# Patient Record
Sex: Female | Born: 1950 | Race: White | Hispanic: No | Marital: Married | Smoking: Never smoker
Health system: Southern US, Community
[De-identification: ages and names within clinical notes are randomized; demographics above are authoritative.]

## PROBLEM LIST (undated history)

## (undated) DIAGNOSIS — Z7952 Long term (current) use of systemic steroids: Secondary | ICD-10-CM

## (undated) DIAGNOSIS — M5136 Other intervertebral disc degeneration, lumbar region: Secondary | ICD-10-CM

## (undated) DIAGNOSIS — G629 Polyneuropathy, unspecified: Secondary | ICD-10-CM

## (undated) DIAGNOSIS — M51369 Other intervertebral disc degeneration, lumbar region without mention of lumbar back pain or lower extremity pain: Secondary | ICD-10-CM

## (undated) DIAGNOSIS — H9190 Unspecified hearing loss, unspecified ear: Secondary | ICD-10-CM

## (undated) DIAGNOSIS — T753XXA Motion sickness, initial encounter: Secondary | ICD-10-CM

## (undated) DIAGNOSIS — N390 Urinary tract infection, site not specified: Secondary | ICD-10-CM

## (undated) DIAGNOSIS — H8109 Meniere's disease, unspecified ear: Secondary | ICD-10-CM

## (undated) DIAGNOSIS — M1990 Unspecified osteoarthritis, unspecified site: Secondary | ICD-10-CM

## (undated) DIAGNOSIS — K654 Sclerosing mesenteritis: Secondary | ICD-10-CM

## (undated) DIAGNOSIS — M069 Rheumatoid arthritis, unspecified: Secondary | ICD-10-CM

## (undated) DIAGNOSIS — I10 Essential (primary) hypertension: Secondary | ICD-10-CM

## (undated) DIAGNOSIS — K5090 Crohn's disease, unspecified, without complications: Secondary | ICD-10-CM

## (undated) DIAGNOSIS — T884XXA Failed or difficult intubation, initial encounter: Secondary | ICD-10-CM

## (undated) DIAGNOSIS — D849 Immunodeficiency, unspecified: Secondary | ICD-10-CM

## (undated) DIAGNOSIS — E876 Hypokalemia: Secondary | ICD-10-CM

## (undated) DIAGNOSIS — M810 Age-related osteoporosis without current pathological fracture: Secondary | ICD-10-CM

## (undated) DIAGNOSIS — J189 Pneumonia, unspecified organism: Secondary | ICD-10-CM

## (undated) DIAGNOSIS — E119 Type 2 diabetes mellitus without complications: Secondary | ICD-10-CM

## (undated) DIAGNOSIS — N12 Tubulo-interstitial nephritis, not specified as acute or chronic: Secondary | ICD-10-CM

## (undated) DIAGNOSIS — I872 Venous insufficiency (chronic) (peripheral): Secondary | ICD-10-CM

## (undated) DIAGNOSIS — H905 Unspecified sensorineural hearing loss: Secondary | ICD-10-CM

## (undated) DIAGNOSIS — R519 Headache, unspecified: Secondary | ICD-10-CM

## (undated) DIAGNOSIS — F419 Anxiety disorder, unspecified: Secondary | ICD-10-CM

## (undated) DIAGNOSIS — I2510 Atherosclerotic heart disease of native coronary artery without angina pectoris: Secondary | ICD-10-CM

## (undated) DIAGNOSIS — G4733 Obstructive sleep apnea (adult) (pediatric): Secondary | ICD-10-CM

## (undated) DIAGNOSIS — D509 Iron deficiency anemia, unspecified: Secondary | ICD-10-CM

## (undated) DIAGNOSIS — F32A Depression, unspecified: Secondary | ICD-10-CM

## (undated) DIAGNOSIS — G5793 Unspecified mononeuropathy of bilateral lower limbs: Secondary | ICD-10-CM

## (undated) DIAGNOSIS — Z7960 Long term (current) use of unspecified immunomodulators and immunosuppressants: Secondary | ICD-10-CM

## (undated) DIAGNOSIS — M5030 Other cervical disc degeneration, unspecified cervical region: Secondary | ICD-10-CM

## (undated) DIAGNOSIS — R0609 Other forms of dyspnea: Secondary | ICD-10-CM

## (undated) DIAGNOSIS — G709 Myoneural disorder, unspecified: Secondary | ICD-10-CM

## (undated) DIAGNOSIS — Z79899 Other long term (current) drug therapy: Secondary | ICD-10-CM

## (undated) DIAGNOSIS — Z8659 Personal history of other mental and behavioral disorders: Secondary | ICD-10-CM

## (undated) DIAGNOSIS — K760 Fatty (change of) liver, not elsewhere classified: Secondary | ICD-10-CM

## (undated) DIAGNOSIS — E782 Mixed hyperlipidemia: Secondary | ICD-10-CM

## (undated) DIAGNOSIS — R0600 Dyspnea, unspecified: Secondary | ICD-10-CM

## (undated) DIAGNOSIS — Z87442 Personal history of urinary calculi: Secondary | ICD-10-CM

## (undated) DIAGNOSIS — R911 Solitary pulmonary nodule: Secondary | ICD-10-CM

## (undated) DIAGNOSIS — M359 Systemic involvement of connective tissue, unspecified: Secondary | ICD-10-CM

## (undated) DIAGNOSIS — D649 Anemia, unspecified: Secondary | ICD-10-CM

## (undated) DIAGNOSIS — R29898 Other symptoms and signs involving the musculoskeletal system: Secondary | ICD-10-CM

## (undated) DIAGNOSIS — S52539A Colles' fracture of unspecified radius, initial encounter for closed fracture: Secondary | ICD-10-CM

## (undated) DIAGNOSIS — R197 Diarrhea, unspecified: Secondary | ICD-10-CM

## (undated) DIAGNOSIS — M170 Bilateral primary osteoarthritis of knee: Secondary | ICD-10-CM

## (undated) DIAGNOSIS — G2581 Restless legs syndrome: Secondary | ICD-10-CM

## (undated) DIAGNOSIS — Z8619 Personal history of other infectious and parasitic diseases: Secondary | ICD-10-CM

## (undated) DIAGNOSIS — E0590 Thyrotoxicosis, unspecified without thyrotoxic crisis or storm: Secondary | ICD-10-CM

## (undated) DIAGNOSIS — H35341 Macular cyst, hole, or pseudohole, right eye: Secondary | ICD-10-CM

## (undated) DIAGNOSIS — I899 Noninfective disorder of lymphatic vessels and lymph nodes, unspecified: Secondary | ICD-10-CM

## (undated) DIAGNOSIS — R42 Dizziness and giddiness: Secondary | ICD-10-CM

## (undated) DIAGNOSIS — K219 Gastro-esophageal reflux disease without esophagitis: Secondary | ICD-10-CM

## (undated) DIAGNOSIS — E1140 Type 2 diabetes mellitus with diabetic neuropathy, unspecified: Secondary | ICD-10-CM

## (undated) DIAGNOSIS — Z972 Presence of dental prosthetic device (complete) (partial): Secondary | ICD-10-CM

## (undated) DIAGNOSIS — E538 Deficiency of other specified B group vitamins: Secondary | ICD-10-CM

## (undated) HISTORY — PX: KYPHOPLASTY: SHX5884

## (undated) HISTORY — PX: OOPHORECTOMY: SHX86

## (undated) HISTORY — DX: Anemia, unspecified: D64.9

## (undated) HISTORY — PX: ESOPHAGOGASTRODUODENOSCOPY: SHX1529

## (undated) HISTORY — PX: APPENDECTOMY: SHX54

## (undated) HISTORY — PX: CERVICAL SPINE SURGERY: SHX589

## (undated) HISTORY — PX: FRACTURE SURGERY: SHX138

## (undated) HISTORY — PX: WRIST FRACTURE SURGERY: SHX121

## (undated) HISTORY — PX: ABDOMINAL HYSTERECTOMY: SHX81

## (undated) HISTORY — PX: MEDIAL PARTIAL KNEE REPLACEMENT: SHX5965

## (undated) HISTORY — DX: Crohn's disease, unspecified, without complications: K50.90

## (undated) HISTORY — PX: OTHER SURGICAL HISTORY: SHX169

## (undated) HISTORY — PX: COLONOSCOPY: SHX174

## (undated) HISTORY — PX: TONSILLECTOMY: SUR1361

## (undated) HISTORY — PX: BACK SURGERY: SHX140

## (undated) HISTORY — PX: CHOLECYSTECTOMY: SHX55

## (undated) HISTORY — PX: UPPER GI ENDOSCOPY: SHX6162

---

## 1993-08-15 HISTORY — PX: TONSILLECTOMY: SUR1361

## 1997-08-15 HISTORY — PX: COLON SURGERY: SHX602

## 2000-11-30 ENCOUNTER — Ambulatory Visit (HOSPITAL_COMMUNITY): Admission: RE | Admit: 2000-11-30 | Discharge: 2000-11-30 | Payer: Self-pay | Admitting: Neurological Surgery

## 2000-11-30 ENCOUNTER — Encounter: Payer: Self-pay | Admitting: Neurological Surgery

## 2000-12-14 ENCOUNTER — Inpatient Hospital Stay (HOSPITAL_COMMUNITY): Admission: RE | Admit: 2000-12-14 | Discharge: 2000-12-15 | Payer: Self-pay | Admitting: Neurological Surgery

## 2000-12-14 ENCOUNTER — Encounter: Payer: Self-pay | Admitting: Neurological Surgery

## 2001-01-05 ENCOUNTER — Encounter: Admission: RE | Admit: 2001-01-05 | Discharge: 2001-01-05 | Payer: Self-pay | Admitting: Neurological Surgery

## 2001-01-05 ENCOUNTER — Encounter: Payer: Self-pay | Admitting: Neurological Surgery

## 2001-08-15 DIAGNOSIS — M5030 Other cervical disc degeneration, unspecified cervical region: Secondary | ICD-10-CM

## 2001-08-15 HISTORY — DX: Other cervical disc degeneration, unspecified cervical region: M50.30

## 2002-08-15 HISTORY — PX: DILATION AND CURETTAGE OF UTERUS: SHX78

## 2002-10-28 ENCOUNTER — Encounter: Admission: RE | Admit: 2002-10-28 | Discharge: 2002-10-28 | Payer: Self-pay | Admitting: Neurological Surgery

## 2002-10-28 ENCOUNTER — Encounter: Payer: Self-pay | Admitting: Neurological Surgery

## 2003-08-16 HISTORY — PX: ABDOMINAL HYSTERECTOMY: SHX81

## 2004-05-15 ENCOUNTER — Ambulatory Visit: Payer: Self-pay | Admitting: Internal Medicine

## 2004-06-15 ENCOUNTER — Ambulatory Visit: Payer: Self-pay | Admitting: Internal Medicine

## 2004-07-09 ENCOUNTER — Ambulatory Visit: Payer: Self-pay | Admitting: Rheumatology

## 2004-07-15 ENCOUNTER — Ambulatory Visit: Payer: Self-pay | Admitting: Internal Medicine

## 2004-08-15 ENCOUNTER — Ambulatory Visit: Payer: Self-pay | Admitting: Internal Medicine

## 2004-09-15 ENCOUNTER — Ambulatory Visit: Payer: Self-pay | Admitting: Internal Medicine

## 2004-10-13 ENCOUNTER — Ambulatory Visit: Payer: Self-pay | Admitting: Internal Medicine

## 2004-11-13 ENCOUNTER — Ambulatory Visit: Payer: Self-pay | Admitting: Internal Medicine

## 2004-12-13 ENCOUNTER — Ambulatory Visit: Payer: Self-pay | Admitting: Internal Medicine

## 2005-01-13 ENCOUNTER — Ambulatory Visit: Payer: Self-pay | Admitting: Internal Medicine

## 2005-02-12 ENCOUNTER — Ambulatory Visit: Payer: Self-pay | Admitting: Internal Medicine

## 2005-02-25 ENCOUNTER — Ambulatory Visit: Payer: Self-pay | Admitting: Otolaryngology

## 2005-03-15 ENCOUNTER — Ambulatory Visit: Payer: Self-pay | Admitting: Internal Medicine

## 2005-04-15 ENCOUNTER — Ambulatory Visit: Payer: Self-pay | Admitting: Internal Medicine

## 2005-05-15 ENCOUNTER — Ambulatory Visit: Payer: Self-pay | Admitting: Internal Medicine

## 2005-06-15 ENCOUNTER — Ambulatory Visit: Payer: Self-pay | Admitting: Internal Medicine

## 2005-07-11 ENCOUNTER — Ambulatory Visit: Payer: Self-pay

## 2005-07-15 ENCOUNTER — Ambulatory Visit: Payer: Self-pay | Admitting: Internal Medicine

## 2005-08-15 ENCOUNTER — Ambulatory Visit: Payer: Self-pay | Admitting: Internal Medicine

## 2005-09-15 ENCOUNTER — Ambulatory Visit: Payer: Self-pay | Admitting: Internal Medicine

## 2005-10-14 ENCOUNTER — Ambulatory Visit: Payer: Self-pay | Admitting: Unknown Physician Specialty

## 2005-10-15 ENCOUNTER — Ambulatory Visit: Payer: Self-pay | Admitting: Internal Medicine

## 2005-11-13 ENCOUNTER — Ambulatory Visit: Payer: Self-pay | Admitting: Internal Medicine

## 2005-12-13 ENCOUNTER — Ambulatory Visit: Payer: Self-pay | Admitting: Internal Medicine

## 2006-01-04 ENCOUNTER — Ambulatory Visit: Payer: Self-pay

## 2006-01-11 ENCOUNTER — Ambulatory Visit: Payer: Self-pay | Admitting: Internal Medicine

## 2006-01-12 ENCOUNTER — Ambulatory Visit: Payer: Self-pay | Admitting: Internal Medicine

## 2006-01-13 ENCOUNTER — Ambulatory Visit: Payer: Self-pay | Admitting: Internal Medicine

## 2006-02-12 ENCOUNTER — Ambulatory Visit: Payer: Self-pay | Admitting: Internal Medicine

## 2006-03-15 ENCOUNTER — Ambulatory Visit: Payer: Self-pay | Admitting: Internal Medicine

## 2006-04-15 ENCOUNTER — Ambulatory Visit: Payer: Self-pay | Admitting: Internal Medicine

## 2006-05-15 ENCOUNTER — Ambulatory Visit: Payer: Self-pay | Admitting: Internal Medicine

## 2006-06-15 ENCOUNTER — Ambulatory Visit: Payer: Self-pay | Admitting: Internal Medicine

## 2006-07-15 ENCOUNTER — Ambulatory Visit: Payer: Self-pay | Admitting: Internal Medicine

## 2006-07-18 ENCOUNTER — Ambulatory Visit: Payer: Self-pay | Admitting: Urology

## 2006-08-15 ENCOUNTER — Ambulatory Visit: Payer: Self-pay | Admitting: Internal Medicine

## 2006-08-15 HISTORY — PX: ELBOW FRACTURE SURGERY: SHX616

## 2006-09-20 ENCOUNTER — Ambulatory Visit: Payer: Self-pay | Admitting: Unknown Physician Specialty

## 2006-09-27 ENCOUNTER — Ambulatory Visit: Payer: Self-pay | Admitting: Unknown Physician Specialty

## 2006-10-11 ENCOUNTER — Ambulatory Visit: Payer: Self-pay | Admitting: Internal Medicine

## 2006-11-01 ENCOUNTER — Ambulatory Visit: Payer: Self-pay | Admitting: Unknown Physician Specialty

## 2006-11-07 ENCOUNTER — Ambulatory Visit: Payer: Self-pay | Admitting: Internal Medicine

## 2006-11-20 ENCOUNTER — Ambulatory Visit: Payer: Self-pay | Admitting: Unknown Physician Specialty

## 2006-11-28 ENCOUNTER — Ambulatory Visit: Payer: Self-pay | Admitting: Unknown Physician Specialty

## 2006-12-05 ENCOUNTER — Ambulatory Visit: Payer: Self-pay | Admitting: Internal Medicine

## 2006-12-14 ENCOUNTER — Ambulatory Visit: Payer: Self-pay | Admitting: Internal Medicine

## 2007-01-22 ENCOUNTER — Ambulatory Visit: Payer: Self-pay | Admitting: Urology

## 2007-01-30 ENCOUNTER — Ambulatory Visit: Payer: Self-pay | Admitting: Internal Medicine

## 2007-02-13 ENCOUNTER — Ambulatory Visit: Payer: Self-pay | Admitting: Internal Medicine

## 2007-03-16 ENCOUNTER — Ambulatory Visit: Payer: Self-pay | Admitting: Internal Medicine

## 2007-04-16 ENCOUNTER — Ambulatory Visit: Payer: Self-pay | Admitting: Internal Medicine

## 2007-05-16 ENCOUNTER — Ambulatory Visit: Payer: Self-pay | Admitting: Internal Medicine

## 2007-06-16 ENCOUNTER — Ambulatory Visit: Payer: Self-pay | Admitting: Internal Medicine

## 2007-07-16 ENCOUNTER — Ambulatory Visit: Payer: Self-pay | Admitting: Internal Medicine

## 2007-08-22 ENCOUNTER — Ambulatory Visit: Payer: Self-pay | Admitting: Urology

## 2007-08-29 ENCOUNTER — Ambulatory Visit: Payer: Self-pay | Admitting: Urology

## 2007-09-11 ENCOUNTER — Ambulatory Visit: Payer: Self-pay | Admitting: Internal Medicine

## 2007-09-25 ENCOUNTER — Inpatient Hospital Stay: Payer: Self-pay | Admitting: Unknown Physician Specialty

## 2007-10-10 ENCOUNTER — Ambulatory Visit: Payer: Self-pay | Admitting: Internal Medicine

## 2007-10-14 ENCOUNTER — Ambulatory Visit: Payer: Self-pay | Admitting: Internal Medicine

## 2007-11-28 ENCOUNTER — Ambulatory Visit: Payer: Self-pay | Admitting: Unknown Physician Specialty

## 2007-12-04 ENCOUNTER — Ambulatory Visit: Payer: Self-pay | Admitting: Internal Medicine

## 2007-12-14 ENCOUNTER — Ambulatory Visit: Payer: Self-pay | Admitting: Internal Medicine

## 2008-01-22 ENCOUNTER — Ambulatory Visit: Payer: Self-pay | Admitting: Urology

## 2008-02-05 ENCOUNTER — Ambulatory Visit: Payer: Self-pay | Admitting: Internal Medicine

## 2008-02-13 ENCOUNTER — Ambulatory Visit: Payer: Self-pay | Admitting: Internal Medicine

## 2008-03-04 ENCOUNTER — Ambulatory Visit: Payer: Self-pay | Admitting: Internal Medicine

## 2008-03-15 ENCOUNTER — Ambulatory Visit: Payer: Self-pay | Admitting: Internal Medicine

## 2008-04-15 ENCOUNTER — Ambulatory Visit: Payer: Self-pay | Admitting: Internal Medicine

## 2008-04-29 ENCOUNTER — Ambulatory Visit: Payer: Self-pay | Admitting: Internal Medicine

## 2008-05-14 ENCOUNTER — Ambulatory Visit: Payer: Self-pay | Admitting: Internal Medicine

## 2008-05-15 ENCOUNTER — Ambulatory Visit: Payer: Self-pay | Admitting: Internal Medicine

## 2008-06-15 ENCOUNTER — Ambulatory Visit: Payer: Self-pay | Admitting: Internal Medicine

## 2008-07-15 ENCOUNTER — Ambulatory Visit: Payer: Self-pay | Admitting: Internal Medicine

## 2008-08-15 ENCOUNTER — Ambulatory Visit: Payer: Self-pay | Admitting: Internal Medicine

## 2008-09-15 ENCOUNTER — Ambulatory Visit: Payer: Self-pay | Admitting: Internal Medicine

## 2008-10-13 ENCOUNTER — Ambulatory Visit: Payer: Self-pay | Admitting: Internal Medicine

## 2008-11-28 ENCOUNTER — Ambulatory Visit: Payer: Self-pay | Admitting: Unknown Physician Specialty

## 2008-12-09 ENCOUNTER — Ambulatory Visit: Payer: Self-pay | Admitting: Internal Medicine

## 2008-12-13 ENCOUNTER — Ambulatory Visit: Payer: Self-pay | Admitting: Internal Medicine

## 2009-01-02 ENCOUNTER — Ambulatory Visit: Payer: Self-pay | Admitting: Unknown Physician Specialty

## 2009-01-06 ENCOUNTER — Ambulatory Visit: Payer: Self-pay | Admitting: Internal Medicine

## 2009-01-12 ENCOUNTER — Ambulatory Visit: Payer: Self-pay | Admitting: Unknown Physician Specialty

## 2009-01-13 ENCOUNTER — Ambulatory Visit: Payer: Self-pay | Admitting: Internal Medicine

## 2009-01-22 ENCOUNTER — Ambulatory Visit: Payer: Self-pay | Admitting: Urology

## 2009-02-05 ENCOUNTER — Ambulatory Visit: Payer: Self-pay | Admitting: Internal Medicine

## 2009-02-10 ENCOUNTER — Ambulatory Visit: Payer: Self-pay | Admitting: Internal Medicine

## 2009-02-12 ENCOUNTER — Ambulatory Visit: Payer: Self-pay | Admitting: Internal Medicine

## 2009-03-15 ENCOUNTER — Ambulatory Visit: Payer: Self-pay | Admitting: Internal Medicine

## 2009-04-13 ENCOUNTER — Ambulatory Visit: Payer: Self-pay | Admitting: Internal Medicine

## 2009-05-06 ENCOUNTER — Ambulatory Visit: Payer: Self-pay | Admitting: Internal Medicine

## 2009-05-13 ENCOUNTER — Ambulatory Visit: Payer: Self-pay | Admitting: Internal Medicine

## 2009-06-03 ENCOUNTER — Ambulatory Visit: Payer: Self-pay | Admitting: Internal Medicine

## 2009-06-09 ENCOUNTER — Ambulatory Visit: Payer: Self-pay | Admitting: Internal Medicine

## 2009-06-15 ENCOUNTER — Ambulatory Visit: Payer: Self-pay | Admitting: Internal Medicine

## 2009-07-15 ENCOUNTER — Ambulatory Visit: Payer: Self-pay | Admitting: Internal Medicine

## 2009-08-15 ENCOUNTER — Ambulatory Visit: Payer: Self-pay | Admitting: Internal Medicine

## 2009-09-05 ENCOUNTER — Emergency Department: Payer: Self-pay | Admitting: Emergency Medicine

## 2009-09-10 ENCOUNTER — Ambulatory Visit: Payer: Self-pay | Admitting: Internal Medicine

## 2009-09-15 ENCOUNTER — Ambulatory Visit: Payer: Self-pay | Admitting: Internal Medicine

## 2009-10-13 ENCOUNTER — Ambulatory Visit: Payer: Self-pay | Admitting: Internal Medicine

## 2009-12-02 ENCOUNTER — Ambulatory Visit: Payer: Self-pay | Admitting: Unknown Physician Specialty

## 2009-12-08 ENCOUNTER — Ambulatory Visit: Payer: Self-pay | Admitting: Internal Medicine

## 2009-12-13 ENCOUNTER — Ambulatory Visit: Payer: Self-pay | Admitting: Internal Medicine

## 2010-01-02 ENCOUNTER — Encounter: Admission: RE | Admit: 2010-01-02 | Discharge: 2010-01-02 | Payer: Self-pay | Admitting: Neurological Surgery

## 2010-01-13 ENCOUNTER — Ambulatory Visit: Payer: Self-pay | Admitting: Internal Medicine

## 2010-01-26 ENCOUNTER — Ambulatory Visit: Payer: Self-pay | Admitting: Urology

## 2010-01-27 ENCOUNTER — Ambulatory Visit: Payer: Self-pay | Admitting: Pain Medicine

## 2010-02-04 ENCOUNTER — Ambulatory Visit: Payer: Self-pay | Admitting: Pain Medicine

## 2010-02-22 ENCOUNTER — Ambulatory Visit: Payer: Self-pay | Admitting: Pain Medicine

## 2010-11-05 ENCOUNTER — Ambulatory Visit: Payer: Self-pay | Admitting: Urology

## 2010-11-09 ENCOUNTER — Ambulatory Visit: Payer: Self-pay | Admitting: Urology

## 2010-12-06 ENCOUNTER — Ambulatory Visit: Payer: Self-pay | Admitting: Unknown Physician Specialty

## 2010-12-31 NOTE — Op Note (Signed)
[place]  Patient:    Nicole Bass, Nicole Bass                       MRN: [id number] Proc. Date: 12/14/00 Adm. Date:  [id number] Attending:  Clearnce Sorrel                           Operative Report  PREOPERATIVE DIAGNOSES:  C5-6 cervical spondylosis and herniated nucleus pulposus C6-7 with right cervical radiculopathy.  POSTOPERATIVE DIAGNOSES:  C5-6 cervical spondylosis and herniated nucleus pulposus C6-7 with right cervical radiculopathy.  PROCEDURE:  Anterior cervical diskectomy and arthrodesis with structural allografts Synthes fixation C5-6 and C6-7.  SURGEON:  Earleen Newport, M.D.  FIRST ASSISTANT:  Marchia Meiers. Vertell Limber, M.D..  ANESTHESIA:  General endotracheal.  INDICATIONS:  The patient is a 60 year old individual who has had significant neck, shoulder and arm pain on the left hand side.  She has been suffering with this for the past 5 to 6 months and she was advised regarding anterior diskectomy surgery and arthrodesis.  She is taken to the operating room.  DESCRIPTION OF PROCEDURE:  The patient was brought to the operating room, placed on the table in the supine position.  After the smooth induction of general endotracheal anesthesia, her neck was placed in five pounds of Holter traction and was prepped with duraprep and draped in a sterile fashion.  A transverse incision was made on the the left side of the neck at the lower skin fold.  Dissection was taken down through the platysma and the plane between the sternocleidomastoid and the strap muscles was dissected bluntly until the prevertebral space was reached.  The first identifiable disk space was noted to be that of C4-5 and dissection was carried inferiorly to expose C5-6 and C6-7.  A self-retaining Caspar retractor was placed in the wound.  Diskectomy at C5-6 was undertaken and near the disk and was markedly degenerated.  Small uncinate spurs were found, somewhat greater on the  right then on the left.  Subligamentous disk herniation was encountered in this region and this was resected.  The posterolongitudinal ligament was opened from side to side and lateral recesses were decompressed.  In the end a 7 mm round fibular graft was packed with some of the patients own bone and then placed into the interspace.  Attention was turned to C6-7. Here the disk space was noted to be much more sclerotic and the disk much more degenerated and flattened.  Lateral recesses were also quite overgrown and the uncinate processes were quite enlarged.  These were resected with an anspach drill and a 2.3 mm dissecting tool.  The lateral recesses, once decompressed, particularly on the right allowed good egress of the C7 nerve root on this side.  Then a 7 mm round fibular graft was packed with the patients own bone again. The bone was harvested from the uncinate spurs that were resected on either side and placed into the interspace.  Traction was removed and the neck was placed in slight flexion.  A 37 mm small statured plate was then affixed with 6 locking 4 x 14 mm screws. Hemostasis from the soft tissue was obtained. Radiographic confirmation of the placement of the hardware was obtained and the platysma was closed with 3-0 Vicryl in an interrupted fashion.  3-0 Vicryl was used subcuticularly.  Duraprep was used on the skin.  At the closure of the procedure  several small skin tags that the patient had in the region of the neck and wished to be removed were removed with a Metzenbaum scissors and these were similarly dressed. DD:  12/14/00 TD:  12/15/00 Job: [id number]

## 2011-02-24 ENCOUNTER — Ambulatory Visit: Payer: Self-pay | Admitting: Urology

## 2011-04-13 ENCOUNTER — Other Ambulatory Visit: Payer: Self-pay | Admitting: Unknown Physician Specialty

## 2011-08-09 ENCOUNTER — Emergency Department: Payer: Self-pay | Admitting: Emergency Medicine

## 2011-08-16 HISTORY — PX: TOE SURGERY: SHX1073

## 2011-08-24 LAB — CBC
HCT: 34 % — ABNORMAL LOW (ref 35.0–47.0)
HGB: 11.4 g/dL — ABNORMAL LOW (ref 12.0–16.0)
MCH: 30.2 pg (ref 26.0–34.0)
MCHC: 33.6 g/dL (ref 32.0–36.0)
MCV: 90 fL (ref 80–100)
Platelet: 264 10*3/uL (ref 150–440)
RBC: 3.77 10*6/uL — ABNORMAL LOW (ref 3.80–5.20)
RDW: 14.6 % — ABNORMAL HIGH (ref 11.5–14.5)
WBC: 9.1 10*3/uL (ref 3.6–11.0)

## 2011-08-24 LAB — COMPREHENSIVE METABOLIC PANEL
Albumin: 3.3 g/dL — ABNORMAL LOW (ref 3.4–5.0)
Alkaline Phosphatase: 83 U/L (ref 50–136)
Anion Gap: 12 (ref 7–16)
Bilirubin,Total: 0.5 mg/dL (ref 0.2–1.0)
Co2: 25 mmol/L (ref 21–32)
Creatinine: 0.86 mg/dL (ref 0.60–1.30)
EGFR (Non-African Amer.): >60
Glucose: 121 mg/dL — ABNORMAL HIGH (ref 65–99)
Osmolality: 279 (ref 275–301)
Potassium: 3.4 mmol/L — ABNORMAL LOW (ref 3.5–5.1)
SGPT (ALT): 18 U/L
Sodium: 140 mmol/L (ref 136–145)

## 2011-08-24 LAB — URINALYSIS, COMPLETE
Bacteria: NONE SEEN
Bilirubin,UR: NEGATIVE
Blood: NEGATIVE
Glucose,UR: NEGATIVE mg/dL (ref 0–75)
Ketone: NEGATIVE
Leukocyte Esterase: NEGATIVE
Specific Gravity: 1.018 (ref 1.003–1.030)
Squamous Epithelial: <1
WBC UR: 1 /HPF (ref 0–5)

## 2011-08-25 ENCOUNTER — Inpatient Hospital Stay: Payer: Self-pay | Admitting: Internal Medicine

## 2011-08-25 LAB — CLOSTRIDIUM DIFFICILE BY PCR

## 2011-08-25 LAB — SEDIMENTATION RATE: Erythrocyte Sed Rate: 64 mm/hr — ABNORMAL HIGH (ref 0–30)

## 2011-08-25 LAB — WBCS, STOOL

## 2011-08-26 LAB — COMPREHENSIVE METABOLIC PANEL
Albumin: 2.4 g/dL — ABNORMAL LOW (ref 3.4–5.0)
Alkaline Phosphatase: 58 U/L (ref 50–136)
BUN: 2 mg/dL — ABNORMAL LOW (ref 7–18)
Bilirubin,Total: 0.3 mg/dL (ref 0.2–1.0)
Creatinine: 0.76 mg/dL (ref 0.60–1.30)
EGFR (Non-African Amer.): >60
Glucose: 90 mg/dL (ref 65–99)
Osmolality: 286 (ref 275–301)
SGPT (ALT): 14 U/L
Sodium: 146 mmol/L — ABNORMAL HIGH (ref 136–145)
Total Protein: 5.4 g/dL — ABNORMAL LOW (ref 6.4–8.2)

## 2011-08-26 LAB — CBC WITH DIFFERENTIAL/PLATELET
Basophil #: 0 10*3/uL (ref 0.0–0.1)
Eosinophil %: 2.8 %
Lymphocyte #: 0.6 10*3/uL — ABNORMAL LOW (ref 1.0–3.6)
Lymphocyte %: 16.8 %
MCH: 30.3 pg (ref 26.0–34.0)
MCHC: 33.1 g/dL (ref 32.0–36.0)
MCV: 92 fL (ref 80–100)
Monocyte #: 0.4 10*3/uL (ref 0.0–0.7)
Neutrophil %: 68.6 %
Platelet: 158 10*3/uL (ref 150–440)
RBC: 2.81 10*6/uL — ABNORMAL LOW (ref 3.80–5.20)

## 2011-08-27 LAB — COMPREHENSIVE METABOLIC PANEL
Anion Gap: 11 (ref 7–16)
Bilirubin,Total: 0.3 mg/dL (ref 0.2–1.0)
Calcium, Total: 7.6 mg/dL — ABNORMAL LOW (ref 8.5–10.1)
Chloride: 110 mmol/L — ABNORMAL HIGH (ref 98–107)
Co2: 25 mmol/L (ref 21–32)
Creatinine: 0.7 mg/dL (ref 0.60–1.30)
EGFR (African American): >60
EGFR (Non-African Amer.): >60
SGOT(AST): 24 U/L (ref 15–37)
SGPT (ALT): 15 U/L
Sodium: 146 mmol/L — ABNORMAL HIGH (ref 136–145)

## 2011-08-27 LAB — CBC WITH DIFFERENTIAL/PLATELET
Eosinophil #: 0.1 10*3/uL (ref 0.0–0.7)
HCT: 26.8 % — ABNORMAL LOW (ref 35.0–47.0)
HGB: 8.9 g/dL — ABNORMAL LOW (ref 12.0–16.0)
Lymphocyte #: 1 10*3/uL (ref 1.0–3.6)
Lymphocyte %: 24.7 %
MCH: 30.3 pg (ref 26.0–34.0)
MCHC: 33.2 g/dL (ref 32.0–36.0)
Monocyte #: 0.5 10*3/uL (ref 0.0–0.7)
Monocyte %: 12.6 %
Neutrophil %: 59.5 %
Platelet: 157 10*3/uL (ref 150–440)
RDW: 14.2 % (ref 11.5–14.5)
WBC: 4 10*3/uL (ref 3.6–11.0)

## 2011-08-27 LAB — STOOL CULTURE

## 2011-08-29 LAB — CBC WITH DIFFERENTIAL/PLATELET
Basophil #: 0 10*3/uL (ref 0.0–0.1)
Eosinophil #: 0.2 10*3/uL (ref 0.0–0.7)
HGB: 9.2 g/dL — ABNORMAL LOW (ref 12.0–16.0)
MCH: 29.9 pg (ref 26.0–34.0)
MCHC: 32.8 g/dL (ref 32.0–36.0)
MCV: 91 fL (ref 80–100)
Monocyte #: 0.4 10*3/uL (ref 0.0–0.7)
Neutrophil %: 43.9 %
Platelet: 201 10*3/uL (ref 150–440)
RBC: 3.07 10*6/uL — ABNORMAL LOW (ref 3.80–5.20)
RDW: 13.6 % (ref 11.5–14.5)

## 2011-08-29 LAB — BASIC METABOLIC PANEL
Anion Gap: 12 (ref 7–16)
BUN: 1 mg/dL — ABNORMAL LOW (ref 7–18)
Calcium, Total: 8.2 mg/dL — ABNORMAL LOW (ref 8.5–10.1)
Co2: 26 mmol/L (ref 21–32)
EGFR (African American): >60
EGFR (Non-African Amer.): >60
Glucose: 91 mg/dL (ref 65–99)
Osmolality: 288 (ref 275–301)

## 2011-08-30 LAB — COMPREHENSIVE METABOLIC PANEL
Albumin: 2.9 g/dL — ABNORMAL LOW (ref 3.4–5.0)
Alkaline Phosphatase: 65 U/L (ref 50–136)
Anion Gap: 12 (ref 7–16)
Bilirubin,Total: 0.2 mg/dL (ref 0.2–1.0)
Calcium, Total: 8.5 mg/dL (ref 8.5–10.1)
Creatinine: 0.8 mg/dL (ref 0.60–1.30)
Glucose: 86 mg/dL (ref 65–99)
Osmolality: 288 (ref 275–301)
Potassium: 3.8 mmol/L (ref 3.5–5.1)
SGOT(AST): 25 U/L (ref 15–37)
Sodium: 147 mmol/L — ABNORMAL HIGH (ref 136–145)
Total Protein: 6.5 g/dL (ref 6.4–8.2)

## 2011-08-30 LAB — CBC WITH DIFFERENTIAL/PLATELET
Basophil %: 0.4 %
Eosinophil #: 0.2 10*3/uL (ref 0.0–0.7)
Eosinophil %: 5.4 %
HCT: 28.4 % — ABNORMAL LOW (ref 35.0–47.0)
HGB: 9.3 g/dL — ABNORMAL LOW (ref 12.0–16.0)
Lymphocyte %: 35.1 %
MCHC: 32.7 g/dL (ref 32.0–36.0)
Neutrophil #: 2 10*3/uL (ref 1.4–6.5)
Neutrophil %: 46.2 %
Platelet: 211 10*3/uL (ref 150–440)
RBC: 3.14 10*6/uL — ABNORMAL LOW (ref 3.80–5.20)

## 2011-09-07 ENCOUNTER — Other Ambulatory Visit: Payer: Self-pay | Admitting: Unknown Physician Specialty

## 2011-09-07 LAB — CLOSTRIDIUM DIFFICILE BY PCR

## 2011-10-04 ENCOUNTER — Emergency Department: Payer: Self-pay | Admitting: *Deleted

## 2012-02-08 ENCOUNTER — Ambulatory Visit: Payer: Self-pay | Admitting: Unknown Physician Specialty

## 2012-06-04 ENCOUNTER — Ambulatory Visit: Payer: Self-pay | Admitting: Internal Medicine

## 2012-09-26 LAB — CBC
HCT: 39.4 % (ref 35.0–47.0)
HGB: 13.1 g/dL (ref 12.0–16.0)
MCH: 30.7 pg (ref 26.0–34.0)
MCV: 93 fL (ref 80–100)
RBC: 4.26 10*6/uL (ref 3.80–5.20)
RDW: 15.5 % — ABNORMAL HIGH (ref 11.5–14.5)
WBC: 11.1 10*3/uL — ABNORMAL HIGH (ref 3.6–11.0)

## 2012-09-26 LAB — COMPREHENSIVE METABOLIC PANEL
Albumin: 3.9 g/dL (ref 3.4–5.0)
Alkaline Phosphatase: 109 U/L (ref 50–136)
Anion Gap: 8 (ref 7–16)
BUN: 14 mg/dL (ref 7–18)
Bilirubin,Total: 0.5 mg/dL (ref 0.2–1.0)
Co2: 25 mmol/L (ref 21–32)
Creatinine: 1.21 mg/dL (ref 0.60–1.30)
Glucose: 154 mg/dL — ABNORMAL HIGH (ref 65–99)
Osmolality: 283 (ref 275–301)
Sodium: 140 mmol/L (ref 136–145)

## 2012-09-26 LAB — LIPASE, BLOOD: Lipase: 149 U/L (ref 73–393)

## 2012-09-27 ENCOUNTER — Inpatient Hospital Stay: Payer: Self-pay

## 2012-09-27 LAB — URINALYSIS, COMPLETE
Bacteria: NONE SEEN
Bilirubin,UR: NEGATIVE
Blood: NEGATIVE
Nitrite: NEGATIVE
RBC,UR: <1 /HPF (ref 0–5)
Squamous Epithelial: <1
WBC UR: 1 /HPF (ref 0–5)

## 2012-09-28 LAB — CBC WITH DIFFERENTIAL/PLATELET
Eosinophil %: 0.4 %
HCT: 30.4 % — ABNORMAL LOW (ref 35.0–47.0)
HGB: 10 g/dL — ABNORMAL LOW (ref 12.0–16.0)
MCV: 93 fL (ref 80–100)
Monocyte %: 4.3 %
Neutrophil #: 7.4 10*3/uL — ABNORMAL HIGH (ref 1.4–6.5)
Neutrophil %: 88.7 %
RBC: 3.27 10*6/uL — ABNORMAL LOW (ref 3.80–5.20)
RDW: 15.5 % — ABNORMAL HIGH (ref 11.5–14.5)
WBC: 8.3 10*3/uL (ref 3.6–11.0)

## 2012-09-28 LAB — COMPREHENSIVE METABOLIC PANEL
Anion Gap: 7 (ref 7–16)
BUN: 9 mg/dL (ref 7–18)
Bilirubin,Total: 0.2 mg/dL (ref 0.2–1.0)
Calcium, Total: 8.8 mg/dL (ref 8.5–10.1)
Chloride: 112 mmol/L — ABNORMAL HIGH (ref 98–107)
Co2: 24 mmol/L (ref 21–32)
EGFR (Non-African Amer.): >60
Osmolality: 286 (ref 275–301)
Potassium: 4.1 mmol/L (ref 3.5–5.1)
SGOT(AST): 26 U/L (ref 15–37)
SGPT (ALT): 21 U/L (ref 12–78)
Total Protein: 6.4 g/dL (ref 6.4–8.2)

## 2012-09-29 LAB — CBC WITH DIFFERENTIAL/PLATELET
Basophil %: 0.1 %
Eosinophil #: 0 10*3/uL (ref 0.0–0.7)
Eosinophil %: 0 %
HGB: 9.2 g/dL — ABNORMAL LOW (ref 12.0–16.0)
Lymphocyte #: 0.5 10*3/uL — ABNORMAL LOW (ref 1.0–3.6)
Lymphocyte %: 6.9 %
MCH: 29.5 pg (ref 26.0–34.0)
Monocyte %: 3.4 %
Neutrophil #: 6.7 10*3/uL — ABNORMAL HIGH (ref 1.4–6.5)
RBC: 3.1 10*6/uL — ABNORMAL LOW (ref 3.80–5.20)
RDW: 15.4 % — ABNORMAL HIGH (ref 11.5–14.5)

## 2012-09-29 LAB — COMPREHENSIVE METABOLIC PANEL
Albumin: 2.9 g/dL — ABNORMAL LOW (ref 3.4–5.0)
Anion Gap: 8 (ref 7–16)
BUN: 9 mg/dL (ref 7–18)
Bilirubin,Total: 0.2 mg/dL (ref 0.2–1.0)
Chloride: 110 mmol/L — ABNORMAL HIGH (ref 98–107)
Creatinine: 0.72 mg/dL (ref 0.60–1.30)
EGFR (African American): >60
Osmolality: 286 (ref 275–301)
Potassium: 3.7 mmol/L (ref 3.5–5.1)
Total Protein: 6.2 g/dL — ABNORMAL LOW (ref 6.4–8.2)

## 2012-10-24 ENCOUNTER — Ambulatory Visit: Payer: Self-pay | Admitting: Internal Medicine

## 2012-11-06 ENCOUNTER — Ambulatory Visit: Payer: Self-pay

## 2012-11-26 ENCOUNTER — Ambulatory Visit: Payer: Self-pay | Admitting: Unknown Physician Specialty

## 2012-11-28 LAB — PATHOLOGY REPORT

## 2012-12-12 ENCOUNTER — Emergency Department: Payer: Self-pay | Admitting: Emergency Medicine

## 2012-12-25 ENCOUNTER — Ambulatory Visit: Payer: Self-pay | Admitting: Anesthesiology

## 2012-12-27 ENCOUNTER — Ambulatory Visit: Payer: Self-pay | Admitting: Podiatry

## 2012-12-27 HISTORY — PX: HALLUX VALGUS CORRECTION: SUR315

## 2013-06-20 ENCOUNTER — Ambulatory Visit: Payer: Self-pay | Admitting: Internal Medicine

## 2013-07-16 DIAGNOSIS — R109 Unspecified abdominal pain: Secondary | ICD-10-CM | POA: Insufficient documentation

## 2013-07-16 DIAGNOSIS — N209 Urinary calculus, unspecified: Secondary | ICD-10-CM | POA: Insufficient documentation

## 2013-08-15 HISTORY — PX: JOINT REPLACEMENT: SHX530

## 2013-10-19 ENCOUNTER — Emergency Department: Payer: Self-pay | Admitting: Emergency Medicine

## 2013-10-19 LAB — CBC WITH DIFFERENTIAL/PLATELET
BASOS PCT: 0.7 %
Basophil #: 0.1 10*3/uL (ref 0.0–0.1)
Eosinophil #: 0 10*3/uL (ref 0.0–0.7)
Eosinophil %: 0.1 %
HCT: 32.8 % — AB (ref 35.0–47.0)
HGB: 11 g/dL — ABNORMAL LOW (ref 12.0–16.0)
LYMPHS ABS: 1.1 10*3/uL (ref 1.0–3.6)
LYMPHS PCT: 6 %
MCH: 28.9 pg (ref 26.0–34.0)
MCHC: 33.4 g/dL (ref 32.0–36.0)
MCV: 87 fL (ref 80–100)
MONO ABS: 1.2 x10 3/mm — AB (ref 0.2–0.9)
Monocyte %: 6.5 %
NEUTROS ABS: 16.2 10*3/uL — AB (ref 1.4–6.5)
Neutrophil %: 86.7 %
Platelet: 260 10*3/uL (ref 150–440)
RBC: 3.79 10*6/uL — ABNORMAL LOW (ref 3.80–5.20)
RDW: 18.7 % — ABNORMAL HIGH (ref 11.5–14.5)
WBC: 18.7 10*3/uL — ABNORMAL HIGH (ref 3.6–11.0)

## 2013-10-19 LAB — COMPREHENSIVE METABOLIC PANEL
ALBUMIN: 3.2 g/dL — AB (ref 3.4–5.0)
Alkaline Phosphatase: 94 U/L
Anion Gap: 8 (ref 7–16)
BILIRUBIN TOTAL: 0.7 mg/dL (ref 0.2–1.0)
BUN: 9 mg/dL (ref 7–18)
CALCIUM: 9.2 mg/dL (ref 8.5–10.1)
Chloride: 95 mmol/L — ABNORMAL LOW (ref 98–107)
Co2: 32 mmol/L (ref 21–32)
Creatinine: 1.04 mg/dL (ref 0.60–1.30)
EGFR (African American): >60
GFR CALC NON AF AMER: 58 — AB
GLUCOSE: 126 mg/dL — AB (ref 65–99)
Osmolality: 270 (ref 275–301)
Potassium: 2.6 mmol/L — ABNORMAL LOW (ref 3.5–5.1)
SGOT(AST): 23 U/L (ref 15–37)
SGPT (ALT): 29 U/L (ref 12–78)
Sodium: 135 mmol/L — ABNORMAL LOW (ref 136–145)
Total Protein: 7.6 g/dL (ref 6.4–8.2)

## 2013-10-19 LAB — URINALYSIS, COMPLETE
BACTERIA: NONE SEEN
BILIRUBIN, UR: NEGATIVE
BLOOD: NEGATIVE
Glucose,UR: NEGATIVE mg/dL (ref 0–75)
Ketone: NEGATIVE
Nitrite: NEGATIVE
PH: 7 (ref 4.5–8.0)
Protein: NEGATIVE
SPECIFIC GRAVITY: 1.009 (ref 1.003–1.030)
Squamous Epithelial: 1

## 2013-10-19 LAB — LIPASE, BLOOD: LIPASE: 192 U/L (ref 73–393)

## 2013-10-19 LAB — MAGNESIUM: MAGNESIUM: 0.9 mg/dL — AB

## 2013-11-25 ENCOUNTER — Emergency Department: Payer: Self-pay | Admitting: Emergency Medicine

## 2013-11-25 LAB — CBC WITH DIFFERENTIAL/PLATELET
BASOS PCT: 0.8 %
Basophil #: 0.1 10*3/uL (ref 0.0–0.1)
EOS PCT: 1 %
Eosinophil #: 0.1 10*3/uL (ref 0.0–0.7)
HCT: 34.1 % — ABNORMAL LOW (ref 35.0–47.0)
HGB: 11.2 g/dL — ABNORMAL LOW (ref 12.0–16.0)
Lymphocyte #: 1 10*3/uL (ref 1.0–3.6)
Lymphocyte %: 13.1 %
MCH: 28.6 pg (ref 26.0–34.0)
MCHC: 32.9 g/dL (ref 32.0–36.0)
MCV: 87 fL (ref 80–100)
MONOS PCT: 14.7 %
Monocyte #: 1.1 x10 3/mm — ABNORMAL HIGH (ref 0.2–0.9)
NEUTROS ABS: 5.2 10*3/uL (ref 1.4–6.5)
Neutrophil %: 70.4 %
PLATELETS: 344 10*3/uL (ref 150–440)
RBC: 3.92 10*6/uL (ref 3.80–5.20)
RDW: 18.6 % — ABNORMAL HIGH (ref 11.5–14.5)
WBC: 7.4 10*3/uL (ref 3.6–11.0)

## 2013-11-25 LAB — COMPREHENSIVE METABOLIC PANEL
ALK PHOS: 86 U/L
AST: 31 U/L (ref 15–37)
Albumin: 3.4 g/dL (ref 3.4–5.0)
Anion Gap: 6 — ABNORMAL LOW (ref 7–16)
BILIRUBIN TOTAL: 0.6 mg/dL (ref 0.2–1.0)
BUN: 11 mg/dL (ref 7–18)
CHLORIDE: 100 mmol/L (ref 98–107)
CO2: 30 mmol/L (ref 21–32)
Calcium, Total: 9 mg/dL (ref 8.5–10.1)
Creatinine: 1.01 mg/dL (ref 0.60–1.30)
Glucose: 140 mg/dL — ABNORMAL HIGH (ref 65–99)
Osmolality: 274 (ref 275–301)
Potassium: 2.9 mmol/L — ABNORMAL LOW (ref 3.5–5.1)
SGPT (ALT): 26 U/L (ref 12–78)
Sodium: 136 mmol/L (ref 136–145)
Total Protein: 8 g/dL (ref 6.4–8.2)

## 2013-11-25 LAB — LIPASE, BLOOD: LIPASE: 153 U/L (ref 73–393)

## 2013-12-05 ENCOUNTER — Ambulatory Visit: Payer: Self-pay | Admitting: Otolaryngology

## 2014-03-10 ENCOUNTER — Encounter: Payer: Self-pay | Admitting: Neurology

## 2014-03-15 ENCOUNTER — Encounter: Payer: Self-pay | Admitting: Neurology

## 2014-03-17 DIAGNOSIS — M810 Age-related osteoporosis without current pathological fracture: Secondary | ICD-10-CM | POA: Insufficient documentation

## 2014-03-17 DIAGNOSIS — K5090 Crohn's disease, unspecified, without complications: Secondary | ICD-10-CM | POA: Insufficient documentation

## 2014-03-17 DIAGNOSIS — F329 Major depressive disorder, single episode, unspecified: Secondary | ICD-10-CM | POA: Insufficient documentation

## 2014-03-17 DIAGNOSIS — E538 Deficiency of other specified B group vitamins: Secondary | ICD-10-CM | POA: Insufficient documentation

## 2014-03-17 DIAGNOSIS — F418 Other specified anxiety disorders: Secondary | ICD-10-CM | POA: Insufficient documentation

## 2014-03-28 ENCOUNTER — Ambulatory Visit: Payer: Self-pay | Admitting: Unknown Physician Specialty

## 2014-04-24 DIAGNOSIS — M1712 Unilateral primary osteoarthritis, left knee: Secondary | ICD-10-CM | POA: Insufficient documentation

## 2014-06-03 ENCOUNTER — Ambulatory Visit: Payer: Self-pay | Admitting: Physician Assistant

## 2014-06-09 ENCOUNTER — Ambulatory Visit: Payer: Self-pay | Admitting: Orthopedic Surgery

## 2014-06-09 LAB — POTASSIUM: POTASSIUM: 2.8 mmol/L — AB (ref 3.5–5.1)

## 2014-06-10 ENCOUNTER — Ambulatory Visit: Payer: Self-pay | Admitting: Orthopedic Surgery

## 2014-07-29 ENCOUNTER — Ambulatory Visit: Payer: Self-pay | Admitting: Unknown Physician Specialty

## 2014-08-15 HISTORY — PX: WRIST FRACTURE SURGERY: SHX121

## 2014-09-11 DIAGNOSIS — R0781 Pleurodynia: Secondary | ICD-10-CM | POA: Insufficient documentation

## 2014-09-15 ENCOUNTER — Ambulatory Visit: Payer: Self-pay | Admitting: Unknown Physician Specialty

## 2014-09-25 DIAGNOSIS — S5290XD Unspecified fracture of unspecified forearm, subsequent encounter for closed fracture with routine healing: Secondary | ICD-10-CM | POA: Insufficient documentation

## 2014-11-19 ENCOUNTER — Other Ambulatory Visit
Admit: 2014-11-19 | Disposition: A | Discharge status: Home / Self Care | Payer: Self-pay | Attending: Unknown Physician Specialty | Admitting: Unknown Physician Specialty

## 2014-11-19 LAB — CLOSTRIDIUM DIFFICILE(ARMC)

## 2014-11-27 DIAGNOSIS — Z8781 Personal history of (healed) traumatic fracture: Secondary | ICD-10-CM | POA: Insufficient documentation

## 2014-11-27 DIAGNOSIS — Z9889 Other specified postprocedural states: Secondary | ICD-10-CM | POA: Insufficient documentation

## 2014-12-05 NOTE — Discharge Summary (Signed)
PATIENT NAME:  Nicole Bass, Nicole Bass MR#:  [id number] DATE OF BIRTH:  05-Sep-1950  DATE OF ADMISSION:  09/27/2012 DATE OF DISCHARGE:  09/29/2012  PRIMARY CARE PROVIDER:  Fulton Reek, M.D.   CONSULTANTS: 1.  Gaylyn Cheers, M.D., gastroenterology.  2.  Katheren Puller, M.D., surgery.   DISCHARGE DIAGNOSES: 1.  Viral gastroenteritis.  2.  Crohn's disease.  3.  Rheumatoid arthritis.  4.  Mild anemia.  DISCHARGE MEDICATIONS: 1.  Vitamin B12 once monthly IM.  2.  Carafate 1 gram t.i.d. as needed.  3.  Alprazolam 0.5 mg t.i.d. as needed.  4.  Requip 0.5 mg b.i.d.  5.  Folic acid 1 mg daily.  6.  Methotrexate 2.5 mg, take 8 tabs once weekly.  7.  Nexium 40 mg delayed release 1 capsule daily.  8.  Caltrate 600 mg 1 tab daily.  9.  Prednisone 10 mg daily.  10.  Wellbutrin XL 150 mg daily.  11.  Lexapro 20 mg once daily.  12.  Norco 325/5 mg t.i.d. p.r.n. pain.   HPI:  This is a 64 year old female with a history of rheumatoid arthritis, Crohn's disease,  osteoarthritis who presented with 3 days of abdominal pain with diarrhea and emesis. She denied hematemesis, melena or bright red blood per rectum. She was afebrile and had mild elevation of her white blood cell count on admission to 11,000. Liver function tests were normal. There was concern for abdominal pain due to a Crohn's flare, so she was admitted and placed on IV antibiotics and steroids.   HOSPITAL COURSE:   She underwent a CT scan of the abdomen and pelvis which was concerning for possible early small bowel obstruction due to history of Crohn's. Gastroenterology was consulted and Dr. Vira Agar evaluated the patient. He felt her clinical condition was most consistent with gastroenteritis. Dr. Jamal Collin, surgery, was also admitted and felt she had no acute surgical needs. She was initially n.p.o. and diet was advanced. She tolerated advancement in her diet, her outpatient medications, plus the antibiotics and steroids were continued. She  had improvement in her clinical course, although had persistent diarrhea. Her white blood cell count normalized on hospital day #2.  Her hematocrit was noted to decline some from 39.4% on admission to 28.5 on her second hospital day. Again, she had had no evidence of acute blood loss. And on review of outpatient labs, it was noted that in December her hematocrit was also mildly low at 32.5, so this was felt not to be an acute change.  Her discharge medications were the same as her admission medications. Dr. Vira Agar indicated plans to have her follow up with him for possible EGD and colonoscopy within 1 to 2 weeks after discharge.   DISCHARGE INSTRUCTIONS: 1.  Follow up with Dr. Vira Agar, gastroenterology, in 1 to 2 weeks.  2.  Follow up with Dr. Doy Hutching on October 15, 2012, at 2:45 p.m.     ____________________________ A. Lavone Orn, MD ams:ce D: 09/29/2012 11:14:49 ET T: 09/29/2012 17:26:53 ET JOB#: [id number]  cc: A. Lavone Orn, MD, <Dictator>  Dr. Fulton Reek Dr. Denita Lung Marcedes Tech MD ELECTRONICALLY SIGNED 10/07/2012 19:39

## 2014-12-05 NOTE — Consult Note (Signed)
PATIENT NAME:  Nicole Bass, Nicole Bass MR#:  [id number] DATE OF BIRTH:  25-Dec-1950  DATE OF CONSULTATION:  09/27/2012  REFERRING PHYSICIAN:   CONSULTING PHYSICIAN:  Manya Silvas, MD  HISTORY OF PRESENT ILLNESS: The patient is a 64 year old white female well known to me from previous medical problems. She was admitted because of nausea, vomiting and diarrhea. I was asked to see her in consultation. She said her problem started Monday night, today is Thursday, with some swelling and bloating in her abdomen followed by diarrhea and watery stools. She has had the watery stools continuously, maybe every 2 hours. Was seen in the ER yesterday and admitted last night; at 2:00 a.m. she was admitted.  She had vomiting that started yesterday, on Wednesday. Either in the ER or since admission, she has received Carafate, which generally constipates her, and she has not had a bowel movement since she was admitted.   REVIEW OF SYSTEMS: There is no rash, no acutely inflamed joints, no GI bleeding. Her last bowel movement was 2 times in the lobby bathroom, of the ER, last night. At the onset of this illness, she did have low grade fever, up to 100, chills and headache, which have all improved since admission. She also denies pharyngitis. No GU symptoms. No heart symptoms. No respiratory symptoms. She was around grandkids who had been sick the week before; this was on Sunday, and she ate fish at a Friday night party.   PAST MEDICAL HISTORY AND OTHER MEDICAL PROBLEMS: Include rheumatoid arthritis, Crohn's disease, previous osteoarthritis, nephrolithiasis, osteoporosis, migraine headaches, depression, previous history of Shigella dysentery and previous history of Campylobacter gastroenteritis.   PAST SURGICAL HISTORY: Cholecystectomy, appendectomy, hysterectomy, tonsillectomy, hemorrhoidectomy, C section x 2, tubal ligation, laminectomy of cervical spine, rotator cuff repair, partial small bowel resection secondary to  complications of Crohn's disease and left elbow surgery.   HABITS: Does not smoke, does not drink.   ALLERGIES: SULFA AND CEPHALOSPORINS.   FAMILY HISTORY: Mother had CHF and diabetes. Father with emphysema.  ADMISSION MEDICATIONS:  1. Requip 0.5 mg b.i.d. for restless leg syndrome p.r.n.  2. Carafate 1 gram 3 times a day.  3. Vitamin B12 once a month. 4. Plaquenil 200 mg a day. 5. Methotrexate 2.5 mg 8 tablets once a week, on Thursdays.  6. Nexium 40 mg a day.  7. Norco 5/325 mg every 4 hours as needed for pain. 8. Lexapro 10 mg a day. 9. Folic acid 1 mg a day.  10. Caltrate 600 mg once a day. 11. Alprazolam 0.5 mg t.i.d. p.r.n.   PHYSICAL EXAMINATION: GENERAL: White female who looks to be somewhat ill, but in no acute distress.  VITAL SIGNS: Temp 98.6, pulse 88, respirations 18, blood pressure 128/70 and pulse ox 92% in room air.  HEENT: Sclerae anicteric. Conjunctivae negative. Tongue negative. Fingers are pink. Head is atraumatic.  CHEST: Clear.  HEART: Shows 1/6 systolic murmur.  ABDOMEN: Bowel sounds present. There are multiple scars present from old surgeries. The abdomen is diffusely, mildly distended and somewhat tender all over. No palpable hepatosplenomegaly or masses.  EXTREMITIES: No edema.  SKIN: Warm and dry.  PSYCH: Mood and affect are appropriate and alert.  LABS:  Glucose 154, BUN 14, creatinine 1.21, sodium 140, potassium 3.4, chloride 107, CO2 25, calcium 9.7. Lipase 149. Total protein 8.5, albumin 3.9, total bili 0.5, alk phos 109, SGOT 34, SGPT 33. White count 11.1, hemoglobin 13.1, hematocrit 39.4, platelet count 332.  ASSESSMENT AND RECOMMENDATIONS: Although this sounds like  it started off as a viral syndrome with headaches, low-grade fever, diarrhea and then vomiting she may have picked this up from a grandchild. It is possible that she has progressed to a partial small bowel obstruction given her previous surgeries and her abdominal exam. Plan to get  three-way abdomen today and,    if symptoms persist, get a CAT scan in the morning and consult Dr. Jamal Collin, if there is no significant resolution of symptoms. I will follow along with you.  ____________________________ Manya Silvas, MD rte:sb D: 09/27/2012 14:03:49 ET T: 09/27/2012 14:37:32 ET JOB#: [id number]  cc: Manya Silvas, MD, <Dictator> Leonie Douglas. Doy Hutching, MD Mckinley Jewel, MD  Manya Silvas MD ELECTRONICALLY SIGNED 10/04/2012 7:58

## 2014-12-05 NOTE — Consult Note (Signed)
PATIENT NAME:  Nicole Bass, Nicole Bass MR#:  [id number] DATE OF BIRTH:  [birth year]  DATE OF CONSULTATION:  09/28/2012  REFERRING PHYSICIAN:  Gaylyn Cheers, MD CONSULTING PHYSICIAN:  S.G. Jamal Collin, MD  REASON FOR CONSULTATION: Abdominal pain, nausea, vomiting, diarrhea and history of Crohn's.   HISTORY OF PRESENT ILLNESS: This is a 64 year old female who is well known to me. She has history of Crohn's disease and had partial small bowel obstruction many years ago. For the most part, her Crohn's has been relatively under control. Currently she is on methotrexate and is also on prednisone because of significant arthritic associated problems. She was doing well up until 3 to 4 days ago when she had eaten a frozen meal of [place] and 2 hours later started feeling significant bloating and burping and then developed a lot of diarrhea. Over the next couple of days, her symptoms persisted and then she started having significant nausea and vomiting. She presented to the Emergency Room, on 09/26/2012, evening, and then subsequently was admitted. Since admission the patient has improved some. Her abdominal discomfort is much less, her nausea and vomiting has cleared, but she is still having some frequent bowel movements, but the stools are getting a little bit more formed. She has not had any fever or chills.   PAST MEDICAL HISTORY: In addition to her history of Crohn's and significant arthritis, the patient has had, as noted, partial small bowel obstruction, hysterectomy, appendectomy, cholecystectomy, hemorrhoidectomy, tonsillectomy, C-section, tubal ligation, cervical laminectomy and rotator cuff repair.  SOCIAL HISTORY: The patient is a nonsmoker and has no history of alcohol or drug abuse.   PHYSICAL EXAMINATION: GENERAL: The patient is alert and oriented and does not appear to be in any kind of distress whatsoever.  HEENT: She has a slight flushing of her face, which she says is new today, but does not have any  other symptoms. Her conjunctiva is pink. Tongue is moist, pink and clear.  NECK: Supple. No nodes or masses palpable.  ABDOMEN: Exam reveals soft abdomen. There is no evidence of abdominal wall hernias. There is some mild nonfocal tenderness. No tympany. No hernias identified.  EXTREMITIES: Free of any edema or cyanosis. The patient has good peripheral pulses.   LAB DATA: Shows white count of 11,000 on admission, now down to 8300. Initial hemoglobin was 13, which is down to 10 today. Chemistries for the most part are normal, at this time. Her liver functions are normal, except for an albumin which is down to 2.9.   RADIOLOGIC DATA: The patient had a CT scan done today. This was reviewed, as also her previous CT scan from June of 2013. There is no evidence of small bowel obstruction or any abnormality associated with the bowel. There is some minimal stranding noted, at the base of the mesentery, which has been present for quite a while now and remains unchanged. The nature of this is unclear. There are no other new findings noted on CT.   IMPRESSION AND RECOMMENDATIONS: Given the fact that the patient's symptoms started fairly suddenly, with no intermittent problems associated with her Crohn's, I would tend to think that this may not be related to her Crohn's. Since admission the patient seems to have made some progress and is actually doing better. There is no evidence of any surgical issues, in reference to her abdominal cavity, at this point. I feel it would be prudent to monitor the patient for an additional 24 to 48 hours and, if the diarrhea persists, evaluation  can be directed towards evaluation of stool for cultures and may even consider a repeat colonoscopy. In the absence of any significant surgical issues at this point, I will sign off and reconsult on a p.r.n. basis. Thank you for allowing me to evaluate and help in the care of this patient.  ____________________________ S.Robinette Haines,  MD sgs:sb D: 09/28/2012 12:27:20 ET T: 09/28/2012 12:44:33 ET JOB#: [id number]  cc: Synthia Innocent. Jamal Collin, MD, <Dictator> [facility] Robinette Haines MD ELECTRONICALLY SIGNED 10/01/2012 6:29

## 2014-12-05 NOTE — H&P (Signed)
PATIENT NAME:  Nicole Bass, Nicole Bass MR#:  [id number] DATE OF BIRTH:  [birth year]  DATE OF ADMISSION:  09/27/2012  PRIMARY CARE PHYSICIAN:  Dr. Fulton Reek.   REFERRING PHYSICIAN:  Dr. Graciella Freer.   CHIEF COMPLAINT:  Abdominal pain, nausea, vomiting and diarrhea.   HISTORY OF PRESENT ILLNESS:  The patient is a 64 year old pleasant Caucasian female with a history of Crohn's disease, rheumatoid arthritis and osteoarthritis.  She was in her usual state of health until about Monday, three days ago, when she ate dinner and soon after that she had bloating feeling and crampy central abdominal pain.  The pain described as 7 on a scale of 10 in terms of severity, however it is on and off pain.  Then the next day, that is on Tuesday, she developed watery diarrhea.  It was not until the last 24 hours when she started to have vomiting.  There was no report of any hematemesis.  No coffee-ground emesis, no melena or hematochezia.  She had low-grade fever at home, was 100, however she is afebrile here in the Emergency Department.  The patient tried to call her gastroenterologist Dr. Vira Agar, however Dr. Candace Cruise was covering for him and he responded to the call and advised her to go to the Emergency Department.   REVIEW OF SYSTEMS:  CONSTITUTIONAL:  The patient reports fever and chills and feeling hot and cold.  Mild fatigue.  EYES:  No blurring of vision.  No double vision.  EARS, NOSE, THROAT:  No hearing impairment.  No sore throat.  No dysphagia.  CARDIOVASCULAR:  No chest pain.  No shortness of breath.  No syncope. RESPIRATORY:  No cough.  No shortness of breath.  No chest pain.  GASTROINTESTINAL:  Reporting central abdominal pain, crampy in nature, associated with nausea, vomiting and diarrhea.  No hematochezia.  No melena.  GENITOURINARY:  No dysuria.  No frequency of urination.  MUSCULOSKELETAL:  No joint pain or swelling.  No muscular pain or swelling, other than her chronic arthritis in her small joints of  both hands.  INTEGUMENTARY:  No skin rash.  No ulcers.  NEUROLOGY:  No focal weakness.  No seizure activity.  No headache.  PSYCHIATRY:  No anxiety, but she has history of depression.  She may take Xanax also for occasional anxiety.  ENDOCRINE:  No polyuria or polydipsia.  No heat or cold intolerance.  HEMATOLOGY:  No easy bruisability.  No lymph node enlargement.   PAST MEDICAL HISTORY:  Crohn's disease, osteoarthritis, nephrolithiasis, osteoporosis, migraine headaches, depression.  Of note, there is a history of Shigella dysentery and also in January of 2013 she was admitted to this hospital with nausea, vomiting and diarrhea and abdominal pain and it was secondary to Campylobacter gastroenteritis.   PAST SURGICAL HISTORY:  Cholecystectomy, appendectomy, hysterectomy, tonsillectomy, hemorrhoidectomy, C-section x 2, tubal ligation, laminectomy of cervical spine, rotator cuff repair, partial small bowel resection secondary to complications of Crohn's disease and left elbow surgery.   SOCIAL HABITS:  Nonsmoker.  No history of alcohol or drug abuse.   SOCIAL HISTORY:  The patient is now widowed.  Her husband just died in 12-12-22 of last year after having lung cancer.  The patient lives on disability.   FAMILY HISTORY:  Her mother died from complications of congestive heart failure and diabetes mellitus.  Her father died in his early 87O from complications of emphysema and he was a smoker.  There is no family history of inflammatory bowel disease.   ADMISSION MEDICATIONS:  Requip 0.5 mg twice a day.  She takes that for restless leg syndrome as needed.  Carafate 1 gram 3 times a day.  Vitamin B12 injection once a month, Plaquenil 200 mg once a day, methotrexate 2.5 mg tablet, taking 8 tablets once a week on Thursday, Nexium 40 mg a day, Norco 5/325 q. 4 hours as needed for pain, Lexapro 10 mg a day, folic acid 1 mg a day, Caltrate 600 mg once a day, alprazolam or Xanax 0.5 mg 3 times a day as needed.    ALLERGIES:  SULFA AND CEPHALOSPORIN WAS REPORTED AS WELL.  ENBREL.  THE PATIENT STATES THAT SHE DOES NOT HAVE ANY ALLERGY TO PHENERGAN AND OXYCONTIN THAT WAS REPORTED IN THE MEDICAL RECORDS.   PHYSICAL EXAMINATION: VITAL SIGNS:  Blood pressure 135/68, respiratory rate 20, pulse 92, temperature 97.8, oxygen saturation 94%.  GENERAL APPEARANCE:  This is a middle-aged female lying in bed in no acute distress.  HEAD AND NECK:  No pallor.  No icterus.  No cyanosis.  EARS, NOSE, THROAT:  Ear examination revealed normal hearing, no discharge, no lesions.  Nasal mucosa was normal, no ulcers, no discharge.  Oropharyngeal area was normal.  No oral thrush, although it is noted that the tongue is coated with whitish material.  No exudates.  No ulcers.  EYES:  Examination revealed normal eyelids and conjunctivae.  Pupils about 6 to 7 mm, round, equal and reactive to light.  NECK:  Supple.  Trachea at midline.  No thyromegaly.  No cervical lymphadenopathies.  No masses.  HEART:  Normal S1, S2.  No S3, S4.  No murmur.  No gallop.  No carotid bruits.  RESPIRATORY:  Normal breathing pattern without use of accessory muscles.  No rales.  No wheezing.  ABDOMEN:  Is soft.  It is not distended, but she is tender to the palpation, but nonlocalized, could not palpate deeply for any hepatosplenomegalies or masses due to her abdominal tenderness.  Again, there is no rigidity.  No rebound.  SKIN:  No ulcers.  No subcutaneous nodules.  MUSCULOSKELETAL:  No joint swelling except for the small joints of her hands where there is also some deformity and swelling.  No clubbing.  NEUROLOGIC:  Cranial nerves II through XII are intact.  No focal motor deficit.  PSYCHIATRIC:  The patient is alert, oriented x 3.  Mood and affect were normal.   LABORATORY FINDINGS:  Serum glucose 154, BUN 14, creatinine 1.2, sodium 140, potassium 3.4.  Lipase 149, calcium 9.7.  Liver function tests and transaminases were normal.  CBC showed white  count of 11,000, hemoglobin 13, hematocrit 39, platelet count 332.   ASSESSMENT: 1.  Abdominal pain associated with nausea, vomiting and diarrhea, likely from gastroenteritis, however differential diagnosis will include flare-up of Crohn's disease but less likely.  2.  Peptic ulcer disease.  3.  Crohn's disease.  4.  Rheumatoid arthritis.  5.  Osteoarthritis.  6.  Nephrolithiasis.  7.  Osteoporosis.  8.  History of migraine headaches.  9.  Depression and anxiety.   PLAN:  We will admit the patient to the medical floor, start IV fluids, potassium replacement.  Zofran for the nausea and vomiting.  Stool for culture and Clostridium difficile.  Empiric IV antibiotic using ciprofloxacin along with Flagyl pending results of the stool culture.  I will refrain from using steroids unless the presentation is secondary to the Crohn's flare-up.  Gastroenterology consultation with Dr. Vira Agar who is familiar with her case.  Continue  the rest of her home medications.  Continue Nexium.   TIME SPENT IN EVALUATING THIS PATIENT:  Took more than 55 minutes including reviewing her medical records.     ____________________________ Clovis Pu. Lenore Manner, MD amd:ea D: 09/27/2012 04:02:49 ET T: 09/27/2012 04:49:08 ET JOB#: [id number]  cc: Clovis Pu. Lenore Manner, MD, <Dictator> Mike Craze Irven Coe MD ELECTRONICALLY SIGNED 09/27/2012 7:25

## 2014-12-05 NOTE — Consult Note (Signed)
Pt seen and I think initially had a viral like syndrome or possible food poisoning.  She has diffuse tenderness and mild distention, worrry about early partial small bowel obstruction.  Will get abd films today  and CT tomorrow morning and he will see her in morning.  Electronic Signatures: Manya Silvas (MD)  (Signed on 13-Feb-14 13:35)  Authored  Last Updated: 13-Feb-14 13:35 by Manya Silvas (MD)

## 2014-12-05 NOTE — Consult Note (Signed)
CC; gastroenteritis with abd pain.  She is feeling much better today, had loose stools in morning but not since then.  Abd less distended and less tender, bowel sounds present.  Stool cult neg and C. diff neg.  CT scan showed no obstruction.  WBC 8.3, hgb 10. VSS afeb. Will see in office next week and schedule colon and EGD.  Will advance diet to full liq and mashed potatoes.  Probably home in morning.  Electronic Signatures: Manya Silvas (MD)  (Signed on 14-Feb-14 16:39)  Authored  Last Updated: 14-Feb-14 16:39 by Manya Silvas (MD)

## 2014-12-06 NOTE — Op Note (Signed)
PATIENT NAME:  Nicole Bass, REGNER MR#:  [id number] DATE OF BIRTH:  [birth year]  DATE OF PROCEDURE:  06/10/2014  PREOPERATIVE DIAGNOSIS: T8 compression fracture.   POSTOPERATIVE DIAGNOSIS: T8 compression fracture.  PROCEDURE: T8 vertebral body biopsy and kyphoplasty.   ANESTHESIA: MAC.   SURGEON: Laurene Footman, M.D.   DESCRIPTION OF PROCEDURE: The patient was brought to the operating room and after adequate sedation was given the patient was placed prone. C-arm was brought in, and both AP and lateral projections with good visualization of the T8 vertebra. The patient identification and timeout procedures were completed. The skin was prepped with alcohol and 5 mL of 1% Xylocaine was infiltrated on either side for subsequent injection and incision. The back was then prepped and draped in the usual sterile fashion. Repeat timeout procedure completed. A spinal needle was used on the right side getting over the rib to the pedicle of T8 with injection of a combination of Sensorcaine and Xylocaine with epinephrine, a total of 20 mL. After this was allowed to set, a small stab incision was made and an express tray was used with the trocar being advanced into the vertebral body after checking on both AP and lateral projections to make sure the spinal canal was not violated. After the placement of the cannula, a biopsy was obtained. Drilling was carried out and then a balloon inflated to 2 mL. This was then removed and cement of the appropriate consistency was then infiltrated into the bone with good fill of the T8 vertebral body, approximately 3 mL being used to fill the vertebral body. At this point, permanent C-arm views were obtained after trocar removal. There was good fill in both AP and lateral projections without extravasation. Dermabond was used to close the incision followed by a Band-Aid. The patient was sent to the recovery room in stable condition.   ESTIMATED BLOOD LOSS: Minimal.   COMPLICATIONS:  None.   SPECIMEN: T8 vertebral body biopsy. Implant and bone cement.    CONDITION: To  recovery room, stable.   ____________________________ Laurene Footman, MD mjm:am D: 06/10/2014 18:20:12 ET T: 06/11/2014 04:35:46 ET JOB#: [id number]  cc: Laurene Footman, MD, <Dictator> Laurene Footman MD ELECTRONICALLY SIGNED 06/11/2014 8:28

## 2014-12-07 NOTE — Consult Note (Signed)
Brief Consult Note: Diagnosis: Presented to the ER with two day history of diarrhea, vomiting and abdominal pain in the setting of known history of Crohn's disease involving small bowel.  History of small bowel resection when diagnosed at the age of 21.  Possible differential diagnosis is flare of Crohn's disease as she has been off of Humira for the past year.  Unable to afford medication due to insurance coverage.  Differential is additionally viral gastroenteritis.  Positive ill contact exposure.  Negative for C. Diff.   Consult note dictated.   Recommend to proceed with surgery or procedure.   Recommend further assessment or treatment.   Orders entered.   Discussed with Attending MD.   Comments: Patient's presentation discussed with Dr. Verdie Shire.  Recommendations at this time is to monitor patient's response with IV hydration and correction of electrolyte imbalances.  No gross evidence of active Crohn's flare on CT scan.  DIfferential was inclusive of possible gastroeneritits.  Will proceed with TB screening as we do recommend patient to be continued on Humira if able to work out finances for patient or the consideration of Cimiza.  Will proceed with assistance help through our office.  Continued monitoring electrolytes.  Continue antiemetic therapy.  Pending stool study results.  Addendum:   Initially at the time of brief consult note stool studies were not available.  Noted positive comprehensive stool study.  Recommend continued antibiotic therapy of Ciprofloxacin and Flagyl.  Receiving both IV.  Will monitor her response to antibiotic therapy.  Electronic Signatures: Payton Emerald (NP)  (Signed 10-Jan-13 14:51)  Authored: Brief Consult Note   Last Updated: 10-Jan-13 14:51 by Payton Emerald (NP)

## 2014-12-07 NOTE — Consult Note (Signed)
PATIENT NAME:  Nicole Bass, LLAMAS MR#:  [id number] DATE OF BIRTH:  04-30-1951  DATE OF CONSULTATION:  08/25/2011  REFERRING PHYSICIAN:  Alounthith Phichith, MD  CONSULTING PHYSICIAN: Verdie Shire, MD/Dawn Ruthe Mannan, NP PRIMARY GASTROENTEROLOGIST: Gaylyn Cheers, MD   PRIMARY CARE PHYSICIAN: Fulton Reek, MD   RHEUMATOLOGIST: Cheral Almas., MD    REASON FOR CONSULTATION: Enteritis.    HISTORY OF PRESENT ILLNESS: Ms. Nicole Bass is a 64 year old Caucasian female with a significant past medical history of Crohn's disease involving small bowel, inflammatory arthritis, osteoarthritis, degenerative disk disease, nephrolithiasis, depression and chronic anemia. She was diagnosed with Crohn's disease at the age of 67 and is status post small bowel resection. Currently, Crohn's disease is being managed with methotrexate 2.5 mg, 8 tablets weekly. She was on Humira 40 mg/0.8 mL injection biweekly up until January of last year. Due to insurance cost and coverage, the patient has been able to afford her medication. Assistance is being looked into by our Office.   She had acute onset Tuesday morning of being very tired. She lay down, took a nap, awoke experiencing diarrhea and vomiting which continued all throughout the night into Wednesday. The patient started to become concerned that something was wrong, especially given her history of inflammatory bowel disease. She spoke to a GI physician who was on call who advised her to present to the Emergency Room. Upon presenting, laboratory studies revealed evidence of dehydration and mild hypokalemia. The patient states at home she was vomiting once an hour as well as experiencing one bowel movement an hour, though no nocturnal bowel movements, no evidence of melena or rectal bleeding. She says that she has not been feeling well intermittently since Thanksgiving of 2012. She was diagnosed with influenza, sick for 2 to 3 weeks, was placed on a Z-Pak. Following that, she  battled right frozen shoulder and received pain injections. Just last week, she presented to [facility] and was diagnosed with acute bronchitis and was placed on doxycycline 100 mg twice a day.   In association with the current GI symptoms, she has been experiencing epigastric pain with soreness bilaterally to lower quadrants of her abdomen. She did have a fever of 103, and upon presenting to the Emergency Room approximately 101. The last episode of vomiting was 11:00 last night. Last episode of diarrhea with 45 minutes ago. Consistency is watery. Normally her bowels move 4 to 5 times a day, mostly in the morning. Weight has been stable. The patient has experienced ill contact exposure. Her granddaughter did have a GI virus which has had been going around, and she was exposed to her 48 hours prior to onset of her current GI symptoms.   CT scan of the abdomen and pelvis was done through ER evaluation, which revealed that there was no evidence of bowel obstruction, a mild ileus or gastroenteritis-type gas pattern noted with fluid in small and large bowel loops, possible correlation with her Crohn's disease. There was increased density in the mesenteric fat inferior to the pancreatic head and proximal body which was not a new finding, likely reflective of chronic inflammation. No evidence of a pseudocyst or ductal dilatation. In the pancreas, there is fatty infiltration changes of the liver, and the gallbladder is surgically absent. White count was 9.1, hemoglobin was 11.4, hematocrit 34, lipase 92 with an albumin of 3.3.   PAST MEDICAL HISTORY:  1. Crohn's disease, history positive in the past with celiac disease, though followup revealed no evidence  of a disease process. 2. Inflammatory arthritis.  3. Osteoarthritis.  4. Degenerative disk disease.  5. Chronic anemia.  6. Nephrolithiasis. 7. Migraine headaches.  8. Osteoporosis.  9. Depression.  10. History of Shigella dysentery.     PAST SURGICAL HISTORY:  1. C-section x2.  2. Tubal ligation.  3. Tonsillectomy.  4. Appendectomy.  5. Cholecystectomy. 6. Laminectomy of cervical spine.  7. Hysterectomy with bilateral salpingo-oophorectomy.  8. Rotator cuff repair. 9. Partial small bowel resection related to Crohn's disease. 10. Hemorrhoidectomy.  11. Left elbow surgery.   12. Most recent colonoscopy 01/02/2009 was performed by Dr. Gaylyn Cheers. Evidence of internal hemorrhoids. Pathology report revealed no abnormality being noted.  13. Upper endoscopy was also done on this date, 01/03/2011, which revealed localized mild inflammation to the gastric body. Multiple small sessile polyps with no recent bleeding were found in the gastric body. Pathology report revealed mild chronic gastritis and fundic gland polyps.   ALLERGIES: Phenergan, cephalosporin, Merrem, OxyContin, questionable Enbrel, and sulfa.   MEDICATIONS:  1. Vitamin B12 injection monthly.  2. Desipramine 25 mg at bedtime. 3. Folic acid 1 mg daily.  4. Ibuprofen 600 mg t.i.d. as needed. 5. Lexapro 10 mg a day. 6. Methotrexate 2.5 mg, 8 tablets weekly on Friday.  7. Omeprazole 40 mg a day. 8. Vicodin 5/500 mg, 1 tablet b.i.d. as needed.  9. Alprazolam 0.5 mg as needed. 10. Carafate 1 gram as needed.  11. Tylenol Arthritis Extra Strength 650 mg, 2 tablets as directed as needed.  12. Black cohosh supplement. 13. Prednisone 5 mg as needed.  14. Hydroxychloroquine 200 mg b.i.d.     SOCIAL HISTORY: She resides in [place] with her husband. No tobacco. No alcohol use. Currently on Disability.   FAMILY HISTORY: Significant for diabetes, congestive heart failure. Grandfather history of colon cancer. Mother history of colonic polyps. No family history of IBD or celiac disease.   REVIEW OF SYSTEMS: CONSTITUTIONAL: Significant for fever, fatigue. No weight loss. EYES: No changes in vision. ENT: No epistaxis or discharge. No dysphagia. RESPIRATORY: Recently  diagnosed with acute bronchitis. No hemoptysis. Denies shortness of breath. CARDIOVASCULAR: No chest pain. No orthopnea, palpitations or syncope, though she did endorse presyncopal feeling. GI: See history of present illness. GU: No dysuria or hematuria. ENDOCRINE: No polyuria or polydipsia. HEMATOLOGICAL: Denies significant for easy bruising and bleeding. SKIN: No mouth ulcerations, lesions. MUSCULOSKELETAL: Significant for achy feeling all over. Known history of arthritis, followed by Dr. Cristi Loron. NEUROLOGIC: No history of CVA, transient ischemic attack, or seizure disorder. PSYCHIATRIC: Known history of depression, no anxiety.    PHYSICAL EXAMINATION:  VITAL SIGNS: Temperature 98.1, pulse is 87, respirations are 20, blood pressure is 102/66, and pulse oximetry is 96% on room air.   GENERAL: Well-developed, well nourished 64 year old Caucasian female who does appear mildly ill but resting comfortably in bed.   HEENT: Normocephalic, atraumatic. Pupils are equal and reactive to light. Conjunctivae are clear.   NECK: Supple. Trachea midline. No lymphadenopathy or thyromegaly.   PULMONARY: Symmetric rise and fall of chest. Clear to auscultation throughout.   CARDIOVASCULAR: Regular rate and rhythm, S1, S2. No murmurs. No gallops.   ABDOMEN: Nondistended. Bowel sounds in four quadrants. Mild-to-moderate discomfort throughout entire abdomen, more localized epigastric as well as bilateral lower quadrants. No bruits. No masses.   RECTAL: Deferred.   MUSCULOSKELETAL: Movement of all four extremities. No contractures. No clubbing.   EXTREMITIES: No edema, color slightly pale, warm, dry. No rashes. No lesions.   PSYCHIATRIC:  Flat affect. Depressive mood. Alert and oriented x4.   NEUROLOGICAL: No gross neurological deficits.   LABORATORY, DIAGNOSTIC AND RADIOLOGICAL DATA:  Chemistry panel: Glucose is 121, potassium was 3.4, otherwise within normal limits.  Hepatic panel: Albumin was low  at 3.3.  Hemoglobin RBC was 3.77, hemoglobin was 11.4 with hematocrit of 34.0. RDW is 14.6. WBC count, again, was 9.1.  Sedimentation rate is elevated at 64.  Stool comprehensive is positive for Campylobacter antigen being detected.  Stool for WBCs, no RBCs or WBCs.  C. difficile is negative.  Urinalysis is essentially unremarkable.  CT scan of the abdomen and pelvis with contrast: Findings as noted under history.   IMPRESSION: The patient presented to the ER with a two-day history of diarrhea, vomiting, abdominal pain, in the setting of known history of Crohn's disease involving small bowel, history of small bowel resection with diagnosis at the age of 89. Possible differential diagnosis includes a flare of Crohn's disease as she has been off of Humira for the past year, unable to afford medication due to insurance coverage. Differential would be viral gastroenteritis. Positive ill contact exposure. Negative for C. difficile.   PLAN: Recommendations are to monitor the patient's presentation at this time as she has improved somewhat with IV hydration, correction of electrolyte imbalance. No gross evidence of a flare of Crohn's disease noted on CT scan. We will proceed, though, with tuberculosis screening as we do recommend for her to get back on TNF therapy when possible. We will attempt to help facilitate this through our office. Continue monitoring of electrolytes. Continue antiemetic therapy and then pending stool study results.   These services provided by Payton Emerald, MS, APRN, [facility], FNP,  under collaborative agreement with Verdie Shire, MD.    Thank you for allowing [place] to participate in her care on 08/25/2011.   ADDENDUM: As dictated above, stool study results were not available initially at the time of brief consult note. Noted now is evidence of Campylobacter. Do recommend the continuation of antibiotic therapy as she is receiving ciprofloxacin IV as well as metronidazole IV. Again, we will  continue to monitor her response to antibiotic therapy.   ____________________________ Payton Emerald, NP dsh:cbb D: 08/25/2011 14:49:32 ET T: 08/25/2011 16:42:31 ET JOB#: [id number]  cc: Payton Emerald, NP, <Dictator> Payton Emerald MD ELECTRONICALLY SIGNED 08/26/2011 16:20

## 2014-12-07 NOTE — H&P (Signed)
PATIENT NAME:  Nicole Bass, Nicole Bass MR#:  [id number] DATE OF BIRTH:  [birth year]  DATE OF ADMISSION:  08/25/2011  REFERRING PHYSICIAN: Dr. Thomasene Lot.  PRIMARY CARE PHYSICIAN: Dr. Doy Hutching.  RHEUMATOLOGIST: Dr. Jefm Bryant. GASTROENTEROLOGIST: Dr. Vira Agar.   PRESENTING COMPLAINT: Abdominal pain, nausea, vomiting, diarrhea, fever, headache.   HISTORY OF PRESENT ILLNESS: Nicole Bass is a pleasant 64 year old woman with history of Crohn's disease, inflammatory arthritis, osteoarthritis, degenerative disk disease, nephrolithiasis, depression, chronic anemia who presents today with reports of developing nausea and vomiting with symptoms of bronchitis approximately two weeks ago, but her nausea and vomiting was off and on. For the past two days she developed persistent nausea and vomiting, poor p.o. intake and diarrhea as well as fever as high as 103 and headache. She reports that her grandchild had a 24 hour bug, but by the time she saw the child, symptoms had subsided. She has had weakness for the past two days with presyncope, but no syncope. No chest pain, shortness of breath or orthopnea. No palpitations.   PAST MEDICAL HISTORY:  1. Crohn's disease.  2. History of positive celiac disease panel.  3. Inflammatory arthritis.  4. Osteoarthritis/degenerative disk disease.  5. Chronic anemia.  6. Nephrolithiasis.  7. Migraines.  8. Osteoporosis.  9. Depression.  10. History of Shigella dysentery.   PAST SURGICAL HISTORY:  1. Cesarean section x2.  2. Tubal ligation.  3. Tonsillectomy.  4. Appendectomy.  5. Cholecystectomy.  6. Laminectomy of the cervical spine.  7. Hysterectomy.  8. Rotator cuff repair.  9. Partial small bowel resection secondary to Crohn's disease.  10. Hemorrhoidectomy.  11. Left elbow surgery.   ALLERGIES: Phenergan, cephalosporin, Merrem, OxyContin, Enbrel, sulfa.   MEDICATIONS:  1. Vitamin B12 IM monthly.  2. Desipramine 25 mg at bedtime.  3. Folic acid 1 mg daily.   4. Ibuprofen 600 mg t.i.d. as needed.  5. Lexapro 10 mg daily.  6. Methotrexate 2.5 mg 8 tablets weekly on Friday. 7. Omeprazole 40 mg daily.  8. Vicodin 5/500, one tablet b.i.d. as needed.  9. Alprazolam 0.5 mg as needed.  10. Carafate 1 gram as needed.  11. Tylenol Arthritis Extra Strength 650 mg 2 tablets as needed.  12. Vitamin Z20 folic acid supplement.  13. Black cohosh supplement.  14. Prednisone 5 mg as needed.  15. Hydroxychloroquine 200 mg b.i.d.   SOCIAL HISTORY: She lives in [place] with her husband. She denies any tobacco, alcohol or drug use. She is on disability.   FAMILY HISTORY: Diabetes and congestive heart failure.   REVIEW OF SYSTEMS: CONSTITUTIONAL: She endorses fevers, nausea and vomiting. EYES: No changes in vision. ENT: No epistaxis or discharge. No dysphagia. RESPIRATORY: Recently dealt with bronchitis. No hemoptysis. She denies any shortness of breath. CARDIOVASCULAR: No chest pain, orthopnea, palpitations, or syncope. She endorses presyncope. GASTROINTESTINAL: As per history of present illness. GU: No dysuria or hematuria. ENDOCRINE: No polyuria or polydipsia. HEMATOLOGIC: No easy bleeding. SKIN: No ulcers. MUSCULOSKELETAL: She just feels achy. NEUROLOGIC: No one-sided weakness or numbness. PSYCH: Denies any suicidal ideation.   PHYSICAL EXAMINATION:  VITAL SIGNS: Temperature 101.9, pulse 142 initially, current pulse low 100s, respiratory rate 22, blood pressure 120/70, sating at 96% on room air.   GENERAL: Lying in bed in no apparent distress.   HEENT: Normocephalic, atraumatic. Pupils equal, symmetric. She has slightly dry mucous membrane.   NECK: Soft and supple. No adenopathy or JVP.   HEART: Slightly tachy. No murmurs, rubs, or gallops.   LUNGS: Clear to auscultation bilaterally.  No use of accessory muscles or increased respiratory effort.   ABDOMEN: Soft. Positive bowel sounds with diffuse tenderness on palpation. No rebound or guarding.    EXTREMITIES: No edema. Dorsal pedis pulses intact.   MUSCULOSKELETAL: No joint effusion.   SKIN: No ulcers.   NEUROLOGIC: Symmetrical strength. No focal deficits.   PSYCH: She is alert and oriented. The patient is cooperative.   PERTINENT LABS AND STUDIES: Urinalysis with specific gravity of 1.018, pH 6, RBC one per high-power field, WBC one per high-power field, WBC 9.1, hemoglobin 11.4, hematocrit 34, platelets 264, MCV 90, glucose 121, BUN 9, creatinine 0.86, sodium 140, potassium 3.4, chloride 103, carbon dioxide 25, calcium 9.1. LFTs within normal limits. Albumin 3.3, lipase 92. CT of the abdomen and pelvis shows diffuse herpetic low attenuation consistent with fatty infiltration. Lung bases show mild posterior bibasilar dependent atelectasis. There is mesenteric edema, fat stranding and a few small mesenteric lymph nodes along the root of the mesentery. There is mild nonspecific ileus versus enterocolitis and nonspecific mesenteric edema versus mesenteritis. There is a small nonobstructing right renal calculus.   ASSESSMENT AND PLAN: Nicole Bass is a 64 year old woman with a history of Crohn's disease status post partial small bowel resection, inflammatory arthritis, osteoarthritis, degenerative disk disease, chronic anemia presenting with complaints of abdominal pain, nausea, vomiting, poor p.o. intake, diarrhea, headache and fever.  1. Possible enterocolitis plus/minus mesenteritis. Will send stool studies. Questionable Crohn's flare. Will send CRP and ESR. Start Cipro, Flagyl, IV fluids trial and clear diet. The patient has not taken much for the past two weeks. Continue to hold for now while she is symptomatic. Continue to control symptoms with IV Protonix. Obtain gastroenterology consultation.  2. Sinus tachycardia in the setting of infection. Follow on off-unit telemetry for 24 hours.  3. Hypokalemia. Send magnesium level, replace potassium as needed.  4. Chronic anemia, stable.   5. Prophylaxis with Protonix, Lovenox.   TIME SPENT: Approximately 45 minutes were spent on patient care.   ____________________________ Rita Ohara, MD ap:ap D: 08/25/2011 01:59:48 ET T: 08/25/2011 07:14:57 ET JOB#: [id number]  cc: Brien Few Treyvonne Tata, MD, <Dictator> Leonie Douglas. Doy Hutching, MD Rita Ohara MD ELECTRONICALLY SIGNED 09/03/2011 2:30

## 2014-12-07 NOTE — Discharge Summary (Signed)
PATIENT NAME:  Nicole Bass, Nicole Bass MR#:  [id number] DATE OF BIRTH:  1951/05/04  DATE OF ADMISSION:  08/25/2011 DATE OF DISCHARGE:  08/30/2011  REASON FOR ADMISSION: Abdominal pain with nausea, vomiting, and diarrhea.   HISTORY OF PRESENT ILLNESS: Please see the dictated history of present illness done by Dr. Inez Catalina on 08/25/2011.   PAST MEDICAL HISTORY:  1. Crohn's disease.  2. Inflammatory arthritis.  3. Osteoarthritis.  4. Degenerative disk disease.  5. Depression.  6. Anemia of chronic disease.  7. Nephrolithiasis.  8. Migraine headaches.  9. Osteoporosis.   MEDICATIONS ON ADMISSION: Please see admission note.   ALLERGIES: Phenergan, cephalosporins, meropenem, OxyContin, Enbrel, and sulfa.   SOCIAL HISTORY, FAMILY HISTORY AND REVIEW OF SYSTEMS: As per admission note.   PHYSICAL EXAM: GENERAL: The patient was in no acute distress. VITAL SIGNS: Vital signs remarkable for a temperature of 101.9 with an initial heart rate of 142 and a blood pressure of 120/70. HEENT: Exam was unremarkable except for dry mucous membranes. NECK: Supple without jugular venous distention. LUNGS: Clear. CARDIAC: Exam revealed rapid rate with a regular rhythm. Normal S1, S2. ABDOMEN: Soft with diffuse tenderness. No rebound or guarding. EXTREMITIES: Without edema. NEUROLOGIC: Exam was grossly nonfocal.   HOSPITAL COURSE: The patient was admitted with nausea, vomiting, diarrhea with a history of Crohn's disease. Stool cultures were sent. She was started on empiric IV Protonix with IV fluids as well as IV antibiotics. Stool cultures returned positive for Campylobacter. She was seen in consultation by GI who recommended conservative therapy. Her diet was slowly advanced. Her diarrhea improved. Her abdominal pain improved. She was switched to oral antibiotics and continued to do well. By 08/30/2011, the patient was stable and ready for discharge.   DISCHARGE DIAGNOSES:  1. Campylobacter gastroenteritis.   2. Crohn's disease.  3. Anemia of chronic disease.  4. Anxiety/depression.  5. Osteoporosis.   DISCHARGE MEDICATIONS:  1. Lexapro 10 mg p.o. daily.  2. Xanax 0.5 mg p.o. t.i.d. p.r.n. anxiety.  3. Carafate 1 gram p.o. t.i.d. 4. Desipramine 25 mg p.o. b.i.d.  5. Omeprazole 40 mg p.o. daily.  6. Norco 5/325, 1 to 2 p.o. every four hours p.r.n. pain.  7. Requip 0.5 mg p.o. b.i.d.  8. Tessalon 100 mg p.o. q.6h. p.r.n. cough.  9. Ambien 10 mg p.o. at bedtime p.r.n. sleep.  10. Methotrexate 20 mg p.o. q. week.  11. Zofran 4 mg p.o. every four hours p.r.n. nausea.  12. Levaquin 500 mg p.o. daily x1 week.   FOLLOW-UP PLANS AND APPOINTMENTS: The patient was discharged on a bland diet. She will follow up with Dr. Vira Agar within one week's time, sooner if needed.   ____________________________ Leonie Douglas Doy Hutching, MD jds:cms D: 09/03/2011 02:18:59 ET T: 09/03/2011 13:30:17 ET JOB#: [id number]  cc: Leonie Douglas. Doy Hutching, MD, <Dictator> Angelynn Lemus Lennice Sites MD ELECTRONICALLY SIGNED 09/04/2011 0:01

## 2014-12-07 NOTE — Consult Note (Signed)
Pt seen and examined. See Dawn Harrison's notes. Hx of Crohn's. No longer on Humira. Stool positive for campylobacter. Already on Abx. Should clear fairly quickly. Once infection cleared, patient should go back on anti-TNF therapy if possible. Will follow. Thanks.  Electronic Signatures: Verdie Shire (MD)  (Signed on 10-Jan-13 16:09)  Authored  Last Updated: 10-Jan-13 16:09 by Verdie Shire (MD)

## 2014-12-07 NOTE — Consult Note (Signed)
Chief Complaint:   Subjective/Chief Complaint Less BM's today. Still with abd cramping and nausea.   VITAL SIGNS/ANCILLARY NOTES: **Vital Signs.:   11-Jan-13 10:40   Vital Signs Type Q 4hr   Temperature Temperature (F) 98   Celsius 36.6   Temperature Source oral   Pulse Pulse 100   Pulse source per Dinamap   Respirations Respirations 20   Systolic BP Systolic BP 409   Diastolic BP (mmHg) Diastolic BP (mmHg) 71   Mean BP 85   BP Source Dinamap   Pulse Ox % Pulse Ox % 96   Pulse Ox Activity Level  At rest   Oxygen Delivery Room Air/ 21 %   Brief Assessment:   Cardiac Regular    Respiratory normal resp effort    Gastrointestinal diffusely tender   Radiology Results: CT:    10-Jan-13 00:00, CT Abdomen and Pelvis With Contrast   CT Abdomen and Pelvis With Contrast    REASON FOR EXAM:    (1) Abd pain, hx of Crohn's with abscess/complicated   surgical hx, eval for absce  COMMENTS:       PROCEDURE: CT  - CT ABDOMEN / PELVIS  W  - Aug 25 2011 12:00AM     RESULT: Axial CT scanning was performed through the abdomen andpelvis   with reconstructions at 3 mm intervals and slice thicknesses following   intravenous administration of 100 cc of Isovue-370. The patient also   received oral contrast material. Comparison is made to a study dated 09 November 2010. Review of multiplanar reconstructed images was performed   separately on the VIA monitor.    The liver exhibits decreased density diffusely consistent with fatty   infiltration. The gallbladder is surgically absent. The pancreas exhibits     no focal mass or ductal dilation or pseudocyst formation. The   peripancreatic fat is normal in appearance. However, inferior to the   pancreatic head and proximal body there is persistent increased density   within the mesenteric fat. This was demonstrated on the previous study   and may reflect low-grade chronic inflammation. The stomach is moderately   distended with contrast. Is  duodenum and jejunum and ileum exhibit no   evidence of obstruction. Contrast has reached the right colon. There are   surgical clips inthe right lower quadrant of the abdomen. The colon is   partially distended with contrast, fluid, and gas with no evidence of   obstruction. The rectum is moderately distended with fluid.    There is no free fluid in the abdomen or pelvis. The adrenal glands,   spleen, and kidneys exhibit no acute abnormality. There is a stable   appearing nonobstructing mid pole calyceal stone on the right which   measures approximately 4 mm in diameter. The urinary bladder is partially     distended and grosslynormal. The uterus is surgically absent. I see no   adnexal masses. The caliber of the abdominal aorta is normal. I see no   umbilical nor inguinal hernia.    The lung bases exhibit minimal compressive atelectasis posteriorly. The   lumbar vertebral bodies are preserved in height.    IMPRESSION:   1. There is no evidence of bowel obstruction. A mild ileus or   gastroenteritis type gas pattern is noted with fluid and small and large   bowel loops. This may be related to the known Crohn's disease.  2. There is increased density in the mesenteric fat inferior to the  pancreatic head and proximal body which is not a new finding and likely   reflects chronic inflammation. I do not see evidence of a pseudocyst or   ductal dilation of the pancreas.  3. There are fatty infiltrative changes of the liver. The gallbladder is   surgically absent.  4. There is a nonobstructing mid pole calyceal stone on the right.    A preliminary report was sent to the emergency department at the   conclusion of thestudy.          Verified By: DAVID A. Martinique, M.D., MD   Assessment/Plan:  Assessment/Plan:   Assessment Positive campylobacter.    Plan Continue Abx. Advance diet as tolerated. Make sure patient f/u with Dr. Vira Agar after discharge. Either I or Dr. Vira Agar will  check back on Monday if patient still here. Thanks   Electronic Signatures: Verdie Shire (MD)  (Signed 11-Jan-13 12:12)  Authored: Chief Complaint, VITAL SIGNS/ANCILLARY NOTES, Brief Assessment, Radiology Results, Assessment/Plan   Last Updated: 11-Jan-13 12:12 by Verdie Shire (MD)

## 2014-12-07 NOTE — Consult Note (Signed)
Chief Complaint:   Subjective/Chief Complaint Though the frequency of diarrhea has lessened, still with loose stools. Some abd pain after eating yest; none so far today. No nausea/vomiting. Tolerating solids. States gets MTX usually on Tues. Does not recall ever being on 5 ASA products. Still weak.   VITAL SIGNS/ANCILLARY NOTES: **Vital Signs.:   14-Jan-13 09:44   Vital Signs Type Q 4hr   Temperature Temperature (F) 98.2   Celsius 36.7   Temperature Source oral   Pulse Pulse 109   Pulse source per Dinamap   Respirations Respirations 18   Systolic BP Systolic BP 015   Diastolic BP (mmHg) Diastolic BP (mmHg) 68   Mean BP 85   BP Source Dinamap   Pulse Ox % Pulse Ox % 96   Pulse Ox Activity Level  At rest   Oxygen Delivery Room Air/ 21 %   Brief Assessment:   Cardiac Regular    Respiratory clear BS    Gastrointestinal mild lower abd tenderness   Routine Hem:  14-Jan-13 04:44    WBC (CBC) 3.5   RBC (CBC) 3.07   Hemoglobin (CBC) 9.2   Hematocrit (CBC) 28.0   Platelet Count (CBC) 201   MCV 91   MCH 29.9   MCHC 32.8   RDW 13.6  Routine Chem:  14-Jan-13 04:44    Glucose, Serum 91   BUN 1   Creatinine (comp) 0.66   Sodium, Serum 147   Potassium, Serum 3.5   Chloride, Serum 109   CO2, Serum 26   Calcium (Total), Serum 8.2   Osmolality (calc) 288   eGFR (African American) >60   eGFR (Non-African American) >60   Anion Gap 12  Routine Hem:  14-Jan-13 04:44    Neutrophil % 43.9   Lymphocyte % 37.3   Monocyte % 11.7   Eosinophil % 6.5   Basophil % 0.6   Neutrophil # 1.5   Lymphocyte # 1.3   Monocyte # 0.4   Eosinophil # 0.2   Basophil # 0.0   Assessment/Plan:  Assessment/Plan:   Assessment Campylobacter/bronchitis. On levaquin and azithromax. Hx of PUD in the past. Now on protonix bid.    Plan Would like to give MTX while she is here but major interaction with protonix. Will stop protonix and place patient on carafate. MTX for tomorrow. Consider 5 ASA  product. Will discuss with DR. Elliott.  I will be out tomorrow at [facility]. Dr. Vira Agar can see patient tomorrow.   Electronic Signatures: Verdie Shire (MD)  (Signed 14-Jan-13 13:13)  Authored: Chief Complaint, VITAL SIGNS/ANCILLARY NOTES, Brief Assessment, Lab Results, Assessment/Plan   Last Updated: 14-Jan-13 13:13 by Verdie Shire (MD)

## 2014-12-07 NOTE — Consult Note (Signed)
Pt with 5-6 bowel movements a day, still watery.  She is not ready for discharge due to possiblity of dehydration  from the diarrhea. I will see her from here, thanks to Dr. Candace Cruise for seeing her.  Electronic Signatures: Manya Silvas (MD)  (Signed on 14-Jan-13 20:03)  Authored  Last Updated: 14-Jan-13 20:03 by Manya Silvas (MD)

## 2014-12-08 LAB — SURGICAL PATHOLOGY

## 2015-01-15 DIAGNOSIS — R600 Localized edema: Secondary | ICD-10-CM | POA: Insufficient documentation

## 2015-01-15 DIAGNOSIS — R0602 Shortness of breath: Secondary | ICD-10-CM | POA: Insufficient documentation

## 2015-01-15 DIAGNOSIS — R0600 Dyspnea, unspecified: Secondary | ICD-10-CM | POA: Insufficient documentation

## 2015-02-03 ENCOUNTER — Ambulatory Visit: Payer: Medicare Other | Attending: Specialist

## 2015-02-03 DIAGNOSIS — G4733 Obstructive sleep apnea (adult) (pediatric): Secondary | ICD-10-CM | POA: Insufficient documentation

## 2015-02-03 DIAGNOSIS — G4761 Periodic limb movement disorder: Secondary | ICD-10-CM | POA: Diagnosis not present

## 2015-03-23 DIAGNOSIS — M545 Low back pain, unspecified: Secondary | ICD-10-CM | POA: Insufficient documentation

## 2015-04-08 ENCOUNTER — Other Ambulatory Visit: Payer: Self-pay | Admitting: Unknown Physician Specialty

## 2015-04-08 DIAGNOSIS — M545 Low back pain: Secondary | ICD-10-CM

## 2015-04-09 ENCOUNTER — Ambulatory Visit
Admission: RE | Admit: 2015-04-09 | Discharge: 2015-04-09 | Disposition: A | Discharge status: Home / Self Care | Payer: Medicare Other | Source: Ambulatory Visit | Attending: Unknown Physician Specialty | Admitting: Unknown Physician Specialty

## 2015-04-09 DIAGNOSIS — M545 Low back pain: Secondary | ICD-10-CM

## 2015-04-13 ENCOUNTER — Encounter
Admission: RE | Admit: 2015-04-13 | Discharge: 2015-04-13 | Disposition: A | Discharge status: Home / Self Care | Payer: Medicare Other | Source: Ambulatory Visit | Attending: Orthopedic Surgery | Admitting: Orthopedic Surgery

## 2015-04-13 DIAGNOSIS — Y9289 Other specified places as the place of occurrence of the external cause: Secondary | ICD-10-CM | POA: Diagnosis not present

## 2015-04-13 DIAGNOSIS — M4856XA Collapsed vertebra, not elsewhere classified, lumbar region, initial encounter for fracture: Secondary | ICD-10-CM | POA: Diagnosis not present

## 2015-04-13 DIAGNOSIS — Y999 Unspecified external cause status: Secondary | ICD-10-CM | POA: Diagnosis not present

## 2015-04-13 DIAGNOSIS — X58XXXA Exposure to other specified factors, initial encounter: Secondary | ICD-10-CM | POA: Diagnosis not present

## 2015-04-13 DIAGNOSIS — Z888 Allergy status to other drugs, medicaments and biological substances status: Secondary | ICD-10-CM | POA: Diagnosis not present

## 2015-04-13 DIAGNOSIS — Z881 Allergy status to other antibiotic agents status: Secondary | ICD-10-CM | POA: Diagnosis not present

## 2015-04-13 DIAGNOSIS — Z7982 Long term (current) use of aspirin: Secondary | ICD-10-CM | POA: Diagnosis not present

## 2015-04-13 DIAGNOSIS — Z882 Allergy status to sulfonamides status: Secondary | ICD-10-CM | POA: Diagnosis not present

## 2015-04-13 DIAGNOSIS — Y939 Activity, unspecified: Secondary | ICD-10-CM | POA: Diagnosis not present

## 2015-04-13 DIAGNOSIS — I10 Essential (primary) hypertension: Secondary | ICD-10-CM | POA: Diagnosis not present

## 2015-04-13 DIAGNOSIS — E119 Type 2 diabetes mellitus without complications: Secondary | ICD-10-CM | POA: Diagnosis not present

## 2015-04-13 HISTORY — DX: Unspecified osteoarthritis, unspecified site: M19.90

## 2015-04-13 HISTORY — DX: Other cervical disc degeneration, unspecified cervical region: M50.30

## 2015-04-13 LAB — CBC
HEMATOCRIT: 34.4 % — AB (ref 35.0–47.0)
HEMOGLOBIN: 10.8 g/dL — AB (ref 12.0–16.0)
MCH: 26.8 pg (ref 26.0–34.0)
MCHC: 31.5 g/dL — ABNORMAL LOW (ref 32.0–36.0)
MCV: 85 fL (ref 80.0–100.0)
PLATELETS: 312 10*3/uL (ref 150–440)
RBC: 4.05 MIL/uL (ref 3.80–5.20)
RDW: 17.4 % — ABNORMAL HIGH (ref 11.5–14.5)
WBC: 7.2 10*3/uL (ref 3.6–11.0)

## 2015-04-13 LAB — SURGICAL PCR SCREEN
MRSA, PCR: NEGATIVE
Staphylococcus aureus: POSITIVE — AB

## 2015-04-13 LAB — POTASSIUM: Potassium: 3.1 mmol/L — ABNORMAL LOW (ref 3.5–5.1)

## 2015-04-13 MED ORDER — VANCOMYCIN HCL 500 MG IV SOLR
500.0000 mg | Freq: Once | INTRAVENOUS | Status: DC
  Filled 2015-04-13: qty 500

## 2015-04-13 MED ORDER — VANCOMYCIN HCL 1000 MG IV SOLR
500.0000 mg | Freq: Once | INTRAVENOUS | Status: DC
  Filled 2015-04-13: qty 1000

## 2015-04-13 NOTE — Pre-Procedure Instructions (Signed)
Message left with [facility] at Dr. Rudene Christians office regarding her allergy to Cephalosporins and preop order for Ancef at 1402.

## 2015-04-13 NOTE — Patient Instructions (Signed)
  Your procedure is scheduled on: Tuesday April 14, 2015. Report to Same Day Surgery. To find out your arrival time please call [phone] between 1PM - 3PM  today.  Remember: Instructions that are not followed completely may result in serious medical risk, up to and including death, or upon the discretion of your surgeon and anesthesiologist your surgery may need to be rescheduled.    __x__ 1. Do not eat food or drink liquids after midnight. No gum chewing or hard candies.     ____ 2. No Alcohol for 24 hours before or after surgery.   ____ 3. Bring all medications with you on the day of surgery if instructed.    __x__ 4. Notify your doctor if there is any change in your medical condition     (cold, fever, infections).     Do not wear jewelry, make-up, hairpins, clips or nail polish.  Do not wear lotions, powders, or perfumes. You may wear deodorant.  Do not shave 48 hours prior to surgery. Men may shave face and neck.  Do not bring valuables to the hospital.    [facility] is not responsible for any belongings or valuables.               Contacts, dentures or bridgework may not be worn into surgery.  Leave your suitcase in the car. After surgery it may be brought to your room.  For patients admitted to the hospital, discharge time is determined by your treatment team.   Patients discharged the day of surgery will not be allowed to drive home.    Please read over the following fact sheets that you were given:   [facility] Preparing for Surgery  __x__ Take these medicines the morning of surgery with A SIP OF WATER:    1. Prednisone  2. Omeprazole  3. Diphenoxylatel  4.Oxycodone   ____ Fleet Enema (as directed)   _x_ Use CHG Soap as directed  ____ Use inhalers on the day of surgery  ____ Stop metformin 2 days prior to surgery    ____ Take 1/2 of usual insulin dose the night before surgery and none on the morning of surgery.   _x___ Stop Coumadin/Plavix/aspirin on  today  _x__ Stop Anti-inflammatories Motrin now.   ____ Stop supplements until after surgery.    ____ Bring C-Pap to the hospital.

## 2015-04-14 ENCOUNTER — Ambulatory Visit: Payer: Medicare Other | Admitting: Registered Nurse

## 2015-04-14 ENCOUNTER — Ambulatory Visit
Admission: RE | Admit: 2015-04-14 | Discharge: 2015-04-14 | Disposition: A | Discharge status: Home / Self Care | Payer: Medicare Other | Source: Ambulatory Visit | Attending: Orthopedic Surgery | Admitting: Orthopedic Surgery

## 2015-04-14 ENCOUNTER — Ambulatory Visit: Payer: Medicare Other

## 2015-04-14 ENCOUNTER — Encounter: Payer: Self-pay | Admitting: *Deleted

## 2015-04-14 ENCOUNTER — Encounter
Admission: RE | Disposition: A | Discharge status: Home / Self Care | Payer: Medicare Other | Source: Ambulatory Visit | Attending: Orthopedic Surgery

## 2015-04-14 DIAGNOSIS — M4856XA Collapsed vertebra, not elsewhere classified, lumbar region, initial encounter for fracture: Secondary | ICD-10-CM | POA: Insufficient documentation

## 2015-04-14 DIAGNOSIS — Z881 Allergy status to other antibiotic agents status: Secondary | ICD-10-CM | POA: Insufficient documentation

## 2015-04-14 DIAGNOSIS — Z882 Allergy status to sulfonamides status: Secondary | ICD-10-CM | POA: Insufficient documentation

## 2015-04-14 DIAGNOSIS — Z7982 Long term (current) use of aspirin: Secondary | ICD-10-CM | POA: Insufficient documentation

## 2015-04-14 DIAGNOSIS — E119 Type 2 diabetes mellitus without complications: Secondary | ICD-10-CM | POA: Insufficient documentation

## 2015-04-14 DIAGNOSIS — Z888 Allergy status to other drugs, medicaments and biological substances status: Secondary | ICD-10-CM | POA: Insufficient documentation

## 2015-04-14 DIAGNOSIS — I10 Essential (primary) hypertension: Secondary | ICD-10-CM | POA: Insufficient documentation

## 2015-04-14 DIAGNOSIS — Y9289 Other specified places as the place of occurrence of the external cause: Secondary | ICD-10-CM | POA: Insufficient documentation

## 2015-04-14 DIAGNOSIS — X58XXXA Exposure to other specified factors, initial encounter: Secondary | ICD-10-CM | POA: Insufficient documentation

## 2015-04-14 DIAGNOSIS — Z419 Encounter for procedure for purposes other than remedying health state, unspecified: Secondary | ICD-10-CM

## 2015-04-14 DIAGNOSIS — Y999 Unspecified external cause status: Secondary | ICD-10-CM | POA: Insufficient documentation

## 2015-04-14 DIAGNOSIS — Y939 Activity, unspecified: Secondary | ICD-10-CM | POA: Insufficient documentation

## 2015-04-14 HISTORY — PX: KYPHOPLASTY: SHX5884

## 2015-04-14 LAB — POTASSIUM: POTASSIUM: 4.1 mmol/L (ref 3.5–5.1)

## 2015-04-14 SURGERY — KYPHOPLASTY
Anesthesia: Choice | Site: Back | Wound class: Clean

## 2015-04-14 MED ORDER — HYDROMORPHONE HCL 1 MG/ML IJ SOLN
INTRAMUSCULAR | Status: AC
  Administered 2015-04-14: 0.5 mg via INTRAVENOUS
  Filled 2015-04-14: qty 1

## 2015-04-14 MED ORDER — FENTANYL CITRATE (PF) 100 MCG/2ML IJ SOLN
INTRAMUSCULAR | Status: AC
  Administered 2015-04-14: 25 ug via INTRAVENOUS
  Filled 2015-04-14: qty 2

## 2015-04-14 MED ORDER — LIDOCAINE HCL 1 % IJ SOLN
INTRAMUSCULAR | Status: DC | PRN
  Administered 2015-04-14: 30 mL

## 2015-04-14 MED ORDER — HYDROMORPHONE HCL 1 MG/ML IJ SOLN
0.2500 mg | INTRAMUSCULAR | Status: DC | PRN
  Administered 2015-04-14 (×4): 0.5 mg via INTRAVENOUS

## 2015-04-14 MED ORDER — HYDROCODONE-ACETAMINOPHEN 5-325 MG PO TABS: 1.0000 | ORAL_TABLET | Freq: Four times a day (QID) | ORAL | Status: DC | PRN

## 2015-04-14 MED ORDER — SODIUM CHLORIDE 0.9 % IV SOLN: INTRAVENOUS | Status: DC

## 2015-04-14 MED ORDER — METOCLOPRAMIDE HCL 10 MG PO TABS: 5.0000 mg | ORAL_TABLET | Freq: Three times a day (TID) | ORAL | Status: DC | PRN

## 2015-04-14 MED ORDER — ONDANSETRON HCL 4 MG/2ML IJ SOLN: 4.0000 mg | Freq: Four times a day (QID) | INTRAMUSCULAR | Status: DC | PRN

## 2015-04-14 MED ORDER — MIDAZOLAM HCL 2 MG/2ML IJ SOLN
INTRAMUSCULAR | Status: DC | PRN
  Administered 2015-04-14: 2 mg via INTRAVENOUS

## 2015-04-14 MED ORDER — HYDROCODONE-ACETAMINOPHEN 5-325 MG PO TABS: 1.0000 | ORAL_TABLET | ORAL | Status: DC | PRN

## 2015-04-14 MED ORDER — VANCOMYCIN HCL 500 MG IV SOLR
500.0000 mg | Freq: Once | INTRAVENOUS | Status: AC
  Administered 2015-04-14: 500 mg via INTRAVENOUS
  Filled 2015-04-14: qty 500

## 2015-04-14 MED ORDER — FENTANYL CITRATE (PF) 100 MCG/2ML IJ SOLN
25.0000 ug | INTRAMUSCULAR | Status: DC | PRN
  Administered 2015-04-14 (×4): 25 ug via INTRAVENOUS

## 2015-04-14 MED ORDER — LIDOCAINE HCL (PF) 1 % IJ SOLN
INTRAMUSCULAR | Status: AC
  Filled 2015-04-14: qty 60

## 2015-04-14 MED ORDER — IOHEXOL 180 MG/ML  SOLN
INTRAMUSCULAR | Status: AC
  Filled 2015-04-14: qty 40

## 2015-04-14 MED ORDER — IOHEXOL 180 MG/ML  SOLN
INTRAMUSCULAR | Status: DC | PRN
Start: 2015-04-14 — End: 2015-04-14
  Administered 2015-04-14: 4 mL

## 2015-04-14 MED ORDER — METOCLOPRAMIDE HCL 5 MG/ML IJ SOLN: 5.0000 mg | Freq: Three times a day (TID) | INTRAMUSCULAR | Status: DC | PRN

## 2015-04-14 MED ORDER — KETAMINE HCL 50 MG/ML IV SOSY
PREFILLED_SYRINGE | INTRAVENOUS | Status: DC | PRN
  Administered 2015-04-14 (×2): 25 mg via INTRAVENOUS

## 2015-04-14 MED ORDER — LIDOCAINE HCL (CARDIAC) 20 MG/ML IV SOLN
INTRAVENOUS | Status: DC | PRN
  Administered 2015-04-14: 80 mg via INTRAVENOUS

## 2015-04-14 MED ORDER — BUPIVACAINE-EPINEPHRINE (PF) 0.5% -1:200000 IJ SOLN
INTRAMUSCULAR | Status: DC | PRN
  Administered 2015-04-14: 20 mL via PERINEURAL

## 2015-04-14 MED ORDER — ONDANSETRON HCL 4 MG/2ML IJ SOLN: 4.0000 mg | Freq: Once | INTRAMUSCULAR | Status: DC | PRN

## 2015-04-14 MED ORDER — LACTATED RINGERS IV SOLN
INTRAVENOUS | Status: DC
  Administered 2015-04-14: 16:00:00 via INTRAVENOUS

## 2015-04-14 MED ORDER — ONDANSETRON HCL 4 MG PO TABS: 4.0000 mg | ORAL_TABLET | Freq: Four times a day (QID) | ORAL | Status: DC | PRN

## 2015-04-14 MED ORDER — BUPIVACAINE-EPINEPHRINE (PF) 0.5% -1:200000 IJ SOLN
INTRAMUSCULAR | Status: AC
  Filled 2015-04-14: qty 30

## 2015-04-14 MED ORDER — PROPOFOL INFUSION 10 MG/ML OPTIME
INTRAVENOUS | Status: DC | PRN
  Administered 2015-04-14: 50 ug/kg/min via INTRAVENOUS

## 2015-04-14 SURGICAL SUPPLY — 14 items
CEMENT BONE KYPHON CDS (Cement) ×3 IMPLANT
CEMENT KYPHON CX01A KIT/MIXER (Cement) ×3 IMPLANT
DEVICE BIOPSY BONE KYPHX (INSTRUMENTS) ×6 IMPLANT
DRAPE C-ARM XRAY 36X54 (DRAPES) ×3 IMPLANT
DURAPREP 26ML APPLICATOR (WOUND CARE) ×3 IMPLANT
GLOVE SURG ORTHO 9.0 STRL STRW (GLOVE) ×3 IMPLANT
GOWN SPECIALTY ULTRA XL (MISCELLANEOUS) ×3 IMPLANT
GOWN STRL REUS W/ TWL LRG LVL3 (GOWN DISPOSABLE) ×1 IMPLANT
GOWN STRL REUS W/TWL LRG LVL3 (GOWN DISPOSABLE) ×2
LIQUID BAND (GAUZE/BANDAGES/DRESSINGS) ×3 IMPLANT
PACK KYPHOPLASTY (MISCELLANEOUS) ×3 IMPLANT
STRAP SAFETY BODY (MISCELLANEOUS) ×3 IMPLANT
TRAY KYPHOPAK 15/3 EXPRESS 1ST (MISCELLANEOUS) ×3 IMPLANT
TRAY KYPHOPAK 20/3 EXPRESS 1ST (MISCELLANEOUS) ×3 IMPLANT

## 2015-04-14 NOTE — H&P (Signed)
Reviewed paper H+P, will be scanned into chart. No changes noted.  

## 2015-04-14 NOTE — Transfer of Care (Signed)
Immediate Anesthesia Transfer of Care Note  Patient: Nicole Bass  Procedure(s) Performed: Procedure(s): KYPHOPLASTY (N/A)  Patient Location: PACU  Anesthesia Type:MAC  Level of Consciousness: awake, alert , oriented and patient cooperative  Airway & Oxygen Therapy: Patient Spontanous Breathing and Patient connected to nasal cannula oxygen  Post-op Assessment: Report given to RN  Post vital signs: Reviewed and stable  Last Vitals:  Filed Vitals:   04/14/15 1807  BP: 151/91  Pulse: 106  Temp: 36.2 C  Resp: 13    Complications: No apparent anesthesia complications

## 2015-04-14 NOTE — Op Note (Signed)
04/14/2015  18:07  PATIENT:  Nicole Bass  64 y.o. female  PRE-OPERATIVE DIAGNOSIS:  L1 COMPRESSION fracture  POST-OPERATIVE DIAGNOSIS:  L1 COMPRESSION fracture   PROCEDURE: Procedure(s): Kyphoplasty L1 with biopsy (N/A)  SURGEON: Laurene Footman, MD  ASSISTANTS: None  ANESTHESIA: MAC  EBL: Total I/O In: 500 [I.V.:500]  BLOOD ADMINISTERED:none  DRAINS: none   LOCAL MEDICATIONS USED: MARCAINE and XYLOCAINE   SPECIMEN: Source of Specimen: L1 vertebral body  DISPOSITION OF SPECIMEN: PATHOLOGY  COUNTS: YES  TOURNIQUET: * No tourniquets in log *  IMPLANTS: Bone cement  DICTATION: .Dragon Dictation patient was brought to the operating room and after adequate sedation was given, the patient was placed prone. AP and lateral imaging of the affected vertebral body level was obtained. Timeout procedure was completed along patient identification 10 cc of Xylocaine 1% plain was infiltrated subcutaneously on the right and left sides for a L1 kyphoplasty. The back was then prepped and draped in sterile fashion and repeat timeout procedure carried out. Local asthenic was infiltrated down the pedicle through a spinal needle on both sides. Small incision was made first on the right than the left trochars advanced into the vertebral body care being taken to stay lateral to the pedicle and out of the neural foramen and out of the spinal canal with frequent fluoroscopic imaging. Biopsy was obtained drilling carried out and balloon was inflated at each side approximately 4 cc inflation. After this had been done the cement was mixed and was the appropriate consistency approximately 6 cc of bone cement was infiltrated with good and good interdigitation and fill of the vertebral body. The trochars removed and permanent C-arm views obtained. The wounds were closed with Dermabond followed by Band-Aids and patient center comes stable condition  PLAN OF CARE: Continues  inpatient  PATIENT DISPOSITION: PACU - hemodynamically stable.

## 2015-04-14 NOTE — Anesthesia Preprocedure Evaluation (Signed)
Anesthesia Evaluation  Patient identified by MRN, date of birth, ID band Patient awake    Reviewed: Allergy & Precautions, H&P , NPO status , Patient's Chart, lab work & pertinent test results, reviewed documented beta blocker date and time   Airway Mallampati: II  TM Distance: >3 FB Neck ROM: full    Dental no notable dental hx. (+) Teeth Intact   Pulmonary neg pulmonary ROS,  breath sounds clear to auscultation  Pulmonary exam normal       Cardiovascular Exercise Tolerance: Good hypertension, negative cardio ROS  Rhythm:regular Rate:Normal     Neuro/Psych negative neurological ROS  negative psych ROS   GI/Hepatic negative GI ROS, Neg liver ROS,   Endo/Other  negative endocrine ROSdiabetes  Renal/GU      Musculoskeletal   Abdominal   Peds  Hematology negative hematology ROS (+)   Anesthesia Other Findings   Reproductive/Obstetrics negative OB ROS                             Anesthesia Physical Anesthesia Plan  ASA: II and emergent  Anesthesia Plan: MAC   Post-op Pain Management:    Induction:   Airway Management Planned:   Additional Equipment:   Intra-op Plan:   Post-operative Plan:   Informed Consent: I have reviewed the patients History and Physical, chart, labs and discussed the procedure including the risks, benefits and alternatives for the proposed anesthesia with the patient or authorized representative who has indicated his/her understanding and acceptance.     Plan Discussed with: CRNA  Anesthesia Plan Comments:         Anesthesia Quick Evaluation

## 2015-04-14 NOTE — Discharge Instructions (Signed)

## 2015-04-15 ENCOUNTER — Encounter: Payer: Self-pay | Admitting: Orthopedic Surgery

## 2015-04-15 MED FILL — Ketamine HCl Inj 50 MG/ML: INTRAMUSCULAR | Qty: 0.5 | Status: AC

## 2015-04-17 LAB — SURGICAL PATHOLOGY

## 2015-04-23 NOTE — Anesthesia Postprocedure Evaluation (Signed)
  Anesthesia Post-op Note  Patient: Nicole Bass  Procedure(s) Performed: Procedure(s): KYPHOPLASTY (N/A)  Anesthesia type:No value filed.  Patient location: PACU  Post pain: Pain level controlled  Post assessment: Post-op Vital signs reviewed, Patient's Cardiovascular Status Stable, Respiratory Function Stable, Patent Airway and No signs of Nausea or vomiting  Post vital signs: Reviewed and stable  Last Vitals:  Filed Vitals:   04/14/15 1907  BP: 139/72  Pulse: 87  Temp:   Resp: 16    Level of consciousness: awake, alert  and patient cooperative  Complications: No apparent anesthesia complications

## 2015-05-28 ENCOUNTER — Other Ambulatory Visit: Payer: Self-pay | Admitting: Orthopedic Surgery

## 2015-05-28 DIAGNOSIS — Z9889 Other specified postprocedural states: Secondary | ICD-10-CM

## 2015-05-28 DIAGNOSIS — M545 Low back pain, unspecified: Secondary | ICD-10-CM

## 2015-06-04 ENCOUNTER — Ambulatory Visit: Payer: Medicare Other

## 2015-06-04 ENCOUNTER — Ambulatory Visit: Payer: Medicare Other | Admitting: Anesthesiology

## 2015-06-04 ENCOUNTER — Ambulatory Visit
Admission: RE | Admit: 2015-06-04 | Discharge: 2015-06-04 | Disposition: A | Discharge status: Home / Self Care | Payer: Medicare Other | Source: Ambulatory Visit | Attending: Orthopedic Surgery | Admitting: Orthopedic Surgery

## 2015-06-04 ENCOUNTER — Encounter
Admission: RE | Disposition: A | Discharge status: Home / Self Care | Payer: Self-pay | Source: Ambulatory Visit | Attending: Orthopedic Surgery

## 2015-06-04 ENCOUNTER — Encounter: Payer: Self-pay | Admitting: *Deleted

## 2015-06-04 DIAGNOSIS — Z885 Allergy status to narcotic agent status: Secondary | ICD-10-CM | POA: Diagnosis not present

## 2015-06-04 DIAGNOSIS — Z888 Allergy status to other drugs, medicaments and biological substances status: Secondary | ICD-10-CM | POA: Diagnosis not present

## 2015-06-04 DIAGNOSIS — K5090 Crohn's disease, unspecified, without complications: Secondary | ICD-10-CM | POA: Diagnosis not present

## 2015-06-04 DIAGNOSIS — K219 Gastro-esophageal reflux disease without esophagitis: Secondary | ICD-10-CM | POA: Insufficient documentation

## 2015-06-04 DIAGNOSIS — Z881 Allergy status to other antibiotic agents status: Secondary | ICD-10-CM | POA: Insufficient documentation

## 2015-06-04 DIAGNOSIS — Z882 Allergy status to sulfonamides status: Secondary | ICD-10-CM | POA: Diagnosis not present

## 2015-06-04 DIAGNOSIS — Z419 Encounter for procedure for purposes other than remedying health state, unspecified: Secondary | ICD-10-CM

## 2015-06-04 DIAGNOSIS — M5030 Other cervical disc degeneration, unspecified cervical region: Secondary | ICD-10-CM | POA: Diagnosis not present

## 2015-06-04 DIAGNOSIS — M4856XA Collapsed vertebra, not elsewhere classified, lumbar region, initial encounter for fracture: Secondary | ICD-10-CM | POA: Diagnosis not present

## 2015-06-04 DIAGNOSIS — M069 Rheumatoid arthritis, unspecified: Secondary | ICD-10-CM | POA: Diagnosis not present

## 2015-06-04 HISTORY — PX: KYPHOPLASTY: SHX5884

## 2015-06-04 SURGERY — KYPHOPLASTY
Anesthesia: General | Site: Back | Wound class: Clean

## 2015-06-04 MED ORDER — PROMETHAZINE HCL 25 MG/ML IJ SOLN
6.2500 mg | INTRAMUSCULAR | Status: DC | PRN
  Administered 2015-06-04: 12.5 mg via INTRAVENOUS

## 2015-06-04 MED ORDER — LIDOCAINE HCL (PF) 1 % IJ SOLN
INTRAMUSCULAR | Status: DC
Start: 2015-06-04 — End: 2015-06-04
  Filled 2015-06-04: qty 2

## 2015-06-04 MED ORDER — LACTATED RINGERS IV SOLN
INTRAVENOUS | Status: DC
  Administered 2015-06-04: 09:00:00 via INTRAVENOUS

## 2015-06-04 MED ORDER — LORAZEPAM 2 MG/ML IJ SOLN
INTRAMUSCULAR | Status: AC
  Filled 2015-06-04: qty 1

## 2015-06-04 MED ORDER — SODIUM CHLORIDE 0.9 % IJ SOLN
INTRAMUSCULAR | Status: DC
Start: 2015-06-04 — End: 2015-06-04
  Filled 2015-06-04: qty 10

## 2015-06-04 MED ORDER — CLINDAMYCIN PHOSPHATE 900 MG/50ML IV SOLN
INTRAVENOUS | Status: AC
  Administered 2015-06-04: 900 mg via INTRAVENOUS
  Filled 2015-06-04: qty 50

## 2015-06-04 MED ORDER — FENTANYL CITRATE (PF) 100 MCG/2ML IJ SOLN
INTRAMUSCULAR | Status: DC | PRN
  Administered 2015-06-04 (×2): 50 ug via INTRAVENOUS

## 2015-06-04 MED ORDER — BUPIVACAINE-EPINEPHRINE (PF) 0.5% -1:200000 IJ SOLN
INTRAMUSCULAR | Status: DC | PRN
  Administered 2015-06-04: 20 mL via PERINEURAL

## 2015-06-04 MED ORDER — MIDAZOLAM HCL 2 MG/2ML IJ SOLN
2.0000 mg | Freq: Once | INTRAMUSCULAR | Status: AC
  Administered 2015-06-04: 2 mg via NASAL

## 2015-06-04 MED ORDER — SODIUM CHLORIDE 0.9 % IV SOLN: INTRAVENOUS | Status: DC

## 2015-06-04 MED ORDER — METOCLOPRAMIDE HCL 5 MG/ML IJ SOLN: 5.0000 mg | Freq: Three times a day (TID) | INTRAMUSCULAR | Status: DC | PRN

## 2015-06-04 MED ORDER — IOHEXOL 180 MG/ML  SOLN
INTRAMUSCULAR | Status: AC
  Filled 2015-06-04: qty 40

## 2015-06-04 MED ORDER — ONDANSETRON HCL 4 MG PO TABS: 4.0000 mg | ORAL_TABLET | Freq: Four times a day (QID) | ORAL | Status: DC | PRN

## 2015-06-04 MED ORDER — IOHEXOL 180 MG/ML  SOLN
INTRAMUSCULAR | Status: DC | PRN
  Administered 2015-06-04: 20 mL via INTRAVENOUS

## 2015-06-04 MED ORDER — LIDOCAINE HCL (PF) 1 % IJ SOLN
INTRAMUSCULAR | Status: AC
Start: 2015-06-04 — End: 2015-06-04
  Filled 2015-06-04: qty 60

## 2015-06-04 MED ORDER — LIDOCAINE HCL 1 % IJ SOLN
INTRAMUSCULAR | Status: DC | PRN
  Administered 2015-06-04: 20 mL

## 2015-06-04 MED ORDER — METOCLOPRAMIDE HCL 10 MG PO TABS: 5.0000 mg | ORAL_TABLET | Freq: Three times a day (TID) | ORAL | Status: DC | PRN

## 2015-06-04 MED ORDER — ONDANSETRON HCL 4 MG/2ML IJ SOLN: 4.0000 mg | Freq: Four times a day (QID) | INTRAMUSCULAR | Status: DC | PRN

## 2015-06-04 MED ORDER — ONDANSETRON 4 MG PO TBDP
ORAL_TABLET | ORAL | Status: AC
  Filled 2015-06-04: qty 1

## 2015-06-04 MED ORDER — PROPOFOL 500 MG/50ML IV EMUL
INTRAVENOUS | Status: DC | PRN
  Administered 2015-06-04: 100 ug/kg/min via INTRAVENOUS

## 2015-06-04 MED ORDER — LORAZEPAM 2 MG/ML IJ SOLN
0.5000 mg | Freq: Once | INTRAMUSCULAR | Status: AC
  Administered 2015-06-04: 0.5 mg via INTRAVENOUS

## 2015-06-04 MED ORDER — HYDROCODONE-ACETAMINOPHEN 5-325 MG PO TABS: 1.0000 | ORAL_TABLET | Freq: Four times a day (QID) | ORAL | Status: DC | PRN

## 2015-06-04 MED ORDER — ONDANSETRON 4 MG PO TBDP
4.0000 mg | ORAL_TABLET | Freq: Once | ORAL | Status: AC
  Administered 2015-06-04: 4 mg via ORAL

## 2015-06-04 MED ORDER — SODIUM CHLORIDE 0.9 % IJ SOLN
INTRAMUSCULAR | Status: AC
  Filled 2015-06-04: qty 3

## 2015-06-04 MED ORDER — LIDOCAINE HCL (PF) 1 % IJ SOLN
INTRAMUSCULAR | Status: AC
  Filled 2015-06-04: qty 2

## 2015-06-04 MED ORDER — HYDROCODONE-ACETAMINOPHEN 5-325 MG PO TABS
1.0000 | ORAL_TABLET | ORAL | Status: DC | PRN
  Administered 2015-06-04: 2 via ORAL

## 2015-06-04 MED ORDER — FENTANYL CITRATE (PF) 100 MCG/2ML IJ SOLN
INTRAMUSCULAR | Status: AC
  Administered 2015-06-04: 25 ug via INTRAVENOUS
  Filled 2015-06-04: qty 2

## 2015-06-04 MED ORDER — PROMETHAZINE HCL 25 MG/ML IJ SOLN
INTRAMUSCULAR | Status: AC
  Filled 2015-06-04: qty 1

## 2015-06-04 MED ORDER — FENTANYL CITRATE (PF) 100 MCG/2ML IJ SOLN
25.0000 ug | INTRAMUSCULAR | Status: DC | PRN
  Administered 2015-06-04 (×2): 25 ug via INTRAVENOUS
  Administered 2015-06-04: 50 ug via INTRAVENOUS

## 2015-06-04 MED ORDER — BUPIVACAINE-EPINEPHRINE (PF) 0.5% -1:200000 IJ SOLN
INTRAMUSCULAR | Status: AC
  Filled 2015-06-04: qty 30

## 2015-06-04 MED ORDER — MIDAZOLAM HCL 2 MG/2ML IJ SOLN
INTRAMUSCULAR | Status: AC
  Administered 2015-06-04: 2 mg via NASAL
  Filled 2015-06-04: qty 2

## 2015-06-04 MED ORDER — HYDROCODONE-ACETAMINOPHEN 5-325 MG PO TABS
ORAL_TABLET | ORAL | Status: AC
  Filled 2015-06-04: qty 2

## 2015-06-04 SURGICAL SUPPLY — 13 items
CEMENT KYPHON CX01A KIT/MIXER (Cement) ×3 IMPLANT
DEVICE BIOPSY BONE KYPHX (INSTRUMENTS) ×3 IMPLANT
DRAPE C-ARM XRAY 36X54 (DRAPES) ×3 IMPLANT
DURAPREP 26ML APPLICATOR (WOUND CARE) ×6 IMPLANT
GLOVE SURG ORTHO 9.0 STRL STRW (GLOVE) ×9 IMPLANT
GOWN SPECIALTY ULTRA XL (MISCELLANEOUS) ×3 IMPLANT
GOWN STRL REUS W/ TWL LRG LVL3 (GOWN DISPOSABLE) ×1 IMPLANT
GOWN STRL REUS W/TWL LRG LVL3 (GOWN DISPOSABLE) ×2
LIQUID BAND (GAUZE/BANDAGES/DRESSINGS) ×3 IMPLANT
PACK KYPHOPLASTY (MISCELLANEOUS) ×3 IMPLANT
STRAP SAFETY BODY (MISCELLANEOUS) IMPLANT
TRAY KYPHOPAK 15/3 EXPRESS 1ST (MISCELLANEOUS) IMPLANT
TRAY KYPHOPAK 20/3 EXPRESS 1ST (MISCELLANEOUS) ×3 IMPLANT

## 2015-06-04 NOTE — Discharge Instructions (Addendum)
Remove Band-Aid's on Saturday and okay to shower at that time. Minimize activities today normal activities as tolerated tomorrowAMBULATORY SURGERY  DISCHARGE INSTRUCTIONS   1) The drugs that you were given will stay in your system until tomorrow so for the next 24 hours you should not:  A) Drive an automobile B) Make any legal decisions C) Drink any alcoholic beverage   2) You may resume regular meals tomorrow.  Today it is better to start with liquids and gradually work up to solid foods.  You may eat anything you prefer, but it is better to start with liquids, then soup and crackers, and gradually work up to solid foods.   3) Please notify your doctor immediately if you have any unusual bleeding, trouble breathing, redness and pain at the surgery site, drainage, fever, or pain not relieved by medication.    4) Additional Instructions:        Please contact your physician with any problems or Same Day Surgery at [phone], Monday through Friday 6 am to 4 pm, or [place] at [facility] number at [phone].

## 2015-06-04 NOTE — Transfer of Care (Signed)
Immediate Anesthesia Transfer of Care Note  Patient: Nicole Bass  Procedure(s) Performed: Procedure(s): KYPHOPLASTY L-5 (N/A)  Patient Location: PACU  Anesthesia Type:General  Level of Consciousness: awake, alert  and oriented  Airway & Oxygen Therapy: Patient Spontanous Breathing  Post-op Assessment: Report given to RN and Post -op Vital signs reviewed and stable  Post vital signs: Reviewed and stable  Last Vitals:  Filed Vitals:   06/04/15 0752  BP: 146/83  Pulse: 86  Temp: 36.7 C  Resp: 16    Complications: No apparent anesthesia complications

## 2015-06-04 NOTE — Progress Notes (Signed)
Pt. Very difficult iv stick, several attempts unsuccessful, pt. Screaming and crying.  Unable to obtain iv Access.  Patient comforted and consoled .

## 2015-06-04 NOTE — Op Note (Signed)
06/04/2015  10:05 AM  PATIENT:  Nicole Bass  64 y.o. female  PRE-OPERATIVE DIAGNOSIS:  L5 COMPRESSION FRACTURE  POST-OPERATIVE DIAGNOSIS:  L5 COMPRESSION FRACTURE  PROCEDURE:  Procedure(s): KYPHOPLASTY L-5 (N/A)  SURGEON: Laurene Footman, MD  ASSISTANTS: None  ANESTHESIA:   local and MAC  EBL:  Total I/O In: 50 [I.V.:50] Out: -   BLOOD ADMINISTERED:none  DRAINS: none   LOCAL MEDICATIONS USED:  MARCAINE    and LIDOCAINE   SPECIMEN:  Source of Specimen:  L5 vertebral body  DISPOSITION OF SPECIMEN:  PATHOLOGY  COUNTS:  YES  TOURNIQUET:  * No tourniquets in log *  IMPLANTS: Bone cement  DICTATION: .Dragon Dictation patient was brought to the operating room and after adequate sedation was given the patient was placed prone. C-arm was brought in and good visualization of L5 in both AP and lateral projections was obtained. Appropriate patient education timeout procedures were completed, local anesthetic was then infiltrated on the right and left sides after prepping the skin with alcohol. The back was then prepped and draped in sterile fashion and repeat timeout procedure. Carried out. Spinal needle was then placed on the each side getting down to the pedicle and giving local anesthetic with a 50-50 mix 1% Xylocaine have percent Sensorcaine with epinephrine total of 20 cc of each side. Small incision was made and a trocar advanced into the vertebral body on the left side first and specimen obtained care was taken on advancing the trocar that it stayed lateral to the medial wall the pedicle until the vertebral body was entered drilling was carried out and a balloon inserted and inflated this is only on the left side and across midline so second incision was made on the right side trocar day. Past in identical fashion but no biopsy obtained on the right side drilling was carried out and balloon inflated on the right as well cement was mixed and when it was appropriate consistency  the vertebral body was filled with approximately 8 cc of bone cement getting very good fill without any extravasation after the cement was set trochars removed and permanent C-arm views obtained Dermabond were used to close the skin the band Band-Aids and over the incision the patient center comes stable condition EBL minimal competitions none specimen L5 vertebral body biopsy  PLAN OF CARE: Discharge to home after PACU  PATIENT DISPOSITION:  PACU - hemodynamically stable.

## 2015-06-04 NOTE — H&P (Signed)
Reviewed paper H+P, will be scanned into chart. No changes noted. MRI obtained at outside institution shows new L5 compression fracture with extensive edema. That is the level being treated today.

## 2015-06-04 NOTE — Anesthesia Postprocedure Evaluation (Signed)
  Anesthesia Post-op Note  Patient: Nicole Bass  Procedure(s) Performed: Procedure(s): KYPHOPLASTY L-5 (N/A)  Anesthesia type:General  Patient location: PACU  Post pain: Pain level controlled  Post assessment: Post-op Vital signs reviewed, Patient's Cardiovascular Status Stable, Respiratory Function Stable, Patent Airway and No signs of Nausea or vomiting  Post vital signs: Reviewed and stable  Last Vitals:  Filed Vitals:   06/04/15 1159  BP: 128/68  Pulse: 102  Temp:   Resp: 16    Level of consciousness: awake, alert  and patient cooperative  Complications: No apparent anesthesia complications

## 2015-06-04 NOTE — Anesthesia Preprocedure Evaluation (Signed)
Anesthesia Evaluation  Patient identified by MRN, date of birth, ID band Patient awake    Reviewed: Allergy & Precautions, H&P , NPO status , Patient's Chart, lab work & pertinent test results, reviewed documented beta blocker date and time   History of Anesthesia Complications Negative for: history of anesthetic complications  Airway Mallampati: III  TM Distance: >3 FB Neck ROM: full    Dental no notable dental hx. (+) Edentulous Upper, Implants   Pulmonary neg pulmonary ROS,    Pulmonary exam normal breath sounds clear to auscultation       Cardiovascular Exercise Tolerance: Good negative cardio ROS Normal cardiovascular exam Rhythm:regular Rate:Normal     Neuro/Psych negative neurological ROS  negative psych ROS   GI/Hepatic Neg liver ROS, GERD  Medicated,Crohn's disease   Endo/Other  negative endocrine ROS  Renal/GU negative Renal ROS  negative genitourinary   Musculoskeletal   Abdominal   Peds  Hematology negative hematology ROS (+)   Anesthesia Other Findings Past Medical History:   Rheumatoid Arthritis                                                    Degenerative disc disease, cervical                          Reproductive/Obstetrics negative OB ROS                             Anesthesia Physical Anesthesia Plan  ASA: III  Anesthesia Plan: General   Post-op Pain Management:    Induction:   Airway Management Planned:   Additional Equipment:   Intra-op Plan:   Post-operative Plan:   Informed Consent: I have reviewed the patients History and Physical, chart, labs and discussed the procedure including the risks, benefits and alternatives for the proposed anesthesia with the patient or authorized representative who has indicated his/her understanding and acceptance.   Dental Advisory Given  Plan Discussed with: Anesthesiologist, CRNA and Surgeon  Anesthesia Plan  Comments:         Anesthesia Quick Evaluation

## 2015-06-04 NOTE — OR Nursing (Signed)
Arrived to Pacu, crying, anxious, 10/10 pain.  Very anxious preop also.  Fentanyl for pain 14mg, no relief, Dr KRosey Bath Notified, ativan order received, and given.

## 2015-06-05 LAB — SURGICAL PATHOLOGY

## 2015-06-08 ENCOUNTER — Ambulatory Visit: Payer: Medicare Other | Admitting: Physical Therapy

## 2015-06-10 ENCOUNTER — Encounter: Payer: Medicare Other | Admitting: Physical Therapy

## 2015-06-15 ENCOUNTER — Ambulatory Visit: Payer: Medicare Other | Admitting: Physical Therapy

## 2015-06-16 ENCOUNTER — Ambulatory Visit
Admission: RE | Admit: 2015-06-16 | Discharge: 2015-06-16 | Disposition: A | Discharge status: Home / Self Care | Payer: Medicare Other | Source: Ambulatory Visit | Attending: Orthopedic Surgery | Admitting: Orthopedic Surgery

## 2015-06-16 DIAGNOSIS — M4856XA Collapsed vertebra, not elsewhere classified, lumbar region, initial encounter for fracture: Secondary | ICD-10-CM | POA: Diagnosis not present

## 2015-06-16 DIAGNOSIS — M47896 Other spondylosis, lumbar region: Secondary | ICD-10-CM | POA: Insufficient documentation

## 2015-06-16 DIAGNOSIS — M545 Low back pain, unspecified: Secondary | ICD-10-CM

## 2015-06-16 DIAGNOSIS — Z9889 Other specified postprocedural states: Secondary | ICD-10-CM

## 2015-06-17 ENCOUNTER — Ambulatory Visit: Payer: Medicare Other | Admitting: Physical Therapy

## 2015-07-01 ENCOUNTER — Encounter: Payer: Self-pay | Admitting: Physical Therapy

## 2015-07-01 ENCOUNTER — Ambulatory Visit: Payer: Medicare Other | Attending: Otolaryngology | Admitting: Physical Therapy

## 2015-07-01 DIAGNOSIS — R42 Dizziness and giddiness: Secondary | ICD-10-CM | POA: Diagnosis present

## 2015-07-01 DIAGNOSIS — R29898 Other symptoms and signs involving the musculoskeletal system: Secondary | ICD-10-CM | POA: Insufficient documentation

## 2015-07-01 DIAGNOSIS — R269 Unspecified abnormalities of gait and mobility: Secondary | ICD-10-CM | POA: Diagnosis present

## 2015-07-01 DIAGNOSIS — R296 Repeated falls: Secondary | ICD-10-CM | POA: Diagnosis not present

## 2015-07-02 NOTE — Therapy (Signed)
[place] MAIN [facility] SERVICES [address] [place], [place], [address] Phone: [phone]   Fax:  [phone]  Physical Therapy Evaluation  Patient Details  Name: Nicole Bass MRN: [id number] Date of Birth: Nov 07, 1950 Referring Provider: Carloyn Manner  Encounter Date: 07/01/2015      PT End of Session - 07/02/15 0816    Visit Number 1   Number of Visits 9   Date for PT Re-Evaluation 07/29/15   Authorization Type gcode 1   Authorization Time Period 10   PT Start Time 1110   PT Stop Time 1210   PT Time Calculation (min) 60 min   Equipment Utilized During Treatment Gait belt   Activity Tolerance Patient tolerated treatment well   Behavior During Therapy [facility] for tasks assessed/performed      Past Medical History  Diagnosis Date  . Arthritis   . Degenerative disc disease, cervical   . Crohn's disease ([place])   . Anemia     Past Surgical History  Procedure Laterality Date  . Back surgery    . Kyphoplasty    . Wrist fracture surgery Bilateral   . Abdominal hysterectomy    . Cesarean section  [place]  . Tonsillectomy    . Appendectomy    . Cholecystectomy    . Dilation and curettage of uterus  2004  . Elbow fracture surgery  2008  . Toe surgery  2013  . Joint replacement  2015    partial knee replacement  . Kyphoplasty N/A 04/14/2015    Procedure: KYPHOPLASTY;  Surgeon: Hessie Knows, MD;  Location: [facility] ORS;  Service: Orthopedics;  Laterality: N/A;  . Kyphoplasty N/A 06/04/2015    Procedure: KYPHOPLASTY L-5;  Surgeon: Hessie Knows, MD;  Location: [facility] ORS;  Service: Orthopedics;  Laterality: N/A;    There were no vitals filed for this visit.  Visit Diagnosis:  Repeated falls - Plan: PT plan of care cert/re-cert  Leg weakness, bilateral - Plan: PT plan of care cert/re-cert  Dizziness and giddiness - Plan: PT plan of care cert/re-cert  Abnormality of gait - Plan: PT plan of care cert/re-cert      Subjective  Assessment - 07/01/15 1118    Subjective Patient reports to PT due to mild dizziness and falls for reduced balance. Patient reports that she has had multiple surgeries within the past year secondary to multiple falls; reconstruction of L1 and L5 vertebra. She also report that she had a partial knee replacement in December 2015 . She feels like she started to be off balance following knee surgery. Within the past year she reports that she feels like her balance has progressively gotten worse, causing an increased fear of falling. Patient reports that she had injections into the back for pain reduction with very strong improvements. At worst her back pain was 10/10 prior to lumbar injection. With sitting there is no pain.    Pertinent History 3 falls within the past year resulting in 2 back surgeries and bilateral wrist surgeries for fx. History of BPPV     Limitations Standing;Walking;House hold activities;Lifting   How long can you stand comfortably? less than 5 minutes    How long can you walk comfortably? 15- 20 minutes    Patient Stated Goals Improve balance, decreae back pain,    Currently in Pain? No/denies            [facility] PT Assessment - 07/02/15 0001    Assessment   Medical Diagnosis dizziness  and falls    Referring Provider Creighton Vaught   Onset Date/Surgical Date 05/10/14   Hand Dominance Right   Next MD Visit none scheduled   Prior Therapy PT for knee replacement in 2015    Precautions   Precautions Fall   Balance Screen   Has the patient fallen in the past 6 months Yes   How many times? 2   Has the patient had a decrease in activity level because of a fear of falling?  Yes   Is the patient reluctant to leave their home because of a fear of falling?  No   Home Social worker Private residence   Living Arrangements Alone   Available Help at Discharge Family;Friend(s)   Type of [place] to enter;Elevator   Entrance  Stairs-Number of Steps 2   Home Layout One level   [place] - 2 wheels;Cane - single point   Additional Comments does not use cane or walker    Prior Function   Level of Independence Independent   Vocation Retired   Public affairs consultant booking, ride in Therapist, occupational   Overall Cognitive Status Within Functional Limits for tasks assessed   Observation/Other Assessments   Activities of Balance Confidence Scale (ABC Scale)  40% (<50% indicates low level of physical functioning)    Sensation   Light Touch Appears Intact   Coordination   Gross Motor Movements are Fluid and Coordinated Yes   Fine Motor Movements are Fluid and Coordinated Yes   Posture/Postural Control   Posture Comments Patient sits and stands with mild increase in forward head and increase thoracic kyphosis   AROM   Overall AROM Comments All UE and LE AROM WNL   Strength   Overall Strength Comments All LE motions 5/5 excepts R hip flexion  4+/ 5, L hip flexion 4/5 and L hip abduction 4+/5    Palpation   Palpation comment mild ternderness to the lumbar spine and L sided sacrum    Special Tests    Special Tests Lumbar   Lumbar Tests Straight Leg Raise;FABER test   FABER test   findings Negative   Side LEft   Straight Leg Raise   Findings Positive   Side  Left   Comment Increased pain in the L hip that radiated down posterior L LE.    Ambulation/Gait   Gait Comments Patient ambulates with decreased step length, and decreased step  height. minimal trunk and pelvic rotation. Patient reports increased L sided Low back pain and that radiates into the L hip.    Standardized Balance Assessment   Five times sit to stand comments  14 seconds ( >36 years old, <15 seconds indicates decreased fall risk)    10 Meter Walk .73ms ( <1.0 m/s indicates increased fall risk )    Berg Balance Test   Sit to Stand Able to stand without using hands and stabilize independently   Standing Unsupported Able to stand safely 2 minutes    Sitting with Back Unsupported but Feet Supported on Floor or Stool Able to sit safely and securely 2 minutes   Stand to Sit Sits safely with minimal use of hands   Transfers Able to transfer safely, minor use of hands   Standing Unsupported with Eyes Closed Able to stand 10 seconds with supervision   Standing Ubsupported with Feet Together Able to place feet together independently and stand 1 minute safely   From  Standing, Reach Forward with Outstretched Arm Can reach confidently >25 cm (10")   From Standing Position, Pick up Object from Floor Able to pick up shoe safely and easily   From Standing Position, Turn to Look Behind Over each Shoulder Looks behind from both sides and weight shifts well   Turn 360 Degrees Able to turn 360 degrees safely in 4 seconds or less   Standing Unsupported, Alternately Place Feet on Step/Stool Able to stand independently and safely and complete 8 steps in 20 seconds   Standing Unsupported, One Foot in Front Able to place foot tandem independently and hold 30 seconds   Standing on One Leg Able to lift leg independently and hold 5-10 seconds   Total Score 54   Berg comment: >46 indicates decreased fall risk    Dynamic Gait Index   Level Surface Normal   Change in Gait Speed Normal   Gait with Horizontal Head Turns Mild Impairment   Gait with Vertical Head Turns Mild Impairment   Gait and Pivot Turn Mild Impairment   Step Over Obstacle Mild Impairment   Step Around Obstacles Normal   Steps Mild Impairment   Total Score 19   DGI comment: >19 indicates decreased fall risk   High Level Balance   High Level Balance Comments Patient demonstrated increased difficulty standing on compliant surface (airex pad) needing occasional UE support to maintain balance. Patient also reports increased dizziness/unsteadiness standing and sitting with head rotations and head nods.       Treatment:   PT performed Halpike-Dix to test for posterior canal BPPV: negative  bilaterally  Seated VOR training with head rotations 5x 30 seconds. PT provided moderate verbal instruction for proper positioning and proper speed of movement. Patient responded well to instruction and reports increases dizziness after 5 seconds with each repetition.                      PT Education - 07/02/15 0815    Education provided Yes   Education Details Plan of care. HEP - see patient instructions    Person(s) Educated Patient   Methods Explanation;Demonstration;Tactile cues;Verbal cues   Comprehension Verbalized understanding;Returned demonstration;Verbal cues required;Tactile cues required             PT Long Term Goals - 07/02/15 0825    PT LONG TERM GOAL #1   Title Patient will be independent with HEP to improve balance, and increase strength to reduce fall risk by 07/29/15.   Time 4   Period Weeks   Status New   PT LONG TERM GOAL #2   Title Patient will improve ABC score to > 67% to indicate improved function and decreased fall risk by 07/29/15   Time 4   Period Weeks   Status New   PT LONG TERM GOAL #3   Title Patient will improve LE strength to 5/5 to allow increased ease of stair negotiation to improve access to the community by 07/29/15   Time 4   Period Weeks   Status New   PT LONG TERM GOAL #4   Title Patient will report no dizziness with head turns for 20 seconds to improve safety with driving by 09/60/45   Time 4   Period Weeks   Status New               Plan - 07/02/15 0816    Clinical Impression Statement Patient reports to PT for dizziness and falls. She also reports that she  has back pain that radiates down the L LE secondary to lumbar fractures and reconstructions. Patient demonstrates mildly reduced gait mechanic and speed on the 10 m walk test indicating mild decreased balance. Balance was also found to be reduced with decreased confidence on the ABC and decreased score on the DGI. Patient was also noted to have  increased dizziness with head turns and head nods in sitting and standing as well as increased unsteadiness standing on airex pad. LE strength was also found to be mildly impaired L LE>R LE. PT assesed posterior canal BPPV with Halpike-dix; negative bilaterally. Berg balance scale and 5x sit to stand were found indicate no increase in fall risk. Due to impairments found at PT evaluation, this patient would benefit from skilled PT to improve balance, imrpove gait, and increase strength to decrease fall risk within the community.    Pt will benefit from skilled therapeutic intervention in order to improve on the following deficits Abnormal gait;Decreased activity tolerance;Decreased balance;Decreased mobility;Decreased safety awareness;Decreased strength;Difficulty walking;Pain;Improper body mechanics;Postural dysfunction   Rehab Potential Good   Clinical Impairments Affecting Rehab Potential Positive: high PLOF. Negative: multiple falls, co-morbidities.    PT Frequency 2x / week   PT Duration 4 weeks   PT Treatment/Interventions ADLs/Self Care Home Management;Cryotherapy;Electrical Stimulation;Moist Heat;Gait training;Stair training;Functional mobility training;Therapeutic activities;Therapeutic exercise;Balance training;Neuromuscular re-education;Patient/family education;Manual techniques;Energy conservation;Vestibular   PT Next Visit Plan 6 minute walk test. VOR training.    PT Home Exercise Plan see patient instructions.    Consulted and Agree with Plan of Care Patient          G-Codes - 07-10-15 8182    Functional Assessment Tool Used Merrilee Jansky balance, 10 meter walk, DGI   Functional Limitation Mobility: Walking and moving around   Mobility: Walking and Moving Around Current Status [phone]) At least 20 percent but less than 40 percent impaired, limited or restricted   Mobility: Walking and Moving Around Goal Status [phone]) At least 1 percent but less than 20 percent impaired, limited or restricted        Problem List There are no active problems to display for this patient.  Barrie Folk SPT 07-10-2015   9:21 AM  This entire session was performed under direct supervision and direction of a licensed therapist/therapist assistant . I have personally read, edited and approve of the note as written.  Hopkins,Margaret PT, DPT Jul 10, 2015, 9:21 AM  [place] MAIN [facility] SERVICES [address] [place], [place], [address] Phone: [phone]   Fax:  [phone]  Name: Nicole Bass MRN: [id number] Date of Birth: April 18, 1951

## 2015-07-02 NOTE — Patient Instructions (Signed)
VOR training: hold card at arms length, turn head side to side while maintaining gaze on card for 20 seconds. Repeat 6 times.

## 2015-07-06 ENCOUNTER — Ambulatory Visit: Payer: Medicare Other | Admitting: Physical Therapy

## 2015-07-06 ENCOUNTER — Encounter: Payer: Self-pay | Admitting: Physical Therapy

## 2015-07-06 DIAGNOSIS — R269 Unspecified abnormalities of gait and mobility: Secondary | ICD-10-CM

## 2015-07-06 DIAGNOSIS — R296 Repeated falls: Secondary | ICD-10-CM | POA: Diagnosis not present

## 2015-07-06 DIAGNOSIS — R42 Dizziness and giddiness: Secondary | ICD-10-CM

## 2015-07-06 DIAGNOSIS — R29898 Other symptoms and signs involving the musculoskeletal system: Secondary | ICD-10-CM

## 2015-07-06 NOTE — Patient Instructions (Addendum)
  PT advanced HEP with VOR exercise instruction adding exercise where head stays still and arm moves. Provided written handout with pictures.

## 2015-07-06 NOTE — Therapy (Signed)
[place] MAIN [facility] SERVICES [address] [place], [place], [address] Phone: [phone]   Fax:  [phone]  Physical Therapy Treatment  Patient Details  Name: Nicole Bass MRN: [id number] Date of Birth: 29-Apr-1951 Referring Provider: Carloyn Manner  Encounter Date: 07/06/2015      PT End of Session - 07/06/15 1438    Visit Number 2   Number of Visits 9   Date for PT Re-Evaluation 07/29/15   Authorization Type gcode 2   Authorization Time Period 10   PT Start Time 1430   PT Stop Time 1515   PT Time Calculation (min) 45 min   Equipment Utilized During Treatment Gait belt   Activity Tolerance Patient limited by pain   Behavior During Therapy [facility] for tasks assessed/performed      Past Medical History  Diagnosis Date  . Arthritis   . Degenerative disc disease, cervical   . Crohn's disease ([place])   . Anemia     Past Surgical History  Procedure Laterality Date  . Back surgery    . Kyphoplasty    . Wrist fracture surgery Bilateral   . Abdominal hysterectomy    . Cesarean section  [place]  . Tonsillectomy    . Appendectomy    . Cholecystectomy    . Dilation and curettage of uterus  2004  . Elbow fracture surgery  2008  . Toe surgery  2013  . Joint replacement  2015    partial knee replacement  . Kyphoplasty N/A 04/14/2015    Procedure: KYPHOPLASTY;  Surgeon: Hessie Knows, MD;  Location: [facility] ORS;  Service: Orthopedics;  Laterality: N/A;  . Kyphoplasty N/A 06/04/2015    Procedure: KYPHOPLASTY L-5;  Surgeon: Hessie Knows, MD;  Location: [facility] ORS;  Service: Orthopedics;  Laterality: N/A;    There were no vitals filed for this visit.  Visit Diagnosis:  Repeated falls  Leg weakness, bilateral  Dizziness and giddiness  Abnormality of gait      Subjective Assessment - 07/06/15 1437    Subjective Patient reports increased back soreness today. She reports that her back hurts a little bit every day; She reports  compliance with VOR exercises and reports that they have been going well. She did reports an epiaode of dizziness while doing the exercises but not every time. She denies any new falls.    Pertinent History 3 falls within the past year resulting in 2 back surgeries and bilateral wrist surgeries for fx. History of BPPV     Limitations Standing;Walking;House hold activities;Lifting   How long can you stand comfortably? less than 5 minutes    How long can you walk comfortably? 15- 20 minutes    Patient Stated Goals Improve balance, decreae back pain,    Currently in Pain? Yes   Pain Score 5    Pain Location Back   Pain Orientation Posterior   Pain Descriptors / Indicators Aching   Pain Type Chronic pain   Pain Radiating Towards tailbone   Pain Onset More than a month ago   Pain Frequency Intermittent   Pain Relieving Factors rest/heat      Warm up on Nustep level 2 BUE/BLE x5 min concurrent with moist heat to low back x5 min (unbilled); Instructed patient in VOR exercise including up/down, side/side x2 min each with each VOR (head moves against stable object, object moves against stable head);  Standing on airex x2: Head turns side/side x10 each with cues for gaze stabilization; Heel/toe  raises x20 with 2-0 rail assist;  Tandem stance on upside down 1/2 bolster 10 sec hold with 1-0 rail assist x3 each foot in front; Forward stand on upside down 1/2 bolster with BUE arms overhead with cane lift x10 (min A) with min Vcs for gaze stabilization as cane moves for increased challenge; Forward stand on upside down 1/2 bolster with BUE ball pass, left/right x5 each direction;  Rockerboard, forward/backward, side/side x2 min each with 2-1 rail assist;  Re-educated patient in HEP with VOR exercise. Provided written handout with instruction for better compliance.                             PT Education - 07/06/15 1438    Education provided Yes   Education Details  exercise, balance   Person(s) Educated Patient   Methods Explanation;Verbal cues   Comprehension Verbalized understanding;Verbal cues required             PT Long Term Goals - 07/02/15 0825    PT LONG TERM GOAL #1   Title Patient will be independent with HEP to improve balance, and increase strength to reduce fall risk by 07/29/15.   Time 4   Period Weeks   Status New   PT LONG TERM GOAL #2   Title Patient will improve ABC score to > 67% to indicate improved function and decreased fall risk by 07/29/15   Time 4   Period Weeks   Status New   PT LONG TERM GOAL #3   Title Patient will improve LE strength to 5/5 to allow increased ease of stair negotiation to improve access to the community by 07/29/15   Time 4   Period Weeks   Status New   PT LONG TERM GOAL #4   Title Patient will report no dizziness with head turns for 20 seconds to improve safety with driving by 02/77/41   Time 4   Period Weeks   Status New               Plan - 07/06/15 1713    Clinical Impression Statement Patient instructed in balance exercise. She had increased difficulty with VOR horizontal exercise, especially when her head was still and her arm moved. Instructed patient in advanced exercise on uneven surfaces. Patient required CGA-min A for balance control. She denies any increase in dizziness but did exhibit increase instability with advanced exercise. Patient would benefit from additional skilled PT intervention to improve balance/gait safety and reduce fall risk.    Pt will benefit from skilled therapeutic intervention in order to improve on the following deficits Abnormal gait;Decreased activity tolerance;Decreased balance;Decreased mobility;Decreased safety awareness;Decreased strength;Difficulty walking;Pain;Improper body mechanics;Postural dysfunction   Rehab Potential Good   Clinical Impairments Affecting Rehab Potential Positive: high PLOF. Negative: multiple falls, co-morbidities.    PT  Frequency 2x / week   PT Duration 4 weeks   PT Treatment/Interventions ADLs/Self Care Home Management;Cryotherapy;Electrical Stimulation;Moist Heat;Gait training;Stair training;Functional mobility training;Therapeutic activities;Therapeutic exercise;Balance training;Neuromuscular re-education;Patient/family education;Manual techniques;Energy conservation;Vestibular   PT Next Visit Plan 6 minute walk test. VOR training.    PT Home Exercise Plan see patient instructions.    Consulted and Agree with Plan of Care Patient        Problem List There are no active problems to display for this patient.   Hopkins,Edwar Coe PT, DPT 07/07/2015, 8:39 AM  [place] MAIN [facility] SERVICES [address] [place], [place], [address] Phone:  [phone]   Fax:  [phone]  Name: Nicole Bass MRN: [id number] Date of Birth: 04/26/1951

## 2015-07-08 ENCOUNTER — Ambulatory Visit: Payer: Medicare Other | Admitting: Physical Therapy

## 2015-07-08 ENCOUNTER — Encounter: Payer: Self-pay | Admitting: Physical Therapy

## 2015-07-08 DIAGNOSIS — R296 Repeated falls: Secondary | ICD-10-CM

## 2015-07-08 DIAGNOSIS — R29898 Other symptoms and signs involving the musculoskeletal system: Secondary | ICD-10-CM

## 2015-07-08 DIAGNOSIS — R42 Dizziness and giddiness: Secondary | ICD-10-CM

## 2015-07-08 DIAGNOSIS — R269 Unspecified abnormalities of gait and mobility: Secondary | ICD-10-CM

## 2015-07-08 NOTE — Therapy (Signed)
[place] MAIN [facility] SERVICES [address] [place], [place], [address] Phone: [phone]   Fax:  [phone]  Physical Therapy Treatment  Patient Details  Name: Nicole Bass MRN: [id number] Date of Birth: 1951/07/17 Referring Provider: Carloyn Manner  Encounter Date: 07/08/2015      PT End of Session - 07/08/15 1534    Visit Number 3   Number of Visits 9   Date for PT Re-Evaluation 07/29/15   Authorization Type gcode 3   Authorization Time Period 10   PT Start Time 1430   PT Stop Time 1515   PT Time Calculation (min) 45 min   Equipment Utilized During Treatment Gait belt   Activity Tolerance Patient tolerated treatment well   Behavior During Therapy [facility] for tasks assessed/performed      Past Medical History  Diagnosis Date  . Arthritis   . Degenerative disc disease, cervical   . Crohn's disease ([place])   . Anemia     Past Surgical History  Procedure Laterality Date  . Back surgery    . Kyphoplasty    . Wrist fracture surgery Bilateral   . Abdominal hysterectomy    . Cesarean section  [place]  . Tonsillectomy    . Appendectomy    . Cholecystectomy    . Dilation and curettage of uterus  2004  . Elbow fracture surgery  2008  . Toe surgery  2013  . Joint replacement  2015    partial knee replacement  . Kyphoplasty N/A 04/14/2015    Procedure: KYPHOPLASTY;  Surgeon: Hessie Knows, MD;  Location: [facility] ORS;  Service: Orthopedics;  Laterality: N/A;  . Kyphoplasty N/A 06/04/2015    Procedure: KYPHOPLASTY L-5;  Surgeon: Hessie Knows, MD;  Location: [facility] ORS;  Service: Orthopedics;  Laterality: N/A;    There were no vitals filed for this visit.  Visit Diagnosis:  Repeated falls  Leg weakness, bilateral  Dizziness and giddiness  Abnormality of gait      Subjective Assessment - 07/08/15 1439    Subjective Patient reports taking a  pain pill today as her back was more sore from standing and cooking. She reports  sleeping well last night which was good. Patient reports having increased numbness/tingling in BUE which is intermittent and just started a week ago. PT recommended patient call MD and follow up regarding new symptoms.    Pertinent History 3 falls within the past year resulting in 2 back surgeries and bilateral wrist surgeries for fx. History of BPPV     Limitations Standing;Walking;House hold activities;Lifting   How long can you stand comfortably? less than 5 minutes    How long can you walk comfortably? 15- 20 minutes    Patient Stated Goals Improve balance, decreae back pain,    Currently in Pain? Yes   Pain Score 2    Pain Location Back   Pain Orientation Posterior   Pain Descriptors / Indicators Aching   Pain Type Chronic pain   Pain Radiating Towards tailbone   Pain Onset More than a month ago          TREATMENT: Warm up on Nustep level 2 BUE/BLE x5 min (Unbilled);  Tandem stance on airex balance beam without rail assist 10 sec hold x10 each foot in front; Side stepping on airex balance beam without rail assist x3 laps each direction; Standing with feet apart on airex balance beam, BUE ball bounce and catch x15;  Forward lunge on BOSU x10 each  LE with 1 rail assist;  Standing on BOSU: Heel/toe raises x15 with 2 rail assist; Standing feet together, 2-0 HHA, 30 sec hold, min A x2 reps;  Standing on firm surface: eyes open/closed 10 sec hold x3 each with feet close together;  Ladder drills: Forward reciprocal x4 laps; Out-out-in-in agility drill x2 lap with cues to increase speed for better challenge; Forward step with opposite LE march SLS hold 3 sec hold x2 laps each;  Patient reports increased numbness in RUE after exercise. PT assessed cervical spine (negative spurlings), patient also denies any increase in numbness with tinels sign. PT recommended patient follow up with MD regarding hand numbness. She has multiple co-morbidities which could be contributing to hand  numbness including past cervical surgery, arm surgery for fractures,etc;                         PT Education - 07/08/15 1534    Education provided Yes   Education Details balance exercise,    Person(s) Educated Patient   Methods Explanation;Verbal cues   Comprehension Verbalized understanding;Verbal cues required;Returned demonstration             PT Long Term Goals - 07/02/15 0825    PT LONG TERM GOAL #1   Title Patient will be independent with HEP to improve balance, and increase strength to reduce fall risk by 07/29/15.   Time 4   Period Weeks   Status New   PT LONG TERM GOAL #2   Title Patient will improve ABC score to > 67% to indicate improved function and decreased fall risk by 07/29/15   Time 4   Period Weeks   Status New   PT LONG TERM GOAL #3   Title Patient will improve LE strength to 5/5 to allow increased ease of stair negotiation to improve access to the community by 07/29/15   Time 4   Period Weeks   Status New   PT LONG TERM GOAL #4   Title Patient will report no dizziness with head turns for 20 seconds to improve safety with driving by 54/00/86   Time 4   Period Weeks   Status New               Plan - 07/08/15 1534    Clinical Impression Statement Instructed patient in advanced balance exercise utilizing uneven surfaces for increased challenge. Patient reports increased dizziness with head turns while on trampoline due to unstable surface and visual movement. Patient required min Vcs and CGA for balance control with advanced exercise. She would benefit from additional skilled PT intervention to improve balance/gait safety and reduce dizziness.   Pt will benefit from skilled therapeutic intervention in order to improve on the following deficits Abnormal gait;Decreased activity tolerance;Decreased balance;Decreased mobility;Decreased safety awareness;Decreased strength;Difficulty walking;Pain;Improper body mechanics;Postural  dysfunction   Rehab Potential Good   Clinical Impairments Affecting Rehab Potential Positive: high PLOF. Negative: multiple falls, co-morbidities.    PT Frequency 2x / week   PT Duration 4 weeks   PT Treatment/Interventions ADLs/Self Care Home Management;Cryotherapy;Electrical Stimulation;Moist Heat;Gait training;Stair training;Functional mobility training;Therapeutic activities;Therapeutic exercise;Balance training;Neuromuscular re-education;Patient/family education;Manual techniques;Energy conservation;Vestibular   PT Next Visit Plan 6 min walk test!!!  balance exercise   PT Home Exercise Plan continue as previously given   Consulted and Agree with Plan of Care Patient        Problem List There are no active problems to display for this patient.   Hopkins,Margaret PT, DPT 07/08/2015,  3:36 PM  [place] MAIN [facility] SERVICES [address] [place], [place], [address] Phone: [phone]   Fax:  [phone]  Name: Nicole Bass MRN: [id number] Date of Birth: 01/23/1951

## 2015-07-13 ENCOUNTER — Encounter: Payer: Self-pay | Admitting: Physical Therapy

## 2015-07-13 ENCOUNTER — Ambulatory Visit: Payer: Medicare Other | Admitting: Physical Therapy

## 2015-07-13 DIAGNOSIS — R296 Repeated falls: Secondary | ICD-10-CM

## 2015-07-13 DIAGNOSIS — R42 Dizziness and giddiness: Secondary | ICD-10-CM

## 2015-07-13 DIAGNOSIS — R269 Unspecified abnormalities of gait and mobility: Secondary | ICD-10-CM

## 2015-07-13 DIAGNOSIS — R29898 Other symptoms and signs involving the musculoskeletal system: Secondary | ICD-10-CM

## 2015-07-13 NOTE — Therapy (Signed)
[place] MAIN [facility] SERVICES [address] [place], [place], [address] Phone: [phone]   Fax:  [phone]  Physical Therapy Treatment  Patient Details  Name: Nicole Bass MRN: [id number] Date of Birth: 1951-04-09 Referring Provider: Carloyn Manner  Encounter Date: 07/13/2015      PT End of Session - 07/13/15 1450    Visit Number 4   Number of Visits 9   Date for PT Re-Evaluation 07/29/15   Authorization Type gcode 4   Authorization Time Period 10   PT Start Time 4128   PT Stop Time 1515   PT Time Calculation (min) 30 min   Equipment Utilized During Treatment Gait belt   Activity Tolerance Patient tolerated treatment well;No increased pain   Behavior During Therapy [facility] for tasks assessed/performed      Past Medical History  Diagnosis Date  . Arthritis   . Degenerative disc disease, cervical   . Crohn's disease ([place])   . Anemia     Past Surgical History  Procedure Laterality Date  . Back surgery    . Kyphoplasty    . Wrist fracture surgery Bilateral   . Abdominal hysterectomy    . Cesarean section  [place]  . Tonsillectomy    . Appendectomy    . Cholecystectomy    . Dilation and curettage of uterus  2004  . Elbow fracture surgery  2008  . Toe surgery  2013  . Joint replacement  2015    partial knee replacement  . Kyphoplasty N/A 04/14/2015    Procedure: KYPHOPLASTY;  Surgeon: Hessie Knows, MD;  Location: [facility] ORS;  Service: Orthopedics;  Laterality: N/A;  . Kyphoplasty N/A 06/04/2015    Procedure: KYPHOPLASTY L-5;  Surgeon: Hessie Knows, MD;  Location: [facility] ORS;  Service: Orthopedics;  Laterality: N/A;    There were no vitals filed for this visit.  Visit Diagnosis:  Repeated falls  Leg weakness, bilateral  Dizziness and giddiness  Abnormality of gait      Subjective Assessment - 07/13/15 1448    Subjective Patient reports having more soreness in left hip today. "I think that I am going to go see  the orthopedic doctor. My hip is just not getting any better." Patient reports that she has felt more off balanced this weekend.    Pertinent History 3 falls within the past year resulting in 2 back surgeries and bilateral wrist surgeries for fx. History of BPPV     Limitations Standing;Walking;House hold activities;Lifting   How long can you stand comfortably? less than 5 minutes    How long can you walk comfortably? 15- 20 minutes    Patient Stated Goals Improve balance, decreae back pain,    Currently in Pain? Yes   Pain Score 4    Pain Location Hip   Pain Orientation Left;Posterior   Pain Descriptors / Indicators Aching   Pain Type Chronic pain   Pain Onset More than a month ago   Pain Frequency Intermittent            [facility] PT Assessment - 07/13/15 0001    6 Minute Walk- Baseline   BP (mmHg) 126/62 mmHg   HR (bpm) 95   02 Sat (%RA) 97 %   6 Minute walk- Post Test   BP (mmHg) 130/67 mmHg   HR (bpm) 104   02 Sat (%RA) 99 %   Modified Borg Scale for Dyspnea 4- somewhat severe   Perceived Rate of Exertion (Borg) 15-  Hard   6 minute walk test results    Aerobic Endurance Distance Walked 985   Endurance additional comments without AD, had to rest at 4 min and 45 sec, due to fatigue and shortness of breath; <1000 feet is limited community ambulator       TREATMENT: Warm up on Nustep level 2 BUE/BLE x4 min (unbilled)  Instructed patient in 6 min walk test, please see attached outcome measure;  PT instructed patient in static and dynamic balance exercise: Modified tandem stance on dyna disc, weight shift side/side x10, head turns up/down, side/side x5 each without rail assist x1 set with each foot in front;  Standing on airex, step up to BOSU with 1 rail assist x5 each LE leading;  Patient required min VCs for balance stability, including to increase trunk control for less loss of balance with smaller base of support                        PT Education  - 07/13/15 1449    Education provided Yes   Education Details balance exercise, posture   Person(s) Educated Patient   Methods Explanation;Verbal cues   Comprehension Verbalized understanding;Verbal cues required;Returned demonstration             PT Long Term Goals - 07/02/15 0825    PT LONG TERM GOAL #1   Title Patient will be independent with HEP to improve balance, and increase strength to reduce fall risk by 07/29/15.   Time 4   Period Weeks   Status New   PT LONG TERM GOAL #2   Title Patient will improve ABC score to > 67% to indicate improved function and decreased fall risk by 07/29/15   Time 4   Period Weeks   Status New   PT LONG TERM GOAL #3   Title Patient will improve LE strength to 5/5 to allow increased ease of stair negotiation to improve access to the community by 07/29/15   Time 4   Period Weeks   Status New   PT LONG TERM GOAL #4   Title Patient will report no dizziness with head turns for 20 seconds to improve safety with driving by 77/41/42   Time 4   Period Weeks   Status New               Plan - 07/13/15 1450    Clinical Impression Statement Patient was late to treatment session; Instructed patient in 6 min walk test. She tested as a limited  community ambulator and had to stop after 4 min and 45 sec due to increased fatigue. Patient was instructed in balance exercise. She required min A for balance control on uneven surface and mod Vcs for better weight shift for improved stance control and balance. Patient would benefit from additional skilled PT intervention to improve balance/gait safety and reduce fall risk.    Pt will benefit from skilled therapeutic intervention in order to improve on the following deficits Abnormal gait;Decreased activity tolerance;Decreased balance;Decreased mobility;Decreased safety awareness;Decreased strength;Difficulty walking;Pain;Improper body mechanics;Postural dysfunction   Rehab Potential Good   Clinical  Impairments Affecting Rehab Potential Positive: high PLOF. Negative: multiple falls, co-morbidities.    PT Frequency 2x / week   PT Duration 4 weeks   PT Treatment/Interventions ADLs/Self Care Home Management;Cryotherapy;Electrical Stimulation;Moist Heat;Gait training;Stair training;Functional mobility training;Therapeutic activities;Therapeutic exercise;Balance training;Neuromuscular re-education;Patient/family education;Manual techniques;Energy conservation;Vestibular   PT Next Visit Plan 6 min walk test!!!  balance exercise   PT  Home Exercise Plan continue as previously given   Consulted and Agree with Plan of Care Patient        Problem List There are no active problems to display for this patient.   Hopkins,Margaret PT, DPT 07/13/2015, 4:21 PM  [place] MAIN [facility] SERVICES [address] [place], [place], [address] Phone: [phone]   Fax:  [phone]  Name: AMBROSIA WISNEWSKI MRN: [id number] Date of Birth: [birth year]

## 2015-07-15 ENCOUNTER — Ambulatory Visit: Payer: Medicare Other | Admitting: Physical Therapy

## 2015-07-20 ENCOUNTER — Ambulatory Visit: Payer: Medicare Other | Admitting: Physical Therapy

## 2015-07-20 ENCOUNTER — Encounter: Payer: Self-pay | Admitting: Physical Therapy

## 2015-07-20 DIAGNOSIS — R29898 Other symptoms and signs involving the musculoskeletal system: Secondary | ICD-10-CM

## 2015-07-20 DIAGNOSIS — R269 Unspecified abnormalities of gait and mobility: Secondary | ICD-10-CM

## 2015-07-20 DIAGNOSIS — R42 Dizziness and giddiness: Secondary | ICD-10-CM

## 2015-07-20 DIAGNOSIS — R296 Repeated falls: Secondary | ICD-10-CM

## 2015-07-20 NOTE — Therapy (Signed)
[place] MAIN [facility] SERVICES [address] [place], [place], [address] Phone: [phone]   Fax:  [phone]  July 20, 2015   @[email or web address] @  Physical Therapy Discharge Summary  Patient: Nicole Bass  MRN: [id number]  Date of Birth: [birth year]   Diagnosis:  Repeated falls  Leg weakness, bilateral  Dizziness and giddiness  Abnormality of gait Referring Provider: Carloyn Manner  The above patient had been seen in Physical Therapy 4 times of 9 treatments scheduled with 0 no shows and 3 cancellations.  The treatment consisted of balance exercise, VOR/vestibular exercise Patient called and cancelled her remaining appointments on 07/20/15 as her MD recommended that she follow up with Neurologist.   Discharge Findings: unable to obtain objective measures as patient cancelled last appointment      PT Long Term Goals - 07/02/15 0825    PT LONG TERM GOAL #1   Title Patient will be independent with HEP to improve balance, and increase strength to reduce fall risk by 07/29/15.   Time 4   Period Weeks   Status New   PT LONG TERM GOAL #2   Title Patient will improve ABC score to > 67% to indicate improved function and decreased fall risk by 07/29/15   Time 4   Period Weeks   Status New   PT LONG TERM GOAL #3   Title Patient will improve LE strength to 5/5 to allow increased ease of stair negotiation to improve access to the community by 07/29/15   Time 4   Period Weeks   Status New   PT LONG TERM GOAL #4   Title Patient will report no dizziness with head turns for 20 seconds to improve safety with driving by 28/76/81   Time 4   Period Weeks   Status New        Sincerely,   Shanel Prazak, PT DPT   CC @[email or web address] @  [place], [place], [address] Phone: [phone]   Fax:  [phone]  Patient: Nicole Bass  MRN: [id number]   Date of Birth: September 05, 1950

## 2015-07-22 ENCOUNTER — Ambulatory Visit: Payer: Medicare Other | Admitting: Physical Therapy

## 2015-07-27 ENCOUNTER — Ambulatory Visit: Payer: Medicare Other | Admitting: Physical Therapy

## 2015-07-29 ENCOUNTER — Ambulatory Visit: Payer: Medicare Other | Admitting: Physical Therapy

## 2015-08-04 ENCOUNTER — Emergency Department
Admission: EM | Admit: 2015-08-04 | Discharge: 2015-08-04 | Disposition: A | Discharge status: Home / Self Care | Payer: Medicare Other | Attending: Emergency Medicine | Admitting: Emergency Medicine

## 2015-08-04 ENCOUNTER — Encounter: Payer: Self-pay | Admitting: Emergency Medicine

## 2015-08-04 DIAGNOSIS — R55 Syncope and collapse: Secondary | ICD-10-CM

## 2015-08-04 DIAGNOSIS — Z79899 Other long term (current) drug therapy: Secondary | ICD-10-CM | POA: Insufficient documentation

## 2015-08-04 DIAGNOSIS — Z7952 Long term (current) use of systemic steroids: Secondary | ICD-10-CM | POA: Insufficient documentation

## 2015-08-04 DIAGNOSIS — I959 Hypotension, unspecified: Secondary | ICD-10-CM | POA: Diagnosis present

## 2015-08-04 DIAGNOSIS — Z7982 Long term (current) use of aspirin: Secondary | ICD-10-CM | POA: Diagnosis not present

## 2015-08-04 LAB — CBC
HEMATOCRIT: 37.2 % (ref 35.0–47.0)
Hemoglobin: 12.1 g/dL (ref 12.0–16.0)
MCH: 28.6 pg (ref 26.0–34.0)
MCHC: 32.4 g/dL (ref 32.0–36.0)
MCV: 88.4 fL (ref 80.0–100.0)
PLATELETS: 290 10*3/uL (ref 150–440)
RBC: 4.21 MIL/uL (ref 3.80–5.20)
RDW: 17.2 % — AB (ref 11.5–14.5)
WBC: 7.4 10*3/uL (ref 3.6–11.0)

## 2015-08-04 LAB — BASIC METABOLIC PANEL
Anion gap: 9 (ref 5–15)
BUN: 17 mg/dL (ref 6–20)
CO2: 26 mmol/L (ref 22–32)
Calcium: 9.1 mg/dL (ref 8.9–10.3)
Chloride: 104 mmol/L (ref 101–111)
Creatinine, Ser: 0.93 mg/dL (ref 0.44–1.00)
Glucose, Bld: 132 mg/dL — ABNORMAL HIGH (ref 65–99)
POTASSIUM: 3.5 mmol/L (ref 3.5–5.1)
SODIUM: 139 mmol/L (ref 135–145)

## 2015-08-04 LAB — TROPONIN I: Troponin I: <0.03 ng/mL (ref <0.031)

## 2015-08-04 MED ORDER — SODIUM CHLORIDE 0.9 % IV SOLN
Freq: Once | INTRAVENOUS | Status: AC
  Administered 2015-08-04: 17:00:00 via INTRAVENOUS

## 2015-08-04 NOTE — Discharge Instructions (Signed)
Please seek medical attention for any high fevers, chest pain, shortness of breath, change in behavior, persistent vomiting, bloody stool or any other new or concerning symptoms.  Syncope, commonly known as fainting, is a temporary loss of consciousness. It occurs when the blood flow to the brain is reduced. Vasovagal syncope (also called neurocardiogenic syncope) is a fainting spell in which the blood flow to the brain is reduced because of a sudden drop in heart rate and blood pressure. Vasovagal syncope occurs when the brain and the cardiovascular system (blood vessels) do not adequately communicate and respond to each other. This is the most common cause of fainting. It often occurs in response to fear or some other type of emotional or physical stress. The body has a reaction in which the heart starts beating too slowly or the blood vessels expand, reducing blood pressure. This type of fainting spell is generally considered harmless. However, injuries can occur if a person takes a sudden fall during a fainting spell.  CAUSES  Vasovagal syncope occurs when a person's blood pressure and heart rate decrease suddenly, usually in response to a trigger. Many things and situations can trigger an episode. Some of these include:   Pain.   Fear.   The sight of blood or medical procedures, such as blood being drawn from a vein.   Common activities, such as coughing, swallowing, stretching, or going to the bathroom.   Emotional stress.   Prolonged standing, especially in a warm environment.   Lack of sleep or rest.   Prolonged lack of food.   Prolonged lack of fluids.   Recent illness.  The use of certain drugs that affect blood pressure, such as cocaine, alcohol, marijuana, inhalants, and opiates.  SYMPTOMS  Before the fainting episode, you may:   Feel dizzy or light headed.   Become pale.  Sense that you are going to faint.   Feel like the room is spinning.   Have tunnel  vision, only seeing directly in front of you.   Feel sick to your stomach (nauseous).   See spots or slowly lose vision.   Hear ringing in your ears.   Have a headache.   Feel warm and sweaty.   Feel a sensation of pins and needles. During the fainting spell, you will generally be unconscious for no longer than a couple minutes before waking up and returning to normal. If you get up too quickly before your body can recover, you may faint again. Some twitching or jerky movements may occur during the fainting spell.  DIAGNOSIS  Your health care provider will ask about your symptoms, take a medical history, and perform a physical exam. Various tests may be done to rule out other causes of fainting. These may include blood tests and tests to check the heart, such as electrocardiography, echocardiography, and possibly an electrophysiology study. When other causes have been ruled out, a test may be done to check the body's response to changes in position (tilt table test). TREATMENT  Most cases of vasovagal syncope do not require treatment. Your health care provider may recommend ways to avoid fainting triggers and may provide home strategies for preventing fainting. If you must be exposed to a possible trigger, you can drink additional fluids to help reduce your chances of having an episode of vasovagal syncope. If you have warning signs of an oncoming episode, you can respond by positioning yourself favorably (lying down). If your fainting spells continue, you may be given medicines to prevent  fainting. Some medicines may help make you more resistant to repeated episodes of vasovagal syncope. Special exercises or compression stockings may be recommended. In rare cases, the surgical placement of a pacemaker is considered. HOME CARE INSTRUCTIONS   Learn to identify the warning signs of vasovagal syncope.   Sit or lie down at the first warning sign of a fainting spell. If sitting, put your  head down between your legs. If you lie down, swing your legs up in the air to increase blood flow to the brain.   Avoid hot tubs and saunas.  Avoid prolonged standing.  Drink enough fluids to keep your urine clear or pale yellow. Avoid caffeine.  Increase salt in your diet as directed by your health care provider.   If you have to stand for a long time, perform movements such as:   Crossing your legs.   Flexing and stretching your leg muscles.   Squatting.   Moving your legs.   Bending over.   Only take over-the-counter or prescription medicines as directed by your health care provider. Do not suddenly stop any medicines without asking your health care provider first. [place] IF:   Your fainting spells continue or happen more frequently in spite of treatment.   You lose consciousness for more than a couple minutes.  You have fainting spells during or after exercising or after being startled.   You have new symptoms that occur with the fainting spells, such as:   Shortness of breath.  Chest pain.   Irregular heartbeat.   You have episodes of twitching or jerky movements that last longer than a few seconds.  You have episodes of twitching or jerky movements without obvious fainting. SEEK IMMEDIATE MEDICAL CARE IF:   You have injuries or bleeding after a fainting spell.   You have episodes of twitching or jerky movements that last longer than 5 minutes.   You have more than one spell of twitching or jerky movements before returning to consciousness after fainting.   This information is not intended to replace advice given to you by your health care provider. Make sure you discuss any questions you have with your health care provider.   Document Released: 07/18/2012 Document Revised: 12/16/2014 Document Reviewed: 07/18/2012 Elsevier Interactive Patient Education [employer].

## 2015-08-04 NOTE — ED Notes (Signed)
Pt to ed from [facility].  Pt states she went there to have splinter removed from finger.  Pt reports during the procedure she began to feel faint and weak.  Pt reports they checked her blood pressure and found it to be low.  Pt alert and oriented at this time but reports she continues to feel faint.

## 2015-08-04 NOTE — ED Provider Notes (Signed)
[facility] Emergency Department Provider Note   ____________________________________________  Time seen: 1630  I have reviewed the triage vital signs and the nursing notes.   HISTORY  Chief Complaint Hypotension   History limited by: Not Limited   HPI Nicole Bass is a 64 y.o. female who presents to the emergency department today from [facility] clinic because of concerns for hypotension and likely vasovagal episode. Patient states she was sent to the acute care clinic today by Dr. Vira Agar because of a splinter that she got a couple of days ago in her finger. She states that during the procedure to remove the splint as she felt like she was in a lot of pain. Just after the procedure she began to feel lightheaded. The checked her blood pressure and found it to be quite low. They sent her here for further evaluation of the low blood pressure. By the time my examination the patient states she is feeling better. She denies any chest pain or shortness of breath during episode of low blood pressure. She denies any palpitations or skipped beats.  Past Medical History  Diagnosis Date  . Arthritis   . Degenerative disc disease, cervical   . Crohn's disease ([place])   . Anemia     There are no active problems to display for this patient.   Past Surgical History  Procedure Laterality Date  . Back surgery    . Kyphoplasty    . Wrist fracture surgery Bilateral   . Abdominal hysterectomy    . Cesarean section  [place]  . Tonsillectomy    . Appendectomy    . Cholecystectomy    . Dilation and curettage of uterus  2004  . Elbow fracture surgery  2008  . Toe surgery  2013  . Joint replacement  2015    partial knee replacement  . Kyphoplasty N/A 04/14/2015    Procedure: KYPHOPLASTY;  Surgeon: Hessie Knows, MD;  Location: [facility] ORS;  Service: Orthopedics;  Laterality: N/A;  . Kyphoplasty N/A 06/04/2015    Procedure: KYPHOPLASTY L-5;  Surgeon: Hessie Knows, MD;   Location: [facility] ORS;  Service: Orthopedics;  Laterality: N/A;    Current Outpatient Rx  Name  Route  Sig  Dispense  Refill  . Adalimumab (HUMIRA PEN-CROHNS STARTER) 40 MG/0.8ML PNKT   Subcutaneous   Inject 20 mg into the skin. Twice a month         . aspirin EC 81 MG tablet   Oral   Take 81 mg by mouth daily.         Marland Kitchen azelastine (ASTELIN) 0.1 % nasal spray   Each Nare   Place into both nostrils 2 (two) times daily. Use in each nostril as directed         . Biotin 1000 MCG tablet   Oral   Take 1,000 mcg by mouth daily.         . clonazePAM (KLONOPIN) 1 MG tablet   Oral   Take 1 mg by mouth at bedtime. As needed         . Cyanocobalamin (VITAMIN B-12 IJ)   Injection   Inject 1 Dose as directed every 30 (thirty) days.         . diphenoxylate-atropine (LOMOTIL) 2.5-0.025 MG per tablet   Oral   Take 1 tablet by mouth daily.         . folic acid (FOLVITE) 1 MG tablet   Oral   Take 1 mg by mouth  daily.         . furosemide (LASIX) 40 MG tablet   Oral   Take 20 mg by mouth daily.         Marland Kitchen HYDROcodone-acetaminophen (NORCO) 5-325 MG per tablet   Oral   Take 1 tablet by mouth every 6 (six) hours as needed for moderate pain.   30 tablet   0   . HYDROcodone-acetaminophen (NORCO) 5-325 MG tablet   Oral   Take 1 tablet by mouth every 6 (six) hours as needed for moderate pain. Patient not taking: Reported on 07/01/2015   30 tablet   0   . magnesium oxide (MAG-OX) 400 MG tablet   Oral   Take 400 mg by mouth 2 (two) times daily.         . methotrexate (50 MG/ML) 1 G injection   Subcutaneous   Inject 8 mg into the skin once a week. Thursday         . Multiple Vitamins-Minerals (MULTIVITAMIN WITH MINERALS) tablet   Oral   Take 1 tablet by mouth daily.         Marland Kitchen omeprazole (PRILOSEC) 40 MG capsule   Oral   Take 40 mg by mouth daily.         Marland Kitchen oxycodone (OXY-IR) 5 MG capsule   Oral   Take 5 mg by mouth every 4 (four) hours as needed.          . potassium chloride SA (K-DUR,KLOR-CON) 20 MEQ tablet   Oral   Take 20 mEq by mouth 2 (two) times daily.         . predniSONE (DELTASONE) 10 MG tablet   Oral   Take 10 mg by mouth daily with breakfast.         . rOPINIRole (REQUIP) 0.5 MG tablet   Oral   Take 0.5 mg by mouth at bedtime. As needed.           Allergies Primidone; Sulfa antibiotics; Cefazolin; Cephalosporins; Enbrel; Humira; Levaquin; Lorabid; Meropenem; and Ultram  History reviewed. No pertinent family history.  Social History Social History  Substance Use Topics  . Smoking status: Never Smoker   . Smokeless tobacco: None  . Alcohol Use: No    Review of Systems  Constitutional: Negative for fever. Cardiovascular: Negative for chest pain. Respiratory: Negative for shortness of breath. Gastrointestinal: Negative for abdominal pain, vomiting and diarrhea. Neurological: Negative for headaches, focal weakness or numbness.  10-point ROS otherwise negative.  ____________________________________________   PHYSICAL EXAM:  VITAL SIGNS: ED Triage Vitals  Enc Vitals Group     BP 08/04/15 1608 74/45 mmHg     Pulse Rate 08/04/15 1608 75     Resp 08/04/15 1608 20     Temp 08/04/15 1608 97.5 F (36.4 C)     Temp Source 08/04/15 1608 Oral     SpO2 08/04/15 1608 95 %     Weight 08/04/15 1608 185 lb (83.915 kg)     Height 08/04/15 1608 5' 3"  (1.6 m)     Head Cir --      Peak Flow --      Pain Score 08/04/15 1609 0   Constitutional: Alert and oriented. Well appearing and in no distress. Eyes: Conjunctivae are normal. PERRL. Normal extraocular movements. ENT   Head: Normocephalic and atraumatic.   Nose: No congestion/rhinnorhea.   Mouth/Throat: Mucous membranes are moist.   Neck: No stridor. Hematological/Lymphatic/Immunilogical: No cervical lymphadenopathy. Cardiovascular: Normal rate, regular rhythm.  No murmurs,  rubs, or gallops. Respiratory: Normal respiratory effort without  tachypnea nor retractions. Breath sounds are clear and equal bilaterally. No wheezes/rales/rhonchi. Gastrointestinal: Soft and nontender. No distention. There is no CVA tenderness. Genitourinary: Deferred Musculoskeletal: Normal range of motion in all extremities. No joint effusions.  No lower extremity tenderness nor edema. Neurologic:  Normal speech and language. No gross focal neurologic deficits are appreciated.  Skin:  Skin is warm, dry and intact. No rash noted. Psychiatric: Mood and affect are normal. Speech and behavior are normal. Patient exhibits appropriate insight and judgment.  ____________________________________________    LABS (pertinent positives/negatives)  Labs Reviewed  BASIC METABOLIC PANEL - Abnormal; Notable for the following:    Glucose, Bld 132 (*)    All other components within normal limits  CBC - Abnormal; Notable for the following:    RDW 17.2 (*)    All other components within normal limits  TROPONIN I     ____________________________________________   EKG  I, Nance Pear, attending physician, personally viewed and interpreted this EKG  EKG Time: 1634 Rate: 91 Rhythm: normal sinus rhythm Axis: normal Intervals: qtc 464 QRS: narrow ST changes: no st elevation Impression: normal ekg ____________________________________________    RADIOLOGY  None   ____________________________________________   PROCEDURES  Procedure(s) performed: None  Critical Care performed: No  ____________________________________________   INITIAL IMPRESSION / ASSESSMENT AND PLAN / ED COURSE  Pertinent labs & imaging results that were available during my care of the patient were reviewed by me and considered in my medical decision making (see chart for details).  Patient presented to the emergency department today from clinic clinic after an episode of hypotension. The patient likely had a vasovagal episode. She felt much better upon my initial exam  and continued to feel well throughout her stay here in the emergency department. Blood work without any concerning findings. Did discuss return precautions.  ____________________________________________   FINAL CLINICAL IMPRESSION(S) / ED DIAGNOSES  Final diagnoses:  Vasovagal episode     Nance Pear, MD 08/04/15 1740

## 2015-09-04 ENCOUNTER — Emergency Department: Payer: Medicare Other

## 2015-09-04 ENCOUNTER — Inpatient Hospital Stay
Admission: EM | Admit: 2015-09-04 | Discharge: 2015-09-07 | DRG: 387 | Disposition: A | Discharge status: Home / Self Care | Payer: Medicare Other | Attending: Internal Medicine | Admitting: Internal Medicine

## 2015-09-04 ENCOUNTER — Encounter: Payer: Self-pay | Admitting: Emergency Medicine

## 2015-09-04 DIAGNOSIS — Z888 Allergy status to other drugs, medicaments and biological substances status: Secondary | ICD-10-CM | POA: Diagnosis not present

## 2015-09-04 DIAGNOSIS — Z90710 Acquired absence of both cervix and uterus: Secondary | ICD-10-CM

## 2015-09-04 DIAGNOSIS — L270 Generalized skin eruption due to drugs and medicaments taken internally: Secondary | ICD-10-CM | POA: Diagnosis present

## 2015-09-04 DIAGNOSIS — E860 Dehydration: Secondary | ICD-10-CM | POA: Diagnosis present

## 2015-09-04 DIAGNOSIS — Z7982 Long term (current) use of aspirin: Secondary | ICD-10-CM

## 2015-09-04 DIAGNOSIS — Z882 Allergy status to sulfonamides status: Secondary | ICD-10-CM

## 2015-09-04 DIAGNOSIS — D638 Anemia in other chronic diseases classified elsewhere: Secondary | ICD-10-CM | POA: Diagnosis present

## 2015-09-04 DIAGNOSIS — Z96659 Presence of unspecified artificial knee joint: Secondary | ICD-10-CM | POA: Diagnosis present

## 2015-09-04 DIAGNOSIS — M5136 Other intervertebral disc degeneration, lumbar region: Secondary | ICD-10-CM | POA: Diagnosis present

## 2015-09-04 DIAGNOSIS — Z79899 Other long term (current) drug therapy: Secondary | ICD-10-CM

## 2015-09-04 DIAGNOSIS — Z9889 Other specified postprocedural states: Secondary | ICD-10-CM | POA: Diagnosis not present

## 2015-09-04 DIAGNOSIS — M5030 Other cervical disc degeneration, unspecified cervical region: Secondary | ICD-10-CM | POA: Diagnosis present

## 2015-09-04 DIAGNOSIS — M1990 Unspecified osteoarthritis, unspecified site: Secondary | ICD-10-CM | POA: Diagnosis present

## 2015-09-04 DIAGNOSIS — E876 Hypokalemia: Secondary | ICD-10-CM | POA: Diagnosis present

## 2015-09-04 DIAGNOSIS — D72819 Decreased white blood cell count, unspecified: Secondary | ICD-10-CM | POA: Diagnosis present

## 2015-09-04 DIAGNOSIS — Z9049 Acquired absence of other specified parts of digestive tract: Secondary | ICD-10-CM | POA: Diagnosis not present

## 2015-09-04 DIAGNOSIS — T380X5A Adverse effect of glucocorticoids and synthetic analogues, initial encounter: Secondary | ICD-10-CM | POA: Diagnosis present

## 2015-09-04 DIAGNOSIS — K5010 Crohn's disease of large intestine without complications: Secondary | ICD-10-CM | POA: Diagnosis present

## 2015-09-04 HISTORY — DX: Other cervical disc degeneration, unspecified cervical region: M50.30

## 2015-09-04 HISTORY — DX: Other intervertebral disc degeneration, lumbar region without mention of lumbar back pain or lower extremity pain: M51.369

## 2015-09-04 HISTORY — DX: Other intervertebral disc degeneration, lumbar region: M51.36

## 2015-09-04 HISTORY — DX: Systemic involvement of connective tissue, unspecified: M35.9

## 2015-09-04 LAB — URINALYSIS COMPLETE WITH MICROSCOPIC (ARMC ONLY)
BACTERIA UA: NONE SEEN
Bilirubin Urine: NEGATIVE
Glucose, UA: NEGATIVE mg/dL
Hgb urine dipstick: NEGATIVE
Ketones, ur: NEGATIVE mg/dL
Leukocytes, UA: NEGATIVE
Nitrite: NEGATIVE
PH: 6 (ref 5.0–8.0)
PROTEIN: 100 mg/dL — AB
RBC / HPF: NONE SEEN RBC/hpf (ref 0–5)
SQUAMOUS EPITHELIAL / LPF: NONE SEEN
Specific Gravity, Urine: 1.027 (ref 1.005–1.030)

## 2015-09-04 LAB — CBC
HEMATOCRIT: 38.9 % (ref 35.0–47.0)
HEMOGLOBIN: 12.9 g/dL (ref 12.0–16.0)
MCH: 29.2 pg (ref 26.0–34.0)
MCHC: 33.1 g/dL (ref 32.0–36.0)
MCV: 88.2 fL (ref 80.0–100.0)
Platelets: 241 10*3/uL (ref 150–440)
RBC: 4.41 MIL/uL (ref 3.80–5.20)
RDW: 15.5 % — ABNORMAL HIGH (ref 11.5–14.5)
WBC: 4.7 10*3/uL (ref 3.6–11.0)

## 2015-09-04 LAB — COMPREHENSIVE METABOLIC PANEL
ALBUMIN: 4 g/dL (ref 3.5–5.0)
ALK PHOS: 81 U/L (ref 38–126)
ALT: 52 U/L (ref 14–54)
ANION GAP: 12 (ref 5–15)
AST: 86 U/L — AB (ref 15–41)
BUN: 11 mg/dL (ref 6–20)
CALCIUM: 8.9 mg/dL (ref 8.9–10.3)
CO2: 25 mmol/L (ref 22–32)
Chloride: 103 mmol/L (ref 101–111)
Creatinine, Ser: 0.95 mg/dL (ref 0.44–1.00)
GFR calc Af Amer: >60 mL/min (ref >60)
GFR calc non Af Amer: >60 mL/min (ref >60)
GLUCOSE: 128 mg/dL — AB (ref 65–99)
POTASSIUM: 3.1 mmol/L — AB (ref 3.5–5.1)
SODIUM: 140 mmol/L (ref 135–145)
Total Bilirubin: 0.6 mg/dL (ref 0.3–1.2)
Total Protein: 7.7 g/dL (ref 6.5–8.1)

## 2015-09-04 LAB — LIPASE, BLOOD: Lipase: 46 U/L (ref 11–51)

## 2015-09-04 LAB — TROPONIN I

## 2015-09-04 LAB — SEDIMENTATION RATE: SED RATE: 61 mm/h — AB (ref 0–30)

## 2015-09-04 MED ORDER — ONDANSETRON HCL 4 MG/2ML IJ SOLN
4.0000 mg | Freq: Once | INTRAMUSCULAR | Status: AC
  Administered 2015-09-04: 4 mg via INTRAVENOUS
  Filled 2015-09-04: qty 2

## 2015-09-04 MED ORDER — METHYLPREDNISOLONE SODIUM SUCC 125 MG IJ SOLR
125.0000 mg | Freq: Once | INTRAMUSCULAR | Status: AC
  Administered 2015-09-04: 125 mg via INTRAVENOUS
  Filled 2015-09-04: qty 2

## 2015-09-04 MED ORDER — POTASSIUM CHLORIDE 10 MEQ/100ML IV SOLN
10.0000 meq | Freq: Once | INTRAVENOUS | Status: AC
  Administered 2015-09-04: 10 meq via INTRAVENOUS
  Filled 2015-09-04: qty 100

## 2015-09-04 MED ORDER — SUCRALFATE 1 G PO TABS
1.0000 g | ORAL_TABLET | Freq: Three times a day (TID) | ORAL | Status: DC | PRN
Start: 2015-09-04 — End: 2015-09-07
  Administered 2015-09-05 – 2015-09-06 (×2): 1 g via ORAL
  Filled 2015-09-04 (×2): qty 1

## 2015-09-04 MED ORDER — SODIUM CHLORIDE 0.9 % IV BOLUS (SEPSIS)
1000.0000 mL | Freq: Once | INTRAVENOUS | Status: AC
  Administered 2015-09-04: 1000 mL via INTRAVENOUS

## 2015-09-04 MED ORDER — POTASSIUM CHLORIDE 10 MEQ/100ML IV SOLN
10.0000 meq | INTRAVENOUS | Status: AC
  Administered 2015-09-04: 10 meq via INTRAVENOUS
  Filled 2015-09-04 (×2): qty 100

## 2015-09-04 MED ORDER — MORPHINE SULFATE (PF) 4 MG/ML IV SOLN
4.0000 mg | INTRAVENOUS | Status: AC
  Administered 2015-09-04: 4 mg via INTRAVENOUS
  Filled 2015-09-04: qty 1

## 2015-09-04 MED ORDER — MORPHINE SULFATE (PF) 2 MG/ML IV SOLN
2.0000 mg | INTRAVENOUS | Status: DC | PRN
  Administered 2015-09-05 (×2): 2 mg via INTRAVENOUS
  Filled 2015-09-04 (×2): qty 1

## 2015-09-04 MED ORDER — PANTOPRAZOLE SODIUM 40 MG PO TBEC: 40.0000 mg | DELAYED_RELEASE_TABLET | Freq: Every day | ORAL | Status: DC

## 2015-09-04 MED ORDER — ASPIRIN EC 81 MG PO TBEC
81.0000 mg | DELAYED_RELEASE_TABLET | Freq: Every day | ORAL | Status: DC
  Administered 2015-09-05 – 2015-09-07 (×3): 81 mg via ORAL
  Filled 2015-09-04 (×3): qty 1

## 2015-09-04 MED ORDER — METHYLPREDNISOLONE SODIUM SUCC 125 MG IJ SOLR: 60.0000 mg | INTRAMUSCULAR | Status: DC

## 2015-09-04 MED ORDER — POTASSIUM CHLORIDE IN NACL 20-0.9 MEQ/L-% IV SOLN
INTRAVENOUS | Status: AC
  Administered 2015-09-04 – 2015-09-05 (×3): via INTRAVENOUS
  Filled 2015-09-04 (×3): qty 1000

## 2015-09-04 MED ORDER — POTASSIUM CHLORIDE CRYS ER 20 MEQ PO TBCR
20.0000 meq | EXTENDED_RELEASE_TABLET | Freq: Every day | ORAL | Status: DC
  Administered 2015-09-05 – 2015-09-07 (×3): 20 meq via ORAL
  Filled 2015-09-04 (×3): qty 1

## 2015-09-04 MED ORDER — IOHEXOL 300 MG/ML  SOLN
100.0000 mL | Freq: Once | INTRAMUSCULAR | Status: AC | PRN
  Administered 2015-09-04: 100 mL via INTRAVENOUS

## 2015-09-04 MED ORDER — ACETAMINOPHEN 325 MG PO TABS: 650.0000 mg | ORAL_TABLET | Freq: Four times a day (QID) | ORAL | Status: DC | PRN

## 2015-09-04 MED ORDER — DIPHENOXYLATE-ATROPINE 2.5-0.025 MG PO TABS
1.0000 | ORAL_TABLET | Freq: Every day | ORAL | Status: DC
  Administered 2015-09-05 – 2015-09-07 (×3): 1 via ORAL
  Filled 2015-09-04 (×3): qty 1

## 2015-09-04 MED ORDER — FLUTICASONE PROPIONATE 50 MCG/ACT NA SUSP
1.0000 | Freq: Every day | NASAL | Status: DC
  Administered 2015-09-06 – 2015-09-07 (×2): 1 via NASAL
  Filled 2015-09-04: qty 16

## 2015-09-04 MED ORDER — ONDANSETRON HCL 4 MG PO TABS
4.0000 mg | ORAL_TABLET | Freq: Four times a day (QID) | ORAL | Status: DC | PRN
Start: 2015-09-04 — End: 2015-09-07

## 2015-09-04 MED ORDER — MAGNESIUM OXIDE 400 (241.3 MG) MG PO TABS
400.0000 mg | ORAL_TABLET | Freq: Every day | ORAL | Status: DC
  Administered 2015-09-05 – 2015-09-07 (×3): 400 mg via ORAL
  Filled 2015-09-04 (×3): qty 1

## 2015-09-04 MED ORDER — IOHEXOL 240 MG/ML SOLN
25.0000 mL | INTRAMUSCULAR | Status: DC
  Administered 2015-09-04: 25 mL via ORAL

## 2015-09-04 MED ORDER — ROPINIROLE HCL 0.5 MG PO TABS
0.5000 mg | ORAL_TABLET | Freq: Every evening | ORAL | Status: DC | PRN
  Administered 2015-09-06: 0.5 mg via ORAL
  Filled 2015-09-04 (×2): qty 1

## 2015-09-04 MED ORDER — LORATADINE 10 MG PO TABS
10.0000 mg | ORAL_TABLET | Freq: Every day | ORAL | Status: DC
  Administered 2015-09-05 – 2015-09-07 (×3): 10 mg via ORAL
  Filled 2015-09-04 (×3): qty 1

## 2015-09-04 MED ORDER — OXYCODONE HCL 5 MG PO TABS
5.0000 mg | ORAL_TABLET | ORAL | Status: DC | PRN
  Administered 2015-09-05 – 2015-09-07 (×6): 5 mg via ORAL
  Filled 2015-09-04 (×6): qty 1

## 2015-09-04 MED ORDER — ACETAMINOPHEN 650 MG RE SUPP: 650.0000 mg | Freq: Four times a day (QID) | RECTAL | Status: DC | PRN

## 2015-09-04 MED ORDER — METHYLPREDNISOLONE SODIUM SUCC 125 MG IJ SOLR
60.0000 mg | INTRAMUSCULAR | Status: DC
  Administered 2015-09-05: 60 mg via INTRAVENOUS
  Filled 2015-09-04: qty 2

## 2015-09-04 MED ORDER — MORPHINE SULFATE (PF) 4 MG/ML IV SOLN
4.0000 mg | Freq: Once | INTRAVENOUS | Status: AC
  Administered 2015-09-04: 4 mg via INTRAVENOUS
  Filled 2015-09-04: qty 1

## 2015-09-04 MED ORDER — MAGNESIUM SULFATE 2 GM/50ML IV SOLN
2.0000 g | Freq: Once | INTRAVENOUS | Status: AC
  Administered 2015-09-04: 2 g via INTRAVENOUS
  Filled 2015-09-04: qty 50

## 2015-09-04 MED ORDER — ONDANSETRON HCL 4 MG/2ML IJ SOLN: 4.0000 mg | Freq: Four times a day (QID) | INTRAMUSCULAR | Status: DC | PRN

## 2015-09-04 MED ORDER — ENOXAPARIN SODIUM 40 MG/0.4ML ~~LOC~~ SOLN
40.0000 mg | SUBCUTANEOUS | Status: DC
Start: 2015-09-04 — End: 2015-09-07
  Administered 2015-09-04 – 2015-09-06 (×3): 40 mg via SUBCUTANEOUS
  Filled 2015-09-04 (×4): qty 0.4

## 2015-09-04 MED ORDER — ADULT MULTIVITAMIN W/MINERALS CH
1.0000 | ORAL_TABLET | Freq: Every day | ORAL | Status: DC
  Administered 2015-09-05 – 2015-09-06 (×2): 1 via ORAL
  Filled 2015-09-04 (×2): qty 1

## 2015-09-04 MED ORDER — FOLIC ACID 1 MG PO TABS
1.0000 mg | ORAL_TABLET | Freq: Every day | ORAL | Status: DC
  Administered 2015-09-05 – 2015-09-07 (×3): 1 mg via ORAL
  Filled 2015-09-04 (×3): qty 1

## 2015-09-04 MED ORDER — CLONAZEPAM 1 MG PO TABS: 1.0000 mg | ORAL_TABLET | Freq: Every evening | ORAL | Status: DC | PRN

## 2015-09-04 MED ORDER — ALBUTEROL SULFATE (2.5 MG/3ML) 0.083% IN NEBU: 2.5000 mg | INHALATION_SOLUTION | RESPIRATORY_TRACT | Status: DC | PRN

## 2015-09-04 NOTE — ED Notes (Signed)
Second RN attempted x 2 to re establish IV access. Unsuccessful.

## 2015-09-04 NOTE — ED Notes (Signed)
Pt given ice chips at this time, tolerating well. No acute distress noted.

## 2015-09-04 NOTE — H&P (Signed)
[place] [facility] [place] NAME: Nicole Bass    MR#:  [id number]  DATE OF BIRTH:  Sep 29, 1950  DATE OF ADMISSION:  09/04/2015  PRIMARY CARE PHYSICIAN: [name], MD   REQUESTING/REFERRING PHYSICIAN: Dr. Mariea Clonts  CHIEF COMPLAINT:   Chief Complaint  Patient presents with  . Abdominal Pain    HISTORY OF PRESENT ILLNESS:  Nicole Bass  is a 65 y.o. female with a known history of Crohns here with diffuse abd pain and diarrhea since Sunday. No improvement. No blood. CT showed nothing acute. Afebrile. Discussed with Dr. Candace Cruise and advised admission for IV steroids. No recent antibiotic use. Patient is on methotrexate weekly. She did not tolerate Humira injections. Sees Dr. Vira Agar at [facility]. Has had complications of prednisone for her arthritis which was stopped. Weakness, falls, fractures.  PAST MEDICAL HISTORY:   Past Medical History  Diagnosis Date  . Arthritis   . Degenerative disc disease, cervical   . Crohn's disease ([place])   . Anemia   . DDD (degenerative disc disease), cervical   . DDD (degenerative disc disease), lumbar   . Collagen vascular disease ([place])     PAST SURGICAL HISTORY:   Past Surgical History  Procedure Laterality Date  . Back surgery    . Kyphoplasty    . Wrist fracture surgery Bilateral   . Abdominal hysterectomy    . Cesarean section  [place]  . Tonsillectomy    . Appendectomy    . Cholecystectomy    . Dilation and curettage of uterus  2004  . Elbow fracture surgery  2008  . Toe surgery  2013  . Joint replacement  2015    partial knee replacement  . Kyphoplasty N/A 04/14/2015    Procedure: KYPHOPLASTY;  Surgeon: Hessie Knows, MD;  Location: [facility] ORS;  Service: Orthopedics;  Laterality: N/A;  . Kyphoplasty N/A 06/04/2015    Procedure: KYPHOPLASTY L-5;  Surgeon: Hessie Knows, MD;  Location: [facility] ORS;  Service: Orthopedics;  Laterality: N/A;  . Medial partial knee replacement      SOCIAL  HISTORY:   Social History  Substance Use Topics  . Smoking status: Never Smoker   . Smokeless tobacco: Not on file  . Alcohol Use: No    FAMILY HISTORY:  History reviewed. No pertinent family history.  No crohns disease in family  DRUG ALLERGIES:   Allergies  Allergen Reactions  . Sulfa Antibiotics Other (See Comments)    Per pts MD she is unable to take this medication because it is contraindicated with her other medications.    . Cefazolin Itching and Rash  . Cephalosporins Itching and Rash  . Enbrel [Etanercept] Itching and Rash  . Humira [Adalimumab] Itching and Rash  . Levaquin [Levofloxacin In D5w] Itching and Rash  . Lorabid [Loracarbef] Itching and Rash  . Meropenem Itching and Rash  . Norco [Hydrocodone-Acetaminophen] Itching and Rash  . Primidone Itching and Rash  . Ultram [Tramadol Hcl] Itching and Rash    REVIEW OF SYSTEMS:   Review of Systems  Constitutional: Positive for malaise/fatigue. Negative for fever and chills.  HENT: Negative for sore throat.   Eyes: Negative for blurred vision, double vision and pain.  Respiratory: Negative for cough, hemoptysis, shortness of breath and wheezing.   Cardiovascular: Negative for chest pain, palpitations, orthopnea and leg swelling.  Gastrointestinal: Positive for abdominal pain and diarrhea. Negative for heartburn, nausea, vomiting and constipation.  Genitourinary: Negative for dysuria and hematuria.  Musculoskeletal: Negative for back pain and joint pain.  Skin: Negative for rash.  Neurological: Positive for weakness. Negative for sensory change, speech change, focal weakness and headaches.  Endo/Heme/Allergies: Does not bruise/bleed easily.  Psychiatric/Behavioral: Negative for depression and suicidal ideas. The patient is not nervous/anxious.     MEDICATIONS AT HOME:   Prior to Admission medications   Medication Sig Start Date End Date Taking? Authorizing Provider  aspirin EC 81 MG tablet Take 81 mg by  mouth daily.   Yes Historical Provider, MD  Biotin 1000 MCG tablet Take 1,000 mcg by mouth daily.   Yes Historical Provider, MD  cetirizine (ZYRTEC) 10 MG tablet Take 10 mg by mouth daily.   Yes Historical Provider, MD  Cholecalciferol (VITAMIN D3) 400 units CAPS Take 400 Units by mouth daily.   Yes Historical Provider, MD  clonazePAM (KLONOPIN) 1 MG tablet Take 1 mg by mouth at bedtime as needed (for sleep).    Yes Historical Provider, MD  cyanocobalamin (,VITAMIN B-12,) 1000 MCG/ML injection Inject 1,000 mcg into the muscle every 30 (thirty) days.   Yes Historical Provider, MD  Cyanocobalamin (VITAMIN B-12) 6000 MCG SUBL Place 6,000 mcg under the tongue daily.   Yes Historical Provider, MD  diphenoxylate-atropine (LOMOTIL) 2.5-0.025 MG per tablet Take 1 tablet by mouth daily.    Yes Historical Provider, MD  fluticasone (FLONASE) 50 MCG/ACT nasal spray Place 1 spray into both nostrils daily.   Yes Historical Provider, MD  folic acid (FOLVITE) 1 MG tablet Take 1 mg by mouth daily.   Yes Historical Provider, MD  furosemide (LASIX) 20 MG tablet Take 20 mg by mouth daily.   Yes Historical Provider, MD  magnesium oxide (MAG-OX) 400 MG tablet Take 400 mg by mouth daily.    Yes Historical Provider, MD  methotrexate 50 MG/2ML injection Inject 20 mg into the vein once a week. Pt uses on Friday.   Yes Historical Provider, MD  Multiple Vitamins-Minerals (MULTIVITAMIN WITH MINERALS) tablet Take 1 tablet by mouth daily.   Yes Historical Provider, MD  omeprazole (PRILOSEC) 40 MG capsule Take 40 mg by mouth daily.   Yes Historical Provider, MD  potassium chloride SA (K-DUR,KLOR-CON) 20 MEQ tablet Take 20 mEq by mouth daily.    Yes Historical Provider, MD  rOPINIRole (REQUIP) 0.5 MG tablet Take 0.5 mg by mouth at bedtime as needed (for restless legs).    Yes Historical Provider, MD  sucralfate (CARAFATE) 1 g tablet Take 1 g by mouth 3 (three) times daily with meals as needed (for stomach cramping).   Yes  Historical Provider, MD      VITAL SIGNS:  Blood pressure 114/64, pulse 89, temperature 98.7 F (37.1 C), temperature source Oral, resp. rate 17, height 5' 3"  (1.6 m), weight 85.73 kg (189 lb), SpO2 93 %.  PHYSICAL EXAMINATION:  Physical Exam  GENERAL:  65 y.o.-year-old patient lying in the bed with no acute distress.  EYES: Pupils equal, round, reactive to light and accommodation. No scleral icterus. Extraocular muscles intact.  HEENT: Head atraumatic, normocephalic. Oropharynx and nasopharynx clear. No oropharyngeal erythema, dry oral mucosa  NECK:  Supple, no jugular venous distention. No thyroid enlargement, no tenderness.  LUNGS: Normal breath sounds bilaterally, no wheezing, rales, rhonchi. No use of accessory muscles of respiration.  CARDIOVASCULAR: S1, S2 normal. No murmurs, rubs, or gallops.  ABDOMEN: Soft, diffuse abdominal tendernessded. Bowel sounds present. No organomegaly or mass.  EXTREMITIES: No pedal edema, cyanosis, or clubbing. + 2 pedal & radial pulses b/l.  NEUROLOGIC: Cranial nerves II through XII are intact. No focal Motor or sensory deficits appreciated b/l PSYCHIATRIC: The patient is alert and oriented x 3. Good affect.  SKIN: No obvious rash, lesion, or ulcer.   LABORATORY PANEL:   CBC  Recent Labs Lab 09/04/15 1149  WBC 4.7  HGB 12.9  HCT 38.9  PLT 241   ------------------------------------------------------------------------------------------------------------------  Chemistries   Recent Labs Lab 09/04/15 1149  NA 140  K 3.1*  CL 103  CO2 25  GLUCOSE 128*  BUN 11  CREATININE 0.95  CALCIUM 8.9  AST 86*  ALT 52  ALKPHOS 81  BILITOT 0.6   ------------------------------------------------------------------------------------------------------------------  Cardiac Enzymes  Recent Labs Lab 09/04/15 1149  TROPONINI <0.03    ------------------------------------------------------------------------------------------------------------------  RADIOLOGY:  Ct Abdomen Pelvis W Contrast  09/04/2015  CLINICAL DATA:  Nausea and vomiting and generalized pain for 1 week, history of Crohn's disease EXAM: CT ABDOMEN AND PELVIS WITH CONTRAST TECHNIQUE: Multidetector CT imaging of the abdomen and pelvis was performed using the standard protocol following bolus administration of intravenous contrast. CONTRAST:  198m OMNIPAQUE IOHEXOL 300 MG/ML  SOLN COMPARISON:  10/19/2013 FINDINGS: Lung bases are free of acute infiltrate or sizable effusion. Mild scarring is noted. The liver is diffusely fatty infiltrated without focal mass. The gallbladder has been surgically removed. The spleen, adrenal glands and pancreas are all normal in their CT appearance. The kidneys are well visualized bilaterally and demonstrate some nonobstructing renal stones on the right. The largest of these measures approximately 4 mm in greatest dimension. The ureters are within normal limits. No ureteral calculi are seen. The bladder is partially distended. No pelvic mass lesion is noted. There is persistent stranding in the central mesentery similar to that seen on the prior exam. No significant lymphadenopathy is noted. No bony abnormality is seen. Changes of prior multilevel vertebral augmentation are noted. The appendix has been surgically removed. IMPRESSION: Stable right renal calculus. Stable mesenteric stranding centrally when compared with the prior exam. Electronically Signed   By: MInez CatalinaM.D.   On: 09/04/2015 18:34     IMPRESSION AND PLAN:   * Crohns disease flare With abdominal pain and severe diarrhea causing dehydration. No blood. We'll start IV Solu-Medrol. Consult GI. CT scan doesn't show any significant abnormalities. Check C Diff  * Dehydration with hypokalemia we'll start IV fluids with potassium. Also replace potassium.   * DVT prophylaxis  with Lovenox     All the records are reviewed and case discussed with ED provider. Management plans discussed with the patient, family and they are in agreement.  CODE STATUS: FULL  TOTAL TIME TAKING CARE OF THIS PATIENT: 40 minutes.    SHillary BowR M.D on 09/04/2015 at 8:00 PM  Between 7am to 6pm - Pager - [phone]  After 6pm go to [email or web address] - password [email or web address]  Office  [phone] CC: Primary care physician; SIdelle Crouch MD     Note: This dictation was prepared with Dragon dictation along with smaller phrase technology. Any transcriptional errors that result from this process are unintentional.

## 2015-09-04 NOTE — ED Notes (Signed)
Third RN attempting US guided IV access at this time.

## 2015-09-04 NOTE — ED Notes (Signed)
C/o upper and generalized abdominal pain.  Has been feeling weak and having diarrhea.  Reports it does feel like a chrons flare.  Nausea but no vomiting.

## 2015-09-04 NOTE — ED Provider Notes (Signed)
[facility] Emergency Department Provider Note  ____________________________________________  Time seen: Approximately 5:06 PM  I have reviewed the triage vital signs and the nursing notes.   HISTORY  Chief Complaint Abdominal Pain    HPI Nicole Bass is a 65 y.o. female history of Crohn's disease, status post cholecystectomy and appendectomy.  Patient presents for evaluation of 3 days of crampy abdominal pain associated loose stools. She reports this feels similar to previous Crohn's flares and she is been unable to keep food down well except for a very small sandwich last night.  Denies fevers or chills, does state up about 3-4 loose watery stools daily. Denies chest pain or trouble breathing. Reports same symptoms previously with Crohn's flares. She did recently stop methotrexate for the last 2 weeks is a recent sinus infection. She has not tolerated Humira in the past.   Past Medical History  Diagnosis Date  . Arthritis   . Degenerative disc disease, cervical   . Crohn's disease ([place])   . Anemia   . DDD (degenerative disc disease), cervical   . DDD (degenerative disc disease), lumbar     There are no active problems to display for this patient.   Past Surgical History  Procedure Laterality Date  . Back surgery    . Kyphoplasty    . Wrist fracture surgery Bilateral   . Abdominal hysterectomy    . Cesarean section  [place]  . Tonsillectomy    . Appendectomy    . Cholecystectomy    . Dilation and curettage of uterus  2004  . Elbow fracture surgery  2008  . Toe surgery  2013  . Joint replacement  2015    partial knee replacement  . Kyphoplasty N/A 04/14/2015    Procedure: KYPHOPLASTY;  Surgeon: Hessie Knows, MD;  Location: [facility] ORS;  Service: Orthopedics;  Laterality: N/A;  . Kyphoplasty N/A 06/04/2015    Procedure: KYPHOPLASTY L-5;  Surgeon: Hessie Knows, MD;  Location: [facility] ORS;  Service: Orthopedics;  Laterality: N/A;  . Medial  partial knee replacement      Current Outpatient Rx  Name  Route  Sig  Dispense  Refill  . aspirin EC 81 MG tablet   Oral   Take 81 mg by mouth daily.         . Biotin 1000 MCG tablet   Oral   Take 1,000 mcg by mouth daily.         . cetirizine (ZYRTEC) 10 MG tablet   Oral   Take 10 mg by mouth daily.         . Cholecalciferol (VITAMIN D3) 400 units CAPS   Oral   Take 400 Units by mouth daily.         . clonazePAM (KLONOPIN) 1 MG tablet   Oral   Take 1 mg by mouth at bedtime as needed (for sleep).          . cyanocobalamin (,VITAMIN B-12,) 1000 MCG/ML injection   Intramuscular   Inject 1,000 mcg into the muscle every 30 (thirty) days.         . Cyanocobalamin (VITAMIN B-12) 6000 MCG SUBL   Sublingual   Place 6,000 mcg under the tongue daily.         . diphenoxylate-atropine (LOMOTIL) 2.5-0.025 MG per tablet   Oral   Take 1 tablet by mouth daily.          . fluticasone (FLONASE) 50 MCG/ACT nasal spray   Each Nare  Place 1 spray into both nostrils daily.         . folic acid (FOLVITE) 1 MG tablet   Oral   Take 1 mg by mouth daily.         . furosemide (LASIX) 20 MG tablet   Oral   Take 20 mg by mouth daily.         . magnesium oxide (MAG-OX) 400 MG tablet   Oral   Take 400 mg by mouth daily.          . methotrexate 50 MG/2ML injection   Intravenous   Inject 20 mg into the vein once a week. Pt uses on Friday.         . Multiple Vitamins-Minerals (MULTIVITAMIN WITH MINERALS) tablet   Oral   Take 1 tablet by mouth daily.         Marland Kitchen omeprazole (PRILOSEC) 40 MG capsule   Oral   Take 40 mg by mouth daily.         . potassium chloride SA (K-DUR,KLOR-CON) 20 MEQ tablet   Oral   Take 20 mEq by mouth daily.          Marland Kitchen rOPINIRole (REQUIP) 0.5 MG tablet   Oral   Take 0.5 mg by mouth at bedtime as needed (for restless legs).          . sucralfate (CARAFATE) 1 g tablet   Oral   Take 1 g by mouth 3 (three) times daily with  meals as needed (for stomach cramping).           Allergies Sulfa antibiotics; Cefazolin; Cephalosporins; Enbrel; Humira; Levaquin; Lorabid; Meropenem; Norco; Primidone; and Ultram  History reviewed. No pertinent family history.  Social History Social History  Substance Use Topics  . Smoking status: Never Smoker   . Smokeless tobacco: None  . Alcohol Use: No    Review of Systems Constitutional: No fever/chills Eyes: No visual changes. ENT: No sore throat. Cardiovascular: Denies chest pain. Respiratory: Denies shortness of breath. Gastrointestinal: No abdominal pain.  No nausea, no vomiting.  No diarrhea.  No constipation. Genitourinary: Negative for dysuria. Musculoskeletal: Negative for back pain. Skin: Negative for rash. Neurological: Negative for headaches, focal weakness or numbness. Patient does report feeling generally weak over the last few days.  10-point ROS otherwise negative.  ____________________________________________   PHYSICAL EXAM:  VITAL SIGNS: ED Triage Vitals  Enc Vitals Group     BP 09/04/15 1146 102/75 mmHg     Pulse Rate 09/04/15 1146 107     Resp 09/04/15 1146 18     Temp 09/04/15 1146 98.7 F (37.1 C)     Temp Source 09/04/15 1146 Oral     SpO2 09/04/15 1146 95 %     Weight 09/04/15 1146 189 lb (85.73 kg)     Height 09/04/15 1146 5' 3"  (1.6 m)     Head Cir --      Peak Flow --      Pain Score 09/04/15 1146 4     Pain Loc --      Pain Edu? --      Excl. in [place]? --    Constitutional: Alert and oriented. Well appearing and in no acute distress. Eyes: Conjunctivae are normal. PERRL. EOMI. Head: Atraumatic. Nose: No congestion/rhinnorhea. Mouth/Throat: Mucous membranes are moist.  Oropharynx non-erythematous. Neck: No stridor.   Cardiovascular: Normal rate, regular rhythm. Grossly normal heart sounds.  Good peripheral circulation. Respiratory: Normal respiratory effort.  No retractions. Lungs CTAB.  Gastrointestinal: Soft but moderate  tenderness throughout without clear focality. No rebound or guarding to suggest acute peritonitis.. No distention. No abdominal bruits. No CVA tenderness. Musculoskeletal: No lower extremity tenderness nor edema.  No joint effusions. Neurologic:  Normal speech and language. No gross focal neurologic deficits are appreciated. Generalized weakness in all extremities. No focal abnormality.  Skin:  Skin is warm, dry and intact. No rash noted. Psychiatric: Mood and affect are normal. Speech and behavior are normal.  ____________________________________________   LABS (all labs ordered are listed, but only abnormal results are displayed)  Labs Reviewed  COMPREHENSIVE METABOLIC PANEL - Abnormal; Notable for the following:    Potassium 3.1 (*)    Glucose, Bld 128 (*)    AST 86 (*)    All other components within normal limits  CBC - Abnormal; Notable for the following:    RDW 15.5 (*)    All other components within normal limits  URINALYSIS COMPLETEWITH MICROSCOPIC ([facility] ONLY) - Abnormal; Notable for the following:    Color, Urine YELLOW (*)    APPearance TURBID (*)    Protein, ur 100 (*)    All other components within normal limits  SEDIMENTATION RATE - Abnormal; Notable for the following:    Sed Rate 61 (*)    All other components within normal limits  LIPASE, BLOOD  TROPONIN I  C-REACTIVE PROTEIN   ____________________________________________  EKG  Reviewed and interpreted me at noon Sinus tachycardia, ventricular rate 110 QRS 85 QTc 490 Probable right ventricular hypertrophy, minimal T-wave abnormality noted in V2 and V3 without clear ischemic abnormality. No ST elevation ____________________________________________  RADIOLOGY  CT abdomen and pelvis pending and assigned to Dr. Reita Cliche at the time signout ____________________________________________   PROCEDURES  Procedure(s) performed: None  Critical Care performed:  No  ____________________________________________   INITIAL IMPRESSION / ASSESSMENT AND PLAN / ED COURSE  Pertinent labs & imaging results that were available during my care of the patient were reviewed by me and considered in my medical decision making (see chart for details).  Patient presents with crampy lower abdominal pain loose stools and diarrhea with a history of Crohn's. I will suspect Crohn's flare but given the patient's severity of pain symptoms in the abdominal exam we'll obtain CT imaging to evaluate for fistulization, abscess, diverticulitis or other alternative etiology to explain her diarrhea and abdominal pain.  The patient's potassium to be 3.1, the setting of multiple loose stools this likely due to GI losses. We'll obtain IV potassium, magnesium, and anticipated admission to hospital for pain control and treatment ongoing probable Crohn's flare however CT abdomen pending at the time of sign of Dr. Reita Cliche. ____________________________________________   FINAL CLINICAL IMPRESSION(S) / ED DIAGNOSES  Final diagnoses:  None      Delman Kitten, MD 09/04/15 1710

## 2015-09-04 NOTE — ED Notes (Signed)
Pt finished drinking contrast, CT made aware, will come and pick pt up momentarily.

## 2015-09-04 NOTE — ED Provider Notes (Signed)
[facility]  I accepted care from Dr. Jacqualine Code ____________________________________________    LABS (pertinent positives/negatives)  Significant for potassium 3.1. And sedimentation rate 61   ____________________________________________    RADIOLOGY All xrays were viewed by me. Imaging interpreted by radiologist.  CT abdomen and pelvis with contrast:  INDINGS: Lung bases are free of acute infiltrate or sizable effusion. Mild scarring is noted.  The liver is diffusely fatty infiltrated without focal mass. The gallbladder has been surgically removed. The spleen, adrenal glands and pancreas are all normal in their CT appearance.  The kidneys are well visualized bilaterally and demonstrate some nonobstructing renal stones on the right. The largest of these measures approximately 4 mm in greatest dimension. The ureters are within normal limits. No ureteral calculi are seen. The bladder is partially distended. No pelvic mass lesion is noted.  There is persistent stranding in the central mesentery similar to that seen on the prior exam. No significant lymphadenopathy is noted. No bony abnormality is seen. Changes of prior multilevel vertebral augmentation are noted. The appendix has been surgically removed.  IMPRESSION: Stable right renal calculus.  Stable mesenteric stranding centrally when compared with the prior exam.  ____________________________________________   PROCEDURES  Procedure(s) performed: None  Critical Care performed: None  ____________________________________________   INITIAL IMPRESSION / ASSESSMENT AND PLAN / ED COURSE   Pertinent labs & imaging results that were available during my care of the patient were reviewed by me and considered in my medical decision making (see chart for details).  I accepted care at shift change, pending CT scan. Patient remains symptomatic with persistent pain, patient was given a second dose of  pain medication. I discussed the case with the on-call GI, Dr. Candace Cruise, recommends IV steroids over the weekend, liquid diet for 24 hours and admission to the hospitalist.  CONSULTATIONS: Dr. Candace Cruise, Gastroenterology.  Hospitalist for admission    Patient / Family / Caregiver informed of clinical course, medical decision-making process, and agree with plan.   ____________________________________________   FINAL CLINICAL IMPRESSION(S) / ED DIAGNOSES  Final diagnoses:  Crohn's colitis, without complications ([place])        Lisa Roca, MD 09/04/15 1900

## 2015-09-04 NOTE — ED Notes (Signed)
Pharmacy called regarding potassium will send to ED.

## 2015-09-04 NOTE — ED Notes (Addendum)
Pt states IV is "hurting way too much I cant take it, take it out now"  IV intact, no infiltration noted, fluids running well.  Will re attempt second IV. Fluids will be continued once IV restarted. IV pulled at this time per pt request. Will restart med once another IV is established.

## 2015-09-05 LAB — BASIC METABOLIC PANEL
ANION GAP: 7 (ref 5–15)
BUN: 9 mg/dL (ref 6–20)
CALCIUM: 7.4 mg/dL — AB (ref 8.9–10.3)
CHLORIDE: 109 mmol/L (ref 101–111)
CO2: 22 mmol/L (ref 22–32)
Creatinine, Ser: 0.76 mg/dL (ref 0.44–1.00)
GFR calc non Af Amer: >60 mL/min (ref >60)
GLUCOSE: 156 mg/dL — AB (ref 65–99)
POTASSIUM: 3.5 mmol/L (ref 3.5–5.1)
Sodium: 138 mmol/L (ref 135–145)

## 2015-09-05 LAB — CBC
HEMATOCRIT: 31.8 % — AB (ref 35.0–47.0)
HEMOGLOBIN: 10.6 g/dL — AB (ref 12.0–16.0)
MCH: 29.4 pg (ref 26.0–34.0)
MCHC: 33.4 g/dL (ref 32.0–36.0)
MCV: 88.1 fL (ref 80.0–100.0)
Platelets: 180 10*3/uL (ref 150–440)
RBC: 3.61 MIL/uL — AB (ref 3.80–5.20)
RDW: 15.2 % — ABNORMAL HIGH (ref 11.5–14.5)
WBC: 1.6 10*3/uL — ABNORMAL LOW (ref 3.6–11.0)

## 2015-09-05 LAB — C DIFFICILE QUICK SCREEN W PCR REFLEX
C DIFFICILE (CDIFF) INTERP: NEGATIVE
C DIFFICILE (CDIFF) TOXIN: NEGATIVE
C Diff antigen: NEGATIVE

## 2015-09-05 LAB — C-REACTIVE PROTEIN

## 2015-09-05 MED ORDER — PANTOPRAZOLE SODIUM 40 MG PO TBEC
40.0000 mg | DELAYED_RELEASE_TABLET | Freq: Every day | ORAL | Status: DC
  Administered 2015-09-05 – 2015-09-07 (×3): 40 mg via ORAL
  Filled 2015-09-05 (×4): qty 1

## 2015-09-05 MED ORDER — DIPHENHYDRAMINE HCL 25 MG PO CAPS
25.0000 mg | ORAL_CAPSULE | Freq: Three times a day (TID) | ORAL | Status: DC | PRN
  Administered 2015-09-05: 25 mg via ORAL
  Filled 2015-09-05: qty 1

## 2015-09-05 NOTE — Progress Notes (Signed)
[place] [facility] [place] NAME: Nicole Bass    MR#:  [id number]  DATE OF BIRTH:  Jun 23, 1951  SUBJECTIVE:   Patient is continuing to have diarrhea. No blood in her stools. She is hungry and would like to try a soft diet.  REVIEW OF SYSTEMS:    Review of Systems  Constitutional: Negative for fever, chills and malaise/fatigue.  HENT: Negative for sore throat.   Eyes: Negative for blurred vision.  Respiratory: Negative for cough, hemoptysis, shortness of breath and wheezing.   Cardiovascular: Negative for chest pain, palpitations and leg swelling.  Gastrointestinal: Positive for abdominal pain and diarrhea. Negative for nausea, vomiting and blood in stool.  Genitourinary: Negative for dysuria.  Musculoskeletal: Negative for back pain.  Neurological: Negative for dizziness, tremors and headaches.  Endo/Heme/Allergies: Does not bruise/bleed easily.    Tolerating Diet: Yes      DRUG ALLERGIES:   Allergies  Allergen Reactions  . Sulfa Antibiotics Other (See Comments)    Per pts MD she is unable to take this medication because it is contraindicated with her other medications.    . Cefazolin Itching and Rash  . Cephalosporins Itching and Rash  . Enbrel [Etanercept] Itching and Rash  . Humira [Adalimumab] Itching and Rash  . Levaquin [Levofloxacin In D5w] Itching and Rash  . Lorabid [Loracarbef] Itching and Rash  . Meropenem Itching and Rash  . Norco [Hydrocodone-Acetaminophen] Itching and Rash  . Primidone Itching and Rash  . Ultram [Tramadol Hcl] Itching and Rash    VITALS:  Blood pressure 114/63, pulse 84, temperature 98.2 F (36.8 C), temperature source Oral, resp. rate 20, height 5' 3"  (1.6 m), weight 84.823 kg (187 lb), SpO2 94 %.  PHYSICAL EXAMINATION:   Physical Exam  Constitutional: She is oriented to person, place, and time and well-developed, well-nourished, and in no distress. No distress.  HENT:  Head:  Normocephalic.  Eyes: No scleral icterus.  Neck: Normal range of motion. Neck supple. No JVD present. No tracheal deviation present.  Cardiovascular: Normal rate, regular rhythm and normal heart sounds.  Exam reveals no gallop and no friction rub.   No murmur heard. Pulmonary/Chest: Effort normal and breath sounds normal. No respiratory distress. She has no wheezes. She has no rales. She exhibits no tenderness.  Abdominal: Soft. Bowel sounds are normal. She exhibits no distension and no mass. There is no tenderness. There is no rebound and no guarding.  Musculoskeletal: Normal range of motion. She exhibits no edema.  Neurological: She is alert and oriented to person, place, and time.  Skin: Skin is warm. No rash noted. No erythema.  Psychiatric: Affect and judgment normal.      LABORATORY PANEL:   CBC  Recent Labs Lab 09/05/15 0604  WBC 1.6*  HGB 10.6*  HCT 31.8*  PLT 180   ------------------------------------------------------------------------------------------------------------------  Chemistries   Recent Labs Lab 09/04/15 1149 09/05/15 0604  NA 140 138  K 3.1* 3.5  CL 103 109  CO2 25 22  GLUCOSE 128* 156*  BUN 11 9  CREATININE 0.95 0.76  CALCIUM 8.9 7.4*  AST 86*  --   ALT 52  --   ALKPHOS 81  --   BILITOT 0.6  --    ------------------------------------------------------------------------------------------------------------------  Cardiac Enzymes  Recent Labs Lab 09/04/15 1149  TROPONINI <0.03   ------------------------------------------------------------------------------------------------------------------  RADIOLOGY:  Ct Abdomen Pelvis W Contrast  09/04/2015  CLINICAL DATA:  Nausea and vomiting and generalized pain for 1 week, history  of Crohn's disease EXAM: CT ABDOMEN AND PELVIS WITH CONTRAST TECHNIQUE: Multidetector CT imaging of the abdomen and pelvis was performed using the standard protocol following bolus administration of intravenous  contrast. CONTRAST:  172m OMNIPAQUE IOHEXOL 300 MG/ML  SOLN COMPARISON:  10/19/2013 FINDINGS: Lung bases are free of acute infiltrate or sizable effusion. Mild scarring is noted. The liver is diffusely fatty infiltrated without focal mass. The gallbladder has been surgically removed. The spleen, adrenal glands and pancreas are all normal in their CT appearance. The kidneys are well visualized bilaterally and demonstrate some nonobstructing renal stones on the right. The largest of these measures approximately 4 mm in greatest dimension. The ureters are within normal limits. No ureteral calculi are seen. The bladder is partially distended. No pelvic mass lesion is noted. There is persistent stranding in the central mesentery similar to that seen on the prior exam. No significant lymphadenopathy is noted. No bony abnormality is seen. Changes of prior multilevel vertebral augmentation are noted. The appendix has been surgically removed. IMPRESSION: Stable right renal calculus. Stable mesenteric stranding centrally when compared with the prior exam. Electronically Signed   By: MInez CatalinaM.D.   On: 09/04/2015 18:34     ASSESSMENT AND PLAN:   65year old female with a history of Crohn's disease who presents with Crohn's flare.  1. Crohn's flare with diarrhea: Continue IV steroids. Patient has been ruled out for C. difficile. Continue management as per GI. Change to a liquid diet to soft diet.  2. Hypokalemia: Resolved.  3. Leukopenia: Likely steroid induced. Continue to monitor white blood cell count.  4. Anemia of chronic disease: Continue monitor hemoglobin. No signs of acute blood loss.    Management plans discussed with the patient and she is in agreement.  CODE STATUS: Full   TOTAL TIME TAKING CARE OF THIS PATIENT: 30 minutes.     POSSIBLE D/C 2-3 days, DEPENDING ON CLINICAL CONDITION.   Areonna Bran M.D on 09/05/2015 at 10:14 AM  Between 7am to 6pm - Pager - [phone] After 6pm  go to [email or web address] - password [email or web address]  Office  [phone] CC: Primary care physician; SIdelle Crouch MD  Note: This dictation was prepared with Dragon dictation along with smaller phrase technology. Any transcriptional errors that result from this process are unintentional.

## 2015-09-05 NOTE — Consult Note (Signed)
GI Inpatient Consult Note  Reason for Consult: Crohn's exacerbation.   Attending Requesting Consult:  History of Present Illness: Nicole Bass is a 65 y.o. female who was diagnosed with Crohn's disease 15 years at the time of exploratory laparotomy.  Has been on MTX weekly injection x 4-5 years. Also has RA. Tried Enbrel and Humira both causing allergic reactions. Also has been on tapering prednisone with baseline dose at 10 mg per day. Unfortunately, due to multiple fractures, prednisone stopped completely in early Dec. Had sinusitis 2 weeks ago. Placed on Z pak. Then, few days ago, started to have significant diarrhea with abdominal cramping and urgency. BM's q 2 hrs. No rectal bleeding. C.diff neg. Started on IV prednisone last night. ESR/CRP elevated. MTX on hold due to recent sinus infection. Last dose about 2 weeks ago.  Past Medical History:  Past Medical History  Diagnosis Date  . Arthritis   . Degenerative disc disease, cervical   . Crohn's disease ([place])   . Anemia   . DDD (degenerative disc disease), cervical   . DDD (degenerative disc disease), lumbar   . Collagen vascular disease ([place])     Problem List: Patient Active Problem List   Diagnosis Date Noted  . Crohn's colitis ([place]) 09/04/2015    Past Surgical History: Past Surgical History  Procedure Laterality Date  . Back surgery    . Kyphoplasty    . Wrist fracture surgery Bilateral   . Abdominal hysterectomy    . Cesarean section  [place]  . Tonsillectomy    . Appendectomy    . Cholecystectomy    . Dilation and curettage of uterus  2004  . Elbow fracture surgery  2008  . Toe surgery  2013  . Joint replacement  2015    partial knee replacement  . Kyphoplasty N/A 04/14/2015    Procedure: KYPHOPLASTY;  Surgeon: Hessie Knows, MD;  Location: [facility] ORS;  Service: Orthopedics;  Laterality: N/A;  . Kyphoplasty N/A 06/04/2015    Procedure: KYPHOPLASTY L-5;  Surgeon: Hessie Knows, MD;  Location: [facility] ORS;   Service: Orthopedics;  Laterality: N/A;  . Medial partial knee replacement      Allergies: Allergies  Allergen Reactions  . Sulfa Antibiotics Other (See Comments)    Per pts MD she is unable to take this medication because it is contraindicated with her other medications.    . Cefazolin Itching and Rash  . Cephalosporins Itching and Rash  . Enbrel [Etanercept] Itching and Rash  . Humira [Adalimumab] Itching and Rash  . Levaquin [Levofloxacin In D5w] Itching and Rash  . Lorabid [Loracarbef] Itching and Rash  . Meropenem Itching and Rash  . Norco [Hydrocodone-Acetaminophen] Itching and Rash  . Primidone Itching and Rash  . Ultram [Tramadol Hcl] Itching and Rash    Home Medications: Prescriptions prior to admission  Medication Sig Dispense Refill Last Dose  . aspirin EC 81 MG tablet Take 81 mg by mouth daily.   09/03/2015 at 1100  . Biotin 1000 MCG tablet Take 1,000 mcg by mouth daily.   09/03/2015 at Unknown time  . cetirizine (ZYRTEC) 10 MG tablet Take 10 mg by mouth daily.   09/03/2015 at Unknown time  . Cholecalciferol (VITAMIN D3) 400 units CAPS Take 400 Units by mouth daily.   09/03/2015 at Unknown time  . clonazePAM (KLONOPIN) 1 MG tablet Take 1 mg by mouth at bedtime as needed (for sleep).    Past Week at Unknown time  . cyanocobalamin (,VITAMIN B-12,)  1000 MCG/ML injection Inject 1,000 mcg into the muscle every 30 (thirty) days.   07/24/2015 at unknown   . Cyanocobalamin (VITAMIN B-12) 6000 MCG SUBL Place 6,000 mcg under the tongue daily.   09/03/2015 at Unknown time  . diphenoxylate-atropine (LOMOTIL) 2.5-0.025 MG per tablet Take 1 tablet by mouth daily.    09/03/2015 at Unknown time  . fluticasone (FLONASE) 50 MCG/ACT nasal spray Place 1 spray into both nostrils daily.   09/03/2015 at Unknown time  . folic acid (FOLVITE) 1 MG tablet Take 1 mg by mouth daily.   09/03/2015 at Unknown time  . furosemide (LASIX) 20 MG tablet Take 20 mg by mouth daily.   09/03/2015 at Unknown time  .  magnesium oxide (MAG-OX) 400 MG tablet Take 400 mg by mouth daily.    09/03/2015 at Unknown time  . methotrexate 50 MG/2ML injection Inject 20 mg into the vein once a week. Pt uses on Friday.   08/28/2015 at unknown  . Multiple Vitamins-Minerals (MULTIVITAMIN WITH MINERALS) tablet Take 1 tablet by mouth daily.   09/03/2015 at Unknown time  . omeprazole (PRILOSEC) 40 MG capsule Take 40 mg by mouth daily.   09/03/2015 at Unknown time  . potassium chloride SA (K-DUR,KLOR-CON) 20 MEQ tablet Take 20 mEq by mouth daily.    09/03/2015 at Unknown time  . rOPINIRole (REQUIP) 0.5 MG tablet Take 0.5 mg by mouth at bedtime as needed (for restless legs).    Past Week at Unknown time  . sucralfate (CARAFATE) 1 g tablet Take 1 g by mouth 3 (three) times daily with meals as needed (for stomach cramping).   Past Month at Unknown time   Home medication reconciliation was completed with the patient.   Scheduled Inpatient Medications:   . aspirin EC  81 mg Oral Daily  . diphenoxylate-atropine  1 tablet Oral Daily  . enoxaparin (LOVENOX) injection  40 mg Subcutaneous Q24H  . fluticasone  1 spray Each Nare Daily  . folic acid  1 mg Oral Daily  . loratadine  10 mg Oral Daily  . magnesium oxide  400 mg Oral Daily  . methylPREDNISolone (SOLU-MEDROL) injection  60 mg Intravenous Q24H  . multivitamin with minerals  1 tablet Oral Q supper  . pantoprazole  40 mg Oral QAC breakfast  . potassium chloride SA  20 mEq Oral Daily    Continuous Inpatient Infusions:   . 0.9 % NaCl with KCl 20 mEq / L 100 mL/hr at 09/04/15 2309    PRN Inpatient Medications:  acetaminophen **OR** acetaminophen, albuterol, clonazePAM, morphine injection, ondansetron **OR** ondansetron (ZOFRAN) IV, oxyCODONE, rOPINIRole, sucralfate  Family History: family history is not on file.  The patient's family history is negative for inflammatory bowel disorders, GI malignancy, or solid organ transplantation.  Social History:   reports that she has  never smoked. She does not have any smokeless tobacco history on file. She reports that she does not drink alcohol or use illicit drugs. The patient denies ETOH, tobacco, or drug use.   Review of Systems: Constitutional: Weight is stable.  Eyes: No changes in vision. ENT: No oral lesions, sore throat.  GI: see HPI.  Heme/Lymph: No easy bruising.  CV: No chest pain.  GU: No hematuria.  Integumentary: No rashes.  Neuro: No headaches.  Psych: No depression/anxiety.  Endocrine: No heat/cold intolerance.  Allergic/Immunologic: No urticaria.  Resp: No cough, SOB.  Musculoskeletal: No joint swelling.    Physical Examination: BP 114/63 mmHg  Pulse 84  Temp(Src) 98.2  F (36.8 C) (Oral)  Resp 20  Ht 5' 3"  (1.6 m)  Wt 84.823 kg (187 lb)  BMI 33.13 kg/m2  SpO2 94% Gen: NAD, alert and oriented x 4 HEENT: PEERLA, EOMI, Neck: supple, no JVD or thyromegaly Chest: CTA bilaterally, no wheezes, crackles, or other adventitious sounds CV: RRR, no m/g/c/r Abd: soft, mild diffuse tenderness, ND, +BS in all four quadrants; no HSM, guarding, ridigity, or rebound tenderness Ext: no edema, well perfused with 2+ pulses, Skin: no rash or lesions noted Lymph: no LAD  Data: Lab Results  Component Value Date   WBC 1.6* 09/05/2015   HGB 10.6* 09/05/2015   HCT 31.8* 09/05/2015   MCV 88.1 09/05/2015   PLT 180 09/05/2015    Recent Labs Lab 09/04/15 1149 09/05/15 0604  HGB 12.9 10.6*   Lab Results  Component Value Date   NA 138 09/05/2015   K 3.5 09/05/2015   CL 109 09/05/2015   CO2 22 09/05/2015   BUN 9 09/05/2015   CREATININE 0.76 09/05/2015   Lab Results  Component Value Date   ALT 52 09/04/2015   AST 86* 09/04/2015   ALKPHOS 81 09/04/2015   BILITOT 0.6 09/04/2015   No results for input(s): APTT, INR, PTT in the last 168 hours. Assessment/Plan: Ms. Nicole Bass is a 65 y.o. female with Crohn's flare, exacerbated by recent Abx use.  Recommendations: IV steroids for next few days.  Would try to wean off prednisone quickly if possible due to multiple fx hx. Continue to moniter WBC. Check differential. Hold MTX for now. Will follow. Dr. Vira Agar to assume GI care on Monday.  Thank you for the consult. Please call with questions or concerns.  [place] Dodgen, Lupita Dawn, MD

## 2015-09-06 LAB — CBC
HEMATOCRIT: 32.3 % — AB (ref 35.0–47.0)
HEMOGLOBIN: 10.4 g/dL — AB (ref 12.0–16.0)
MCH: 28.6 pg (ref 26.0–34.0)
MCHC: 32.2 g/dL (ref 32.0–36.0)
MCV: 88.7 fL (ref 80.0–100.0)
Platelets: 163 10*3/uL (ref 150–440)
RBC: 3.64 MIL/uL — ABNORMAL LOW (ref 3.80–5.20)
RDW: 15.3 % — ABNORMAL HIGH (ref 11.5–14.5)
WBC: 2.9 10*3/uL — AB (ref 3.6–11.0)

## 2015-09-06 MED ORDER — BIOTIN 1000 MCG PO TABS: 1000.0000 ug | ORAL_TABLET | Freq: Every day | ORAL | Status: DC

## 2015-09-06 MED ORDER — VITAMIN B-12 1000 MCG PO TABS
6000.0000 ug | ORAL_TABLET | Freq: Every day | ORAL | Status: DC
  Administered 2015-09-06 – 2015-09-07 (×2): 6000 ug via ORAL
  Filled 2015-09-06 (×2): qty 6

## 2015-09-06 MED ORDER — MORPHINE SULFATE (PF) 2 MG/ML IV SOLN
2.0000 mg | Freq: Once | INTRAVENOUS | Status: AC
  Administered 2015-09-06: 2 mg via INTRAMUSCULAR

## 2015-09-06 MED ORDER — MORPHINE SULFATE (PF) 2 MG/ML IV SOLN
2.0000 mg | Freq: Once | INTRAVENOUS | Status: DC
  Filled 2015-09-06: qty 1

## 2015-09-06 MED ORDER — METHYLPREDNISOLONE SODIUM SUCC 40 MG IJ SOLR
40.0000 mg | INTRAMUSCULAR | Status: DC
  Administered 2015-09-06: 40 mg via INTRAVENOUS

## 2015-09-06 MED ORDER — CHOLECALCIFEROL 10 MCG (400 UNIT) PO TABS
400.0000 [IU] | ORAL_TABLET | Freq: Every day | ORAL | Status: DC
  Administered 2015-09-06 – 2015-09-07 (×2): 400 [IU] via ORAL
  Filled 2015-09-06 (×2): qty 1

## 2015-09-06 MED ORDER — METHYLPREDNISOLONE SODIUM SUCC 40 MG IJ SOLR
INTRAMUSCULAR | Status: AC
  Administered 2015-09-06: 40 mg via INTRAVENOUS
  Filled 2015-09-06: qty 1

## 2015-09-06 MED ORDER — DIBUCAINE 1 % RE OINT
TOPICAL_OINTMENT | RECTAL | Status: DC | PRN
  Filled 2015-09-06: qty 28

## 2015-09-06 NOTE — Progress Notes (Signed)
[facility] at [place] NAME: Nicole Bass    MR#:  [id number]  DATE OF BIRTH:  1951-01-16  SUBJECTIVE:   The amount of diarrhea has decreased. She has had no bowel movement this morning. She denies abdominal pain.Marland Kitchen  REVIEW OF SYSTEMS:    Review of Systems  Constitutional: Negative for fever, chills and malaise/fatigue.  HENT: Negative for sore throat.   Eyes: Negative for blurred vision.  Respiratory: Negative for cough, hemoptysis, shortness of breath and wheezing.   Cardiovascular: Negative for chest pain, palpitations and leg swelling.  Gastrointestinal: Positive for diarrhea (Improved). Negative for nausea, vomiting, abdominal pain and blood in stool.  Genitourinary: Negative for dysuria.  Musculoskeletal: Negative for back pain.  Skin: Positive for rash (on face and neck).  Neurological: Negative for dizziness, tremors and headaches.  Endo/Heme/Allergies: Does not bruise/bleed easily.    Tolerating Diet: Yes      DRUG ALLERGIES:   Allergies  Allergen Reactions  . Sulfa Antibiotics Other (See Comments)    Per pts MD she is unable to take this medication because it is contraindicated with her other medications.    . Cefazolin Itching and Rash  . Cephalosporins Itching and Rash  . Enbrel [Etanercept] Itching and Rash  . Humira [Adalimumab] Itching and Rash  . Levaquin [Levofloxacin In D5w] Itching and Rash  . Lorabid [Loracarbef] Itching and Rash  . Meropenem Itching and Rash  . Norco [Hydrocodone-Acetaminophen] Itching and Rash  . Primidone Itching and Rash  . Ultram [Tramadol Hcl] Itching and Rash    VITALS:  Blood pressure 133/69, pulse 73, temperature 97.5 F (36.4 C), temperature source Oral, resp. rate 18, height 5' 3"  (1.6 m), weight 86.41 kg (190 lb 8 oz), SpO2 96 %.  PHYSICAL EXAMINATION:   Physical Exam  Constitutional: She is oriented to person, place, and time and well-developed, well-nourished,  and in no distress. No distress.  HENT:  Head: Normocephalic.  Eyes: No scleral icterus.  Neck: Normal range of motion. Neck supple. No JVD present. No tracheal deviation present.  Cardiovascular: Normal rate, regular rhythm and normal heart sounds.  Exam reveals no gallop and no friction rub.   No murmur heard. Pulmonary/Chest: Effort normal and breath sounds normal. No respiratory distress. She has no wheezes. She has no rales. She exhibits no tenderness.  Abdominal: Soft. Bowel sounds are normal. She exhibits no distension and no mass. There is no tenderness. There is no rebound and no guarding.  Musculoskeletal: Normal range of motion. She exhibits no edema.  Neurological: She is alert and oriented to person, place, and time.  Skin: Skin is warm. Rash noted. No erythema. Pallor: face and neck.  Psychiatric: Affect and judgment normal.      LABORATORY PANEL:   CBC  Recent Labs Lab 09/06/15 0505  WBC 2.9*  HGB 10.4*  HCT 32.3*  PLT 163   ------------------------------------------------------------------------------------------------------------------  Chemistries   Recent Labs Lab 09/04/15 1149 09/05/15 0604  NA 140 138  K 3.1* 3.5  CL 103 109  CO2 25 22  GLUCOSE 128* 156*  BUN 11 9  CREATININE 0.95 0.76  CALCIUM 8.9 7.4*  AST 86*  --   ALT 52  --   ALKPHOS 81  --   BILITOT 0.6  --    ------------------------------------------------------------------------------------------------------------------  Cardiac Enzymes  Recent Labs Lab 09/04/15 1149  TROPONINI <0.03   ------------------------------------------------------------------------------------------------------------------  RADIOLOGY:  Ct Abdomen Pelvis W Contrast  09/04/2015  CLINICAL DATA:  Nausea and vomiting and generalized pain for 1 week, history of Crohn's disease EXAM: CT ABDOMEN AND PELVIS WITH CONTRAST TECHNIQUE: Multidetector CT imaging of the abdomen and pelvis was performed using the  standard protocol following bolus administration of intravenous contrast. CONTRAST:  166m OMNIPAQUE IOHEXOL 300 MG/ML  SOLN COMPARISON:  10/19/2013 FINDINGS: Lung bases are free of acute infiltrate or sizable effusion. Mild scarring is noted. The liver is diffusely fatty infiltrated without focal mass. The gallbladder has been surgically removed. The spleen, adrenal glands and pancreas are all normal in their CT appearance. The kidneys are well visualized bilaterally and demonstrate some nonobstructing renal stones on the right. The largest of these measures approximately 4 mm in greatest dimension. The ureters are within normal limits. No ureteral calculi are seen. The bladder is partially distended. No pelvic mass lesion is noted. There is persistent stranding in the central mesentery similar to that seen on the prior exam. No significant lymphadenopathy is noted. No bony abnormality is seen. Changes of prior multilevel vertebral augmentation are noted. The appendix has been surgically removed. IMPRESSION: Stable right renal calculus. Stable mesenteric stranding centrally when compared with the prior exam. Electronically Signed   By: MInez CatalinaM.D.   On: 09/04/2015 18:34     ASSESSMENT AND PLAN:   65year old female with a history of Crohn's disease who presents with Crohn's flare.  1. Crohn's flare with diarrhea: Diarrhea is improving and therefore I will decrease IV steroids. She can be transitioned to oral tomorrow with a very quick taper. .  2. Hypokalemia: Resolved.  3. Leukopenia: Likely steroid induced. Continue to monitor white blood cell count.  4. Anemia of chronic disease: Continue monitor hemoglobin. No signs of acute blood loss.  5. Rash: This could be from morphine. Stop morphine and reevaluate in a.m.  Management plans discussed with the patient and she is in agreement.  CODE STATUS: Full   TOTAL TIME TAKING CARE OF THIS PATIENT: 28 minutes.     POSSIBLE D/C 2-3 days,  DEPENDING ON CLINICAL CONDITION.   Avalina Benko M.D on 09/06/2015 at 9:49 AM  Between 7am to 6pm - Pager - [phone] After 6pm go to [email or web address] - password [email or web address]  Office  [phone] CC: Primary care physician; SIdelle Crouch MD  Note: This dictation was prepared with Dragon dictation along with smaller phrase technology. Any transcriptional errors that result from this process are unintentional.

## 2015-09-06 NOTE — Progress Notes (Addendum)
Notified MD pt pain control. pain upper abd 8/10 after eating lasagna for dinner. Orders taken. Notified MD pt SL leaking and pt refused restart due to being difficult stick, orders taken

## 2015-09-06 NOTE — Progress Notes (Signed)
PHARMACIST - PHYSICIAN ORDER COMMUNICATION  CONCERNING: P&T Medication Policy on Herbal Medications  DESCRIPTION:  This patient's order for:  Biotin  has been noted.  This product(s) is classified as an "herbal" or natural product. Due to a lack of definitive safety studies or FDA approval, nonstandard manufacturing practices, plus the potential risk of unknown drug-drug interactions while on inpatient medications, the Pharmacy and Therapeutics Committee does not permit the use of "herbal" or natural products of this type within [facility].   ACTION TAKEN: The pharmacy department is unable to verify this order at this time Please reevaluate patient's clinical condition at discharge and address if the herbal or natural product(s) should be resumed at that time.

## 2015-09-06 NOTE — Consult Note (Signed)
  GI Inpatient Follow-up Note  Patient Identification: Nicole Bass is a 65 y.o. female  Subjective: Stool starting to firm. Less abdominal pain. However, starting to cough. C/O sinus drainage.  Scheduled Inpatient Medications:  . aspirin EC  81 mg Oral Daily  . cholecalciferol  400 Units Oral Daily  . diphenoxylate-atropine  1 tablet Oral Daily  . enoxaparin (LOVENOX) injection  40 mg Subcutaneous Q24H  . fluticasone  1 spray Each Nare Daily  . folic acid  1 mg Oral Daily  . loratadine  10 mg Oral Daily  . magnesium oxide  400 mg Oral Daily  . methylPREDNISolone (SOLU-MEDROL) injection  60 mg Intravenous Q24H  . multivitamin with minerals  1 tablet Oral Q supper  . pantoprazole  40 mg Oral QAC breakfast  . potassium chloride SA  20 mEq Oral Daily  . vitamin B-12  6,000 mcg Oral Daily    Continuous Inpatient Infusions:     PRN Inpatient Medications:  acetaminophen **OR** acetaminophen, albuterol, clonazePAM, diphenhydrAMINE, morphine injection, ondansetron **OR** ondansetron (ZOFRAN) IV, oxyCODONE, rOPINIRole, sucralfate  Review of Systems: Constitutional: Weight is stable.  Eyes: No changes in vision. ENT: No oral lesions, sore throat.  GI: see HPI.  Heme/Lymph: No easy bruising.  CV: No chest pain.  GU: No hematuria.  Integumentary: No rashes.  Neuro: No headaches.  Psych: No depression/anxiety.  Endocrine: No heat/cold intolerance.  Allergic/Immunologic: No urticaria.  Resp: No cough, SOB.  Musculoskeletal: No joint swelling.    Physical Examination: BP 133/69 mmHg  Pulse 73  Temp(Src) 97.5 F (36.4 C) (Oral)  Resp 18  Ht 5' 3"  (1.6 m)  Wt 86.41 kg (190 lb 8 oz)  BMI 33.75 kg/m2  SpO2 96% Gen: NAD, alert and oriented x 4 HEENT: PEERLA, EOMI, Neck: supple, no JVD or thyromegaly Chest: CTA bilaterally, no wheezes, crackles, or other adventitious sounds CV: RRR, no m/g/c/r Abd: soft, NT, ND, +BS in all four quadrants; no HSM, guarding, ridigity, or  rebound tenderness Ext: no edema, well perfused with 2+ pulses, Skin: no rash or lesions noted Lymph: no LAD  Data: Lab Results  Component Value Date   WBC 2.9* 09/06/2015   HGB 10.4* 09/06/2015   HCT 32.3* 09/06/2015   MCV 88.7 09/06/2015   PLT 163 09/06/2015    Recent Labs Lab 09/04/15 1149 09/05/15 0604 09/06/15 0505  HGB 12.9 10.6* 10.4*   Lab Results  Component Value Date   NA 138 09/05/2015   K 3.5 09/05/2015   CL 109 09/05/2015   CO2 22 09/05/2015   BUN 9 09/05/2015   CREATININE 0.76 09/05/2015   Lab Results  Component Value Date   ALT 52 09/04/2015   AST 86* 09/04/2015   ALKPHOS 81 09/04/2015   BILITOT 0.6 09/04/2015   No results for input(s): APTT, INR, PTT in the last 168 hours. Assessment/Plan: Ms. Nicole Bass is a 65 y.o. female with Crohn's exacerbation. Getting better on IV steroids. WBC coming up.  Recommendations: Make sure pt is not developing respiratory infection. IV steroids for another day or so. Dr. Vira Agar to see pt tomorrow. Need to taper off steroids quickly due to her prior problems. thanks Please call with questions or concerns.  Antwyne Pingree, Lupita Dawn, MD

## 2015-09-06 NOTE — Progress Notes (Signed)
Paged Dr Vianne Bulls, and notified of pt request for anal cream for redness and pain; pt states she uses Nupercainal at home, and requested that be ordered here. Dr acknowledged, ordered Nupercainal cream PRN

## 2015-09-07 LAB — CBC
HCT: 33.2 % — ABNORMAL LOW (ref 35.0–47.0)
Hemoglobin: 11 g/dL — ABNORMAL LOW (ref 12.0–16.0)
MCH: 29.1 pg (ref 26.0–34.0)
MCHC: 33.2 g/dL (ref 32.0–36.0)
MCV: 87.9 fL (ref 80.0–100.0)
PLATELETS: 188 10*3/uL (ref 150–440)
RBC: 3.78 MIL/uL — AB (ref 3.80–5.20)
RDW: 15.1 % — AB (ref 11.5–14.5)
WBC: 4.1 10*3/uL (ref 3.6–11.0)

## 2015-09-07 MED ORDER — PREDNISONE 10 MG PO TABS
10.0000 mg | ORAL_TABLET | Freq: Every day | ORAL | Status: DC
Start: 2015-09-07 — End: 2016-04-04

## 2015-09-07 NOTE — Discharge Summary (Signed)
Nicole Bass at [place] NAME: Nicole Bass    MR#:  [id number]  DATE OF BIRTH:  1951-05-01  DATE OF ADMISSION:  09/04/2015 ADMITTING PHYSICIAN: Hillary Bow, MD  DATE OF DISCHARGE:09/07/2015  PRIMARY CARE PHYSICIAN: SPARKS,JEFFREY D, MD    ADMISSION DIAGNOSIS:  Crohn's colitis, without complications ([place]) [V77.93]  DISCHARGE DIAGNOSIS:  Active Problems:   Crohn's colitis ([place])   SECONDARY DIAGNOSIS:   Past Medical History  Diagnosis Date  . Arthritis   . Degenerative disc disease, cervical   . Crohn's disease ([place])   . Anemia   . DDD (degenerative disc disease), cervical   . DDD (degenerative disc disease), lumbar   . Collagen vascular disease [facility])     HOSPITAL COURSE:   65 year old female with a history of Crohn's disease who presents with Crohn's flare.  1. Crohn's flare with diarrhea: Diarrhea has improved remarkably. She was placed on IV steroids and now this is been changed oral steroids. She will be discharged with a one-week taper of steroids. She'll follow-up with Dr. Vira Agar within 1 week.   2. Hypokalemia: Resolved.  3. Leukopenia: Likely steroid induced. White blood cell count is within normal limits at discharge. 4. Anemia of chronic disease: . No signs of acute blood loss.  5. Rash: Patient had a facial rash which is either due to high-dose IV steroids or morphine. Her rash has improved with the discontinuation of IV steroids. She says that she had 1 dose of morphine and she did not have the rash, so this is likely from the IV steroids.   DISCHARGE CONDITIONS AND DIET:   She is stable for discharge on a regular diet  CONSULTS OBTAINED:  Treatment Team:  Hulen Luster, MD  DRUG ALLERGIES:   Allergies  Allergen Reactions  . Sulfa Antibiotics Other (See Comments)    Per pts MD she is unable to take this medication because it is contraindicated with her other medications.    . Cefazolin Itching and  Rash  . Cephalosporins Itching and Rash  . Enbrel [Etanercept] Itching and Rash  . Humira [Adalimumab] Itching and Rash  . Levaquin [Levofloxacin In D5w] Itching and Rash  . Lorabid [Loracarbef] Itching and Rash  . Meropenem Itching and Rash  . Norco [Hydrocodone-Acetaminophen] Itching and Rash  . Primidone Itching and Rash  . Ultram [Tramadol Hcl] Itching and Rash    DISCHARGE MEDICATIONS:   Current Discharge Medication List    START taking these medications   Details  predniSONE (DELTASONE) 10 MG tablet Take 1 tablet (10 mg total) by mouth daily with breakfast. Start at 60 mg daily taper by 10 mg daily at 10 mg daily for 1 day then STOP Qty: 20 tablet, Refills: 0      CONTINUE these medications which have NOT CHANGED   Details  aspirin EC 81 MG tablet Take 81 mg by mouth daily.    Biotin 1000 MCG tablet Take 1,000 mcg by mouth daily.    cetirizine (ZYRTEC) 10 MG tablet Take 10 mg by mouth daily.    Cholecalciferol (VITAMIN D3) 400 units CAPS Take 400 Units by mouth daily.    clonazePAM (KLONOPIN) 1 MG tablet Take 1 mg by mouth at bedtime as needed (for sleep).     cyanocobalamin (,VITAMIN B-12,) 1000 MCG/ML injection Inject 1,000 mcg into the muscle every 30 (thirty) days.    Cyanocobalamin (VITAMIN B-12) 6000 MCG SUBL Place 6,000 mcg under the tongue daily.  diphenoxylate-atropine (LOMOTIL) 2.5-0.025 MG per tablet Take 1 tablet by mouth daily.     fluticasone (FLONASE) 50 MCG/ACT nasal spray Place 1 spray into both nostrils daily.    folic acid (FOLVITE) 1 MG tablet Take 1 mg by mouth daily.    furosemide (LASIX) 20 MG tablet Take 20 mg by mouth daily.    magnesium oxide (MAG-OX) 400 MG tablet Take 400 mg by mouth daily.     methotrexate 50 MG/2ML injection Inject 20 mg into the vein once a week. Pt uses on Friday.    Multiple Vitamins-Minerals (MULTIVITAMIN WITH MINERALS) tablet Take 1 tablet by mouth daily.    omeprazole (PRILOSEC) 40 MG capsule Take 40 mg  by mouth daily.    potassium chloride SA (K-DUR,KLOR-CON) 20 MEQ tablet Take 20 mEq by mouth daily.     rOPINIRole (REQUIP) 0.5 MG tablet Take 0.5 mg by mouth at bedtime as needed (for restless legs).     sucralfate (CARAFATE) 1 g tablet Take 1 g by mouth 3 (three) times daily with meals as needed (for stomach cramping).              Today   CHIEF COMPLAINT:  Patient is doing well this morning. Patient reports no abdominal pain. She has no diarrhea.   VITAL SIGNS:  Blood pressure 127/73, pulse 70, temperature 97.9 F (36.6 C), temperature source Oral, resp. rate 20, height 5' 3"  (1.6 m), weight 87.272 kg (192 lb 6.4 oz), SpO2 97 %.   REVIEW OF SYSTEMS:  Review of Systems  Constitutional: Negative for fever, chills and malaise/fatigue.  HENT: Negative for sore throat.   Eyes: Negative for blurred vision.  Respiratory: Negative for cough, hemoptysis, shortness of breath and wheezing.   Cardiovascular: Negative for chest pain, palpitations and leg swelling.  Gastrointestinal: Negative for nausea, vomiting, abdominal pain, diarrhea and blood in stool.  Genitourinary: Negative for dysuria.  Musculoskeletal: Negative for back pain.  Neurological: Negative for dizziness, tremors and headaches.  Endo/Heme/Allergies: Does not bruise/bleed easily.     PHYSICAL EXAMINATION:  GENERAL:  65 y.o.-year-old patient lying in the bed with no acute distress.  NECK:  Supple, no jugular venous distention. No thyroid enlargement, no tenderness.  LUNGS: Normal breath sounds bilaterally, no wheezing, rales,rhonchi  No use of accessory muscles of respiration.  CARDIOVASCULAR: S1, S2 normal. No murmurs, rubs, or gallops.  ABDOMEN: Soft, non-tender, non-distended. Bowel sounds present. No organomegaly or mass.  EXTREMITIES: No pedal edema, cyanosis, or clubbing.  PSYCHIATRIC: The patient is alert and oriented x 3.  SKIN: No obvious rash, lesion, or ulcer.   DATA REVIEW:   CBC  Recent  Labs Lab 09/07/15 0601  WBC 4.1  HGB 11.0*  HCT 33.2*  PLT 188    Chemistries   Recent Labs Lab 09/04/15 1149 09/05/15 0604  NA 140 138  K 3.1* 3.5  CL 103 109  CO2 25 22  GLUCOSE 128* 156*  BUN 11 9  CREATININE 0.95 0.76  CALCIUM 8.9 7.4*  AST 86*  --   ALT 52  --   ALKPHOS 81  --   BILITOT 0.6  --     Cardiac Enzymes  Recent Labs Lab 09/04/15 1149  TROPONINI <0.03    Microbiology Results  @MICRORSLT48 @  RADIOLOGY:  No results found.    Management plans discussed with the patient and she is in agreement. Stable for discharge home  Patient should follow up with Dr Vira Agar in one week  CODE STATUS:  Code Status Orders        Start     Ordered   09/04/15 1956  Full code   Continuous     09/04/15 1956    Code Status History    Date Active Date Inactive Code Status Order ID Comments User Context   06/04/2015 11:06 AM 06/04/2015  3:08 PM Full Code [id number]  Hessie Knows, MD Inpatient   04/14/2015  6:40 PM 04/14/2015 10:45 PM Full Code [id number]  Hessie Knows, MD Inpatient      TOTAL TIME TAKING CARE OF THIS PATIENT: 35 minutes.    Note: This dictation was prepared with Dragon dictation along with smaller phrase technology. Any transcriptional errors that result from this process are unintentional.  Nicole Bass M.D on 09/07/2015 at 10:36 AM  Between 7am to 6pm - Pager - [phone] After 6pm go to [email or web address] - password [email or web address] [facility]  Office  [phone]  CC: Primary care physician; [name], MD

## 2015-09-07 NOTE — Discharge Instructions (Signed)
Crohn Disease Crohn disease is a long-lasting (chronic) disease that affects your gastrointestinal (GI) tract. It often causes irritation and swelling (inflammation) in your small intestine and the beginning of your large intestine. However, it can affect any part of your GI tract. Crohn disease is part of a group of illnesses that are known as inflammatory bowel disease (IBD). Crohn disease may start slowly and get worse over time. Symptoms may come and go. They may also disappear for months or even years at a time (remission). CAUSES The exact cause of Crohn disease is not known. It may be a response that causes your body's defense system (immune system) to mistakenly attack healthy cells and tissues (autoimmune response). Your genes and your environment may also play a role. RISK FACTORS You may be at greater risk for Crohn disease if you:  Have other family members with Crohn disease or another IBD.  Use any tobacco products, including cigarettes, chewing tobacco, or electronic cigarettes.  Are in your 39s.  Have [place] European ancestry. SIGNS AND SYMPTOMS The main signs and symptoms of Crohn disease involve your GI tract. These include:  Diarrhea.  Rectal bleeding.  An urgent need to move your bowels.  The feeling that you are not finished having a bowel movement.  Abdominal pain or cramping.  Constipation. General signs and symptoms of Crohn disease may also include:  Unexplained weight loss.  Fatigue.  Fever.  Nausea.  Loss of appetite.  Joint pain  Changes in vision.  Red bumps on your skin. DIAGNOSIS Your health care provider may suspect Crohn disease based on your symptoms and your medical history. Your health care provider will do a physical exam. You may need to see a health care provider who specializes in diseases of the digestive tract (gastroenterologist). You may also have tests to help your health care providers make a diagnosis. These may  include:  Blood tests.  Stool sample tests.  Imaging tests, such as X-rays and CT scans.  Tests to examine the inside of your intestines using a long, flexible tube that has a light and a camera on the end (endoscopy or colonoscopy).  A procedure to take tissue samples from inside your bowel (biopsy) to be examined under a microscope. TREATMENT  There is no cure for Crohn disease. Treatment will focus on managing your symptoms. Crohn disease affects each person differently. Your treatment may include:  Resting your bowels. Drinking only clear liquids or getting nutrition through an IV for a period of time gives your bowels a chance to heal because they are not passing stools.  Medicines. These may be used alone or in combination (combination therapy). These may include antibiotic medicines. You may be given medicines that help to:  Reduce inflammation.  Control your immune system activity.  Fight infections.  Relieve cramps and prevent diarrhea.  Control your pain.  Surgery. You may need surgery if:  Medicines and other treatments are no longer working.  You develop complications from severe Crohn disease.  A section of your intestine becomes so damaged that it needs to be removed. HOME CARE INSTRUCTIONS  Take medicines only as directed by your health care provider.  If you were prescribed an antibiotic medicine, finish it all even if you start to feel better.  Keep all follow-up visits as directed by your health care provider. This is important.  Talk with your health care provider about changing your diet. This may help your symptoms. Your health care provide may recommend changes, such  as:  Drinking more fluids.  Avoiding milk and other foods that contain lactose.  Eating a low-fat diet.  Avoiding high-fiber foods, such as popcorn and nuts.  Avoiding carbonated beverages, such as soda.  Eating smaller meals more often rather than eating large  meals.  Keeping a food diary to identify foods that make your symptoms better or worse.  Do not use any tobacco products, including cigarettes, chewing tobacco, or electronic cigarettes. If you need help quitting, ask your health care provider.  Limit alcohol intake to no more than 1 drink per day for nonpregnant women and 2 drinks per day for men. One drink equals 12 ounces of beer, 5 ounces of wine, or 1 ounces of hard liquor.  Exercise daily or as directed by your health care provider. SEEK MEDICAL CARE IF:  You have diarrhea, abdominal cramps, and other gastrointestinal problems that are present almost all of the time.  Your symptoms do not improve with treatment.  You continue to lose weight.  You develop a rash or sores on your skin.  You develop eye problems.  You have a fever.   Your symptoms get worse.  You develop new symptoms. SEEK IMMEDIATE MEDICAL CARE IF:  You have bloody diarrhea.  You develop severe abdominal pain.  You cannot pass stools.   This information is not intended to replace advice given to you by your health care provider. Make sure you discuss any questions you have with your health care provider.   Document Released: 05/11/2005 Document Revised: 08/22/2014 Document Reviewed: 03/19/2014 Elsevier Interactive Patient Education [employer].

## 2015-09-07 NOTE — Care Management Important Message (Signed)
Important Message  Patient Details  Name: Nicole Bass MRN: [id number] Date of Birth: [birth year]   Medicare Important Message Given:  Yes    Juliann Pulse A Toshi Ishii 09/07/2015, 10:34 AM

## 2015-09-17 DIAGNOSIS — S52501D Unspecified fracture of the lower end of right radius, subsequent encounter for closed fracture with routine healing: Secondary | ICD-10-CM | POA: Insufficient documentation

## 2015-09-18 ENCOUNTER — Other Ambulatory Visit: Payer: Self-pay | Admitting: Neurology

## 2015-09-18 DIAGNOSIS — R42 Dizziness and giddiness: Secondary | ICD-10-CM

## 2015-10-06 ENCOUNTER — Ambulatory Visit: Payer: Medicare Other

## 2015-10-08 ENCOUNTER — Ambulatory Visit
Admission: RE | Admit: 2015-10-08 | Discharge: 2015-10-08 | Disposition: A | Discharge status: Home / Self Care | Payer: Medicare Other | Source: Ambulatory Visit | Attending: Neurology | Admitting: Neurology

## 2015-10-08 DIAGNOSIS — R42 Dizziness and giddiness: Secondary | ICD-10-CM | POA: Insufficient documentation

## 2015-10-08 DIAGNOSIS — I6782 Cerebral ischemia: Secondary | ICD-10-CM | POA: Insufficient documentation

## 2015-10-08 MED ORDER — GADOBENATE DIMEGLUMINE 529 MG/ML IV SOLN
20.0000 mL | Freq: Once | INTRAVENOUS | Status: AC | PRN
  Administered 2015-10-08: 18 mL via INTRAVENOUS

## 2015-10-27 DIAGNOSIS — E1140 Type 2 diabetes mellitus with diabetic neuropathy, unspecified: Secondary | ICD-10-CM | POA: Insufficient documentation

## 2015-12-09 ENCOUNTER — Other Ambulatory Visit: Payer: Self-pay | Admitting: Orthopedic Surgery

## 2015-12-09 DIAGNOSIS — M461 Sacroiliitis, not elsewhere classified: Secondary | ICD-10-CM

## 2015-12-09 DIAGNOSIS — M545 Low back pain, unspecified: Secondary | ICD-10-CM

## 2015-12-17 ENCOUNTER — Encounter
Admission: RE | Admit: 2015-12-17 | Discharge: 2015-12-17 | Disposition: A | Discharge status: Home / Self Care | Payer: Medicare Other | Source: Ambulatory Visit | Attending: Orthopedic Surgery | Admitting: Orthopedic Surgery

## 2015-12-17 DIAGNOSIS — M545 Low back pain, unspecified: Secondary | ICD-10-CM

## 2015-12-17 DIAGNOSIS — M461 Sacroiliitis, not elsewhere classified: Secondary | ICD-10-CM | POA: Insufficient documentation

## 2015-12-17 MED ORDER — TECHNETIUM TC 99M MEDRONATE IV KIT
25.0000 | PACK | Freq: Once | INTRAVENOUS | Status: AC | PRN
  Administered 2015-12-17: 22.09 via INTRAVENOUS

## 2016-01-27 DIAGNOSIS — M25511 Pain in right shoulder: Secondary | ICD-10-CM | POA: Insufficient documentation

## 2016-02-05 ENCOUNTER — Ambulatory Visit
Admission: RE | Admit: 2016-02-05 | Payer: Medicare Other | Source: Ambulatory Visit | Admitting: Unknown Physician Specialty

## 2016-02-05 ENCOUNTER — Encounter: Admission: RE | Payer: Self-pay | Source: Ambulatory Visit

## 2016-02-05 SURGERY — COLONOSCOPY WITH PROPOFOL
Anesthesia: General

## 2016-02-09 ENCOUNTER — Other Ambulatory Visit: Payer: Self-pay | Admitting: Obstetrics and Gynecology

## 2016-02-09 DIAGNOSIS — Z1231 Encounter for screening mammogram for malignant neoplasm of breast: Secondary | ICD-10-CM

## 2016-02-19 ENCOUNTER — Other Ambulatory Visit: Payer: Self-pay | Admitting: Orthopedic Surgery

## 2016-02-19 DIAGNOSIS — M25511 Pain in right shoulder: Secondary | ICD-10-CM

## 2016-02-24 ENCOUNTER — Other Ambulatory Visit: Payer: Self-pay | Admitting: Obstetrics and Gynecology

## 2016-02-24 ENCOUNTER — Ambulatory Visit
Admission: RE | Admit: 2016-02-24 | Discharge: 2016-02-24 | Disposition: A | Discharge status: Home / Self Care | Payer: Medicare Other | Source: Ambulatory Visit | Attending: Obstetrics and Gynecology | Admitting: Obstetrics and Gynecology

## 2016-02-24 DIAGNOSIS — Z1231 Encounter for screening mammogram for malignant neoplasm of breast: Secondary | ICD-10-CM

## 2016-03-02 ENCOUNTER — Ambulatory Visit
Admission: RE | Admit: 2016-03-02 | Discharge: 2016-03-02 | Disposition: A | Discharge status: Home / Self Care | Payer: Medicare Other | Source: Ambulatory Visit | Attending: Orthopedic Surgery | Admitting: Orthopedic Surgery

## 2016-03-02 DIAGNOSIS — M75111 Incomplete rotator cuff tear or rupture of right shoulder, not specified as traumatic: Secondary | ICD-10-CM | POA: Diagnosis not present

## 2016-03-02 DIAGNOSIS — M19011 Primary osteoarthritis, right shoulder: Secondary | ICD-10-CM | POA: Diagnosis not present

## 2016-03-02 DIAGNOSIS — M7551 Bursitis of right shoulder: Secondary | ICD-10-CM | POA: Diagnosis not present

## 2016-03-02 DIAGNOSIS — M25511 Pain in right shoulder: Secondary | ICD-10-CM

## 2016-03-08 DIAGNOSIS — M75121 Complete rotator cuff tear or rupture of right shoulder, not specified as traumatic: Secondary | ICD-10-CM | POA: Insufficient documentation

## 2016-04-04 ENCOUNTER — Encounter
Admission: RE | Admit: 2016-04-04 | Discharge: 2016-04-04 | Disposition: A | Discharge status: Home / Self Care | Payer: Medicare Other | Source: Ambulatory Visit | Attending: Unknown Physician Specialty | Admitting: Unknown Physician Specialty

## 2016-04-04 DIAGNOSIS — Z01812 Encounter for preprocedural laboratory examination: Secondary | ICD-10-CM | POA: Insufficient documentation

## 2016-04-04 LAB — DIFFERENTIAL
BASOS PCT: 1 %
Basophils Absolute: 0.1 10*3/uL (ref 0–0.1)
EOS ABS: 0.2 10*3/uL (ref 0–0.7)
EOS PCT: 2 %
Lymphocytes Relative: 19 %
Lymphs Abs: 1.6 10*3/uL (ref 1.0–3.6)
MONO ABS: 1 10*3/uL — AB (ref 0.2–0.9)
MONOS PCT: 12 %
NEUTROS ABS: 5.8 10*3/uL (ref 1.4–6.5)
Neutrophils Relative %: 66 %

## 2016-04-04 LAB — CBC
HCT: 35.6 % (ref 35.0–47.0)
Hemoglobin: 12.4 g/dL (ref 12.0–16.0)
MCH: 32.6 pg (ref 26.0–34.0)
MCHC: 34.8 g/dL (ref 32.0–36.0)
MCV: 93.6 fL (ref 80.0–100.0)
PLATELETS: 235 10*3/uL (ref 150–440)
RBC: 3.8 MIL/uL (ref 3.80–5.20)
RDW: 14.7 % — AB (ref 11.5–14.5)
WBC: 8.7 10*3/uL (ref 3.6–11.0)

## 2016-04-04 NOTE — Patient Instructions (Addendum)
  Your procedure is scheduled on:Wednesday August 30th, 2017. Report to Same Day Surgery. To find out your arrival time please call [phone] between 1PM - 3PM on Tuesday April 12, 2016.  Remember: Instructions that are not followed completely may result in serious medical risk, up to and including death, or upon the discretion of your surgeon and anesthesiologist your surgery may need to be rescheduled.    _x___ 1. Do not eat food or drink liquids after midnight. No gum chewing or hard candies.     ____ 2. No Alcohol for 24 hours before or after surgery.   ____ 3. Bring all medications with you on the day of surgery if instructed.    __x__ 4. Notify your doctor if there is any change in your medical condition     (cold, fever, infections).     Do not wear jewelry, make-up, hairpins, clips or nail polish.  Do not wear lotions, powders, or perfumes. You may wear deodorant.  Do not shave 48 hours prior to surgery. Men may shave face and neck.  Do not bring valuables to the hospital.    [facility] is not responsible for any belongings or valuables.               Contacts, dentures or bridgework may not be worn into surgery.  Leave your suitcase in the car. After surgery it may be brought to your room.  For patients admitted to the hospital, discharge time is determined by your treatment team.   Patients discharged the day of surgery will not be allowed to drive home.    Please read over the following fact sheets that you were given:   [facility] Preparing for Surgery  _x___ Take these medicines the morning of surgery with A SIP OF WATER:    1. omeprazole (PRILOSEC), take an extra dose at bedtime the night prior to surgery   ____ Fleet Enema (as directed)   __x__ Use CHG Soap as directed on instruction sheet  ____ Use inhalers on the day of surgery and bring to hospital day of surgery  ____ Stop metformin 2 days prior to surgery    ____ Take 1/2 of usual insulin dose the  night before surgery and none on the morning of surgery.   __x__ Dr. Scharlene Gloss office will call to instruct patient regarding stopping aspirin.   __x  Stop Anti-inflammatories such as Advil, Aleve, Ibuprofen, Motrin, Naproxen,  Naprosyn, Goodies powders or aspirin products. OK to take Tylenol or NUCYNTA  _x___ Stop supplements:Biotin,Calcium-Vitamin D-Vitamin K (VIACTIV PO), Cyanocobalamin (VITAMIN J-24), folic acid (FOLVITE)  until after surgery.    ____ Bring C-Pap to the hospital.

## 2016-04-04 NOTE — Pre-Procedure Instructions (Deleted)
Pt is allergic to multiple antibiotics including cefazolin/ cephalosporins, unable to enter Ancef 1 gram IVPB pre-op order.  Message left on Cindy's voicemail regarding above and how to proceed as instructed by Dr. Little Ishikawa.

## 2016-04-04 NOTE — Pre-Procedure Instructions (Signed)
EKG from 09/04/2015 compared to 06/09/2014 no change per S. Filed's Therapist, sports.  OK to proceed.

## 2016-04-04 NOTE — Pre-Procedure Instructions (Signed)
Dr. Doy Hutching clearance for surgery enclosed on chart.

## 2016-04-04 NOTE — Pre-Procedure Instructions (Signed)
Pt contemplated sucide after husband's death, was treated and is no longer depressed.  Has recently remarried and is very happy.

## 2016-04-07 DIAGNOSIS — Z7982 Long term (current) use of aspirin: Secondary | ICD-10-CM | POA: Diagnosis not present

## 2016-04-07 DIAGNOSIS — Z79899 Other long term (current) drug therapy: Secondary | ICD-10-CM | POA: Diagnosis not present

## 2016-04-07 DIAGNOSIS — M25511 Pain in right shoulder: Secondary | ICD-10-CM | POA: Insufficient documentation

## 2016-04-07 NOTE — ED Triage Notes (Signed)
Patient ambulatory to triage with steady gait, without difficulty or distress noted; pt reports sched for right shoulder repair but having persistent uncontrolled pain to site; took Tylenol 1045pm, nucynta at 1pm without relief

## 2016-04-08 ENCOUNTER — Emergency Department
Admission: EM | Admit: 2016-04-08 | Discharge: 2016-04-08 | Disposition: A | Discharge status: Home / Self Care | Payer: Medicare Other | Attending: Emergency Medicine | Admitting: Emergency Medicine

## 2016-04-08 DIAGNOSIS — M25511 Pain in right shoulder: Secondary | ICD-10-CM

## 2016-04-08 MED ORDER — HYDROMORPHONE HCL 1 MG/ML IJ SOLN
2.0000 mg | Freq: Once | INTRAMUSCULAR | Status: AC
  Administered 2016-04-08: 2 mg via INTRAMUSCULAR
  Filled 2016-04-08: qty 2

## 2016-04-08 MED ORDER — DIAZEPAM 2 MG PO TABS
2.0000 mg | ORAL_TABLET | Freq: Once | ORAL | Status: AC
  Administered 2016-04-08: 2 mg via ORAL
  Filled 2016-04-08: qty 1

## 2016-04-08 MED ORDER — HYDROMORPHONE HCL 2 MG PO TABS: 2.0000 mg | ORAL_TABLET | Freq: Four times a day (QID) | ORAL | 0 refills | Status: DC | PRN

## 2016-04-08 MED ORDER — ONDANSETRON 4 MG PO TBDP
4.0000 mg | ORAL_TABLET | Freq: Once | ORAL | Status: AC
  Administered 2016-04-08: 4 mg via ORAL
  Filled 2016-04-08: qty 1

## 2016-04-08 NOTE — Discharge Instructions (Signed)
You may take Dilaudid as needed for breakthrough pain (#15). Return to the ER for worsening symptoms, persistent vomiting, difficulty breathing or other concerns.

## 2016-04-08 NOTE — ED Provider Notes (Signed)
[facility], [place] Emergency Department Provider Note   ____________________________________________   First MD Initiated Contact with Patient 04/08/16 [phone]     (approximate)  I have reviewed the triage vital signs and the nursing notes.   HISTORY  Chief Complaint Shoulder Pain    HPI Nicole Bass is a 65 y.o. female who presents to the ED from home with a chief complaint of right shoulder pain.Patient is scheduled for right rotator cuff repair next week but reports having persistent and uncontrolled pain all day. She took 2 doses of Nucynta as well as Tylenol without any relief of her symptoms. Denies recent injury or trauma. States her shoulder felt like it was "freezing" today. Patient is right-hand dominant. Denies recent fever, chills, chest pain, shortness of breath, abdominal pain, nausea, vomiting, diarrhea. Nothing makes her pain better. Movement makes her pain worse.   Past Medical History:  Diagnosis Date  . Anemia   . Arthritis   . Collagen vascular disease ([place])   . Crohn's disease ([place])   . DDD (degenerative disc disease), cervical   . DDD (degenerative disc disease), cervical   . DDD (degenerative disc disease), cervical 2003  . DDD (degenerative disc disease), lumbar   . Degenerative disc disease, cervical     Patient Active Problem List   Diagnosis Date Noted  . Crohn's colitis ([place]) 09/04/2015    Past Surgical History:  Procedure Laterality Date  . ABDOMINAL HYSTERECTOMY    . APPENDECTOMY    . BACK SURGERY     x 3  . [place]  . CHOLECYSTECTOMY    . COLON SURGERY  1999   removed 18 inches of colon  . DILATION AND CURETTAGE OF UTERUS  2004  . ELBOW FRACTURE SURGERY  2008  . JOINT REPLACEMENT Right 2015   partial knee replacement  . KYPHOPLASTY    . KYPHOPLASTY N/A 04/14/2015   Procedure: KYPHOPLASTY;  Surgeon: Hessie Knows, MD;  Location: [facility] ORS;  Service: Orthopedics;  Laterality: N/A;  .  KYPHOPLASTY N/A 06/04/2015   Procedure: KYPHOPLASTY L-5;  Surgeon: Hessie Knows, MD;  Location: [facility] ORS;  Service: Orthopedics;  Laterality: N/A;  . MEDIAL PARTIAL KNEE REPLACEMENT    . OOPHORECTOMY Bilateral   . TOE SURGERY  2013  . TONSILLECTOMY    . WRIST FRACTURE SURGERY Bilateral     Prior to Admission medications   Medication Sig Start Date End Date Taking? Authorizing Provider  aspirin EC 81 MG tablet Take 81 mg by mouth daily.    Historical Provider, MD  Biotin 1000 MCG tablet Take 1,000 mcg by mouth daily.    Historical Provider, MD  Calcium-Vitamin D-Vitamin K (VIACTIV PO) Take 2 Doses by mouth every evening.    Historical Provider, MD  cetirizine (ZYRTEC) 10 MG tablet Take 10 mg by mouth daily.    Historical Provider, MD  clonazePAM (KLONOPIN) 1 MG tablet Take 1 mg by mouth at bedtime as needed (for sleep).     Historical Provider, MD  cyanocobalamin (,VITAMIN B-12,) 1000 MCG/ML injection Inject 1,000 mcg into the muscle every 30 (thirty) days.    Historical Provider, MD  Cyanocobalamin (VITAMIN B-12) 6000 MCG SUBL Place 6,000 mcg under the tongue daily.    Historical Provider, MD  diphenoxylate-atropine (LOMOTIL) 2.5-0.025 MG per tablet Take 2 tablets by mouth every morning.     Historical Provider, MD  folic acid (FOLVITE) 1 MG tablet Take 1 mg by mouth daily.  Historical Provider, MD  furosemide (LASIX) 20 MG tablet Take 20 mg by mouth every morning.     Historical Provider, MD  magnesium oxide (MAG-OX) 400 MG tablet Take 400 mg by mouth daily.     Historical Provider, MD  METHOTREXATE SODIUM IJ Inject 8 mg as directed once a week. Subcutaneous on Sundays    Historical Provider, MD  Multiple Vitamins-Minerals (MULTIVITAMIN WITH MINERALS) tablet Take 1 tablet by mouth daily.    Historical Provider, MD  omeprazole (PRILOSEC) 40 MG capsule Take 40 mg by mouth every morning.     Historical Provider, MD  potassium chloride SA (K-DUR,KLOR-CON) 20 MEQ tablet Take 20 mEq by mouth  daily.     Historical Provider, MD  Probiotic Product (PROBIOTIC PO) Take 2 tablets by mouth every morning.    Historical Provider, MD  rOPINIRole (REQUIP) 0.5 MG tablet Take 0.5 mg by mouth at bedtime as needed (for restless legs).     Historical Provider, MD  sucralfate (CARAFATE) 1 g tablet Take 1 g by mouth 3 (three) times daily with meals as needed (for stomach cramping).    Historical Provider, MD  tapentadol (NUCYNTA) 50 MG tablet Take 50 mg by mouth every 6 (six) hours as needed.    Historical Provider, MD    Allergies Sulfa antibiotics; Cefazolin; Cephalosporins; Enbrel [etanercept]; Humira [adalimumab]; Levaquin [levofloxacin in d5w]; Lorabid [loracarbef]; Meropenem; Norco [hydrocodone-acetaminophen]; Primidone; and Ultram [tramadol hcl]  Family History  Problem Relation Age of Onset  . Breast cancer Neg Hx     Social History Social History  Substance Use Topics  . Smoking status: Never Smoker  . Smokeless tobacco: Never Used  . Alcohol use No    Review of Systems  Constitutional: No fever/chills. Eyes: No visual changes. ENT: No sore throat. Cardiovascular: Denies chest pain. Respiratory: Denies shortness of breath. Gastrointestinal: No abdominal pain.  No nausea, no vomiting.  No diarrhea.  No constipation. Genitourinary: Negative for dysuria. Musculoskeletal: Positive for right shoulder pain. Negative for back pain. Skin: Negative for rash. Neurological: Negative for headaches, focal weakness or numbness.  10-point ROS otherwise negative.  ____________________________________________   PHYSICAL EXAM:  VITAL SIGNS: ED Triage Vitals  Enc Vitals Group     BP 04/07/16 2354 (!) 146/80     Pulse Rate 04/07/16 2354 97     Resp 04/07/16 2354 (!) 24     Temp 04/07/16 2354 98 F (36.7 C)     Temp Source 04/07/16 2354 Oral     SpO2 04/07/16 2354 98 %     Weight 04/07/16 2348 177 lb (80.3 kg)     Height 04/07/16 2348 5' 3"  (1.6 m)     Head Circumference --       Peak Flow --      Pain Score 04/07/16 2349 10     Pain Loc --      Pain Edu? --      Excl. in [place]? --     Constitutional: Alert and oriented. Well appearing and in moderate acute distress. Tearful. Eyes: Conjunctivae are normal. PERRL. EOMI. Head: Atraumatic. Nose: No congestion/rhinnorhea. Mouth/Throat: Mucous membranes are moist.  Oropharynx non-erythematous. Neck: No stridor.   Cardiovascular: Normal rate, regular rhythm. Grossly normal heart sounds.  Good peripheral circulation. Respiratory: Normal respiratory effort.  No retractions. Lungs CTAB. Gastrointestinal: Soft and nontender. No distention. No abdominal bruits. No CVA tenderness. Musculoskeletal: Right shoulder tender to palpation and with limited range of motion secondary to pain. 2+ radial pulse. Equal grip  strength. Neurologic:  Normal speech and language. No gross focal neurologic deficits are appreciated. No gait instability. Skin:  Skin is warm, dry and intact. No rash noted. Psychiatric: Mood and affect are normal. Speech and behavior are normal.  ____________________________________________   LABS (all labs ordered are listed, but only abnormal results are displayed)  Labs Reviewed - No data to display ____________________________________________  EKG  None ____________________________________________  RADIOLOGY  None ____________________________________________   PROCEDURES  Procedure(s) performed: None  Procedures  Critical Care performed: No  ____________________________________________   INITIAL IMPRESSION / ASSESSMENT AND PLAN / ED COURSE  Pertinent labs & imaging results that were available during my care of the patient were reviewed by me and considered in my medical decision making (see chart for details).  65 year old female who presents with worsening right shoulder pain. She is scheduled for right rotator cuff repair next week. Took 2 doses of Nucynta yesterday afternoon without  relief of symptoms. She is tearful and in severe pain. Declines IV secondary to being a "hard stick". Will administer IM Dilaudid; oral Valium for spasms and reassess.  Clinical Course  Comment By Time  Patient is not tearful. Although she states her shoulder still hurts, it is now 5/10. Will place patient in sling, limited prescription for Dilaudid for breakthrough pain and patient to call her orthopedics office in the morning. Strict return precautions given. Patient's spouse verbalized understanding and agree with plan of care. Paulette Blanch, MD 08/25 0413     ____________________________________________   FINAL CLINICAL IMPRESSION(S) / ED DIAGNOSES  Final diagnoses:  Shoulder pain, right      NEW MEDICATIONS STARTED DURING THIS VISIT:  New Prescriptions   No medications on file     Note:  This document was prepared using Dragon voice recognition software and may include unintentional dictation errors.    Paulette Blanch, MD 04/08/16 [phone]

## 2016-04-08 NOTE — ED Notes (Signed)
Discharge instructions reviewed with patient. Patient verbalized understanding. Patient ambulated to lobby without difficulty.   

## 2016-04-13 ENCOUNTER — Ambulatory Visit: Payer: Medicare Other | Admitting: Anesthesiology

## 2016-04-13 ENCOUNTER — Encounter
Admission: RE | Disposition: A | Discharge status: Home / Self Care | Payer: Self-pay | Source: Ambulatory Visit | Attending: Unknown Physician Specialty

## 2016-04-13 ENCOUNTER — Encounter: Payer: Self-pay | Admitting: *Deleted

## 2016-04-13 ENCOUNTER — Ambulatory Visit
Admission: RE | Admit: 2016-04-13 | Discharge: 2016-04-13 | Disposition: A | Discharge status: Home / Self Care | Payer: Medicare Other | Source: Ambulatory Visit | Attending: Unknown Physician Specialty | Admitting: Unknown Physician Specialty

## 2016-04-13 DIAGNOSIS — Z820 Family history of epilepsy and other diseases of the nervous system: Secondary | ICD-10-CM | POA: Diagnosis not present

## 2016-04-13 DIAGNOSIS — Z809 Family history of malignant neoplasm, unspecified: Secondary | ICD-10-CM | POA: Diagnosis not present

## 2016-04-13 DIAGNOSIS — G4730 Sleep apnea, unspecified: Secondary | ICD-10-CM | POA: Insufficient documentation

## 2016-04-13 DIAGNOSIS — K5090 Crohn's disease, unspecified, without complications: Secondary | ICD-10-CM | POA: Insufficient documentation

## 2016-04-13 DIAGNOSIS — Z9049 Acquired absence of other specified parts of digestive tract: Secondary | ICD-10-CM | POA: Insufficient documentation

## 2016-04-13 DIAGNOSIS — Z800 Family history of malignant neoplasm of digestive organs: Secondary | ICD-10-CM | POA: Insufficient documentation

## 2016-04-13 DIAGNOSIS — Z825 Family history of asthma and other chronic lower respiratory diseases: Secondary | ICD-10-CM | POA: Diagnosis not present

## 2016-04-13 DIAGNOSIS — Z87442 Personal history of urinary calculi: Secondary | ICD-10-CM | POA: Diagnosis not present

## 2016-04-13 DIAGNOSIS — G43909 Migraine, unspecified, not intractable, without status migrainosus: Secondary | ICD-10-CM | POA: Insufficient documentation

## 2016-04-13 DIAGNOSIS — Z9660 Presence of unspecified orthopedic joint implant: Secondary | ICD-10-CM | POA: Diagnosis not present

## 2016-04-13 DIAGNOSIS — Z888 Allergy status to other drugs, medicaments and biological substances status: Secondary | ICD-10-CM | POA: Insufficient documentation

## 2016-04-13 DIAGNOSIS — M7521 Bicipital tendinitis, right shoulder: Secondary | ICD-10-CM | POA: Diagnosis not present

## 2016-04-13 DIAGNOSIS — Z7982 Long term (current) use of aspirin: Secondary | ICD-10-CM | POA: Insufficient documentation

## 2016-04-13 DIAGNOSIS — M75121 Complete rotator cuff tear or rupture of right shoulder, not specified as traumatic: Secondary | ICD-10-CM | POA: Insufficient documentation

## 2016-04-13 DIAGNOSIS — Z79899 Other long term (current) drug therapy: Secondary | ICD-10-CM | POA: Insufficient documentation

## 2016-04-13 DIAGNOSIS — Z833 Family history of diabetes mellitus: Secondary | ICD-10-CM | POA: Insufficient documentation

## 2016-04-13 DIAGNOSIS — Z90710 Acquired absence of both cervix and uterus: Secondary | ICD-10-CM | POA: Diagnosis not present

## 2016-04-13 DIAGNOSIS — D649 Anemia, unspecified: Secondary | ICD-10-CM | POA: Insufficient documentation

## 2016-04-13 DIAGNOSIS — K219 Gastro-esophageal reflux disease without esophagitis: Secondary | ICD-10-CM | POA: Diagnosis not present

## 2016-04-13 DIAGNOSIS — M7541 Impingement syndrome of right shoulder: Secondary | ICD-10-CM | POA: Insufficient documentation

## 2016-04-13 DIAGNOSIS — Z885 Allergy status to narcotic agent status: Secondary | ICD-10-CM | POA: Insufficient documentation

## 2016-04-13 DIAGNOSIS — Z881 Allergy status to other antibiotic agents status: Secondary | ICD-10-CM | POA: Insufficient documentation

## 2016-04-13 DIAGNOSIS — M810 Age-related osteoporosis without current pathological fracture: Secondary | ICD-10-CM | POA: Insufficient documentation

## 2016-04-13 DIAGNOSIS — Z8249 Family history of ischemic heart disease and other diseases of the circulatory system: Secondary | ICD-10-CM | POA: Insufficient documentation

## 2016-04-13 DIAGNOSIS — Z840 Family history of diseases of the skin and subcutaneous tissue: Secondary | ICD-10-CM | POA: Insufficient documentation

## 2016-04-13 DIAGNOSIS — Z882 Allergy status to sulfonamides status: Secondary | ICD-10-CM | POA: Insufficient documentation

## 2016-04-13 DIAGNOSIS — M069 Rheumatoid arthritis, unspecified: Secondary | ICD-10-CM | POA: Insufficient documentation

## 2016-04-13 DIAGNOSIS — R251 Tremor, unspecified: Secondary | ICD-10-CM | POA: Insufficient documentation

## 2016-04-13 DIAGNOSIS — Z832 Family history of diseases of the blood and blood-forming organs and certain disorders involving the immune mechanism: Secondary | ICD-10-CM | POA: Diagnosis not present

## 2016-04-13 HISTORY — DX: Failed or difficult intubation, initial encounter: T88.4XXA

## 2016-04-13 HISTORY — PX: SHOULDER ARTHROSCOPY WITH ROTATOR CUFF REPAIR AND SUBACROMIAL DECOMPRESSION: SHX5686

## 2016-04-13 SURGERY — SHOULDER ARTHROSCOPY WITH ROTATOR CUFF REPAIR AND SUBACROMIAL DECOMPRESSION
Anesthesia: General | Laterality: Right

## 2016-04-13 MED ORDER — FENTANYL CITRATE (PF) 100 MCG/2ML IJ SOLN
INTRAMUSCULAR | Status: AC
  Filled 2016-04-13: qty 2

## 2016-04-13 MED ORDER — PROPOFOL 10 MG/ML IV BOLUS
INTRAVENOUS | Status: DC | PRN
  Administered 2016-04-13: 200 mg via INTRAVENOUS

## 2016-04-13 MED ORDER — LIDOCAINE HCL (PF) 4 % IJ SOLN
INTRAMUSCULAR | Status: DC | PRN
  Administered 2016-04-13: 4 mL via RESPIRATORY_TRACT

## 2016-04-13 MED ORDER — EPINEPHRINE HCL 1 MG/ML IJ SOLN
INTRAMUSCULAR | Status: DC | PRN
  Administered 2016-04-13: 4 mL

## 2016-04-13 MED ORDER — MIDAZOLAM HCL 2 MG/2ML IJ SOLN
INTRAMUSCULAR | Status: DC | PRN
  Administered 2016-04-13: 2 mg via INTRAVENOUS

## 2016-04-13 MED ORDER — FENTANYL CITRATE (PF) 100 MCG/2ML IJ SOLN
INTRAMUSCULAR | Status: AC
  Administered 2016-04-13: 100 ug via INTRAVENOUS
  Filled 2016-04-13: qty 2

## 2016-04-13 MED ORDER — LACTATED RINGERS IR SOLN
Status: DC | PRN
  Administered 2016-04-13: 4

## 2016-04-13 MED ORDER — CLINDAMYCIN PHOSPHATE 600 MG/50ML IV SOLN
INTRAVENOUS | Status: DC | PRN
  Administered 2016-04-13: 600 mg via INTRAVENOUS

## 2016-04-13 MED ORDER — NEOSTIGMINE METHYLSULFATE 10 MG/10ML IV SOLN
INTRAVENOUS | Status: DC | PRN
  Administered 2016-04-13: 3 mg via INTRAVENOUS

## 2016-04-13 MED ORDER — ACETAMINOPHEN 10 MG/ML IV SOLN
INTRAVENOUS | Status: DC | PRN
  Administered 2016-04-13: 1000 mg via INTRAVENOUS

## 2016-04-13 MED ORDER — EPINEPHRINE HCL 1 MG/ML IJ SOLN
INTRAMUSCULAR | Status: AC
  Filled 2016-04-13: qty 1

## 2016-04-13 MED ORDER — HYDROMORPHONE HCL 1 MG/ML IJ SOLN: 0.2500 mg | INTRAMUSCULAR | Status: DC | PRN

## 2016-04-13 MED ORDER — FENTANYL CITRATE (PF) 100 MCG/2ML IJ SOLN: 25.0000 ug | INTRAMUSCULAR | Status: DC | PRN

## 2016-04-13 MED ORDER — MIDAZOLAM HCL 5 MG/5ML IJ SOLN
1.0000 mg | Freq: Once | INTRAMUSCULAR | Status: AC
  Administered 2016-04-13: 1 mg via INTRAVENOUS
  Filled 2016-04-13: qty 1

## 2016-04-13 MED ORDER — CLINDAMYCIN PHOSPHATE 600 MG/50ML IV SOLN
INTRAVENOUS | Status: AC
  Filled 2016-04-13: qty 50

## 2016-04-13 MED ORDER — DEXMEDETOMIDINE HCL IN NACL 200 MCG/50ML IV SOLN
INTRAVENOUS | Status: DC | PRN
  Administered 2016-04-13 (×2): 12 ug via INTRAVENOUS

## 2016-04-13 MED ORDER — DILAUDID 2 MG PO TABS: 2.0000 mg | ORAL_TABLET | Freq: Four times a day (QID) | ORAL | 0 refills | Status: DC | PRN

## 2016-04-13 MED ORDER — ACETAMINOPHEN 10 MG/ML IV SOLN
INTRAVENOUS | Status: AC
  Filled 2016-04-13: qty 100

## 2016-04-13 MED ORDER — PHENYLEPHRINE HCL 10 MG/ML IJ SOLN
INTRAMUSCULAR | Status: DC | PRN
  Administered 2016-04-13: 50 ug via INTRAVENOUS
  Administered 2016-04-13 (×2): 100 ug via INTRAVENOUS
  Administered 2016-04-13: 400 ug via INTRAVENOUS

## 2016-04-13 MED ORDER — LACTATED RINGERS IV SOLN
INTRAVENOUS | Status: DC
  Administered 2016-04-13 (×2): via INTRAVENOUS

## 2016-04-13 MED ORDER — LIDOCAINE HCL (PF) 1 % IJ SOLN
INTRAMUSCULAR | Status: DC | PRN
  Administered 2016-04-13: 1 mL via INTRADERMAL

## 2016-04-13 MED ORDER — MIDAZOLAM HCL 5 MG/5ML IJ SOLN
INTRAMUSCULAR | Status: AC
  Administered 2016-04-13: 1 mg via INTRAVENOUS
  Filled 2016-04-13: qty 5

## 2016-04-13 MED ORDER — ONDANSETRON HCL 4 MG/2ML IJ SOLN
INTRAMUSCULAR | Status: DC | PRN
  Administered 2016-04-13: 4 mg via INTRAVENOUS

## 2016-04-13 MED ORDER — LIDOCAINE HCL (CARDIAC) 20 MG/ML IV SOLN
INTRAVENOUS | Status: DC | PRN
  Administered 2016-04-13: 60 mg via INTRAVENOUS
  Administered 2016-04-13: 100 mg via INTRAVENOUS

## 2016-04-13 MED ORDER — GLYCOPYRROLATE 0.2 MG/ML IJ SOLN
INTRAMUSCULAR | Status: DC | PRN
  Administered 2016-04-13: 0.4 mg via INTRAVENOUS
  Administered 2016-04-13: 0.2 mg via INTRAVENOUS

## 2016-04-13 MED ORDER — FENTANYL CITRATE (PF) 100 MCG/2ML IJ SOLN
INTRAMUSCULAR | Status: DC | PRN
  Administered 2016-04-13: 250 ug via INTRAVENOUS

## 2016-04-13 MED ORDER — DEXAMETHASONE SODIUM PHOSPHATE 4 MG/ML IJ SOLN
INTRAMUSCULAR | Status: DC | PRN
  Administered 2016-04-13: 5 mg via INTRAVENOUS

## 2016-04-13 MED ORDER — ROCURONIUM BROMIDE 100 MG/10ML IV SOLN
INTRAVENOUS | Status: DC | PRN
  Administered 2016-04-13: 50 mg via INTRAVENOUS

## 2016-04-13 MED ORDER — FENTANYL CITRATE (PF) 100 MCG/2ML IJ SOLN
100.0000 ug | Freq: Once | INTRAMUSCULAR | Status: AC
  Administered 2016-04-13: 100 ug via INTRAVENOUS

## 2016-04-13 MED ORDER — ROPIVACAINE HCL 5 MG/ML IJ SOLN
INTRAMUSCULAR | Status: AC
  Filled 2016-04-13: qty 40

## 2016-04-13 MED ORDER — LIDOCAINE HCL (PF) 1 % IJ SOLN
INTRAMUSCULAR | Status: AC
  Filled 2016-04-13: qty 5

## 2016-04-13 MED ORDER — PHENYLEPHRINE HCL 10 MG/ML IJ SOLN
INTRAVENOUS | Status: DC | PRN
  Administered 2016-04-13: 20 ug/min via INTRAVENOUS

## 2016-04-13 MED ORDER — ROPIVACAINE HCL 5 MG/ML IJ SOLN
INTRAMUSCULAR | Status: DC | PRN
  Administered 2016-04-13 (×3): 10 mL via PERINEURAL

## 2016-04-13 SURGICAL SUPPLY — 70 items
ADAPTER IRRIG TUBE 2 SPIKE SOL (ADAPTER) ×3 IMPLANT
ANCHOR ALL-SUT Q-FIX 2.8 (Anchor) ×6 IMPLANT
ARTHROWAND PARAGON T2 (SURGICAL WAND)
BLADE ABRADER 4.5 (BLADE) ×3 IMPLANT
BLADE SHAVER 4.5X7 STR FR (MISCELLANEOUS) ×3 IMPLANT
BLADE SURG 15 STRL LF DISP TIS (BLADE) IMPLANT
BLADE SURG 15 STRL SS (BLADE)
BUR ABRADER 5.5 BLK (MISCELLANEOUS) ×1 IMPLANT
BUR BR 5.5 WIDE MOUTH (BURR) ×3 IMPLANT
BURR ABRADER 5.5 BLK (MISCELLANEOUS) ×3
CANNULA 8.5X75 THRED (CANNULA) IMPLANT
CANNULA SHAVER 8MMX76MM (CANNULA) IMPLANT
CAP LOCK ULTRA CANNULA (MISCELLANEOUS) IMPLANT
CHLORAPREP W/TINT 26ML (MISCELLANEOUS) ×6 IMPLANT
DRAPE IMP U-DRAPE 54X76 (DRAPES) ×3 IMPLANT
DRAPE STERI 35X30 U-POUCH (DRAPES) ×3 IMPLANT
ELECT REM PT RETURN 9FT ADLT (ELECTROSURGICAL) ×3
ELECTRODE REM PT RTRN 9FT ADLT (ELECTROSURGICAL) ×1 IMPLANT
GAUZE SPONGE 4X4 12PLY STRL (GAUZE/BANDAGES/DRESSINGS) ×3 IMPLANT
GLOVE BIO SURGEON STRL SZ7.5 (GLOVE) ×3 IMPLANT
GLOVE BIO SURGEON STRL SZ8 (GLOVE) ×3 IMPLANT
GLOVE INDICATOR 8.0 STRL GRN (GLOVE) ×3 IMPLANT
GOWN STRL REUS W/ TWL LRG LVL3 (GOWN DISPOSABLE) ×1 IMPLANT
GOWN STRL REUS W/TWL LRG LVL3 (GOWN DISPOSABLE) ×2
GOWN STRL REUS W/TWL LRG LVL4 (GOWN DISPOSABLE) ×3 IMPLANT
IV LACTATED RINGER IRRG 3000ML (IV SOLUTION) ×2
IV LR IRRIG 3000ML ARTHROMATIC (IV SOLUTION) ×1 IMPLANT
KIT SHOULDER TRACTION (DRAPES) ×3 IMPLANT
KIT SUTURE 2.8 Q-FIX DISP (MISCELLANEOUS) ×3 IMPLANT
MANIFOLD 4PT FOR NEPTUNE1 (MISCELLANEOUS) ×3 IMPLANT
NEEDLE 18GX1X1/2 (RX/OR ONLY) (NEEDLE) IMPLANT
NEEDLE MAYO CATGUT SZ 1.5 (NEEDLE)
NEEDLE MAYO CATGUT SZ 2 (NEEDLE) IMPLANT
NEEDLE SPNL 18GX3.5 QUINCKE PK (NEEDLE) IMPLANT
PACK ARTHROSCOPY SHOULDER (MISCELLANEOUS) ×3 IMPLANT
PASSER SUT CAPTURE FIRST (SUTURE) ×3 IMPLANT
SET TUBE SUCT SHAVER OUTFL 24K (TUBING) ×3 IMPLANT
SOL PREP PVP 2OZ (MISCELLANEOUS) ×3
SOLUTION PREP PVP 2OZ (MISCELLANEOUS) ×1 IMPLANT
STAPLER SKIN PROX 35W (STAPLE) IMPLANT
SUT ETHIBOND NAB CT1 #1 30IN (SUTURE) IMPLANT
SUT ETHILON 3-0 FS-10 30 BLK (SUTURE)
SUT PDS AB 1 CT1 27 (SUTURE) IMPLANT
SUT PROLENE 2 0 CT2 30 (SUTURE) IMPLANT
SUT SMART STITCH CARTRIDGE (SUTURE) IMPLANT
SUT TICRON 2-0 30IN 311381 (SUTURE) IMPLANT
SUT VIC AB 0 CT1 36 (SUTURE) IMPLANT
SUT VIC AB 0 CT2 27 (SUTURE) ×3 IMPLANT
SUT VIC AB 2-0 CT1 27 (SUTURE)
SUT VIC AB 2-0 CT1 TAPERPNT 27 (SUTURE) IMPLANT
SUT VIC AB 2-0 CT2 27 (SUTURE) IMPLANT
SUT VIC AB 2-0 SH 27 (SUTURE)
SUT VIC AB 2-0 SH 27XBRD (SUTURE) IMPLANT
SUT VIC AB 3-0 SH 27 (SUTURE)
SUT VIC AB 3-0 SH 27X BRD (SUTURE) IMPLANT
SUTURE EHLN 3-0 FS-10 30 BLK (SUTURE) IMPLANT
SUTURE MAGNUM WIRE 2X48 BLK (SUTURE) ×6 IMPLANT
SYRINGE 10CC LL (SYRINGE) IMPLANT
TAPE MICROFOAM 4IN (TAPE) ×3 IMPLANT
TUBING ARTHRO INFLOW-ONLY STRL (TUBING) ×3 IMPLANT
WAND 30 DEG SABER W/CORD (SURGICAL WAND) IMPLANT
WAND ARTHRO PARAGON T2 (SURGICAL WAND) IMPLANT
WAND COVAC 50 IFS (MISCELLANEOUS) IMPLANT
WAND COVATOR 20 (MISCELLANEOUS) IMPLANT
WAND HAND CNTRL MULTIVAC 50 (MISCELLANEOUS) IMPLANT
WAND HAND CNTRL MULTIVAC 90 (MISCELLANEOUS) ×3 IMPLANT
WAND TENDON TOPAZ 0 ANGL (MISCELLANEOUS) IMPLANT
WAND TOPAZ EPF  WAS Q (MISCELLANEOUS)
WAND TOPAZ EPF WAS Q (MISCELLANEOUS) IMPLANT
WIRE MAGNUM (SUTURE) ×3 IMPLANT

## 2016-04-13 NOTE — Op Note (Signed)
04/13/2016  1:44 PM  Patient:   Nicole Bass  Pre-Op Diagnosis:    COMPLETE TEAR OF RIGHT ROTATOR CUFF plus long head of the biceps tendinitis and subacromial impingement  Postoperative diagnosis: Same   Procedure: Arthroscopic subacromial decompression plus arthroscopic release of the long head of the biceps tendon followed by mini incision rotator cuff repair on the right  Anesthesia: General endotracheal with interscalene block placed preoperatively by the anesthesiologist.  Findings: As above.   Complications: None  Estimated blood loss: negligible  Tourniquet time: None  Drains: None   Brief clinical note:  The patient's symptoms have progressed despite medications, activity modification, etc. The patient's history and examination are consistent with rotator cuff tear right shoulder. These findings were confirmed by MRI scan. The patient presents at this time for definitive management of these shoulder symptoms.  Procedure: The patient had an interscalene block on the right shoulder in the preop area. The patient was then brought into the operating room and placed in the supine position. The patient then underwent  endotracheal intubation and general anesthesia before being repositioned in the lateral decubitus position. The right shoulder was prepped and draped in usual fashion for shoulder procedure. The shoulder was supported with the Acufex shoulder suspension device.  10 pounds of traction was utilized. Preoperative antibiotics were administered. A timeout was performed . A posterior portal was created and the glenohumeral joint thoroughly inspected revealing a frayed biceps tendon along with some grade 3 humeral head chondral changes. An anterior portal was created. An ArthroCare wand was inserted and used to obtain hemostasis as well as to perform a limited synovectomy.The biceps tendon was evaluated and then released from its labral attachment using an  ArthroCare wand.  The scope was repositioned through the posterior portal into the subacromial space. A separate lateral portal was created using an outside-in technique. The rotator cuff was inspected revealing a moderate sized tear. An ArthroCare 90 wand followed by a 4.0 full-radius resector was introduced and used to perform a subtotal bursectomy. The ArthroCare wand was then inserted and used to remove the periosteal tissue off the undersurface of the anterior third of the acromion as well as to recess the coracoacromial ligament from its attachment along the anterior and lateral margins of the acromion.   With the scope in the lateral portal a 5.93m acromionizing burr was introduced through the posterior portal and used to perform the decompression by removing the undersurface of the anterior third of the acromion. I also used a 5.5 mm round burr to lightly decorticate the greater tuberosity in the intended area of the rotator rotator cuff reattachment. I also used a small punch to make vascular vent holes in the greater tuberosity. The instruments were then removed from the subacromial space.  An approximately 4 cm incision was made over the midlateral aspect of the shoulder.   This incision was carried down through the subcutaneous tissues onto the deltoid. The deltoid was divided in line with its fibers to provide access into the subacromial space. The rotator cuff tear was readily identified. The margins were debrided.   The tear was repaired using horizontal mattress sutures secured to two Q- Fix anchors. The horizontal mattress suture tails were then crisscrossed over to 2 laterally placed 5.5 speed screws. This gave me a 2 row repair of the torn rotator cuff. The repaired cuff seemed to be  quite stable. I did place a simple #2 nonabsorbable suture into a small residual posterior extension of  the tear.   The wound was copiously irrigated with sterile saline solution before the deltoid was  repaired to bone with #1 ethibond suture.  Deltoid interval was closed with 0 vicryl. The subcutaneous tissues were closed  using 2-0 Vicryl interrupted sutures and the skin incision was closed using staples. The portal sites also were closed using 3-O nylon sutures. A sterile bulky dressing was applied to the shoulder. The right upper extremity was placed into a shoulder immobilizer. The patient was then awakened, extubated, and returned to the recovery room in satisfactory condition after tolerating the procedure well.  Blood loss was negligible.

## 2016-04-13 NOTE — Transfer of Care (Signed)
Immediate Anesthesia Transfer of Care Note  Patient: Nicole Bass  Procedure(s) Performed: Procedure(s): SHOULDER ARTHROSCOPY WITH ROTATOR CUFF REPAIR AND SUBACROMIAL DECOMPRESSION, release of long head biceps tendon (Right)  Patient Location: PACU  Anesthesia Type:General  Level of Consciousness: sedated  Airway & Oxygen Therapy: Patient Spontanous Breathing and Patient connected to nasal cannula oxygen  Post-op Assessment: Report given to RN and Post -op Vital signs reviewed and stable  Post vital signs: Reviewed and stable  Last Vitals:  Vitals:   04/13/16 0835 04/13/16 1305  BP: 136/81 103/62  Pulse: 96 89  Resp: 18 14  Temp: 36.3 C (!) 36.1 C    Last Pain:  Vitals:   04/13/16 0835  TempSrc: Tympanic         Complications: No apparent anesthesia complications

## 2016-04-13 NOTE — Anesthesia Procedure Notes (Signed)
Anesthesia Regional Block:  Interscalene brachial plexus block  Pre-Anesthetic Checklist: ,, timeout performed, Correct Patient, Correct Site, Correct Laterality, Correct Procedure, Correct Position, site marked, Risks and benefits discussed,  Surgical consent,  Pre-op evaluation,  At surgeon's request and post-op pain management  Laterality: Upper and Right  Prep: chloraprep       Needles:  Injection technique: Single-shot  Needle Type: Stimiplex     Needle Length: 5cm 5 cm Needle Gauge: 22 and 22 G    Additional Needles:  Procedures: ultrasound guided (picture in chart) Interscalene brachial plexus block Narrative:  Start time: 04/13/2016 10:00 AM End time: 04/13/2016 10:03 AM Injection made incrementally with aspirations every 5 mL.  Performed by: Personally  Anesthesiologist: Katy Fitch K  Additional Notes: Patient endorses baseline weakness and numbness in operative shoulder and arm  Functioning IV was confirmed and monitors were applied.  A 13m 22ga Stimuplex needle was used. Sterile prep,hand hygiene and sterile gloves were used.  Negative aspiration and negative test dose prior to incremental administration of local anesthetic. The patient tolerated the procedure well with no immediate complications.

## 2016-04-13 NOTE — H&P (Signed)
  H and P reviewed. No changes. Uploaded at later date.

## 2016-04-13 NOTE — Anesthesia Procedure Notes (Signed)
Procedure Name: Intubation Date/Time: 04/13/2016 10:28 AM Performed by: Rosaria Ferries, Quentyn Kolbeck Pre-anesthesia Checklist: Patient identified, Emergency Drugs available, Suction available and Patient being monitored Patient Re-evaluated:Patient Re-evaluated prior to inductionOxygen Delivery Method: Circle system utilized Preoxygenation: Pre-oxygenation with 100% oxygen Intubation Type: IV induction Laryngoscope Size: Mac and 3 Grade View: Grade III Tube size: 7.0 mm Number of attempts: 1 Placement Confirmation: ETT inserted through vocal cords under direct vision,  positive ETCO2 and breath sounds checked- equal and bilateral Secured at: 21 cm Tube secured with: Tape Dental Injury: Teeth and Oropharynx as per pre-operative assessment

## 2016-04-13 NOTE — Discharge Instructions (Signed)
Use shoulder immobilizer at all times  Keep dressing dry  Leave dressing in place until first postoperative visit  Return to the clinic about 1 week post surgery  Take 81 mg aspirin or Bufferin tablet twice a day for 2 weeks post surgery  Can sleep with multiple pillows behind the back or in a recliner  Use TENS unit if prescribed  Take pain medication prior to going to sleep the evening of your surgery  Ice pack prn  Activity as tolerated

## 2016-04-13 NOTE — OR Nursing (Signed)
To PACU via stretcher for block - report given to [facility]

## 2016-04-13 NOTE — Anesthesia Preprocedure Evaluation (Signed)
Anesthesia Evaluation  Patient identified by MRN, date of birth, ID band Patient awake    Reviewed: Allergy & Precautions, H&P , NPO status , Patient's Chart, lab work & pertinent test results  History of Anesthesia Complications Negative for: history of anesthetic complications  Airway Mallampati: III  TM Distance: <3 FB Neck ROM: limited    Dental no notable dental hx. (+) Poor Dentition, Chipped, Upper Dentures   Pulmonary neg pulmonary ROS, neg shortness of breath,    Pulmonary exam normal breath sounds clear to auscultation       Cardiovascular Exercise Tolerance: Good (-) angina(-) Past MI and (-) DOE negative cardio ROS Normal cardiovascular exam Rhythm:regular Rate:Normal     Neuro/Psych negative neurological ROS  negative psych ROS   GI/Hepatic negative GI ROS, Neg liver ROS,   Endo/Other  negative endocrine ROS  Renal/GU      Musculoskeletal  (+) Arthritis ,   Abdominal   Peds  Hematology negative hematology ROS (+)   Anesthesia Other Findings Patient endorses baseline weakness and numbness in operative shoulder and arm.  Past Medical History: No date: Anemia No date: Arthritis No date: Collagen vascular disease ([facility]) No date: Crohn's disease ([facility]) No date: DDD (degenerative disc disease), cervical No date: DDD (degenerative disc disease), cervical 2003: DDD (degenerative disc disease), cervical No date: DDD (degenerative disc disease), lumbar No date: Degenerative disc disease, cervical  Past Surgical History: No date: ABDOMINAL HYSTERECTOMY No date: APPENDECTOMY No date: BACK SURGERY     Comment: x 3 1976 & 1989: CESAREAN SECTION No date: CHOLECYSTECTOMY 1999: COLON SURGERY     Comment: removed 18 inches of colon 2004: [place] 2008: ELBOW FRACTURE SURGERY 2015: JOINT REPLACEMENT Right     Comment: partial knee replacement No date: KYPHOPLASTY 04/14/2015:  KYPHOPLASTY N/A     Comment: Procedure: KYPHOPLASTY;  Surgeon: Hessie Knows, MD;  Location: [facility] ORS;  Service:               Orthopedics;  Laterality: N/A; 06/04/2015: KYPHOPLASTY N/A     Comment: Procedure: KYPHOPLASTY L-5;  Surgeon: Hessie Knows, MD;  Location: [facility] ORS;  Service:               Orthopedics;  Laterality: N/A; No date: MEDIAL PARTIAL KNEE REPLACEMENT No date: OOPHORECTOMY Bilateral 2013: TOE SURGERY No date: TONSILLECTOMY No date: WRIST FRACTURE SURGERY Bilateral  BMI    Body Mass Index:  31.35 kg/m      Reproductive/Obstetrics negative OB ROS                             Anesthesia Physical Anesthesia Plan  ASA: III  Anesthesia Plan: General ETT   Post-op Pain Management:  Regional for Post-op pain   Induction:   Airway Management Planned:   Additional Equipment:   Intra-op Plan:   Post-operative Plan:   Informed Consent: I have reviewed the patients History and Physical, chart, labs and discussed the procedure including the risks, benefits and alternatives for the proposed anesthesia with the patient or authorized representative who has indicated his/her understanding and acceptance.     Plan Discussed with: Anesthesiologist, CRNA and Surgeon  Anesthesia Plan Comments:         Anesthesia Quick Evaluation

## 2016-04-14 ENCOUNTER — Encounter: Payer: Self-pay | Admitting: Unknown Physician Specialty

## 2016-04-15 NOTE — Anesthesia Postprocedure Evaluation (Signed)
Anesthesia Post Note  Patient: Nicole Bass  Procedure(s) Performed: Procedure(s) (LRB): SHOULDER ARTHROSCOPY WITH ROTATOR CUFF REPAIR AND SUBACROMIAL DECOMPRESSION, release of long head biceps tendon (Right)  Patient location during evaluation: PACU Anesthesia Type: General Level of consciousness: awake and alert Pain management: pain level controlled Vital Signs Assessment: post-procedure vital signs reviewed and stable Respiratory status: spontaneous breathing, nonlabored ventilation, respiratory function stable and patient connected to nasal cannula oxygen Cardiovascular status: blood pressure returned to baseline and stable Postop Assessment: no signs of nausea or vomiting Anesthetic complications: no    Last Vitals:  Vitals:   04/13/16 1420 04/13/16 1435  BP: 113/68 (!) 112/51  Pulse: 89 77  Resp: 16 16  Temp: 36.3 C 36.5 C    Last Pain:  Vitals:   04/14/16 0859  TempSrc:   PainSc: 7                  Precious Haws [place]

## 2016-05-17 ENCOUNTER — Ambulatory Visit
Admission: EM | Admit: 2016-05-17 | Discharge: 2016-05-17 | Disposition: A | Discharge status: Home / Self Care | Payer: Medicare Other | Attending: Family Medicine | Admitting: Family Medicine

## 2016-05-17 ENCOUNTER — Encounter: Payer: Self-pay | Admitting: *Deleted

## 2016-05-17 DIAGNOSIS — R110 Nausea: Secondary | ICD-10-CM | POA: Diagnosis not present

## 2016-05-17 DIAGNOSIS — T50905A Adverse effect of unspecified drugs, medicaments and biological substances, initial encounter: Secondary | ICD-10-CM

## 2016-05-17 DIAGNOSIS — T887XXA Unspecified adverse effect of drug or medicament, initial encounter: Secondary | ICD-10-CM

## 2016-05-17 LAB — CBC WITH DIFFERENTIAL/PLATELET
Basophils Absolute: 0.1 10*3/uL (ref 0–0.1)
Basophils Relative: 1 %
Eosinophils Absolute: 0.2 10*3/uL (ref 0–0.7)
Eosinophils Relative: 1 %
HCT: 31.7 % — ABNORMAL LOW (ref 35.0–47.0)
Hemoglobin: 10.6 g/dL — ABNORMAL LOW (ref 12.0–16.0)
Lymphocytes Relative: 14 %
Lymphs Abs: 1.7 10*3/uL (ref 1.0–3.6)
MCH: 31.2 pg (ref 26.0–34.0)
MCHC: 33.5 g/dL (ref 32.0–36.0)
MCV: 93 fL (ref 80.0–100.0)
Monocytes Absolute: 1 10*3/uL — ABNORMAL HIGH (ref 0.2–0.9)
Monocytes Relative: 8 %
Neutro Abs: 9 10*3/uL — ABNORMAL HIGH (ref 1.4–6.5)
Neutrophils Relative %: 76 %
Platelets: 309 10*3/uL (ref 150–440)
RBC: 3.41 MIL/uL — ABNORMAL LOW (ref 3.80–5.20)
RDW: 13 % (ref 11.5–14.5)
WBC: 11.9 10*3/uL — ABNORMAL HIGH (ref 3.6–11.0)

## 2016-05-17 LAB — COMPREHENSIVE METABOLIC PANEL
ALT: 14 U/L (ref 14–54)
AST: 28 U/L (ref 15–41)
Albumin: 3.3 g/dL — ABNORMAL LOW (ref 3.5–5.0)
Alkaline Phosphatase: 94 U/L (ref 38–126)
Anion gap: 10 (ref 5–15)
BUN: 9 mg/dL (ref 6–20)
CO2: 22 mmol/L (ref 22–32)
Calcium: 8.8 mg/dL — ABNORMAL LOW (ref 8.9–10.3)
Chloride: 106 mmol/L (ref 101–111)
Creatinine, Ser: 0.68 mg/dL (ref 0.44–1.00)
GFR calc Af Amer: >60 mL/min (ref >60)
GFR calc non Af Amer: >60 mL/min (ref >60)
Glucose, Bld: 123 mg/dL — ABNORMAL HIGH (ref 65–99)
Potassium: 3.5 mmol/L (ref 3.5–5.1)
Sodium: 138 mmol/L (ref 135–145)
Total Bilirubin: 0.4 mg/dL (ref 0.3–1.2)
Total Protein: 7 g/dL (ref 6.5–8.1)

## 2016-05-17 LAB — URINALYSIS COMPLETE WITH MICROSCOPIC (ARMC ONLY)
Bacteria, UA: NONE SEEN
Bilirubin Urine: NEGATIVE
Glucose, UA: NEGATIVE mg/dL
Hgb urine dipstick: NEGATIVE
Ketones, ur: NEGATIVE mg/dL
Leukocytes, UA: NEGATIVE
Nitrite: NEGATIVE
Protein, ur: NEGATIVE mg/dL
Specific Gravity, Urine: 1.015 (ref 1.005–1.030)
pH: 6.5 (ref 5.0–8.0)

## 2016-05-17 LAB — RAPID INFLUENZA A&B ANTIGENS
Influenza A (ARMC): NEGATIVE
Influenza B (ARMC): NEGATIVE

## 2016-05-17 LAB — LIPASE, BLOOD: Lipase: 56 U/L — ABNORMAL HIGH (ref 11–51)

## 2016-05-17 LAB — RAPID STREP SCREEN (MED CTR MEBANE ONLY): Streptococcus, Group A Screen (Direct): NEGATIVE

## 2016-05-17 MED ORDER — ONDANSETRON 8 MG PO TBDP: 8.0000 mg | ORAL_TABLET | Freq: Two times a day (BID) | ORAL | 0 refills | Status: DC

## 2016-05-17 MED ORDER — ONDANSETRON 8 MG PO TBDP
8.0000 mg | ORAL_TABLET | Freq: Once | ORAL | Status: AC
  Administered 2016-05-17: 8 mg via ORAL

## 2016-05-17 MED ORDER — KETOROLAC TROMETHAMINE 60 MG/2ML IM SOLN
30.0000 mg | Freq: Once | INTRAMUSCULAR | Status: AC
  Administered 2016-05-17: 30 mg via INTRAMUSCULAR

## 2016-05-17 NOTE — ED Provider Notes (Signed)
CSN: [id number]     Arrival date & time 05/17/16  1713 History   First MD Initiated Contact with Patient 05/17/16 1754     Chief Complaint  Patient presents with  . Chills  . Dizziness  . Nausea   (Consider location/radiation/quality/duration/timing/severity/associated sxs/prior Treatment) HPI  65 year old female who presents with chills dizziness and fatigue started 2-3 weeks ago. She was seen at the [facility] walk-in clinic on 06/05/2016 diagnosed with dehydration  a taken off her oxycodone she was using for shoulder pain after having undergone shoulder surgery. He was placed on tramadol that time and she stated that she had 4 days of feeling much better only to return for the last 7 days. Tried to get into the clinic again today but was unable to secure an appointment came here. Concerned about having the flu though she's been afebrile has been having some chills. She has not had any cough. She has been complaining of abdominal pain has a history of Crohn's disease. She states that she took some Carafate has eased her belly pain. She had a normal bowel movement this morning. She vomited once in our office after a swab was used to obtain a throat culture. She had been nauseated prior to that.       Past Medical History:  Diagnosis Date  . Anemia   . Arthritis   . Collagen vascular disease ([place])   . Crohn's disease ([place])   . DDD (degenerative disc disease), cervical   . DDD (degenerative disc disease), cervical   . DDD (degenerative disc disease), cervical 2003  . DDD (degenerative disc disease), lumbar   . Degenerative disc disease, cervical   . Difficult intubation    Past Surgical History:  Procedure Laterality Date  . ABDOMINAL HYSTERECTOMY    . APPENDECTOMY    . BACK SURGERY     x 3  . [place]  . CHOLECYSTECTOMY    . COLON SURGERY  1999   removed 18 inches of colon  . DILATION AND CURETTAGE OF UTERUS  2004  . ELBOW FRACTURE SURGERY  2008  . JOINT  REPLACEMENT Right 2015   partial knee replacement  . KYPHOPLASTY    . KYPHOPLASTY N/A 04/14/2015   Procedure: KYPHOPLASTY;  Surgeon: Hessie Knows, MD;  Location: [facility] ORS;  Service: Orthopedics;  Laterality: N/A;  . KYPHOPLASTY N/A 06/04/2015   Procedure: KYPHOPLASTY L-5;  Surgeon: Hessie Knows, MD;  Location: [facility] ORS;  Service: Orthopedics;  Laterality: N/A;  . MEDIAL PARTIAL KNEE REPLACEMENT    . OOPHORECTOMY Bilateral   . SHOULDER ARTHROSCOPY WITH ROTATOR CUFF REPAIR AND SUBACROMIAL DECOMPRESSION Right 04/13/2016   Procedure: SHOULDER ARTHROSCOPY WITH ROTATOR CUFF REPAIR AND SUBACROMIAL DECOMPRESSION, release of long head biceps tendon;  Surgeon: Leanor Kail, MD;  Location: [facility] ORS;  Service: Orthopedics;  Laterality: Right;  . TOE SURGERY  2013  . TONSILLECTOMY    . WRIST FRACTURE SURGERY Bilateral    Family History  Problem Relation Age of Onset  . Diabetes Mother   . Emphysema Mother   . Emphysema Father   . Breast cancer Neg Hx    Social History  Substance Use Topics  . Smoking status: Never Smoker  . Smokeless tobacco: Never Used  . Alcohol use No   OB History    No data available     Review of Systems  Constitutional: Positive for activity change, appetite change, chills and fatigue. Negative for diaphoresis and fever.  Respiratory: Negative for  cough.   Gastrointestinal: Positive for abdominal distention, abdominal pain, nausea and vomiting. Negative for anal bleeding, blood in stool, constipation and diarrhea.  All other systems reviewed and are negative.   Allergies  Sulfa antibiotics; Cefazolin; Cephalosporins; Enbrel [etanercept]; Humira [adalimumab]; Levaquin [levofloxacin in d5w]; Lorabid [loracarbef]; Meropenem; Norco [hydrocodone-acetaminophen]; and Primidone  Home Medications   Prior to Admission medications   Medication Sig Start Date End Date Taking? Authorizing Provider  aspirin EC 81 MG tablet Take 81 mg by mouth daily.   Yes Historical  Provider, MD  Biotin 1000 MCG tablet Take 1,000 mcg by mouth daily.   Yes Historical Provider, MD  Calcium-Vitamin D-Vitamin K (VIACTIV PO) Take 2 Doses by mouth every evening.   Yes Historical Provider, MD  cetirizine (ZYRTEC) 10 MG tablet Take 10 mg by mouth daily.   Yes Historical Provider, MD  clonazePAM (KLONOPIN) 1 MG tablet Take 1 mg by mouth at bedtime as needed (for sleep).    Yes Historical Provider, MD  cyanocobalamin (,VITAMIN B-12,) 1000 MCG/ML injection Inject 1,000 mcg into the muscle every 30 (thirty) days.   Yes Historical Provider, MD  Cyanocobalamin (VITAMIN B-12) 6000 MCG SUBL Place 6,000 mcg under the tongue daily.   Yes Historical Provider, MD  diphenoxylate-atropine (LOMOTIL) 2.5-0.025 MG per tablet Take 2 tablets by mouth every morning.    Yes Historical Provider, MD  folic acid (FOLVITE) 1 MG tablet Take 1 mg by mouth daily.   Yes Historical Provider, MD  furosemide (LASIX) 20 MG tablet Take 20 mg by mouth every morning.    Yes Historical Provider, MD  magnesium oxide (MAG-OX) 400 MG tablet Take 400 mg by mouth daily.    Yes Historical Provider, MD  METHOTREXATE SODIUM IJ Inject 8 mg as directed once a week. Subcutaneous on Sundays   Yes Historical Provider, MD  Multiple Vitamins-Minerals (MULTIVITAMIN WITH MINERALS) tablet Take 1 tablet by mouth daily.   Yes Historical Provider, MD  omeprazole (PRILOSEC) 40 MG capsule Take 40 mg by mouth every morning.    Yes Historical Provider, MD  potassium chloride SA (K-DUR,KLOR-CON) 20 MEQ tablet Take 20 mEq by mouth daily.    Yes Historical Provider, MD  Probiotic Product (PROBIOTIC PO) Take 2 tablets by mouth every morning.   Yes Historical Provider, MD  rOPINIRole (REQUIP) 0.5 MG tablet Take 0.5 mg by mouth at bedtime as needed (for restless legs).    Yes Historical Provider, MD  sucralfate (CARAFATE) 1 g tablet Take 1 g by mouth 3 (three) times daily with meals as needed (for stomach cramping).   Yes Historical Provider, MD   tapentadol (NUCYNTA) 50 MG tablet Take 50 mg by mouth every 6 (six) hours as needed.   Yes Historical Provider, MD  ondansetron (ZOFRAN ODT) 8 MG disintegrating tablet Take 1 tablet (8 mg total) by mouth 2 (two) times daily. 05/17/16   Lorin Picket, PA-C   Meds Ordered and Administered this Visit   Medications  ondansetron (ZOFRAN-ODT) disintegrating tablet 8 mg (8 mg Oral Given 05/17/16 1742)  ketorolac (TORADOL) injection 30 mg (30 mg Intramuscular Given 05/17/16 1932)    BP 127/69 (BP Location: Left Arm)   Pulse 97   Temp 98.3 F (36.8 C) (Oral)   Resp 20   Ht 5' 3"  (1.6 m)   Wt 180 lb (81.6 kg)   SpO2 98%   BMI 31.89 kg/m  Orthostatic VS for the past 24 hrs:  BP- Lying Pulse- Lying BP- Sitting Pulse- Sitting BP- Standing  at 0 minutes Pulse- Standing at 0 minutes  05/17/16 1800 113/61 96 121/57 99 112/65 100    Physical Exam  Constitutional: She is oriented to person, place, and time. She appears well-developed and well-nourished. No distress.  HENT:  Head: Normocephalic and atraumatic.  Right Ear: External ear normal.  Left Ear: External ear normal.  Nose: Nose normal.  Mouth/Throat: Oropharynx is clear and moist. No oropharyngeal exudate.  Vision is bilateral dullness behind her eardrums  Eyes: EOM are normal. Pupils are equal, round, and reactive to light. Right eye exhibits no discharge. Left eye exhibits no discharge.  Neck: Normal range of motion. Neck supple.  Pulmonary/Chest: Effort normal and breath sounds normal. No respiratory distress. She has no wheezes. She has no rales.  Abdominal: Soft. Bowel sounds are normal. She exhibits distension. She exhibits no mass. There is tenderness. There is no rebound and no guarding.  EXAMINATION of the abdomen shows mild distention. She has diffuse tenderness of the right quadrant and up into the right epigastric area. She is no rebound and no guarding. She has some mild tenderness periumbilical.  Musculoskeletal: Normal  range of motion.  Lymphadenopathy:    She has no cervical adenopathy.  Neurological: She is alert and oriented to person, place, and time.  Skin: Skin is warm and dry. She is not diaphoretic.  Psychiatric: She has a normal mood and affect. Her behavior is normal. Judgment and thought content normal.  Nursing note and vitals reviewed.   Urgent Care Course   Clinical Course    Procedures (including critical care time)  Labs Review Labs Reviewed  CBC WITH DIFFERENTIAL/PLATELET - Abnormal; Notable for the following:       Result Value   WBC 11.9 (*)    RBC 3.41 (*)    Hemoglobin 10.6 (*)    HCT 31.7 (*)    Neutro Abs 9.0 (*)    Monocytes Absolute 1.0 (*)    All other components within normal limits  LIPASE, BLOOD - Abnormal; Notable for the following:    Lipase 56 (*)    All other components within normal limits  COMPREHENSIVE METABOLIC PANEL - Abnormal; Notable for the following:    Glucose, Bld 123 (*)    Calcium 8.8 (*)    Albumin 3.3 (*)    All other components within normal limits  URINALYSIS COMPLETEWITH MICROSCOPIC ([facility] ONLY) - Abnormal; Notable for the following:    Squamous Epithelial / LPF 0-5 (*)    All other components within normal limits  RAPID STREP SCREEN (NOT AT [facility])  RAPID INFLUENZA A&B ANTIGENS ([facility] ONLY)  CULTURE, GROUP A STREP [facility])    Imaging Review No results found.   Visual Acuity Review  Right Eye Distance:   Left Eye Distance:   Bilateral Distance:    Right Eye Near:   Left Eye Near:    Bilateral Near:     Medications  ondansetron (ZOFRAN-ODT) disintegrating tablet 8 mg (8 mg Oral Given 05/17/16 1742)  ketorolac (TORADOL) injection 30 mg (30 mg Intramuscular Given 05/17/16 1932)      MDM   1. Nausea   2. Medication reaction, initial encounter    Discharge Medication List as of 05/17/2016  7:38 PM    START taking these medications   Details  ondansetron (ZOFRAN ODT) 8 MG disintegrating tablet Take 1 tablet (8 mg total) by  mouth 2 (two) times daily., Starting Tue 05/17/2016, Normal      Plan: 1. Test/x-ray results and diagnosis reviewed  with patient 2. rx as per orders; risks, benefits, potential side effects reviewed with patient 3. Recommend supportive treatment with Good hydration. Told her that she should come off of all of her narcotic medications until she can contact her orthopedic surgeon for further instructions.I accessed the [place] substance abuse website and she had some 150 pills of narcotics from hydromorphone through oxycodone and most recently with tramadol. She states that she has been using less of the tramadol but continues to use it. Some of her symptoms may be coming from an adverse reaction to the tramadol that she may also have some  withdrawal from the narcotics. In any event she needs to contact her physicians to let them know of her visit here and her symptoms. She'll need closer follow-up with the narcotic medications. She is continuing to complain of shoulder pain and was given 30 mg of Toradol injection here. Continue with her Carafate using it twice a day.    Lorin Picket, PA-C 05/17/16 1947

## 2016-05-17 NOTE — ED Triage Notes (Signed)
C/o chills, dizziness, fatigue, onset 2-3 weeks ago. States just feel bad, concerned about having the flu. S/P recent right shoulder surgery.

## 2016-05-20 LAB — CULTURE, GROUP A STREP (THRC)

## 2016-05-21 ENCOUNTER — Telehealth: Payer: Self-pay | Admitting: Emergency Medicine

## 2016-05-21 NOTE — Telephone Encounter (Signed)
Patient notified that her throat culture was Negative.

## 2016-08-15 ENCOUNTER — Ambulatory Visit
Admission: EM | Admit: 2016-08-15 | Discharge: 2016-08-15 | Disposition: A | Discharge status: Home / Self Care | Payer: Medicare Other | Attending: Emergency Medicine | Admitting: Emergency Medicine

## 2016-08-15 DIAGNOSIS — J111 Influenza due to unidentified influenza virus with other respiratory manifestations: Secondary | ICD-10-CM | POA: Diagnosis not present

## 2016-08-15 LAB — RAPID INFLUENZA A&B ANTIGENS: Influenza B (ARMC): NEGATIVE

## 2016-08-15 LAB — RAPID INFLUENZA A&B ANTIGENS (ARMC ONLY): INFLUENZA A (ARMC): NEGATIVE

## 2016-08-15 LAB — GLUCOSE, CAPILLARY: Glucose-Capillary: 120 mg/dL — ABNORMAL HIGH (ref 65–99)

## 2016-08-15 MED ORDER — OSELTAMIVIR PHOSPHATE 75 MG PO CAPS: 75.0000 mg | ORAL_CAPSULE | Freq: Two times a day (BID) | ORAL | 0 refills | Status: AC

## 2016-08-15 MED ORDER — ONDANSETRON 8 MG PO TBDP
8.0000 mg | ORAL_TABLET | Freq: Once | ORAL | Status: AC
  Administered 2016-08-15: 8 mg via ORAL

## 2016-08-15 MED ORDER — ONDANSETRON 8 MG PO TBDP: 8.0000 mg | ORAL_TABLET | Freq: Three times a day (TID) | ORAL | 0 refills | Status: AC | PRN

## 2016-08-15 NOTE — ED Provider Notes (Signed)
CSN: [id number]     Arrival date & time 08/15/16  0941 History   First MD Initiated Contact with Patient 08/15/16 1010     Chief Complaint  Patient presents with  . Emesis   (Consider location/radiation/quality/duration/timing/severity/associated sxs/prior Treatment) Patient is a 66 year old female, with history of osteoarthritis, chrohn's and anemia, started getting sick last night with a headache of 4 out of 10. This morning, she woke up feeling nauseous and had 4 episodes of emesis this morning. While waiting in the lobby to be seen, patient felt dizzy and had to lay down. Emesis consist mainly mucus, denies vomiting as projectile. Denies hematemesis. Patient does endorses some abdominal discomfort and nausea. Patient also had a few diarrhea today. She also reports body ache and some nasal congestion. She is afebrile at home; no fever today as well.   Patient denies shortness of breath or chest pain. Patient has a history of Crohn, disease and states that it does not feel like her typical chrohn's flare up. She also endorses body aches and some nasal congestion.   Husband is also sick with URI symptoms, headache and body aches; husband have been sick for 10 days. Husband also checked in today to be evaluated.        Past Medical History:  Diagnosis Date  . Anemia   . Arthritis   . Collagen vascular disease ([place])   . Crohn's disease ([place])   . DDD (degenerative disc disease), cervical   . DDD (degenerative disc disease), cervical   . DDD (degenerative disc disease), cervical 2003  . DDD (degenerative disc disease), lumbar   . Degenerative disc disease, cervical   . Difficult intubation    Past Surgical History:  Procedure Laterality Date  . ABDOMINAL HYSTERECTOMY    . APPENDECTOMY    . BACK SURGERY     x 3  . [place]  . CHOLECYSTECTOMY    . COLON SURGERY  1999   removed 18 inches of colon  . DILATION AND CURETTAGE OF UTERUS  2004  . ELBOW FRACTURE  SURGERY  2008  . JOINT REPLACEMENT Right 2015   partial knee replacement  . KYPHOPLASTY    . KYPHOPLASTY N/A 04/14/2015   Procedure: KYPHOPLASTY;  Surgeon: Hessie Knows, MD;  Location: [facility] ORS;  Service: Orthopedics;  Laterality: N/A;  . KYPHOPLASTY N/A 06/04/2015   Procedure: KYPHOPLASTY L-5;  Surgeon: Hessie Knows, MD;  Location: [facility] ORS;  Service: Orthopedics;  Laterality: N/A;  . MEDIAL PARTIAL KNEE REPLACEMENT    . OOPHORECTOMY Bilateral   . SHOULDER ARTHROSCOPY WITH ROTATOR CUFF REPAIR AND SUBACROMIAL DECOMPRESSION Right 04/13/2016   Procedure: SHOULDER ARTHROSCOPY WITH ROTATOR CUFF REPAIR AND SUBACROMIAL DECOMPRESSION, release of long head biceps tendon;  Surgeon: Leanor Kail, MD;  Location: [facility] ORS;  Service: Orthopedics;  Laterality: Right;  . TOE SURGERY  2013  . TONSILLECTOMY    . WRIST FRACTURE SURGERY Bilateral    Family History  Problem Relation Age of Onset  . Diabetes Mother   . Emphysema Mother   . Emphysema Father   . Breast cancer Neg Hx    Social History  Substance Use Topics  . Smoking status: Never Smoker  . Smokeless tobacco: Never Used  . Alcohol use No   OB History    No data available     Review of Systems  Constitutional: Positive for fatigue. Negative for chills and fever.  HENT: Positive for congestion and rhinorrhea. Negative for sinus pain, sinus pressure,  sneezing and sore throat.   Respiratory: Negative for cough, shortness of breath and wheezing.   Cardiovascular: Negative for chest pain and palpitations.  Gastrointestinal: Positive for abdominal pain, nausea and vomiting. Negative for constipation and diarrhea.  Musculoskeletal: Positive for myalgias.  Skin: Negative for rash.  Neurological: Positive for dizziness and headaches.    Allergies  Sulfa antibiotics; Cefazolin; Cephalosporins; Enbrel [etanercept]; Humira [adalimumab]; Levaquin [levofloxacin in d5w]; Lorabid [loracarbef]; Meropenem; Norco [hydrocodone-acetaminophen]; and  Primidone  Home Medications   Prior to Admission medications   Medication Sig Start Date End Date Taking? Authorizing Provider  aspirin EC 81 MG tablet Take 81 mg by mouth daily.   Yes Historical Provider, MD  Calcium-Vitamin D-Vitamin K (VIACTIV PO) Take 2 Doses by mouth every evening.   Yes Historical Provider, MD  cetirizine (ZYRTEC) 10 MG tablet Take 10 mg by mouth daily.   Yes Historical Provider, MD  clonazePAM (KLONOPIN) 1 MG tablet Take 1 mg by mouth at bedtime as needed (for sleep).    Yes Historical Provider, MD  cyanocobalamin (,VITAMIN B-12,) 1000 MCG/ML injection Inject 1,000 mcg into the muscle every 30 (thirty) days.   Yes Historical Provider, MD  Cyanocobalamin (VITAMIN B-12) 6000 MCG SUBL Place 6,000 mcg under the tongue daily.   Yes Historical Provider, MD  folic acid (FOLVITE) 1 MG tablet Take 1 mg by mouth daily.   Yes Historical Provider, MD  furosemide (LASIX) 20 MG tablet Take 20 mg by mouth every morning.    Yes Historical Provider, MD  magnesium oxide (MAG-OX) 400 MG tablet Take 400 mg by mouth daily.    Yes Historical Provider, MD  METHOTREXATE SODIUM IJ Inject 8 mg as directed once a week. Subcutaneous on Sundays   Yes Historical Provider, MD  Multiple Vitamins-Minerals (MULTIVITAMIN WITH MINERALS) tablet Take 1 tablet by mouth daily.   Yes Historical Provider, MD  omeprazole (PRILOSEC) 40 MG capsule Take 40 mg by mouth every morning.    Yes Historical Provider, MD  potassium chloride SA (K-DUR,KLOR-CON) 20 MEQ tablet Take 20 mEq by mouth daily.    Yes Historical Provider, MD  Probiotic Product (PROBIOTIC PO) Take 2 tablets by mouth every morning.   Yes Historical Provider, MD  rOPINIRole (REQUIP) 0.5 MG tablet Take 0.5 mg by mouth at bedtime as needed (for restless legs).    Yes Historical Provider, MD  sucralfate (CARAFATE) 1 g tablet Take 1 g by mouth 3 (three) times daily with meals as needed (for stomach cramping).   Yes Historical Provider, MD  Biotin 1000 MCG  tablet Take 1,000 mcg by mouth daily.    Historical Provider, MD  diphenoxylate-atropine (LOMOTIL) 2.5-0.025 MG per tablet Take 2 tablets by mouth every morning.     Historical Provider, MD  ondansetron (ZOFRAN ODT) 8 MG disintegrating tablet Take 1 tablet (8 mg total) by mouth every 8 (eight) hours as needed for nausea or vomiting. 08/15/16 08/20/16  Barry Dienes, NP  oseltamivir (TAMIFLU) 75 MG capsule Take 1 capsule (75 mg total) by mouth every 12 (twelve) hours. 08/15/16 08/20/16  Barry Dienes, NP  tapentadol (NUCYNTA) 50 MG tablet Take 50 mg by mouth every 6 (six) hours as needed.    Historical Provider, MD   Meds Ordered and Administered this Visit   Medications  ondansetron (ZOFRAN-ODT) disintegrating tablet 8 mg (8 mg Oral Given 08/15/16 1029)    BP 110/65 (BP Location: Left Arm)   Pulse 85   Temp 97.5 F (36.4 C) (Oral)   Resp 19  SpO2 96%  No data found.   Physical Exam  Constitutional: She is oriented to person, place, and time. No distress.  Laying in bed; able to sit up  HENT:  Head: Normocephalic and atraumatic.  Right Ear: External ear normal.  Left Ear: External ear normal.  Nose: Nose normal.  Mouth/Throat: Oropharynx is clear and moist. No oropharyngeal exudate.  TM pearly gray bilaterally with no erythema.  Eyes: Conjunctivae and EOM are normal. Pupils are equal, round, and reactive to light. Right eye exhibits no discharge. Left eye exhibits no discharge.  Neck: Normal range of motion. Neck supple.  Cardiovascular: Normal rate, regular rhythm and normal heart sounds.   No murmur heard. Pulmonary/Chest: Effort normal and breath sounds normal. No respiratory distress. She has no wheezes.  Abdominal: Soft. Bowel sounds are normal. She exhibits no distension.  Patient has generalized abdominal tenderness on palpation.  Lymphadenopathy:    She has no cervical adenopathy.  Neurological: She is alert and oriented to person, place, and time.  Skin: She is not diaphoretic.  No  diaphoresis noted. Warm and dry.    Urgent Care Course   Clinical Course     Procedures (including critical care time)  Labs Review Labs Reviewed  GLUCOSE, CAPILLARY - Abnormal; Notable for the following:       Result Value   Glucose-Capillary 120 (*)    All other components within normal limits  RAPID INFLUENZA A&B ANTIGENS ([facility] ONLY)  CBG MONITORING, ED    Imaging Review No results found.  11:07: Went in to reassess the patient, patient is feeling much better after the Zofran 8 mg. She states that her headache has also improved. She is resting in bed right now. Husband is being seem currently and influenza is pending for the husband.   11:16: Husband came back Positive for Influenza A   MDM   1. Influenza    Patient is feeling much better after Zofran. Husband came back positive for influenza A. Patient reports that her abdominal pain and diarrhea doesn't feel like her typical Crohn flareup. She most likely also has influenza, despite negative influenza. Given that she started getting sick last night, we'll treat with Tamiflu. Zofran also given to help with nausea. Informed to rest and a lot and alot hydration. Discharge instruction given and all questions were answered.   Barry Dienes, NP 08/15/16 1123

## 2016-08-15 NOTE — ED Triage Notes (Signed)
Patient complains of emesis, diarrhea, sweaty and clamy. Patient states that symptom started yesterday. Patient states that she feels like she is going to pass out.

## 2016-08-29 DIAGNOSIS — K654 Sclerosing mesenteritis: Secondary | ICD-10-CM | POA: Insufficient documentation

## 2016-08-29 DIAGNOSIS — N12 Tubulo-interstitial nephritis, not specified as acute or chronic: Secondary | ICD-10-CM | POA: Insufficient documentation

## 2016-08-29 DIAGNOSIS — M069 Rheumatoid arthritis, unspecified: Secondary | ICD-10-CM | POA: Insufficient documentation

## 2016-09-06 DIAGNOSIS — Z87448 Personal history of other diseases of urinary system: Secondary | ICD-10-CM | POA: Insufficient documentation

## 2016-09-06 DIAGNOSIS — E876 Hypokalemia: Secondary | ICD-10-CM | POA: Insufficient documentation

## 2016-09-06 DIAGNOSIS — R197 Diarrhea, unspecified: Secondary | ICD-10-CM | POA: Insufficient documentation

## 2016-09-15 ENCOUNTER — Emergency Department: Payer: Medicare Other

## 2016-09-15 ENCOUNTER — Emergency Department
Admission: EM | Admit: 2016-09-15 | Discharge: 2016-09-15 | Disposition: A | Discharge status: Home / Self Care | Payer: Medicare Other | Attending: Emergency Medicine | Admitting: Emergency Medicine

## 2016-09-15 ENCOUNTER — Encounter: Payer: Self-pay | Admitting: Emergency Medicine

## 2016-09-15 DIAGNOSIS — N200 Calculus of kidney: Secondary | ICD-10-CM | POA: Insufficient documentation

## 2016-09-15 DIAGNOSIS — R112 Nausea with vomiting, unspecified: Secondary | ICD-10-CM | POA: Diagnosis present

## 2016-09-15 DIAGNOSIS — R197 Diarrhea, unspecified: Secondary | ICD-10-CM | POA: Insufficient documentation

## 2016-09-15 DIAGNOSIS — M069 Rheumatoid arthritis, unspecified: Secondary | ICD-10-CM | POA: Insufficient documentation

## 2016-09-15 DIAGNOSIS — N3000 Acute cystitis without hematuria: Secondary | ICD-10-CM | POA: Diagnosis not present

## 2016-09-15 LAB — URINALYSIS, COMPLETE (UACMP) WITH MICROSCOPIC
BACTERIA UA: NONE SEEN
Bilirubin Urine: NEGATIVE
GLUCOSE, UA: NEGATIVE mg/dL
HGB URINE DIPSTICK: NEGATIVE
Ketones, ur: 5 mg/dL — AB
NITRITE: NEGATIVE
PROTEIN: 30 mg/dL — AB
Specific Gravity, Urine: 1.017 (ref 1.005–1.030)
pH: 6 (ref 5.0–8.0)

## 2016-09-15 LAB — COMPREHENSIVE METABOLIC PANEL
ALT: 15 U/L (ref 14–54)
ANION GAP: 10 (ref 5–15)
AST: 38 U/L (ref 15–41)
Albumin: 3.7 g/dL (ref 3.5–5.0)
Alkaline Phosphatase: 77 U/L (ref 38–126)
BILIRUBIN TOTAL: 1 mg/dL (ref 0.3–1.2)
BUN: 7 mg/dL (ref 6–20)
CO2: 23 mmol/L (ref 22–32)
Calcium: 8.9 mg/dL (ref 8.9–10.3)
Chloride: 105 mmol/L (ref 101–111)
Creatinine, Ser: 0.8 mg/dL (ref 0.44–1.00)
Glucose, Bld: 157 mg/dL — ABNORMAL HIGH (ref 65–99)
POTASSIUM: 4.2 mmol/L (ref 3.5–5.1)
Sodium: 138 mmol/L (ref 135–145)
TOTAL PROTEIN: 7.6 g/dL (ref 6.5–8.1)

## 2016-09-15 LAB — CBC
HEMATOCRIT: 31.1 % — AB (ref 35.0–47.0)
Hemoglobin: 10.9 g/dL — ABNORMAL LOW (ref 12.0–16.0)
MCH: 31.7 pg (ref 26.0–34.0)
MCHC: 35 g/dL (ref 32.0–36.0)
MCV: 90.6 fL (ref 80.0–100.0)
Platelets: 405 10*3/uL (ref 150–440)
RBC: 3.43 MIL/uL — ABNORMAL LOW (ref 3.80–5.20)
RDW: 14.5 % (ref 11.5–14.5)
WBC: 16.3 10*3/uL — AB (ref 3.6–11.0)

## 2016-09-15 LAB — LIPASE, BLOOD: Lipase: 33 U/L (ref 11–51)

## 2016-09-15 MED ORDER — DEXTROSE 5 % IV SOLN: 1.0000 g | Freq: Once | INTRAVENOUS | Status: DC

## 2016-09-15 MED ORDER — SODIUM CHLORIDE 0.9 % IV BOLUS (SEPSIS)
1000.0000 mL | Freq: Once | INTRAVENOUS | Status: AC
Start: 2016-09-15 — End: 2016-09-15
  Administered 2016-09-15: 1000 mL via INTRAVENOUS

## 2016-09-15 MED ORDER — LORAZEPAM 2 MG/ML IJ SOLN
0.5000 mg | Freq: Once | INTRAMUSCULAR | Status: AC
  Administered 2016-09-15: 0.5 mg via INTRAVENOUS
  Filled 2016-09-15: qty 1

## 2016-09-15 MED ORDER — LEVOFLOXACIN 750 MG PO TABS: 750.0000 mg | ORAL_TABLET | Freq: Every day | ORAL | 0 refills | Status: DC

## 2016-09-15 MED ORDER — SODIUM CHLORIDE 0.9 % IV SOLN
Freq: Once | INTRAVENOUS | Status: AC
  Administered 2016-09-15: 11:00:00 via INTRAVENOUS

## 2016-09-15 MED ORDER — ONDANSETRON HCL 4 MG/2ML IJ SOLN
4.0000 mg | Freq: Once | INTRAMUSCULAR | Status: AC | PRN
  Administered 2016-09-15: 4 mg via INTRAVENOUS
  Filled 2016-09-15: qty 2

## 2016-09-15 MED ORDER — MORPHINE SULFATE (PF) 4 MG/ML IV SOLN
4.0000 mg | Freq: Once | INTRAVENOUS | Status: AC
  Administered 2016-09-15: 4 mg via INTRAVENOUS

## 2016-09-15 MED ORDER — DEXAMETHASONE SODIUM PHOSPHATE 10 MG/ML IJ SOLN
10.0000 mg | Freq: Once | INTRAMUSCULAR | Status: AC
  Administered 2016-09-15: 10 mg via INTRAVENOUS
  Filled 2016-09-15: qty 1

## 2016-09-15 MED ORDER — HYDROMORPHONE HCL 1 MG/ML IJ SOLN
1.0000 mg | Freq: Once | INTRAMUSCULAR | Status: AC
Start: 2016-09-15 — End: 2016-09-15
  Administered 2016-09-15: 1 mg via INTRAVENOUS
  Filled 2016-09-15: qty 1

## 2016-09-15 MED ORDER — OXYCODONE-ACETAMINOPHEN 7.5-325 MG PO TABS: 1.0000 | ORAL_TABLET | ORAL | 0 refills | Status: DC | PRN

## 2016-09-15 MED ORDER — CEFTRIAXONE SODIUM-DEXTROSE 1-3.74 GM-% IV SOLR
1.0000 g | Freq: Once | INTRAVENOUS | Status: AC
  Administered 2016-09-15: 1 g via INTRAVENOUS
  Filled 2016-09-15: qty 50

## 2016-09-15 MED ORDER — MORPHINE SULFATE (PF) 2 MG/ML IV SOLN
INTRAVENOUS | Status: AC
  Filled 2016-09-15: qty 2

## 2016-09-15 MED ORDER — PREDNISONE 10 MG (21) PO TBPK: ORAL_TABLET | ORAL | 0 refills | Status: DC

## 2016-09-15 NOTE — ED Provider Notes (Signed)
[facility] Emergency Department Provider Note        Time seen: ----------------------------------------- 10:23 AM on 09/15/2016 -----------------------------------------    I have reviewed the triage vital signs and the nursing notes.   HISTORY  Chief Complaint Emesis and Shoulder Pain    HPI Nicole Bass is a 66 y.o. female who presents to ER for shoulder and arm pain since chest today. Patient states all night long she's had nausea and vomiting. She was seen by Dr. Doy Hutching who sent her to the ER for fluids and pain management. She's recently been discharged from hospital for UTI and kidney infection.Her main complaint is severe pain in the left arm and hand. Nothing makes it better, movement makes it worse.   Past Medical History:  Diagnosis Date  . Anemia   . Arthritis   . Collagen vascular disease ([place])   . Crohn's disease ([place])   . DDD (degenerative disc disease), cervical   . DDD (degenerative disc disease), cervical   . DDD (degenerative disc disease), cervical 2003  . DDD (degenerative disc disease), lumbar   . Degenerative disc disease, cervical   . Difficult intubation     Patient Active Problem List   Diagnosis Date Noted  . Crohn's colitis ([place]) 09/04/2015    Past Surgical History:  Procedure Laterality Date  . ABDOMINAL HYSTERECTOMY    . APPENDECTOMY    . BACK SURGERY     x 3  . [place]  . CHOLECYSTECTOMY    . COLON SURGERY  1999   removed 18 inches of colon  . DILATION AND CURETTAGE OF UTERUS  2004  . ELBOW FRACTURE SURGERY  2008  . JOINT REPLACEMENT Right 2015   partial knee replacement  . KYPHOPLASTY    . KYPHOPLASTY N/A 04/14/2015   Procedure: KYPHOPLASTY;  Surgeon: Hessie Knows, MD;  Location: [facility] ORS;  Service: Orthopedics;  Laterality: N/A;  . KYPHOPLASTY N/A 06/04/2015   Procedure: KYPHOPLASTY L-5;  Surgeon: Hessie Knows, MD;  Location: [facility] ORS;  Service: Orthopedics;  Laterality:  N/A;  . MEDIAL PARTIAL KNEE REPLACEMENT    . OOPHORECTOMY Bilateral   . SHOULDER ARTHROSCOPY WITH ROTATOR CUFF REPAIR AND SUBACROMIAL DECOMPRESSION Right 04/13/2016   Procedure: SHOULDER ARTHROSCOPY WITH ROTATOR CUFF REPAIR AND SUBACROMIAL DECOMPRESSION, release of long head biceps tendon;  Surgeon: Leanor Kail, MD;  Location: [facility] ORS;  Service: Orthopedics;  Laterality: Right;  . TOE SURGERY  2013  . TONSILLECTOMY    . WRIST FRACTURE SURGERY Bilateral     Allergies Sulfa antibiotics; Cefazolin; Cephalosporins; Enbrel [etanercept]; Humira [adalimumab]; Levaquin [levofloxacin in d5w]; Lorabid [loracarbef]; Meropenem; Norco [hydrocodone-acetaminophen]; and Primidone  Social History Social History  Substance Use Topics  . Smoking status: Never Smoker  . Smokeless tobacco: Never Used  . Alcohol use No    Review of Systems Constitutional: Negative for fever. Cardiovascular: Negative for chest pain. Respiratory: Negative for shortness of breath. Gastrointestinal: Negative for abdominal pain, Positive for vomiting and diarrhea Genitourinary: Negative for dysuria. Musculoskeletal: Positive for left arm pain diffusely Skin: Negative for rash. Neurological: Negative for headaches, focal weakness or numbness.  10-point ROS otherwise negative.  ____________________________________________   PHYSICAL EXAM:  VITAL SIGNS: ED Triage Vitals  Enc Vitals Group     BP 09/15/16 0908 124/72     Pulse Rate 09/15/16 0908 97     Resp 09/15/16 0908 20     Temp 09/15/16 0908 99.1 F (37.3 C)     Temp  Source 09/15/16 0908 Oral     SpO2 09/15/16 0908 97 %     Weight 09/15/16 0909 151 lb (68.5 kg)     Height 09/15/16 0909 5' 1"  (1.549 m)     Head Circumference --      [place] --      Pain Score 09/15/16 0919 10     Pain Loc --      Pain Edu? --      Excl. in [place]? --    Constitutional: Alert and oriented. Mild to moderate distress Eyes: Conjunctivae are normal. PERRL. Normal  extraocular movements. ENT   Head: Normocephalic and atraumatic.   Nose: No congestion/rhinnorhea.   Mouth/Throat: Mucous membranes are moist.   Neck: No stridor. Cardiovascular: Normal rate, regular rhythm. No murmurs, rubs, or gallops. Respiratory: Normal respiratory effort without tachypnea nor retractions. Breath sounds are clear and equal bilaterally. No wheezes/rales/rhonchi. Gastrointestinal: Soft and nontender. Normal bowel sounds Musculoskeletal: Severe pain with range of motion or manipulation of the left upper extremity Neurologic:  Normal speech and language. No gross focal neurologic deficits are appreciated.  Skin:  Skin is warm, dry and intact. Scattered erythematous skin lesions Psychiatric: Depressed mood and affect ____________________________________________  EKG: Interpreted by me. Sinus rhythm rate 85 bpm, normal PR interval, normal QRS, normal QT, normal axis.  ____________________________________________  ED COURSE:  Pertinent labs & imaging results that were available during my care of the patient were reviewed by me and considered in my medical decision making (see chart for details). Patient presents to ER for severe arm pain that she states is from a root arthritis flare. We will assess with labs, give IV fluids and, pain medicine and Decadron.   Procedures ____________________________________________   LABS (pertinent positives/negatives)  Labs Reviewed  COMPREHENSIVE METABOLIC PANEL - Abnormal; Notable for the following:       Result Value   Glucose, Bld 157 (*)    All other components within normal limits  CBC - Abnormal; Notable for the following:    WBC 16.3 (*)    RBC 3.43 (*)    Hemoglobin 10.9 (*)    HCT 31.1 (*)    All other components within normal limits  URINALYSIS, COMPLETE (UACMP) WITH MICROSCOPIC - Abnormal; Notable for the following:    Color, Urine YELLOW (*)    APPearance HAZY (*)    Ketones, ur 5 (*)    Protein, ur  30 (*)    Leukocytes, UA LARGE (*)    Squamous Epithelial / LPF 0-5 (*)    All other components within normal limits  URINE CULTURE  LIPASE, BLOOD    RADIOLOGY Images were viewed by me  Renal ultrasound IMPRESSION: 1. No obstructive uropathy. 2. Nonobstructing 4 mm right renal calculus. ____________________________________________  FINAL ASSESSMENT AND PLAN  Pain, UTI, vomiting  Plan: Patient with labs and imaging as dictated above. Patient presented essentially with intractable pain from rheumatoid arthritis. She is also identified as having yet another UTI. We have sent a urine culture, she's received IV antibiotics. I did offer admission into the hospital but she would prefer to go home at this time. She'll be placed on a steroid taper, pain medication and antibiotic coverage for UTI. She is stable for outpatient follow-up.   Earleen Newport, MD   Note: This note was generated in part or whole with voice recognition software. Voice recognition is usually quite accurate but there are transcription errors that can and very often do occur. I apologize for  any typographical errors that were not detected and corrected.     Earleen Newport, MD 09/15/16 [phone]

## 2016-09-15 NOTE — ED Triage Notes (Signed)
C/O shoulder and arm pain since yesterday.  Additionally, all night long c/o vomiting and nausea.  Seen by Dr. Doy Hutching who sent patient to ED for IVF and pain management. Recently discharged from hospital due to UTI and bacterial kidney infection.

## 2016-09-15 NOTE — ED Notes (Signed)
Patient transported to Ultrasound 

## 2016-09-15 NOTE — ED Notes (Signed)
Patient anxious.  Emotional support given with no relief.   Patient c/o pain. Morphine ordered and administered per MAR.  Continue to monitor.

## 2016-09-29 DIAGNOSIS — M7542 Impingement syndrome of left shoulder: Secondary | ICD-10-CM | POA: Insufficient documentation

## 2016-10-31 ENCOUNTER — Encounter: Payer: Self-pay | Admitting: Occupational Therapy

## 2016-10-31 ENCOUNTER — Ambulatory Visit: Payer: Medicare Other | Attending: Rheumatology | Admitting: Occupational Therapy

## 2016-10-31 DIAGNOSIS — M25642 Stiffness of left hand, not elsewhere classified: Secondary | ICD-10-CM

## 2016-10-31 DIAGNOSIS — M6281 Muscle weakness (generalized): Secondary | ICD-10-CM | POA: Insufficient documentation

## 2016-10-31 NOTE — Patient Instructions (Addendum)
Wear oval 8 splint on L 2nd PIP  during crafts  Joint protection principles reviewed - avoid lat grip  Use more 3 point if needed  Avoid sustained , tight grip  And use larger joints  Tendon glides - place and hold as needed Opposition  RD of digits  Heat and massage to A1pulley at thumb  And ice massage  Can use bandaid to block IP from triggering during act

## 2016-10-31 NOTE — Therapy (Signed)
[place] [facility] [address] [place], [address] Phone: [phone]   Fax:  [phone]  Occupational Therapy Evaluation  Patient Details  Name: Nicole Bass MRN: [id number] Date of Birth: 1951-01-16 Referring Provider: Jefm Bryant  Encounter Date: 10/31/2016      OT End of Session - 10/31/16 1601    Visit Number 1   Number of Visits 8   Date for OT Re-Evaluation 11/28/16   OT Start Time 1005   OT Stop Time 1114   OT Time Calculation (min) 69 min   Activity Tolerance Patient tolerated treatment well   Behavior During Therapy [facility] for tasks assessed/performed      Past Medical History:  Diagnosis Date  . Anemia   . Arthritis   . Collagen vascular disease ([place])   . Crohn's disease ([place])   . DDD (degenerative disc disease), cervical   . DDD (degenerative disc disease), cervical   . DDD (degenerative disc disease), cervical 2003  . DDD (degenerative disc disease), lumbar   . Degenerative disc disease, cervical   . Difficult intubation     Past Surgical History:  Procedure Laterality Date  . ABDOMINAL HYSTERECTOMY    . APPENDECTOMY    . BACK SURGERY     x 3  . [place]  . CHOLECYSTECTOMY    . COLON SURGERY  1999   removed 18 inches of colon  . DILATION AND CURETTAGE OF UTERUS  2004  . ELBOW FRACTURE SURGERY  2008  . JOINT REPLACEMENT Right 2015   partial knee replacement  . KYPHOPLASTY    . KYPHOPLASTY N/A 04/14/2015   Procedure: KYPHOPLASTY;  Surgeon: Hessie Knows, MD;  Location: [facility] ORS;  Service: Orthopedics;  Laterality: N/A;  . KYPHOPLASTY N/A 06/04/2015   Procedure: KYPHOPLASTY L-5;  Surgeon: Hessie Knows, MD;  Location: [facility] ORS;  Service: Orthopedics;  Laterality: N/A;  . MEDIAL PARTIAL KNEE REPLACEMENT    . OOPHORECTOMY Bilateral   . SHOULDER ARTHROSCOPY WITH ROTATOR CUFF REPAIR AND SUBACROMIAL DECOMPRESSION Right 04/13/2016   Procedure: SHOULDER ARTHROSCOPY WITH  ROTATOR CUFF REPAIR AND SUBACROMIAL DECOMPRESSION, release of long head biceps tendon;  Surgeon: Leanor Kail, MD;  Location: [facility] ORS;  Service: Orthopedics;  Laterality: Right;  . TOE SURGERY  2013  . TONSILLECTOMY    . WRIST FRACTURE SURGERY Bilateral     There were no vitals filed for this visit.      Subjective Assessment - 10/31/16 1010    Subjective  Had arthritis flare up in Febr- and hand was in fist - went to ER - and since then index finger giving me trouble - index finger going sideways cannot pinch - they diagnosis me with thyroid issues   Patient Stated Goals Want my index finger better and my thumb not to triggering - want to open jars better , pick up small things , squeeze toothpast and grip curling iron    Currently in Pain? No/denies           [facility] OT Assessment - 10/31/16 0001      Assessment   Diagnosis L hand pain    Referring Provider Jefm Bryant   Onset Date 09/15/16     Balance Screen   Has the patient fallen in the past 6 months Yes   How many times? 1   Has the patient had a decrease in activity level because of a fear of falling?  Yes  more because of  flareup - and doing pool to get endurance   Is the patient reluctant to leave their home because of a fear of falling?  No     Home  Environment   Lives With Spouse     Prior Function   Leisure R hand dominant - doing pool, cooking , photograpy , scrapbook , tie blankets for new borns, cards, read , tablet      Strength   Right Hand Grip (lbs) 39   Right Hand Lateral Pinch 9 lbs   Right Hand 3 Point Pinch 6 lbs   Left Hand Grip (lbs) 35   Left Hand Lateral Pinch 6 lbs   Left Hand 3 Point Pinch --  unable      Right Hand AROM   R Index  MCP 0-90 75 Degrees   R Index PIP 0-100 100 Degrees   R Long  MCP 0-90 90 Degrees   R Long PIP 0-100 100 Degrees   R Ring  MCP 0-90 90 Degrees   R Ring PIP 0-100 100 Degrees   R Little  MCP 0-90 90 Degrees   R Little PIP 0-100 90 Degrees     Left Hand  AROM   L Index  MCP 0-90 90 Degrees   L Index PIP 0-100 75 Degrees  deviating ulnarly - cannot pinch   L Index DIP 0-70 30 Degrees   L Little PIP 0-100 --  -60     Pt was fitted with oval 8 splint - adjustments had to be made because of middle phalanges smaller than proximal - she is nr 9  Also did small piece of felt use on ulnar side of distal part to decrease further deviation    Wear oval 8 splint on L 2nd PIP  during crafts  Joint protection principles reviewed - avoid lat grip  Use more 3 point if needed  Avoid sustained , tight grip  And use larger joints  Tendon glides - place and hold as needed Opposition  RD of digits  Heat and massage to A1pulley at thumb  And ice massage  Can use bandaid to block IP from triggering during act                     OT Education - 10/31/16 1743    Education provided Yes   Education Details Joint protection and AE education , HEP , oval 8 spling wearing   Person(s) Educated Patient   Methods Explanation;Demonstration;Tactile cues;Verbal cues;Handout   Comprehension Verbal cues required;Returned demonstration;Verbalized understanding          OT Short Term Goals - 10/31/16 1752      OT SHORT TERM GOAL #1   Title Pt to be ind in HEP to wear oval 8 splint, increase ROM , increase use of 3 point pinch in ADL's and decrease triggering    Baseline no knowledge or splint    Time 3   Period Weeks   Status New           OT Long Term Goals - 10/31/16 1753      OT LONG TERM GOAL #1   Title Pt to verbalize 3 joint protection and AE to decrease stress on joints in hands    Baseline very little knowledge   Time 4   Status New     OT LONG TERM GOAL #2   Title Pt show decrease triggering of L thumb with less than 3 episodes a week to  increase use of L hand    Baseline triggering thru out the day since Febr flareup    Time 4   Period Weeks   Status New               Plan - 10/31/16 1745    Clinical  Impression Statement Pt present with unable to use L 2nd digit in pinch grip - 2nd digit show ulnar deviation out of PIP with flexion , unable to do 3 point pinch - using it in ADL's - as well as triggering of L thumb - tender over A1pulley  - pt was fitted with Oval 8 splint on 2nd PIP and was modify to decrease UD of digit - pt was also educated about  joint protection principles - and avoiding lateral grip where she load middle phalanges - educated on HEP and adaptive equipment - pt  to do home program for week and will follow up - assess if need iontophoresis with dexamethazone  on L thumb A1pulley    Rehab Potential Fair   Clinical Impairments Affecting Rehab Potential chronic condition    OT Frequency 2x / week   OT Duration 4 weeks   OT Treatment/Interventions Self-care/ADL training;Splinting;Patient/family education;Therapeutic exercises;Manual Therapy;Iontophoresis   Plan assess if trigger finger better - and assess use of oval 8 splint and joint protection    OT Home Exercise Plan see pt instruction   Consulted and Agree with Plan of Care Patient      Patient will benefit from skilled therapeutic intervention in order to improve the following deficits and impairments:  Decreased coordination, Decreased range of motion, Impaired flexibility, Impaired UE functional use, Decreased knowledge of use of DME, Decreased strength  Visit Diagnosis: Stiffness of left hand, not elsewhere classified - Plan: Ot plan of care cert/re-cert  Muscle weakness (generalized) - Plan: Ot plan of care cert/re-cert      G-Codes - 10/25/79 1756    Functional Assessment Tool Used (Outpatient only) ROM , grip and prehension , clinical judgement , palpation    Functional Limitation Self care   Self Care Current Status (V8867) At least 20 percent but less than 40 percent impaired, limited or restricted   Self Care Goal Status (R3736) At least 1 percent but less than 20 percent impaired, limited or restricted       Problem List Patient Active Problem List   Diagnosis Date Noted  . Crohn's colitis ([place]) 09/04/2015    Rosalyn Gess OTR/L,CLT 10/31/2016, 6:02 PM  [place] [facility] [address] [place], [address] Phone: [phone]   Fax:  [phone]  Name: KYRAH SCHIRO MRN: [id number] Date of Birth: [birth year]

## 2016-11-03 ENCOUNTER — Other Ambulatory Visit: Payer: Self-pay | Admitting: Internal Medicine

## 2016-11-03 DIAGNOSIS — R251 Tremor, unspecified: Secondary | ICD-10-CM

## 2016-11-07 ENCOUNTER — Ambulatory Visit: Payer: Medicare Other | Admitting: Occupational Therapy

## 2016-11-07 DIAGNOSIS — M6281 Muscle weakness (generalized): Secondary | ICD-10-CM

## 2016-11-07 DIAGNOSIS — M25642 Stiffness of left hand, not elsewhere classified: Secondary | ICD-10-CM | POA: Diagnosis not present

## 2016-11-07 NOTE — Patient Instructions (Signed)
Assess use of buddy strap distal to PIP for 2nd and 3rd - to decrease keeping finger extended and using 3rd more  -  Use it with small objects, pick up, simulate opening small packages , Pt to wear 1-2 hrs at time - several times during day - when using dexterity And still wear if needed  oval 8 splint on L 2nd PIP  during crafts  Joint protection principles reviewed - again for using larger joint and avoid tight and sustained grip  - avoid lat grip  Use more 3 point if needed   Tendon glides - place and hold as needed Opposition  RD of digits Needed min A

## 2016-11-07 NOTE — Therapy (Signed)
[place] [facility] [address], [place], [address] Phone: [phone]   Fax:  [phone]  Occupational Therapy Treatment  Patient Details  Name: Nicole Bass MRN: [id number] Date of Birth: 11-03-1950 Referring Provider: Jefm Bryant  Encounter Date: 11/07/2016      OT End of Session - 11/07/16 1238    Visit Number 2   Number of Visits 8   Date for OT Re-Evaluation 11/28/16   OT Start Time 1046   OT Stop Time 1150   OT Time Calculation (min) 64 min   Activity Tolerance Patient tolerated treatment well   Behavior During Therapy [facility] for tasks assessed/performed      Past Medical History:  Diagnosis Date  . Anemia   . Arthritis   . Collagen vascular disease ([place])   . Crohn's disease ([place])   . DDD (degenerative disc disease), cervical   . DDD (degenerative disc disease), cervical   . DDD (degenerative disc disease), cervical 2003  . DDD (degenerative disc disease), lumbar   . Degenerative disc disease, cervical   . Difficult intubation     Past Surgical History:  Procedure Laterality Date  . ABDOMINAL HYSTERECTOMY    . APPENDECTOMY    . BACK SURGERY     x 3  . [place]  . CHOLECYSTECTOMY    . COLON SURGERY  1999   removed 18 inches of colon  . DILATION AND CURETTAGE OF UTERUS  2004  . ELBOW FRACTURE SURGERY  2008  . JOINT REPLACEMENT Right 2015   partial knee replacement  . KYPHOPLASTY    . KYPHOPLASTY N/A 04/14/2015   Procedure: KYPHOPLASTY;  Surgeon: Hessie Knows, MD;  Location: [facility] ORS;  Service: Orthopedics;  Laterality: N/A;  . KYPHOPLASTY N/A 06/04/2015   Procedure: KYPHOPLASTY L-5;  Surgeon: Hessie Knows, MD;  Location: [facility] ORS;  Service: Orthopedics;  Laterality: N/A;  . MEDIAL PARTIAL KNEE REPLACEMENT    . OOPHORECTOMY Bilateral   . SHOULDER ARTHROSCOPY WITH ROTATOR CUFF REPAIR AND SUBACROMIAL DECOMPRESSION Right 04/13/2016   Procedure: SHOULDER ARTHROSCOPY WITH  ROTATOR CUFF REPAIR AND SUBACROMIAL DECOMPRESSION, release of long head biceps tendon;  Surgeon: Leanor Kail, MD;  Location: [facility] ORS;  Service: Orthopedics;  Laterality: Right;  . TOE SURGERY  2013  . TONSILLECTOMY    . WRIST FRACTURE SURGERY Bilateral     There were no vitals filed for this visit.      Subjective Assessment - 11/07/16 1156    Subjective  My little oval 8 splint did not stay on - slid off - and I find that I am sticking up my index finger and not using it - no pain    Patient Stated Goals Want my index finger better and my thumb not to triggering - want to open jars better , pick up small things , squeeze toothpast and grip curling iron    Currently in Pain? No/denies      Discuss and problem solve with pt list of act she has hard time  Curling hair, turning key, open small packages, hold book ( stand), dropping objects Pick up small objects, buttons  SHowed pt AE for buttonhook, key adaptors , springloaded scissors, built up handles utencils , wand curling iron,   Assess use of buddy strap distal to PIP for 2nd and 3rd - to decrease keeping finger extended and using 3rd more  -  Use it with small objects, pick up, simulate opening  small packages , Pt to wear 1-2 hrs at time - several times during day - when using dexterity And still wear if needed  oval 8 splint on L 2nd PIP  during crafts  Joint protection principles reviewed - again for using larger joint and avoid tight and sustained grip  - avoid lat grip  Use more 3 point if needed   Tendon glides - place and hold as needed Opposition  RD of digits Needed min A   Iontophoresis with dexamethazone with small patch and current 1.2 over A1 pulley for thumb  30 min to decrease triggering of thumb    skin check done prior and end - 2 small blisters- but ed on what to expect prior to end  Reviewed with pt  silver ring splints web site and  also option  - lateral stabilization one - for full ROM person -   is available - web site info provided                           OT Education - 11/07/16 1238    Education provided Yes   Education Details Joint protection - AE and use of buddy strap and oval 8 splint    Person(s) Educated Patient   Methods Explanation;Demonstration;Tactile cues;Verbal cues;Handout   Comprehension Verbal cues required;Returned demonstration;Verbalized understanding          OT Short Term Goals - 10/31/16 1752      OT SHORT TERM GOAL #1   Title Pt to be ind in HEP to wear oval 8 splint, increase ROM , increase use of 3 point pinch in ADL's and decrease triggering    Baseline no knowledge or splint    Time 3   Period Weeks   Status New           OT Long Term Goals - 10/31/16 1753      OT LONG TERM GOAL #1   Title Pt to verbalize 3 joint protection and AE to decrease stress on joints in hands    Baseline very little knowledge   Time 4   Status New     OT LONG TERM GOAL #2   Title Pt show decrease triggering of L thumb with less than 3 episodes a week to increase use of L hand    Baseline triggering thru out the day since Febr flareup    Time 4   Period Weeks   Status New               Plan - 11/07/16 1239    Clinical Impression Statement Pt still show ulnar deviation of 2nd PIP - show decrease DIP/PIP flexion - no pain - pt report oval 8 splint sliding off and keeping 2nd extended - fitted with buddy strap to use and reviewed AE and joint  protection in dept to prevent if from getting worse - ionto order recieved from Dr Jefm Bryant - will do 4 sessions to assess if triggering decrease - pt follow up appt with Md middle April   Rehab Potential Fair   Clinical Impairments Affecting Rehab Potential chronic condition    OT Frequency 2x / week   OT Duration 4 weeks   OT Treatment/Interventions Self-care/ADL training;Splinting;Patient/family education;Therapeutic exercises;Manual Therapy;Iontophoresis   Plan assess oval 8 splint -  and progress with buddy strap - use of joint protection    OT Home Exercise Plan see pt instruction   Consulted and Agree with  Plan of Care Patient      Patient will benefit from skilled therapeutic intervention in order to improve the following deficits and impairments:  Decreased coordination, Decreased range of motion, Impaired flexibility, Impaired UE functional use, Decreased knowledge of use of DME, Decreased strength  Visit Diagnosis: Stiffness of left hand, not elsewhere classified  Muscle weakness (generalized)    Problem List Patient Active Problem List   Diagnosis Date Noted  . Crohn's colitis ([place]) 09/04/2015    Rosalyn Gess OTR/L,CLT  11/07/2016, 12:42 PM  [place] [facility] [address] [place], [address] Phone: [phone]   Fax:  [phone]  Name: Nicole Bass MRN: [id number] Date of Birth: [birth year]

## 2016-11-10 ENCOUNTER — Ambulatory Visit: Payer: Medicare Other | Admitting: Occupational Therapy

## 2016-11-10 DIAGNOSIS — M25642 Stiffness of left hand, not elsewhere classified: Secondary | ICD-10-CM | POA: Diagnosis not present

## 2016-11-10 DIAGNOSIS — M6281 Muscle weakness (generalized): Secondary | ICD-10-CM

## 2016-11-10 IMAGING — MR MR LUMBAR SPINE W/O CM
4 of 5 series · 28 of 48 positions shown · non-contrast
Comparison: CT Abdomen and Pelvis 10/19/2013. Lumbar MRI
10/24/2012.

CLINICAL DATA: 64-year-old female who fell 3 weeks ago. Subsequent
lumbar back pain radiating to the right hip. Initial encounter.

EXAM:
MRI LUMBAR SPINE WITHOUT CONTRAST
TECHNIQUE: Multiplanar, multisequence MR imaging of the lumbar spine was
performed. No intravenous contrast was administered.

[Series 2: T2 · sagittal · 4.0mm · 1.02mm/px · 6 of 17 slices shown (1 of 2)]
[im 1/17]
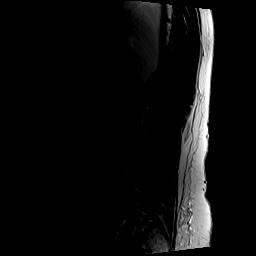
[im 4/17]
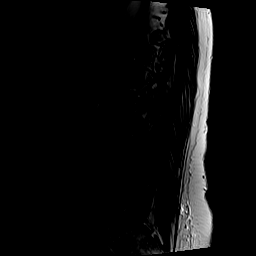
[im 7/17]
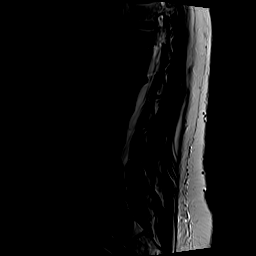
[im 10/17]
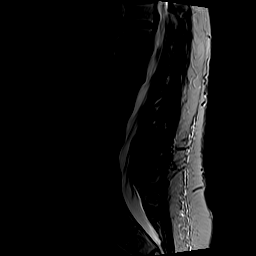
[im 13/17]
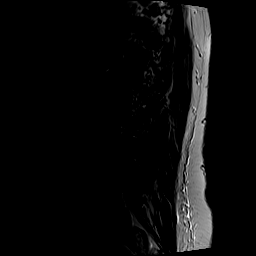
[im 17/17]
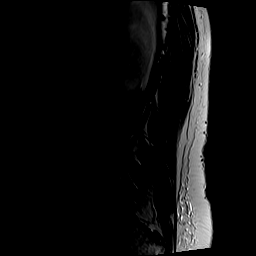

[Series 3: T1 · sagittal · 4.0mm · 0.51mm/px · 6 of 17 slices shown (1 of 2)]
[im 1/17]
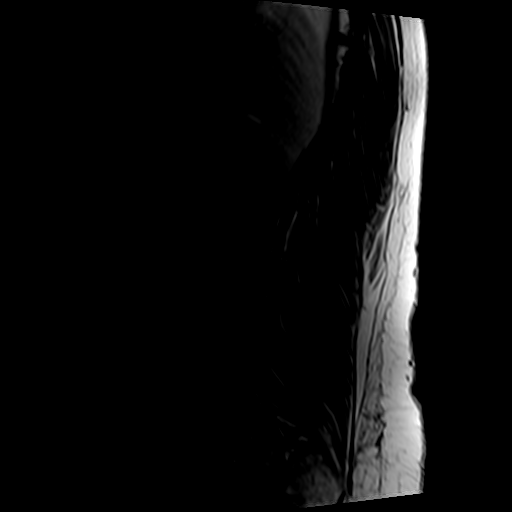
[im 4/17]
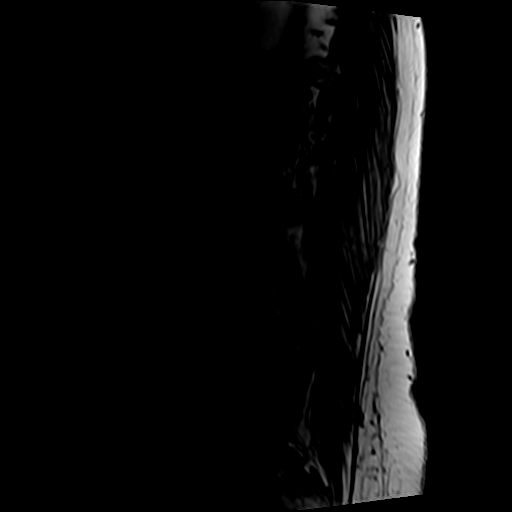
[im 7/17]
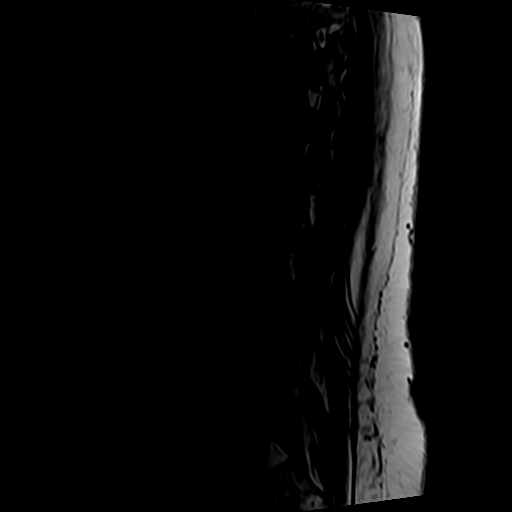
[im 10/17]
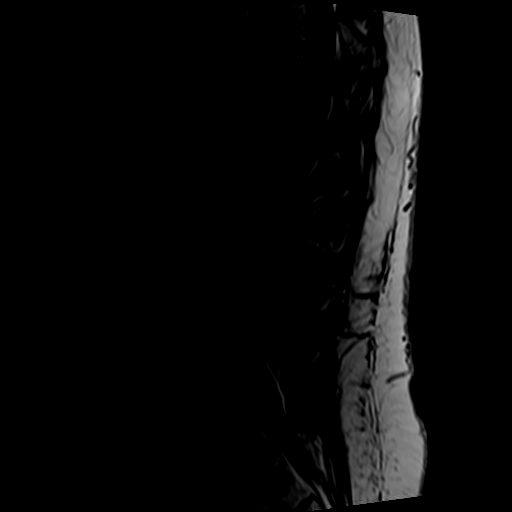
[im 13/17]
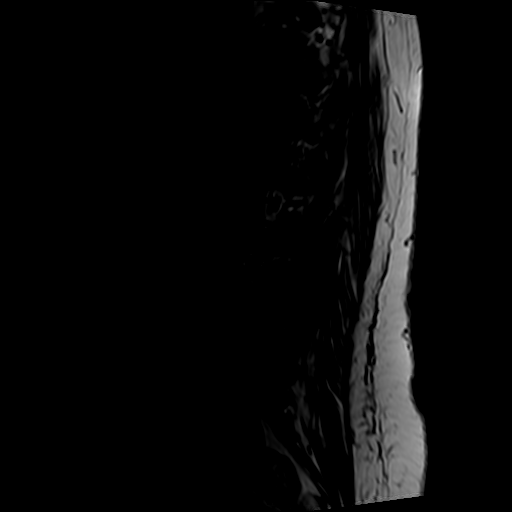
[im 17/17]
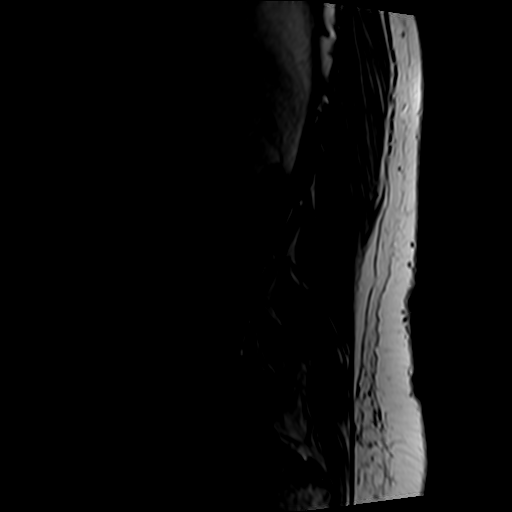

[Series 5: T2 · axial · 4.0mm · 0.78mm/px · z∈[-83,+159]mm · 9 of 40 slices shown (2 of 2)]
[im 1/40]
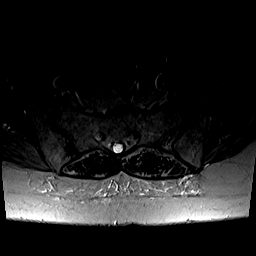
[im 6/40]
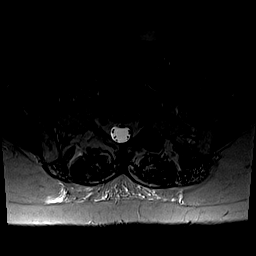
[im 12/40]
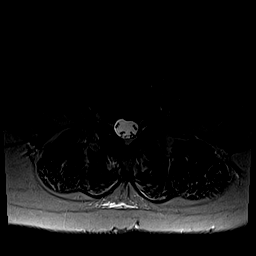
[im 17/40]
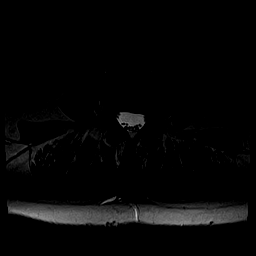
[im 20/40]
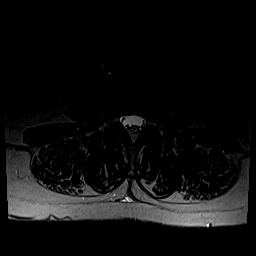
[im 23/40]
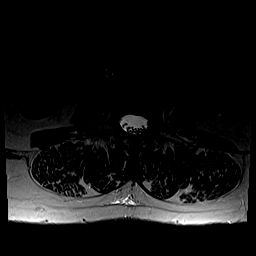
[im 28/40]
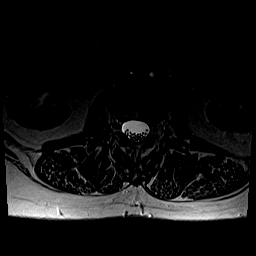
[im 34/40]
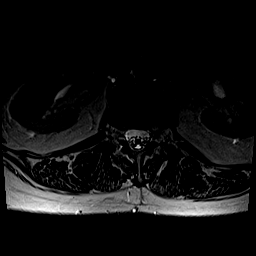
[im 40/40]
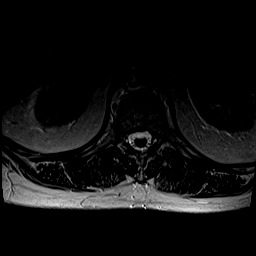

[Series 6: T1 · axial · 4.0mm · 0.39mm/px · z∈[-83,+129]mm · 7 of 40 slices shown (2 of 2)]
[im 1/40]
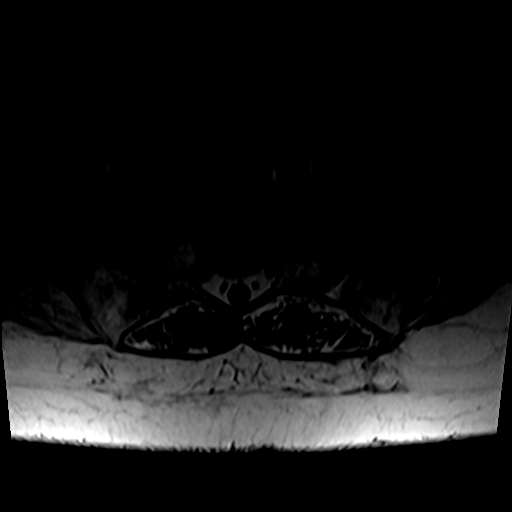
[im 6/40]
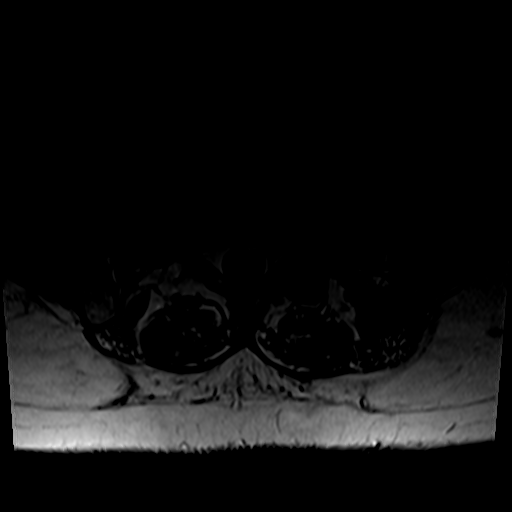
[im 12/40]
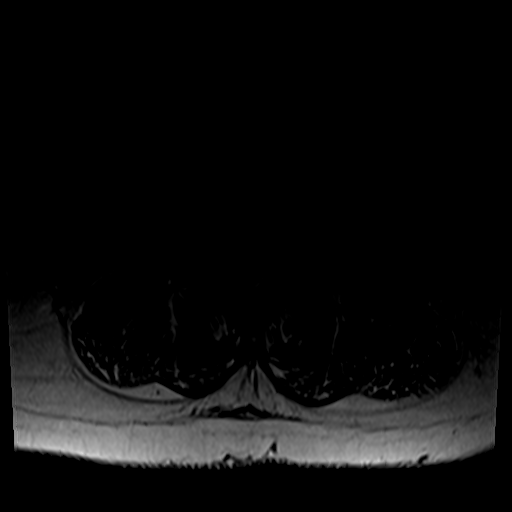
[im 17/40]
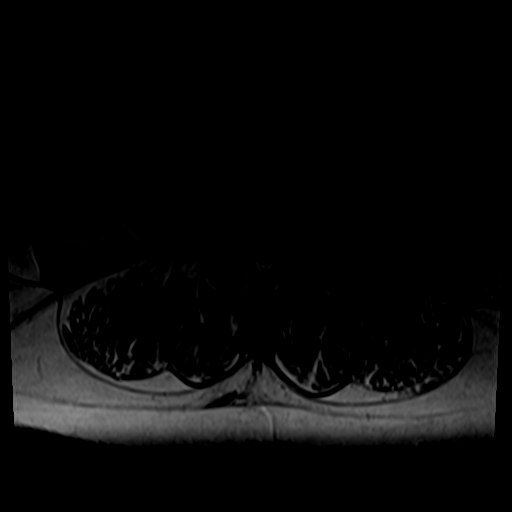
[im 20/40]
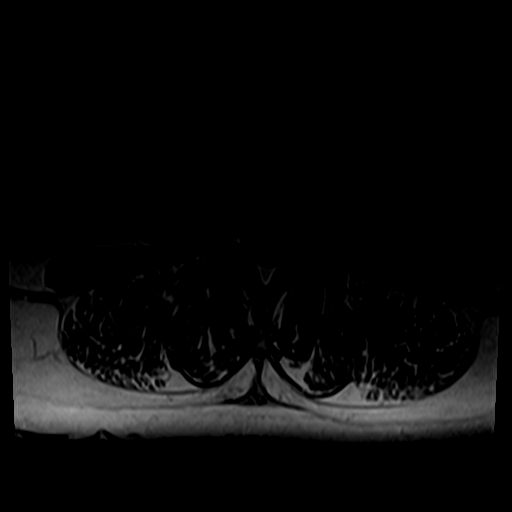
[im 23/40]
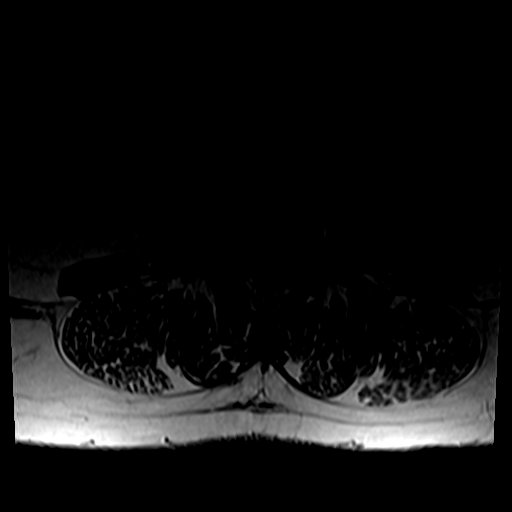
[im 34/40]
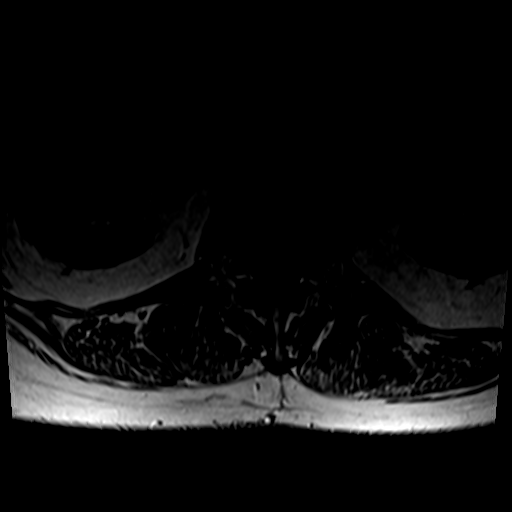

[28 of 48 positions shown; findings below may reference images not displayed]

FINDINGS: Normal lumbar segmentation demonstrated in 4687, which is the same
numbering system used in 9842.

Compression fracture of the L1 vertebral body, with deformity
primarily affecting the inferior endplate. Marrow edema throughout
the L1 body. Loss of height of 30-35%. Both pedicles appear to
remain intact. There is no posterior element marrow edema, however,
there is increased bilateral facet joint fluid at L1-L2. Mild
retropulsion of the posterior inferior endplate, but no significant
spinal stenosis. See also degenerative details below.

Trace anterolisthesis at L4-L5 has progressed since 9842. No other
acute osseous abnormality identified.

Visualized lower thoracic spinal cord is normal with conus medularis
at L1-L2.

Stable visualized abdominal viscera.

T11-T12: Chronic cephalad disc extrusion appears stable since 9842
(series 2, image 9). Narrowing of the ventral CSF space without
definite spinal stenosis.

T12-L1: Chronic disc bulge and small central disc protrusion are
stable to mildly increased. No stenosis.

L1-L2: Increased left eccentric disc osteophyte complex, in part
related to the L1 compression. Increased bilateral facet joint
fluid. No significant spinal stenosis. Borderline to mild left L1
foraminal stenosis is new.

L2-L3:  Stable mild disc bulge, no stenosis.

L3-L4: Stable mild disc bulge, primarily far laterally. Borderline
to mild left L3 foraminal stenosis is stable.

L4-L5: Increased anterolisthesis. Stable left eccentric disc bulge.
Moderate to severe facet and ligament flavum hypertrophy has
slightly increased. Still, no spinal or lateral recess stenosis.
Mild left L4 foraminal stenosis is mildly increased.

L5-S1: Stable circumferential disc bulge. Moderate facet hypertrophy
greater on the left also appears stable. No spinal or lateral recess
stenosis. Mild to moderate left L5 foraminal stenosis is stable.
IMPRESSION: 1. Acute to subacute L1 compression fracture with up to 35% loss of
height. Mild retropulsion of the posterior inferior endplate without
associated spinal stenosis. There is new mild left L1 foraminal
stenosis.
2. Mildly increased grade 1 spondylolisthesis at L4-L5 with disc and
posterior element degeneration. Increased mild left L4 foraminal
stenosis.
3. Other lumbar levels are stable since [DATE].

## 2016-11-10 NOTE — Patient Instructions (Addendum)
Cont with taking turns buddystrap or oval 8 to decrease deviation   bandaid on IP of thumb to decrease IP flexion -and ice at MP of thumb

## 2016-11-10 NOTE — Therapy (Signed)
[facility] [address] [place], [address] Phone: [phone]   Fax:  [phone]  Occupational Therapy Treatment  Patient Details  Name: ILIYANA CONVEY MRN: [id number] Date of Birth: Nov 06, 1950 Referring Provider: Jefm Bryant  Encounter Date: 11/10/2016      OT End of Session - 11/10/16 3734    Visit Number 3   Number of Visits 8   Date for OT Re-Evaluation 11/28/16   OT Start Time 1450   OT Stop Time 1553   OT Time Calculation (min) 63 min   Activity Tolerance Patient tolerated treatment well   Behavior During Therapy [facility] for tasks assessed/performed      Past Medical History:  Diagnosis Date  . Anemia   . Arthritis   . Collagen vascular disease ([place])   . Crohn's disease ([place])   . DDD (degenerative disc disease), cervical   . DDD (degenerative disc disease), cervical   . DDD (degenerative disc disease), cervical 2003  . DDD (degenerative disc disease), lumbar   . Degenerative disc disease, cervical   . Difficult intubation     Past Surgical History:  Procedure Laterality Date  . ABDOMINAL HYSTERECTOMY    . APPENDECTOMY    . BACK SURGERY     x 3  . [place]  . CHOLECYSTECTOMY    . COLON SURGERY  1999   removed 18 inches of colon  . DILATION AND CURETTAGE OF UTERUS  2004  . ELBOW FRACTURE SURGERY  2008  . JOINT REPLACEMENT Right 2015   partial knee replacement  . KYPHOPLASTY    . KYPHOPLASTY N/A 04/14/2015   Procedure: KYPHOPLASTY;  Surgeon: Hessie Knows, MD;  Location: [facility]S;  Service: Orthopedics;  Laterality: N/A;  . KYPHOPLASTY N/A 06/04/2015   Procedure: KYPHOPLASTY L-5;  Surgeon: Hessie Knows, MD;  Location: [facility];  Service: Orthopedics;  Laterality: N/A;  . MEDIAL PARTIAL KNEE REPLACEMENT    . OOPHORECTOMY Bilateral   . SHOULDER ARTHROSCOPY WITH ROTATOR CUFF REPAIR AND SUBACROMIAL DECOMPRESSION Right 04/13/2016   Procedure: SHOULDER ARTHROSCOPY WITH  ROTATOR CUFF REPAIR AND SUBACROMIAL DECOMPRESSION, release of long head biceps tendon;  Surgeon: Leanor Kail, MD;  Location: [facility];  Service: Orthopedics;  Laterality: Right;  . TOE SURGERY  2013  . TONSILLECTOMY    . WRIST FRACTURE SURGERY Bilateral     There were no vitals filed for this visit.      Subjective Assessment - 11/10/16 1533    Subjective  I brought my oval 8 splint for you to adjust -I did use the buddy strap but only hour at time - I'll try both - did check the silver splints but it was expensive    Patient Stated Goals Want my index finger better and my thumb not to triggering - want to open jars better , pick up small things , squeeze toothpast and grip curling iron    Currently in Pain? No/denies        Oval 8 was adjusted t-he distal part on  ulnar side was molded more snug -   Ionto done to A1pulley at thumb MP  -small patch , current .8 with increase time this date -  Had to adjust current 3 x because of pain -  tender with palpation and still triggering                         OT Education -  11/10/16 1540    Education provided Yes   Education Details USe of buddy strap or Oval 8 splint    Person(s) Educated Patient   Methods Explanation;Demonstration;Tactile cues;Verbal cues   Comprehension Verbal cues required;Returned demonstration;Verbalized understanding          OT Short Term Goals - 10/31/16 1752      OT SHORT TERM GOAL #1   Title Pt to be ind in HEP to wear oval 8 splint, increase ROM , increase use of 3 point pinch in ADL's and decrease triggering    Baseline no knowledge or splint    Time 3   Period Weeks   Status New           OT Long Term Goals - 10/31/16 1753      OT LONG TERM GOAL #1   Title Pt to verbalize 3 joint protection and AE to decrease stress on joints in hands    Baseline very little knowledge   Time 4   Status New     OT LONG TERM GOAL #2   Title Pt show decrease triggering of L thumb  with less than 3 episodes a week to increase use of L hand    Baseline triggering thru out the day since Febr flareup    Time 4   Period Weeks   Status New               Plan - 11/10/16 1542    Clinical Impression Statement Pt still triggering in thumb and tender at A1pulley of thumb R hand - 2nd ionto done this sessions - oval 8 adjusted but composite flexion decrease and DIP flexion    Rehab Potential Fair   Clinical Impairments Affecting Rehab Potential chronic condition    OT Duration 2 weeks   OT Treatment/Interventions Self-care/ADL training;Splinting;Patient/family education;Therapeutic exercises;Manual Therapy;Iontophoresis   Plan assess progress    OT Home Exercise Plan see pt instruction   Consulted and Agree with Plan of Care Patient      Patient will benefit from skilled therapeutic intervention in order to improve the following deficits and impairments:  Decreased coordination, Decreased range of motion, Impaired flexibility, Impaired UE functional use, Decreased knowledge of use of DME, Decreased strength  Visit Diagnosis: Stiffness of left hand, not elsewhere classified  Muscle weakness (generalized)    Problem List Patient Active Problem List   Diagnosis Date Noted  . Crohn's colitis ([place]) 09/04/2015    Clytie Shetley, Gwenette Greet 11/10/2016, 3:45 PM  [place] [facility] [address] [place], [address] Phone: [phone]   Fax:  [phone]  Name: SIBYL MIKULA MRN: [id number] Date of Birth: [birth year]

## 2016-11-11 ENCOUNTER — Ambulatory Visit: Payer: Medicare Other | Admitting: Occupational Therapy

## 2016-11-14 ENCOUNTER — Ambulatory Visit: Payer: Medicare Other | Attending: Rheumatology | Admitting: Occupational Therapy

## 2016-11-14 DIAGNOSIS — M25642 Stiffness of left hand, not elsewhere classified: Secondary | ICD-10-CM | POA: Diagnosis not present

## 2016-11-14 DIAGNOSIS — M6281 Muscle weakness (generalized): Secondary | ICD-10-CM | POA: Insufficient documentation

## 2016-11-14 NOTE — Therapy (Signed)
[place] [facility] [address] [place], [address] Phone: [phone]   Fax:  [phone]  Occupational Therapy Treatment  Patient Details  Name: Nicole Bass MRN: [id number] Date of Birth: Jul 30, 1951 Referring Provider: Jefm Bryant  Encounter Date: 11/14/2016      OT End of Session - 11/14/16 1151    Visit Number 4   Number of Visits 8   Date for OT Re-Evaluation 11/28/16   OT Start Time 1125   OT Stop Time 1230   OT Time Calculation (min) 65 min   Activity Tolerance Patient tolerated treatment well   Behavior During Therapy [facility] for tasks assessed/performed      Past Medical History:  Diagnosis Date  . Anemia   . Arthritis   . Collagen vascular disease ([place])   . Crohn's disease ([place])   . DDD (degenerative disc disease), cervical   . DDD (degenerative disc disease), cervical   . DDD (degenerative disc disease), cervical 2003  . DDD (degenerative disc disease), lumbar   . Degenerative disc disease, cervical   . Difficult intubation     Past Surgical History:  Procedure Laterality Date  . ABDOMINAL HYSTERECTOMY    . APPENDECTOMY    . BACK SURGERY     x 3  . [place]  . CHOLECYSTECTOMY    . COLON SURGERY  1999   removed 18 inches of colon  . DILATION AND CURETTAGE OF UTERUS  2004  . ELBOW FRACTURE SURGERY  2008  . JOINT REPLACEMENT Right 2015   partial knee replacement  . KYPHOPLASTY    . KYPHOPLASTY N/A 04/14/2015   Procedure: KYPHOPLASTY;  Surgeon: Hessie Knows, MD;  Location: [facility];  Service: Orthopedics;  Laterality: N/A;  . KYPHOPLASTY N/A 06/04/2015   Procedure: KYPHOPLASTY L-5;  Surgeon: Hessie Knows, MD;  Location: [facility];  Service: Orthopedics;  Laterality: N/A;  . MEDIAL PARTIAL KNEE REPLACEMENT    . OOPHORECTOMY Bilateral   . SHOULDER ARTHROSCOPY WITH ROTATOR CUFF REPAIR AND SUBACROMIAL DECOMPRESSION Right 04/13/2016   Procedure: SHOULDER ARTHROSCOPY WITH  ROTATOR CUFF REPAIR AND SUBACROMIAL DECOMPRESSION, release of long head biceps tendon;  Surgeon: Leanor Kail, MD;  Location: [facility];  Service: Orthopedics;  Laterality: Right;  . TOE SURGERY  2013  . TONSILLECTOMY    . WRIST FRACTURE SURGERY Bilateral     There were no vitals filed for this visit.      Subjective Assessment - 11/14/16 1126    Subjective  I am using my buddy strap and oval 8 - clicking  of my thumb is better- but my tremors is worse    Patient Stated Goals Want my index finger better and my thumb not to triggering - want to open jars better , pick up small things , squeeze toothpast and grip curling iron    Currently in Pain? No/denies            [facility] OT Assessment - 11/14/16 0001      Left Hand AROM   L Index  MCP 0-90 90 Degrees   L Index PIP 0-100 98 Degrees   L Index DIP 0-70 50 Degrees                  OT Treatments/Exercises (OP) - 11/14/16 0001      Iontophoresis   Type of Iontophoresis Dexamethasone   Location A1pulley at thumb MP    Dose 0.7 current and then increase  2 x - small patch    Time more than 46 min      Assess AROM of 2nd digit - showed improvement at PIP and DIP flexion  Reviewed AROM and place and hold for composite flexion of 2nd and intrinsic fist  See flowsheet  Palpation A1pulley still tender - but report decrease triggering the last day or 2 See ionto on flowsheet              OT Education - 11/14/16 1150    Education provided Yes   Education Details USe buddy strap and oval 8 splint - AROM place and hold - 2nd digit   Person(s) Educated Patient   Methods Demonstration;Tactile cues;Verbal cues;Explanation   Comprehension Verbalized understanding;Returned demonstration;Verbal cues required          OT Short Term Goals - 10/31/16 1752      OT SHORT TERM GOAL #1   Title Pt to be ind in HEP to wear oval 8 splint, increase ROM , increase use of 3 point pinch in ADL's and decrease triggering     Baseline no knowledge or splint    Time 3   Period Weeks   Status New           OT Long Term Goals - 10/31/16 1753      OT LONG TERM GOAL #1   Title Pt to verbalize 3 joint protection and AE to decrease stress on joints in hands    Baseline very little knowledge   Time 4   Status New     OT LONG TERM GOAL #2   Title Pt show decrease triggering of L thumb with less than 3 episodes a week to increase use of L hand    Baseline triggering thru out the day since Febr flareup    Time 4   Period Weeks   Status New               Plan - 11/14/16 1151    Clinical Impression Statement Pt report decrease triggering at L  thumb, increase AROM at R 2nd digit PIP and DIP - pt to cont with using oval 8 and buddy strap - cont ionto with dexamethazone    Rehab Potential Fair   Clinical Impairments Affecting Rehab Potential chronic condition    OT Frequency 2x / week   OT Duration 2 weeks   OT Treatment/Interventions Self-care/ADL training;Splinting;Patient/family education;Therapeutic exercises;Manual Therapy;Iontophoresis   Plan assess progress with triggeirng  and ROM at 2nd digit   OT Home Exercise Plan see pt instruction   Consulted and Agree with Plan of Care Patient      Patient will benefit from skilled therapeutic intervention in order to improve the following deficits and impairments:  Decreased coordination, Decreased range of motion, Impaired flexibility, Impaired UE functional use, Decreased knowledge of use of DME, Decreased strength  Visit Diagnosis: Stiffness of left hand, not elsewhere classified  Muscle weakness (generalized)    Problem List Patient Active Problem List   Diagnosis Date Noted  . Crohn's colitis ([place]) 09/04/2015    Rosalyn Gess OTR/L,CLT 11/14/2016, 3:27 PM  [place] [facility] [address] [place], [address] Phone: [phone]   Fax:  [phone]  Name: Nicole Bass MRN: [id number] Date of Birth: [birth year]

## 2016-11-14 NOTE — Patient Instructions (Addendum)
Same HEP using oval 8 and buddy strap  Place and hold composite flexion - intrinsic fist place and hold

## 2016-11-16 ENCOUNTER — Ambulatory Visit: Payer: Medicare Other | Admitting: Occupational Therapy

## 2016-11-16 DIAGNOSIS — M25642 Stiffness of left hand, not elsewhere classified: Secondary | ICD-10-CM | POA: Diagnosis not present

## 2016-11-16 DIAGNOSIS — M6281 Muscle weakness (generalized): Secondary | ICD-10-CM

## 2016-11-16 NOTE — Therapy (Signed)
[place] [facility] [address], [place], [address] Phone: [phone]   Fax:  [phone]  Occupational Therapy Treatment/discharge  Patient Details  Name: Nicole Bass MRN: [id number] Date of Birth: [birth year] Referring Provider: Jefm Bryant  Encounter Date: 11/16/2016      OT End of Session - 11/16/16 1413    Visit Number 5   Number of Visits 8   Date for OT Re-Evaluation 11/28/16   OT Start Time 1203   OT Stop Time 1258   OT Time Calculation (min) 55 min   Activity Tolerance Patient tolerated treatment well   Behavior During Therapy [facility] for tasks assessed/performed      Past Medical History:  Diagnosis Date  . Anemia   . Arthritis   . Collagen vascular disease ([place])   . Crohn's disease ([place])   . DDD (degenerative disc disease), cervical   . DDD (degenerative disc disease), cervical   . DDD (degenerative disc disease), cervical 2003  . DDD (degenerative disc disease), lumbar   . Degenerative disc disease, cervical   . Difficult intubation     Past Surgical History:  Procedure Laterality Date  . ABDOMINAL HYSTERECTOMY    . APPENDECTOMY    . BACK SURGERY     x 3  . [place]  . CHOLECYSTECTOMY    . COLON SURGERY  1999   removed 18 inches of colon  . DILATION AND CURETTAGE OF UTERUS  2004  . ELBOW FRACTURE SURGERY  2008  . JOINT REPLACEMENT Right 2015   partial knee replacement  . KYPHOPLASTY    . KYPHOPLASTY N/A 04/14/2015   Procedure: KYPHOPLASTY;  Surgeon: Hessie Knows, MD;  Location: [facility];  Service: Orthopedics;  Laterality: N/A;  . KYPHOPLASTY N/A 06/04/2015   Procedure: KYPHOPLASTY L-5;  Surgeon: Hessie Knows, MD;  Location: [facility];  Service: Orthopedics;  Laterality: N/A;  . MEDIAL PARTIAL KNEE REPLACEMENT    . OOPHORECTOMY Bilateral   . SHOULDER ARTHROSCOPY WITH ROTATOR CUFF REPAIR AND SUBACROMIAL DECOMPRESSION Right 04/13/2016   Procedure: SHOULDER  ARTHROSCOPY WITH ROTATOR CUFF REPAIR AND SUBACROMIAL DECOMPRESSION, release of long head biceps tendon;  Surgeon: Leanor Kail, MD;  Location: [facility];  Service: Orthopedics;  Laterality: Right;  . TOE SURGERY  2013  . TONSILLECTOMY    . WRIST FRACTURE SURGERY Bilateral     There were no vitals filed for this visit.      Subjective Assessment - 11/16/16 1219    Subjective  Triggering  is less - but I try to prevent it from triggering - I do wear still some the bandaid- do try and wear the oval 8 and buddy strap    Patient Stated Goals Want my index finger better and my thumb not to triggering - want to open jars better , pick up small things , squeeze toothpast and grip curling iron    Currently in Pain? No/denies             Assess fit of oval 8 - pt still deviating - but less Showed increase flexion at PIP and DIP compare to eval  Pt ed on modifications to increase ROM , use of 3 point grip and avoid lat pinch  Pt to cont with oval 8 and buddy strap as needed  Compensation and modifications to decrease lateral pinch on middle phalanges  Ice massage on A1pulley at thumb and decrease triggering repetitively until MD appt  OT Treatments/Exercises (OP) - 11/16/16 0001      Iontophoresis   Type of Iontophoresis Dexamethasone   Location A1pulley at thumb MP    Dose 0.7 current and then increase 2 x - small patch    Time more than 46 min                OT Education - 11/16/16 1222    Education provided Yes   Education Details to cont to do ice massage/ and prevent triggering - , oval 8/buddy strap -    Person(s) Educated Patient   Methods Explanation;Demonstration;Tactile cues;Verbal cues   Comprehension Verbalized understanding;Returned demonstration;Verbal cues required          OT Short Term Goals - 11/16/16 1416      OT SHORT TERM GOAL #1   Title Pt to be ind in HEP to wear oval 8 splint, increase ROM , increase use of 3 point pinch in  ADL's and decrease triggering    Status Achieved           OT Long Term Goals - 11/16/16 1416      OT LONG TERM GOAL #1   Title Pt to verbalize 3 joint protection and AE to decrease stress on joints in hands    Status Achieved     OT LONG TERM GOAL #2   Title Pt show decrease triggering of L thumb with less than 3 episodes a week to increase use of L hand    Baseline decrease but still daily   Status Not Met               Plan - 11/16/16 1414    Clinical Impression Statement Pt do report triggering at thumb decrease since ionto - but pt has copay of $50 - pt also showing cont deviation out of 2nd PIP - and also to lesser extend on the L - pt was fitted with oval 8 and buddy strap to use-  pt did show increase flexion at PIP and DIP - but still impaired - and ed on modifications of tasks- pt to see MD - will recomment maybe shot for trigger finger and ortho consult for 2nd digit    OT Treatment/Interventions Self-care/ADL training;Splinting;Patient/family education;Therapeutic exercises;Manual Therapy;Iontophoresis   Plan refer back to Dr Jefm Bryant and consult with hand surgeon    OT Home Exercise Plan see pt instruction   Consulted and Agree with Plan of Care Patient      Patient will benefit from skilled therapeutic intervention in order to improve the following deficits and impairments:     Visit Diagnosis: Stiffness of left hand, not elsewhere classified  Muscle weakness (generalized)    Problem List Patient Active Problem List   Diagnosis Date Noted  . Crohn's colitis ([place]) 09/04/2015    Rosalyn Gess OTR/L,CLT 11/16/2016, 2:17 PM  [place] [facility] [address] [place], [address] Phone: [phone]   Fax:  [phone]  Name: Nicole Bass MRN: [id number] Date of Birth: [birth year]

## 2016-11-16 NOTE — Patient Instructions (Signed)
Pt to cont with oval 8 and buddy strap as needed  Compensation and modifications to decrease lateral pinch on middle phalanges  Ice massage on A1pulley at thumb and decrease triggering repetitively until MD appt

## 2016-11-22 ENCOUNTER — Ambulatory Visit
Admission: RE | Admit: 2016-11-22 | Discharge: 2016-11-22 | Disposition: A | Discharge status: Home / Self Care | Payer: Medicare Other | Source: Ambulatory Visit | Attending: Internal Medicine | Admitting: Internal Medicine

## 2016-11-22 DIAGNOSIS — R2689 Other abnormalities of gait and mobility: Secondary | ICD-10-CM | POA: Insufficient documentation

## 2016-11-22 DIAGNOSIS — R251 Tremor, unspecified: Secondary | ICD-10-CM | POA: Diagnosis present

## 2016-11-22 DIAGNOSIS — R1310 Dysphagia, unspecified: Secondary | ICD-10-CM | POA: Diagnosis present

## 2016-11-22 DIAGNOSIS — H538 Other visual disturbances: Secondary | ICD-10-CM | POA: Insufficient documentation

## 2016-11-23 ENCOUNTER — Ambulatory Visit (INDEPENDENT_AMBULATORY_CARE_PROVIDER_SITE_OTHER): Payer: Medicare Other | Admitting: Podiatry

## 2016-11-23 ENCOUNTER — Ambulatory Visit (INDEPENDENT_AMBULATORY_CARE_PROVIDER_SITE_OTHER): Payer: Medicare Other

## 2016-11-23 VITALS — BP 114/88 | HR 128 | Resp 16

## 2016-11-23 DIAGNOSIS — M79671 Pain in right foot: Secondary | ICD-10-CM

## 2016-11-23 DIAGNOSIS — M792 Neuralgia and neuritis, unspecified: Secondary | ICD-10-CM

## 2016-11-23 NOTE — Progress Notes (Signed)
She presents today for a chief complaint of pain to the top of her foot. States that she was recommended to [place] from her rheumatologist. She states that her rheumatologist feels that this is more nerve pain in the knee joint pain. She does have a history of rheumatoid arthritis and is currently taking steroid oral anti-inflammatory as well as methotrexate. She denies any trauma to the right foot. She states the pain is most consistent as it radiates from the dorsal aspect of the foot up the leg almost causing a cramp in the calf. She provided gabapentin in the past which did not help at all.  Objective: Vital signs are stable alert and oriented 3. Patient is in no apparent distress. I have reviewed her past medical history medications out the surgeon's social history.  Pulses are strongly palpable. Neurologic sensorium is intact. She does have tenderness on palpation of the deep peroneal nerve as it courses across Lisfranc's joints and [place] into the first intermetatarsal space. Deep tendon reflexes are intact muscle strength is normal. Orthopedic evaluation demonstrates rectus foot type mild dorsal spurring at the first metatarsal medial cuneiform joint area and this is bilateral right appears to be worse than the left. Radiographs confirm mild dorsal spur as well as spurs to the calcaneus. No fractures no major arthritic changes. Cutaneous evaluation of stress of the well-hydrated cutis notable I no open lesions or wounds.  Assessment: History of rheumatoid arthritis. 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  Plan: Discussed etiology pathology conservative versus surgical therapy provided her with options of injection of corticosteroid which she declined. Also discussed appropriate shoe gear which she will seek. We also discussed the possible need for dehydrated alcohol Alleviate the symptoms upon next visit. She will notify us if needed.

## 2016-12-15 ENCOUNTER — Other Ambulatory Visit
Admission: RE | Admit: 2016-12-15 | Discharge: 2016-12-15 | Disposition: A | Discharge status: Home / Self Care | Payer: Medicare Other | Source: Ambulatory Visit | Attending: Unknown Physician Specialty | Admitting: Unknown Physician Specialty

## 2016-12-15 DIAGNOSIS — Z96651 Presence of right artificial knee joint: Secondary | ICD-10-CM | POA: Insufficient documentation

## 2016-12-15 LAB — SYNOVIAL CELL COUNT + DIFF, W/ CRYSTALS
CRYSTALS FLUID: NONE SEEN
EOSINOPHILS-SYNOVIAL: 0 %
Lymphocytes-Synovial Fld: 80 %
Monocyte-Macrophage-Synovial Fluid: 2 %
NEUTROPHIL, SYNOVIAL: 18 %
Other Cells-SYN: 0
WBC, SYNOVIAL: 8 /mm3 (ref 0–200)

## 2016-12-19 LAB — BODY FLUID CULTURE: Culture: NO GROWTH

## 2017-02-13 DIAGNOSIS — R251 Tremor, unspecified: Secondary | ICD-10-CM | POA: Insufficient documentation

## 2017-02-13 DIAGNOSIS — G629 Polyneuropathy, unspecified: Secondary | ICD-10-CM | POA: Insufficient documentation

## 2017-02-13 DIAGNOSIS — G2581 Restless legs syndrome: Secondary | ICD-10-CM | POA: Insufficient documentation

## 2017-02-21 ENCOUNTER — Ambulatory Visit
Admission: EM | Admit: 2017-02-21 | Discharge: 2017-02-21 | Disposition: A | Discharge status: Home / Self Care | Payer: Medicare Other | Attending: Family Medicine | Admitting: Family Medicine

## 2017-02-21 DIAGNOSIS — G609 Hereditary and idiopathic neuropathy, unspecified: Secondary | ICD-10-CM | POA: Diagnosis not present

## 2017-02-21 DIAGNOSIS — R197 Diarrhea, unspecified: Secondary | ICD-10-CM

## 2017-02-21 DIAGNOSIS — R251 Tremor, unspecified: Secondary | ICD-10-CM | POA: Diagnosis not present

## 2017-02-21 MED ORDER — DIAZEPAM 2 MG PO TABS: 2.0000 mg | ORAL_TABLET | Freq: Two times a day (BID) | ORAL | 0 refills | Status: DC

## 2017-02-21 NOTE — ED Triage Notes (Signed)
66 year old Caucasian woman is here today with her husband with complaints of chills, itch water eyes drainage and left leg shaking. She states the left leg has been shaking involuntarily for the last three weeks. She states the neurology office advised her to go to a walk in because there were not any openings. She states she contacted her PCP as well and was advise the same. She states she has increased on Gabapentin is not having any relief. Her spouse states the allergy symptoms started about three days ago.

## 2017-02-21 NOTE — ED Provider Notes (Signed)
CSN: [id number]     Arrival date & time 02/21/17  1800 History   First MD Initiated Contact with Patient 02/21/17 1901     Chief Complaint  Patient presents with  . Shaking  . Chills   (Consider location/radiation/quality/duration/timing/severity/associated sxs/prior Treatment) HPI  This a 66 year old female who is accompanied by her husband. Is a very complicated history with multiple comorbidities. The patient is complaining of diarrhea she had 6 movements today that is very watery with no blood or mucus. She has no associated nausea vomiting. Leg is been shaking but this is been present for 3 weeks. Was seen her orthopedic surgeon rheumatologist and most recently her neurologist. He started her on gabapentin which she was unable to tolerate higher doses and is taking 300 mg at nighttime presently. Also told her to increase her Requip over the last 3 weeks from an as necessary schedule to every night. She contacted her neurologist today and her PCP who both told her that they didn't have any openings and to go to walk-in clinic. States she's been drinking a great deal of Kool-Aid some water and milk. Although she does not have much of an appetite. Her husband states she is lying around in bed almost all day long and she gets up only to go to the kitchen on occasion into the bathroom. She has had no fever has been shaking which after talking to her appears to be from her pain that she is having mostly in her right leg from her distal thigh into her proximal leg including the knee.        Past Medical History:  Diagnosis Date  . Anemia   . Arthritis   . Collagen vascular disease ([place])   . Crohn's disease ([place])   . DDD (degenerative disc disease), cervical   . DDD (degenerative disc disease), cervical   . DDD (degenerative disc disease), cervical 2003  . DDD (degenerative disc disease), lumbar   . Degenerative disc disease, cervical   . Difficult intubation    Past Surgical History:   Procedure Laterality Date  . ABDOMINAL HYSTERECTOMY    . APPENDECTOMY    . BACK SURGERY     x 3  . [place]  . CHOLECYSTECTOMY    . COLON SURGERY  1999   removed 18 inches of colon  . DILATION AND CURETTAGE OF UTERUS  2004  . ELBOW FRACTURE SURGERY  2008  . JOINT REPLACEMENT Right 2015   partial knee replacement  . KYPHOPLASTY    . KYPHOPLASTY N/A 04/14/2015   Procedure: KYPHOPLASTY;  Surgeon: Hessie Knows, MD;  Location: [facility] ORS;  Service: Orthopedics;  Laterality: N/A;  . KYPHOPLASTY N/A 06/04/2015   Procedure: KYPHOPLASTY L-5;  Surgeon: Hessie Knows, MD;  Location: [facility] ORS;  Service: Orthopedics;  Laterality: N/A;  . MEDIAL PARTIAL KNEE REPLACEMENT    . OOPHORECTOMY Bilateral   . SHOULDER ARTHROSCOPY WITH ROTATOR CUFF REPAIR AND SUBACROMIAL DECOMPRESSION Right 04/13/2016   Procedure: SHOULDER ARTHROSCOPY WITH ROTATOR CUFF REPAIR AND SUBACROMIAL DECOMPRESSION, release of long head biceps tendon;  Surgeon: Leanor Kail, MD;  Location: [facility] ORS;  Service: Orthopedics;  Laterality: Right;  . TOE SURGERY  2013  . TONSILLECTOMY    . WRIST FRACTURE SURGERY Bilateral    Family History  Problem Relation Age of Onset  . Diabetes Mother   . Emphysema Mother   . Emphysema Father   . Breast cancer Neg Hx    Social History  Substance  Use Topics  . Smoking status: Never Smoker  . Smokeless tobacco: Never Used  . Alcohol use No   OB History    No data available     Review of Systems  Constitutional: Positive for activity change, appetite change and chills. Negative for fatigue and fever.  Gastrointestinal: Positive for diarrhea.  Musculoskeletal: Positive for myalgias.  Neurological: Positive for tremors.  All other systems reviewed and are negative.   Allergies  Sulfa antibiotics; Cefazolin; Cephalosporins; Enbrel [etanercept]; Humira [adalimumab]; Levaquin [levofloxacin in d5w]; Lorabid [loracarbef]; Meropenem; Norco [hydrocodone-acetaminophen]; and  Primidone  Home Medications   Prior to Admission medications   Medication Sig Start Date End Date Taking? Authorizing Provider  aspirin EC 81 MG tablet Take 81 mg by mouth daily.   Yes [provider]  Biotin 1000 MCG tablet Take 1,000 mcg by mouth daily.   Yes [provider]  Calcium-Vitamin D-Vitamin K (VIACTIV PO) Take 2 Doses by mouth every evening.   Yes [provider]  cetirizine (ZYRTEC) 10 MG tablet Take 10 mg by mouth daily.   Yes [provider]  clonazePAM (KLONOPIN) 1 MG tablet Take 1 mg by mouth at bedtime as needed (for sleep).    Yes [provider]  cyanocobalamin (,VITAMIN B-12,) 1000 MCG/ML injection Inject 1,000 mcg into the muscle every 30 (thirty) days.   Yes [provider]  Cyanocobalamin (VITAMIN B-12) 6000 MCG SUBL Place 6,000 mcg under the tongue daily.   Yes [provider]  folic acid (FOLVITE) 1 MG tablet Take 1 mg by mouth daily.   Yes [provider]  furosemide (LASIX) 20 MG tablet Take 20 mg by mouth every morning.    Yes [provider]  magnesium oxide (MAG-OX) 400 MG tablet Take 400 mg by mouth daily.    Yes [provider]  METHOTREXATE SODIUM IJ Inject 8 mg as directed once a week. Subcutaneous on Sundays   Yes [provider]  Multiple Vitamins-Minerals (MULTIVITAMIN WITH MINERALS) tablet Take 1 tablet by mouth daily.   Yes [provider]  omeprazole (PRILOSEC) 40 MG capsule Take 40 mg by mouth every morning.    Yes [provider]  potassium chloride SA (K-DUR,KLOR-CON) 20 MEQ tablet Take 20 mEq by mouth daily.    Yes [provider]  predniSONE (STERAPRED UNI-PAK 21 TAB) 10 MG (21) TBPK tablet Dispense steroid taper pack as directed 09/15/16  Yes Earleen Newport, MD  Probiotic Product (PROBIOTIC PO) Take 2 tablets by mouth every morning.   Yes [provider]  rOPINIRole (REQUIP) 0.5 MG tablet Take 0.5 mg by mouth  at bedtime as needed (for restless legs).    Yes [provider]  sucralfate (CARAFATE) 1 g tablet Take 1 g by mouth 3 (three) times daily with meals as needed (for stomach cramping).   Yes [provider]  tapentadol (NUCYNTA) 50 MG tablet Take 50 mg by mouth every 6 (six) hours as needed.   Yes [provider]  diazepam (VALIUM) 2 MG tablet Take 1 tablet (2 mg total) by mouth 2 (two) times daily. 02/21/17   Lorin Picket, PA-C  diphenoxylate-atropine (LOMOTIL) 2.5-0.025 MG per tablet Take 2 tablets by mouth every morning.     [provider]  oxyCODONE-acetaminophen (PERCOCET) 7.5-325 MG tablet Take 1 tablet by mouth every 4 (four) hours as needed for severe pain. 09/15/16 09/15/17  Earleen Newport, MD   Meds Ordered and Administered this Visit  Medications -  No data to display  BP (!) 108/47 (BP Location: Right Arm)   Pulse 83   Temp 98.4 F (36.9 C) (Oral)   Resp 18   Ht 5' 1"  (1.549 m)   Wt 173 lb (78.5 kg)   SpO2 96%   BMI 32.69 kg/m  No data found.   Physical Exam  Constitutional: She is oriented to person, place, and time. She appears well-developed and well-nourished. No distress.  HENT:  Head: Normocephalic.  Eyes: EOM are normal. Pupils are equal, round, and reactive to light. Right eye exhibits no discharge. Left eye exhibits no discharge.  Neck: Normal range of motion. Neck supple.  Abdominal: Soft. Bowel sounds are normal.  Musculoskeletal: She exhibits tenderness.  Neurological: She is alert and oriented to person, place, and time.  Skin: Skin is warm and dry. She is not diaphoretic.  Psychiatric: She has a normal mood and affect. Her behavior is normal. Judgment and thought content normal.  Nursing note and vitals reviewed.   Urgent Care Course     Procedures (including critical care time)  Labs Review Labs Reviewed - No data to display  Imaging Review No results found.   Visual Acuity Review  Right Eye  Distance:   Left Eye Distance:   Bilateral Distance:    Right Eye Near:   Left Eye Near:    Bilateral Near:         MDM   1. Diarrhea, unspecified type   2. Idiopathic peripheral neuropathy    Discharge Medication List as of 02/21/2017  7:37 PM    START taking these medications   Details  diazepam (VALIUM) 2 MG tablet Take 1 tablet (2 mg total) by mouth 2 (two) times daily., Starting Tue 02/21/2017, Print      Plan: 1. Test/x-ray results and diagnosis reviewed with patient 2. rx as per orders; risks, benefits, potential side effects reviewed with patient 3. Recommend supportive treatment with Discontinuing her Requip tonight as this may be the cause of her diarrhea. She is having spasm type pain in her leg and because this I will prescribe Valium for tonight. She will contact her neurologist tomorrow to discuss this further with him. Unfortunately  I don't much to offer her tonight other than encouragement and recommendations to increase her fluid intake and may consider taking Imodium to help stop the diarrhea. Does not appear to be of any infectious origin. She will contact her neurologist tomorrow for further instructions. If she worsens tonight I have advised her husband to take her immediately to the emergency department. 4. F/u prn if symptoms worsen or don't improve     Lorin Picket, PA-C 02/21/17 2105

## 2017-03-01 ENCOUNTER — Other Ambulatory Visit: Payer: Self-pay | Admitting: Internal Medicine

## 2017-03-01 DIAGNOSIS — M79605 Pain in left leg: Secondary | ICD-10-CM

## 2017-03-02 ENCOUNTER — Encounter: Payer: Self-pay | Admitting: Obstetrics and Gynecology

## 2017-03-02 ENCOUNTER — Other Ambulatory Visit: Payer: Self-pay | Admitting: Rheumatology

## 2017-03-02 DIAGNOSIS — M79605 Pain in left leg: Secondary | ICD-10-CM

## 2017-03-03 ENCOUNTER — Ambulatory Visit
Admission: RE | Admit: 2017-03-03 | Discharge: 2017-03-03 | Disposition: A | Discharge status: Home / Self Care | Payer: Medicare Other | Source: Ambulatory Visit | Attending: Internal Medicine | Admitting: Internal Medicine

## 2017-03-03 DIAGNOSIS — M0579 Rheumatoid arthritis with rheumatoid factor of multiple sites without organ or systems involvement: Secondary | ICD-10-CM | POA: Diagnosis not present

## 2017-03-03 DIAGNOSIS — M79605 Pain in left leg: Secondary | ICD-10-CM | POA: Insufficient documentation

## 2017-03-07 ENCOUNTER — Ambulatory Visit: Payer: Medicare Other | Attending: Rheumatology

## 2017-03-07 DIAGNOSIS — G8929 Other chronic pain: Secondary | ICD-10-CM | POA: Diagnosis present

## 2017-03-07 DIAGNOSIS — M25562 Pain in left knee: Secondary | ICD-10-CM | POA: Diagnosis not present

## 2017-03-07 DIAGNOSIS — M6281 Muscle weakness (generalized): Secondary | ICD-10-CM | POA: Diagnosis present

## 2017-03-07 DIAGNOSIS — R262 Difficulty in walking, not elsewhere classified: Secondary | ICD-10-CM | POA: Insufficient documentation

## 2017-03-07 DIAGNOSIS — M25561 Pain in right knee: Secondary | ICD-10-CM | POA: Insufficient documentation

## 2017-03-07 NOTE — Therapy (Signed)
[facility] [address] [place], [address] Phone: [phone]   Fax:  [phone]  Physical Therapy Evaluation  Patient Details  Name: Nicole Bass MRN: [id number] Date of Birth: Apr 18, 1951 Referring Provider: [name]. MD  Encounter Date: 03/07/2017      PT End of Session - 03/07/17 1448    Visit Number 1   Number of Visits 13   Date for PT Re-Evaluation 04/20/17   Authorization Type 1   Authorization Time Period of 10 g-code   PT Start Time 1448   PT Stop Time 1616   PT Time Calculation (min) 88 min   Activity Tolerance Patient limited by pain   Behavior During Therapy [facility] for tasks assessed/performed      Past Medical History:  Diagnosis Date  . Anemia   . Arthritis   . Collagen vascular disease ([place])   . Crohn's disease ([place])   . DDD (degenerative disc disease), cervical   . DDD (degenerative disc disease), cervical   . DDD (degenerative disc disease), cervical 2003  . DDD (degenerative disc disease), lumbar   . Degenerative disc disease, cervical   . Difficult intubation     Past Surgical History:  Procedure Laterality Date  . ABDOMINAL HYSTERECTOMY    . APPENDECTOMY    . BACK SURGERY     x 3  . [place]  . CHOLECYSTECTOMY    . COLON SURGERY  1999   removed 18 inches of colon  . DILATION AND CURETTAGE OF UTERUS  2004  . ELBOW FRACTURE SURGERY  2008  . JOINT REPLACEMENT Right 2015   partial knee replacement  . KYPHOPLASTY    . KYPHOPLASTY N/A 04/14/2015   Procedure: KYPHOPLASTY;  Surgeon: Hessie Knows, MD;  Location: [facility] ORS;  Service: Orthopedics;  Laterality: N/A;  . KYPHOPLASTY N/A 06/04/2015   Procedure: KYPHOPLASTY L-5;  Surgeon: Hessie Knows, MD;  Location: [facility] OR[facility];  Service: Orthopedics;  Laterality: N/A;  . MEDIAL PARTIAL KNEE REPLACEMENT    . OOPHORECTOMY Bilateral   . SHOULDER ARTHROSCOPY WITH ROTATOR CUFF REPAIR AND  SUBACROMIAL DECOMPRESSION Right 04/13/2016   Procedure: SHOULDER ARTHROSCOPY WITH ROTATOR CUFF REPAIR AND SUBACROMIAL DECOMPRESSION, release of long head biceps tendon;  Surgeon: Leanor Kail, MD;  Location: [facility] OR[facility];  Service: Orthopedics;  Laterality: Right;  . TOE SURGERY  2013  . TONSILLECTOMY    . WRIST FRACTURE SURGERY Bilateral     There were no vitals filed for this visit.       Subjective Assessment - 03/07/17 1454    Subjective L knee: 7/10 currently (pt sitting on a chair), 2/10 at best (sitting on her couch or sidelying on her bed with pillow between knees), 10/10 at worst (very early in the morning when pt first gets up). R knee 0/10 currently and at best, 8/10 at worst for the past month    Pertinent History L leg and chronic bilateral knee pain L > R. Feels pain at the L knee joint (anterior and posterior) as well as anterior L leg and thigh. Also feels pain in her R knee joint (also has a partial medial knee replacement).  Pt also states having RA. L knee has started bothering her since the first week of July 2018, gradual onset. Pt initally thought that it was an arthritis flare. Went to her arthritis doctor who sent her to an orthopedic who thinks that her knee is due  to arthritis. Has been in the ER and seen multiple doctors for he L knee.   Denies loss of bowel or bladder control or saddle anesthesia. Feels pain in both the front and back or her L thigh and leg.  Has to go down steps sideways.  Had an x-ray for her L knee at the end of June 2018 and her MD thinks her knee pain is from arthritis.  Getting an MRI for her back and her L knee tomorrow.   Pt also adds that her L knee buckles at times.    Patient Stated Goals Walk with no pain.   Currently in Pain? Yes   Pain Score 7    Pain Location Knee   Pain Orientation Left;Right   Pain Descriptors / Indicators Throbbing   Pain Type Chronic pain   Pain Onset More than a month ago   Pain Frequency Constant   Aggravating  Factors  standing up from a seated position, walking (about 2 minutes is her max, walking in general bothers her L knee), crossing her legs, squatting, transfers, and stair negotiation.   Some movement with her rw.    Pain Relieving Factors heat, S/L with pillow between knees; sitting on her recliner hurts but her pain level is lower than baseline after she does not prop her leg up afterwards.  Sitting on a couch, S/L with pillow between knees            [facility] PT Assessment - 03/07/17 1450      Assessment   Medical Diagnosis Chronic L and R knee pain. Rheumatoid arthritis involving multiple sites   Referring Provider [name]. MD   Onset Date/Surgical Date 02/12/17   Prior Therapy No known PT for current condition     Precautions   Precaution Comments hx of lumbar compression fracture     Balance Screen   Has the patient fallen in the past 6 months No   Has the patient had a decrease in activity level because of a fear of falling?  Yes   Is the patient reluctant to leave their home because of a fear of falling?  No     Home Environment   Additional Comments Pt lives in a 2 story home with her husband. 3 steps to enter, bilateral rail. 12 steps to get to second floor, bilateral rail. First floor set up.      Prior Function   Vocation Requirements PLOF: better able to ambulate, perform transfers, stair negotiation, standing tasks with less knee pain     Observation/Other Assessments   Lower Extremity Functional Scale  25/80     Posture/Postural Control   Posture Comments decreased bilateral knee extension, bilaterally protracted shoulders and neck     AROM   Overall AROM Comments seated knee AROM, back unsupported.      Right Knee Extension -18  posterior knee pain   Left Knee Extension -25  posterior knee pain   Lumbar Flexion WFL   Lumbar Extension limited   Lumbar - Right Side Bend WFL   Lumbar - Left Side [facility] with L lateral knee joint pain   Lumbar -  Right Rotation WFL   Lumbar - Left Rotation [facility]     Strength   Right Hip Flexion 4/5   Right Hip ABduction 4/5   Left Hip Flexion 4-/5   Left Hip ABduction 4-/5   Right Knee Flexion 4+/5   Right Knee Extension 5/5   Left Knee  Flexion 4/5   Left Knee Extension 4+/5  L posterior knee pain     Palpation   Palpation comment R lumbar paraspinal more palpable than L.      Ambulation/Gait   Gait Comments antalgic, decreased stance L LE, decreased bilateral knee extension during stance phase L > R, lateral lean during stance phase of gait L > R, decreased bilateral hip extension.             Objective measurements completed on examination: See above findings.     Objectives  Manual therapy  Gentle IR of L tibia grade 1 to 3 - -. Fine at first but pain with increased time.   Gentle ER of L tibia grade 1. Not comfortable   moist heat 10 min for pain. Symptoms eased a little bit.    Pt was recommended to use a rw due to hx of L knee buckling. Pt verbalized understanding  High Volt E-stim L knee at 75 V x 20 minutes for pain control   Channel 1: medial and lateral knee 75 V x 20 min  Channel 2: superior and inferior knee 95 V x 12 min   Decreased L knee pain after e-stim          PT Education - 03/07/17 1610    Education provided Yes   Education Details plan of care   Person(s) Educated Patient   Methods Explanation;Demonstration;Tactile cues;Verbal cues   Comprehension Returned demonstration;Verbalized understanding             PT Long Term Goals - 03/07/17 1910      PT LONG TERM GOAL #1   Title Patient will have a decrease in L knee pain to 6/10 or less at worst to promote ability to ambulate, perform transfers, functional tasks.    Baseline 10/10 L knee pain at worst (03/07/2017)   Time 6   Period Weeks   Status New   Target Date 04/20/17     PT LONG TERM GOAL #2   Title Patient will have a decrease in R knee pain to 4/10 or less at worst to  promote ability to ambulate, perform transfers, functional tasks.    Baseline 8/10 R knee pain at worst (03/07/2017)   Time 6   Period Weeks   Status New   Target Date 04/20/17     PT LONG TERM GOAL #3   Title Patient will improve her LEFS score by at least 9 points as a demonstration of improved function.    Baseline 25/80 (03/07/2017)   Time 6   Period Weeks   Status New   Target Date 04/20/17     PT LONG TERM GOAL #4   Title Patient will improve L knee extension AROM to -18 degrees or less to promote ability to ambulate and perform standing tasks with less pain.    Baseline -25 degrees seated L knee extension AROM (03/07/2017)   Time 6   Period Weeks   Status New   Target Date 04/20/17     PT LONG TERM GOAL #5   Title Patient will improve bilateral LE strength by at least 1/2 MMT grade to promote ability to perform functional tasks with less knee pain   Time 6   Period Weeks   Status New   Target Date 04/20/17     Additional Long Term Goals   Additional Long Term Goals Yes     PT LONG TERM GOAL #6   Title Patient will be  able to ambulate at least 200 ft without AD with overall decreased report of L knee pain   Baseline L knee pain after walking a few feet (03/07/2017)   Time 6   Period Weeks   Status New   Target Date 04/20/17                Plan - 03/07/17 1610    Clinical Impression Statement Patient is a 66 year old female who came to physical therapy secondary to bilateral knee pain L > R. She also presents with altered gait pattern and posture, limited bilateral knee extension L > R, bilateral LE weakness L > R, and difficulty performing functional tasks such as walking, performing transfers, and squatting. Patient will benefit from skilled physical therapy to address the aforementioned deficits.    History and Personal Factors relevant to plan of care: Hx of RA, chronicity of condition. worsening pain   Clinical Presentation Evolving   Clinical Presentation  due to: L knee pain slowly getting worse per pt. Weakness. Difficulty with transfers, walking, stair negotiation, perform functional tasks in standing.  Hx of RA   Clinical Decision Making Moderate   Rehab Potential Fair   Clinical Impairments Affecting Rehab Potential Chronicity of condition   PT Frequency 2x / week   PT Duration 6 weeks   PT Treatment/Interventions Aquatic Therapy;Ultrasound;Moist Heat;Iontophoresis 83m/ml Dexamethasone;Electrical Stimulation;Neuromuscular re-education;Therapeutic exercise;Therapeutic activities;Patient/family education;Manual techniques;Dry needling   PT Next Visit Plan gentle soft tissue work, strengthening, knee extension ROM   Consulted and Agree with Plan of Care Patient      Patient will benefit from skilled therapeutic intervention in order to improve the following deficits and impairments:  Pain, Postural dysfunction, Improper body mechanics, Decreased strength, Difficulty walking, Decreased range of motion  Visit Diagnosis: Chronic pain of left knee - Plan: PT plan of care cert/re-cert  Chronic pain of right knee - Plan: PT plan of care cert/re-cert  Difficulty in walking, not elsewhere classified - Plan: PT plan of care cert/re-cert  Muscle weakness (generalized) - Plan: PT plan of care cert/re-cert      G-Codes - [id number]/68/861921    Functional Assessment Tool Used (Outpatient Only) LEFS, clinical presentation, patient interview   Functional Limitation Mobility: Walking and moving around   Mobility: Walking and Moving Around Current Status ([phone] At least 60 percent but less than 80 percent impaired, limited or restricted   Mobility: Walking and Moving Around Goal Status [phone] At least 20 percent but less than 40 percent impaired, limited or restricted       Problem List Patient Active Problem List   Diagnosis Date Noted  . Crohn's colitis ([place] 09/04/2015   MJoneen BoersPT, DPT   03/07/2017, 7:43 PM  [facility] [address] [place] [address]Phone: [phone]  Fax:  [phone] Name: DNAOMEE NOWLANDMRN: [id number] of Birth: 218-Sep-1952

## 2017-03-08 ENCOUNTER — Ambulatory Visit
Admission: RE | Admit: 2017-03-08 | Discharge: 2017-03-08 | Disposition: A | Discharge status: Home / Self Care | Payer: Medicare Other | Source: Ambulatory Visit | Attending: Rheumatology | Admitting: Rheumatology

## 2017-03-08 DIAGNOSIS — M4856XD Collapsed vertebra, not elsewhere classified, lumbar region, subsequent encounter for fracture with routine healing: Secondary | ICD-10-CM | POA: Diagnosis not present

## 2017-03-08 DIAGNOSIS — M48061 Spinal stenosis, lumbar region without neurogenic claudication: Secondary | ICD-10-CM | POA: Diagnosis not present

## 2017-03-08 DIAGNOSIS — M0579 Rheumatoid arthritis with rheumatoid factor of multiple sites without organ or systems involvement: Secondary | ICD-10-CM | POA: Insufficient documentation

## 2017-03-08 DIAGNOSIS — M79605 Pain in left leg: Secondary | ICD-10-CM | POA: Insufficient documentation

## 2017-03-08 DIAGNOSIS — M5136 Other intervertebral disc degeneration, lumbar region: Secondary | ICD-10-CM | POA: Insufficient documentation

## 2017-03-14 ENCOUNTER — Ambulatory Visit: Payer: Medicare Other

## 2017-03-16 ENCOUNTER — Ambulatory Visit: Payer: Medicare Other | Attending: Rheumatology

## 2017-03-16 DIAGNOSIS — M25561 Pain in right knee: Secondary | ICD-10-CM | POA: Insufficient documentation

## 2017-03-16 DIAGNOSIS — G8929 Other chronic pain: Secondary | ICD-10-CM | POA: Insufficient documentation

## 2017-03-16 DIAGNOSIS — M6281 Muscle weakness (generalized): Secondary | ICD-10-CM | POA: Insufficient documentation

## 2017-03-16 DIAGNOSIS — R262 Difficulty in walking, not elsewhere classified: Secondary | ICD-10-CM | POA: Insufficient documentation

## 2017-03-16 DIAGNOSIS — M25562 Pain in left knee: Secondary | ICD-10-CM | POA: Insufficient documentation

## 2017-03-21 ENCOUNTER — Ambulatory Visit: Payer: Medicare Other

## 2017-03-21 ENCOUNTER — Telehealth: Payer: Self-pay

## 2017-03-21 NOTE — Telephone Encounter (Signed)
No show. Called and left a message pertaining to today's appointment and a reminder for her next follow up session. Return phone call requested. Phone number [phone] provided.

## 2017-03-23 ENCOUNTER — Ambulatory Visit: Payer: Medicare Other

## 2017-03-23 DIAGNOSIS — G8929 Other chronic pain: Secondary | ICD-10-CM

## 2017-03-23 DIAGNOSIS — R262 Difficulty in walking, not elsewhere classified: Secondary | ICD-10-CM

## 2017-03-23 DIAGNOSIS — M25561 Pain in right knee: Secondary | ICD-10-CM | POA: Diagnosis present

## 2017-03-23 DIAGNOSIS — M6281 Muscle weakness (generalized): Secondary | ICD-10-CM | POA: Diagnosis present

## 2017-03-23 DIAGNOSIS — M25562 Pain in left knee: Principal | ICD-10-CM

## 2017-03-23 NOTE — Therapy (Signed)
[facility] [address] [place], [address] Phone: [phone]   Fax:  [phone]  Physical Therapy Treatment  Patient Details  Name: Nicole Bass MRN: [id number] Date of Birth: 1951-08-02 Referring Provider: [name]. MD  Encounter Date: 03/23/2017      PT End of Session - 03/23/17 1427    Visit Number 2   Number of Visits 13   Date for PT Re-Evaluation 04/20/17   Authorization Type 2   Authorization Time Period of 10 g-code   PT Start Time 1428  pt arrived late   PT Stop Time 1518   PT Time Calculation (min) 50 min   Activity Tolerance Patient limited by pain   Behavior During Therapy [facility] for tasks assessed/performed      Past Medical History:  Diagnosis Date  . Anemia   . Arthritis   . Collagen vascular disease ([place])   . Crohn's disease ([place])   . DDD (degenerative disc disease), cervical   . DDD (degenerative disc disease), cervical   . DDD (degenerative disc disease), cervical 2003  . DDD (degenerative disc disease), lumbar   . Degenerative disc disease, cervical   . Difficult intubation     Past Surgical History:  Procedure Laterality Date  . ABDOMINAL HYSTERECTOMY    . APPENDECTOMY    . BACK SURGERY     x 3  . [place]  . CHOLECYSTECTOMY    . COLON SURGERY  1999   removed 18 inches of colon  . DILATION AND CURETTAGE OF UTERUS  2004  . ELBOW FRACTURE SURGERY  2008  . JOINT REPLACEMENT Right 2015   partial knee replacement  . KYPHOPLASTY    . KYPHOPLASTY N/A 04/14/2015   Procedure: KYPHOPLASTY;  Surgeon: Hessie Knows, MD;  Location: [facility] ORS;  Service: Orthopedics;  Laterality: N/A;  . KYPHOPLASTY N/A 06/04/2015   Procedure: KYPHOPLASTY L-5;  Surgeon: Hessie Knows, MD;  Location: [facility] OR[facility];  Service: Orthopedics;  Laterality: N/A;  . MEDIAL PARTIAL KNEE REPLACEMENT    . OOPHORECTOMY Bilateral   . SHOULDER ARTHROSCOPY WITH ROTATOR CUFF  REPAIR AND SUBACROMIAL DECOMPRESSION Right 04/13/2016   Procedure: SHOULDER ARTHROSCOPY WITH ROTATOR CUFF REPAIR AND SUBACROMIAL DECOMPRESSION, release of long head biceps tendon;  Surgeon: Leanor Kail, MD;  Location: [facility] ORS;  Service: Orthopedics;  Laterality: Right;  . TOE SURGERY  2013  . TONSILLECTOMY    . WRIST FRACTURE SURGERY Bilateral     There were no vitals filed for this visit.      Subjective Assessment - 03/23/17 1429    Subjective Was sick with sores in her mouth. All but one is gone. Had a small fever but its gone now. Not contagious. Has Crohn's and her doctor thinks that it could have been a reaction from her Crohn's. Had a nerve conduction test yesterday. Her MD thinks she has neuropathy and pinched nerves, even in her lower back.  L knee is hurting right now, 6/10 currently (joint and medial knee pain).    Pertinent History L leg and chronic bilateral knee pain L > R. Feels pain at the L knee joint (anterior and posterior) as well as anterior L leg and thigh. Also feels pain in her R knee joint (also has a partial medial knee replacement).  Pt also states having RA. L knee has started bothering her since the first week of July 2018, gradual onset. Pt initally thought that  it was an arthritis flare. Went to her arthritis doctor who sent her to an orthopedic who thinks that her knee is due to arthritis. Has been in the ER and seen multiple doctors for he L knee.   Denies loss of bowel or bladder control or saddle anesthesia. Feels pain in both the front and back or her L thigh and leg.  Has to go down steps sideways.  Had an x-ray for her L knee at the end of June 2018 and her MD thinks her knee pain is from arthritis.  Getting an MRI for her back and her L knee tomorrow.   Pt also adds that her L knee buckles at times.    Patient Stated Goals Walk with no pain.   Currently in Pain? Yes   Pain Score 6    Pain Onset More than a month ago                                  PT Education - 03/23/17 1451    Education provided Yes   Education Details ther-ex   [employer]) Educated Patient   Methods Explanation;Demonstration;Tactile cues;Verbal cues   Comprehension Returned demonstration;Verbalized understanding        Objectives  Manual therapy   STM L quadriceps in sitting  Decreased L knee pain to 4/10 in sitting  There-ex  Seated hip adduction ball squeeze with glute max squeeze 10x5 seconds  Then with gentle knee extension isometrics 5x5 seconds for 3 sets   Seated L knee flexion/extension AAROM with small 1 kg ball 10x 2 second holds for 2 sets   Sitting on dyna disc with upright posture 10 seconds x 5 to promote trunk muscle use.    Improved exercise technique, movement at target joints, use of target muscles after min to mod verbal, visual, tactile cues.     High Volt E-stim x 15 minutes   chanel 1: medial and lateral L knee 160 V for pain control    Decreased knee pain   Decreased L knee pain to 4/10 in sitting after manual therapy. Worked on gentle knee extension ROM and hip and gentle quadriceps strengthening to help with knee pain with walking. Pt tolerated session well without aggravation of symptoms.        PT Long Term Goals - 03/07/17 1910      PT LONG TERM GOAL #1   Title Patient will have a decrease in L knee pain to 6/10 or less at worst to promote ability to ambulate, perform transfers, functional tasks.    Baseline 10/10 L knee pain at worst (03/07/2017)   Time 6   Period Weeks   Status New   Target Date 04/20/17     PT LONG TERM GOAL #2   Title Patient will have a decrease in R knee pain to 4/10 or less at worst to promote ability to ambulate, perform transfers, functional tasks.    Baseline 8/10 R knee pain at worst (03/07/2017)   Time 6   Period Weeks   Status New   Target Date 04/20/17     PT LONG TERM GOAL #3   Title Patient will improve her LEFS score  by at least 9 points as a demonstration of improved function.    Baseline 25/80 (03/07/2017)   Time 6   Period Weeks   Status New   Target Date 04/20/17     PT LONG  TERM GOAL #4   Title Patient will improve L knee extension AROM to -18 degrees or less to promote ability to ambulate and perform standing tasks with less pain.    Baseline -25 degrees seated L knee extension AROM (03/07/2017)   Time 6   Period Weeks   Status New   Target Date 04/20/17     PT LONG TERM GOAL #5   Title Patient will improve bilateral LE strength by at least 1/2 MMT grade to promote ability to perform functional tasks with less knee pain   Time 6   Period Weeks   Status New   Target Date 04/20/17     Additional Long Term Goals   Additional Long Term Goals Yes     PT LONG TERM GOAL #6   Title Patient will be able to ambulate at least 200 ft without AD with overall decreased report of L knee pain   Baseline L knee pain after walking a few feet (03/07/2017)   Time 6   Period Weeks   Status New   Target Date 04/20/17               Plan - 03/23/17 1826    Clinical Impression Statement Decreased L knee pain to 4/10 in sitting after manual therapy. Worked on gentle knee extension ROM and hip and gentle quadriceps strengthening to help with knee pain with walking. Pt tolerated session well without aggravation of symptoms.    History and Personal Factors relevant to plan of care: Hx of RA, chronicity of condition. Difficulty wiht transfers, walking, stair negotiation, performing functional tasks in standing.    Clinical Presentation Stable   Clinical Presentation due to: Decreased resting knee pain after session   Clinical Decision Making Low   Rehab Potential Fair   Clinical Impairments Affecting Rehab Potential Chronicity of condition   PT Frequency 2x / week   PT Duration 6 weeks   PT Treatment/Interventions Aquatic Therapy;Ultrasound;Moist Heat;Iontophoresis 64m/ml Dexamethasone;Electrical  Stimulation;Neuromuscular re-education;Therapeutic exercise;Therapeutic activities;Patient/family education;Manual techniques;Dry needling   PT Next Visit Plan gentle soft tissue work, strengthening, knee extension ROM   Consulted and Agree with Plan of Care Patient      Patient will benefit from skilled therapeutic intervention in order to improve the following deficits and impairments:  Pain, Postural dysfunction, Improper body mechanics, Decreased strength, Difficulty walking, Decreased range of motion  Visit Diagnosis: Chronic pain of left knee  Chronic pain of right knee  Difficulty in walking, not elsewhere classified  Muscle weakness (generalized)     Problem List Patient Active Problem List   Diagnosis Date Noted  . Crohn's colitis ([place] 09/04/2015    MJoneen BoersPT, DPT   03/23/2017, 6:34 PM  [facility] [address] [place] [address]Phone: [phone]  Fax:  [phone] Name: Nicole POSNERMRN: [id number] of Birth: 226-May-1952

## 2017-03-27 ENCOUNTER — Other Ambulatory Visit: Payer: Self-pay | Admitting: Internal Medicine

## 2017-03-27 DIAGNOSIS — Z1231 Encounter for screening mammogram for malignant neoplasm of breast: Secondary | ICD-10-CM

## 2017-03-28 ENCOUNTER — Ambulatory Visit: Payer: Medicare Other

## 2017-03-28 DIAGNOSIS — R262 Difficulty in walking, not elsewhere classified: Secondary | ICD-10-CM

## 2017-03-28 DIAGNOSIS — G8929 Other chronic pain: Secondary | ICD-10-CM

## 2017-03-28 DIAGNOSIS — M25562 Pain in left knee: Principal | ICD-10-CM

## 2017-03-28 DIAGNOSIS — M6281 Muscle weakness (generalized): Secondary | ICD-10-CM

## 2017-03-28 NOTE — Therapy (Signed)
[facility] [address] [place], [address] Phone: [phone]   Fax:  [phone]  Physical Therapy Treatment  Patient Details  Name: Nicole Bass MRN: [id number] Date of Birth: May 07, 1951 Referring Provider: [name]. MD  Encounter Date: 03/28/2017      PT End of Session - 03/28/17 1637    Visit Number 3   Number of Visits 13   Date for PT Re-Evaluation 04/20/17   Authorization Type 3   Authorization Time Period of 10 g-code   PT Start Time 1637   PT Stop Time 1739   PT Time Calculation (min) 62 min   Activity Tolerance Patient limited by pain   Behavior During Therapy [facility] for tasks assessed/performed      Past Medical History:  Diagnosis Date  . Anemia   . Arthritis   . Collagen vascular disease ([place])   . Crohn's disease ([place])   . DDD (degenerative disc disease), cervical   . DDD (degenerative disc disease), cervical   . DDD (degenerative disc disease), cervical 2003  . DDD (degenerative disc disease), lumbar   . Degenerative disc disease, cervical   . Difficult intubation     Past Surgical History:  Procedure Laterality Date  . ABDOMINAL HYSTERECTOMY    . APPENDECTOMY    . BACK SURGERY     x 3  . [place]  . CHOLECYSTECTOMY    . COLON SURGERY  1999   removed 18 inches of colon  . DILATION AND CURETTAGE OF UTERUS  2004  . ELBOW FRACTURE SURGERY  2008  . JOINT REPLACEMENT Right 2015   partial knee replacement  . KYPHOPLASTY    . KYPHOPLASTY N/A 04/14/2015   Procedure: KYPHOPLASTY;  Surgeon: Hessie Knows, MD;  Location: [facility];  Service: Orthopedics;  Laterality: N/A;  . KYPHOPLASTY N/A 06/04/2015   Procedure: KYPHOPLASTY L-5;  Surgeon: Hessie Knows, MD;  Location: [facility];  Service: Orthopedics;  Laterality: N/A;  . MEDIAL PARTIAL KNEE REPLACEMENT    . OOPHORECTOMY Bilateral   . SHOULDER ARTHROSCOPY WITH ROTATOR CUFF REPAIR AND SUBACROMIAL  DECOMPRESSION Right 04/13/2016   Procedure: SHOULDER ARTHROSCOPY WITH ROTATOR CUFF REPAIR AND SUBACROMIAL DECOMPRESSION, release of long head biceps tendon;  Surgeon: Leanor Kail, MD;  Location: [facility];  Service: Orthopedics;  Laterality: Right;  . TOE SURGERY  2013  . TONSILLECTOMY    . WRIST FRACTURE SURGERY Bilateral     There were no vitals filed for this visit.      Subjective Assessment - 03/28/17 1638    Subjective L knee is hurting. Took a pain pill before coming to PT. Medial L knee pain. 5/10 currently.    Pertinent History L leg and chronic bilateral knee pain L > R. Feels pain at the L knee joint (anterior and posterior) as well as anterior L leg and thigh. Also feels pain in her R knee joint (also has a partial medial knee replacement).  Pt also states having RA. L knee has started bothering her since the first week of July 2018, gradual onset. Pt initally thought that it was an arthritis flare. Went to her arthritis doctor who sent her to an orthopedic who thinks that her knee is due to arthritis. Has been in the ER and seen multiple doctors for he L knee.   Denies loss of bowel or bladder control or saddle anesthesia. Feels pain in both the front and back  or her L thigh and leg.  Has to go down steps sideways.  Had an x-ray for her L knee at the end of June 2018 and her MD thinks her knee pain is from arthritis.  Getting an MRI for her back and her L knee tomorrow.   Pt also adds that her L knee buckles at times.    Patient Stated Goals Walk with no pain.   Currently in Pain? Yes   Pain Score 5    Pain Onset More than a month ago                                 PT Education - 03/28/17 1851    Education provided Yes   Education Details ther-ex, HEP   Person(s) Educated Patient   Methods Explanation;Demonstration;Tactile cues;Verbal cues   Comprehension Returned demonstration;Verbalized understanding        Objectives  Manual  therapy  STM L vastus lateralis in sitting Then to lateral hamstrings  Slight decreased L knee pain  There-ex  Gait throughout session to check effectiveness of treatment  Seated manually resisted clamshell (hips less than 90 degrees flexion ) 10x3 with 5 second holds   Seated hip adduction ball squeeze with glute max squeeze with gentle knee extension isometrics 10x5 seconds for 2 sets   Decreased L knee pain with gait  Sitting on dyna disc  Bilateral shoulder extension isometrics 10x2 with 5 second holds to promote trunk muscle activation  Seated L knee flexion/extension AAROM with small 1 kg ball 10x 2 second holds for 2 sets    Improved exercise technique, movement at target joints, use of target muscles after mod verbal, visual, tactile cues.   Reviewed portable TENS over the counter option with pt to help with pain control.     High Volt E-stim x 15 minutes              chanel 1: medial and lateral L knee 110 V for pain control                        Decreased L knee pain following STM to decrease vastus lateralis and lateral hamstring muscle tension, and exercise to activate hip and vastus medialis muscle. Ended with high volt e-stim for pain control at end of session.             PT Long Term Goals - 03/07/17 1910      PT LONG TERM GOAL #1   Title Patient will have a decrease in L knee pain to 6/10 or less at worst to promote ability to ambulate, perform transfers, functional tasks.    Baseline 10/10 L knee pain at worst (03/07/2017)   Time 6   Period Weeks   Status New   Target Date 04/20/17     PT LONG TERM GOAL #2   Title Patient will have a decrease in R knee pain to 4/10 or less at worst to promote ability to ambulate, perform transfers, functional tasks.    Baseline 8/10 R knee pain at worst (03/07/2017)   Time 6   Period Weeks   Status New   Target Date 04/20/17     PT LONG TERM GOAL #3   Title Patient will improve her LEFS score by at  least 9 points as a demonstration of improved function.    Baseline 25/80 (03/07/2017)   Time 6  Period Weeks   Status New   Target Date 04/20/17     PT LONG TERM GOAL #4   Title Patient will improve L knee extension AROM to -18 degrees or less to promote ability to ambulate and perform standing tasks with less pain.    Baseline -25 degrees seated L knee extension AROM (03/07/2017)   Time 6   Period Weeks   Status New   Target Date 04/20/17     PT LONG TERM GOAL #5   Title Patient will improve bilateral LE strength by at least 1/2 MMT grade to promote ability to perform functional tasks with less knee pain   Time 6   Period Weeks   Status New   Target Date 04/20/17     Additional Long Term Goals   Additional Long Term Goals Yes     PT LONG TERM GOAL #6   Title Patient will be able to ambulate at least 200 ft without AD with overall decreased report of L knee pain   Baseline L knee pain after walking a few feet (03/07/2017)   Time 6   Period Weeks   Status New   Target Date 04/20/17               Plan - 03/28/17 1852    Clinical Impression Statement Decreased L knee pain following STM to decrease vastus lateralis and lateral hamstring muscle tension, and exercise to activate hip and vastus medialis muscle. Ended with high volt e-stim for pain control at end of session.    History and Personal Factors relevant to plan of care: Hx of RA, chronicity of condition. Difficulty wiht transfers, walking, stair negotiation, performing functional tasks in standing.    Clinical Presentation Stable   Clinical Presentation due to: Decreased knee pain after session   Clinical Decision Making Low   Rehab Potential Fair   Clinical Impairments Affecting Rehab Potential Chronicity of condition   PT Frequency 2x / week   PT Duration 6 weeks   PT Treatment/Interventions Aquatic Therapy;Ultrasound;Moist Heat;Iontophoresis 76m/ml Dexamethasone;Electrical Stimulation;Neuromuscular  re-education;Therapeutic exercise;Therapeutic activities;Patient/family education;Manual techniques;Dry needling   PT Next Visit Plan gentle soft tissue work, strengthening, knee extension ROM   Consulted and Agree with Plan of Care Patient      Patient will benefit from skilled therapeutic intervention in order to improve the following deficits and impairments:  Pain, Postural dysfunction, Improper body mechanics, Decreased strength, Difficulty walking, Decreased range of motion  Visit Diagnosis: Chronic pain of left knee  Difficulty in walking, not elsewhere classified  Muscle weakness (generalized)     Problem List Patient Active Problem List   Diagnosis Date Noted  . Cro[place]'s colitis ([place] 09/04/2015    MJoneen BoersPT, DPT   03/28/2017, 7:03 PM  [facility] [address] [place] [address]Phone: [phone]  Fax:  [phone] Name: Nicole HANNAYMRN: [id number] of Birth: 2December 10, 1952

## 2017-03-28 NOTE — Patient Instructions (Signed)
Reviewed and given seated hip adductor ball squeeze with glute max squeeze and isometric knee extension 10x2 with 5 second holds daily as part of her HEP. Pt demonstrated and verbalized understanding.

## 2017-03-30 ENCOUNTER — Ambulatory Visit: Payer: Medicare Other

## 2017-03-30 DIAGNOSIS — G8929 Other chronic pain: Secondary | ICD-10-CM

## 2017-03-30 DIAGNOSIS — M25562 Pain in left knee: Secondary | ICD-10-CM | POA: Diagnosis not present

## 2017-03-30 DIAGNOSIS — R262 Difficulty in walking, not elsewhere classified: Secondary | ICD-10-CM

## 2017-03-30 DIAGNOSIS — M6281 Muscle weakness (generalized): Secondary | ICD-10-CM

## 2017-03-30 NOTE — Therapy (Signed)
[place] [facility] [address] [place], [address] Phone: [phone]   Fax:  [phone]  Physical Therapy Treatment  Patient Details  Name: Nicole Bass MRN: [id number] Date of Birth: [birth year] Referring Provider: [name]. MD  Encounter Date: 03/30/2017      PT End of Session - 03/30/17 1459    Visit Number 4   Number of Visits 13   Date for PT Re-Evaluation 04/20/17   Authorization Type 4   Authorization Time Period of 10 g-code   PT Start Time 1459   PT Stop Time 1557   PT Time Calculation (min) 58 min   Activity Tolerance Patient limited by pain   Behavior During Therapy [facility] for tasks assessed/performed      Past Medical History:  Diagnosis Date  . Anemia   . Arthritis   . Collagen vascular disease ([place])   . Crohn's disease ([place])   . DDD (degenerative disc disease), cervical   . DDD (degenerative disc disease), cervical   . DDD (degenerative disc disease), cervical 2003  . DDD (degenerative disc disease), lumbar   . Degenerative disc disease, cervical   . Difficult intubation     Past Surgical History:  Procedure Laterality Date  . ABDOMINAL HYSTERECTOMY    . APPENDECTOMY    . BACK SURGERY     x 3  . [place]  . CHOLECYSTECTOMY    . COLON SURGERY  1999   removed 18 inches of colon  . DILATION AND CURETTAGE OF UTERUS  2004  . ELBOW FRACTURE SURGERY  2008  . JOINT REPLACEMENT Right 2015   partial knee replacement  . KYPHOPLASTY    . KYPHOPLASTY N/A 04/14/2015   Procedure: KYPHOPLASTY;  Surgeon: Hessie Knows, MD;  Location: [facility]S;  Service: Orthopedics;  Laterality: N/A;  . KYPHOPLASTY N/A 06/04/2015   Procedure: KYPHOPLASTY L-5;  Surgeon: Hessie Knows, MD;  Location: [facility];  Service: Orthopedics;  Laterality: N/A;  . MEDIAL PARTIAL KNEE REPLACEMENT    . OOPHORECTOMY Bilateral   . SHOULDER ARTHROSCOPY WITH ROTATOR CUFF REPAIR AND SUBACROMIAL  DECOMPRESSION Right 04/13/2016   Procedure: SHOULDER ARTHROSCOPY WITH ROTATOR CUFF REPAIR AND SUBACROMIAL DECOMPRESSION, release of long head biceps tendon;  Surgeon: Leanor Kail, MD;  Location: [facility];  Service: Orthopedics;  Laterality: Right;  . TOE SURGERY  2013  . TONSILLECTOMY    . WRIST FRACTURE SURGERY Bilateral     There were no vitals filed for this visit.      Subjective Assessment - 03/30/17 1500    Subjective Pt states that her doctor said that she might have nerve damage and neuropathy in her L LE. Ordered the over the counter TENS machine. Was sore yesterday her rib cage and hips. Better today. Also got a ball for her hip adduction exercise.   No L knee pain currently but her L hip is bothering her.  5/10 L hip pain currently.  The e-stim helped her knee the other day.    Pertinent History L leg and chronic bilateral knee pain L > R. Feels pain at the L knee joint (anterior and posterior) as well as anterior L leg and thigh. Also feels pain in her R knee joint (also has a partial medial knee replacement).  Pt also states having RA. L knee has started bothering her since the first week of July 2018, gradual onset. Pt initally thought that it was an  arthritis flare. Went to her arthritis doctor who sent her to an orthopedic who thinks that her knee is due to arthritis. Has been in the ER and seen multiple doctors for he L knee.   Denies loss of bowel or bladder control or saddle anesthesia. Feels pain in both the front and back or her L thigh and leg.  Has to go down steps sideways.  Had an x-ray for her L knee at the end of June 2018 and her MD thinks her knee pain is from arthritis.  Getting an MRI for her back and her L knee tomorrow.   Pt also adds that her L knee buckles at times.    Patient Stated Goals Walk with no pain.   Currently in Pain? Yes   Pain Score 5   0/10 L knee, 5/10 L hip   Pain Onset More than a month ago            [facility] PT Assessment - 03/30/17 1857       Prior Function   Leisure scrapbooking                             PT Education - 03/30/17 1509    Education provided Yes   Education Details ther-ex   [employer]) Educated Patient   Methods Explanation;Demonstration;Tactile cues;Verbal cues   Comprehension Verbalized understanding;Returned demonstration        Objectives  TTP L lateral hip   There-ex  Seated clamshell isometrics at 40% effort 1 min x 5 with 1 min rest in between  No change in L hip pain  Gait throughout session to check effectiveness of treatment   Seated hip adduction ball squeeze with glute max squeeze 10x5 seconds for 2 sets  Seated L hip extension isometrics 10x2 with 5 second holds   Decreased L hip pain with gait   Improved exercise technique, movement at target joints, use of target muscles after min to mod verbal, visual, tactile cues.       Manual therapy  STM L vastus lateralis in sitting Then to lateral hamstrings             Slight decreased L knee pain      High Volt E-stimx 15 minutes  chanel 1: medial and lateral L knee 155 V for pain control     Good carry over of decreased L knee pain from previous session with pt stating not having L knee pain currently. Pt however states feeling L hip pain which decreased with seated L hip extension isometrics.         PT Long Term Goals - 03/07/17 1910      PT LONG TERM GOAL #1   Title Patient will have a decrease in L knee pain to 6/10 or less at worst to promote ability to ambulate, perform transfers, functional tasks.    Baseline 10/10 L knee pain at worst (03/07/2017)   Time 6   Period Weeks   Status New   Target Date 04/20/17     PT LONG TERM GOAL #2   Title Patient will have a decrease in R knee pain to 4/10 or less at worst to promote ability to ambulate, perform transfers, functional tasks.    Baseline 8/10 R knee pain at worst (03/07/2017)   Time 6   Period Weeks   Status New    Target Date 04/20/17     PT LONG TERM GOAL #  3   Title Patient will improve her LEFS score by at least 9 points as a demonstration of improved function.    Baseline 25/80 (03/07/2017)   Time 6   Period Weeks   Status New   Target Date 04/20/17     PT LONG TERM GOAL #4   Title Patient will improve L knee extension AROM to -18 degrees or less to promote ability to ambulate and perform standing tasks with less pain.    Baseline -25 degrees seated L knee extension AROM (03/07/2017)   Time 6   Period Weeks   Status New   Target Date 04/20/17     PT LONG TERM GOAL #5   Title Patient will improve bilateral LE strength by at least 1/2 MMT grade to promote ability to perform functional tasks with less knee pain   Time 6   Period Weeks   Status New   Target Date 04/20/17     Additional Long Term Goals   Additional Long Term Goals Yes     PT LONG TERM GOAL #6   Title Patient will be able to ambulate at least 200 ft without AD with overall decreased report of L knee pain   Baseline L knee pain after walking a few feet (03/07/2017)   Time 6   Period Weeks   Status New   Target Date 04/20/17               Plan - 03/30/17 1509    Clinical Impression Statement Good carry over of decreased L knee pain from previous session with pt stating not having L knee pain currently. Pt however states feeling L hip pain which decreased with seated L hip extension isometrics.    History and Personal Factors relevant to plan of care: Hx of RA, chronicity of condition. Difficulty with transfers, walking, stair negotiation, performing functional tasks in standing.    Clinical Presentation Stable   Clinical Presentation due to: Good carry over of decreased L knee pain   Clinical Decision Making Low   Rehab Potential Fair   Clinical Impairments Affecting Rehab Potential Chronicity of condition   PT Frequency 2x / week   PT Duration 6 weeks   PT Treatment/Interventions Aquatic  Therapy;Ultrasound;Moist Heat;Iontophoresis 78m/ml Dexamethasone;Electrical Stimulation;Neuromuscular re-education;Therapeutic exercise;Therapeutic activities;Patient/family education;Manual techniques;Dry needling   PT Next Visit Plan gentle soft tissue work, strengthening, knee extension ROM   Consulted and Agree with Plan of Care Patient      Patient will benefit from skilled therapeutic intervention in order to improve the following deficits and impairments:  Pain, Postural dysfunction, Improper body mechanics, Decreased strength, Difficulty walking, Decreased range of motion  Visit Diagnosis: Chronic pain of left knee  Difficulty in walking, not elsewhere classified  Muscle weakness (generalized)     Problem List Patient Active Problem List   Diagnosis Date Noted  . Crohn's colitis ([place] 09/04/2015    MJoneen BoersPT, DPT   03/30/2017, 7:08 PM  [facility] [address] [place] [address]Phone: [phone]  Fax:  [phone] Name: Nicole SCHRUPPMRN: [id number] of Birth: 212/06/1951

## 2017-03-30 NOTE — Patient Instructions (Signed)
Pt was recommended to perform 5 second glute max squeezes throughout the day. Pt verbalized understanding.

## 2017-04-03 ENCOUNTER — Ambulatory Visit: Payer: Medicare Other

## 2017-04-03 DIAGNOSIS — M25562 Pain in left knee: Principal | ICD-10-CM

## 2017-04-03 DIAGNOSIS — R262 Difficulty in walking, not elsewhere classified: Secondary | ICD-10-CM

## 2017-04-03 DIAGNOSIS — G8929 Other chronic pain: Secondary | ICD-10-CM

## 2017-04-03 NOTE — Therapy (Signed)
[place] [facility] [address], [place], [address] Phone: [phone]   Fax:  [phone]  Physical Therapy Treatment  Patient Details  Name: Nicole Bass MRN: [id number] Date of Birth: 1951-08-12 Referring Provider: [name]. MD  Encounter Date: 04/03/2017      PT End of Session - 04/03/17 1546    Visit Number 5   Number of Visits 13   Date for PT Re-Evaluation 04/20/17   Authorization Type 5   Authorization Time Period of 10 g-code   PT Start Time 1546   PT Stop Time 1638   PT Time Calculation (min) 52 min   Activity Tolerance Patient limited by pain   Behavior During Therapy [facility] for tasks assessed/performed      Past Medical History:  Diagnosis Date  . Anemia   . Arthritis   . Collagen vascular disease ([place])   . Crohn's disease ([place])   . DDD (degenerative disc disease), cervical   . DDD (degenerative disc disease), cervical   . DDD (degenerative disc disease), cervical 2003  . DDD (degenerative disc disease), lumbar   . Degenerative disc disease, cervical   . Difficult intubation     Past Surgical History:  Procedure Laterality Date  . ABDOMINAL HYSTERECTOMY    . APPENDECTOMY    . BACK SURGERY     x 3  . [place]  . CHOLECYSTECTOMY    . COLON SURGERY  1999   removed 18 inches of colon  . DILATION AND CURETTAGE OF UTERUS  2004  . ELBOW FRACTURE SURGERY  2008  . JOINT REPLACEMENT Right 2015   partial knee replacement  . KYPHOPLASTY    . KYPHOPLASTY N/A 04/14/2015   Procedure: KYPHOPLASTY;  Surgeon: Hessie Knows, MD;  Location: [facility];  Service: Orthopedics;  Laterality: N/A;  . KYPHOPLASTY N/A 06/04/2015   Procedure: KYPHOPLASTY L-5;  Surgeon: Hessie Knows, MD;  Location: [facility];  Service: Orthopedics;  Laterality: N/A;  . MEDIAL PARTIAL KNEE REPLACEMENT    . OOPHORECTOMY Bilateral   . SHOULDER ARTHROSCOPY WITH ROTATOR CUFF REPAIR AND SUBACROMIAL  DECOMPRESSION Right 04/13/2016   Procedure: SHOULDER ARTHROSCOPY WITH ROTATOR CUFF REPAIR AND SUBACROMIAL DECOMPRESSION, release of long head biceps tendon;  Surgeon: Leanor Kail, MD;  Location: [facility];  Service: Orthopedics;  Laterality: Right;  . TOE SURGERY  2013  . TONSILLECTOMY    . WRIST FRACTURE SURGERY Bilateral     There were no vitals filed for this visit.      Subjective Assessment - 04/03/17 1547    Subjective Pt states feeling pain in her L hip, Feels L hip pain the day after doing the hip adduction ball squeeze and the seated L knee flexion/extension AAROM using ball.  L hip feels like an arthritis flare which also goes to her L low back.   No L hip or back pain currently.  L knee is bothering her today.   4/10 L knee currently. Used essential oil (pepermint) which helped.    Pertinent History L leg and chronic bilateral knee pain L > R. Feels pain at the L knee joint (anterior and posterior) as well as anterior L leg and thigh. Also feels pain in her R knee joint (also has a partial medial knee replacement).  Pt also states having RA. L knee has started bothering her since the first week of July 2018, gradual onset. Pt initally thought that it was  an arthritis flare. Went to her arthritis doctor who sent her to an orthopedic who thinks that her knee is due to arthritis. Has been in the ER and seen multiple doctors for he L knee.   Denies loss of bowel or bladder control or saddle anesthesia. Feels pain in both the front and back or her L thigh and leg.  Has to go down steps sideways.  Had an x-ray for her L knee at the end of June 2018 and her MD thinks her knee pain is from arthritis.  Getting an MRI for her back and her L knee tomorrow.   Pt also adds that her L knee buckles at times.    Patient Stated Goals Walk with no pain.   Currently in Pain? Yes   Pain Score 4    Pain Onset More than a month ago                                 PT Education -  04/03/17 1556    Education provided Yes   Education Details ther-ex   [employer]) Educated Patient   Methods Explanation;Demonstration;Tactile cues;Verbal cues   Comprehension Returned demonstration;Verbalized understanding         Objectives  L medial knee pain which travels anterior to L leg and foot (possible L4 dermatome related)   There-ex  Pt was recommended to hold off on her seated hip adducton ball squeeze and seated L knee flexion/extension AAROM using ball HEP for now. Pt verbalized understanding.    Nustep seat 6, arms 6 for 5 minutes (no charge)  Standing bilateral shoulder extension resisting yellow band 10x3 with scapular retraction   Sitting on dyna disc  pallof press straight resisting double yellow band 10x5 seconds   Then 10x   No L knee pain but R knee discomfort afterwards     No R knee pain when in standing position   No bilateral knee pain with gait afterwards   Low rows resisting yellow band 10x, then 10x5 seconds   Then 10x10 seconds to promote trunk muscle use  SLS with opposite tip toe assist with bilateral UE assist 5x2 for 5 seconds to promote glute med strengthening  Side stepping 5 ft to the L and 5 ft to the R with bilateral UE assist   Sitting on dyna disc, hips, less than 90 degrees flexion  Clamshell isometrics with PT manual resistance 10x5 seconds for 2 sets  Slight low back discomfort which eases with rest   Transversus abdominis contraction 10x5 seconds   Then with pelvic floor activation 10x5 seconds  Improved exercise technique, movement at target joints, use of target muscles after min to mod verbal, visual, tactile cues.   R knee pain decreased with upright back posture, L knee pain decreased with core muscle use. No bilateral knee pain with gait after session. Possible back involvement with bilateral knee pain today. Continue working on gentle trunk and pelvic muscle activation to promote ability to perform standing tasks  with less knee pain.          PT Long Term Goals - 03/07/17 1910      PT LONG TERM GOAL #1   Title Patient will have a decrease in L knee pain to 6/10 or less at worst to promote ability to ambulate, perform transfers, functional tasks.    Baseline 10/10 L knee pain at worst (03/07/2017)   Time 6  Period Weeks   Status New   Target Date 04/20/17     PT LONG TERM GOAL #2   Title Patient will have a decrease in R knee pain to 4/10 or less at worst to promote ability to ambulate, perform transfers, functional tasks.    Baseline 8/10 R knee pain at worst (03/07/2017)   Time 6   Period Weeks   Status New   Target Date 04/20/17     PT LONG TERM GOAL #3   Title Patient will improve her LEFS score by at least 9 points as a demonstration of improved function.    Baseline 25/80 (03/07/2017)   Time 6   Period Weeks   Status New   Target Date 04/20/17     PT LONG TERM GOAL #4   Title Patient will improve L knee extension AROM to -18 degrees or less to promote ability to ambulate and perform standing tasks with less pain.    Baseline -25 degrees seated L knee extension AROM (03/07/2017)   Time 6   Period Weeks   Status New   Target Date 04/20/17     PT LONG TERM GOAL #5   Title Patient will improve bilateral LE strength by at least 1/2 MMT grade to promote ability to perform functional tasks with less knee pain   Time 6   Period Weeks   Status New   Target Date 04/20/17     Additional Long Term Goals   Additional Long Term Goals Yes     PT LONG TERM GOAL #6   Title Patient will be able to ambulate at least 200 ft without AD with overall decreased report of L knee pain   Baseline L knee pain after walking a few feet (03/07/2017)   Time 6   Period Weeks   Status New   Target Date 04/20/17               Plan - 04/03/17 1556    Clinical Impression Statement R knee pain decreased with upright back posture, L knee pain decreased with core muscle use. No bilateral knee  pain with gait after session. Possible back involvement with bilateral knee pain today. Continue working on gentle trunk and pelvic muscle activation to promote ability to perform standing tasks with less knee pain.    History and Personal Factors relevant to plan of care: Hx of RA, chronicity of condition. Difficulty with transfers, walking, stair negotiation, performing functional tasks in standing.    Clinical Presentation Stable   Clinical Presentation due to: decreased bilateral knee pain with core muscle activation   Clinical Decision Making Low   Rehab Potential Fair   Clinical Impairments Affecting Rehab Potential Chronicity of condition   PT Frequency 2x / week   PT Duration 6 weeks   PT Treatment/Interventions Aquatic Therapy;Ultrasound;Moist Heat;Iontophoresis 47m/ml Dexamethasone;Electrical Stimulation;Neuromuscular re-education;Therapeutic exercise;Therapeutic activities;Patient/family education;Manual techniques;Dry needling   PT Next Visit Plan gentle soft tissue work, strengthening, knee extension ROM   Consulted and Agree with Plan of Care Patient      Patient will benefit from skilled therapeutic intervention in order to improve the following deficits and impairments:  Pain, Postural dysfunction, Improper body mechanics, Decreased strength, Difficulty walking, Decreased range of motion  Visit Diagnosis: Chronic pain of left knee  Difficulty in walking, not elsewhere classified     Problem List Patient Active Problem List   Diagnosis Date Noted  . [place]'s [place] 09/04/2015    MJoneen BoersPT, DPT  04/03/2017, 4:46 PM  [place] [facility] [address] [place], [address] Phone: [phone]   Fax:  [phone]  Name: ARNESIA VINCELETTE MRN: [id number] Date of Birth: 01-25-1951

## 2017-04-04 ENCOUNTER — Ambulatory Visit
Admission: RE | Admit: 2017-04-04 | Discharge: 2017-04-04 | Disposition: A | Discharge status: Home / Self Care | Payer: Medicare Other | Source: Ambulatory Visit | Attending: Internal Medicine | Admitting: Internal Medicine

## 2017-04-04 DIAGNOSIS — Z1231 Encounter for screening mammogram for malignant neoplasm of breast: Secondary | ICD-10-CM | POA: Diagnosis present

## 2017-04-05 DIAGNOSIS — G629 Polyneuropathy, unspecified: Secondary | ICD-10-CM | POA: Insufficient documentation

## 2017-04-05 DIAGNOSIS — M79605 Pain in left leg: Secondary | ICD-10-CM | POA: Insufficient documentation

## 2017-04-05 DIAGNOSIS — M5416 Radiculopathy, lumbar region: Secondary | ICD-10-CM | POA: Insufficient documentation

## 2017-04-05 DIAGNOSIS — I83812 Varicose veins of left lower extremities with pain: Secondary | ICD-10-CM | POA: Insufficient documentation

## 2017-04-06 ENCOUNTER — Ambulatory Visit: Payer: Medicare Other

## 2017-04-11 ENCOUNTER — Ambulatory Visit: Payer: Medicare Other

## 2017-04-11 DIAGNOSIS — M6281 Muscle weakness (generalized): Secondary | ICD-10-CM

## 2017-04-11 DIAGNOSIS — M25562 Pain in left knee: Secondary | ICD-10-CM | POA: Diagnosis not present

## 2017-04-11 DIAGNOSIS — R262 Difficulty in walking, not elsewhere classified: Secondary | ICD-10-CM

## 2017-04-11 DIAGNOSIS — G8929 Other chronic pain: Secondary | ICD-10-CM

## 2017-04-11 NOTE — Therapy (Signed)
[place] [facility] [address], [place], [address] Phone: [phone]   Fax:  [phone]  Physical Therapy Treatment  Patient Details  Name: Nicole Bass MRN: [id number] Date of Birth: 1951-01-31 Referring Provider: [name]. MD  Encounter Date: 04/11/2017      PT End of Session - 04/11/17 1630    Visit Number 6   Number of Visits 13   Date for PT Re-Evaluation 04/20/17   Authorization Type 6   Authorization Time Period of 10 g-code   PT Start Time 1634   PT Stop Time 1729   PT Time Calculation (min) 55 min   Activity Tolerance Patient limited by pain   Behavior During Therapy [facility] for tasks assessed/performed      Past Medical History:  Diagnosis Date  . Anemia   . Arthritis   . Collagen vascular disease ([place])   . Crohn's disease ([place])   . DDD (degenerative disc disease), cervical   . DDD (degenerative disc disease), cervical   . DDD (degenerative disc disease), cervical 2003  . DDD (degenerative disc disease), lumbar   . Degenerative disc disease, cervical   . Difficult intubation     Past Surgical History:  Procedure Laterality Date  . ABDOMINAL HYSTERECTOMY    . APPENDECTOMY    . BACK SURGERY     x 3  . [place]  . CHOLECYSTECTOMY    . COLON SURGERY  1999   removed 18 inches of colon  . DILATION AND CURETTAGE OF UTERUS  2004  . ELBOW FRACTURE SURGERY  2008  . JOINT REPLACEMENT Right 2015   partial knee replacement  . KYPHOPLASTY    . KYPHOPLASTY N/A 04/14/2015   Procedure: KYPHOPLASTY;  Surgeon: Hessie Knows, MD;  Location: [facility] ORS;  Service: Orthopedics;  Laterality: N/A;  . KYPHOPLASTY N/A 06/04/2015   Procedure: KYPHOPLASTY L-5;  Surgeon: Hessie Knows, MD;  Location: [facility] ORS;  Service: Orthopedics;  Laterality: N/A;  . MEDIAL PARTIAL KNEE REPLACEMENT    . OOPHORECTOMY Bilateral   . SHOULDER ARTHROSCOPY WITH ROTATOR CUFF REPAIR AND SUBACROMIAL  DECOMPRESSION Right 04/13/2016   Procedure: SHOULDER ARTHROSCOPY WITH ROTATOR CUFF REPAIR AND SUBACROMIAL DECOMPRESSION, release of long head biceps tendon;  Surgeon: Leanor Kail, MD;  Location: [facility] ORS;  Service: Orthopedics;  Laterality: Right;  . TOE SURGERY  2013  . TONSILLECTOMY    . WRIST FRACTURE SURGERY Bilateral     There were no vitals filed for this visit.      Subjective Assessment - 04/11/17 1634    Subjective Pt states that her diagnosis from her MD has changed. Pt going have partial L knee replacement (medial compartment) due to bone on bone.  Was told by MD (Dr. Jefm Bryant) to finish up PT before her surgery.  7/10 L knee pain currently.  Pt was also told that she might have a Baker's cyst L knee.    Pertinent History L leg and chronic bilateral knee pain L > R. Feels pain at the L knee joint (anterior and posterior) as well as anterior L leg and thigh. Also feels pain in her R knee joint (also has a partial medial knee replacement).  Pt also states having RA. L knee has started bothering her since the first week of July 2018, gradual onset. Pt initally thought that it was an arthritis flare. Went to her arthritis doctor who sent her to an orthopedic who thinks  that her knee is due to arthritis. Has been in the ER and seen multiple doctors for he L knee.   Denies loss of bowel or bladder control or saddle anesthesia. Feels pain in both the front and back or her L thigh and leg.  Has to go down steps sideways.  Had an x-ray for her L knee at the end of June 2018 and her MD thinks her knee pain is from arthritis.  Getting an MRI for her back and her L knee tomorrow.   Pt also adds that her L knee buckles at times.    Patient Stated Goals Walk with no pain.   Currently in Pain? Yes   Pain Score 7    Pain Onset More than a month ago                                 PT Education - 04/11/17 1738    Education provided Yes   Education Details proper use of  TENS unit, contraindication, ther-ex, HEP   Person(s) Educated Patient   Methods Explanation;Demonstration;Tactile cues;Verbal cues   Comprehension Returned demonstration;Verbalized understanding          Objectives - 23 degrees seated L knee extension AROM   E-stim 25 minutes Instructed patient on how to use pt portable TENS unit as well as contraindications. Pt demonstrated and verbalized understanding.     There-ex   Sitting on dyna disc   Low rows resisting yellow band 10x5 seconds                         Then 5x10 seconds to promote trunk muscle use   Seated bilateral shoulder extension isometrics, hands against thighs 10x5 seconds to promote trunk muscle use.,             SLS on L LE with opposite tip toe assist with bilateral UE assist 5x for 5 seconds to promote glute med strengthening  Side stepping 5 ft to the L and 5 ft to the R with bilateral UE assist 7x  Sitting on dyna disc on chair  Seated manually resisted clam shell isometrics 10x2 with 5 second holds     Improved exercise technique, movement at target joints, use of target muscles after min to mod verbal, visual, tactile cues.    Manual therapy    Seated STM to L medial and lateral hamstrings to promote knee extension ROM    -21 degrees seated L knee extension AROM afterwards   Able to ambulate with less L knee pain afterwards    Continued working on trunk and hip muscle strengthening to help promote trunk and femoral control with standing activities. Decreased L knee pain with gait with slight improvement in knee extension seated AROM after performing STM to L hamstrings. Reviewed proper use of pt personal TENS unit for her knee as well as contraindications. Pt demonstrated and verbalized understanding. Pt to continue with PT until the end of plan of care to continue with improving strength, function, decreasing pain followed by her partial knee replacement surgery.          PT Long  Term Goals - 03/07/17 1910      PT LONG TERM GOAL #1   Title Patient will have a decrease in L knee pain to 6/10 or less at worst to promote ability to ambulate, perform transfers, functional tasks.    Baseline  10/10 L knee pain at worst (03/07/2017)   Time 6   Period Weeks   Status New   Target Date 04/20/17     PT LONG TERM GOAL #2   Title Patient will have a decrease in R knee pain to 4/10 or less at worst to promote ability to ambulate, perform transfers, functional tasks.    Baseline 8/10 R knee pain at worst (03/07/2017)   Time 6   Period Weeks   Status New   Target Date 04/20/17     PT LONG TERM GOAL #3   Title Patient will improve her LEFS score by at least 9 points as a demonstration of improved function.    Baseline 25/80 (03/07/2017)   Time 6   Period Weeks   Status New   Target Date 04/20/17     PT LONG TERM GOAL #4   Title Patient will improve L knee extension AROM to -18 degrees or less to promote ability to ambulate and perform standing tasks with less pain.    Baseline -25 degrees seated L knee extension AROM (03/07/2017)   Time 6   Period Weeks   Status New   Target Date 04/20/17     PT LONG TERM GOAL #5   Title Patient will improve bilateral LE strength by at least 1/2 MMT grade to promote ability to perform functional tasks with less knee pain   Time 6   Period Weeks   Status New   Target Date 04/20/17     Additional Long Term Goals   Additional Long Term Goals Yes     PT LONG TERM GOAL #6   Title Patient will be able to ambulate at least 200 ft without AD with overall decreased report of L knee pain   Baseline L knee pain after walking a few feet (03/07/2017)   Time 6   Period Weeks   Status New   Target Date 04/20/17               Plan - 04/11/17 1629    Clinical Impression Statement Continued working on trunk and hip muscle strengthening to help promote trunk and femoral control with standing activities. Decreased L knee pain with gait  with slight improvement in knee extension seated AROM after performing STM to L hamstrings. Reviewed proper use of pt personal TENS unit for her knee as well as contraindications. Pt demonstrated and verbalized understanding. Pt to continue with PT until the end of plan of care to continue with improving strength, function, decreasing pain followed by her partial knee replacement surgery.    History and Personal Factors relevant to plan of care: Hx of RA, chronicity of condition. Difficulty with transfers, walking, stair negotiation, performing functional tasks in standing.    Clinical Presentation Stable   Clinical Presentation due to: Decreased L knee pain after session   Clinical Decision Making Low   Rehab Potential Fair   Clinical Impairments Affecting Rehab Potential Chronicity of condition   PT Frequency 2x / week   PT Duration 6 weeks   PT Treatment/Interventions Aquatic Therapy;Ultrasound;Moist Heat;Iontophoresis 57m/ml Dexamethasone;Electrical Stimulation;Neuromuscular re-education;Therapeutic exercise;Therapeutic activities;Patient/family education;Manual techniques;Dry needling   PT Next Visit Plan gentle soft tissue work, strengthening, knee extension ROM   Consulted and Agree with Plan of Care Patient      Patient will benefit from skilled therapeutic intervention in order to improve the following deficits and impairments:  Pain, Postural dysfunction, Improper body mechanics, Decreased strength, Difficulty walking, Decreased range of motion  Visit Diagnosis: Chronic pain of left knee  Difficulty in walking, not elsewhere classified  Muscle weakness (generalized)     Problem List Patient Active Problem List   Diagnosis Date Noted  . Crohn's colitis ([place]) 09/04/2015     Joneen Boers PT, DPT  04/11/2017, 5:59 PM  [place] [facility] [address] [place], [address] Phone: [phone]   Fax:   [phone]  Name: Nicole Bass MRN: [id number] Date of Birth: [birth year]

## 2017-04-11 NOTE — Patient Instructions (Signed)
  Pt was recommended to perform seated L hamstring stretch at home (L leg comfortably propped onto stool) with moist heat at her hamstrings 15 minutes at a time to promote knee extension ROM and hopefully decrease L knee pain. Pt demonstrated and verbalized understanding.

## 2017-04-13 ENCOUNTER — Ambulatory Visit: Payer: Medicare Other

## 2017-04-13 DIAGNOSIS — R262 Difficulty in walking, not elsewhere classified: Secondary | ICD-10-CM

## 2017-04-13 DIAGNOSIS — M25562 Pain in left knee: Secondary | ICD-10-CM | POA: Diagnosis not present

## 2017-04-13 DIAGNOSIS — G8929 Other chronic pain: Secondary | ICD-10-CM

## 2017-04-13 NOTE — Therapy (Signed)
[place] [facility] [address], [place], [address] Phone: [phone]   Fax:  [phone]  Physical Therapy Treatment  Patient Details  Name: Nicole Bass MRN: [id number] Date of Birth: [birth year] Referring Provider: [name]. MD  Encounter Date: 04/13/2017      PT End of Session - 04/13/17 1621    Visit Number 6   Number of Visits 13   Date for PT Re-Evaluation 04/20/17   Authorization Type 7   Authorization Time Period of 10 g-code   PT Start Time 1621   PT Stop Time 1708   PT Time Calculation (min) 47 min   Activity Tolerance Patient limited by pain   Behavior During Therapy [facility] for tasks assessed/performed      Past Medical History:  Diagnosis Date  . Anemia   . Arthritis   . Collagen vascular disease ([place])   . Crohn's disease ([place])   . DDD (degenerative disc disease), cervical   . DDD (degenerative disc disease), cervical   . DDD (degenerative disc disease), cervical 2003  . DDD (degenerative disc disease), lumbar   . Degenerative disc disease, cervical   . Difficult intubation     Past Surgical History:  Procedure Laterality Date  . ABDOMINAL HYSTERECTOMY    . APPENDECTOMY    . BACK SURGERY     x 3  . [place]  . CHOLECYSTECTOMY    . COLON SURGERY  1999   removed 18 inches of colon  . DILATION AND CURETTAGE OF UTERUS  2004  . ELBOW FRACTURE SURGERY  2008  . JOINT REPLACEMENT Right 2015   partial knee replacement  . KYPHOPLASTY    . KYPHOPLASTY N/A 04/14/2015   Procedure: KYPHOPLASTY;  Surgeon: Hessie Knows, MD;  Location: [facility] OR[facility];  Service: Orthopedics;  Laterality: N/A;  . KYPHOPLASTY N/A 06/04/2015   Procedure: KYPHOPLASTY L-5;  Surgeon: Hessie Knows, MD;  Location: [facility] OR[facility];  Service: Orthopedics;  Laterality: N/A;  . MEDIAL PARTIAL KNEE REPLACEMENT    . OOPHORECTOMY Bilateral   . SHOULDER ARTHROSCOPY WITH ROTATOR CUFF REPAIR AND SUBACROMIAL  DECOMPRESSION Right 04/13/2016   Procedure: SHOULDER ARTHROSCOPY WITH ROTATOR CUFF REPAIR AND SUBACROMIAL DECOMPRESSION, release of long head biceps tendon;  Surgeon: Leanor Kail, MD;  Location: [facility] OR[facility];  Service: Orthopedics;  Laterality: Right;  . TOE SURGERY  2013  . TONSILLECTOMY    . WRIST FRACTURE SURGERY Bilateral     There were no vitals filed for this visit.      Subjective Assessment - 04/13/17 1622    Subjective Pt states that the TENS unit at home helps. Has been walking on her L knee, has been busy with errands. Took Tylenol since 12 pm. Has not taken gabapentin.  7/10 L knee currently at worst.     Pertinent History L leg and chronic bilateral knee pain L > R. Feels pain at the L knee joint (anterior and posterior) as well as anterior L leg and thigh. Also feels pain in her R knee joint (also has a partial medial knee replacement).  Pt also states having RA. L knee has started bothering her since the first week of July 2018, gradual onset. Pt initally thought that it was an arthritis flare. Went to her arthritis doctor who sent her to an orthopedic who thinks that her knee is due to arthritis. Has been in the ER and seen multiple doctors for he L knee.  Denies loss of bowel or bladder control or saddle anesthesia. Feels pain in both the front and back or her L thigh and leg.  Has to go down steps sideways.  Had an x-ray for her L knee at the end of June 2018 and her MD thinks her knee pain is from arthritis.  Getting an MRI for her back and her L knee tomorrow.   Pt also adds that her L knee buckles at times.    Patient Stated Goals Walk with no pain.   Currently in Pain? Yes   Pain Score 7    Pain Onset More than a month ago            [facility] PT Assessment - 04/13/17 1725      Observation/Other Assessments   Lower Extremity Functional Scale  26/80                             PT Education - 04/13/17 1654    Education provided Yes   Education  Details ther-ex   [employer]) Educated Patient   Methods Explanation;Demonstration;Tactile cues;Verbal cues   Comprehension Returned demonstration;Verbalized understanding      Objectives  seated L knee extension AROM at start of session -21 degrees   Manual therapy       Seated STM to L medial and lateral hamstrings to promote knee extension ROM   Seated L knee extension AROM -19 degrees afterwards     There-ex  Standing glute max squeeze with bilateral UE assist 5x5 seconds for 2 sets  Standing gentle knee flexion/extension 5x2  Sitting on dyna disc  Low rows resisting yellow band 5x10 seconds for 2 sets to promote trunk muscle use  Bilateral ankle DF/PF to promote soft tissue mobility 10x2 each way  Seated manually resisted L knee extension in a pain free range 10x. Discomfort which eases with rest  Seated L hip extension isometrics, manual resistance to L posterior thigh to avoid ground reaction force to L knee joint 2x5 with 5 second holds.    Improved exercise technique, movement at target joints, use of target muscles after min to mod verbal, visual, tactile cues.    Pt tolerated session well without aggravation of symptoms. Improved L knee extension AROM with decreased pain with walking following manual therapy to decrease L hamstring muscle tension. Continued working on gentle LE strengthening as tolerated to help prepare for her knee surgery. Pain in her L knee seems to increase with knee extension related movements. Pain eases with rest. No change with LEFS score since initial evaluation. Pain level at worst however seems to have decreased to 7/10 for the past 7 days.          PT Long Term Goals - 03/07/17 1910      PT LONG TERM GOAL #1   Title Patient will have a decrease in L knee pain to 6/10 or less at worst to promote ability to ambulate, perform transfers, functional tasks.    Baseline 10/10 L knee pain at worst (03/07/2017)   Time 6   Period Weeks    Status New   Target Date 04/20/17     PT LONG TERM GOAL #2   Title Patient will have a decrease in R knee pain to 4/10 or less at worst to promote ability to ambulate, perform transfers, functional tasks.    Baseline 8/10 R knee pain at worst (03/07/2017)   Time 6  Period Weeks   Status New   Target Date 04/20/17     PT LONG TERM GOAL #3   Title Patient will improve her LEFS score by at least 9 points as a demonstration of improved function.    Baseline 25/80 (03/07/2017)   Time 6   Period Weeks   Status New   Target Date 04/20/17     PT LONG TERM GOAL #4   Title Patient will improve L knee extension AROM to -18 degrees or less to promote ability to ambulate and perform standing tasks with less pain.    Baseline -25 degrees seated L knee extension AROM (03/07/2017)   Time 6   Period Weeks   Status New   Target Date 04/20/17     PT LONG TERM GOAL #5   Title Patient will improve bilateral LE strength by at least 1/2 MMT grade to promote ability to perform functional tasks with less knee pain   Time 6   Period Weeks   Status New   Target Date 04/20/17     Additional Long Term Goals   Additional Long Term Goals Yes     PT LONG TERM GOAL #6   Title Patient will be able to ambulate at least 200 ft without AD with overall decreased report of L knee pain   Baseline L knee pain after walking a few feet (03/07/2017)   Time 6   Period Weeks   Status New   Target Date 04/20/17               Plan - 04/13/17 1619    Clinical Impression Statement Pt tolerated session well without aggravation of symptoms. Improved L knee extension AROM with decreased pain with walking following manual therapy to decrease L hamstring muscle tension. Continued working on gentle LE strengthening as tolerated to help prepare for her knee surgery. Pain in her L knee seems to increase with knee extension related movements. Pain eases with rest. No change with LEFS score since initial evaluation. Pain  level at worst however seems to have decreased to 7/10 for the past 7 days.    History and Personal Factors relevant to plan of care: Hx of RA, chronicity of condition. Difficulty with transfers, walking, stair negotiation, performing functional tasks in standing.    Clinical Presentation Stable   Clinical Presentation due to: Pt tolerated session well without aggravation of symptoms. 7/10 L knee pain at worst for the past 7 days.   Clinical Decision Making Low   Rehab Potential Fair   Clinical Impairments Affecting Rehab Potential Chronicity of condition   PT Frequency 2x / week   PT Duration 6 weeks   PT Treatment/Interventions Aquatic Therapy;Ultrasound;Moist Heat;Iontophoresis 62m/ml Dexamethasone;Electrical Stimulation;Neuromuscular re-education;Therapeutic exercise;Therapeutic activities;Patient/family education;Manual techniques;Dry needling   PT Next Visit Plan gentle soft tissue work, strengthening, knee extension ROM   Consulted and Agree with Plan of Care Patient      Patient will benefit from skilled therapeutic intervention in order to improve the following deficits and impairments:  Pain, Postural dysfunction, Improper body mechanics, Decreased strength, Difficulty walking, Decreased range of motion  Visit Diagnosis: Chronic pain of left knee  Difficulty in walking, not elsewhere classified     Problem List Patient Active Problem List   Diagnosis Date Noted  . Crohn's colitis ([place] 09/04/2015    MJoneen BoersPT, DPT   04/13/2017, 5:34 PM  [facility] [address] [place] [id number]  Phone: [phone]   Fax:  [phone]  Name: Nicole Bass MRN: [id number] Date of Birth: 05/17/1951

## 2017-04-18 ENCOUNTER — Other Ambulatory Visit: Payer: Self-pay | Admitting: Unknown Physician Specialty

## 2017-04-18 DIAGNOSIS — K50819 Crohn's disease of both small and large intestine with unspecified complications: Secondary | ICD-10-CM

## 2017-04-20 ENCOUNTER — Ambulatory Visit: Payer: Medicare Other | Attending: Rheumatology

## 2017-04-20 DIAGNOSIS — G8929 Other chronic pain: Secondary | ICD-10-CM | POA: Insufficient documentation

## 2017-04-20 DIAGNOSIS — M25561 Pain in right knee: Secondary | ICD-10-CM | POA: Diagnosis present

## 2017-04-20 DIAGNOSIS — M25562 Pain in left knee: Secondary | ICD-10-CM | POA: Diagnosis present

## 2017-04-20 DIAGNOSIS — M6281 Muscle weakness (generalized): Secondary | ICD-10-CM | POA: Diagnosis present

## 2017-04-20 DIAGNOSIS — R262 Difficulty in walking, not elsewhere classified: Secondary | ICD-10-CM

## 2017-04-20 NOTE — Therapy (Signed)
[facility] [address] [place], [address] Phone: [phone]   Fax:  [phone]  Physical Therapy Treatment And Progress Report (G-Code)    Patient Details  Name: Nicole Bass MRN: [id number] Date of Birth: Jan 21, 1951 Referring Provider: [name]. MD  Encounter Date: 04/20/2017      PT End of Session - 04/20/17 1604    Visit Number 8   Number of Visits 13   Date for PT Re-Evaluation 04/20/17   Authorization Type 1   Authorization Time Period of 10 g-code   PT Start Time 1604   PT Stop Time 0100   PT Time Calculation (min) 49 min   Activity Tolerance Patient limited by pain   Behavior During Therapy [facility] for tasks assessed/performed      Past Medical History:  Diagnosis Date  . Anemia   . Arthritis   . Collagen vascular disease ([place])   . Crohn's disease ([place])   . DDD (degenerative disc disease), cervical   . DDD (degenerative disc disease), cervical   . DDD (degenerative disc disease), cervical 2003  . DDD (degenerative disc disease), lumbar   . Degenerative disc disease, cervical   . Difficult intubation     Past Surgical History:  Procedure Laterality Date  . ABDOMINAL HYSTERECTOMY    . APPENDECTOMY    . BACK SURGERY     x 3  . [place]  . CHOLECYSTECTOMY    . COLON SURGERY  1999   removed 18 inches of colon  . DILATION AND CURETTAGE OF UTERUS  2004  . ELBOW FRACTURE SURGERY  2008  . JOINT REPLACEMENT Right 2015   partial knee replacement  . KYPHOPLASTY    . KYPHOPLASTY N/A 04/14/2015   Procedure: KYPHOPLASTY;  Surgeon: Hessie Knows, MD;  Location: [facility] ORS;  Service: Orthopedics;  Laterality: N/A;  . KYPHOPLASTY N/A 06/04/2015   Procedure: KYPHOPLASTY L-5;  Surgeon: Hessie Knows, MD;  Location: [facility] ORS;  Service: Orthopedics;  Laterality: N/A;  . MEDIAL PARTIAL KNEE REPLACEMENT    . OOPHORECTOMY Bilateral   . SHOULDER ARTHROSCOPY WITH  ROTATOR CUFF REPAIR AND SUBACROMIAL DECOMPRESSION Right 04/13/2016   Procedure: SHOULDER ARTHROSCOPY WITH ROTATOR CUFF REPAIR AND SUBACROMIAL DECOMPRESSION, release of long head biceps tendon;  Surgeon: Leanor Kail, MD;  Location: [facility] ORS;  Service: Orthopedics;  Laterality: Right;  . TOE SURGERY  2013  . TONSILLECTOMY    . WRIST FRACTURE SURGERY Bilateral     There were no vitals filed for this visit.      Subjective Assessment - 04/20/17 1605    Subjective L knee is actually better than the R knee. Used the TENS machine for R knee for 15 min which helped ease off the pain.  Also took Tylenol.  0/10 L knee currently (6/10 L knee at worst for the past 7 days), Getting L partial knee replacement in October 2018.  0/10 R knee currently.  Feels like PT helped.  Used to take 6 gabapentin per day and now down to 2 a day. Still taking tylenol. Pt states seeing her MD on Monday 04/24/2017 and wants to see if she still needs PT.  Pt also taking 10 mg of prednisone since starting PT about 6 weeks ago.    Pertinent History L leg and chronic bilateral knee pain L > R. Feels pain at the L knee joint (anterior and posterior) as well as anterior L leg  and thigh. Also feels pain in her R knee joint (also has a partial medial knee replacement).  Pt also states having RA. L knee has started bothering her since the first week of July 2018, gradual onset. Pt initally thought that it was an arthritis flare. Went to her arthritis doctor who sent her to an orthopedic who thinks that her knee is due to arthritis. Has been in the ER and seen multiple doctors for he L knee.   Denies loss of bowel or bladder control or saddle anesthesia. Feels pain in both the front and back or her L thigh and leg.  Has to go down steps sideways.  Had an x-ray for her L knee at the end of June 2018 and her MD thinks her knee pain is from arthritis.  Getting an MRI for her back and her L knee tomorrow.   Pt also adds that her L knee buckles at  times.    Patient Stated Goals Walk with no pain.   Currently in Pain? No/denies   Pain Score 0-No pain   Pain Onset More than a month ago            [facility] PT Assessment - 04/20/17 1626      Observation/Other Assessments   Lower Extremity Functional Scale  33/80     AROM   Right Knee Extension -15   Left Knee Extension -19     Strength   Right Hip Flexion 4+/5   Right Hip ABduction 5/5  seated clamshell isometrics, hips less than 90 degree flex   Left Hip Flexion 5/5   Left Hip ABduction 5/5  seated clamshell isometrics, hips less than 90 degree flex   Right Knee Flexion 4+/5   Right Knee Extension 5/5   Left Knee Flexion 4+/5  no pain   Left Knee Extension 5/5                             PT Education - 04/20/17 1614    Education provided Yes   Education Details ther-ex, HEP   Person(s) Educated Patient   Methods Explanation;Demonstration;Tactile cues;Verbal cues   Comprehension Returned demonstration;Verbalized understanding        Objectives     Manual therapy   Seated STM to L medial and lateral hamstrings to promote knee extension ROM              Seated L knee extension AROM -19 degrees afterwards     There-ex  Reviewed ankle pumps to perform after L partial knee replacement surgery as well as importance of walking and moving her leg. Pt verbalized understanding.   Standing glute max squeeze with bilateral UE assist 10x5 seconds then 5x5 seconds   Sitting on chair  Bilateral shoulder extension isometrics, hands on thighs 10x5 second holds  Bilateral ankle DF/PF to promote soft tissue mobility 10x2 each way  Gait around gym 200 ft no AD, no pain in both knees  Reviewed progress/current status with PT towards goals.   Seated manually resisted hip flexion, clamshell isometrics (hips less than 90 degrees flexion), seated knee flexion, seated knee extension 1-2x each way for each LE.   Reviewed progress/current  status with LE strength with pt.    Improved exercise technique, movement at target joints, use of target muscles after min to mod verbal, visual, tactile cues.    Pt demonstrates overall improved L knee pain, improved L knee extension AROM, LE  strength, function, and ability to ambulate (at least 200 ft today) with decreased L knee pain since initial evaluation. Patient has made progress with PT towards goals. Pt able to walk 200 ft today without bilateral knee pain and no LOB. Pt still demonstrates bilateral LE weakness, knee pain, decreased knee extension ROM, and difficulty performing functional tasks such as walking and would benefit from continued skilled physical therapy services to address the aforementioned deficits.          PT Long Term Goals - 04/20/17 1633      PT LONG TERM GOAL #1   Title Patient will have a decrease in L knee pain to 6/10 or less at worst to promote ability to ambulate, perform transfers, functional tasks.    Baseline 10/10 L knee pain at worst (03/07/2017); 6/10 L knee pain at worst for the past 7 days (04/20/2017)   Time 6   Period Weeks   Status Achieved   Target Date 04/20/17     PT LONG TERM GOAL #2   Title Patient will have a decrease in R knee pain to 4/10 or less at worst to promote ability to ambulate, perform transfers, functional tasks.    Baseline 8/10 R knee pain at worst (03/07/2017); 8/10 R knee pain earlier today but 0/10 currently, Other than earlier today, R knee pain was 0/10 at worst for the past 7 days (04/20/2017)    Time 6   Period Weeks   Status Partially Met   Target Date 04/20/17     PT LONG TERM GOAL #3   Title Patient will improve her LEFS score by at least 9 points as a demonstration of improved function.    Baseline 25/80 (03/07/2017); 33/80 (04/20/2017)   Time 6   Period Weeks   Status Partially Met   Target Date 04/20/17     PT LONG TERM GOAL #4   Title Patient will improve L knee extension AROM to -18 degrees or less to  promote ability to ambulate and perform standing tasks with less pain.    Baseline -25 degrees seated L knee extension AROM (03/07/2017); -19 degrees seated L knee extension AROM (04/20/2017)    Time 6   Period Weeks   Status Partially Met     PT LONG TERM GOAL #5   Title Patient will improve bilateral LE strength by at least 1/2 MMT grade to promote ability to perform functional tasks with less knee pain   Time 6   Period Weeks   Status Partially Met   Target Date 04/20/17     PT LONG TERM GOAL #6   Title Patient will be able to ambulate at least 200 ft without AD with overall decreased report of L knee pain   Baseline L knee pain after walking a few feet (03/07/2017); able to ambulate 200 ft today without AD and no bilateral knee pain. (04/20/2017)   Time 6   Period Weeks   Status Achieved   Target Date 04/20/17               Plan - 04/20/17 1629    Clinical Impression Statement Pt demonstrates overall improved L knee pain, improved L knee extension AROM, LE strength, function, and ability to ambulate (at least 200 ft today) with decreased L knee pain since initial evaluation. Patient has made progress with PT towards goals. Pt able to walk 200 ft today without bilateral knee pain and no LOB. Pt still demonstrates bilateral LE weakness, knee pain,  decreased knee extension ROM, and difficulty performing functional tasks such as walking and would benefit from continued skilled physical therapy services to address the aforementioned deficits.     History and Personal Factors relevant to plan of care: Hx of RA, chronicity of condition. Difficulty with transfers, walking, stair negotiation, performing functional tasks in standing.    Clinical Presentation Stable   Clinical Presentation due to: Improved L knee pain since start of PT   Clinical Decision Making Low   Rehab Potential Fair   Clinical Impairments Affecting Rehab Potential Chronicity of condition   PT Frequency 2x / week   PT  Duration 6 weeks   PT Treatment/Interventions Aquatic Therapy;Ultrasound;Moist Heat;Iontophoresis 33m/ml Dexamethasone;Electrical Stimulation;Neuromuscular re-education;Therapeutic exercise;Therapeutic activities;Patient/family education;Manual techniques;Dry needling   PT Next Visit Plan gentle soft tissue work, strengthening, knee extension ROM   Consulted and Agree with Plan of Care Patient      Patient will benefit from skilled therapeutic intervention in order to improve the following deficits and impairments:  Pain, Postural dysfunction, Improper body mechanics, Decreased strength, Difficulty walking, Decreased range of motion  Visit Diagnosis: Chronic pain of left knee  Difficulty in walking, not elsewhere classified  Muscle weakness (generalized)  Chronic pain of right knee       G-Codes - [id number]   Functional Assessment Tool Used (Outpatient Only) LEFS, clinical presentation, patient interview   Functional Limitation Mobility: Walking and moving around   Mobility: Walking and Moving Around Current Status [phone] At least 60 percent but less than 80 percent impaired, limited or restricted   Mobility: Walking and Moving Around Goal Status [phone] At least 20 percent but less than 40 percent impaired, limited or restricted      Problem List Patient Active Problem List   Diagnosis Date Noted  . Crohn's colitis ([place] 09/04/2015     Thank you for your referral.  MJoneen BoersPT, DPT   910/03/18 8:48 PM  [facility] [address] [place] [address]Phone: [phone]  Fax:  [phone] Name: DCHENA CHOHANMRN: [id number] of Birth: [birth year]

## 2017-04-20 NOTE — Patient Instructions (Signed)
  Sitting on a chair,    Press your hands on your thighs to feel your abdominal muscles work.    Hold for 5 seconds.    Repeat 10 times   Perform 3 sets daily.

## 2017-05-03 ENCOUNTER — Encounter: Payer: Self-pay | Admitting: Emergency Medicine

## 2017-05-03 ENCOUNTER — Ambulatory Visit
Admission: EM | Admit: 2017-05-03 | Discharge: 2017-05-03 | Disposition: A | Discharge status: Home / Self Care | Payer: Medicare Other | Attending: Family Medicine | Admitting: Family Medicine

## 2017-05-03 DIAGNOSIS — J029 Acute pharyngitis, unspecified: Secondary | ICD-10-CM | POA: Diagnosis not present

## 2017-05-03 DIAGNOSIS — H9202 Otalgia, left ear: Secondary | ICD-10-CM | POA: Diagnosis not present

## 2017-05-03 DIAGNOSIS — J069 Acute upper respiratory infection, unspecified: Secondary | ICD-10-CM

## 2017-05-03 DIAGNOSIS — R05 Cough: Secondary | ICD-10-CM | POA: Diagnosis not present

## 2017-05-03 LAB — RAPID STREP SCREEN (MED CTR MEBANE ONLY): Streptococcus, Group A Screen (Direct): NEGATIVE

## 2017-05-03 MED ORDER — LIDOCAINE VISCOUS 2 % MT SOLN: 15.0000 mL | Freq: Three times a day (TID) | OROMUCOSAL | 0 refills | Status: DC | PRN

## 2017-05-03 NOTE — Discharge Instructions (Signed)
Take medication as prescribed. Rest. Drink plenty of fluids. Continue home medications.  Follow up with your primary care physician this week as needed. Return to Urgent care for new or worsening concerns.

## 2017-05-03 NOTE — ED Provider Notes (Signed)
[facility] ____________________________________________  Time seen: Approximately 7:29 PM  I have reviewed the triage vital signs and the nursing notes.   HISTORY  Chief Complaint Cough and Sore Throat   HPI Nicole Bass is a 66 y.o. female presenting with husband at bedside for evaluation of 4-5 days of runny nose, nasal congestion and cough, but reports in the last 2 days she has had sore throat and left ear discomfort. States her primary doctor called in doxycycline and Tessalon Perles for her which has somewhat helped. Denies fevers. States has been coughing up some whitish to grayish mucus. Reports not eating and drinking as much as normal due to sore throat. Reports grandson recently sick with some congestion, denies other known sick contacts. Has not been taking any other medications for the same complaints. Denies aggravating or alleviating factors. Denies chest pain, shortness of breath, abdominal pain, extremity pain, extremity swelling or rash. Denies recent sickness. Denies recent antibiotic use.   Nicole Crouch, MD: PCP   Past Medical History:  Diagnosis Date  . Anemia   . Arthritis   . Collagen vascular disease ([place])   . Crohn's disease ([place])   . DDD (degenerative disc disease), cervical   . DDD (degenerative disc disease), cervical   . DDD (degenerative disc disease), cervical 2003  . DDD (degenerative disc disease), lumbar   . Degenerative disc disease, cervical   . Difficult intubation     Patient Active Problem List   Diagnosis Date Noted  . Crohn's colitis ([place]) 09/04/2015    Past Surgical History:  Procedure Laterality Date  . ABDOMINAL HYSTERECTOMY    . APPENDECTOMY    . BACK SURGERY     x 3  . [place]  . CHOLECYSTECTOMY    . COLON SURGERY  1999   removed 18 inches of colon  . DILATION AND CURETTAGE OF UTERUS  2004  . ELBOW FRACTURE SURGERY  2008  . JOINT REPLACEMENT Right 2015   partial knee  replacement  . KYPHOPLASTY    . KYPHOPLASTY N/A 04/14/2015   Procedure: KYPHOPLASTY;  Surgeon: Hessie Knows, MD;  Location: [facility] ORS;  Service: Orthopedics;  Laterality: N/A;  . KYPHOPLASTY N/A 06/04/2015   Procedure: KYPHOPLASTY L-5;  Surgeon: Hessie Knows, MD;  Location: [facility] ORS;  Service: Orthopedics;  Laterality: N/A;  . MEDIAL PARTIAL KNEE REPLACEMENT    . OOPHORECTOMY Bilateral   . SHOULDER ARTHROSCOPY WITH ROTATOR CUFF REPAIR AND SUBACROMIAL DECOMPRESSION Right 04/13/2016   Procedure: SHOULDER ARTHROSCOPY WITH ROTATOR CUFF REPAIR AND SUBACROMIAL DECOMPRESSION, release of long head biceps tendon;  Surgeon: Leanor Kail, MD;  Location: [facility] ORS;  Service: Orthopedics;  Laterality: Right;  . TOE SURGERY  2013  . TONSILLECTOMY    . WRIST FRACTURE SURGERY Bilateral      No current facility-administered medications for this encounter.   Current Outpatient Prescriptions:  .  aspirin EC 81 MG tablet, Take 81 mg by mouth daily., Disp: , Rfl:  .  Biotin 1000 MCG tablet, Take 1,000 mcg by mouth daily., Disp: , Rfl:  .  Calcium-Vitamin D-Vitamin K (VIACTIV PO), Take 2 Doses by mouth every evening., Disp: , Rfl:  .  cetirizine (ZYRTEC) 10 MG tablet, Take 10 mg by mouth daily., Disp: , Rfl:  .  clonazePAM (KLONOPIN) 1 MG tablet, Take 1 mg by mouth at bedtime as needed (for sleep). , Disp: , Rfl:  .  cyanocobalamin (,VITAMIN B-12,) 1000 MCG/ML injection, Inject 1,000 mcg into  the muscle every 30 (thirty) days., Disp: , Rfl:  .  Cyanocobalamin (VITAMIN B-12) 6000 MCG SUBL, Place 6,000 mcg under the tongue daily., Disp: , Rfl:  .  diazepam (VALIUM) 2 MG tablet, Take 1 tablet (2 mg total) by mouth 2 (two) times daily., Disp: 3 tablet, Rfl: 0 .  diphenoxylate-atropine (LOMOTIL) 2.5-0.025 MG per tablet, Take 2 tablets by mouth every morning. , Disp: , Rfl:  .  folic acid (FOLVITE) 1 MG tablet, Take 1 mg by mouth daily., Disp: , Rfl:  .  furosemide (LASIX) 20 MG tablet, Take 20 mg by mouth every  morning. , Disp: , Rfl:  .  lidocaine (XYLOCAINE) 2 % solution, Use as directed 15 mLs in the mouth or throat every 8 (eight) hours as needed (sore throat. gargle and spit as needed for sore throat.)., Disp: 120 mL, Rfl: 0 .  magnesium oxide (MAG-OX) 400 MG tablet, Take 400 mg by mouth daily. , Disp: , Rfl:  .  METHOTREXATE SODIUM IJ, Inject 8 mg as directed once a week. Subcutaneous on Sundays, Disp: , Rfl:  .  Multiple Vitamins-Minerals (MULTIVITAMIN WITH MINERALS) tablet, Take 1 tablet by mouth daily., Disp: , Rfl:  .  omeprazole (PRILOSEC) 40 MG capsule, Take 40 mg by mouth every morning. , Disp: , Rfl:  .  oxyCODONE-acetaminophen (PERCOCET) 7.5-325 MG tablet, Take 1 tablet by mouth every 4 (four) hours as needed for severe pain., Disp: 20 tablet, Rfl: 0 .  potassium chloride SA (K-DUR,KLOR-CON) 20 MEQ tablet, Take 20 mEq by mouth daily. , Disp: , Rfl:  .  predniSONE (STERAPRED UNI-PAK 21 TAB) 10 MG (21) TBPK tablet, Dispense steroid taper pack as directed, Disp: 21 tablet, Rfl: 0 .  Probiotic Product (PROBIOTIC PO), Take 2 tablets by mouth every morning., Disp: , Rfl:  .  rOPINIRole (REQUIP) 0.5 MG tablet, Take 0.5 mg by mouth at bedtime as needed (for restless legs). , Disp: , Rfl:  .  sucralfate (CARAFATE) 1 g tablet, Take 1 g by mouth 3 (three) times daily with meals as needed (for stomach cramping)., Disp: , Rfl:  .  tapentadol (NUCYNTA) 50 MG tablet, Take 50 mg by mouth every 6 (six) hours as needed., Disp: , Rfl:   Allergies Sulfa antibiotics; Cefazolin; Cephalosporins; Enbrel [etanercept]; Humira [adalimumab]; Levaquin [levofloxacin in d5w]; Lorabid [loracarbef]; Meropenem; Norco [hydrocodone-acetaminophen]; and Primidone  Family History  Problem Relation Age of Onset  . Diabetes Mother   . Emphysema Mother   . Emphysema Father   . Breast cancer Neg Hx     Social History Social History  Substance Use Topics  . Smoking status: Never Smoker  . Smokeless tobacco: Never Used  .  Alcohol use No    Review of Systems Constitutional: No fever/chills ENT: AS above.  Cardiovascular: Denies chest pain. Respiratory: Denies shortness of breath. Gastrointestinal: No abdominal pain.  No nausea, no vomiting.   Musculoskeletal: Negative for back pain. Skin: Negative for rash.  ____________________________________________   PHYSICAL EXAM:  VITAL SIGNS: ED Triage Vitals  Enc Vitals Group     BP 05/03/17 1851 131/72     Pulse Rate 05/03/17 1851 90     Resp 05/03/17 1851 14     Temp 05/03/17 1851 99 F (37.2 C)     Temp Source 05/03/17 1851 Oral     SpO2 05/03/17 1851 99 %     Weight 05/03/17 1849 170 lb (77.1 kg)     Height 05/03/17 1849 5' 1"  (1.549 m)  Head Circumference --      Peak Flow --      Pain Score 05/03/17 1849 7     Pain Loc --      Pain Edu? --      Excl. in [place]? --    Constitutional: Alert and oriented. Well appearing and in no acute distress. Eyes: Conjunctivae are normal.  Head: Atraumatic. Mild sinus tenderness to palpation bilateral frontal and maxillary sinuses. No swelling. No erythema.  Ears: no erythema, normal TMs bilaterally.   Nose:Nasal congestion with clear rhinorrhea  Mouth/Throat: Mucous membranes are moist. Mild pharyngeal erythema. No tonsillar swelling or exudate.  Neck: No stridor.  No cervical spine tenderness to palpation. Hematological/Lymphatic/Immunilogical: No cervical lymphadenopathy. Cardiovascular: Normal rate, regular rhythm. Grossly normal heart sounds.  Good peripheral circulation. Respiratory: Normal respiratory effort.  No retractions. No wheezes, rales or rhonchi. Good air movement.  Musculoskeletal: Ambulatory with steady gait. No cervical, thoracic or lumbar tenderness to palpation. Neurologic:  Normal speech and language. No gait instability. Skin:  Skin appears warm, dry and intact. No rash noted. Psychiatric: Mood and affect are normal. Speech and behavior are  normal.  ___________________________________________   LABS (all labs ordered are listed, but only abnormal results are displayed)  Labs Reviewed  RAPID STREP SCREEN (NOT AT [facility])  CULTURE, GROUP A STREP [facility])    PROCEDURES Procedures    INITIAL IMPRESSION / ASSESSMENT AND PLAN / ED COURSE  Pertinent labs & imaging results that were available during my care of the patient were reviewed by me and considered in my medical decision making (see chart for details).  Well-appearing patient. No acute distress. Suspect viral upper respiratory infection, viral pharyngitis and otalgia. Quick strep negative, will culture. When necessary viscous lidocaine gargle. Continue home medications.Discussed indication, risks and benefits of medications with patient.  Discussed follow up with Primary care physician this week as needed. Discussed follow up and return parameters including no resolution or any worsening concerns. Patient verbalized understanding and agreed to plan.   ____________________________________________   FINAL CLINICAL IMPRESSION(S) / ED DIAGNOSES  Final diagnoses:  Upper respiratory tract infection, unspecified type  Acute otalgia, left  Acute pharyngitis, unspecified etiology     New Prescriptions   LIDOCAINE (XYLOCAINE) 2 % SOLUTION    Use as directed 15 mLs in the mouth or throat every 8 (eight) hours as needed (sore throat. gargle and spit as needed for sore throat.).    Note: This dictation was prepared with Dragon dictation along with smaller phrase technology. Any transcriptional errors that result from this process are unintentional.         Marylene Land, NP 05/03/17 1936

## 2017-05-03 NOTE — ED Triage Notes (Signed)
Patient c/o left ear pain, cough, congestion and sore throat that started last Friday.  Patient is currently on Doxycycline.

## 2017-05-04 ENCOUNTER — Ambulatory Visit: Payer: Medicare Other

## 2017-05-06 LAB — CULTURE, GROUP A STREP (THRC)

## 2017-05-31 ENCOUNTER — Ambulatory Visit (INDEPENDENT_AMBULATORY_CARE_PROVIDER_SITE_OTHER): Payer: Medicare Other | Admitting: Podiatry

## 2017-05-31 ENCOUNTER — Encounter: Payer: Self-pay | Admitting: Podiatry

## 2017-05-31 DIAGNOSIS — L600 Ingrowing nail: Secondary | ICD-10-CM | POA: Diagnosis not present

## 2017-05-31 DIAGNOSIS — L03031 Cellulitis of right toe: Secondary | ICD-10-CM

## 2017-05-31 DIAGNOSIS — M778 Other enthesopathies, not elsewhere classified: Secondary | ICD-10-CM

## 2017-05-31 DIAGNOSIS — M792 Neuralgia and neuritis, unspecified: Secondary | ICD-10-CM

## 2017-05-31 DIAGNOSIS — M779 Enthesopathy, unspecified: Secondary | ICD-10-CM

## 2017-05-31 DIAGNOSIS — M7751 Other enthesopathy of right foot: Secondary | ICD-10-CM | POA: Diagnosis not present

## 2017-05-31 MED ORDER — CLINDAMYCIN HCL 150 MG PO CAPS: 150.0000 mg | ORAL_CAPSULE | Freq: Three times a day (TID) | ORAL | 0 refills | Status: DC

## 2017-05-31 NOTE — Addendum Note (Signed)
Addended by: Clovis Riley E on: 05/31/2017 02:09 PM   Modules accepted: Orders

## 2017-05-31 NOTE — Progress Notes (Signed)
She presents today for follow-up of pain to the center aspect of the forefoot right. She also has pain to the hallux right sharply incurvated nail margins.  Objective: Vital signs are stable she is alert and oriented 3. Pulses are palpable area she has mild hammertoe deformity second digit right foot and pain on end range of motion of the second metatarsophalangeal joint. The hallux right does do straight sharply incurvated nails with erythema and edema and abscesses to the distalmost aspect of the note were.  Radiographs demonstrate basically normal osseous architecture  Assessment capsulitis second metatarsophalangeal joint right. Ingrown toenail hallux right. Paronychia abscess hallux right  Plan: Discussed etiology pathology conservative versus surgical therapies. Injected around the second metatarsophalangeal joint. She states that that was so painful she does not want having nail removed. A this point I will allow her to continue for 5 mg of prednisone daily.. I also provided her antibiotic. I will follow up with her a month or so to make sure she is doing well. We discussed proper shoe gear stretching exercises ice therapy.

## 2017-06-28 ENCOUNTER — Ambulatory Visit: Payer: Medicare Other | Admitting: Podiatry

## 2017-08-10 ENCOUNTER — Other Ambulatory Visit: Payer: Self-pay | Admitting: Family Medicine

## 2017-08-10 ENCOUNTER — Ambulatory Visit
Admission: RE | Admit: 2017-08-10 | Discharge: 2017-08-10 | Disposition: A | Discharge status: Home / Self Care | Payer: Medicare Other | Source: Ambulatory Visit | Attending: Family Medicine | Admitting: Family Medicine

## 2017-08-10 DIAGNOSIS — R51 Headache: Principal | ICD-10-CM

## 2017-08-10 DIAGNOSIS — R519 Headache, unspecified: Secondary | ICD-10-CM

## 2017-08-22 ENCOUNTER — Ambulatory Visit (INDEPENDENT_AMBULATORY_CARE_PROVIDER_SITE_OTHER): Payer: Medicare Other | Admitting: Obstetrics and Gynecology

## 2017-08-22 ENCOUNTER — Encounter: Payer: Self-pay | Admitting: Obstetrics and Gynecology

## 2017-08-22 VITALS — BP 124/80 | HR 101 | Ht 61.0 in | Wt 167.6 lb

## 2017-08-22 DIAGNOSIS — N941 Unspecified dyspareunia: Secondary | ICD-10-CM

## 2017-08-22 DIAGNOSIS — Z90722 Acquired absence of ovaries, bilateral: Secondary | ICD-10-CM | POA: Diagnosis not present

## 2017-08-22 DIAGNOSIS — Z01419 Encounter for gynecological examination (general) (routine) without abnormal findings: Secondary | ICD-10-CM

## 2017-08-22 DIAGNOSIS — Z9079 Acquired absence of other genital organ(s): Secondary | ICD-10-CM | POA: Diagnosis not present

## 2017-08-22 DIAGNOSIS — E669 Obesity, unspecified: Secondary | ICD-10-CM

## 2017-08-22 DIAGNOSIS — N952 Postmenopausal atrophic vaginitis: Secondary | ICD-10-CM | POA: Diagnosis not present

## 2017-08-22 DIAGNOSIS — Z90710 Acquired absence of both cervix and uterus: Secondary | ICD-10-CM

## 2017-08-22 MED ORDER — ESTROGENS, CONJUGATED 0.625 MG/GM VA CREA: 0.2500 | TOPICAL_CREAM | VAGINAL | 6 refills | Status: DC

## 2017-08-22 NOTE — Progress Notes (Signed)
ANNUAL PREVENTATIVE CARE GYN  ENCOUNTER NOTE  Subjective:       Nicole Bass is a 67 y.o. G45P2002 female here for a routine annual gynecologic exam.  Current complaints: 1.  Sex painful- premarin used only during IC  Prior patient of Dr. Bunnie Pion of [facility]. Status post cesarean section x2 Status post TAH/BSO for menorrhagia to anemia in 2005. No history of abnormal Pap smears. Recent marriage; no Pap smear since marriage; significant dyspareunia is noted with penetrance. No history of systemic ERT. History of intermittent vaginal estrogen therapy (Premarin cream) when sexually active only. Patient is taking calcium with vitamin D supplementation and is exercising routinely-water aerobics  Other significant comorbidities include: Rheumatoid arthritis Crohn's disease Chronic anemia Degenerative disc disease, cervical  Gynecologic History No LMP recorded. Patient has had a hysterectomy. Contraception: status post hysterectomy Last Pap: 2015 wnl Results were: normal Last mammogram: 03/2017  birad 1 Results were: normal  Obstetric History OB History  Gravida Para Term Preterm AB Living  2 2 2     2   SAB TAB Ectopic Multiple Live Births          2    # Outcome Date GA Lbr Len/2nd Weight Sex Delivery Anes PTL Lv  2 Term 1980   6 lb 1.6 oz (2.767 kg) M CS-LTranv   LIV  1 Term 1976   8 lb 9.6 oz (3.901 kg) F CS-LTranv   LIV      Past Medical History:  Diagnosis Date  . Anemia   . Arthritis   . Collagen vascular disease ([place])   . Crohn's disease ([place])   . DDD (degenerative disc disease), cervical   . DDD (degenerative disc disease), cervical   . DDD (degenerative disc disease), cervical 2003  . DDD (degenerative disc disease), lumbar   . Degenerative disc disease, cervical   . Difficult intubation     Past Surgical History:  Procedure Laterality Date  . ABDOMINAL HYSTERECTOMY    . APPENDECTOMY    . BACK SURGERY     x 3  . [place]  . CHOLECYSTECTOMY    . COLON SURGERY  1999   removed 18 inches of colon  . DILATION AND CURETTAGE OF UTERUS  2004  . ELBOW FRACTURE SURGERY  2008  . JOINT REPLACEMENT Right 2015   partial knee replacement  . KYPHOPLASTY    . KYPHOPLASTY N/A 04/14/2015   Procedure: KYPHOPLASTY;  Surgeon: Hessie Knows, MD;  Location: [facility] ORS;  Service: Orthopedics;  Laterality: N/A;  . KYPHOPLASTY N/A 06/04/2015   Procedure: KYPHOPLASTY L-5;  Surgeon: Hessie Knows, MD;  Location: [facility] ORS;  Service: Orthopedics;  Laterality: N/A;  . MEDIAL PARTIAL KNEE REPLACEMENT    . OOPHORECTOMY Bilateral   . SHOULDER ARTHROSCOPY WITH ROTATOR CUFF REPAIR AND SUBACROMIAL DECOMPRESSION Right 04/13/2016   Procedure: SHOULDER ARTHROSCOPY WITH ROTATOR CUFF REPAIR AND SUBACROMIAL DECOMPRESSION, release of long head biceps tendon;  Surgeon: Leanor Kail, MD;  Location: [facility] ORS;  Service: Orthopedics;  Laterality: Right;  . TOE SURGERY  2013  . TONSILLECTOMY    . WRIST FRACTURE SURGERY Bilateral     Current Outpatient Medications on File Prior to Visit  Medication Sig Dispense Refill  . aspirin EC 81 MG tablet Take 81 mg by mouth daily.    Marland Kitchen buPROPion (WELLBUTRIN SR) 150 MG 12 hr tablet Take 150 mg by mouth 2 (two) times daily.    . Calcium-Vitamin D-Vitamin K (VIACTIV  PO) Take 2 Doses by mouth every evening.    . cetirizine (ZYRTEC) 10 MG tablet Take 10 mg by mouth daily.    . cyanocobalamin (,VITAMIN B-12,) 1000 MCG/ML injection Inject 1,000 mcg into the muscle every 30 (thirty) days.    . Cyanocobalamin (VITAMIN B-12) 6000 MCG SUBL Place 6,000 mcg under the tongue daily.    . diphenoxylate-atropine (LOMOTIL) 2.5-0.025 MG per tablet Take 2 tablets by mouth every morning.     . folic acid (FOLVITE) 1 MG tablet Take 1 mg by mouth daily.    . furosemide (LASIX) 20 MG tablet Take 20 mg by mouth every morning.     . lidocaine (XYLOCAINE) 2 % solution Use as directed 15 mLs in the mouth or throat every 8 (eight)  hours as needed (sore throat. gargle and spit as needed for sore throat.). 120 mL 0  . magnesium oxide (MAG-OX) 400 MG tablet Take 400 mg by mouth daily.     Marland Kitchen METHOTREXATE SODIUM IJ Inject 8 mg as directed once a week. Subcutaneous on Sundays    . Multiple Vitamins-Minerals (MULTIVITAMIN WITH MINERALS) tablet Take 1 tablet by mouth daily.    Marland Kitchen omeprazole (PRILOSEC) 40 MG capsule Take 40 mg by mouth every morning.     . potassium chloride SA (K-DUR,KLOR-CON) 20 MEQ tablet Take 20 mEq by mouth daily.     . predniSONE (STERAPRED UNI-PAK 21 TAB) 10 MG (21) TBPK tablet Dispense steroid taper pack as directed 21 tablet 0  . Probiotic Product (PROBIOTIC PO) Take 2 tablets by mouth every morning.    Marland Kitchen rOPINIRole (REQUIP) 0.5 MG tablet Take 0.5 mg by mouth at bedtime as needed (for restless legs).     . sucralfate (CARAFATE) 1 g tablet Take 1 g by mouth 3 (three) times daily with meals as needed (for stomach cramping).    . traMADol (ULTRAM) 50 MG tablet Take by mouth every 6 (six) hours as needed.    . valACYclovir (VALTREX) 500 MG tablet Take 500 mg by mouth 2 (two) times daily.     No current facility-administered medications on file prior to visit.     Allergies  Allergen Reactions  . Ciprofloxacin Itching and Rash  . Levofloxacin Hives  . Gabapentin     Other reaction(s): Other (See Comments) Dry mouth  . Sulfa Antibiotics Other (See Comments)    Other reaction(s): Other (See Comments), Unknown Other reaction(s): Other (See Comments), Unknown Per pts MD she is unable to take this medication because it is contraindicated with her other medications. Per pts MD she is unable to take this medication because it is contraindicated with her other medications. Other reaction(s): Other (See Comments), Unknown Other reaction(s): Other (See Comments), Unknown Per pts MD she is unable to take this medication because it is contraindicated with her other medications. Per pts MD she is unable to  take this medication because it is contraindicated with her other medications. Other reaction(s): Other (See Comments), Unknown Per pts MD she is unable to take this medication because it is contraindicated with her other medications. Per pts MD she is unable to take this medication because it is contraindicated with her other medications.    . Cefazolin Itching and Rash  . Cefprozil Rash  . Cephalosporins Itching and Rash  . Enbrel [Etanercept] Itching and Rash  . Humira [Adalimumab] Itching and Rash  . Hydrocodone-Acetaminophen Itching and Rash  . Levaquin [Levofloxacin In D5w] Itching and Rash  . Lorabid [Loracarbef] Itching and  Rash  . Meropenem Itching and Rash  . Norco [Hydrocodone-Acetaminophen] Itching and Rash  . Primidone Itching and Rash    Social History   Socioeconomic History  . Marital status: Married    Spouse name: Not on file  . Number of children: Not on file  . Years of education: Not on file  . Highest education level: Not on file  Social Needs  . Financial resource strain: Not on file  . Food insecurity - worry: Not on file  . Food insecurity - inability: Not on file  . Transportation needs - medical: Not on file  . Transportation needs - non-medical: Not on file  Occupational History  . Not on file  Tobacco Use  . Smoking status: Never Smoker  . Smokeless tobacco: Never Used  Substance and Sexual Activity  . Alcohol use: No  . Drug use: No  . Sexual activity: Not on file  Other Topics Concern  . Not on file  Social History Narrative  . Not on file    Family History  Problem Relation Age of Onset  . Diabetes Mother   . Emphysema Mother   . Emphysema Father   . Breast cancer Neg Hx     The following portions of the patient's history were reviewed and updated as appropriate: allergies, current medications, past family history, past medical history, past social history, past surgical history and problem list.  Review of Systems Review of  Systems  Constitutional:       Mild hot flashes are present No night sweats  HENT: Negative.   Eyes: Negative.   Respiratory: Negative.   Cardiovascular: Negative.   Gastrointestinal: Negative.   Genitourinary: Negative.        Penetrance dyspareunia   Skin:       No vulvar itching  Neurological: Negative.   Endo/Heme/Allergies: Negative.   Psychiatric/Behavioral: Negative.     Objective:   BP 124/80   Pulse (!) 101   Ht 5' 1"  (1.549 m)   Wt 167 lb 9.6 oz (76 kg)   BMI 31.67 kg/m  CONSTITUTIONAL: Well-developed, well-nourished female in no acute distress.  PSYCHIATRIC: Normal mood and affect. Normal behavior. Normal judgment and thought content. [place]: Alert and oriented to person, place, and time. Normal muscle tone coordination. No cranial nerve deficit noted. HENT:  Normocephalic, atraumatic, External right and left ear normal. Oropharynx is clear and moist EYES: Conjunctivae and EOM are normal. No scleral icterus.  NECK: Normal range of motion, supple, no masses.  Normal thyroid.  SKIN: Skin is warm and dry. No rash noted. Not diaphoretic. No erythema. No pallor. CARDIOVASCULAR: Normal heart rate noted, regular rhythm, no murmur. RESPIRATORY: Clear to auscultation bilaterally. Effort and breath sounds normal, no problems with respiration noted. BREASTS: Symmetric in size. No masses, skin changes, nipple drainage, or lymphadenopathy. ABDOMEN: Soft, normal bowel sounds, no distention noted.  No tenderness, rebound or guarding.  BLADDER: Normal PELVIC:  External Genitalia: Atrophic changes; agglutination of the labia minora to labia majora  BUS: Normal  Vagina: Severe atrophy; introitus admits only single digit comfortably; depth of vagina is foreshortened-4 cm in length  Cervix: Surgically absent  Uterus: Surgically absent  Adnexa: Surgically absent  RV: External Exam NormaI, No Rectal Masses and Normal Sphincter tone  MUSCULOSKELETAL: Normal range of motion. No  tenderness.  No cyanosis, clubbing, or edema.  2+ distal pulses. LYMPHATIC: No Axillary, Supraclavicular, or Inguinal Adenopathy.    Assessment:   Annual gynecologic examination 67 y.o. Contraception: status  post hysterectomy status post TAH/BSO bmi- 31 Vaginal atrophy, severe Dyspareunia, penetrance No history of abnormal Pap smear; new partner  Plan:  Pap: pap/hpv Mammogram: thru pcp Stool Guaiac Testing:  colonosocpy 2018 - wnl Labs: thru pcp Routine preventative health maintenance measures emphasized: Exercise/Diet/Weight control, Tobacco Warnings and Alcohol/Substance use risks Premarin cream intravaginal 1/2 g nightly times 30 days, then twice weekly Return in 3 months for follow-up Possible candidate for vaginal dilator therapy Discussed referral to sexual therapist Return to [place], CMA  Brayton Mars, MD  Note: This dictation was prepared with Dragon dictation along with smaller phrase technology. Any transcriptional errors that result from this process are unintentional.

## 2017-08-22 NOTE — Patient Instructions (Addendum)
1.  Pap smear is done 2.  Mammogram already obtained this year 3.  Stool guaiac cards are not given for colon cancer screening because of colonoscopy in the past year 4.  Screening labs will be obtained through primary care 5.  Continue with healthy eating and exercise 6.  Recommend Premarin cream 1/2 g intravaginal nightly for 30 days, then twice weekly 7.  Return in 3 months for follow-up 8.  Return in 1 year for annual exam 9.  Lubricants to consider:  Virgin olive oil  Coconut oil  Astroglide  JO H2O lubricant   Health Maintenance for Postmenopausal Women Menopause is a normal process in which your reproductive ability comes to an end. This process happens gradually over a span of months to years, usually between the ages of 6 and 77. Menopause is complete when you have missed 12 consecutive menstrual periods. It is important to talk with your health care provider about some of the most common conditions that affect postmenopausal women, such as heart disease, cancer, and bone loss (osteoporosis). Adopting a healthy lifestyle and getting preventive care can help to promote your health and wellness. Those actions can also lower your chances of developing some of these common conditions. What should I know about menopause? During menopause, you may experience a number of symptoms, such as:  Moderate-to-severe hot flashes.  Night sweats.  Decrease in sex drive.  Mood swings.  Headaches.  Tiredness.  Irritability.  Memory problems.  Insomnia.  Choosing to treat or not to treat menopausal changes is an individual decision that you make with your health care provider. What should I know about hormone replacement therapy and supplements? Hormone therapy products are effective for treating symptoms that are associated with menopause, such as hot flashes and night sweats. Hormone replacement carries certain risks, especially as you become older. If you are thinking about using  estrogen or estrogen with progestin treatments, discuss the benefits and risks with your health care provider. What should I know about heart disease and stroke? Heart disease, heart attack, and stroke become more likely as you age. This may be due, in part, to the hormonal changes that your body experiences during menopause. These can affect how your body processes dietary fats, triglycerides, and cholesterol. Heart attack and stroke are both medical emergencies. There are many things that you can do to help prevent heart disease and stroke:  Have your blood pressure checked at least every 1-2 years. High blood pressure causes heart disease and increases the risk of stroke.  If you are 6-66 years old, ask your health care provider if you should take aspirin to prevent a heart attack or a stroke.  Do not use any tobacco products, including cigarettes, chewing tobacco, or electronic cigarettes. If you need help quitting, ask your health care provider.  It is important to eat a healthy diet and maintain a healthy weight. ? Be sure to include plenty of vegetables, fruits, low-fat dairy products, and lean protein. ? Avoid eating foods that are high in solid fats, added sugars, or salt (sodium).  Get regular exercise. This is one of the most important things that you can do for your health. ? Try to exercise for at least 150 minutes each week. The type of exercise that you do should increase your heart rate and make you sweat. This is known as moderate-intensity exercise. ? Try to do strengthening exercises at least twice each week. Do these in addition to the moderate-intensity exercise.  Know  your numbers.Ask your health care provider to check your cholesterol and your blood glucose. Continue to have your blood tested as directed by your health care provider.  What should I know about cancer screening? There are several types of cancer. Take the following steps to reduce your risk and to catch  any cancer development as early as possible. Breast Cancer  Practice breast self-awareness. ? This means understanding how your breasts normally appear and feel. ? It also means doing regular breast self-exams. Let your health care provider know about any changes, no matter how small.  If you are 32 or older, have a clinician do a breast exam (clinical breast exam or CBE) every year. Depending on your age, family history, and medical history, it may be recommended that you also have a yearly breast X-ray (mammogram).  If you have a family history of breast cancer, talk with your health care provider about genetic screening.  If you are at high risk for breast cancer, talk with your health care provider about having an MRI and a mammogram every year.  Breast cancer (BRCA) gene test is recommended for women who have family members with BRCA-related cancers. Results of the assessment will determine the need for genetic counseling and BRCA1 and for BRCA2 testing. BRCA-related cancers include these types: ? Breast. This occurs in males or females. ? Ovarian. ? Tubal. This may also be called fallopian tube cancer. ? Cancer of the abdominal or pelvic lining (peritoneal cancer). ? Prostate. ? Pancreatic.  Cervical, Uterine, and Ovarian Cancer Your health care provider may recommend that you be screened regularly for cancer of the pelvic organs. These include your ovaries, uterus, and vagina. This screening involves a pelvic exam, which includes checking for microscopic changes to the surface of your cervix (Pap test).  For women ages 21-65, health care providers may recommend a pelvic exam and a Pap test every three years. For women ages 17-65, they may recommend the Pap test and pelvic exam, combined with testing for human papilloma virus (HPV), every five years. Some types of HPV increase your risk of cervical cancer. Testing for HPV may also be done on women of any age who have unclear Pap test  results.  Other health care providers may not recommend any screening for nonpregnant women who are considered low risk for pelvic cancer and have no symptoms. Ask your health care provider if a screening pelvic exam is right for you.  If you have had past treatment for cervical cancer or a condition that could lead to cancer, you need Pap tests and screening for cancer for at least 20 years after your treatment. If Pap tests have been discontinued for you, your risk factors (such as having a new sexual partner) need to be reassessed to determine if you should start having screenings again. Some women have medical problems that increase the chance of getting cervical cancer. In these cases, your health care provider may recommend that you have screening and Pap tests more often.  If you have a family history of uterine cancer or ovarian cancer, talk with your health care provider about genetic screening.  If you have vaginal bleeding after reaching menopause, tell your health care provider.  There are currently no reliable tests available to screen for ovarian cancer.  Lung Cancer Lung cancer screening is recommended for adults 52-71 years old who are at high risk for lung cancer because of a history of smoking. A yearly low-dose CT scan of the lungs  is recommended if you:  Currently smoke.  Have a history of at least 30 pack-years of smoking and you currently smoke or have quit within the past 15 years. A pack-year is smoking an average of one pack of cigarettes per day for one year.  Yearly screening should:  Continue until it has been 15 years since you quit.  Stop if you develop a health problem that would prevent you from having lung cancer treatment.  Colorectal Cancer  This type of cancer can be detected and can often be prevented.  Routine colorectal cancer screening usually begins at age 58 and continues through age 43.  If you have risk factors for colon cancer, your health  care provider may recommend that you be screened at an earlier age.  If you have a family history of colorectal cancer, talk with your health care provider about genetic screening.  Your health care provider may also recommend using home test kits to check for hidden blood in your stool.  A small camera at the end of a tube can be used to examine your colon directly (sigmoidoscopy or colonoscopy). This is done to check for the earliest forms of colorectal cancer.  Direct examination of the colon should be repeated every 5-10 years until age 31. However, if early forms of precancerous polyps or small growths are found or if you have a family history or genetic risk for colorectal cancer, you may need to be screened more often.  Skin Cancer  Check your skin from head to toe regularly.  Monitor any moles. Be sure to tell your health care provider: ? About any new moles or changes in moles, especially if there is a change in a mole's shape or color. ? If you have a mole that is larger than the size of a pencil eraser.  If any of your family members has a history of skin cancer, especially at a young age, talk with your health care provider about genetic screening.  Always use sunscreen. Apply sunscreen liberally and repeatedly throughout the day.  Whenever you are outside, protect yourself by wearing long sleeves, pants, a wide-brimmed hat, and sunglasses.  What should I know about osteoporosis? Osteoporosis is a condition in which bone destruction happens more quickly than new bone creation. After menopause, you may be at an increased risk for osteoporosis. To help prevent osteoporosis or the bone fractures that can happen because of osteoporosis, the following is recommended:  If you are 97-48 years old, get at least 1,000 mg of calcium and at least 600 mg of vitamin D per day.  If you are older than age 76 but younger than age 72, get at least 1,200 mg of calcium and at least 600 mg of  vitamin D per day.  If you are older than age 70, get at least 1,200 mg of calcium and at least 800 mg of vitamin D per day.  Smoking and excessive alcohol intake increase the risk of osteoporosis. Eat foods that are rich in calcium and vitamin D, and do weight-bearing exercises several times each week as directed by your health care provider. What should I know about how menopause affects my mental health? Depression may occur at any age, but it is more common as you become older. Common symptoms of depression include:  Low or sad mood.  Changes in sleep patterns.  Changes in appetite or eating patterns.  Feeling an overall lack of motivation or enjoyment of activities that you previously enjoyed.  Frequent crying spells.  Talk with your health care provider if you think that you are experiencing depression. What should I know about immunizations? It is important that you get and maintain your immunizations. These include:  Tetanus, diphtheria, and pertussis (Tdap) booster vaccine.  Influenza every year before the flu season begins.  Pneumonia vaccine.  Shingles vaccine.  Your health care provider may also recommend other immunizations. This information is not intended to replace advice given to you by your health care provider. Make sure you discuss any questions you have with your health care provider. Document Released: 09/23/2005 Document Revised: 02/19/2016 Document Reviewed: 05/05/2015 Elsevier Interactive Patient Education  2018 [employer].

## 2017-08-24 LAB — IGP, COBASHPV16/18
HPV 16: NEGATIVE
HPV 18: NEGATIVE
HPV other hr types: NEGATIVE
PAP SMEAR COMMENT: 0

## 2017-09-04 ENCOUNTER — Other Ambulatory Visit: Payer: Self-pay

## 2017-09-04 ENCOUNTER — Encounter: Payer: Self-pay | Admitting: Emergency Medicine

## 2017-09-04 ENCOUNTER — Emergency Department
Admission: EM | Admit: 2017-09-04 | Discharge: 2017-09-04 | Disposition: A | Discharge status: Home / Self Care | Payer: Medicare Other | Source: Home / Self Care | Attending: Emergency Medicine | Admitting: Emergency Medicine

## 2017-09-04 DIAGNOSIS — K117 Disturbances of salivary secretion: Secondary | ICD-10-CM | POA: Diagnosis not present

## 2017-09-04 DIAGNOSIS — K50918 Crohn's disease, unspecified, with other complication: Secondary | ICD-10-CM | POA: Diagnosis present

## 2017-09-04 DIAGNOSIS — J309 Allergic rhinitis, unspecified: Secondary | ICD-10-CM | POA: Diagnosis not present

## 2017-09-04 DIAGNOSIS — Z881 Allergy status to other antibiotic agents status: Secondary | ICD-10-CM | POA: Diagnosis not present

## 2017-09-04 DIAGNOSIS — M069 Rheumatoid arthritis, unspecified: Secondary | ICD-10-CM | POA: Diagnosis present

## 2017-09-04 DIAGNOSIS — Z79890 Hormone replacement therapy (postmenopausal): Secondary | ICD-10-CM

## 2017-09-04 DIAGNOSIS — N390 Urinary tract infection, site not specified: Secondary | ICD-10-CM | POA: Diagnosis not present

## 2017-09-04 DIAGNOSIS — M810 Age-related osteoporosis without current pathological fracture: Secondary | ICD-10-CM | POA: Diagnosis not present

## 2017-09-04 DIAGNOSIS — T451X5A Adverse effect of antineoplastic and immunosuppressive drugs, initial encounter: Secondary | ICD-10-CM | POA: Diagnosis not present

## 2017-09-04 DIAGNOSIS — K120 Recurrent oral aphthae: Principal | ICD-10-CM | POA: Diagnosis present

## 2017-09-04 DIAGNOSIS — Z7982 Long term (current) use of aspirin: Secondary | ICD-10-CM

## 2017-09-04 DIAGNOSIS — Y92009 Unspecified place in unspecified non-institutional (private) residence as the place of occurrence of the external cause: Secondary | ICD-10-CM | POA: Diagnosis not present

## 2017-09-04 DIAGNOSIS — Z825 Family history of asthma and other chronic lower respiratory diseases: Secondary | ICD-10-CM

## 2017-09-04 DIAGNOSIS — K219 Gastro-esophageal reflux disease without esophagitis: Secondary | ICD-10-CM | POA: Diagnosis present

## 2017-09-04 DIAGNOSIS — Z96651 Presence of right artificial knee joint: Secondary | ICD-10-CM | POA: Insufficient documentation

## 2017-09-04 DIAGNOSIS — L270 Generalized skin eruption due to drugs and medicaments taken internally: Secondary | ICD-10-CM | POA: Diagnosis present

## 2017-09-04 DIAGNOSIS — K121 Other forms of stomatitis: Secondary | ICD-10-CM | POA: Diagnosis present

## 2017-09-04 DIAGNOSIS — Z800 Family history of malignant neoplasm of digestive organs: Secondary | ICD-10-CM | POA: Diagnosis not present

## 2017-09-04 DIAGNOSIS — E860 Dehydration: Secondary | ICD-10-CM | POA: Diagnosis not present

## 2017-09-04 DIAGNOSIS — T402X5A Adverse effect of other opioids, initial encounter: Secondary | ICD-10-CM | POA: Diagnosis not present

## 2017-09-04 DIAGNOSIS — Z833 Family history of diabetes mellitus: Secondary | ICD-10-CM

## 2017-09-04 DIAGNOSIS — R070 Pain in throat: Secondary | ICD-10-CM

## 2017-09-04 DIAGNOSIS — Z79899 Other long term (current) drug therapy: Secondary | ICD-10-CM | POA: Insufficient documentation

## 2017-09-04 DIAGNOSIS — R1310 Dysphagia, unspecified: Secondary | ICD-10-CM | POA: Diagnosis not present

## 2017-09-04 DIAGNOSIS — E039 Hypothyroidism, unspecified: Secondary | ICD-10-CM | POA: Diagnosis present

## 2017-09-04 DIAGNOSIS — Z888 Allergy status to other drugs, medicaments and biological substances status: Secondary | ICD-10-CM | POA: Diagnosis not present

## 2017-09-04 DIAGNOSIS — G8929 Other chronic pain: Secondary | ICD-10-CM

## 2017-09-04 LAB — URINALYSIS, COMPLETE (UACMP) WITH MICROSCOPIC
BACTERIA UA: NONE SEEN
BILIRUBIN URINE: NEGATIVE
GLUCOSE, UA: NEGATIVE mg/dL
HGB URINE DIPSTICK: NEGATIVE
Ketones, ur: 20 mg/dL — AB
Leukocytes, UA: NEGATIVE
Nitrite: NEGATIVE
PH: 5 (ref 5.0–8.0)
Protein, ur: 100 mg/dL — AB
SPECIFIC GRAVITY, URINE: 1.02 (ref 1.005–1.030)

## 2017-09-04 MED ORDER — NITROFURANTOIN MONOHYD MACRO 100 MG PO CAPS: 100.0000 mg | ORAL_CAPSULE | Freq: Two times a day (BID) | ORAL | 0 refills | Status: DC

## 2017-09-04 MED ORDER — HYDROCODONE-ACETAMINOPHEN 5-325 MG PO TABS
1.0000 | ORAL_TABLET | Freq: Once | ORAL | Status: AC
  Administered 2017-09-04: 1 via ORAL
  Filled 2017-09-04: qty 1

## 2017-09-04 MED ORDER — MAGIC MOUTHWASH
5.0000 mL | Freq: Once | ORAL | Status: AC
  Administered 2017-09-04: 5 mL via ORAL
  Filled 2017-09-04: qty 10

## 2017-09-04 MED ORDER — LIDOCAINE VISCOUS 2 % MT SOLN
15.0000 mL | Freq: Once | OROMUCOSAL | Status: DC
  Filled 2017-09-04: qty 15

## 2017-09-04 MED ORDER — HYDROCODONE-ACETAMINOPHEN 5-325 MG PO TABS: 1.0000 | ORAL_TABLET | ORAL | 0 refills | Status: DC | PRN

## 2017-09-04 NOTE — ED Notes (Signed)
See triage note  States she went to PCP and had flu test and labs drawn  Then was sent here   States she does not have cough  Afebrile on arrival  Just c/o's sore throat

## 2017-09-04 NOTE — ED Notes (Signed)
Went in to review discharge instructions  Requested to see provider again  States she has headache and she does not want to go home until she gets something for headache

## 2017-09-04 NOTE — ED Provider Notes (Signed)
[facility] Emergency Department Provider Note  ___________________________________________   First MD Initiated Contact with Patient 09/04/17 1107     (approximate)  I have reviewed the triage vital signs and the nursing notes.   HISTORY  Chief Complaint Generalized Body Aches and Chills   HPI Nicole Bass is a 67 y.o. female is  complaint of body aches and chills for the last 4 days.  Patient states that she was at [facility] having lab work done including a flu test when she felt bad and was told to come to the emergency department from there.  Patient is unaware of any fever.  She denies any nausea, vomiting, or diarrhea.  She states that there is been no respiratory symptoms and she has not been exposed to anyone with flu.  She states that each time she feels like this is because of the ulcers in her throat.  She is currently being seen by [facility] ENT for these ulcers and uses Magic mouthwash at home.  She states that the only thing that was not done at [facility] was a urinalysis.  She rates her pain as 7 out of 10.   Past Medical History:  Diagnosis Date  . Anemia   . Arthritis   . Collagen vascular disease ([place])   . Crohn's disease ([place])   . DDD (degenerative disc disease), cervical   . DDD (degenerative disc disease), cervical   . DDD (degenerative disc disease), cervical 2003  . DDD (degenerative disc disease), lumbar   . Degenerative disc disease, cervical   . Difficult intubation     Patient Active Problem List   Diagnosis Date Noted  . Dyspareunia in female 08/22/2017  . Vaginal atrophy 08/22/2017  . Obesity (BMI 30.0-34.9) 08/22/2017  . Crohn's colitis ([place]) 09/04/2015    Past Surgical History:  Procedure Laterality Date  . ABDOMINAL HYSTERECTOMY    . APPENDECTOMY    . BACK SURGERY     x 3  . [place]  . CHOLECYSTECTOMY    . COLON SURGERY  1999   removed 18 inches of colon  . DILATION AND  CURETTAGE OF UTERUS  2004  . ELBOW FRACTURE SURGERY  2008  . JOINT REPLACEMENT Right 2015   partial knee replacement  . KYPHOPLASTY    . KYPHOPLASTY N/A 04/14/2015   Procedure: KYPHOPLASTY;  Surgeon: Hessie Knows, MD;  Location: [facility] ORS;  Service: Orthopedics;  Laterality: N/A;  . KYPHOPLASTY N/A 06/04/2015   Procedure: KYPHOPLASTY L-5;  Surgeon: Hessie Knows, MD;  Location: [facility] ORS;  Service: Orthopedics;  Laterality: N/A;  . MEDIAL PARTIAL KNEE REPLACEMENT    . OOPHORECTOMY Bilateral   . SHOULDER ARTHROSCOPY WITH ROTATOR CUFF REPAIR AND SUBACROMIAL DECOMPRESSION Right 04/13/2016   Procedure: SHOULDER ARTHROSCOPY WITH ROTATOR CUFF REPAIR AND SUBACROMIAL DECOMPRESSION, release of long head biceps tendon;  Surgeon: Leanor Kail, MD;  Location: [facility] ORS;  Service: Orthopedics;  Laterality: Right;  . TOE SURGERY  2013  . TONSILLECTOMY    . WRIST FRACTURE SURGERY Bilateral     Prior to Admission medications   Medication Sig Start Date End Date Taking? Authorizing Provider  aspirin EC 81 MG tablet Take 81 mg by mouth daily.    [provider]  buPROPion (WELLBUTRIN SR) 150 MG 12 hr tablet Take 150 mg by mouth 2 (two) times daily.    [provider]  Calcium-Vitamin D-Vitamin K (VIACTIV PO) Take 2 Doses by mouth every evening.  [provider]  cetirizine (ZYRTEC) 10 MG tablet Take 10 mg by mouth daily.    [provider]  conjugated estrogens (PREMARIN) vaginal cream Place 3.26 Applicatorfuls vaginally 2 (two) times a week. 08/24/17   Defrancesco, Alanda Slim, MD  cyanocobalamin (,VITAMIN B-12,) 1000 MCG/ML injection Inject 1,000 mcg into the muscle every 30 (thirty) days.    [provider]  Cyanocobalamin (VITAMIN B-12) 6000 MCG SUBL Place 6,000 mcg under the tongue daily.    [provider]  diphenoxylate-atropine (LOMOTIL) 2.5-0.025 MG per tablet Take 2 tablets by mouth every morning.     [provider]  folic acid (FOLVITE)  1 MG tablet Take 1 mg by mouth daily.    [provider]  furosemide (LASIX) 20 MG tablet Take 20 mg by mouth every morning.     [provider]  magnesium oxide (MAG-OX) 400 MG tablet Take 400 mg by mouth daily.     [provider]  METHOTREXATE SODIUM IJ Inject 8 mg as directed once a week. Subcutaneous on Sundays    [provider]  Multiple Vitamins-Minerals (MULTIVITAMIN WITH MINERALS) tablet Take 1 tablet by mouth daily.    [provider]  nitrofurantoin, macrocrystal-monohydrate, (MACROBID) 100 MG capsule Take 1 capsule (100 mg total) by mouth 2 (two) times daily for 7 days. 09/04/17 09/11/17  Johnn Hai, PA-C  omeprazole (PRILOSEC) 40 MG capsule Take 40 mg by mouth every morning.     [provider]  potassium chloride SA (K-DUR,KLOR-CON) 20 MEQ tablet Take 20 mEq by mouth daily.     [provider]  Probiotic Product (PROBIOTIC PO) Take 2 tablets by mouth every morning.    [provider]  rOPINIRole (REQUIP) 0.5 MG tablet Take 0.5 mg by mouth at bedtime as needed (for restless legs).     [provider]  sucralfate (CARAFATE) 1 g tablet Take 1 g by mouth 3 (three) times daily with meals as needed (for stomach cramping).    [provider]  traMADol (ULTRAM) 50 MG tablet Take by mouth every 6 (six) hours as needed.    [provider]  valACYclovir (VALTREX) 500 MG tablet Take 500 mg by mouth 2 (two) times daily.    [provider]    Allergies Ciprofloxacin; Levofloxacin; Gabapentin; Sulfa antibiotics; Cefazolin; Cefprozil; Cephalosporins; Enbrel [etanercept]; Humira [adalimumab]; Hydrocodone-acetaminophen; Levaquin [levofloxacin in d5w]; Lorabid [loracarbef]; Meropenem; and Primidone  Family History  Problem Relation Age of Onset  . Diabetes Mother   . Emphysema Mother   . Emphysema Father   . Colon cancer Maternal Grandfather   . Breast cancer Neg Hx   . Ovarian cancer  Neg Hx     Social History Social History   Tobacco Use  . Smoking status: Never Smoker  . Smokeless tobacco: Never Used  Substance Use Topics  . Alcohol use: No  . Drug use: No    Review of Systems Constitutional: No fever/positive chills Eyes: No visual changes. ENT: Positive for throat pain.  Positive for chronic throat pain. Cardiovascular: Denies chest pain. Respiratory: Denies shortness of breath. Gastrointestinal: No abdominal pain.  No nausea, no vomiting.  Genitourinary: Negative for dysuria. Musculoskeletal: Positive for muscle aches. Skin: Negative for rash. Neurological: Negative for headaches, focal weakness or numbness. ___________________________________________   PHYSICAL EXAM:  VITAL SIGNS: ED Triage Vitals  Enc Vitals Group     BP --      Pulse --      Resp --  Temp 09/04/17 1051 97.8 F (36.6 C)     Temp Source 09/04/17 1051 Oral     SpO2 --      Weight 09/04/17 1050 150 lb (68 kg)     Height 09/04/17 1050 5' 1"  (1.549 m)     Head Circumference --      Peak Flow --      Pain Score 09/04/17 1049 7     Pain Loc --      Pain Edu? --      Excl. in [place]? --    Constitutional: Alert and oriented. Well appearing and in no acute distress. Eyes: Conjunctivae are normal.  Head: Atraumatic. Nose: No congestion/rhinnorhea. Mouth/Throat: Mucous membranes are moist.  Oropharynx non-erythematous.  There are white ulcerative lesions noted on the soft palate without edema.  No erythema noted.  Lesions appear to be shallow. Neck: No stridor.   Hematological/Lymphatic/Immunilogical: No cervical lymphadenopathy. Cardiovascular: Normal rate, regular rhythm. Grossly normal heart sounds.  Good peripheral circulation. Respiratory: Normal respiratory effort.  No retractions. Lungs CTAB. Gastrointestinal: Soft and nontender. No distention. Musculoskeletal: Moves upper and lower extremities without any difficulty.  Patient was able to walk to the restroom without  assistance. Neurologic:  Normal speech and language. No gross focal neurologic deficits are appreciated. No gait instability. Skin:  Skin is warm, dry and intact. No rash noted. Psychiatric: Mood and affect are normal. Speech and behavior are normal.  ____________________________________________   LABS (all labs ordered are listed, but only abnormal results are displayed)  Labs Reviewed  URINALYSIS, COMPLETE (UACMP) WITH MICROSCOPIC - Abnormal; Notable for the following components:      Result Value   Color, Urine AMBER (*)    APPearance HAZY (*)    Ketones, ur 20 (*)    Protein, ur 100 (*)    Squamous Epithelial / LPF 0-5 (*)    All other components within normal limits     PROCEDURES  Procedure(s) performed: None  Procedures  Critical Care performed: No  ____________________________________________   INITIAL IMPRESSION / ASSESSMENT AND PLAN / ED COURSE Patient was made aware that her influenza test at [facility] was negative.  It appears that blood work was sent out.  Patient does not want repeat lab work done in the ED but agreed to a urinalysis.  She states that there are no respiratory symptoms and did not want chest x-ray.  Patient was told that there is evidence of infection and that we would treat for a UTI.  Patient was given Norco in the department for body aches and headache.  Patient has been eating popsicles and ice at home.  We discussed also including Ensure or protein shakes.  She is instructed to call Dr. Pryor Ochoa  for continued follow-up of her throat problems since he is already seen her for this.  Patient states that she has Magic mouthwash at home and does not need a prescription for this.  Patient was placed on Macrobid twice daily for 7 days for her urinary tract infection.  Patient will also follow-up with her PCP if any continued problems.  Patient was ambulatory at the time of discharge without any  difficulties.  ____________________________________________   FINAL CLINICAL IMPRESSION(S) / ED DIAGNOSES  Final diagnoses:  Acute urinary tract infection  Chronic throat pain     ED Discharge Orders        Ordered    nitrofurantoin, macrocrystal-monohydrate, (MACROBID) 100 MG capsule  2 times daily     09/04/17 1245  HYDROcodone-acetaminophen (NORCO/VICODIN) 5-325 MG tablet  Every 4 hours PRN,   Status:  Discontinued     09/04/17 1307       Note:  This document was prepared using Dragon voice recognition software and may include unintentional dictation errors.    Johnn Hai, PA-C 09/04/17 1450    Nena Polio, MD 09/04/17 1610

## 2017-09-04 NOTE — ED Triage Notes (Signed)
States body aches and chills x 4 days.

## 2017-09-04 NOTE — ED Triage Notes (Signed)
FIRST NURSE NOTE-general malaise and tremors since Thursday. NAD. Alert at check in.

## 2017-09-04 NOTE — Discharge Instructions (Addendum)
Follow-up with your primary care doctor if any continued problems.  Begin taking Macrobid twice daily for 7 days.  Drink lots of liquids including ice chips and popsicles.  Also drink Ensure or protein shakes as we discussed.  Also follow-up with Dr. Pryor Ochoa about your throat.  Call and make an appointment.

## 2017-09-06 ENCOUNTER — Other Ambulatory Visit: Payer: Self-pay

## 2017-09-06 ENCOUNTER — Inpatient Hospital Stay
Admission: AD | Admit: 2017-09-06 | Discharge: 2017-09-08 | DRG: 158 | Disposition: A | Discharge status: Home / Self Care | Payer: Medicare Other | Source: Ambulatory Visit | Attending: Family Medicine | Admitting: Family Medicine

## 2017-09-06 DIAGNOSIS — K219 Gastro-esophageal reflux disease without esophagitis: Secondary | ICD-10-CM | POA: Diagnosis not present

## 2017-09-06 DIAGNOSIS — M810 Age-related osteoporosis without current pathological fracture: Secondary | ICD-10-CM | POA: Diagnosis not present

## 2017-09-06 DIAGNOSIS — K50918 Crohn's disease, unspecified, with other complication: Secondary | ICD-10-CM | POA: Diagnosis not present

## 2017-09-06 DIAGNOSIS — Z833 Family history of diabetes mellitus: Secondary | ICD-10-CM | POA: Diagnosis not present

## 2017-09-06 DIAGNOSIS — K121 Other forms of stomatitis: Secondary | ICD-10-CM | POA: Diagnosis not present

## 2017-09-06 DIAGNOSIS — N390 Urinary tract infection, site not specified: Secondary | ICD-10-CM | POA: Diagnosis not present

## 2017-09-06 DIAGNOSIS — E860 Dehydration: Secondary | ICD-10-CM | POA: Diagnosis not present

## 2017-09-06 DIAGNOSIS — Y92009 Unspecified place in unspecified non-institutional (private) residence as the place of occurrence of the external cause: Secondary | ICD-10-CM | POA: Diagnosis not present

## 2017-09-06 DIAGNOSIS — Z96651 Presence of right artificial knee joint: Secondary | ICD-10-CM | POA: Diagnosis not present

## 2017-09-06 DIAGNOSIS — M069 Rheumatoid arthritis, unspecified: Secondary | ICD-10-CM | POA: Diagnosis not present

## 2017-09-06 DIAGNOSIS — L270 Generalized skin eruption due to drugs and medicaments taken internally: Secondary | ICD-10-CM | POA: Diagnosis not present

## 2017-09-06 DIAGNOSIS — Z800 Family history of malignant neoplasm of digestive organs: Secondary | ICD-10-CM | POA: Diagnosis not present

## 2017-09-06 DIAGNOSIS — E039 Hypothyroidism, unspecified: Secondary | ICD-10-CM | POA: Diagnosis not present

## 2017-09-06 DIAGNOSIS — R1310 Dysphagia, unspecified: Secondary | ICD-10-CM | POA: Diagnosis not present

## 2017-09-06 DIAGNOSIS — T451X5A Adverse effect of antineoplastic and immunosuppressive drugs, initial encounter: Secondary | ICD-10-CM | POA: Diagnosis not present

## 2017-09-06 DIAGNOSIS — T402X5A Adverse effect of other opioids, initial encounter: Secondary | ICD-10-CM | POA: Diagnosis not present

## 2017-09-06 DIAGNOSIS — K120 Recurrent oral aphthae: Secondary | ICD-10-CM | POA: Diagnosis present

## 2017-09-06 DIAGNOSIS — J309 Allergic rhinitis, unspecified: Secondary | ICD-10-CM | POA: Diagnosis not present

## 2017-09-06 DIAGNOSIS — Z825 Family history of asthma and other chronic lower respiratory diseases: Secondary | ICD-10-CM | POA: Diagnosis not present

## 2017-09-06 LAB — CBC WITH DIFFERENTIAL/PLATELET
Basophils Absolute: 0 10*3/uL (ref 0–0.1)
Basophils Relative: 0 %
EOS ABS: 0 10*3/uL (ref 0–0.7)
Eosinophils Relative: 0 %
HCT: 31.1 % — ABNORMAL LOW (ref 35.0–47.0)
HEMOGLOBIN: 10.3 g/dL — AB (ref 12.0–16.0)
LYMPHS ABS: 0.4 10*3/uL — AB (ref 1.0–3.6)
Lymphocytes Relative: 2 %
MCH: 30.7 pg (ref 26.0–34.0)
MCHC: 33.2 g/dL (ref 32.0–36.0)
MCV: 92.4 fL (ref 80.0–100.0)
Monocytes Absolute: 0.4 10*3/uL (ref 0.2–0.9)
Monocytes Relative: 3 %
NEUTROS ABS: 15.6 10*3/uL — AB (ref 1.4–6.5)
NEUTROS PCT: 95 %
Platelets: 298 10*3/uL (ref 150–440)
RBC: 3.36 MIL/uL — AB (ref 3.80–5.20)
RDW: 16.5 % — ABNORMAL HIGH (ref 11.5–14.5)
WBC: 16.4 10*3/uL — AB (ref 3.6–11.0)

## 2017-09-06 LAB — COMPREHENSIVE METABOLIC PANEL
ALT: 43 U/L (ref 14–54)
ANION GAP: 17 — AB (ref 5–15)
AST: 64 U/L — ABNORMAL HIGH (ref 15–41)
Albumin: 3.5 g/dL (ref 3.5–5.0)
Alkaline Phosphatase: 124 U/L (ref 38–126)
BUN: 21 mg/dL — ABNORMAL HIGH (ref 6–20)
CHLORIDE: 101 mmol/L (ref 101–111)
CO2: 22 mmol/L (ref 22–32)
CREATININE: 1.17 mg/dL — AB (ref 0.44–1.00)
Calcium: 9.4 mg/dL (ref 8.9–10.3)
GFR, EST AFRICAN AMERICAN: 56 mL/min — AB (ref >60)
GFR, EST NON AFRICAN AMERICAN: 48 mL/min — AB (ref >60)
Glucose, Bld: 123 mg/dL — ABNORMAL HIGH (ref 65–99)
Potassium: 3.1 mmol/L — ABNORMAL LOW (ref 3.5–5.1)
Sodium: 140 mmol/L (ref 135–145)
Total Bilirubin: 1.4 mg/dL — ABNORMAL HIGH (ref 0.3–1.2)
Total Protein: 7.5 g/dL (ref 6.5–8.1)

## 2017-09-06 MED ORDER — CHLORHEXIDINE GLUCONATE 0.12 % MT SOLN
15.0000 mL | Freq: Four times a day (QID) | OROMUCOSAL | Status: DC
  Administered 2017-09-06 – 2017-09-08 (×7): 15 mL via OROMUCOSAL
  Filled 2017-09-06 (×7): qty 15

## 2017-09-06 MED ORDER — VITAMIN B-12 1000 MCG PO TABS
5000.0000 ug | ORAL_TABLET | Freq: Every day | ORAL | Status: DC
  Administered 2017-09-06: 5000 ug via ORAL
  Filled 2017-09-06 (×3): qty 5

## 2017-09-06 MED ORDER — ONDANSETRON HCL 4 MG PO TABS: 4.0000 mg | ORAL_TABLET | Freq: Four times a day (QID) | ORAL | Status: DC | PRN

## 2017-09-06 MED ORDER — POTASSIUM CHLORIDE CRYS ER 20 MEQ PO TBCR
20.0000 meq | EXTENDED_RELEASE_TABLET | Freq: Every day | ORAL | Status: DC
  Administered 2017-09-06 – 2017-09-08 (×3): 20 meq via ORAL
  Filled 2017-09-06 (×3): qty 1

## 2017-09-06 MED ORDER — SUCRALFATE 1 G PO TABS: 1.0000 g | ORAL_TABLET | Freq: Three times a day (TID) | ORAL | Status: DC | PRN

## 2017-09-06 MED ORDER — DOCUSATE SODIUM 100 MG PO CAPS
100.0000 mg | ORAL_CAPSULE | Freq: Two times a day (BID) | ORAL | Status: DC
  Administered 2017-09-07: 100 mg via ORAL
  Filled 2017-09-06 (×3): qty 1

## 2017-09-06 MED ORDER — DIPHENOXYLATE-ATROPINE 2.5-0.025 MG PO TABS
2.0000 | ORAL_TABLET | ORAL | Status: DC
  Administered 2017-09-07 – 2017-09-08 (×2): 2 via ORAL
  Filled 2017-09-06 (×2): qty 2

## 2017-09-06 MED ORDER — ONDANSETRON HCL 4 MG/2ML IJ SOLN
4.0000 mg | Freq: Four times a day (QID) | INTRAMUSCULAR | Status: DC | PRN
  Administered 2017-09-06: 4 mg via INTRAVENOUS
  Filled 2017-09-06: qty 2

## 2017-09-06 MED ORDER — ROPINIROLE HCL 1 MG PO TABS: 0.5000 mg | ORAL_TABLET | Freq: Every evening | ORAL | Status: DC | PRN

## 2017-09-06 MED ORDER — FOLIC ACID 1 MG PO TABS
1.0000 mg | ORAL_TABLET | Freq: Every day | ORAL | Status: DC
  Administered 2017-09-06 – 2017-09-08 (×3): 1 mg via ORAL
  Filled 2017-09-06 (×3): qty 1

## 2017-09-06 MED ORDER — VALACYCLOVIR HCL 500 MG PO TABS
500.0000 mg | ORAL_TABLET | Freq: Two times a day (BID) | ORAL | Status: DC
  Administered 2017-09-06 – 2017-09-08 (×4): 500 mg via ORAL
  Filled 2017-09-06 (×4): qty 1

## 2017-09-06 MED ORDER — ACETAMINOPHEN 325 MG PO TABS
650.0000 mg | ORAL_TABLET | Freq: Four times a day (QID) | ORAL | Status: DC | PRN
  Administered 2017-09-07: 650 mg via ORAL
  Filled 2017-09-06: qty 2

## 2017-09-06 MED ORDER — TRAMADOL HCL 50 MG PO TABS
50.0000 mg | ORAL_TABLET | Freq: Four times a day (QID) | ORAL | Status: DC | PRN
  Administered 2017-09-06: 50 mg via ORAL
  Filled 2017-09-06: qty 1

## 2017-09-06 MED ORDER — CALCIUM CARBONATE-VITAMIN D 500-200 MG-UNIT PO TABS
1.0000 | ORAL_TABLET | Freq: Every evening | ORAL | Status: DC
  Administered 2017-09-06 – 2017-09-07 (×2): 1 via ORAL
  Filled 2017-09-06 (×2): qty 1

## 2017-09-06 MED ORDER — ACETAMINOPHEN 650 MG RE SUPP: 650.0000 mg | Freq: Four times a day (QID) | RECTAL | Status: DC | PRN

## 2017-09-06 MED ORDER — BUPROPION HCL ER (SR) 150 MG PO TB12
150.0000 mg | ORAL_TABLET | Freq: Two times a day (BID) | ORAL | Status: DC
  Administered 2017-09-07 – 2017-09-08 (×3): 150 mg via ORAL
  Filled 2017-09-06 (×4): qty 1

## 2017-09-06 MED ORDER — ENOXAPARIN SODIUM 40 MG/0.4ML ~~LOC~~ SOLN
40.0000 mg | SUBCUTANEOUS | Status: DC
  Administered 2017-09-07: 40 mg via SUBCUTANEOUS
  Filled 2017-09-06: qty 0.4

## 2017-09-06 MED ORDER — FUROSEMIDE 20 MG PO TABS
20.0000 mg | ORAL_TABLET | ORAL | Status: DC
  Administered 2017-09-07 – 2017-09-08 (×2): 20 mg via ORAL
  Filled 2017-09-06 (×2): qty 1

## 2017-09-06 MED ORDER — OXYCODONE HCL 5 MG PO TABS: 5.0000 mg | ORAL_TABLET | ORAL | Status: DC | PRN

## 2017-09-06 MED ORDER — PANTOPRAZOLE SODIUM 40 MG PO TBEC
40.0000 mg | DELAYED_RELEASE_TABLET | Freq: Every day | ORAL | Status: DC
  Administered 2017-09-06 – 2017-09-08 (×3): 40 mg via ORAL
  Filled 2017-09-06 (×3): qty 1

## 2017-09-06 MED ORDER — ADULT MULTIVITAMIN W/MINERALS CH
1.0000 | ORAL_TABLET | Freq: Every day | ORAL | Status: DC
  Administered 2017-09-06 – 2017-09-08 (×3): 1 via ORAL
  Filled 2017-09-06 (×3): qty 1

## 2017-09-06 MED ORDER — ASPIRIN EC 81 MG PO TBEC
81.0000 mg | DELAYED_RELEASE_TABLET | Freq: Every day | ORAL | Status: DC
  Administered 2017-09-06 – 2017-09-08 (×3): 81 mg via ORAL
  Filled 2017-09-06 (×3): qty 1

## 2017-09-06 MED ORDER — LACTATED RINGERS IV SOLN
INTRAVENOUS | Status: DC
  Administered 2017-09-06: 19:00:00 via INTRAVENOUS

## 2017-09-06 MED ORDER — MAGNESIUM OXIDE 400 (241.3 MG) MG PO TABS
400.0000 mg | ORAL_TABLET | Freq: Every day | ORAL | Status: DC
  Administered 2017-09-06 – 2017-09-08 (×3): 400 mg via ORAL
  Filled 2017-09-06 (×3): qty 1

## 2017-09-06 MED ORDER — LORATADINE 10 MG PO TABS
10.0000 mg | ORAL_TABLET | Freq: Every day | ORAL | Status: DC
  Administered 2017-09-06 – 2017-09-07 (×2): 10 mg via ORAL
  Filled 2017-09-06 (×2): qty 1

## 2017-09-06 MED ORDER — LIDOCAINE VISCOUS 2 % MT SOLN
15.0000 mL | OROMUCOSAL | Status: DC | PRN
  Administered 2017-09-06 (×2): 15 mL via OROMUCOSAL
  Filled 2017-09-06 (×3): qty 15

## 2017-09-06 MED ORDER — MORPHINE SULFATE (PF) 2 MG/ML IV SOLN
2.0000 mg | INTRAVENOUS | Status: DC | PRN
  Administered 2017-09-06 – 2017-09-07 (×2): 2 mg via INTRAVENOUS
  Filled 2017-09-06 (×2): qty 1

## 2017-09-06 MED ORDER — NITROFURANTOIN MONOHYD MACRO 100 MG PO CAPS
100.0000 mg | ORAL_CAPSULE | Freq: Two times a day (BID) | ORAL | Status: DC
  Administered 2017-09-06 – 2017-09-07 (×2): 100 mg via ORAL
  Filled 2017-09-06 (×3): qty 1

## 2017-09-06 NOTE — Progress Notes (Signed)
Nicole, Bass [id number] Oct 18, 1950 Bettey Costa, MD  Reason for Consult: mouth ulcerations  HPI: 67 y.o. Female presents with severe mouth pain and dehydration following worsening mouth ulcers.  Initially evaluated and found to have aphthous stomatitis and treated with doxycycline/steroids/magic mouth wash.  This improved but patient presented to my office yesterday with major aphthous stomatitis presumed secondary to methotrexate injections as previously seems to occur shortly afterwards.  Patient is on methotrexate/prednisone for RA/Chron's disease.  Saw PCP on 09/04/2017 and sent to ER due to severity of ulcerations.  Evaluated by ER and told to follow up with me.  I saw patient on 09/05/2017 and gave patient liquid hydrocodone/viscous lidocaine/oral steroids/doxycycline mouthwash.  Patient still is unable to tolerate PO and is in severe pain.  Was unable to tolerate hydrocodone due to rash/hives.  Currently on Macrodantin due to UTI.  Allergies:  Allergies  Allergen Reactions  . Ciprofloxacin Itching and Rash  . Levofloxacin Hives  . Gabapentin     Other reaction(s): Other (See Comments) Dry mouth  . Sulfa Antibiotics Other (See Comments)    Other reaction(s): Other (See Comments), Unknown Other reaction(s): Other (See Comments), Unknown Per pts MD she is unable to take this medication because it is contraindicated with her other medications. Per pts MD she is unable to take this medication because it is contraindicated with her other medications. Other reaction(s): Other (See Comments), Unknown Other reaction(s): Other (See Comments), Unknown Per pts MD she is unable to take this medication because it is contraindicated with her other medications. Per pts MD she is unable to take this medication because it is contraindicated with her other medications. Other reaction(s): Other (See Comments), Unknown Per pts MD she is unable to take this medication because it is  contraindicated with her other medications. Per pts MD she is unable to take this medication because it is contraindicated with her other medications.    . Cefazolin Itching and Rash  . Cefprozil Rash  . Cephalosporins Itching and Rash  . Enbrel [Etanercept] Itching and Rash  . Humira [Adalimumab] Itching and Rash  . Hydrocodone-Acetaminophen Itching and Rash  . Levaquin [Levofloxacin In D5w] Itching and Rash  . Lorabid [Loracarbef] Itching and Rash  . Meropenem Itching and Rash  . Primidone Itching and Rash    ROS: Review of systems normal other than 12 systems except per HPI.  PMH:  Past Medical History:  Diagnosis Date  . Anemia   . Arthritis   . Collagen vascular disease ([place])   . Crohn's disease ([place])   . DDD (degenerative disc disease), cervical   . DDD (degenerative disc disease), cervical   . DDD (degenerative disc disease), cervical 2003  . DDD (degenerative disc disease), lumbar   . Degenerative disc disease, cervical   . Difficult intubation     FH:  Family History  Problem Relation Age of Onset  . Diabetes Mother   . Emphysema Mother   . Emphysema Father   . Colon cancer Maternal Grandfather   . Breast cancer Neg Hx   . Ovarian cancer Neg Hx     SH:  Social History   Socioeconomic History  . Marital status: Married    Spouse name: Not on file  . Number of children: Not on file  . Years of education: Not on file  . Highest education level: Not on file  Social Needs  . Financial resource strain: Not on file  . Food insecurity - worry: Not on file  .  Food insecurity - inability: Not on file  . Transportation needs - medical: Not on file  . Transportation needs - non-medical: Not on file  Occupational History  . Not on file  Tobacco Use  . Smoking status: Never Smoker  . Smokeless tobacco: Never Used  Substance and Sexual Activity  . Alcohol use: No  . Drug use: No  . Sexual activity: Yes    Birth control/protection: Surgical  Other Topics  Concern  . Not on file  Social History Narrative  . Not on file    PSH:  Past Surgical History:  Procedure Laterality Date  . ABDOMINAL HYSTERECTOMY    . APPENDECTOMY    . BACK SURGERY     x 3  . [place]  . CHOLECYSTECTOMY    . COLON SURGERY  1999   removed 18 inches of colon  . DILATION AND CURETTAGE OF UTERUS  2004  . ELBOW FRACTURE SURGERY  2008  . JOINT REPLACEMENT Right 2015   partial knee replacement  . KYPHOPLASTY    . KYPHOPLASTY N/A 04/14/2015   Procedure: KYPHOPLASTY;  Surgeon: Hessie Knows, MD;  Location: [facility] ORS;  Service: Orthopedics;  Laterality: N/A;  . KYPHOPLASTY N/A 06/04/2015   Procedure: KYPHOPLASTY L-5;  Surgeon: Hessie Knows, MD;  Location: [facility] ORS;  Service: Orthopedics;  Laterality: N/A;  . MEDIAL PARTIAL KNEE REPLACEMENT    . OOPHORECTOMY Bilateral   . SHOULDER ARTHROSCOPY WITH ROTATOR CUFF REPAIR AND SUBACROMIAL DECOMPRESSION Right 04/13/2016   Procedure: SHOULDER ARTHROSCOPY WITH ROTATOR CUFF REPAIR AND SUBACROMIAL DECOMPRESSION, release of long head biceps tendon;  Surgeon: Leanor Kail, MD;  Location: [facility] ORS;  Service: Orthopedics;  Laterality: Right;  . TOE SURGERY  2013  . TONSILLECTOMY    . WRIST FRACTURE SURGERY Bilateral     Physical  Exam:  GEN-  CN 2-12 grossly intact and symmetric. Ears- EAC/TMs normal BL. OC/OP-  Multiple oral cavity ulcerations involving majority of soft palate and posterior oropharynx.  Airway widely patent. EXT- Skin warm and dry. Nasal cavity without polyps or purulence. External nose and ears without masses or lesions. EYES- EOMI, PERRLA.  NECK- Neck supple with no masses or lesions. No lymphadenopathy palpated. Thyroid normal with no masses.   A/P: Stomatitis presumed secondary from methotrexate with worsening pain/decreased PO  1)  Appreciate medicine asistance.  Slight improvement in exam from yesterday so hopefully will improve quickly with hydration/improved pain control.   2)   Will start viscous lidocaine and chlorhexidine mouthwash 3)  Will discuss stopping Methotrexate with Rheumatology 4)  Diagnosed with UTI from ER and will defer to [facility] regarding continuation of medication.   Nicole Bass 09/06/2017 4:15 PM

## 2017-09-06 NOTE — Progress Notes (Signed)
Patient refused 2200 Colace and BuPROPion dose.

## 2017-09-06 NOTE — H&P (Signed)
[facility] [place] NAME: Nicole Bass    MR#:  [id number]  DATE OF BIRTH:  1951-06-24  DATE OF ADMISSION:  09/06/2017  PRIMARY CARE PHYSICIAN: [name], MD   REQUESTING/REFERRING PHYSICIAN:   CHIEF COMPLAINT:  No chief complaint on file.   HISTORY OF PRESENT ILLNESS: Nicole Bass  is a 67 y.o. female with a known history per below, chronic aphthous ulcerations for decades, exacerbated by methotrexate use over the last 2-3 years, accepted as a direct admit by ENT given inability to tolerate p.o., patient resting comfortably in bed, family at the bedside, patient is now being admitted for acute on chronic aphthous ulcerations most likely due to Crohn's which is exacerbated by methotrexate use.  PAST MEDICAL HISTORY:   Past Medical History:  Diagnosis Date  . Anemia   . Arthritis   . Collagen vascular disease ([place])   . Crohn's disease ([place])   . DDD (degenerative disc disease), cervical   . DDD (degenerative disc disease), cervical   . DDD (degenerative disc disease), cervical 2003  . DDD (degenerative disc disease), lumbar   . Degenerative disc disease, cervical   . Difficult intubation     PAST SURGICAL HISTORY:  Past Surgical History:  Procedure Laterality Date  . ABDOMINAL HYSTERECTOMY    . APPENDECTOMY    . BACK SURGERY     x 3  . [place]  . CHOLECYSTECTOMY    . COLON SURGERY  1999   removed 18 inches of colon  . DILATION AND CURETTAGE OF UTERUS  2004  . ELBOW FRACTURE SURGERY  2008  . JOINT REPLACEMENT Right 2015   partial knee replacement  . KYPHOPLASTY    . KYPHOPLASTY N/A 04/14/2015   Procedure: KYPHOPLASTY;  Surgeon: Hessie Knows, MD;  Location: [facility] OR[facility];  Service: Orthopedics;  Laterality: N/A;  . KYPHOPLASTY N/A 06/04/2015   Procedure: KYPHOPLASTY L-5;  Surgeon: Hessie Knows, MD;  Location: [facility];  Service: Orthopedics;  Laterality: N/A;  . MEDIAL PARTIAL KNEE REPLACEMENT     . OOPHORECTOMY Bilateral   . SHOULDER ARTHROSCOPY WITH ROTATOR CUFF REPAIR AND SUBACROMIAL DECOMPRESSION Right 04/13/2016   Procedure: SHOULDER ARTHROSCOPY WITH ROTATOR CUFF REPAIR AND SUBACROMIAL DECOMPRESSION, release of long head biceps tendon;  Surgeon: Leanor Kail, MD;  Location: [facility];  Service: Orthopedics;  Laterality: Right;  . TOE SURGERY  2013  . TONSILLECTOMY    . WRIST FRACTURE SURGERY Bilateral     SOCIAL HISTORY:  Social History   Tobacco Use  . Smoking status: Never Smoker  . Smokeless tobacco: Never Used  Substance Use Topics  . Alcohol use: No    FAMILY HISTORY:  Family History  Problem Relation Age of Onset  . Diabetes Mother   . Emphysema Mother   . Emphysema Father   . Colon cancer Maternal Grandfather   . Breast cancer Neg Hx   . Ovarian cancer Neg Hx     DRUG ALLERGIES:  Allergies  Allergen Reactions  . Ciprofloxacin Itching and Rash  . Levofloxacin Hives  . Gabapentin     Other reaction(s): Other (See Comments) Dry mouth  . Sulfa Antibiotics Other (See Comments)    Other reaction(s): Other (See Comments), Unknown Other reaction(s): Other (See Comments), Unknown Per pts MD she is unable to take this medication because it is contraindicated with her other medications. Per pts MD she is unable to take this medication because it is contraindicated with  her other medications. Other reaction(s): Other (See Comments), Unknown Other reaction(s): Other (See Comments), Unknown Per pts MD she is unable to take this medication because it is contraindicated with her other medications. Per pts MD she is unable to take this medication because it is contraindicated with her other medications. Other reaction(s): Other (See Comments), Unknown Per pts MD she is unable to take this medication because it is contraindicated with her other medications. Per pts MD she is unable to take this medication because it is contraindicated with her other  medications.    . Cefazolin Itching and Rash  . Cefprozil Rash  . Cephalosporins Itching and Rash  . Enbrel [Etanercept] Itching and Rash  . Humira [Adalimumab] Itching and Rash  . Hydrocodone-Acetaminophen Itching and Rash  . Levaquin [Levofloxacin In D5w] Itching and Rash  . Lorabid [Loracarbef] Itching and Rash  . Meropenem Itching and Rash  . Primidone Itching and Rash    REVIEW OF SYSTEMS:   CONSTITUTIONAL: No fever, fatigue or weakness.  EYES: No blurred or double vision.  EARS, NOSE, AND THROAT: No tinnitus or ear pain.  Mouth pain, mouth sores, pain with swallowing, inability to eat due to pain RESPIRATORY: No cough, shortness of breath, wheezing or hemoptysis.  CARDIOVASCULAR: No chest pain, orthopnea, edema.  GASTROINTESTINAL: No nausea, vomiting, diarrhea or abdominal pain.  GENITOURINARY: No dysuria, hematuria.  ENDOCRINE: No polyuria, nocturia,  HEMATOLOGY: No anemia, easy bruising or bleeding SKIN: No rash or lesion. MUSCULOSKELETAL: No joint pain or arthritis.   NEUROLOGIC: No tingling, numbness, weakness.  PSYCHIATRY: No anxiety or depression.   MEDICATIONS AT HOME:  Prior to Admission medications   Medication Sig Start Date End Date Taking? Authorizing Provider  aspirin EC 81 MG tablet Take 81 mg by mouth daily.    [provider]  buPROPion (WELLBUTRIN SR) 150 MG 12 hr tablet Take 150 mg by mouth 2 (two) times daily.    [provider]  Calcium-Vitamin D-Vitamin K (VIACTIV PO) Take 2 Doses by mouth every evening.    [provider]  cetirizine (ZYRTEC) 10 MG tablet Take 10 mg by mouth daily.    [provider]  conjugated estrogens (PREMARIN) vaginal cream Place 7.34 Applicatorfuls vaginally 2 (two) times a week. 08/24/17   Defrancesco, Alanda Slim, MD  cyanocobalamin (,VITAMIN B-12,) 1000 MCG/ML injection Inject 1,000 mcg into the muscle every 30 (thirty) days.    [provider]  Cyanocobalamin (VITAMIN B-12) 6000 MCG  SUBL Place 6,000 mcg under the tongue daily.    [provider]  diphenoxylate-atropine (LOMOTIL) 2.5-0.025 MG per tablet Take 2 tablets by mouth every morning.     [provider]  folic acid (FOLVITE) 1 MG tablet Take 1 mg by mouth daily.    [provider]  furosemide (LASIX) 20 MG tablet Take 20 mg by mouth every morning.     [provider]  magnesium oxide (MAG-OX) 400 MG tablet Take 400 mg by mouth daily.     [provider]  METHOTREXATE SODIUM IJ Inject 8 mg as directed once a week. Subcutaneous on Sundays    [provider]  Multiple Vitamins-Minerals (MULTIVITAMIN WITH MINERALS) tablet Take 1 tablet by mouth daily.    [provider]  nitrofurantoin, macrocrystal-monohydrate, (MACROBID) 100 MG capsule Take 1 capsule (100 mg total) by mouth 2 (two) times daily for 7 days. 09/04/17 09/11/17  Johnn Hai, PA-C  omeprazole (PRILOSEC) 40 MG capsule Take 40 mg by mouth every morning.  [provider]  potassium chloride SA (K-DUR,KLOR-CON) 20 MEQ tablet Take 20 mEq by mouth daily.     [provider]  Probiotic Product (PROBIOTIC PO) Take 2 tablets by mouth every morning.    [provider]  rOPINIRole (REQUIP) 0.5 MG tablet Take 0.5 mg by mouth at bedtime as needed (for restless legs).     [provider]  sucralfate (CARAFATE) 1 g tablet Take 1 g by mouth 3 (three) times daily with meals as needed (for stomach cramping).    [provider]  traMADol (ULTRAM) 50 MG tablet Take by mouth every 6 (six) hours as needed.    [provider]  valACYclovir (VALTREX) 500 MG tablet Take 500 mg by mouth 2 (two) times daily.    [provider]      PHYSICAL EXAMINATION:   VITAL SIGNS: Blood pressure 120/69, pulse (!) 109, temperature 98.3 F (36.8 C), temperature source Oral, resp. rate 16, SpO2 99 %.  GENERAL:  67 y.o.-year-old patient lying in the bed with no acute  distress.  EYES: Pupils equal, round, reactive to light and accommodation. No scleral icterus. Extraocular muscles intact.  HEENT: Head atraumatic, normocephalic. Oropharynx and nasopharynx clear.  Palatal ulcerations  NECK:  Supple, no jugular venous distention. No thyroid enlargement, no tenderness.  LUNGS: Normal breath sounds bilaterally, no wheezing, rales,rhonchi or crepitation. No use of accessory muscles of respiration.  CARDIOVASCULAR: S1, S2 normal. No murmurs, rubs, or gallops.  ABDOMEN: Soft, nontender, nondistended. Bowel sounds present. No organomegaly or mass.  EXTREMITIES: No pedal edema, cyanosis, or clubbing.  NEUROLOGIC: Cranial nerves II through XII are intact. Muscle strength 5/5 in all extremities. Sensation intact. Gait not checked.  PSYCHIATRIC: The patient is alert and oriented x 3.  SKIN: No obvious rash, lesion, or ulcer.   LABORATORY PANEL:   CBC No results for input(s): WBC, HGB, HCT, PLT, MCV, MCH, MCHC, RDW, LYMPHSABS, MONOABS, EOSABS, BASOSABS, BANDABS in the last 168 hours.  Invalid input(s): NEUTRABS, BANDSABD ------------------------------------------------------------------------------------------------------------------  Chemistries  No results for input(s): NA, K, CL, CO2, GLUCOSE, BUN, CREATININE, CALCIUM, MG, AST, ALT, ALKPHOS, BILITOT in the last 168 hours.  Invalid input(s): GFRCGP ------------------------------------------------------------------------------------------------------------------ CrCl cannot be calculated (Patient's most recent lab result is older than the maximum 21 days allowed.). ------------------------------------------------------------------------------------------------------------------ No results for input(s): TSH, T4TOTAL, T3FREE, THYROIDAB in the last 72 hours.  Invalid input(s): FREET3   Coagulation profile No results for input(s): INR, PROTIME in the last 168  hours. ------------------------------------------------------------------------------------------------------------------- No results for input(s): DDIMER in the last 72 hours. -------------------------------------------------------------------------------------------------------------------  Cardiac Enzymes No results for input(s): CKMB, TROPONINI, MYOGLOBIN in the last 168 hours.  Invalid input(s): CK ------------------------------------------------------------------------------------------------------------------ Invalid input(s): POCBNP  ---------------------------------------------------------------------------------------------------------------  Urinalysis    Component Value Date/Time   COLORURINE AMBER (A) 09/04/2017 1204   APPEARANCEUR HAZY (A) 09/04/2017 1204   APPEARANCEUR Clear 10/19/2013 1359   LABSPEC 1.020 09/04/2017 1204   LABSPEC 1.009 10/19/2013 1359   PHURINE 5.0 09/04/2017 1204   GLUCOSEU NEGATIVE 09/04/2017 1204   GLUCOSEU Negative 10/19/2013 1359   HGBUR NEGATIVE 09/04/2017 [place] 09/04/2017 1204   BILIRUBINUR Negative 10/19/2013 1359   KETONESUR 20 (A) 09/04/2017 1204   PROTEINUR 100 (A) 09/04/2017 1204   NITRITE NEGATIVE 09/04/2017 1204   LEUKOCYTESUR NEGATIVE 09/04/2017 1204   LEUKOCYTESUR Trace 10/19/2013 1359     RADIOLOGY: No results found.  EKG: Orders placed or performed during the hospital encounter of 09/15/16  . EKG 12-Lead  . EKG 12-Lead  .  EKG    IMPRESSION AND PLAN: 1 acute on chronic aphthous ulcerations Most likely secondary to Crohn's disease which are exacerbated by methotrexate Admit to regular nursing for bed, IV fluids for rehydration given inability to tolerate p.o., viscous lidocaine, chlorhexidine mouthwash, ENT consulted for expert opinion, and continue close medical monitoring  2 acute UTI Most recent urine culture was negative Continue home Macrobid for 7-day course  3 chronic GERD without  esophagitis PPI daily  4 chronic allergic rhinitis Stable Continue home regiment  5 chronic Crohn's disease Exacerbated given worsening aphthous ulcerations Plan of care as stated above Methotrexate discontinued  Code  All the records are reviewed and case discussed with ED provider. Management plans discussed with the patient, family and they are in agreement.  CODE STATUS: Code Status History    Date Active Date Inactive Code Status Order ID Comments User Context   09/04/2015 19:57 09/07/2015 18:58 Full Code [id number]  Hillary Bow, MD ED   06/04/2015 11:06 06/04/2015 15:08 Full Code [id number]  Hessie Knows, MD Inpatient   04/14/2015 18:40 04/14/2015 22:45 Full Code [id number]  Hessie Knows, MD Inpatient    Advance Directive Documentation     Most Recent Value  Type of Advance Directive  Healthcare Power of [place], Living will  Pre-existing out of facility DNR order (yellow form or pink MOST form)  No data  "MOST" Form in Place?  No data       TOTAL TIME TAKING CARE OF THIS PATIENT: 45 minutes.    Avel Peace Tracy Gerken M.D on 09/06/2017   Between 7am to 6pm - Pager - [phone]  After 6pm go to [email or web address] - password [email or web address] [facility]  Office  [phone]  CC: Primary care physician; [name], MD   Note: This dictation was prepared with Dragon dictation along with smaller phrase technology. Any transcriptional errors that result from this process are unintentional.

## 2017-09-07 MED ORDER — METHYLPREDNISOLONE SODIUM SUCC 40 MG IJ SOLR
40.0000 mg | Freq: Two times a day (BID) | INTRAMUSCULAR | Status: DC
  Administered 2017-09-07: 40 mg via INTRAVENOUS
  Filled 2017-09-07: qty 1

## 2017-09-07 MED ORDER — LACTATED RINGERS IV SOLN
INTRAVENOUS | Status: DC
  Administered 2017-09-07: 15:00:00 via INTRAVENOUS
  Filled 2017-09-07 (×3): qty 1000

## 2017-09-07 MED ORDER — POTASSIUM CHLORIDE 20 MEQ PO PACK
40.0000 meq | PACK | Freq: Once | ORAL | Status: DC
  Filled 2017-09-07: qty 2

## 2017-09-07 MED ORDER — GELCLAIR MT GEL
1.0000 | Freq: Three times a day (TID) | OROMUCOSAL | Status: DC
  Filled 2017-09-07: qty 1

## 2017-09-07 MED ORDER — PHENOL 1.4 % MT LIQD
1.0000 | OROMUCOSAL | Status: DC | PRN
  Administered 2017-09-07: 1 via OROMUCOSAL
  Filled 2017-09-07: qty 177

## 2017-09-07 MED ORDER — LEUCOVORIN CALCIUM 5 MG PO TABS
5.0000 mg | ORAL_TABLET | Freq: Every day | ORAL | Status: AC
  Administered 2017-09-07 – 2017-09-08 (×2): 5 mg via ORAL
  Filled 2017-09-07 (×2): qty 1

## 2017-09-07 MED ORDER — TEMAZEPAM 15 MG PO CAPS
15.0000 mg | ORAL_CAPSULE | Freq: Every day | ORAL | Status: DC
  Administered 2017-09-07: 15 mg via ORAL
  Filled 2017-09-07: qty 1

## 2017-09-07 MED ORDER — LORATADINE 10 MG PO TABS: 10.0000 mg | ORAL_TABLET | Freq: Every day | ORAL | Status: DC

## 2017-09-07 MED ORDER — SUCRALFATE 1 GM/10ML PO SUSP
2.0000 g | Freq: Three times a day (TID) | ORAL | Status: DC
  Filled 2017-09-07 (×3): qty 20

## 2017-09-07 MED ORDER — DIPHENHYDRAMINE HCL 50 MG/ML IJ SOLN
12.5000 mg | Freq: Four times a day (QID) | INTRAMUSCULAR | Status: DC | PRN
  Administered 2017-09-07: 12.5 mg via INTRAVENOUS
  Filled 2017-09-07: qty 1

## 2017-09-07 MED ORDER — POTASSIUM CHLORIDE CRYS ER 20 MEQ PO TBCR
40.0000 meq | EXTENDED_RELEASE_TABLET | Freq: Once | ORAL | Status: AC
  Administered 2017-09-07: 40 meq via ORAL
  Filled 2017-09-07: qty 2

## 2017-09-07 MED ORDER — LORATADINE 10 MG PO TABS
10.0000 mg | ORAL_TABLET | Freq: Two times a day (BID) | ORAL | Status: DC
  Administered 2017-09-07 – 2017-09-08 (×2): 10 mg via ORAL
  Filled 2017-09-07 (×2): qty 1

## 2017-09-07 MED ORDER — METHYLPREDNISOLONE SODIUM SUCC 125 MG IJ SOLR
60.0000 mg | Freq: Three times a day (TID) | INTRAMUSCULAR | Status: DC
  Administered 2017-09-07 (×2): 60 mg via INTRAVENOUS
  Filled 2017-09-07 (×3): qty 2

## 2017-09-07 MED ORDER — BOOST / RESOURCE BREEZE PO LIQD CUSTOM
1.0000 | Freq: Three times a day (TID) | ORAL | Status: DC
  Administered 2017-09-07: 1 via ORAL

## 2017-09-07 NOTE — Consult Note (Signed)
Reason for Consult: Stomatitis.  Rheumatoid arthritis.  Referring Physician: Hospitalist.  Nicole Bass   HPI: 67 year old white female.  Long history of rheumatoid arthritis and Crohn's disease.  Status post numerous remittive agents.  Some difficulty with biologic agents with prior rash and drug-induced lupus. Long-standing oral ulcers.  Thought mainly to be related to Crohn's.  She has been on injectable methotrexate was very good control of rheumatoid arthritis in the last 6 months.  Recent recurrent worsening ulcers.  She thought they were worse even after holding the methotrexate.  Does take folic acid as well as leucovorin the day of methotrexate.  Admitted with stomatitis, painful.  Difficulty swallowing.  Took some oxycodone and developed generalized rash.  Joints have not been flaring but she has been on IV steroids  PMH: Hypothyroid.  Long-standing steroids.  Crohn's disease.  Rheumatoid arthritis.  Osteoporosis  SURGICAL HISTORY: Bilateral knee fractures  Family History: Negative for rheumatic diseases  Social History: No cigarettes or alcohol  Allergies:  Allergies  Allergen Reactions  . Ciprofloxacin Itching and Rash  . Levofloxacin Hives  . Gabapentin     Other reaction(s): Other (See Comments) Dry mouth  . Sulfa Antibiotics Other (See Comments)    Other reaction(s): Other (See Comments), Unknown Other reaction(s): Other (See Comments), Unknown Per pts MD she is unable to take this medication because it is contraindicated with her other medications. Per pts MD she is unable to take this medication because it is contraindicated with her other medications. Other reaction(s): Other (See Comments), Unknown Other reaction(s): Other (See Comments), Unknown Per pts MD she is unable to take this medication because it is contraindicated with her other medications. Per pts MD she is unable to take this medication because it is contraindicated with her other  medications. Other reaction(s): Other (See Comments), Unknown Per pts MD she is unable to take this medication because it is contraindicated with her other medications. Per pts MD she is unable to take this medication because it is contraindicated with her other medications.    . Cefazolin Itching and Rash  . Cefprozil Rash  . Cephalosporins Itching and Rash  . Enbrel [Etanercept] Itching and Rash  . Humira [Adalimumab] Itching and Rash  . Hydrocodone-Acetaminophen Itching and Rash  . Levaquin [Levofloxacin In D5w] Itching and Rash  . Lorabid [Loracarbef] Itching and Rash  . Meropenem Itching and Rash  . Oxycodone Itching and Rash  . Oxycodone-Acetaminophen Itching and Rash  . Primidone Itching and Rash    Medications:  Scheduled: . aspirin EC  81 mg Oral Daily  . buPROPion  150 mg Oral BID  . calcium-vitamin D  1 tablet Oral QPM  . chlorhexidine  15 mL Mouth/Throat QID  . diphenoxylate-atropine  2 tablet Oral BH-q7a  . docusate sodium  100 mg Oral BID  . enoxaparin (LOVENOX) injection  40 mg Subcutaneous Q24H  . folic acid  1 mg Oral Daily  . furosemide  20 mg Oral BH-q7a  . leucovorin  5 mg Oral Daily  . loratadine  10 mg Oral BID  . magnesium oxide  400 mg Oral Daily  . methylPREDNISolone (SOLU-MEDROL) injection  60 mg Intravenous TID  . multivitamin with minerals  1 tablet Oral Daily  . pantoprazole  40 mg Oral Daily  . potassium chloride  40 mEq Oral Once  . potassium chloride SA  20 mEq Oral Daily  . sucralfate  2 g Oral TID  . temazepam  15 mg Oral QHS  .  valACYclovir  500 mg Oral BID  . vitamin B-12  5,000 mcg Oral Daily        ROS: No recent chest pain.  No recent diarrhea or abdominal pain.  No vaginal ulcers.   PHYSICAL EXAM: Blood pressure (!) 115/48, pulse 93, temperature 98.4 F (36.9 C), temperature source Oral, resp. rate 16, height 5' 1"  (1.549 m), weight 60.5 kg (133 lb 4.8 oz), SpO2 96 %. Pleasant female.  Generalized maculopapular rash  sclera clear.  No significant cervical adenopathy.  Oropharynx with large posterior palate ulcerations.  Clear chest.  Nontender abdomen.  Trace edema. Tenderness right third MCP and mildly across the DIPs.  No significant inflammatory synovitis hands knees or toes.  Shoulders move well.  Assessment: Significant oral ulcers.  Long-standing.  Thought to be chronic related to her Crohn's.  Perhaps worsening with recent methotrexate Rheumatoid arthritis under good control status post numerous agents Recent drug rash   Recommendations: We will add leucovorin 5 mg today and tomorrow. Hold methotrexate for now.  Happy to follow-up in the office to decide further rheumatic agents  Nicole Bass 09/07/2017, 2:36 PM

## 2017-09-07 NOTE — Progress Notes (Addendum)
Initial Nutrition Assessment  DOCUMENTATION CODES:   Not applicable  INTERVENTION:   Recommend monitor K, Mg, and P labs  Boost Breeze po TID, each supplement provides 250 kcal and 9 grams of protein  MVI daily  Folic acid 21m daily  NUTRITION DIAGNOSIS:   Inadequate oral intake related to acute illness as evidenced by other (comment)(pt on clear liquids).  GOAL:   Patient will meet greater than or equal to 90% of their needs  MONITOR:   PO intake, Supplement acceptance, Labs, Weight trends, I & O's, Diet advancement  REASON FOR ASSESSMENT:   Malnutrition Screening Tool    ASSESSMENT:   67y.o. female with a known history per below, chronic aphthous ulcerations for decades, exacerbated by methotrexate use over the last 2-3 years, accepted as a direct admit by ENT given inability to tolerate p.o., patient resting comfortably in bed, family at the bedside, patient is now being admitted for acute on chronic aphthous ulcerations most likely due to Crohn's which is exacerbated by methotrexate use.   Met with pt in room today. Pt reports good appetite pta but reports intermittent poor oral intake at times when her mouth is sore and ulcerated. Pt reports a 30lb weight loss since October. Per chart, pt appears to have lost 34lbs(20%) in one month; RD is unsure if admit weight is correct. RD will request new weight. Pt does not drink any supplements at home. Recommended supplements at times when her mouth is sore and she is unable to eat regularly. Pt reports that she does take a daily MVI. RD also discussed with pt the improtance of monitoring her folate levels as methotrexate depletes this. Pt would like to have Boost Breeze while on a clear liquid diet. Pt prefers Ensure once diet advanced. Pt ate some broth and jello today. Pt likely at refeeding risk; recommend monitor K, Mg, and P labs.   Medications reviewed and include: aspirin, oscal w/ D, colace, lomotil, lovenox, folic acid,  lasix, Mg oxide, MVI, protonix, KCl, carafate, B12, LRS w/ KCl @50ml /hr, morphine  Labs reviewed: K 3.1(L)- 1/23 Wbc- 16.4(H), Hgb 10.3(L), Hct 31.1(L)- 1/23  Nutrition-Focused physical exam completed. Findings are no fat depletion, no muscle depletion, and no edema.   Diet Order:  Diet clear liquid Room service appropriate? Yes; Fluid consistency: Thin  EDUCATION NEEDS:   Education needs have been addressed  Skin:  Reviewed RN Assessment  Last BM:  1/23  Height:   Ht Readings from Last 1 Encounters:  09/06/17 5' 1"  (1.549 m)    Weight:   Wt Readings from Last 1 Encounters:  09/06/17 133 lb 4.8 oz (60.5 kg)    Ideal Body Weight:  47.7 kg  BMI:  Body mass index is 25.19 kg/m.  Estimated Nutritional Needs:   Kcal:  1400-1600kcal/day   Protein:  67-78g/day   Fluid:  >1.5L/day   CKoleen DistanceMS, RD, LDN Pager #-[phone] Hours Pager: [phone]

## 2017-09-07 NOTE — Progress Notes (Signed)
[place] [facility] [place] NAME: Nicole Bass    MR#:  [id number]  DATE OF BIRTH:  [birth year]  SUBJECTIVE:  CHIEF COMPLAINT:  No chief complaint on file. Complains of insomnia, continued throat pain with pain with swallowing, ENT note reviewed  REVIEW OF SYSTEMS:  CONSTITUTIONAL: No fever, fatigue or weakness.  EYES: No blurred or double vision.  EARS, NOSE, AND THROAT: No tinnitus or ear pain.  RESPIRATORY: No cough, shortness of breath, wheezing or hemoptysis.  CARDIOVASCULAR: No chest pain, orthopnea, edema.  GASTROINTESTINAL: No nausea, vomiting, diarrhea or abdominal pain.  GENITOURINARY: No dysuria, hematuria.  ENDOCRINE: No polyuria, nocturia,  HEMATOLOGY: No anemia, easy bruising or bleeding SKIN: No rash or lesion. MUSCULOSKELETAL: No joint pain or arthritis.   NEUROLOGIC: No tingling, numbness, weakness.  PSYCHIATRY: No anxiety or depression.   ROS  DRUG ALLERGIES:   Allergies  Allergen Reactions  . Ciprofloxacin Itching and Rash  . Levofloxacin Hives  . Gabapentin     Other reaction(s): Other (See Comments) Dry mouth  . Sulfa Antibiotics Other (See Comments)    Other reaction(s): Other (See Comments), Unknown Other reaction(s): Other (See Comments), Unknown Per pts MD she is unable to take this medication because it is contraindicated with her other medications. Per pts MD she is unable to take this medication because it is contraindicated with her other medications. Other reaction(s): Other (See Comments), Unknown Other reaction(s): Other (See Comments), Unknown Per pts MD she is unable to take this medication because it is contraindicated with her other medications. Per pts MD she is unable to take this medication because it is contraindicated with her other medications. Other reaction(s): Other (See Comments), Unknown Per pts MD she is unable to take this medication because it is contraindicated with her other  medications. Per pts MD she is unable to take this medication because it is contraindicated with her other medications.    . Cefazolin Itching and Rash  . Cefprozil Rash  . Cephalosporins Itching and Rash  . Enbrel [Etanercept] Itching and Rash  . Humira [Adalimumab] Itching and Rash  . Hydrocodone-Acetaminophen Itching and Rash  . Levaquin [Levofloxacin In D5w] Itching and Rash  . Lorabid [Loracarbef] Itching and Rash  . Meropenem Itching and Rash  . Oxycodone Itching and Rash  . Oxycodone-Acetaminophen Itching and Rash  . Primidone Itching and Rash    VITALS:  Blood pressure (!) 115/48, pulse 93, temperature 98.4 F (36.9 C), temperature source Oral, resp. rate 16, height 5' 1"  (1.549 m), weight 60.5 kg (133 lb 4.8 oz), SpO2 96 %.  PHYSICAL EXAMINATION:  GENERAL:  67 y.o.-year-old patient lying in the bed with no acute distress.  EYES: Pupils equal, round, reactive to light and accommodation. No scleral icterus. Extraocular muscles intact.  HEENT: Head atraumatic, normocephalic.  Stable aphthous ulcerations   NECK:  Supple, no jugular venous distention. No thyroid enlargement, no tenderness.  LUNGS: Normal breath sounds bilaterally, no wheezing, rales,rhonchi or crepitation. No use of accessory muscles of respiration.  CARDIOVASCULAR: S1, S2 normal. No murmurs, rubs, or gallops.  ABDOMEN: Soft, nontender, nondistended. Bowel sounds present. No organomegaly or mass.  EXTREMITIES: No pedal edema, cyanosis, or clubbing.  NEUROLOGIC: Cranial nerves II through XII are intact. Muscle strength 5/5 in all extremities. Sensation intact. Gait not checked.  PSYCHIATRIC: The patient is alert and oriented x 3.  SKIN: No obvious rash, lesion, or ulcer.   Physical Exam LABORATORY PANEL:   CBC Recent Labs  Lab 09/06/17 1742  WBC 16.4*  HGB 10.3*  HCT 31.1*  PLT 298    ------------------------------------------------------------------------------------------------------------------  Chemistries  Recent Labs  Lab 09/06/17 1742  NA 140  K 3.1*  CL 101  CO2 22  GLUCOSE 123*  BUN 21*  CREATININE 1.17*  CALCIUM 9.4  AST 64*  ALT 43  ALKPHOS 124  BILITOT 1.4*   ------------------------------------------------------------------------------------------------------------------  Cardiac Enzymes No results for input(s): TROPONINI in the last 168 hours. ------------------------------------------------------------------------------------------------------------------  RADIOLOGY:  No results found.  ASSESSMENT AND PLAN:  1 acute on chronic aphthous ulcerations Most likely secondary to Crohn's disease, excerbated by methotrexate Continue RNF bed, IVFs for rehydration given inability to tolerate p.o., viscous lidocaine, chlorhexidine mouthwash, ENT input appreciated, rheumatology to see, IV Solu-Medrol with tapering as tolerated, and continue close medical monitoring   2 acute UTI Resolved  Discontinue Macrobid  3 chronic GERD without esophagitis PPI daily  4 chronic allergic rhinitis Stable on home regiment  5 chronic Crohn's disease Exacerbated given worsening aphthous ulcerations Plan of care as stated above Methotrexate discontinued  Full code Condition stable Prognosis good Disposition Home in 1-3 days barring any complications  All the records are reviewed and case discussed with Care Management/Social Workerr. Management plans discussed with the patient, family and they are in agreement.  TOTAL TIME TAKING CARE OF THIS PATIENT: 65 minutes.     POSSIBLE D/C IN 1-3 DAYS, DEPENDING ON CLINICAL CONDITION.   Nicole Bass M.D on 09/07/2017   Between 7am to 6pm - Pager - [phone]  After 6pm go to [email or web address] - password [email or web address] [facility]  Office  [phone]  CC: Primary care physician;  [name], MD  Note: This dictation was prepared with Dragon dictation along with smaller phrase technology. Any transcriptional errors that result from this process are unintentional.

## 2017-09-07 NOTE — Progress Notes (Addendum)
Per Dr. Clyda Hurdle place rhematology consult. Also per MD okay to change potassium from liquid to oral tablet at same dose.

## 2017-09-07 NOTE — Progress Notes (Signed)
Per Dr. Pryor Ochoa okay to order chloreseptic spray per pt request, no contraindications with sores.

## 2017-09-07 NOTE — Progress Notes (Signed)
.. 09/07/2017 8:24 AM  Nicole Bass [id number]  Hospital Day 2    Temp:  [98 F (36.7 C)-98.3 F (36.8 C)] 98.1 F (36.7 C) (01/24 0527) Pulse Rate:  [74-109] 74 (01/24 0527) Resp:  [16] 16 (01/24 0527) BP: (119-127)/(53-69) 119/53 (01/24 0527) SpO2:  [96 %-99 %] 96 % (01/24 0527) Weight:  [60.5 kg (133 lb 4.8 oz)] 60.5 kg (133 lb 4.8 oz) (01/23 1621),     Intake/Output Summary (Last 24 hours) at 09/07/2017 0824 Last data filed at 09/07/2017 6808 Gross per 24 hour  Intake 887 ml  Output 400 ml  Net 487 ml    Results for orders placed or performed during the hospital encounter of 09/06/17 (from the past 24 hour(s))  Comprehensive metabolic panel     Status: Abnormal   Collection Time: 09/06/17  5:42 PM  Result Value Ref Range   Sodium 140 135 - 145 mmol/L   Potassium 3.1 (L) 3.5 - 5.1 mmol/L   Chloride 101 101 - 111 mmol/L   CO2 22 22 - 32 mmol/L   Glucose, Bld 123 (H) 65 - 99 mg/dL   BUN 21 (H) 6 - 20 mg/dL   Creatinine, Ser 1.17 (H) 0.44 - 1.00 mg/dL   Calcium 9.4 8.9 - 10.3 mg/dL   Total Protein 7.5 6.5 - 8.1 g/dL   Albumin 3.5 3.5 - 5.0 g/dL   AST 64 (H) 15 - 41 U/L   ALT 43 14 - 54 U/L   Alkaline Phosphatase 124 38 - 126 U/L   Total Bilirubin 1.4 (H) 0.3 - 1.2 mg/dL   GFR calc non Af Amer 48 (L) >60 mL/min   GFR calc Af Amer 56 (L) >60 mL/min   Anion gap 17 (H) 5 - 15  CBC WITH DIFFERENTIAL     Status: Abnormal   Collection Time: 09/06/17  5:42 PM  Result Value Ref Range   WBC 16.4 (H) 3.6 - 11.0 K/uL   RBC 3.36 (L) 3.80 - 5.20 MIL/uL   Hemoglobin 10.3 (L) 12.0 - 16.0 g/dL   HCT 31.1 (L) 35.0 - 47.0 %   MCV 92.4 80.0 - 100.0 fL   MCH 30.7 26.0 - 34.0 pg   MCHC 33.2 32.0 - 36.0 g/dL   RDW 16.5 (H) 11.5 - 14.5 %   Platelets 298 150 - 440 K/uL   Neutrophils Relative % 95 %   Neutro Abs 15.6 (H) 1.4 - 6.5 K/uL   Lymphocytes Relative 2 %   Lymphs Abs 0.4 (L) 1.0 - 3.6 K/uL   Monocytes Relative 3 %   Monocytes Absolute 0.4 0.2 - 0.9 K/uL   Eosinophils Relative 0 %   Eosinophils Absolute 0.0 0 - 0.7 K/uL   Basophils Relative 0 %   Basophils Absolute 0.0 0 - 0.1 K/uL    SUBJECTIVE:  PAtient continues to report significant pain.  Tolerating some liquid.  Started on Tramadol.  Reports this helps some but not enough.  Continues to have rash from hydrocodone yesterday.  Reports did not sleep well due to pain.  OBJECTIVE:  GEN-  Sitting upright in chair OC/OP-  Continues to have xerostomia and ulcerations in posterior oropharynx and soft palate.  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  Will add Gel-clair to see if helps with chemotherapy induced stomatitis.  Recommend addition of  steroids as patient is on steroids for maintenance of RA/Chron's and was previously on taper prior to admission.  Defer hyponatremia treatment to medicine.  Kaylani Fromme 09/07/2017, 8:24 AM

## 2017-09-08 DIAGNOSIS — K120 Recurrent oral aphthae: Secondary | ICD-10-CM | POA: Diagnosis not present

## 2017-09-08 LAB — HIV ANTIBODY (ROUTINE TESTING W REFLEX): HIV Screen 4th Generation wRfx: NONREACTIVE

## 2017-09-08 LAB — MAGNESIUM: Magnesium: 1.8 mg/dL (ref 1.7–2.4)

## 2017-09-08 LAB — POTASSIUM: Potassium: 4.1 mmol/L (ref 3.5–5.1)

## 2017-09-08 MED ORDER — PHENOL 1.4 % MT LIQD: 1.0000 | OROMUCOSAL | 0 refills | Status: DC | PRN

## 2017-09-08 MED ORDER — ENSURE ENLIVE PO LIQD: 237.0000 mL | Freq: Two times a day (BID) | ORAL | Status: DC

## 2017-09-08 MED ORDER — CHLORHEXIDINE GLUCONATE 0.12 % MT SOLN: 15.0000 mL | Freq: Four times a day (QID) | OROMUCOSAL | 0 refills | Status: DC

## 2017-09-08 MED ORDER — PREDNISONE 50 MG PO TABS: ORAL_TABLET | ORAL | 0 refills | Status: DC

## 2017-09-08 MED ORDER — LIDOCAINE VISCOUS 2 % MT SOLN: 15.0000 mL | OROMUCOSAL | 0 refills | Status: DC | PRN

## 2017-09-08 MED ORDER — LEUCOVORIN CALCIUM 5 MG PO TABS: 5.0000 mg | ORAL_TABLET | Freq: Every day | ORAL | 0 refills | Status: DC

## 2017-09-08 NOTE — Discharge Planning (Signed)
[place] [facility] [place] NAME: Nicole Bass    MR#:  [id number]  DATE OF BIRTH:  11-Mar-1951  DATE OF ADMISSION:  09/06/2017 ADMITTING PHYSICIAN: Bettey Costa, MD  DATE OF DISCHARGE: No discharge date for patient encounter.  PRIMARY CARE PHYSICIAN: [name], MD    ADMISSION DIAGNOSIS:  stomatitis  DISCHARGE DIAGNOSIS:  Active Problems:   Stomatitis, ulcerative   SECONDARY DIAGNOSIS:   Past Medical History:  Diagnosis Date  . Anemia   . Arthritis   . Collagen vascular disease ([place])   . Crohn's disease ([place])   . DDD (degenerative disc disease), cervical   . DDD (degenerative disc disease), cervical   . DDD (degenerative disc disease), cervical 2003  . DDD (degenerative disc disease), lumbar   . Degenerative disc disease, cervical   . Difficult intubation     HOSPITAL COURSE:  1acute on chronic aphthous ulcerations Most likely secondary to Crohn's disease, excerbated by methotrexate Admitted to the regular nursing floor, provided IVFs for rehydration given inability to tolerate p.o., viscous lidocaine, chlorhexidine mouthwash, ENT did see patient while in house-no invasive treatment recommended/outpatient follow-up in 2-3 weeks, rheumatology/Dr. Jefm Bryant did see patient while in house-treated with leucovorin for 2-day course, received IV Solu-Medrol for 24-hour course, changed to prednisone on day of discharge   2acute UTI Resolved  Discontinued Macrobid  3chronic GERD without esophagitis PPI daily  4chronic allergic rhinitis Stable on home regiment  5chronic Crohn's disease Exacerbated given worsening aphthous ulcerations Plan of care as stated above Methotrexate discontinued Follow-up with rheumatology status post discharge for continued care/management    DISCHARGE CONDITIONS:  On day of discharge patient is afebrile, heme dynamically stable, tolerating diet, ready for discharge home with  appropriate follow-up with primary care provider ENTs to the  CONSULTS OBTAINED:  Treatment Team:  Salary, Avel Peace, MD [name]., MD  DRUG ALLERGIES:   Allergies  Allergen Reactions  . Ciprofloxacin Itching and Rash  . Levofloxacin Hives  . Gabapentin     Other reaction(s): Other (See Comments) Dry mouth  . Sulfa Antibiotics Other (See Comments)    Other reaction(s): Other (See Comments), Unknown Other reaction(s): Other (See Comments), Unknown Per pts MD she is unable to take this medication because it is contraindicated with her other medications. Per pts MD she is unable to take this medication because it is contraindicated with her other medications. Other reaction(s): Other (See Comments), Unknown Other reaction(s): Other (See Comments), Unknown Per pts MD she is unable to take this medication because it is contraindicated with her other medications. Per pts MD she is unable to take this medication because it is contraindicated with her other medications. Other reaction(s): Other (See Comments), Unknown Per pts MD she is unable to take this medication because it is contraindicated with her other medications. Per pts MD she is unable to take this medication because it is contraindicated with her other medications.    . Cefazolin Itching and Rash  . Cefprozil Rash  . Cephalosporins Itching and Rash  . Enbrel [Etanercept] Itching and Rash  . Humira [Adalimumab] Itching and Rash  . Hydrocodone-Acetaminophen Itching and Rash  . Levaquin [Levofloxacin In D5w] Itching and Rash  . Lorabid [Loracarbef] Itching and Rash  . Meropenem Itching and Rash  . Oxycodone Itching and Rash  . Oxycodone-Acetaminophen Itching and Rash  . Primidone Itching and Rash    DISCHARGE MEDICATIONS:   Allergies as of 09/08/2017  Reactions   Ciprofloxacin Itching, Rash   Levofloxacin Hives   Gabapentin    Other reaction(s): Other (See Comments) Dry mouth   Sulfa  Antibiotics Other (See Comments)   Other reaction(s): Other (See Comments), Unknown Other reaction(s): Other (See Comments), Unknown Per pts MD she is unable to take this medication because it is contraindicated with her other medications. Per pts MD she is unable to take this medication because it is contraindicated with her other medications. Other reaction(s): Other (See Comments), Unknown Other reaction(s): Other (See Comments), Unknown Per pts MD she is unable to take this medication because it is contraindicated with her other medications. Per pts MD she is unable to take this medication because it is contraindicated with her other medications. Other reaction(s): Other (See Comments), Unknown Per pts MD she is unable to take this medication because it is contraindicated with her other medications. Per pts MD she is unable to take this medication because it is contraindicated with her other medications.     Cefazolin Itching, Rash   Cefprozil Rash   Cephalosporins Itching, Rash   Enbrel [etanercept] Itching, Rash   Humira [adalimumab] Itching, Rash   Hydrocodone-acetaminophen Itching, Rash   Levaquin [levofloxacin In D5w] Itching, Rash   Lorabid [loracarbef] Itching, Rash   Meropenem Itching, Rash   Oxycodone Itching, Rash   Oxycodone-acetaminophen Itching, Rash   Primidone Itching, Rash      Medication List    STOP taking these medications   METHOTREXATE SODIUM IJ   nitrofurantoin (macrocrystal-monohydrate) 100 MG capsule Commonly known as:  MACROBID     TAKE these medications   aspirin EC 81 MG tablet Take 81 mg by mouth daily.   buPROPion 150 MG 12 hr tablet Commonly known as:  WELLBUTRIN SR Take 150 mg by mouth 2 (two) times daily.   cetirizine 10 MG tablet Commonly known as:  ZYRTEC Take 10 mg by mouth daily.   chlorhexidine 0.12 % solution Commonly known as:  PERIDEX Use as directed 15 mLs in the mouth or throat 4 (four) times daily.    conjugated estrogens vaginal cream Commonly known as:  PREMARIN Place 3.09 Applicatorfuls vaginally 2 (two) times a week.   cyanocobalamin 1000 MCG/ML injection Commonly known as:  (VITAMIN B-12) Inject 1,000 mcg into the muscle every 30 (thirty) days.   Vitamin B-12 6000 MCG Subl Place 6,000 mcg under the tongue daily.   diphenoxylate-atropine 2.5-0.025 MG tablet Commonly known as:  LOMOTIL Take 2 tablets by mouth every morning.   folic acid 1 MG tablet Commonly known as:  FOLVITE Take 1 mg by mouth daily.   furosemide 20 MG tablet Commonly known as:  LASIX Take 20 mg by mouth every morning.   leucovorin 5 MG tablet Commonly known as:  WELLCOVORIN Take 1 tablet (5 mg total) by mouth daily.   lidocaine 2 % solution Commonly known as:  XYLOCAINE Use as directed 15 mLs in the mouth or throat every 4 (four) hours as needed for mouth pain.   magnesium oxide 400 MG tablet Commonly known as:  MAG-OX Take 400 mg by mouth daily.   multivitamin with minerals tablet Take 1 tablet by mouth daily.   omeprazole 40 MG capsule Commonly known as:  PRILOSEC Take 40 mg by mouth every morning.   phenol 1.4 % Liqd Commonly known as:  CHLORASEPTIC Use as directed 1 spray in the mouth or throat as needed for throat irritation / pain.   potassium chloride SA 20 MEQ tablet Commonly  known as:  K-DUR,KLOR-CON Take 20 mEq by mouth daily.   predniSONE 50 MG tablet Commonly known as:  DELTASONE 1 daily   PROBIOTIC PO Take 2 tablets by mouth every morning.   rOPINIRole 0.5 MG tablet Commonly known as:  REQUIP Take 0.5 mg by mouth at bedtime as needed (for restless legs).   sucralfate 1 g tablet Commonly known as:  CARAFATE Take 1 g by mouth 3 (three) times daily with meals as needed (for stomach cramping).   traMADol 50 MG tablet Commonly known as:  ULTRAM Take by mouth every 6 (six) hours as needed.   valACYclovir 500 MG tablet Commonly known as:  VALTREX Take 500 mg by  mouth 2 (two) times daily.   VIACTIV PO Take 2 Doses by mouth every evening.        DISCHARGE INSTRUCTIONS:   If you experience worsening of your admission symptoms, develop shortness of breath, life threatening emergency, suicidal or homicidal thoughts you must seek medical attention immediately by calling 911 or calling your MD immediately  if symptoms less severe.  You Must read complete instructions/literature along with all the possible adverse reactions/side effects for all the Medicines you take and that have been prescribed to you. Take any new Medicines after you have completely understood and accept all the possible adverse reactions/side effects.   Please note  You were cared for by a hospitalist during your hospital stay. If you have any questions about your discharge medications or the care you received while you were in the hospital after you are discharged, you can call the unit and asked to speak with the hospitalist on call if the hospitalist that took care of you is not available. Once you are discharged, your primary care physician will handle any further medical issues. Please note that NO REFILLS for any discharge medications will be authorized once you are discharged, as it is imperative that you return to your primary care physician (or establish a relationship with a primary care physician if you do not have one) for your aftercare needs so that they can reassess your need for medications and monitor your lab values.   Today  Chief complaint-sore throat/mouth ulcers   HISTORY OF PRESENT ILLNESS:  67 y.o. female with a known history per below, chronic aphthous ulcerations for decades, exacerbated by methotrexate use over the last 2-3 years, accepted as a direct admit by ENT given inability to tolerate p.o., patient resting comfortably in bed, family at the bedside, patient is now being admitted for acute on chronic aphthous ulcerations most likely due to Crohn's which  is exacerbated by methotrexate use.  VITAL SIGNS:  Blood pressure 133/65, pulse 65, temperature 98.2 F (36.8 C), temperature source Oral, resp. rate 18, height 5' 1"  (1.549 m), weight 60.5 kg (133 lb 4.8 oz), SpO2 96 %.  I/O:    Intake/Output Summary (Last 24 hours) at 09/08/2017 0958 Last data filed at 09/08/2017 0725 Gross per 24 hour  Intake 2687 ml  Output 750 ml  Net 1937 ml    PHYSICAL EXAMINATION:  GENERAL:  67 y.o.-year-old patient lying in the bed with no acute distress.  EYES: Pupils equal, round, reactive to light and accommodation. No scleral icterus. Extraocular muscles intact.  HEENT: Head atraumatic, normocephalic. Oropharynx and nasopharynx clear.  NECK:  Supple, no jugular venous distention. No thyroid enlargement, no tenderness.  LUNGS: Normal breath sounds bilaterally, no wheezing, rales,rhonchi or crepitation. No use of accessory muscles of respiration.  CARDIOVASCULAR: S1, S2  normal. No murmurs, rubs, or gallops.  ABDOMEN: Soft, non-tender, non-distended. Bowel sounds present. No organomegaly or mass.  EXTREMITIES: No pedal edema, cyanosis, or clubbing.  NEUROLOGIC: Cranial nerves II through XII are intact. Muscle strength 5/5 in all extremities. Sensation intact. Gait not checked.  PSYCHIATRIC: The patient is alert and oriented x 3.  SKIN: No obvious rash, lesion, or ulcer.   DATA REVIEW:   CBC Recent Labs  Lab 09/06/17 1742  WBC 16.4*  HGB 10.3*  HCT 31.1*  PLT 298    Chemistries  Recent Labs  Lab 09/06/17 1742 09/08/17 0339  NA 140  --   K 3.1* 4.1  CL 101  --   CO2 22  --   GLUCOSE 123*  --   BUN 21*  --   CREATININE 1.17*  --   CALCIUM 9.4  --   MG  --  1.8  AST 64*  --   ALT 43  --   ALKPHOS 124  --   BILITOT 1.4*  --     Cardiac Enzymes No results for input(s): TROPONINI in the last 168 hours.  Microbiology Results  Results for orders placed or performed during the hospital encounter of 05/03/17  Rapid strep screen      Status: None   Collection Time: 05/03/17  7:12 PM  Result Value Ref Range Status   Streptococcus, Group A Screen (Direct) NEGATIVE NEGATIVE Final    Comment: (NOTE) A Rapid Antigen test may result negative if the antigen level in the sample is below the detection level of this test. The FDA has not cleared this test as a stand-alone test therefore the rapid antigen negative result has reflexed to a Group A Strep culture.   Culture, group A strep     Status: None   Collection Time: 05/03/17  7:12 PM  Result Value Ref Range Status   Specimen Description THROAT  Final   Special Requests NONE Reflexed from [id number]  Final   Culture   Final    NO GROUP A STREP (S.PYOGENES) ISOLATED Performed at [facility], [address] [place], [place] [address]    Report Status 05/06/2017 FINAL  Final    RADIOLOGY:  No results found.  EKG:   Orders placed or performed during the hospital encounter of 09/15/16  . EKG 12-Lead  . EKG 12-Lead  . EKG      Management plans discussed with the patient, family and they are in agreement.  CODE STATUS:     Code Status Orders  (From admission, onward)        Start     Ordered   09/06/17 1720  Full code  Continuous     09/06/17 1719    Code Status History    Date Active Date Inactive Code Status Order ID Comments User Context   09/04/2015 19:57 09/07/2015 18:58 Full Code [id number]  Hillary Bow, MD ED   06/04/2015 11:06 06/04/2015 15:08 Full Code [id number]  Hessie Knows, MD Inpatient   04/14/2015 18:40 04/14/2015 22:45 Full Code [id number]  Hessie Knows, MD Inpatient    Advance Directive Documentation     Most Recent Value  Type of Advance Directive  Healthcare Power of Attorney, Living will  Pre-existing out of facility DNR order (yellow form or pink MOST form)  No data  "MOST" Form in Place?  No data      TOTAL TIME TAKING CARE OF THIS PATIENT: 45 minutes.    Montell D Salary M.D  on 09/08/2017 at 9:58 AM  Between 7am to 6pm  - Pager - [phone]  After 6pm go to [email or web address] - password [email or web address] [facility]  Office  [phone]  CC: Primary care physician; [name], MD   Note: This dictation was prepared with Dragon dictation along with smaller phrase technology. Any transcriptional errors that result from this process are unintentional.

## 2017-09-08 NOTE — Care Management Important Message (Signed)
Important Message  Patient Details  Name: Nicole Bass MRN: [id number] Date of Birth: 23-Apr-1951   Medicare Important Message Given:  N/A - LOS <3 / Initial given by admissions    Beverly Sessions, RN 09/08/2017, 10:38 AM

## 2017-09-08 NOTE — Progress Notes (Signed)
Nicole Bass  A and O x 4. VSS. Pt tolerating diet well. No complaints of pain or nausea. IV removed intact, prescriptions given. Pt voiced understanding of discharge instructions with no further questions. Pt discharged via wheelchair with axillary.  Lynann Bologna MSN, RN-BC  Allergies as of 09/08/2017      Reactions   Ciprofloxacin Itching, Rash   Levofloxacin Hives   Gabapentin    Other reaction(s): Other (See Comments) Dry mouth   Sulfa Antibiotics Other (See Comments)   Other reaction(s): Other (See Comments), Unknown Other reaction(s): Other (See Comments), Unknown Per pts MD she is unable to take this medication because it is contraindicated with her other medications. Per pts MD she is unable to take this medication because it is contraindicated with her other medications. Other reaction(s): Other (See Comments), Unknown Other reaction(s): Other (See Comments), Unknown Per pts MD she is unable to take this medication because it is contraindicated with her other medications. Per pts MD she is unable to take this medication because it is contraindicated with her other medications. Other reaction(s): Other (See Comments), Unknown Per pts MD she is unable to take this medication because it is contraindicated with her other medications. Per pts MD she is unable to take this medication because it is contraindicated with her other medications.     Cefazolin Itching, Rash   Cefprozil Rash   Cephalosporins Itching, Rash   Enbrel [etanercept] Itching, Rash   Humira [adalimumab] Itching, Rash   Hydrocodone-acetaminophen Itching, Rash   Levaquin [levofloxacin In D5w] Itching, Rash   Lorabid [loracarbef] Itching, Rash   Meropenem Itching, Rash   Oxycodone Itching, Rash   Oxycodone-acetaminophen Itching, Rash   Primidone Itching, Rash      Medication List    STOP taking these medications   METHOTREXATE SODIUM IJ   nitrofurantoin (macrocrystal-monohydrate) 100 MG  capsule Commonly known as:  MACROBID     TAKE these medications   aspirin EC 81 MG tablet Take 81 mg by mouth daily.   buPROPion 150 MG 12 hr tablet Commonly known as:  WELLBUTRIN SR Take 150 mg by mouth 2 (two) times daily.   cetirizine 10 MG tablet Commonly known as:  ZYRTEC Take 10 mg by mouth daily.   chlorhexidine 0.12 % solution Commonly known as:  PERIDEX Use as directed 15 mLs in the mouth or throat 4 (four) times daily.   conjugated estrogens vaginal cream Commonly known as:  PREMARIN Place 2.35 Applicatorfuls vaginally 2 (two) times a week.   cyanocobalamin 1000 MCG/ML injection Commonly known as:  (VITAMIN B-12) Inject 1,000 mcg into the muscle every 30 (thirty) days.   Vitamin B-12 6000 MCG Subl Place 6,000 mcg under the tongue daily.   diphenoxylate-atropine 2.5-0.025 MG tablet Commonly known as:  LOMOTIL Take 2 tablets by mouth every morning.   folic acid 1 MG tablet Commonly known as:  FOLVITE Take 1 mg by mouth daily.   furosemide 20 MG tablet Commonly known as:  LASIX Take 20 mg by mouth every morning.   leucovorin 5 MG tablet Commonly known as:  WELLCOVORIN Take 1 tablet (5 mg total) by mouth daily.   lidocaine 2 % solution Commonly known as:  XYLOCAINE Use as directed 15 mLs in the mouth or throat every 4 (four) hours as needed for mouth pain.   magnesium oxide 400 MG tablet Commonly known as:  MAG-OX Take 400 mg by mouth daily.   multivitamin with minerals tablet Take 1 tablet by mouth daily.  omeprazole 40 MG capsule Commonly known as:  PRILOSEC Take 40 mg by mouth every morning.   phenol 1.4 % Liqd Commonly known as:  CHLORASEPTIC Use as directed 1 spray in the mouth or throat as needed for throat irritation / pain.   potassium chloride SA 20 MEQ tablet Commonly known as:  K-DUR,KLOR-CON Take 20 mEq by mouth daily.   predniSONE 50 MG tablet Commonly known as:  DELTASONE 1 daily   PROBIOTIC PO Take 2 tablets by mouth  every morning.   rOPINIRole 0.5 MG tablet Commonly known as:  REQUIP Take 0.5 mg by mouth at bedtime as needed (for restless legs).   sucralfate 1 g tablet Commonly known as:  CARAFATE Take 1 g by mouth 3 (three) times daily with meals as needed (for stomach cramping).   traMADol 50 MG tablet Commonly known as:  ULTRAM Take by mouth every 6 (six) hours as needed.   valACYclovir 500 MG tablet Commonly known as:  VALTREX Take 500 mg by mouth 2 (two) times daily.   VIACTIV PO Take 2 Doses by mouth every evening.       Vitals:   09/08/17 0543 09/08/17 1004  BP: 133/65 (!) 116/46  Pulse: 65   Resp: 18   Temp: 98.2 F (36.8 C)   SpO2: 96%

## 2017-09-08 NOTE — Progress Notes (Signed)
Per Dr. Clyda Hurdle okay to place patient on soft diet

## 2017-09-08 NOTE — Progress Notes (Signed)
..   09/08/2017 8:12 AM  Nicole Bass [id number]  Hopsital Day 2    Temp:  [98.2 F (36.8 C)-99.1 F (37.3 C)] 98.2 F (36.8 C) (01/25 0543) Pulse Rate:  [65-96] 65 (01/25 0543) Resp:  [16-18] 18 (01/25 0543) BP: (113-133)/(48-65) 133/65 (01/25 0543) SpO2:  [95 %-96 %] 96 % (01/25 0543),     Intake/Output Summary (Last 24 hours) at 09/08/2017 9179 Last data filed at 09/08/2017 0725 Gross per 24 hour  Intake 2687 ml  Output 975 ml  Net 1712 ml    Results for orders placed or performed during the hospital encounter of 09/06/17 (from the past 24 hour(s))  Magnesium     Status: None   Collection Time: 09/08/17  3:39 AM  Result Value Ref Range   Magnesium 1.8 1.7 - 2.4 mg/dL  Potassium     Status: None   Collection Time: 09/08/17  3:39 AM  Result Value Ref Range   Potassium 4.1 3.5 - 5.1 mmol/L    SUBJECTIVE:  No acute events.  Pain improved.  K and Mag improved.  Tolerating PO.  OBJECTIVE:  GEN-  NAD, upright in bed OC/OP-  Continues to have ulcerations of posterior palate and buccal mucosa, improved with less erythema  IMPRESSION:  Ulcerative stomatitis  PLAN:  OK to discharge per ENT perspective given improved pain control.  Recommend continuation of sucralfate solution, vicks chloroseptic, and viscous lidocaine.  Patient tolerating tramadol as well.  Defer valcyclvir to medicine.  Follow up with me in 2 to 3 weeks.  Nicole Bass 09/08/2017, 8:12 AM

## 2017-09-09 NOTE — Discharge Summary (Signed)
[place] [facility] [place] NAME: Nicole Bass  MR#: [id number]  DATE OF BIRTH: 29-Oct-1950  DATE OF ADMISSION: 09/06/2017 ADMITTING PHYSICIAN: Bettey Costa, MD  DATE OF DISCHARGE: No discharge date for patient encounter.  PRIMARY CARE PHYSICIAN: [name], MD  ADMISSION DIAGNOSIS:  stomatitis  DISCHARGE DIAGNOSIS:  Active Problems:  Stomatitis, ulcerative   SECONDARY DIAGNOSIS:       Past Medical History:  Diagnosis Date  . Anemia   . Arthritis   . Collagen vascular disease ([place])   . Crohn's disease ([place])   . DDD (degenerative disc disease), cervical   . DDD (degenerative disc disease), cervical   . DDD (degenerative disc disease), cervical 2003  . DDD (degenerative disc disease), lumbar   . Degenerative disc disease, cervical   . Difficult intubation    HOSPITAL COURSE:  1 acute on chronic aphthous ulcerations  Most likely secondary to Crohn's disease, excerbated by methotrexate  Admitted to the regular nursing floor, provided IVFs for rehydration given inability to tolerate p.o., viscous lidocaine, chlorhexidine mouthwash, ENT did see patient while in house-no invasive treatment recommended/outpatient follow-up in 2-3 weeks, rheumatology/Dr. Jefm Bryant did see patient while in house-treated with leucovorin for 2-day course, received IV Solu-Medrol for 24-hour course, changed to prednisone on day of discharge  2 acute UTI  Resolved  Discontinued Macrobid  3 chronic GERD without esophagitis  PPI daily  4 chronic allergic rhinitis  Stable on home regiment  5 chronic Crohn's disease  Exacerbated given worsening aphthous ulcerations  Plan of care as stated above  Methotrexate discontinued  Follow-up with rheumatology status post discharge for continued care/management  DISCHARGE CONDITIONS:  On day of discharge patient is afebrile, heme dynamically stable, tolerating diet, ready for discharge home with appropriate follow-up with  primary care provider ENTs to the  CONSULTS OBTAINED:  Treatment Team:  SalaryAvel Peace, MD  [name]., MD  DRUG ALLERGIES:        Allergies  Allergen Reactions  . Ciprofloxacin Itching and Rash  . Levofloxacin Hives  . Gabapentin     Other reaction(s): Other (See Comments)  Dry mouth  . Sulfa Antibiotics Other (See Comments)    Other reaction(s): Other (See Comments), Unknown  Other reaction(s): Other (See Comments), Unknown  Per pts MD she is unable to take this medication because it is contraindicated with her other medications.  Per pts MD she is unable to take this medication because it is contraindicated with her other medications.  Other reaction(s): Other (See Comments), Unknown  Other reaction(s): Other (See Comments), Unknown  Per pts MD she is unable to take this medication because it is contraindicated with her other medications.  Per pts MD she is unable to take this medication because it is contraindicated with her other medications.  Other reaction(s): Other (See Comments), Unknown  Per pts MD she is unable to take this medication because it is contraindicated with her other medications.  Per pts MD she is unable to take this medication because it is contraindicated with her other medications.   . Cefazolin Itching and Rash  . Cefprozil Rash  . Cephalosporins Itching and Rash  . Enbrel [Etanercept] Itching and Rash  . Humira [Adalimumab] Itching and Rash  . Hydrocodone-Acetaminophen Itching and Rash  . Levaquin [Levofloxacin In D5w] Itching and Rash  . Lorabid [Loracarbef] Itching and Rash  . Meropenem Itching and Rash  . Oxycodone Itching and Rash  . Oxycodone-Acetaminophen Itching and  Rash  . Primidone Itching and Rash   DISCHARGE MEDICATIONS:       Allergies as of 09/08/2017      Reactions   Ciprofloxacin Itching, Rash   Levofloxacin Hives   Gabapentin    Other reaction(s): Other (See Comments)  Dry mouth   Sulfa Antibiotics Other  (See Comments)   Other reaction(s): Other (See Comments), Unknown  Other reaction(s): Other (See Comments), Unknown  Per pts MD she is unable to take this medication because it is contraindicated with her other medications.  Per pts MD she is unable to take this medication because it is contraindicated with her other medications.  Other reaction(s): Other (See Comments), Unknown  Other reaction(s): Other (See Comments), Unknown  Per pts MD she is unable to take this medication because it is contraindicated with her other medications.  Per pts MD she is unable to take this medication because it is contraindicated with her other medications.  Other reaction(s): Other (See Comments), Unknown  Per pts MD she is unable to take this medication because it is contraindicated with her other medications.  Per pts MD she is unable to take this medication because it is contraindicated with her other medications.    Cefazolin Itching, Rash   Cefprozil Rash   Cephalosporins Itching, Rash   Enbrel [etanercept] Itching, Rash   Humira [adalimumab] Itching, Rash   Hydrocodone-acetaminophen Itching, Rash   Levaquin [levofloxacin In D5w] Itching, Rash   Lorabid [loracarbef] Itching, Rash   Meropenem Itching, Rash   Oxycodone Itching, Rash   Oxycodone-acetaminophen Itching, Rash   Primidone Itching, Rash                Medication List      STOP taking these medications    METHOTREXATE SODIUM IJ   nitrofurantoin (macrocrystal-monohydrate) 100 MG capsule  Commonly known as: MACROBID       TAKE these medications    aspirin EC 81 MG tablet  Take 81 mg by mouth daily.   buPROPion 150 MG 12 hr tablet  Commonly known as: WELLBUTRIN SR  Take 150 mg by mouth 2 (two) times daily.   cetirizine 10 MG tablet  Commonly known as: ZYRTEC  Take 10 mg by mouth daily.   chlorhexidine 0.12 % solution  Commonly known as: PERIDEX  Use as directed 15 mLs in the mouth or throat 4 (four) times daily.    conjugated estrogens vaginal cream  Commonly known as: PREMARIN  Place 8.00 Applicatorfuls vaginally 2 (two) times a week.   cyanocobalamin 1000 MCG/ML injection  Commonly known as: (VITAMIN B-12)  Inject 1,000 mcg into the muscle every 30 (thirty) days.   Vitamin B-12 6000 MCG Subl  Place 6,000 mcg under the tongue daily.   diphenoxylate-atropine 2.5-0.025 MG tablet  Commonly known as: LOMOTIL  Take 2 tablets by mouth every morning.   folic acid 1 MG tablet  Commonly known as: FOLVITE  Take 1 mg by mouth daily.   furosemide 20 MG tablet  Commonly known as: LASIX  Take 20 mg by mouth every morning.   leucovorin 5 MG tablet  Commonly known as: WELLCOVORIN  Take 1 tablet (5 mg total) by mouth daily.   lidocaine 2 % solution  Commonly known as: XYLOCAINE  Use as directed 15 mLs in the mouth or throat every 4 (four) hours as needed for mouth pain.   magnesium oxide 400 MG tablet  Commonly known as: MAG-OX  Take 400 mg by mouth daily.  multivitamin with minerals tablet  Take 1 tablet by mouth daily.   omeprazole 40 MG capsule  Commonly known as: PRILOSEC  Take 40 mg by mouth every morning.   phenol 1.4 % Liqd  Commonly known as: CHLORASEPTIC  Use as directed 1 spray in the mouth or throat as needed for throat irritation / pain.   potassium chloride SA 20 MEQ tablet  Commonly known as: K-DUR,KLOR-CON  Take 20 mEq by mouth daily.   predniSONE 50 MG tablet  Commonly known as: DELTASONE  1 daily   PROBIOTIC PO  Take 2 tablets by mouth every morning.   rOPINIRole 0.5 MG tablet  Commonly known as: REQUIP  Take 0.5 mg by mouth at bedtime as needed (for restless legs).   sucralfate 1 g tablet  Commonly known as: CARAFATE  Take 1 g by mouth 3 (three) times daily with meals as needed (for stomach cramping).   traMADol 50 MG tablet  Commonly known as: ULTRAM  Take by mouth every 6 (six) hours as needed.   valACYclovir 500 MG tablet  Commonly known as: VALTREX  Take 500 mg by  mouth 2 (two) times daily.   VIACTIV PO  Take 2 Doses by mouth every evening.       DISCHARGE INSTRUCTIONS:  If you experience worsening of your admission symptoms, develop shortness of breath, life threatening emergency, suicidal or homicidal thoughts you must seek medical attention immediately by calling 911 or calling your MD immediately if symptoms less severe.  You Must read complete instructions/literature along with all the possible adverse reactions/side effects for all the Medicines you take and that have been prescribed to you. Take any new Medicines after you have completely understood and accept all the possible adverse reactions/side effects.  Please note  You were cared for by a hospitalist during your hospital stay. If you have any questions about your discharge medications or the care you received while you were in the hospital after you are discharged, you can call the unit and asked to speak with the hospitalist on call if the hospitalist that took care of you is not available. Once you are discharged, your primary care physician will handle any further medical issues. Please note that NO REFILLS for any discharge medications will be authorized once you are discharged, as it is imperative that you return to your primary care physician (or establish a relationship with a primary care physician if you do not have one) for your aftercare needs so that they can reassess your need for medications and monitor your lab values.  Today  Chief complaint-sore throat/mouth ulcers  HISTORY OF PRESENT ILLNESS:  67 y.o. female with a known history per below, chronic aphthous ulcerations for decades, exacerbated by methotrexate use over the last 2-3 years, accepted as a direct admit by ENT given inability to tolerate p.o., patient resting comfortably in bed, family at the bedside, patient is now being admitted for acute on chronic aphthous ulcerations most likely due to Crohn's which is exacerbated  by methotrexate use.  VITAL SIGNS:  Blood pressure 133/65, pulse 65, temperature 98.2 F (36.8 C), temperature source Oral, resp. rate 18, height 5' 1"  (1.549 m), weight 60.5 kg (133 lb 4.8 oz), SpO2 96 %.  I/O:   Intake/Output Summary (Last 24 hours) at 09/08/2017 0958  Last data filed at 09/08/2017 0725     Gross per 24 hour  Intake 2687 ml  Output 750 ml  Net 1937 ml   PHYSICAL EXAMINATION:  GENERAL: 67 y.o.-year-old patient lying in the bed with no acute distress.  EYES: Pupils equal, round, reactive to light and accommodation. No scleral icterus. Extraocular muscles intact.  HEENT: Head atraumatic, normocephalic. Oropharynx and nasopharynx clear.  NECK: Supple, no jugular venous distention. No thyroid enlargement, no tenderness.  LUNGS: Normal breath sounds bilaterally, no wheezing, rales,rhonchi or crepitation. No use of accessory muscles of respiration.  CARDIOVASCULAR: S1, S2 normal. No murmurs, rubs, or gallops.  ABDOMEN: Soft, non-tender, non-distended. Bowel sounds present. No organomegaly or mass.  EXTREMITIES: No pedal edema, cyanosis, or clubbing.  NEUROLOGIC: Cranial nerves II through XII are intact. Muscle strength 5/5 in all extremities. Sensation intact. Gait not checked.  PSYCHIATRIC: The patient is alert and oriented x 3.  SKIN: No obvious rash, lesion, or ulcer.  DATA REVIEW:  CBC  Last Labs                               Chemistries  Last Labs                                                                                  Cardiac Enzymes  Last Labs      Microbiology Results         Results for orders placed or performed during the hospital encounter of 05/03/17  Rapid strep screen Status: None   Collection Time: 05/03/17 7:12 PM  Result Value Ref Range Status   Streptococcus, Group A Screen (Direct) NEGATIVE NEGATIVE Final    Comment: (NOTE)  A Rapid Antigen test may result negative if the antigen level in the  sample is below  the detection level of this test. The FDA has not  cleared this test as a stand-alone test therefore the rapid antigen  negative result has reflexed to a Group A Strep culture.   Culture, group A strep Status: None   Collection Time: 05/03/17 7:12 PM  Result Value Ref Range Status   Specimen Description THROAT  Final   Special Requests NONE Reflexed from [id number]  Final   Culture   Final    NO GROUP A STREP (S.PYOGENES) ISOLATED  Performed at [facility], [address] [place], [place] [address]    Report Status 05/06/2017 FINAL  Final   RADIOLOGY:   Imaging Results (Last 48 hours)     EKG:      Orders placed or performed during the hospital encounter of 09/15/16  . EKG 12-Lead  . EKG 12-Lead  . EKG   Management plans discussed with the patient, family and they are in agreement.  CODE STATUS:            Code Status Orders   (From admission, onward)              Start   Ordered   09/06/17 1720  Full code Continuous  09/06/17 1719                        Code Status History     Date Active Date Inactive Code Status Order ID Comments User Context  09/04/2015 19:57 09/07/2015 18:58 Full Code [id number]  Hillary Bow, MD ED   06/04/2015 11:06 06/04/2015 15:08 Full Code [id number]  Hessie Knows, MD Inpatient   04/14/2015 18:40 04/14/2015 22:45 Full Code [id number]  Hessie Knows, MD Inpatient            Advance Directive Documentation     Most Recent Value  Type of Advance Directive Healthcare Power of Attorney, Living will  Pre-existing out of facility DNR order (yellow form or pink MOST form) No data  "MOST" Form in Place? No data     TOTAL TIME TAKING CARE OF THIS PATIENT: 45 minutes.  Avel Peace Salary M.D on 09/08/2017 at 9:58 AM  Between 7am to 6pm - Pager - [phone]  After 6pm go to [email or web address] - password [email or web address] [facility]  Office [phone]  CC:  Primary care physician; [name], MD  Note: This dictation  was prepared with Dragon dictation along with smaller phrase technology. Any transcriptional errors that result from this process are unintentional.

## 2017-11-16 ENCOUNTER — Other Ambulatory Visit: Payer: Self-pay | Admitting: Internal Medicine

## 2017-11-16 DIAGNOSIS — R270 Ataxia, unspecified: Secondary | ICD-10-CM

## 2017-11-21 ENCOUNTER — Ambulatory Visit: Payer: Medicare Other | Admitting: Obstetrics and Gynecology

## 2017-11-21 ENCOUNTER — Encounter: Payer: Medicare Other | Admitting: Obstetrics and Gynecology

## 2017-11-21 ENCOUNTER — Encounter: Payer: Self-pay | Admitting: Obstetrics and Gynecology

## 2017-11-21 VITALS — BP 126/79 | HR 90 | Ht 61.0 in | Wt 169.3 lb

## 2017-11-21 DIAGNOSIS — Z90722 Acquired absence of ovaries, bilateral: Secondary | ICD-10-CM

## 2017-11-21 DIAGNOSIS — N952 Postmenopausal atrophic vaginitis: Secondary | ICD-10-CM | POA: Diagnosis not present

## 2017-11-21 DIAGNOSIS — Z90710 Acquired absence of both cervix and uterus: Secondary | ICD-10-CM

## 2017-11-21 DIAGNOSIS — N941 Unspecified dyspareunia: Secondary | ICD-10-CM

## 2017-11-21 DIAGNOSIS — Z9079 Acquired absence of other genital organ(s): Secondary | ICD-10-CM

## 2017-11-21 NOTE — Progress Notes (Signed)
Chief complaint: 1.  Vaginal atrophy 2.  Dyspareunia  Nicole Bass presents today for 26-monthfollow-up.  She has severe vaginal atrophy and was instructed to use Remeron cream for treatment of this condition. The patient misunderstood instructions and was only using the Premarin cream with episodes of intercourse, not the typical frequency of the recommended therapeutic application.  She continues to have discomfort with intercourse. Currently taking amoxicillin for skin infection  Past medical history, past surgical history, problem list, medications, and allergies are reviewed  OBJECTIVE: BP 126/79   Pulse 90   Ht 5' 1"  (1.549 m)   Wt 169 lb 4.8 oz (76.8 kg)   BMI 31.99 kg/m  Pleasant female in no acute distress.  Alert and oriented. Breasts: Right breast with 7 mm area of hyperemia with central necrosis, possibly consistent with insect/spider bite PELVIC:             External Genitalia: Atrophic changes; agglutination of the labia minora to labia majora             BUS: Normal             Vagina: Severe atrophy; introitus admits only single digit comfortably; depth of vagina is foreshortened-4 cm in length; very pale in color with no rugae             Cervix: Surgically absent             Uterus: Surgically absent             Adnexa: Surgically absent  Rectovaginal-normal external exam  ASSESSMENT: 1.  Vaginal atrophy 2.  Dyspareunia 3.  Suboptimal use of previously prescribed estrogen cream  PLAN: 1.  Begin using Premarin cream 1/2 g intravaginal nightly times 30 days, then twice weekly application 2.  Return in 3 months for follow-up 3.  Continue using over-the-counter lubricants for intercourse including:   Coconut oil  Virgin olive oil  Astroglide  Jo H2O lubricant  A total of 15 minutes were spent face-to-face with the patient during this encounter and over half of that time dealt with counseling and coordination of care.  MBrayton Mars MD  Note: This  dictation was prepared with Dragon dictation along with smaller phrase technology. Any transcriptional errors that result from this process are unintentional.

## 2017-11-21 NOTE — Patient Instructions (Signed)
1.  Recommend Premarin cream 1/2 g intravaginal every day for 30 days, then twice weekly application 2.  Return in 3 months for follow-up 3.  Vaginal lubricants include the following:  Coconut oil  Virgin olive oil  Astroglide  Jo H2O lubricant

## 2017-11-29 ENCOUNTER — Ambulatory Visit
Admission: RE | Admit: 2017-11-29 | Discharge: 2017-11-29 | Disposition: A | Discharge status: Home / Self Care | Payer: Medicare Other | Source: Ambulatory Visit | Attending: Internal Medicine | Admitting: Internal Medicine

## 2017-11-29 DIAGNOSIS — R270 Ataxia, unspecified: Secondary | ICD-10-CM | POA: Diagnosis present

## 2017-12-18 DIAGNOSIS — R2689 Other abnormalities of gait and mobility: Secondary | ICD-10-CM | POA: Insufficient documentation

## 2018-02-08 DIAGNOSIS — E0590 Thyrotoxicosis, unspecified without thyrotoxic crisis or storm: Secondary | ICD-10-CM | POA: Insufficient documentation

## 2018-02-20 ENCOUNTER — Encounter: Payer: Medicare Other | Admitting: Obstetrics and Gynecology

## 2018-03-08 ENCOUNTER — Ambulatory Visit: Payer: Medicare Other | Admitting: Obstetrics and Gynecology

## 2018-03-08 VITALS — BP 125/73 | HR 80 | Wt 175.0 lb

## 2018-03-08 DIAGNOSIS — Z90722 Acquired absence of ovaries, bilateral: Secondary | ICD-10-CM

## 2018-03-08 DIAGNOSIS — N941 Unspecified dyspareunia: Secondary | ICD-10-CM | POA: Diagnosis not present

## 2018-03-08 DIAGNOSIS — Z90710 Acquired absence of both cervix and uterus: Secondary | ICD-10-CM | POA: Diagnosis not present

## 2018-03-08 DIAGNOSIS — Z9079 Acquired absence of other genital organ(s): Secondary | ICD-10-CM

## 2018-03-08 DIAGNOSIS — N952 Postmenopausal atrophic vaginitis: Secondary | ICD-10-CM | POA: Diagnosis not present

## 2018-03-08 NOTE — Patient Instructions (Signed)
1.  Continue using Premarin cream intravaginal every other night as directed 2.  Obtain vaginal dilator set from: www.vaginismus.com 3.  Do vaginal dilator exercises 3 times a week as tolerated 4.  Return in 6 weeks for follow-up

## 2018-03-08 NOTE — Progress Notes (Signed)
Pt states she is still having pain during intersourse.  Chief complaint: 1.  Vaginal atrophy 2.  Dyspareunia  Nicole Bass presents today for 33-monthfollow-up regarding painful intercourse.  She is status post hysterectomy.  She is status post surgical procedure of the vagina to help with postcoital bleeding years ago. Current major issue is vaginal atrophy and decreased vaginal depth along with introitus narrowing which has been contributing to painful sex. Patient has new husband who is significantly better endowed than prior husband. She continues to experience discomfort at the introitus with insertion as well as with deep penetration. She has been using Premarin cream intravaginal every other day for the past 3 months.  She has not noted any significant change in symptomatology. Patient states that she has a personal dildo at home but has not been able to use it because of its large size.  Past Medical History:  Diagnosis Date  . Anemia   . Arthritis   . Collagen vascular disease ([place]   . Crohn's disease ([place]   . DDD (degenerative disc disease), cervical   . DDD (degenerative disc disease), cervical   . DDD (degenerative disc disease), cervical 2003  . DDD (degenerative disc disease), lumbar   . Degenerative disc disease, cervical   . Difficult intubation    Past Surgical History:  Procedure Laterality Date  . ABDOMINAL HYSTERECTOMY    . APPENDECTOMY    . BACK SURGERY     x 3  . [place] . CHOLECYSTECTOMY    . COLON SURGERY  1999   removed 18 inches of colon  . DILATION AND CURETTAGE OF UTERUS  2004  . ELBOW FRACTURE SURGERY  2008  . JOINT REPLACEMENT Right 2015   partial knee replacement  . KYPHOPLASTY    . KYPHOPLASTY N/A 04/14/2015   Procedure: KYPHOPLASTY;  Surgeon: MHessie Knows MD;  Location: [facility] ORS;  Service: Orthopedics;  Laterality: N/A;  . KYPHOPLASTY N/A 06/04/2015   Procedure: KYPHOPLASTY L-5;  Surgeon: MHessie Knows MD;  Location:  [facility] ORS;  Service: Orthopedics;  Laterality: N/A;  . MEDIAL PARTIAL KNEE REPLACEMENT    . OOPHORECTOMY Bilateral   . SHOULDER ARTHROSCOPY WITH ROTATOR CUFF REPAIR AND SUBACROMIAL DECOMPRESSION Right 04/13/2016   Procedure: SHOULDER ARTHROSCOPY WITH ROTATOR CUFF REPAIR AND SUBACROMIAL DECOMPRESSION, release of long head biceps tendon;  Surgeon: HLeanor Kail MD;  Location: [facility] ORS;  Service: Orthopedics;  Laterality: Right;  . TOE SURGERY  2013  . TONSILLECTOMY    . WRIST FRACTURE SURGERY Bilateral    OBJECTIVE BP 125/73   Pulse 80   Wt 175 lb (79.4 kg)   BMI 33.07 kg/m  Pleasant well-appearing female in no acute distress.  Alert and oriented. Abdomen: Soft, nontender Bladder: Nontender Pelvic exam: External genitalia-atrophic changes present; some agglutination of the labia minora to majora is noted; no epithelial skin breakdown, no ulceration, no rash. BUS-normal Vagina-moderate to severe atrophy; the introitus is constricted and admits only a single digit comfortably; depth of vagina is approximately 4 cm (foreshortened); vaginal mucosa has decreased rugae and is pale in color. Cervix-surgically absent Uterus-surgically absent Adnexa-surgically absent Rectovaginal-normal external exam  ASSESSMENT: 1.  Vaginal atrophy, minimal improvement noted on exam 2.  Penetrance dyspareunia and deep thrusting dyspareunia related to enteritis constriction and vaginal depth along with the vaginal atrophy 3.  Status post TAH/BSO  PLAN: 1.  Continue using Premarin cream 1/2 g intravaginal every other night. 2.  Continue using lubricants as previously discussed 3.  Obtain vaginal dilator set from: www.vaginismus.com 4.  Perform vaginal dilator exercises 3 times a week as instructed 5.  Return in 6 weeks for follow-up  A total of 15 minutes were spent face-to-face with the patient during this encounter and over half of that time dealt with counseling and coordination of care.  Brayton Mars, MD  Note: This dictation was prepared with Dragon dictation along with smaller phrase technology. Any transcriptional errors that result from this process are unintentional.

## 2018-03-12 ENCOUNTER — Other Ambulatory Visit: Payer: Self-pay | Admitting: Internal Medicine

## 2018-03-12 DIAGNOSIS — Z1231 Encounter for screening mammogram for malignant neoplasm of breast: Secondary | ICD-10-CM

## 2018-04-05 ENCOUNTER — Ambulatory Visit
Admission: RE | Admit: 2018-04-05 | Discharge: 2018-04-05 | Disposition: A | Discharge status: Home / Self Care | Payer: Medicare Other | Source: Ambulatory Visit | Attending: Internal Medicine | Admitting: Internal Medicine

## 2018-04-05 DIAGNOSIS — Z1231 Encounter for screening mammogram for malignant neoplasm of breast: Secondary | ICD-10-CM | POA: Diagnosis not present

## 2018-05-08 ENCOUNTER — Encounter: Payer: Medicare Other | Admitting: Obstetrics and Gynecology

## 2018-05-29 ENCOUNTER — Encounter: Payer: Medicare Other | Admitting: Obstetrics and Gynecology

## 2018-06-04 DIAGNOSIS — I10 Essential (primary) hypertension: Secondary | ICD-10-CM | POA: Insufficient documentation

## 2018-06-07 ENCOUNTER — Encounter: Payer: Medicare Other | Admitting: Obstetrics and Gynecology

## 2018-08-23 ENCOUNTER — Encounter: Payer: Medicare Other | Admitting: Obstetrics and Gynecology

## 2018-08-23 ENCOUNTER — Emergency Department: Payer: Medicare Other

## 2018-08-23 ENCOUNTER — Inpatient Hospital Stay
Admission: EM | Admit: 2018-08-23 | Discharge: 2018-08-26 | DRG: 683 | Disposition: A | Discharge status: Home / Self Care | Payer: Medicare Other | Attending: Internal Medicine | Admitting: Internal Medicine

## 2018-08-23 ENCOUNTER — Other Ambulatory Visit: Payer: Self-pay

## 2018-08-23 DIAGNOSIS — Z882 Allergy status to sulfonamides status: Secondary | ICD-10-CM | POA: Diagnosis not present

## 2018-08-23 DIAGNOSIS — Z885 Allergy status to narcotic agent status: Secondary | ICD-10-CM

## 2018-08-23 DIAGNOSIS — K5010 Crohn's disease of large intestine without complications: Secondary | ICD-10-CM | POA: Diagnosis present

## 2018-08-23 DIAGNOSIS — R55 Syncope and collapse: Secondary | ICD-10-CM | POA: Diagnosis not present

## 2018-08-23 DIAGNOSIS — Z7982 Long term (current) use of aspirin: Secondary | ICD-10-CM

## 2018-08-23 DIAGNOSIS — Z881 Allergy status to other antibiotic agents status: Secondary | ICD-10-CM

## 2018-08-23 DIAGNOSIS — Z79891 Long term (current) use of opiate analgesic: Secondary | ICD-10-CM

## 2018-08-23 DIAGNOSIS — I10 Essential (primary) hypertension: Secondary | ICD-10-CM | POA: Diagnosis present

## 2018-08-23 DIAGNOSIS — E538 Deficiency of other specified B group vitamins: Secondary | ICD-10-CM | POA: Diagnosis present

## 2018-08-23 DIAGNOSIS — Z9049 Acquired absence of other specified parts of digestive tract: Secondary | ICD-10-CM | POA: Diagnosis not present

## 2018-08-23 DIAGNOSIS — M069 Rheumatoid arthritis, unspecified: Secondary | ICD-10-CM | POA: Diagnosis present

## 2018-08-23 DIAGNOSIS — M5136 Other intervertebral disc degeneration, lumbar region: Secondary | ICD-10-CM | POA: Diagnosis present

## 2018-08-23 DIAGNOSIS — M5030 Other cervical disc degeneration, unspecified cervical region: Secondary | ICD-10-CM | POA: Diagnosis present

## 2018-08-23 DIAGNOSIS — Z7952 Long term (current) use of systemic steroids: Secondary | ICD-10-CM | POA: Diagnosis not present

## 2018-08-23 DIAGNOSIS — T380X5A Adverse effect of glucocorticoids and synthetic analogues, initial encounter: Secondary | ICD-10-CM | POA: Diagnosis present

## 2018-08-23 DIAGNOSIS — E876 Hypokalemia: Secondary | ICD-10-CM | POA: Diagnosis present

## 2018-08-23 DIAGNOSIS — R197 Diarrhea, unspecified: Secondary | ICD-10-CM

## 2018-08-23 DIAGNOSIS — Z880 Allergy status to penicillin: Secondary | ICD-10-CM

## 2018-08-23 DIAGNOSIS — N179 Acute kidney failure, unspecified: Secondary | ICD-10-CM | POA: Diagnosis present

## 2018-08-23 DIAGNOSIS — Z825 Family history of asthma and other chronic lower respiratory diseases: Secondary | ICD-10-CM

## 2018-08-23 DIAGNOSIS — G2581 Restless legs syndrome: Secondary | ICD-10-CM | POA: Diagnosis present

## 2018-08-23 DIAGNOSIS — Z90710 Acquired absence of both cervix and uterus: Secondary | ICD-10-CM

## 2018-08-23 DIAGNOSIS — K5000 Crohn's disease of small intestine without complications: Secondary | ICD-10-CM | POA: Diagnosis not present

## 2018-08-23 DIAGNOSIS — D649 Anemia, unspecified: Secondary | ICD-10-CM | POA: Diagnosis present

## 2018-08-23 DIAGNOSIS — E860 Dehydration: Secondary | ICD-10-CM

## 2018-08-23 DIAGNOSIS — E039 Hypothyroidism, unspecified: Secondary | ICD-10-CM | POA: Diagnosis present

## 2018-08-23 DIAGNOSIS — Z888 Allergy status to other drugs, medicaments and biological substances status: Secondary | ICD-10-CM

## 2018-08-23 DIAGNOSIS — E1140 Type 2 diabetes mellitus with diabetic neuropathy, unspecified: Secondary | ICD-10-CM | POA: Diagnosis present

## 2018-08-23 DIAGNOSIS — Z9889 Other specified postprocedural states: Secondary | ICD-10-CM | POA: Diagnosis not present

## 2018-08-23 DIAGNOSIS — Z87440 Personal history of urinary (tract) infections: Secondary | ICD-10-CM

## 2018-08-23 DIAGNOSIS — R531 Weakness: Secondary | ICD-10-CM | POA: Diagnosis not present

## 2018-08-23 DIAGNOSIS — I959 Hypotension, unspecified: Secondary | ICD-10-CM | POA: Diagnosis present

## 2018-08-23 DIAGNOSIS — G8929 Other chronic pain: Secondary | ICD-10-CM | POA: Diagnosis present

## 2018-08-23 DIAGNOSIS — Z800 Family history of malignant neoplasm of digestive organs: Secondary | ICD-10-CM

## 2018-08-23 DIAGNOSIS — M818 Other osteoporosis without current pathological fracture: Secondary | ICD-10-CM | POA: Diagnosis present

## 2018-08-23 DIAGNOSIS — Z833 Family history of diabetes mellitus: Secondary | ICD-10-CM

## 2018-08-23 DIAGNOSIS — Z79899 Other long term (current) drug therapy: Secondary | ICD-10-CM

## 2018-08-23 DIAGNOSIS — Z96659 Presence of unspecified artificial knee joint: Secondary | ICD-10-CM | POA: Diagnosis present

## 2018-08-23 DIAGNOSIS — K909 Intestinal malabsorption, unspecified: Secondary | ICD-10-CM | POA: Diagnosis present

## 2018-08-23 LAB — C DIFFICILE QUICK SCREEN W PCR REFLEX
C DIFFICILE (CDIFF) INTERP: NOT DETECTED
C DIFFICILE (CDIFF) TOXIN: NEGATIVE
C DIFFICLE (CDIFF) ANTIGEN: NEGATIVE

## 2018-08-23 LAB — GASTROINTESTINAL PANEL BY PCR, STOOL (REPLACES STOOL CULTURE)

## 2018-08-23 LAB — CBC
HCT: 29.3 % — ABNORMAL LOW (ref 36.0–46.0)
HCT: 27.7 % — ABNORMAL LOW (ref 36.0–46.0)
Hemoglobin: 9.5 g/dL — ABNORMAL LOW (ref 12.0–15.0)
Hemoglobin: 8.8 g/dL — ABNORMAL LOW (ref 12.0–15.0)
MCH: 30.8 pg (ref 26.0–34.0)
MCH: 31 pg (ref 26.0–34.0)
MCHC: 32.4 g/dL (ref 30.0–36.0)
MCHC: 31.8 g/dL (ref 30.0–36.0)
MCV: 95.1 fL (ref 80.0–100.0)
MCV: 97.5 fL (ref 80.0–100.0)
NRBC: 0 % (ref 0.0–0.2)
PLATELETS: 254 10*3/uL (ref 150–400)
PLATELETS: 194 10*3/uL (ref 150–400)
RBC: 3.08 MIL/uL — ABNORMAL LOW (ref 3.87–5.11)
RBC: 2.84 MIL/uL — ABNORMAL LOW (ref 3.87–5.11)
RDW: 14.3 % (ref 11.5–15.5)
RDW: 14.3 % (ref 11.5–15.5)
WBC: 13.3 10*3/uL — ABNORMAL HIGH (ref 4.0–10.5)
WBC: 13 10*3/uL — ABNORMAL HIGH (ref 4.0–10.5)
nRBC: 0 % (ref 0.0–0.2)

## 2018-08-23 LAB — SAMPLE TO BLOOD BANK

## 2018-08-23 LAB — COMPREHENSIVE METABOLIC PANEL
ALT: 20 U/L (ref 0–44)
ANION GAP: 10 (ref 5–15)
AST: 27 U/L (ref 15–41)
Albumin: 3.4 g/dL — ABNORMAL LOW (ref 3.5–5.0)
Alkaline Phosphatase: 58 U/L (ref 38–126)
BILIRUBIN TOTAL: 0.6 mg/dL (ref 0.3–1.2)
BUN: 25 mg/dL — ABNORMAL HIGH (ref 8–23)
CHLORIDE: 103 mmol/L (ref 98–111)
CO2: 23 mmol/L (ref 22–32)
Calcium: 9 mg/dL (ref 8.9–10.3)
Creatinine, Ser: 1.58 mg/dL — ABNORMAL HIGH (ref 0.44–1.00)
GFR calc Af Amer: 39 mL/min — ABNORMAL LOW (ref >60)
GFR calc non Af Amer: 34 mL/min — ABNORMAL LOW (ref >60)
Glucose, Bld: 132 mg/dL — ABNORMAL HIGH (ref 70–99)
POTASSIUM: 3.4 mmol/L — AB (ref 3.5–5.1)
Sodium: 136 mmol/L (ref 135–145)
Total Protein: 6.6 g/dL (ref 6.5–8.1)

## 2018-08-23 LAB — GLUCOSE, CAPILLARY
GLUCOSE-CAPILLARY: 102 mg/dL — AB (ref 70–99)
Glucose-Capillary: 97 mg/dL (ref 70–99)

## 2018-08-23 LAB — TROPONIN I: Troponin I: <0.03 ng/mL (ref <0.03)

## 2018-08-23 LAB — CREATININE, SERUM
CREATININE: 1.21 mg/dL — AB (ref 0.44–1.00)
GFR calc Af Amer: 54 mL/min — ABNORMAL LOW (ref >60)
GFR calc non Af Amer: 47 mL/min — ABNORMAL LOW (ref >60)

## 2018-08-23 MED ORDER — ONDANSETRON HCL 4 MG PO TABS: 4.0000 mg | ORAL_TABLET | Freq: Four times a day (QID) | ORAL | Status: DC | PRN

## 2018-08-23 MED ORDER — PANTOPRAZOLE SODIUM 40 MG PO TBEC
40.0000 mg | DELAYED_RELEASE_TABLET | Freq: Every day | ORAL | Status: DC
  Administered 2018-08-24 – 2018-08-26 (×3): 40 mg via ORAL
  Filled 2018-08-23 (×3): qty 1

## 2018-08-23 MED ORDER — ADULT MULTIVITAMIN W/MINERALS CH
1.0000 | ORAL_TABLET | Freq: Every day | ORAL | Status: DC
  Administered 2018-08-24 – 2018-08-26 (×3): 1 via ORAL
  Filled 2018-08-23 (×3): qty 1

## 2018-08-23 MED ORDER — ESTROGENS, CONJUGATED 0.625 MG/GM VA CREA
0.2500 | TOPICAL_CREAM | VAGINAL | Status: DC
  Filled 2018-08-23: qty 30

## 2018-08-23 MED ORDER — SENNOSIDES-DOCUSATE SODIUM 8.6-50 MG PO TABS: 1.0000 | ORAL_TABLET | Freq: Every evening | ORAL | Status: DC | PRN

## 2018-08-23 MED ORDER — ACETAMINOPHEN 500 MG PO TABS
1000.0000 mg | ORAL_TABLET | Freq: Once | ORAL | Status: AC
  Administered 2018-08-23: 1000 mg via ORAL
  Filled 2018-08-23: qty 2

## 2018-08-23 MED ORDER — SODIUM CHLORIDE 0.9 % IV BOLUS
1000.0000 mL | Freq: Once | INTRAVENOUS | Status: AC
  Administered 2018-08-23: 1000 mL via INTRAVENOUS

## 2018-08-23 MED ORDER — LEUCOVORIN CALCIUM 5 MG PO TABS
5.0000 mg | ORAL_TABLET | Freq: Every day | ORAL | Status: DC
  Administered 2018-08-24 – 2018-08-26 (×3): 5 mg via ORAL
  Filled 2018-08-23 (×3): qty 1

## 2018-08-23 MED ORDER — SUCRALFATE 1 G PO TABS
1.0000 g | ORAL_TABLET | Freq: Three times a day (TID) | ORAL | Status: DC | PRN
  Filled 2018-08-23: qty 1

## 2018-08-23 MED ORDER — ACETAMINOPHEN 325 MG PO TABS
650.0000 mg | ORAL_TABLET | Freq: Four times a day (QID) | ORAL | Status: DC | PRN
  Administered 2018-08-23 – 2018-08-24 (×2): 650 mg via ORAL
  Filled 2018-08-23 (×2): qty 2

## 2018-08-23 MED ORDER — RENA-VITE PO TABS
1.0000 | ORAL_TABLET | Freq: Every day | ORAL | Status: DC
  Administered 2018-08-24 – 2018-08-26 (×3): 1 via ORAL
  Filled 2018-08-23 (×3): qty 1

## 2018-08-23 MED ORDER — VITAMIN D 25 MCG (1000 UNIT) PO TABS
1000.0000 [IU] | ORAL_TABLET | Freq: Every day | ORAL | Status: DC
  Administered 2018-08-24 – 2018-08-26 (×3): 1000 [IU] via ORAL
  Filled 2018-08-23 (×4): qty 1

## 2018-08-23 MED ORDER — HYDROCORTISONE NA SUCCINATE PF 100 MG IJ SOLR
100.0000 mg | Freq: Once | INTRAMUSCULAR | Status: AC
  Administered 2018-08-23: 100 mg via INTRAVENOUS
  Filled 2018-08-23: qty 2

## 2018-08-23 MED ORDER — SODIUM CHLORIDE 0.9 % IV SOLN
INTRAVENOUS | Status: DC
  Administered 2018-08-23 – 2018-08-24 (×3): via INTRAVENOUS

## 2018-08-23 MED ORDER — ROPINIROLE HCL 1 MG PO TABS: 1.0000 mg | ORAL_TABLET | Freq: Every evening | ORAL | Status: DC | PRN

## 2018-08-23 MED ORDER — KETOROLAC TROMETHAMINE 15 MG/ML IJ SOLN
15.0000 mg | Freq: Four times a day (QID) | INTRAMUSCULAR | Status: DC | PRN
  Administered 2018-08-23 – 2018-08-26 (×5): 15 mg via INTRAVENOUS
  Filled 2018-08-23 (×7): qty 1

## 2018-08-23 MED ORDER — POTASSIUM CHLORIDE CRYS ER 20 MEQ PO TBCR
20.0000 meq | EXTENDED_RELEASE_TABLET | Freq: Two times a day (BID) | ORAL | Status: DC
  Administered 2018-08-23 – 2018-08-24 (×2): 20 meq via ORAL
  Filled 2018-08-23 (×2): qty 1

## 2018-08-23 MED ORDER — LORATADINE 10 MG PO TABS
10.0000 mg | ORAL_TABLET | Freq: Every day | ORAL | Status: DC
  Administered 2018-08-24 – 2018-08-26 (×3): 10 mg via ORAL
  Filled 2018-08-23 (×4): qty 1

## 2018-08-23 MED ORDER — ACETAMINOPHEN 650 MG RE SUPP: 650.0000 mg | Freq: Four times a day (QID) | RECTAL | Status: DC | PRN

## 2018-08-23 MED ORDER — ASPIRIN EC 81 MG PO TBEC
81.0000 mg | DELAYED_RELEASE_TABLET | Freq: Every day | ORAL | Status: DC
  Administered 2018-08-24 – 2018-08-26 (×3): 81 mg via ORAL
  Filled 2018-08-23 (×3): qty 1

## 2018-08-23 MED ORDER — HYDROCORTISONE NA SUCCINATE PF 100 MG IJ SOLR
50.0000 mg | Freq: Two times a day (BID) | INTRAMUSCULAR | Status: DC
  Administered 2018-08-24 – 2018-08-25 (×3): 50 mg via INTRAVENOUS
  Filled 2018-08-23 (×4): qty 1

## 2018-08-23 MED ORDER — GABAPENTIN 400 MG PO CAPS
400.0000 mg | ORAL_CAPSULE | Freq: Every day | ORAL | Status: DC
  Administered 2018-08-23 – 2018-08-25 (×3): 400 mg via ORAL
  Filled 2018-08-23: qty 1
  Filled 2018-08-23 (×2): qty 4
  Filled 2018-08-23 (×3): qty 1

## 2018-08-23 MED ORDER — HEPARIN SODIUM (PORCINE) 5000 UNIT/ML IJ SOLN
5000.0000 [IU] | Freq: Three times a day (TID) | INTRAMUSCULAR | Status: DC
  Administered 2018-08-23 – 2018-08-26 (×7): 5000 [IU] via SUBCUTANEOUS
  Filled 2018-08-23 (×7): qty 1

## 2018-08-23 MED ORDER — TOFACITINIB CITRATE 5 MG PO TABS
5.0000 mg | ORAL_TABLET | Freq: Two times a day (BID) | ORAL | Status: DC
  Administered 2018-08-24 – 2018-08-25 (×2): 5 mg via ORAL

## 2018-08-23 MED ORDER — KETOROLAC TROMETHAMINE 30 MG/ML IJ SOLN
15.0000 mg | Freq: Once | INTRAMUSCULAR | Status: AC
  Administered 2018-08-23: 15 mg via INTRAVENOUS
  Filled 2018-08-23: qty 1

## 2018-08-23 MED ORDER — ONDANSETRON HCL 4 MG/2ML IJ SOLN: 4.0000 mg | Freq: Four times a day (QID) | INTRAMUSCULAR | Status: DC | PRN

## 2018-08-23 MED ORDER — METHIMAZOLE 5 MG PO TABS
2.5000 mg | ORAL_TABLET | Freq: Every day | ORAL | Status: DC
  Administered 2018-08-24 – 2018-08-26 (×3): 2.5 mg via ORAL
  Filled 2018-08-23 (×3): qty 1

## 2018-08-23 MED ORDER — POTASSIUM CHLORIDE CRYS ER 20 MEQ PO TBCR
40.0000 meq | EXTENDED_RELEASE_TABLET | Freq: Once | ORAL | Status: DC
  Filled 2018-08-23: qty 2

## 2018-08-23 MED ORDER — CYANOCOBALAMIN 1000 MCG/ML IJ SOLN: 1000.0000 ug | INTRAMUSCULAR | Status: DC

## 2018-08-23 NOTE — ED Provider Notes (Signed)
-----------------------------------------   10:37 AM on 08/23/2018 -----------------------------------------   Blood pressure (!) 97/50, pulse 99, temperature 98.7 F (37.1 C), temperature source Oral, resp. rate 17, height 5' 1"  (1.549 m), weight 81.6 kg, SpO2 99 %.  Assuming care from Dr. Owens Shark of Nicole Bass is a 68 y.o. female with a chief complaint of Weakness .    Please refer to H&P by previous MD for further details.  Patient remains tachycardic and hypotensive after 2 L of IVF. She is on chronic prednisone, will give a dose of hydrocortisone. Will start patient on 3rd bolus. Keeping food down. No vomiting, no abdominal pain or tenderness. Will discuss with Hospitalist for admission.        Alfred Levins, [place], MD 08/23/18 1038

## 2018-08-23 NOTE — ED Notes (Signed)
Patient in room. Patient states pain is not as bad and is a 5/10 in her right leg. Patient's color looks better.

## 2018-08-23 NOTE — ED Notes (Signed)
Resumed care from ally rn.  Pt alert.  Pt waiting on admission.  Pt up to br with assistance.  PT in with pt.

## 2018-08-23 NOTE — ED Notes (Signed)
Pt given meal tray.

## 2018-08-23 NOTE — ED Notes (Signed)
Husband to bedside

## 2018-08-23 NOTE — ED Provider Notes (Signed)
[facility] Emergency Department Provider Note   First MD Initiated Contact with Patient 08/23/18 0413     (approximate)  I have reviewed the triage vital signs and the nursing notes.   HISTORY  Chief Complaint Weakness    HPI Nicole Bass is a 68 y.o. female below list of chronic medical conditions including Crohn's disease and anemia presents to the emergency department with acute onset of diarrhea and generalized weakness which began tonight.  Patient states that she is currently being treated with antibiotic therapy for a sinus infection which she has been taking antibiotics since Saturday.  Patient states that she had 2 large bowel movements tonight and that "my legs just gave out".  Paramedics states that the patient had one episode of defecation on her self.  Patient denies any head injury or loss of consciousness on the fall.  Patient does admit to right hip/leg pain which she states is chronic but however worse since falling on that side".   Past Medical History:  Diagnosis Date  . Anemia   . Arthritis   . Collagen vascular disease ([place])   . Crohn's disease ([place])   . DDD (degenerative disc disease), cervical   . DDD (degenerative disc disease), cervical   . DDD (degenerative disc disease), cervical 2003  . DDD (degenerative disc disease), lumbar   . Degenerative disc disease, cervical   . Difficult intubation     Patient Active Problem List   Diagnosis Date Noted  . Stomatitis, ulcerative 09/06/2017  . Dyspareunia in female 08/22/2017  . Vaginal atrophy 08/22/2017  . Obesity (BMI 30.0-34.9) 08/22/2017  . Crohn's colitis ([place]) 09/04/2015    Past Surgical History:  Procedure Laterality Date  . ABDOMINAL HYSTERECTOMY    . APPENDECTOMY    . BACK SURGERY     x 3  . [place]  . CHOLECYSTECTOMY    . COLON SURGERY  1999   removed 18 inches of colon  . DILATION AND CURETTAGE OF UTERUS  2004  . ELBOW FRACTURE  SURGERY  2008  . JOINT REPLACEMENT Right 2015   partial knee replacement  . KYPHOPLASTY    . KYPHOPLASTY N/A 04/14/2015   Procedure: KYPHOPLASTY;  Surgeon: Hessie Knows, MD;  Location: [facility] ORS;  Service: Orthopedics;  Laterality: N/A;  . KYPHOPLASTY N/A 06/04/2015   Procedure: KYPHOPLASTY L-5;  Surgeon: Hessie Knows, MD;  Location: [facility] ORS;  Service: Orthopedics;  Laterality: N/A;  . MEDIAL PARTIAL KNEE REPLACEMENT    . OOPHORECTOMY Bilateral   . SHOULDER ARTHROSCOPY WITH ROTATOR CUFF REPAIR AND SUBACROMIAL DECOMPRESSION Right 04/13/2016   Procedure: SHOULDER ARTHROSCOPY WITH ROTATOR CUFF REPAIR AND SUBACROMIAL DECOMPRESSION, release of long head biceps tendon;  Surgeon: Leanor Kail, MD;  Location: [facility] ORS;  Service: Orthopedics;  Laterality: Right;  . TOE SURGERY  2013  . TONSILLECTOMY    . WRIST FRACTURE SURGERY Bilateral     Prior to Admission medications   Medication Sig Start Date End Date Taking? Authorizing Provider  aspirin EC 81 MG tablet Take 81 mg by mouth daily.    [provider]  buPROPion (WELLBUTRIN SR) 150 MG 12 hr tablet Take 150 mg by mouth 2 (two) times daily.    [provider]  Calcium-Vitamin D-Vitamin K (VIACTIV PO) Take 2 Doses by mouth every evening.    [provider]  cetirizine (ZYRTEC) 10 MG tablet Take 10 mg by mouth daily.    [provider]  chlorhexidine (  PERIDEX) 0.12 % solution Use as directed 15 mLs in the mouth or throat 4 (four) times daily. 09/08/17   Salary, Avel Peace, MD  conjugated estrogens (PREMARIN) vaginal cream Place 7.65 Applicatorfuls vaginally 2 (two) times a week. 08/24/17   Defrancesco, Alanda Slim, MD  cyanocobalamin (,VITAMIN B-12,) 1000 MCG/ML injection Inject 1,000 mcg into the muscle every 30 (thirty) days.    [provider]  Cyanocobalamin (VITAMIN B-12) 6000 MCG SUBL Place 6,000 mcg under the tongue daily.    [provider]  diphenoxylate-atropine (LOMOTIL) 2.5-0.025 MG per  tablet Take 2 tablets by mouth every morning.     [provider]  folic acid (FOLVITE) 1 MG tablet Take 1 mg by mouth daily.    [provider]  furosemide (LASIX) 20 MG tablet Take 20 mg by mouth every morning.     [provider]  leucovorin (WELLCOVORIN) 5 MG tablet Take 1 tablet (5 mg total) by mouth daily. 09/08/17   Salary, Avel Peace, MD  lidocaine (XYLOCAINE) 2 % solution Use as directed 15 mLs in the mouth or throat every 4 (four) hours as needed for mouth pain. 09/08/17   Salary, Holly Bodily D, MD  magnesium oxide (MAG-OX) 400 MG tablet Take 400 mg by mouth daily.     [provider]  Multiple Vitamins-Minerals (MULTIVITAMIN WITH MINERALS) tablet Take 1 tablet by mouth daily.    [provider]  omeprazole (PRILOSEC) 40 MG capsule Take 40 mg by mouth every morning.     [provider]  phenol (CHLORASEPTIC) 1.4 % LIQD Use as directed 1 spray in the mouth or throat as needed for throat irritation / pain. 09/08/17   Salary, Avel Peace, MD  potassium chloride SA (K-DUR,KLOR-CON) 20 MEQ tablet Take 20 mEq by mouth daily.     [provider]  predniSONE (DELTASONE) 50 MG tablet 1 daily 09/08/17   Salary, Avel Peace, MD  Probiotic Product (PROBIOTIC PO) Take 2 tablets by mouth every morning.    [provider]  rOPINIRole (REQUIP) 0.5 MG tablet Take 0.5 mg by mouth at bedtime as needed (for restless legs).     [provider]  sucralfate (CARAFATE) 1 g tablet Take 1 g by mouth 3 (three) times daily with meals as needed (for stomach cramping).    [provider]  Tofacitinib Citrate (XELJANZ) 5 MG TABS Take by mouth.    [provider]  traMADol (ULTRAM) 50 MG tablet Take by mouth every 6 (six) hours as needed.    [provider]  valACYclovir (VALTREX) 500 MG tablet Take 500 mg by mouth 2 (two) times daily.    [provider]    Allergies Ciprofloxacin; Levofloxacin; Gabapentin;  Methotrexate derivatives; Sulfa antibiotics; Cefazolin; Cefprozil; Cephalosporins; Enbrel [etanercept]; Humira [adalimumab]; Hydrocodone-acetaminophen; Levaquin [levofloxacin in d5w]; Lorabid [loracarbef]; Meropenem; Oxycodone; Oxycodone-acetaminophen; and Primidone  Family History  Problem Relation Age of Onset  . Diabetes Mother   . Emphysema Mother   . Emphysema Father   . Colon cancer Maternal Grandfather   . Breast cancer Neg Hx   . Ovarian cancer Neg Hx     Social History Social History   Tobacco Use  . Smoking status: Never Smoker  . Smokeless tobacco: Never Used  Substance Use Topics  . Alcohol use: No  . Drug use: No    Review of Systems Constitutional: No fever/chills Eyes: No visual changes. ENT: No sore throat. Cardiovascular: Denies chest pain. Respiratory: Denies shortness of breath. Gastrointestinal: No  abdominal pain.  Positive for diarrhea genitourinary: Negative for dysuria. Musculoskeletal: Negative for neck pain.  Negative for back pain.  Positive for right hip/leg pain Integumentary: Negative for rash. Neurological: Negative for headaches, focal weakness or numbness.  Positive for generalized weakness  ____________________________________________   PHYSICAL EXAM:  VITAL SIGNS: ED Triage Vitals  Enc Vitals Group     BP 08/23/18 0421 (!) 53/41     Pulse Rate 08/23/18 0424 90     Resp 08/23/18 0421 (!) 25     Temp 08/23/18 0424 (P) 98.3 F (36.8 C)     Temp Source 08/23/18 0424 (P) Oral     SpO2 08/23/18 0424 92 %     Weight 08/23/18 0416 81.6 kg (180 lb)     Height 08/23/18 0416 1.549 m (5' 1" )     Head Circumference --      Peak Flow --      Pain Score 08/23/18 0414 7     Pain Loc --      Pain Edu? --      Excl. in [place]? --     Constitutional: Alert and oriented.  Eyes: Conjunctivae are normal.  Mouth/Throat: Mucous membranes are dry.  Oropharynx non-erythematous. Neck: No stridor.   Cardiovascular: Normal rate, regular rhythm. Good  peripheral circulation. Grossly normal heart sounds. Respiratory: Normal respiratory effort.  No retractions. Lungs CTAB. Gastrointestinal: Soft and nontender. No distention.  Musculoskeletal: No lower extremity tenderness nor edema. No gross deformities of extremities. Neurologic:  Normal speech and language. No gross focal neurologic deficits are appreciated.  Skin:  Skin is warm, dry and intact. No rash noted. Psychiatric: Mood and affect are normal. Speech and behavior are normal.  ____________________________________________   LABS (all labs ordered are listed, but only abnormal results are displayed)  Labs Reviewed  CBC - Abnormal; Notable for the following components:      Result Value   WBC 13.3 (*)    RBC 3.08 (*)    Hemoglobin 9.5 (*)    HCT 29.3 (*)    All other components within normal limits  COMPREHENSIVE METABOLIC PANEL - Abnormal; Notable for the following components:   Potassium 3.4 (*)    Glucose, Bld 132 (*)    BUN 25 (*)    Creatinine, Ser 1.58 (*)    Albumin 3.4 (*)    GFR calc non Af Amer 34 (*)    GFR calc Af Amer 39 (*)    All other components within normal limits  GLUCOSE, CAPILLARY - Abnormal; Notable for the following components:   Glucose-Capillary 102 (*)    All other components within normal limits  GASTROINTESTINAL PANEL BY PCR, STOOL (REPLACES STOOL CULTURE)  C DIFFICILE QUICK SCREEN W PCR REFLEX  TROPONIN I  SAMPLE TO BLOOD BANK   ____________________________________________  EKG   ED ECG REPORT I, [place] N Jeral Zick, the attending physician, personally viewed and interpreted this ECG.   Date: 08/23/2018  EKG Time: 4:20 AM  Rate: 78  Rhythm: Normal sinus rhythm  Axis: Normal  Intervals: Normal  ST&T Change: None  ____________________________________________  RADIOLOGY I, [place] N Clorinda Wyble, personally viewed and evaluated these images (plain radiographs) as part of my medical decision making, as well as reviewing the written  report by the radiologist.  ED MD interpretation:   No acute findings on CT scan of the head per radiologist. X-ray of the right hip per radiologist.   Official radiology report(s): Ct Head Wo Contrast  Result Date: 08/23/2018 CLINICAL  DATA:  Altered level of consciousness EXAM: CT HEAD WITHOUT CONTRAST TECHNIQUE: Contiguous axial images were obtained from the base of the skull through the vertex without intravenous contrast. COMPARISON:  11/29/2017 brain MRI FINDINGS: Brain: No evidence of acute infarction, hemorrhage, hydrocephalus, extra-axial collection or mass lesion/mass effect. Vascular: No hyperdense vessel or unexpected calcification. Skull: Normal. Negative for fracture or focal lesion. Sinuses/Orbits: No acute finding. IMPRESSION: Negative head CT. Electronically Signed   By: Monte Fantasia M.D.   On: 08/23/2018 06:07   Dg Hip Unilat W Or Wo Pelvis 2-3 Views Right  Result Date: 08/23/2018 CLINICAL DATA:  Right hip pain after fall. EXAM: DG HIP (WITH OR WITHOUT PELVIS) 2-3V RIGHT COMPARISON:  None. FINDINGS: There is no evidence of hip fracture or dislocation. Prior L5 vertebroplasty. IMPRESSION: Negative. Electronically Signed   By: Monte Fantasia M.D.   On: 08/23/2018 06:04     Procedures   ____________________________________________   INITIAL IMPRESSION / ASSESSMENT AND PLAN / ED COURSE  As part of my medical decision making, I reviewed the following data within the electronic MEDICAL RECORD NUMBER  68 year old female presenting with above-stated history and physical exam secondary to diarrheal illness and generalized weakness.  Concern for possible C. difficile given recent and current antibiotic use and as such C. difficile ordered.  Patient given 2 L IV normal saline with improvement of blood pressure and clinical status.  Awaiting C. difficile and stool cultures at this time. ____________________________________________  FINAL CLINICAL IMPRESSION(S) / ED DIAGNOSES  Final  diagnoses:  Weakness  Dehydration  Diarrhea of presumed infectious origin     MEDICATIONS GIVEN DURING THIS VISIT:  Medications  ketorolac (TORADOL) 30 MG/ML injection 15 mg (15 mg Intravenous Given 08/23/18 0507)  sodium chloride 0.9 % bolus 1,000 mL (0 mLs Intravenous Stopped 08/23/18 0520)  sodium chloride 0.9 % bolus 1,000 mL (1,000 mLs Intravenous New Bag/Given 08/23/18 0615)     ED Discharge Orders    None       Note:  This document was prepared using Dragon voice recognition software and may include unintentional dictation errors.    Gregor Hams, MD 08/23/18 [phone]

## 2018-08-23 NOTE — ED Notes (Signed)
Pt took her own potassium, prilosec, baby ASA, vitamin D, B complex vitamin and her thyroid medication at bedside. Requested patient to take hospital provided medications but she wants to take her home medications.

## 2018-08-23 NOTE — Evaluation (Signed)
Physical Therapy Evaluation Patient Details Name: Nicole Bass MRN: [id number] DOB: [birth year] Today's Date: 08/23/2018   History of Present Illness  68 y.o. female with a known history of Chrohn's Disease on chronic steroids and anemai who presents to the ED with generalized weakness and diarrhea.  She had a syncopal episode, knees buckling, fall.  Clinical Impression  Pt showed good effort with PT exam and though she did have some dizziness in sitting and getting to standing symptoms were transient and she was able to continue working with PT with low BP (not critically low or overly symptomatic, was 110/55 in sitting post session).   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  Overall pt is weak; pain an issue in R LE but very weak in b/l UEs.       Follow Up Recommendations Home health PT    Equipment Recommendations  None recommended by PT    Recommendations for Other Services       Precautions / Restrictions Precautions Precautions: Fall Restrictions Weight Bearing Restrictions: No      Mobility  Bed Mobility Overal bed mobility: Needs Assistance Bed Mobility: Supine to Sit;Sit to Supine     Supine to sit: Mod assist Sit to supine: Min guard   General bed mobility comments: Pt able to minimally get LEs toward EOB, needed heavy assist to get torso upright.  Did much better getting back into bed with only CGA with LEs  Transfers Overall transfer level: Needs assistance Equipment used: Rolling walker (2 wheeled) Transfers: Sit to/from Stand Sit to Stand: Min assist         General transfer comment: Minimal assist to insure that she got to full standing and felt okay.  Needed a minute initially secondary to minor dizziness (BP as 90/63 initially in standing)  Ambulation/Gait Ambulation/Gait assistance: Min  guard Gait Distance (Feet): 60 Feet Assistive device: Rolling walker (2 wheeled)       General Gait Details: Pt was able to do multiple small loops in the room using walker.  She did not need direct assist t/o the effort but clearly was limping on R LE and reliant on the walker.    Stairs            Wheelchair Mobility    Modified Rankin (Stroke Patients Only)       Balance Overall balance assessment: Modified Independent                                           Pertinent Vitals/Pain Pain Assessment: 0-10 Pain Score: 4  Pain Location: R knee and generally in [place] expects to be discharged to:: Private residence Living Arrangements: Spouse/significant other Available Help at Discharge: Family;Available PRN/intermittently(husband at work t/o the day) Type of Home: House Home Access: Stairs to enter Entrance Stairs-Rails: Can reach both Entrance Stairs-Number of Steps: [place]: Environmental consultant - 2 wheels;Cane - single point      Prior Function Level of Independence: Independent         Comments: Pt only ever uses AD when she has rheumatoid flare up (R LE), otherwise out of the home daily and able to be very independent/active  Hand Dominance        Extremity/Trunk Assessment   Upper Extremity Assessment Upper Extremity Assessment: Generalized weakness(grossly 3+/5, feels subjectively very weak)    Lower Extremity Assessment Lower Extremity Assessment: Generalized weakness(R LE grossly 3/5 (+ pain), L LE grossly 4/5)       Communication   Communication: No difficulties  Cognition Arousal/Alertness: Awake/alert Behavior During Therapy: WFL for tasks assessed/performed;Anxious Overall Cognitive Status: Within Functional Limits for tasks assessed                                        General Comments      Exercises     Assessment/Plan    PT Assessment Patient needs  continued PT services  PT Problem List Decreased strength;Decreased range of motion;Decreased activity tolerance;Decreased balance;Decreased mobility;Decreased coordination;Decreased knowledge of use of DME;Decreased safety awareness;Decreased knowledge of precautions;Cardiopulmonary status limiting activity       PT Treatment Interventions Stair training;Functional mobility training;DME instruction;Therapeutic activities;Therapeutic exercise;Balance training;Neuromuscular re-education;Patient/family education;Gait training    PT Goals (Current goals can be found in the Care Plan section)  Acute Rehab PT Goals Patient Stated Goal: get strength back  PT Goal Formulation: With patient Time For Goal Achievement: 09/06/18 Potential to Achieve Goals: Good    Frequency Min 2X/week   Barriers to discharge        Co-evaluation               AM-PAC PT "6 Clicks" Mobility  Outcome Measure Help needed turning from your back to your side while in a flat bed without using bedrails?: A Little Help needed moving from lying on your back to sitting on the side of a flat bed without using bedrails?: A Lot Help needed moving to and from a bed to a chair (including a wheelchair)?: A Little Help needed standing up from a chair using your arms (e.g., wheelchair or bedside chair)?: A Little Help needed to walk in hospital room?: A Little Help needed climbing 3-5 steps with a railing? : A Little 6 Click Score: 17    End of Session Equipment Utilized During Treatment: Gait belt Activity Tolerance: Patient limited by fatigue;Patient tolerated treatment well Patient left: in bed;with call bell/phone within reach Nurse Communication: Mobility status PT Visit Diagnosis: Muscle weakness (generalized) (M62.81);Difficulty in walking, not elsewhere classified (R26.2);Dizziness and giddiness (R42)    Time: 1435-1500 PT Time Calculation (min) (ACUTE ONLY): 25 min   Charges:   PT Evaluation $PT Eval  Low Complexity: 1 Low          Kreg Shropshire, DPT 08/23/2018, 4:22 PM

## 2018-08-23 NOTE — ED Notes (Signed)
Left AC iv dc'ed.  Infiltrated.  Family with pt now.

## 2018-08-23 NOTE — ED Triage Notes (Signed)
Per ems, pt has had a sinus infection this week and she is taking antibiotics. Tonight she got up to go to the bathroom, legs got weak, fell, had diarrhea on herself. Pt reports  2 episodes of diarrhea,and pain in the right leg. Vitals per EMS: 106/54, 115 lying,  92/52, 124 sitting

## 2018-08-23 NOTE — ED Notes (Signed)
EDP aware of patient's recent hypotensive reading of BP.

## 2018-08-23 NOTE — Progress Notes (Signed)
Family Meeting Note  Advance Directive:no  Today a meeting took place with the Patient.  The following clinical team members were present during this meeting:MD  The following were discussed:Patient's diagnosis: Hypotension with acute kidney injury and diarrhea, Patient's progosis: > 12 months and Goals for treatment: Full Code  Additional follow-up to be provided: Chaplain consultation to create advanced directives  Time spent during discussion: 16 minutes  Nicole Peaster, MD

## 2018-08-23 NOTE — ED Notes (Signed)
Pt assisted up to bathroom.

## 2018-08-23 NOTE — ED Notes (Signed)
EDP in with patient.

## 2018-08-23 NOTE — Progress Notes (Signed)
Chaplain responded to an OR for an AD. Pt said they already had one. Pt talked about her illness and her concern about her husband   08/23/18 1100  Clinical Encounter Type  Visited With Patient and family together  Visit Type Initial  Referral From Physician  Spiritual Encounters  Spiritual Needs Brochure;Prayer   who has gone to work. A friend is by the bedside. Chaplain prayed for her restored health and her care.

## 2018-08-23 NOTE — ED Notes (Addendum)
Pt appears more appropriate now that fluids are infusing and husband is at the bedside. Pt states she did not take a gabapentin at 0300 but took a tramadol.

## 2018-08-23 NOTE — ED Notes (Signed)
Pt husband out to nurses station states that pt is shaking again as she did last night prior to coming to ER. "she is doing what she started all of this". This RN into patient room, pt alert and oriented X 4, pt with rigors, states that she is cold. EDP aware and into room to see patient at this time. CBG checked, WNL. VSS. Given warm blankets and turned up heat in room.

## 2018-08-23 NOTE — ED Notes (Signed)
Vitals validated are incorrect at this time.

## 2018-08-23 NOTE — ED Notes (Addendum)
C/o epigastric pain. EDP aware. Pt on cardiac monitor. VSS. Pt takes 59m Prilosec daily at home, did not take today. Hx of acid reflux per patient order. Will inform admitting MD.

## 2018-08-23 NOTE — ED Notes (Signed)
Report called to phyliss rn floor nurse

## 2018-08-23 NOTE — ED Notes (Signed)
Given breakfast. Rigors have stopped. Pt able to sit up in bed and eat breakfast.

## 2018-08-23 NOTE — H&P (Signed)
[facility] [facility] [place] NAME: Nicole Bass    MR#:  [id number]  DATE OF BIRTH:  1951-05-17  DATE OF ADMISSION:  08/23/2018  PRIMARY CARE PHYSICIAN: [name], MD   REQUESTING/REFERRING PHYSICIAN: dr Alfred Levins  CHIEF COMPLAINT:   Diarrhea weakness HISTORY OF PRESENT ILLNESS:  Nicole Bass  is a 68 y.o. female with a known history of Chrohn's Disease on chronic steroids and anemai who presents to the ED with generalized weakness and diarrhea. Patient on antibiotics for a sinus infection which has caused her to have diarrhea. She reports her legs gave out and defecated on herself. She was brought to the ER for further workup because she had presyncope. She did not hit her head or have LOC. She denies sick contacts, fever, abdominal pain, nausea/vomitting.  She is found to have persistent hypotension despite fluid rescucitation and has acute kidney injury.  PAST MEDICAL HISTORY:   Past Medical History:  Diagnosis Date  . Anemia   . Arthritis   . Collagen vascular disease ([place])   . Crohn's disease ([place])   . DDD (degenerative disc disease), cervical   . DDD (degenerative disc disease), cervical   . DDD (degenerative disc disease), cervical 2003  . DDD (degenerative disc disease), lumbar   . Degenerative disc disease, cervical   . Difficult intubation     PAST SURGICAL HISTORY:   Past Surgical History:  Procedure Laterality Date  . ABDOMINAL HYSTERECTOMY    . APPENDECTOMY    . BACK SURGERY     x 3  . [place]  . CHOLECYSTECTOMY    . COLON SURGERY  1999   removed 18 inches of colon  . DILATION AND CURETTAGE OF UTERUS  2004  . ELBOW FRACTURE SURGERY  2008  . JOINT REPLACEMENT Right 2015   partial knee replacement  . KYPHOPLASTY    . KYPHOPLASTY N/A 04/14/2015   Procedure: KYPHOPLASTY;  Surgeon: Hessie Knows, MD;  Location: [facility] ORS;  Service: Orthopedics;  Laterality: N/A;  . KYPHOPLASTY N/A  06/04/2015   Procedure: KYPHOPLASTY L-5;  Surgeon: Hessie Knows, MD;  Location: [facility] ORS;  Service: Orthopedics;  Laterality: N/A;  . MEDIAL PARTIAL KNEE REPLACEMENT    . OOPHORECTOMY Bilateral   . SHOULDER ARTHROSCOPY WITH ROTATOR CUFF REPAIR AND SUBACROMIAL DECOMPRESSION Right 04/13/2016   Procedure: SHOULDER ARTHROSCOPY WITH ROTATOR CUFF REPAIR AND SUBACROMIAL DECOMPRESSION, release of long head biceps tendon;  Surgeon: Leanor Kail, MD;  Location: [facility] ORS;  Service: Orthopedics;  Laterality: Right;  . TOE SURGERY  2013  . TONSILLECTOMY    . WRIST FRACTURE SURGERY Bilateral     SOCIAL HISTORY:   Social History   Tobacco Use  . Smoking status: Never Smoker  . Smokeless tobacco: Never Used  Substance Use Topics  . Alcohol use: No    FAMILY HISTORY:   Family History  Problem Relation Age of Onset  . Diabetes Mother   . Emphysema Mother   . Emphysema Father   . Colon cancer Maternal Grandfather   . Breast cancer Neg Hx   . Ovarian cancer Neg Hx     DRUG ALLERGIES:   Allergies  Allergen Reactions  . Ciprofloxacin Itching and Rash  . Levofloxacin Hives  . Gabapentin     Other reaction(s): Other (See Comments) Dry mouth  . Methotrexate Derivatives     Mouth and throat ulcers  . Sulfa Antibiotics Other (See Comments)    Other reaction(s):  Other (See Comments), Unknown Other reaction(s): Other (See Comments), Unknown Per pts MD she is unable to take this medication because it is contraindicated with her other medications. Per pts MD she is unable to take this medication because it is contraindicated with her other medications. Other reaction(s): Other (See Comments), Unknown Other reaction(s): Other (See Comments), Unknown Per pts MD she is unable to take this medication because it is contraindicated with her other medications. Per pts MD she is unable to take this medication because it is contraindicated with her other medications. Other reaction(s): Other  (See Comments), Unknown Per pts MD she is unable to take this medication because it is contraindicated with her other medications. Per pts MD she is unable to take this medication because it is contraindicated with her other medications.    . Cefazolin Itching and Rash  . Cefprozil Rash  . Cephalosporins Itching and Rash  . Enbrel [Etanercept] Itching and Rash  . Humira [Adalimumab] Itching and Rash  . Hydrocodone-Acetaminophen Itching and Rash  . Levaquin [Levofloxacin In D5w] Itching and Rash  . Lorabid [Loracarbef] Itching and Rash  . Meropenem Itching and Rash  . Oxycodone Itching and Rash  . Oxycodone-Acetaminophen Itching and Rash  . Primidone Itching and Rash    REVIEW OF SYSTEMS:   Review of Systems  Constitutional: Positive for malaise/fatigue. Negative for chills and fever.  HENT: Negative.  Negative for ear discharge, ear pain, hearing loss, nosebleeds and sore throat.   Eyes: Negative.  Negative for blurred vision and pain.  Respiratory: Negative.  Negative for cough, hemoptysis, shortness of breath and wheezing.   Cardiovascular: Negative.  Negative for chest pain, palpitations and leg swelling.  Gastrointestinal: Positive for diarrhea. Negative for abdominal pain, blood in stool, nausea and vomiting.  Genitourinary: Negative.  Negative for dysuria.  Musculoskeletal: Negative for back pain.       Right knee arthritis  Skin: Negative.   Neurological: Negative for dizziness, tremors, speech change, focal weakness, seizures and headaches.  Endo/Heme/Allergies: Negative.  Does not bruise/bleed easily.  Psychiatric/Behavioral: Negative.  Negative for depression, hallucinations and suicidal ideas.    MEDICATIONS AT HOME:   Prior to Admission medications   Medication Sig Start Date End Date Taking? Authorizing Provider  aspirin EC 81 MG tablet Take 81 mg by mouth daily.    [provider]  buPROPion (WELLBUTRIN SR) 150 MG 12 hr tablet Take 150 mg by mouth 2  (two) times daily.    [provider]  Calcium-Vitamin D-Vitamin K (VIACTIV PO) Take 2 Doses by mouth every evening.    [provider]  cetirizine (ZYRTEC) 10 MG tablet Take 10 mg by mouth daily.    [provider]  chlorhexidine (PERIDEX) 0.12 % solution Use as directed 15 mLs in the mouth or throat 4 (four) times daily. 09/08/17   Salary, Avel Peace, MD  conjugated estrogens (PREMARIN) vaginal cream Place 7.82 Applicatorfuls vaginally 2 (two) times a week. 08/24/17   Defrancesco, Alanda Slim, MD  cyanocobalamin (,VITAMIN B-12,) 1000 MCG/ML injection Inject 1,000 mcg into the muscle every 30 (thirty) days.    [provider]  Cyanocobalamin (VITAMIN B-12) 6000 MCG SUBL Place 6,000 mcg under the tongue daily.    [provider]  diphenoxylate-atropine (LOMOTIL) 2.5-0.025 MG per tablet Take 2 tablets by mouth every morning.     [provider]  folic acid (FOLVITE) 1 MG tablet Take 1 mg by mouth daily.    [provider]  furosemide (LASIX)  20 MG tablet Take 20 mg by mouth every morning.     [provider]  leucovorin (WELLCOVORIN) 5 MG tablet Take 1 tablet (5 mg total) by mouth daily. 09/08/17   Salary, Avel Peace, MD  lidocaine (XYLOCAINE) 2 % solution Use as directed 15 mLs in the mouth or throat every 4 (four) hours as needed for mouth pain. 09/08/17   Salary, Holly Bodily D, MD  magnesium oxide (MAG-OX) 400 MG tablet Take 400 mg by mouth daily.     [provider]  Multiple Vitamins-Minerals (MULTIVITAMIN WITH MINERALS) tablet Take 1 tablet by mouth daily.    [provider]  omeprazole (PRILOSEC) 40 MG capsule Take 40 mg by mouth every morning.     [provider]  phenol (CHLORASEPTIC) 1.4 % LIQD Use as directed 1 spray in the mouth or throat as needed for throat irritation / pain. 09/08/17   Salary, Avel Peace, MD  potassium chloride SA (K-DUR,KLOR-CON) 20 MEQ tablet Take 20 mEq by mouth daily.     [provider]  predniSONE (DELTASONE) 50 MG tablet 1 daily 09/08/17   Salary, Avel Peace, MD  Probiotic Product (PROBIOTIC PO) Take 2 tablets by mouth every morning.    [provider]  rOPINIRole (REQUIP) 0.5 MG tablet Take 0.5 mg by mouth at bedtime as needed (for restless legs).     [provider]  sucralfate (CARAFATE) 1 g tablet Take 1 g by mouth 3 (three) times daily with meals as needed (for stomach cramping).    [provider]  Tofacitinib Citrate (XELJANZ) 5 MG TABS Take by mouth.    [provider]  traMADol (ULTRAM) 50 MG tablet Take by mouth every 6 (six) hours as needed.    [provider]  valACYclovir (VALTREX) 500 MG tablet Take 500 mg by mouth 2 (two) times daily.    [provider]      VITAL SIGNS:  Blood pressure (!) 86/54, pulse (!) 102, temperature 98.7 F (37.1 C), temperature source Oral, resp. rate 17, height 5' 1"  (1.549 m), weight 81.6 kg, SpO2 98 %.  PHYSICAL EXAMINATION:   Physical Exam Constitutional:      General: She is not in acute distress. HENT:     Head: Normocephalic.  Eyes:     General: No scleral icterus. Neck:     Musculoskeletal: Normal range of motion and neck supple.     Vascular: No JVD.     Trachea: No tracheal deviation.  Cardiovascular:     Rate and Rhythm: Normal rate and regular rhythm.     Heart sounds: Normal heart sounds. No murmur. No friction rub. No gallop.   Pulmonary:     Effort: Pulmonary effort is normal. No respiratory distress.     Breath sounds: Normal breath sounds. No wheezing or rales.  Chest:     Chest wall: No tenderness.  Abdominal:     General: Bowel sounds are normal. There is no distension.     Palpations: Abdomen is soft. There is no mass.     Tenderness: There is no abdominal tenderness. There is no guarding or rebound.  Musculoskeletal: Normal range of motion.  Skin:    General: Skin is warm.     Findings: No erythema or rash.  Neurological:      Mental Status: She is alert and oriented to person, place, and time.  Psychiatric:        Judgment: Judgment normal.  LABORATORY PANEL:   CBC Recent Labs  Lab 08/23/18 0445  WBC 13.3*  HGB 9.5*  HCT 29.3*  PLT 254   ------------------------------------------------------------------------------------------------------------------  Chemistries  Recent Labs  Lab 08/23/18 0445  NA 136  K 3.4*  CL 103  CO2 23  GLUCOSE 132*  BUN 25*  CREATININE 1.58*  CALCIUM 9.0  AST 27  ALT 20  ALKPHOS 58  BILITOT 0.6   ------------------------------------------------------------------------------------------------------------------  Cardiac Enzymes Recent Labs  Lab 08/23/18 0818  TROPONINI <0.03   ------------------------------------------------------------------------------------------------------------------  RADIOLOGY:  Ct Head Wo Contrast  Result Date: 08/23/2018 CLINICAL DATA:  Altered level of consciousness EXAM: CT HEAD WITHOUT CONTRAST TECHNIQUE: Contiguous axial images were obtained from the base of the skull through the vertex without intravenous contrast. COMPARISON:  11/29/2017 brain MRI FINDINGS: Brain: No evidence of acute infarction, hemorrhage, hydrocephalus, extra-axial collection or mass lesion/mass effect. Vascular: No hyperdense vessel or unexpected calcification. Skull: Normal. Negative for fracture or focal lesion. Sinuses/Orbits: No acute finding. IMPRESSION: Negative head CT. Electronically Signed   By: Monte Fantasia M.D.   On: 08/23/2018 06:07   Dg Hip Unilat W Or Wo Pelvis 2-3 Views Right  Result Date: 08/23/2018 CLINICAL DATA:  Right hip pain after fall. EXAM: DG HIP (WITH OR WITHOUT PELVIS) 2-3V RIGHT COMPARISON:  None. FINDINGS: There is no evidence of hip fracture or dislocation. Prior L5 vertebroplasty. IMPRESSION: Negative. Electronically Signed   By: Monte Fantasia M.D.   On: 08/23/2018 06:04    EKG:  Normal sinus rhythm heart rate 78  without ST elevation or depression  IMPRESSION AND PLAN:   68 year old female with a history of Crohn's disease who presents to the emergency room due to generalized weakness and diarrhea.  1.  Persistent hypotension from diarrhea: Patient is on chronic steroids and therefore received stress dose steroid in the emergency room.  We will continue this however at a lower dose.  She may be changed to outpatient prednisone 5 mg tomorrow if blood pressure allows. Continue IV fluids  2.  Acute kidney injury in the setting of diarrhea from taking antibiotics: Continue IV fluids Hold nephrotoxic medications  3.  Diarrhea: This is presumed to be from antibiotics. Check C. difficile and gastroenteritis panel  4.  Crohn's disease: Patient will bring in her own outpatient medication.  We will do stress dose steroids and possibly change her back to her home dose tomorrow.  5.  Generalized weakness due to diarrhea, hypotension and acute injury: PT consultation requested  6.  Mild hypokalemia: Replete 7.  Recent sinus infection: Patient has completed therapy.   All the records are reviewed and case discussed with ED provider. Management plans discussed with the patient and she is in agreement  CODE STATUS: Full  TOTAL TIME TAKING CARE OF THIS PATIENT: 50 minutes.    Margy Sumler M.D on 08/23/2018 at 10:46 AM  Between 7am to 6pm - Pager - [phone]  After 6pm go to [email or web address] - password [email or web address] [facility]  Office  [phone]  CC: Primary care physician; [name], MD   '

## 2018-08-23 NOTE — ED Notes (Signed)
Pt is incoherent and keeps asking for pain medicine and moving her right knee and leg all over the bed. Pt is pale and her lips are white. Asked pt if she possibly took too much medicine tonight. Pt denies. Pt is hypotensive and O2 sats are 88%. Pt screams when IVs are attempted. Pt does not drink alcohol. Asked pt if she took anything other than her normal meds, pt states she took some nyquil. Husband to the bedside. Husband states she took a gabapentin at 0300 because her leg was hurting her. Pt has a hx of arthritis.

## 2018-08-24 ENCOUNTER — Inpatient Hospital Stay: Payer: Medicare Other

## 2018-08-24 DIAGNOSIS — Z9049 Acquired absence of other specified parts of digestive tract: Secondary | ICD-10-CM

## 2018-08-24 DIAGNOSIS — R55 Syncope and collapse: Secondary | ICD-10-CM

## 2018-08-24 DIAGNOSIS — E860 Dehydration: Secondary | ICD-10-CM

## 2018-08-24 DIAGNOSIS — Z9889 Other specified postprocedural states: Secondary | ICD-10-CM

## 2018-08-24 DIAGNOSIS — N179 Acute kidney failure, unspecified: Principal | ICD-10-CM

## 2018-08-24 DIAGNOSIS — K5000 Crohn's disease of small intestine without complications: Secondary | ICD-10-CM

## 2018-08-24 DIAGNOSIS — R531 Weakness: Secondary | ICD-10-CM

## 2018-08-24 LAB — BASIC METABOLIC PANEL
Anion gap: 6 (ref 5–15)
BUN: 18 mg/dL (ref 8–23)
CHLORIDE: 117 mmol/L — AB (ref 98–111)
CO2: 18 mmol/L — ABNORMAL LOW (ref 22–32)
Calcium: 7.6 mg/dL — ABNORMAL LOW (ref 8.9–10.3)
Creatinine, Ser: 1.02 mg/dL — ABNORMAL HIGH (ref 0.44–1.00)
GFR calc Af Amer: >60 mL/min (ref >60)
GFR calc non Af Amer: 58 mL/min — ABNORMAL LOW (ref >60)
Glucose, Bld: 128 mg/dL — ABNORMAL HIGH (ref 70–99)
Potassium: 4 mmol/L (ref 3.5–5.1)
Sodium: 141 mmol/L (ref 135–145)

## 2018-08-24 LAB — CBC
HCT: 25.9 % — ABNORMAL LOW (ref 36.0–46.0)
Hemoglobin: 8.2 g/dL — ABNORMAL LOW (ref 12.0–15.0)
MCH: 31.2 pg (ref 26.0–34.0)
MCHC: 31.7 g/dL (ref 30.0–36.0)
MCV: 98.5 fL (ref 80.0–100.0)
Platelets: 185 10*3/uL (ref 150–400)
RBC: 2.63 MIL/uL — ABNORMAL LOW (ref 3.87–5.11)
RDW: 14.6 % (ref 11.5–15.5)
WBC: 11.8 10*3/uL — ABNORMAL HIGH (ref 4.0–10.5)
nRBC: 0 % (ref 0.0–0.2)

## 2018-08-24 LAB — FERRITIN: Ferritin: 123 ng/mL (ref 11–307)

## 2018-08-24 LAB — IRON AND TIBC
Iron: 14 ug/dL — ABNORMAL LOW (ref 28–170)
SATURATION RATIOS: 5 % — AB (ref 10.4–31.8)
TIBC: 277 ug/dL (ref 250–450)
UIBC: 263 ug/dL

## 2018-08-24 LAB — FOLATE: Folate: 55.2 ng/mL (ref >5.9)

## 2018-08-24 MED ORDER — SIMETHICONE 80 MG PO CHEW
80.0000 mg | CHEWABLE_TABLET | Freq: Two times a day (BID) | ORAL | Status: DC | PRN
  Administered 2018-08-24 – 2018-08-25 (×2): 80 mg via ORAL
  Filled 2018-08-24 (×4): qty 1

## 2018-08-24 MED ORDER — PREDNISONE 10 MG PO TABS
5.0000 mg | ORAL_TABLET | Freq: Every day | ORAL | Status: DC
  Administered 2018-08-26: 5 mg via ORAL
  Filled 2018-08-24: qty 1

## 2018-08-24 MED ORDER — PREDNISOLONE 5 MG PO TABS: 5.0000 mg | ORAL_TABLET | Freq: Every day | ORAL | Status: DC

## 2018-08-24 NOTE — Progress Notes (Signed)
During shift assessment pt complained of weakness in arm. Pt assessed pupils showed to be equal round and reactivie to light. Strength in arms and legs demonstrated generalized weakness. Pt showed symmetry in strength  Bilaterally. NIH scaled of 0. Continuing to monitor.

## 2018-08-24 NOTE — Progress Notes (Signed)
Patient complaining of gas pains, bowel sounds active, patient reported she just had a formed BM, received verbal order from Dr. Estanislado Pandy for mylicon prn twice daily.

## 2018-08-24 NOTE — Consult Note (Signed)
Cephas Darby, MD [address]  [place]  [place], [place] [address]  Main: [phone]  Fax: [phone] Pager: [phone]   Consultation  Referring Provider:     No ref. provider found Primary Care Physician:  [name], MD Primary Gastroenterologist:  Dr. Vira Agar         Reason for Consultation:     ?  Exacerbation of Crohn's  Date of Admission:  08/23/2018 Date of Consultation:  08/24/2018         HPI:   Nicole Bass is a 68 y.o. Caucasian female with history of ileocolonic Crohn's disease at age 25 status post ileocecal resection and primary anastomosis, history of rheumatoid arthritis maintained on Xeljanz, previously on methotrexate, Enbrel with recurrent flareups, chronically on prednisone, currently on prolonged prednisone taper admitted yesterday with weakness in her legs, fall.  She was recently started on antibiotics for sinus infection.  On Wednesday night, while going to the bathroom, felt weak in her legs, called her husband who caught her, also had fecal incontinence.  She was hypotensive and tachycardic per EMS.  She also has AKI with worsening anemia.  Patient was admitted for further work-up of presyncope.  She denies abdominal pain, nausea, vomiting or worsening diarrhea.  Since admission, patient was tested negative for C. Difficile.  Neurology has been consulted due to weakness in her extremities, presyncope and currently undergoing work-up.  She started on hydrocortisone 50 mg twice daily  GI is consulted for possible exacerbation of Crohn's disease. With regards to Crohn's disease, patient denies abdominal pain, nausea or vomiting.  Generally she has 2 soft, nonbloody, brown bowel movements daily.  She experiences intermittent bloating after eating certain foods.  She denies any worsening of the symptoms within last 1 to 2 weeks.  She is followed by Dr. Vira Agar for Crohn's disease.  She is not on any medications for Crohn's disease although  she was told that Morrie Sheldon might be helping her Crohn's disease.  Morrie Sheldon is currently not approved for Crohn's disease.  We use it only in ulcerative colitis.  She is currently on prednisone 4 mg as outpatient, managed by Dr. Precious Reel, her rheumatologist.  She was previously on methotrexate, this was discontinued by her rheumatologist. She had a colonoscopy by Dr. Vira Agar in 2018, report not available.  Per patient, she was told that her colon looked normal.  NSAIDs: None  Antiplts/Anticoagulants/Anti thrombotics: None  GI Procedures: Colonoscopy in 2018, report not available  Past Medical History:  Diagnosis Date  . Anemia   . Arthritis   . Collagen vascular disease ([place])   . Crohn's disease ([place])   . DDD (degenerative disc disease), cervical   . DDD (degenerative disc disease), cervical   . DDD (degenerative disc disease), cervical 2003  . DDD (degenerative disc disease), lumbar   . Degenerative disc disease, cervical   . Difficult intubation     Past Surgical History:  Procedure Laterality Date  . ABDOMINAL HYSTERECTOMY    . APPENDECTOMY    . BACK SURGERY     x 3  . [place]  . CHOLECYSTECTOMY    . COLON SURGERY  1999   removed 18 inches of colon  . DILATION AND CURETTAGE OF UTERUS  2004  . ELBOW FRACTURE SURGERY  2008  . JOINT REPLACEMENT Right 2015   partial knee replacement  . KYPHOPLASTY    . KYPHOPLASTY N/A 04/14/2015   Procedure: KYPHOPLASTY;  Surgeon:  Hessie Knows, MD;  Location: [facility] ORS;  Service: Orthopedics;  Laterality: N/A;  . KYPHOPLASTY N/A 06/04/2015   Procedure: KYPHOPLASTY L-5;  Surgeon: Hessie Knows, MD;  Location: [facility] OR[facility];  Service: Orthopedics;  Laterality: N/A;  . MEDIAL PARTIAL KNEE REPLACEMENT    . OOPHORECTOMY Bilateral   . SHOULDER ARTHROSCOPY WITH ROTATOR CUFF REPAIR AND SUBACROMIAL DECOMPRESSION Right 04/13/2016   Procedure: SHOULDER ARTHROSCOPY WITH ROTATOR CUFF REPAIR AND SUBACROMIAL DECOMPRESSION, release of long  head biceps tendon;  Surgeon: Leanor Kail, MD;  Location: [facility] OR[facility];  Service: Orthopedics;  Laterality: Right;  . TOE SURGERY  2013  . TONSILLECTOMY    . WRIST FRACTURE SURGERY Bilateral     Prior to Admission medications   Medication Sig Start Date End Date Taking? Authorizing Provider  acetaminophen (TYLENOL) 500 MG tablet Take 500 mg by mouth every 6 (six) hours as needed for mild pain or moderate pain.   Yes [provider]  alendronate (FOSAMAX) 70 MG tablet Take 70 mg by mouth once a week. Take with a full glass of water on an empty stomach.   Yes [provider]  aspirin EC 81 MG tablet Take 81 mg by mouth daily.   Yes [provider]  b complex vitamins tablet Take 1 tablet by mouth daily.   Yes [provider]  Calcium-Vitamin D-Vitamin K (VIACTIV PO) Take 2 tablets by mouth every evening.    Yes [provider]  cetirizine (ZYRTEC) 10 MG tablet Take 10 mg by mouth daily.   Yes [provider]  cholecalciferol (VITAMIN D) 25 MCG (1000 UT) tablet Take 1,000 Units by mouth daily.   Yes [provider]  conjugated estrogens (PREMARIN) vaginal cream Place 8.00 Applicatorfuls vaginally 2 (two) times a week. 08/24/17  Yes Defrancesco, Alanda Slim, MD  cyanocobalamin (,VITAMIN B-12,) 1000 MCG/ML injection Inject 1,000 mcg into the muscle every 30 (thirty) days.   Yes [provider]  diphenoxylate-atropine (LOMOTIL) 2.5-0.025 MG per tablet Take 1 tablet by mouth 4 (four) times daily as needed for diarrhea or loose stools.    Yes [provider]  furosemide (LASIX) 20 MG tablet Take 20 mg by mouth every morning.    Yes [provider]  gabapentin (NEURONTIN) 400 MG capsule Take 400 mg by mouth at bedtime.   Yes [provider]  magnesium oxide (MAG-OX) 400 MG tablet Take 400 mg by mouth daily.    Yes [provider]  methimazole (TAPAZOLE) 5 MG tablet Take 2.5 mg by mouth daily.   Yes  [provider]  Multiple Vitamins-Minerals (MULTIVITAMIN WITH MINERALS) tablet Take 1 tablet by mouth daily.   Yes [provider]  naproxen (NAPROSYN) 500 MG tablet Take 500 mg by mouth 2 (two) times daily with a meal.   Yes [provider]  omeprazole (PRILOSEC) 40 MG capsule Take 40 mg by mouth every morning.    Yes [provider]  potassium chloride SA (K-DUR,KLOR-CON) 20 MEQ tablet Take 20 mEq by mouth 2 (two) times daily.    Yes [provider]  predniSONE (DELTASONE) 1 MG tablet Take 4 mg by mouth daily.   Yes [provider]  Probiotic Product (PROBIOTIC PO) Take 1 tablet by mouth 2 (two) times daily.    Yes [provider]  rOPINIRole (REQUIP) 1 MG tablet Take 1 mg by mouth at bedtime as needed (for restless legs).    Yes [provider]  sucralfate (CARAFATE) 1 g tablet Take 1  g by mouth 3 (three) times daily with meals as needed (for stomach cramping).   Yes [provider]  telmisartan (MICARDIS) 40 MG tablet Take 40 mg by mouth daily.   Yes [provider]  Tofacitinib Citrate (XELJANZ) 5 MG TABS Take 5 mg by mouth 2 (two) times daily.    Yes [provider]  traMADol (ULTRAM) 50 MG tablet Take 50 mg by mouth every 6 (six) hours as needed for moderate pain.    Yes [provider]  chlorhexidine (PERIDEX) 0.12 % solution Use as directed 15 mLs in the mouth or throat 4 (four) times daily. 09/08/17   Salary, Avel Peace, MD  leucovorin (WELLCOVORIN) 5 MG tablet Take 1 tablet (5 mg total) by mouth daily. 09/08/17   Salary, Avel Peace, MD  lidocaine (XYLOCAINE) 2 % solution Use as directed 15 mLs in the mouth or throat every 4 (four) hours as needed for mouth pain. 09/08/17   Salary, Holly Bodily D, MD  phenol (CHLORASEPTIC) 1.4 % LIQD Use as directed 1 spray in the mouth or throat as needed for throat irritation / pain. 09/08/17   Salary, Avel Peace, MD  predniSONE (DELTASONE) 50 MG tablet 1  daily 09/08/17   Salary, Avel Peace, MD    Current Facility-Administered Medications:  .  acetaminophen (TYLENOL) tablet 650 mg, 650 mg, Oral, Q6H PRN, 650 mg at 08/24/18 1536 **OR** acetaminophen (TYLENOL) suppository 650 mg, 650 mg, Rectal, Q6H PRN, Mody, Sital, MD .  aspirin EC tablet 81 mg, 81 mg, Oral, Daily, Mody, Sital, MD, 81 mg at 08/24/18 0832 .  cholecalciferol (VITAMIN D3) tablet 1,000 Units, 1,000 Units, Oral, Daily, Bettey Costa, MD, 1,000 Units at 08/24/18 0831 .  conjugated estrogens (PREMARIN) vaginal cream 6.30 Applicatorful, 1.60 Applicatorful, Vaginal, Once per day on Mon Thu, Mody, Sital, MD .  cyanocobalamin ((VITAMIN B-12)) injection 1,000 mcg, 1,000 mcg, Intramuscular, Q30 days, Mody, Sital, MD .  gabapentin (NEURONTIN) capsule 400 mg, 400 mg, Oral, QHS, Mody, Sital, MD, 400 mg at 08/23/18 2104 .  heparin injection 5,000 Units, 5,000 Units, Subcutaneous, Q8H, Mody, Sital, MD, 5,000 Units at 08/23/18 2105 .  hydrocortisone sodium succinate (SOLU-CORTEF) 100 MG injection 50 mg, 50 mg, Intravenous, Q12H, Mody, Sital, MD, 50 mg at 08/24/18 1808 .  ketorolac (TORADOL) 15 MG/ML injection 15 mg, 15 mg, Intravenous, Q6H PRN, Benjie Karvonen, Sital, MD, 15 mg at 08/23/18 1844 .  leucovorin (WELLCOVORIN) tablet 5 mg, 5 mg, Oral, Daily, Mody, Sital, MD, 5 mg at 08/24/18 0832 .  loratadine (CLARITIN) tablet 10 mg, 10 mg, Oral, Daily, Mody, Sital, MD, 10 mg at 08/24/18 0831 .  methimazole (TAPAZOLE) tablet 2.5 mg, 2.5 mg, Oral, Daily, Mody, Sital, MD, 2.5 mg at 08/24/18 0832 .  multivitamin (RENA-VIT) tablet 1 tablet, 1 tablet, Oral, Daily, Mody, Sital, MD, 1 tablet at 08/24/18 0831 .  multivitamin with minerals tablet 1 tablet, 1 tablet, Oral, Daily, Mody, Sital, MD, 1 tablet at 08/24/18 0832 .  ondansetron (ZOFRAN) tablet 4 mg, 4 mg, Oral, Q6H PRN **OR** ondansetron (ZOFRAN) injection 4 mg, 4 mg, Intravenous, Q6H PRN, Mody, Sital, MD .  pantoprazole (PROTONIX) EC tablet 40 mg, 40 mg, Oral, Daily,  Mody, Sital, MD, 40 mg at 08/24/18 0831 .  rOPINIRole (REQUIP) tablet 1 mg, 1 mg, Oral, QHS PRN, Mody, Sital, MD .  senna-docusate (Senokot-S) tablet 1 tablet, 1 tablet, Oral, QHS PRN, Mody, Sital, MD .  sucralfate (CARAFATE) tablet 1 g, 1 g, Oral, TID WC PRN, Bettey Costa, MD .  Tofacitinib Citrate TABS 5 mg, 5 mg, Oral, BID, Mody, Sital, MD  Family History  Problem Relation Age of Onset  . Diabetes Mother   . Emphysema Mother   . Emphysema Father   . Colon cancer Maternal Grandfather   . Breast cancer Neg Hx   . Ovarian cancer Neg Hx      Social History   Tobacco Use  . Smoking status: Never Smoker  . Smokeless tobacco: Never Used  Substance Use Topics  . Alcohol use: No  . Drug use: No    Allergies as of 08/23/2018 - Review Complete 08/23/2018  Allergen Reaction Noted  . Ciprofloxacin Itching and Rash 08/31/2016  . Levofloxacin Hives 01/08/2014  . Gabapentin  05/24/2017  . Methotrexate derivatives  11/21/2017  . Sulfa antibiotics Other (See Comments) 07/16/2013  . Cefazolin Itching and Rash 04/13/2015  . Cefprozil Rash 07/16/2013  . Cephalosporins Itching and Rash 04/13/2015  . Enbrel [etanercept] Itching and Rash 04/13/2015  . Humira [adalimumab] Itching and Rash 08/04/2015  . Hydrocodone-acetaminophen Itching and Rash 04/29/2015  . Levaquin [levofloxacin in d5w] Itching and Rash 04/13/2015  . Lorabid [loracarbef] Itching and Rash 04/13/2015  . Meropenem Itching and Rash 04/13/2015  . Oxycodone Itching and Rash 09/06/2017  . Oxycodone-acetaminophen Itching and Rash 09/06/2017  . Primidone Itching and Rash 04/13/2015    Review of Systems:    All systems reviewed and negative except where noted in HPI.   Physical Exam:  Vital signs in last 24 hours: Temp:  [99.1 F (37.3 C)-100 F (37.8 C)] 99.1 F (37.3 C) (01/10 1923) Pulse Rate:  [86-107] 98 (01/10 1928) Resp:  [16-22] 21 (01/10 1923) BP: (102-120)/(51-70) 120/63 (01/10 1928) SpO2:  [95 %-96 %] 95 %  (01/10 1923) Last BM Date: 08/24/17 General:   Pleasant, cooperative in NAD Head:  Normocephalic and atraumatic. Eyes:   No icterus.   Conjunctiva pink. PERRLA. Ears:  Normal auditory acuity. Neck:  Supple; no masses or thyroidomegaly Lungs: Respirations even and unlabored. Lungs clear to auscultation bilaterally.   No wheezes, crackles, or rhonchi.  Heart:  Regular rate and rhythm;  Without murmur, clicks, rubs or gallops Abdomen:  Soft, obese, midline vertical scar from prior surgery, nondistended, nontender. Normal bowel sounds. No appreciable masses or hepatomegaly.  No rebound or guarding.  Rectal:  Not performed. Msk:  Symmetrical without gross deformities.  Strength normal Extremities:  Without edema, cyanosis or clubbing. Neurologic:  Alert and oriented x3;  grossly normal neurologically. Skin:  Intact without significant lesions or rashes. Psych:  Alert and cooperative. Normal affect.  LAB RESULTS: CBC Latest Ref Rng & Units 08/24/2018 08/23/2018 08/23/2018  WBC 4.0 - 10.5 K/uL 11.8(H) 13.0(H) 13.3(H)  Hemoglobin 12.0 - 15.0 g/dL 8.2(L) 8.8(L) 9.5(L)  Hematocrit 36.0 - 46.0 % 25.9(L) 27.7(L) 29.3(L)  Platelets 150 - 400 K/uL 185 194 254    BMET BMP Latest Ref Rng & Units 08/24/2018 08/23/2018 08/23/2018  Glucose 70 - 99 mg/dL 128(H) - 132(H)  BUN 8 - 23 mg/dL 18 - 25(H)  Creatinine 0.44 - 1.00 mg/dL 1.02(H) 1.21(H) 1.58(H)  Sodium 135 - 145 mmol/L 141 - 136  Potassium 3.5 - 5.1 mmol/L 4.0 - 3.4(L)  Chloride 98 - 111 mmol/L 117(H) - 103  CO2 22 - 32 mmol/L 18(L) - 23  Calcium 8.9 - 10.3 mg/dL 7.6(L) - 9.0    LFT Hepatic Function Latest Ref Rng & Units 08/23/2018 09/06/2017 09/15/2016  Total Protein 6.5 - 8.1 g/dL 6.6 7.5 7.6  Albumin 3.5 -  5.0 g/dL 3.4(L) 3.5 3.7  AST 15 - 41 U/L 27 64(H) 38  ALT 0 - 44 U/L 20 43 15  Alk Phosphatase 38 - 126 U/L 58 124 77  Total Bilirubin 0.3 - 1.2 mg/dL 0.6 1.4(H) 1.0     STUDIES: Ct Head Wo Contrast  Result Date: 08/23/2018 CLINICAL DATA:   Altered level of consciousness EXAM: CT HEAD WITHOUT CONTRAST TECHNIQUE: Contiguous axial images were obtained from the base of the skull through the vertex without intravenous contrast. COMPARISON:  11/29/2017 brain MRI FINDINGS: Brain: No evidence of acute infarction, hemorrhage, hydrocephalus, extra-axial collection or mass lesion/mass effect. Vascular: No hyperdense vessel or unexpected calcification. Skull: Normal. Negative for fracture or focal lesion. Sinuses/Orbits: No acute finding. IMPRESSION: Negative head CT. Electronically Signed   By: Monte Fantasia M.D.   On: 08/23/2018 06:07   Mr Brain Wo Contrast  Result Date: 08/24/2018 CLINICAL DATA:  Syncope.  Left arm and right leg weakness. EXAM: MRI HEAD WITHOUT CONTRAST TECHNIQUE: Multiplanar, multiecho pulse sequences of the brain and surrounding structures were obtained without intravenous contrast. COMPARISON:  Head CT 08/23/2018 and MRI 11/29/2017 FINDINGS: Brain: There is no evidence of acute infarct, intracranial hemorrhage, mass, midline shift, or extra-axial fluid collection. The ventricles and sulci are within normal limits for age. Patchy T2 hyperintensities in the cerebral white matter bilaterally have mildly progressed from the prior MRI and are nonspecific but compatible with mild chronic small vessel ischemic disease. 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 Vascular: Major intracranial vascular flow voids are preserved. Skull and upper cervical spine: Unremarkable bone marrow signal. Sinuses/Orbits: Unremarkable orbits. Clear paranasal sinuses. Trace left mastoid effusion. Other: None. IMPRESSION: 1. No acute intracranial abnormality. 2. Mild chronic small vessel ischemic disease, mildly progressed from 11/2017. Electronically Signed   By: Logan Bores M.D.   On: 08/24/2018 16:30   Dg Hip Unilat W Or Wo Pelvis 2-3 Views Right  Result Date: 08/23/2018 CLINICAL DATA:  Right hip pain after fall. EXAM:  DG HIP (WITH OR WITHOUT PELVIS) 2-3V RIGHT COMPARISON:  None. FINDINGS: There is no evidence of hip fracture or dislocation. Prior L5 vertebroplasty. IMPRESSION: Negative. Electronically Signed   By: Monte Fantasia M.D.   On: 08/23/2018 06:04      Impression / Plan:   Nicole Bass is a 68 y.o. pleasant Caucasian female with history of Crohn's disease, diagnosed at age 44, status post ileal cecal resection at the time of diagnosis, has been in clinical remission since then.  She is currently not on any medications for Crohn's disease per se.  She has history of rheumatoid arthritis for which she is taking Xeljanz and low-dose prednisone.  She has history of osteoporosis secondary to chronic prednisone use.  Probably, her neurologic symptoms and weakness might be related to secondary adrenal insufficiency or prednisone-induced neuropathy and myopathy.  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  Recommend checking ferritin, B12 and folate levels and replete if needed.  I do not suspect exacerbation of Crohn's disease at this time.  Patient is currently receiving hydrocortisone, unclear if this is for Crohn's exacerbation or receiving as a stress dose steroid in setting of acute illness.  Do not recommend any steroids for Crohn's disease at this time  She can follow-up with Dr. Vira Agar as outpatient  Thank you for involving me in the care of this patient.  Dr. Alice Reichert to cover from tomorrow if  needed    LOS: 1 day   Sherri Sear, MD  08/24/2018, 7:41 PM   Note: This dictation was prepared with Dragon dictation along with smaller phrase technology. Any transcriptional errors that result from this process are unintentional.

## 2018-08-24 NOTE — Progress Notes (Signed)
[facility] [place] NAME: Nicole Bass    MR#:  [id number]  DATE OF BIRTH:  Dec 26, 1950  SUBJECTIVE:  CHIEF COMPLAINT:   Chief Complaint  Patient presents with  . Weakness   Came after an episode of syncope, which occurred after diarrhea episodes. Have some renal failure, which improved after fluids. She had left upper limb weakness since last midnight.  REVIEW OF SYSTEMS:   CONSTITUTIONAL: No fever, fatigue or weakness.  EYES: No blurred or double vision.  EARS, NOSE, AND THROAT: No tinnitus or ear pain.  RESPIRATORY: No cough, shortness of breath, wheezing or hemoptysis.  CARDIOVASCULAR: No chest pain, orthopnea, edema.  GASTROINTESTINAL: No nausea, vomiting, diarrhea or abdominal pain.  GENITOURINARY: No dysuria, hematuria.  ENDOCRINE: No polyuria, nocturia,  HEMATOLOGY: No anemia, easy bruising or bleeding SKIN: No rash or lesion. MUSCULOSKELETAL: No joint pain or arthritis.   NEUROLOGIC: No tingling, numbness,left upper limb weakness.  PSYCHIATRY: No anxiety or depression.   ROS  DRUG ALLERGIES:   Allergies  Allergen Reactions  . Ciprofloxacin Itching and Rash  . Levofloxacin Hives  . Gabapentin     Other reaction(s): Other (See Comments) Dry mouth  . Methotrexate Derivatives     Mouth and throat ulcers  . Sulfa Antibiotics Other (See Comments)    Other reaction(s): Other (See Comments), Unknown Other reaction(s): Other (See Comments), Unknown Per pts MD she is unable to take this medication because it is contraindicated with her other medications. Per pts MD she is unable to take this medication because it is contraindicated with her other medications. Other reaction(s): Other (See Comments), Unknown Other reaction(s): Other (See Comments), Unknown Per pts MD she is unable to take this medication because it is contraindicated with her other medications. Per pts MD she is unable to take this medication because  it is contraindicated with her other medications. Other reaction(s): Other (See Comments), Unknown Per pts MD she is unable to take this medication because it is contraindicated with her other medications. Per pts MD she is unable to take this medication because it is contraindicated with her other medications.    . Cefazolin Itching and Rash  . Cefprozil Rash  . Cephalosporins Itching and Rash  . Enbrel [Etanercept] Itching and Rash  . Humira [Adalimumab] Itching and Rash  . Hydrocodone-Acetaminophen Itching and Rash  . Levaquin [Levofloxacin In D5w] Itching and Rash  . Lorabid [Loracarbef] Itching and Rash  . Meropenem Itching and Rash  . Oxycodone Itching and Rash  . Oxycodone-Acetaminophen Itching and Rash  . Primidone Itching and Rash    VITALS:  Blood pressure 120/63, pulse 98, temperature 99.1 F (37.3 C), temperature source Oral, resp. rate (!) 21, height 5' 1"  (1.549 m), weight 89.4 kg, SpO2 95 %.  PHYSICAL EXAMINATION:  GENERAL:  68 y.o.-year-old patient lying in the bed with no acute distress.  EYES: Pupils equal, round, reactive to light and accommodation. No scleral icterus. Extraocular muscles intact.  HEENT: Head atraumatic, normocephalic. Oropharynx and nasopharynx clear.  NECK:  Supple, no jugular venous distention. No thyroid enlargement, no tenderness.  LUNGS: Normal breath sounds bilaterally, no wheezing, rales,rhonchi or crepitation. No use of accessory muscles of respiration.  CARDIOVASCULAR: S1, S2 normal. No murmurs, rubs, or gallops.  ABDOMEN: Soft, nontender, nondistended. Bowel sounds present. No organomegaly or mass.  EXTREMITIES: No pedal edema, cyanosis, or clubbing.  NEUROLOGIC: Cranial nerves II through XII are intact. Muscle strength 5/5 in all extremities except left upper  limb 3/5. Sensation intact. Gait not checked.  PSYCHIATRIC: The patient is alert and oriented x 3.  SKIN: No obvious rash, lesion, or ulcer.   Physical Exam LABORATORY  PANEL:   CBC Recent Labs  Lab 08/24/18 0533  WBC 11.8*  HGB 8.2*  HCT 25.9*  PLT 185   ------------------------------------------------------------------------------------------------------------------  Chemistries  Recent Labs  Lab 08/23/18 0445  08/24/18 0533  NA 136  --  141  K 3.4*  --  4.0  CL 103  --  117*  CO2 23  --  18*  GLUCOSE 132*  --  128*  BUN 25*  --  18  CREATININE 1.58*   < > 1.02*  CALCIUM 9.0  --  7.6*  AST 27  --   --   ALT 20  --   --   ALKPHOS 58  --   --   BILITOT 0.6  --   --    < > = values in this interval not displayed.   ------------------------------------------------------------------------------------------------------------------  Cardiac Enzymes Recent Labs  Lab 08/23/18 0445 08/23/18 0818  TROPONINI <0.03 <0.03   ------------------------------------------------------------------------------------------------------------------  RADIOLOGY:  Ct Head Wo Contrast  Result Date: 08/23/2018 CLINICAL DATA:  Altered level of consciousness EXAM: CT HEAD WITHOUT CONTRAST TECHNIQUE: Contiguous axial images were obtained from the base of the skull through the vertex without intravenous contrast. COMPARISON:  11/29/2017 brain MRI FINDINGS: Brain: No evidence of acute infarction, hemorrhage, hydrocephalus, extra-axial collection or mass lesion/mass effect. Vascular: No hyperdense vessel or unexpected calcification. Skull: Normal. Negative for fracture or focal lesion. Sinuses/Orbits: No acute finding. IMPRESSION: Negative head CT. Electronically Signed   By: Monte Fantasia M.D.   On: 08/23/2018 06:07   Mr Brain Wo Contrast  Result Date: 08/24/2018 CLINICAL DATA:  Syncope.  Left arm and right leg weakness. EXAM: MRI HEAD WITHOUT CONTRAST TECHNIQUE: Multiplanar, multiecho pulse sequences of the brain and surrounding structures were obtained without intravenous contrast. COMPARISON:  Head CT 08/23/2018 and MRI 11/29/2017 FINDINGS: Brain: There is no  evidence of acute infarct, intracranial hemorrhage, mass, midline shift, or extra-axial fluid collection. The ventricles and sulci are within normal limits for age. Patchy T2 hyperintensities in the cerebral white matter bilaterally have mildly progressed from the prior MRI and are nonspecific but compatible with mild chronic small vessel ischemic disease. 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 Vascular: Major intracranial vascular flow voids are preserved. Skull and upper cervical spine: Unremarkable bone marrow signal. Sinuses/Orbits: Unremarkable orbits. Clear paranasal sinuses. Trace left mastoid effusion. Other: None. IMPRESSION: 1. No acute intracranial abnormality. 2. Mild chronic small vessel ischemic disease, mildly progressed from 11/2017. Electronically Signed   By: Logan Bores M.D.   On: 08/24/2018 16:30   Dg Hip Unilat W Or Wo Pelvis 2-3 Views Right  Result Date: 08/23/2018 CLINICAL DATA:  Right hip pain after fall. EXAM: DG HIP (WITH OR WITHOUT PELVIS) 2-3V RIGHT COMPARISON:  None. FINDINGS: There is no evidence of hip fracture or dislocation. Prior L5 vertebroplasty. IMPRESSION: Negative. Electronically Signed   By: Monte Fantasia M.D.   On: 08/23/2018 06:04    ASSESSMENT AND PLAN:   Active Problems:   Acute kidney injury [facility])  68 year old female with a history of Crohn's disease who presents to the emergency room due to generalized weakness and diarrhea.  *  Persistent hypotension from diarrhea: Patient is on chronic steroids and therefore received stress dose steroid in the emergency room.  We will continue this however  at a lower dose.  She may be changed to outpatient prednisone 5 mg Continue IV fluids  * LUL weakness   Neuro consult, MRI brain.  *  Acute kidney injury in the setting of diarrhea from taking antibiotics: Continue IV fluids Hold nephrotoxic medications  *  Diarrhea: This is presumed to be from antibiotics. Checked C.  difficile and gastroenteritis panel- negative. She have Crohn's Dz- called GI consult.   *  Generalized weakness due to diarrhea, hypotension and acute injury: PT consultation requested  *  Mild hypokalemia: Replete *  Recent sinus infection: Patient has completed therapy.   All the records are reviewed and case discussed with Care Management/Social Workerr. Management plans discussed with the patient, family and they are in agreement.  CODE STATUS: Full.  TOTAL TIME TAKING CARE OF THIS PATIENT: 35 minutes.    POSSIBLE D/C IN 1-2 DAYS, DEPENDING ON CLINICAL CONDITION.   Vaughan Basta M.D on 08/24/2018   Between 7am to 6pm - Pager - [phone]  After 6pm go to [email or web address] - password [email or web address] [facility]  Office  [phone]  CC: Primary care physician; [name], MD  Note: This dictation was prepared with Dragon dictation along with smaller phrase technology. Any transcriptional errors that result from this process are unintentional.

## 2018-08-24 NOTE — Consult Note (Addendum)
Reason for Consult: Left arm and right leg weakness, syncope and collapse Referring Physician: Vaughan Basta MD  CC: Left arm and right leg weakness, syncope and collapse  HPI: Nicole Bass is an 68 y.o. female with multiple medical issues including hypertension, hypothyroidism, peripheral polyneuropathy, lumbar radiculopathy, RLS, tremor, chronic low back pain, pyelonephritis, recurrent UTI, Crohn's disease of colon without complication, type 2 diabetes mellitus with diabetic neuropathy, B12 deficiency, and rheumatoid arthritis, sclerosing mesenteric fibrosis presenting to the ED on 08/23/2018 with chief complaints of acute onset of diarrhea, generalized weakness and syncopal episode.  Patient reports that she was going to the bathroom around 11:49 PM last night suddenly felt dizzy and passed out losing control of her bowel and bladder.  Patient reports that her legs just give out on her causing her to fall without hitting her head.  She currently endorses left arm and right leg weakness. Denies associated symptoms preceding the syncope of aura, nausea and vomiting, feeling cold or clammy, visual auras or blurry vision, palpitations shortness of breath chest pain. Denies associated altered sensorium, speech abnormality, cranial nerve deficit, seizures, focal sensory deficits, diplopia, numbness or tingling, involuntary movements, tremor.  Patient was noted to be hypotensive with a blood pressure of 80/40 by EMS.  On arrival to the ED she was resuscitated with 2 L IV normal saline with improvement of blood pressure and clinical status noted.  There was concerns for possible C. difficile since she has been on antibiotics for sinus infection therefore C. difficile cultures were obtained but was negative so far.  Initial labs revealed negative troponin, potassium 3.4, glucose 132, BUN 25, creatinine 1.58, decreased kidney functions from baseline, hemoglobin 9.5, hematocrit 29.3 elevated white count of  13.3.  Initial CT head was negative for acute intracranial abnormality.  Patient was therefore admitted for further work-up and management.  Past Medical History:  Diagnosis Date  . Anemia   . Arthritis   . Collagen vascular disease ([place])   . Crohn's disease ([place])   . DDD (degenerative disc disease), cervical   . DDD (degenerative disc disease), cervical   . DDD (degenerative disc disease), cervical 2003  . DDD (degenerative disc disease), lumbar   . Degenerative disc disease, cervical   . Difficult intubation     Past Surgical History:  Procedure Laterality Date  . ABDOMINAL HYSTERECTOMY    . APPENDECTOMY    . BACK SURGERY     x 3  . [place]  . CHOLECYSTECTOMY    . COLON SURGERY  1999   removed 18 inches of colon  . DILATION AND CURETTAGE OF UTERUS  2004  . ELBOW FRACTURE SURGERY  2008  . JOINT REPLACEMENT Right 2015   partial knee replacement  . KYPHOPLASTY    . KYPHOPLASTY N/A 04/14/2015   Procedure: KYPHOPLASTY;  Surgeon: Hessie Knows, MD;  Location: [facility] ORS;  Service: Orthopedics;  Laterality: N/A;  . KYPHOPLASTY N/A 06/04/2015   Procedure: KYPHOPLASTY L-5;  Surgeon: Hessie Knows, MD;  Location: [facility] ORS;  Service: Orthopedics;  Laterality: N/A;  . MEDIAL PARTIAL KNEE REPLACEMENT    . OOPHORECTOMY Bilateral   . SHOULDER ARTHROSCOPY WITH ROTATOR CUFF REPAIR AND SUBACROMIAL DECOMPRESSION Right 04/13/2016   Procedure: SHOULDER ARTHROSCOPY WITH ROTATOR CUFF REPAIR AND SUBACROMIAL DECOMPRESSION, release of long head biceps tendon;  Surgeon: Leanor Kail, MD;  Location: [facility] ORS;  Service: Orthopedics;  Laterality: Right;  . TOE SURGERY  2013  . TONSILLECTOMY    . WRIST  FRACTURE SURGERY Bilateral     Family History  Problem Relation Age of Onset  . Diabetes Mother   . Emphysema Mother   . Emphysema Father   . Colon cancer Maternal Grandfather   . Breast cancer Neg Hx   . Ovarian cancer Neg Hx     Social History:  reports that she has never  smoked. She has never used smokeless tobacco. She reports that she does not drink alcohol or use drugs.  Allergies  Allergen Reactions  . Ciprofloxacin Itching and Rash  . Levofloxacin Hives  . Gabapentin     Other reaction(s): Other (See Comments) Dry mouth  . Methotrexate Derivatives     Mouth and throat ulcers  . Sulfa Antibiotics Other (See Comments)    Other reaction(s): Other (See Comments), Unknown Other reaction(s): Other (See Comments), Unknown Per pts MD she is unable to take this medication because it is contraindicated with her other medications. Per pts MD she is unable to take this medication because it is contraindicated with her other medications. Other reaction(s): Other (See Comments), Unknown Other reaction(s): Other (See Comments), Unknown Per pts MD she is unable to take this medication because it is contraindicated with her other medications. Per pts MD she is unable to take this medication because it is contraindicated with her other medications. Other reaction(s): Other (See Comments), Unknown Per pts MD she is unable to take this medication because it is contraindicated with her other medications. Per pts MD she is unable to take this medication because it is contraindicated with her other medications.    . Cefazolin Itching and Rash  . Cefprozil Rash  . Cephalosporins Itching and Rash  . Enbrel [Etanercept] Itching and Rash  . Humira [Adalimumab] Itching and Rash  . Hydrocodone-Acetaminophen Itching and Rash  . Levaquin [Levofloxacin In D5w] Itching and Rash  . Lorabid [Loracarbef] Itching and Rash  . Meropenem Itching and Rash  . Oxycodone Itching and Rash  . Oxycodone-Acetaminophen Itching and Rash  . Primidone Itching and Rash    Medications:  I have reviewed the patient's current medications. Prior to Admission:  Medications Prior to Admission  Medication Sig Dispense Refill Last Dose  . acetaminophen (TYLENOL) 500 MG tablet Take 500  mg by mouth every 6 (six) hours as needed for mild pain or moderate pain.   Unknown at PRN  . alendronate (FOSAMAX) 70 MG tablet Take 70 mg by mouth once a week. Take with a full glass of water on an empty stomach.   Past Week at Unknown time  . aspirin EC 81 MG tablet Take 81 mg by mouth daily.   08/22/2018 at 0800  . b complex vitamins tablet Take 1 tablet by mouth daily.   08/22/2018 at 0800  . Calcium-Vitamin D-Vitamin K (VIACTIV PO) Take 2 tablets by mouth every evening.    08/22/2018 at 1700  . cetirizine (ZYRTEC) 10 MG tablet Take 10 mg by mouth daily.   08/22/2018 at 0800  . cholecalciferol (VITAMIN D) 25 MCG (1000 UT) tablet Take 1,000 Units by mouth daily.   08/22/2018 at 0800  . conjugated estrogens (PREMARIN) vaginal cream Place 0.17 Applicatorfuls vaginally 2 (two) times a week. 42.5 g 6 As directed at As directed  . cyanocobalamin (,VITAMIN B-12,) 1000 MCG/ML injection Inject 1,000 mcg into the muscle every 30 (thirty) days.   As directed at As directed  . diphenoxylate-atropine (LOMOTIL) 2.5-0.025 MG per tablet Take 1 tablet by mouth 4 (four) times daily as needed  for diarrhea or loose stools.    Unknown at PRN  . furosemide (LASIX) 20 MG tablet Take 20 mg by mouth every morning.    08/22/2018 at 0800  . gabapentin (NEURONTIN) 400 MG capsule Take 400 mg by mouth at bedtime.   08/22/2018 at 2000  . magnesium oxide (MAG-OX) 400 MG tablet Take 400 mg by mouth daily.    08/22/2018 at 0800  . methimazole (TAPAZOLE) 5 MG tablet Take 2.5 mg by mouth daily.   08/22/2018 at 0800  . Multiple Vitamins-Minerals (MULTIVITAMIN WITH MINERALS) tablet Take 1 tablet by mouth daily.   08/22/2018 at 0800  . naproxen (NAPROSYN) 500 MG tablet Take 500 mg by mouth 2 (two) times daily with a meal.   08/22/2018 at 1700  . omeprazole (PRILOSEC) 40 MG capsule Take 40 mg by mouth every morning.    08/22/2018 at 0800  . potassium chloride SA (K-DUR,KLOR-CON) 20 MEQ tablet Take 20 mEq by mouth 2 (two) times daily.    08/22/2018 at 1700  .  predniSONE (DELTASONE) 1 MG tablet Take 4 mg by mouth daily.   08/22/2018 at 0800  . Probiotic Product (PROBIOTIC PO) Take 1 tablet by mouth 2 (two) times daily.    08/22/2018 at 1700  . rOPINIRole (REQUIP) 1 MG tablet Take 1 mg by mouth at bedtime as needed (for restless legs).    Unknown at PRN  . sucralfate (CARAFATE) 1 g tablet Take 1 g by mouth 3 (three) times daily with meals as needed (for stomach cramping).   Unknown at PRN  . telmisartan (MICARDIS) 40 MG tablet Take 40 mg by mouth daily.   08/22/2018 at 0800  . Tofacitinib Citrate (XELJANZ) 5 MG TABS Take 5 mg by mouth 2 (two) times daily.    08/22/2018 at 1700  . traMADol (ULTRAM) 50 MG tablet Take 50 mg by mouth every 6 (six) hours as needed for moderate pain.    Unknown at PRN  . chlorhexidine (PERIDEX) 0.12 % solution Use as directed 15 mLs in the mouth or throat 4 (four) times daily. 120 mL 0 Taking  . leucovorin (WELLCOVORIN) 5 MG tablet Take 1 tablet (5 mg total) by mouth daily. 1 tablet 0 Taking  . lidocaine (XYLOCAINE) 2 % solution Use as directed 15 mLs in the mouth or throat every 4 (four) hours as needed for mouth pain. 100 mL 0 Taking  . phenol (CHLORASEPTIC) 1.4 % LIQD Use as directed 1 spray in the mouth or throat as needed for throat irritation / pain. 1 Bottle 0 Taking  . predniSONE (DELTASONE) 50 MG tablet 1 daily 7 tablet 0 Taking   Scheduled: . aspirin EC  81 mg Oral Daily  . cholecalciferol  1,000 Units Oral Daily  . conjugated estrogens  3.41 Applicatorful Vaginal Once per day on Mon Thu  . cyanocobalamin  1,000 mcg Intramuscular Q30 days  . gabapentin  400 mg Oral QHS  . heparin  5,000 Units Subcutaneous Q8H  . hydrocortisone sod succinate (SOLU-CORTEF) inj  50 mg Intravenous Q12H  . leucovorin  5 mg Oral Daily  . loratadine  10 mg Oral Daily  . methimazole  2.5 mg Oral Daily  . multivitamin  1 tablet Oral Daily  . multivitamin with minerals  1 tablet Oral Daily  . pantoprazole  40 mg Oral Daily  . Tofacitinib  Citrate  5 mg Oral BID    ROS: History obtained from the patient   General ROS: negative for - chills, fatigue,  fever, night sweats, weight gain or weight loss Psychological ROS: negative for - behavioral disorder, hallucinations, memory difficulties, mood swings or suicidal ideation Ophthalmic ROS: negative for - blurry vision, double vision, eye pain or loss of vision ENT ROS: negative for - epistaxis, nasal discharge, oral lesions, sore throat, tinnitus or vertigo Allergy and Immunology ROS: negative for - hives or itchy/watery eyes Hematological and Lymphatic ROS: negative for - bleeding problems, bruising or swollen lymph nodes Endocrine ROS: negative for - galactorrhea, hair pattern changes, polydipsia/polyuria or temperature intolerance Respiratory ROS: negative for - cough, hemoptysis, shortness of breath or wheezing Cardiovascular ROS: negative for - chest pain, dyspnea on exertion, edema or irregular heartbeat Gastrointestinal ROS: negative for - abdominal pain, diarrhea, hematemesis, nausea/vomiting or stool incontinence Genito-Urinary ROS: negative for - dysuria, hematuria, incontinence or urinary frequency/urgency Musculoskeletal ROS: negative for - joint swelling or muscular weakness Neurological ROS: as noted in HPI Dermatological ROS: negative for rash and skin lesion changes  Physical Examination: Blood pressure 113/70, pulse 92, temperature 99.1 F (37.3 C), temperature source Oral, resp. rate 20, height 5' 1"  (1.549 m), weight 89.4 kg, SpO2 96 %.  HEENT-  Normocephalic, no lesions, without obvious abnormality.  Normal external eye and conjunctiva.  Normal TM's bilaterally.  Normal auditory canals and external ears. Normal external nose, mucus membranes and septum.  Normal pharynx. Cardiovascular- S1, S2 normal, pulses palpable throughout   Lungs- chest clear, no wheezing, rales, normal symmetric air entry Abdomen- soft, non-tender; bowel sounds normal; no masses,  no  organomegaly Extremities- no edema Lymph-no adenopathy palpable Musculoskeletal-no joint tenderness, deformity or swelling Skin-warm and dry, no hyperpigmentation, vitiligo, or suspicious lesions  Neurological Exam   Mental Status: Alert, oriented, thought content appropriate.  Speech fluent without evidence of aphasia.  Able to follow 3 step commands without difficulty. Attention span and concentration seemed appropriate  Cranial Nerves: II: Discs flat bilaterally; Visual fields grossly normal, pupils equal, round, reactive to light and accommodation III,IV, VI: ptosis not present, extra-ocular motions intact bilaterally V,VII: smile symmetric, facial light touch sensation intact VIII: hearing normal bilaterally IX,X: gag reflex present XI: bilateral shoulder shrug XII: midline tongue extension Motor: Right :  Upper extremity   5/5 Without pronator drift      Left: Upper extremity   4/5 without pronator drift Right:   Lower extremity   4+/5                                          Left: Lower extremity   5/5 Tone and bulk:normal tone throughout; no atrophy noted Sensory: Pinprick and light touch intact bilaterally Deep Tendon Reflexes: 2+ and symmetric throughout Plantars: Right: mute                              Left: mute Cerebellar: Finger-to-nose testing intact bilaterally. Heel to shin testing normal bilaterally Gait: not tested due to safety concerns  Data Reviewed  Laboratory Studies:   Basic Metabolic Panel: Recent Labs  Lab 08/23/18 0445 08/23/18 1346 08/24/18 0533  NA 136  --  141  K 3.4*  --  4.0  CL 103  --  117*  CO2 23  --  18*  GLUCOSE 132*  --  128*  BUN 25*  --  18  CREATININE 1.58* 1.21* 1.02*  CALCIUM 9.0  --  7.6*  Liver Function Tests: Recent Labs  Lab 08/23/18 0445  AST 27  ALT 20  ALKPHOS 58  BILITOT 0.6  PROT 6.6  ALBUMIN 3.4*   No results for input(s): LIPASE, AMYLASE in the last 168 hours. No results for input(s): AMMONIA in  the last 168 hours.  CBC: Recent Labs  Lab 08/23/18 0445 08/23/18 1346 08/24/18 0533  WBC 13.3* 13.0* 11.8*  HGB 9.5* 8.8* 8.2*  HCT 29.3* 27.7* 25.9*  MCV 95.1 97.5 98.5  PLT 254 194 185    Cardiac Enzymes: Recent Labs  Lab 08/23/18 0445 08/23/18 0818  TROPONINI <0.03 <0.03    BNP: Invalid input(s): POCBNP  CBG: Recent Labs  Lab 08/23/18 0505 08/23/18 0910  GLUCAP 102* 97    Microbiology: Results for orders placed or performed during the hospital encounter of 08/23/18  Gastrointestinal Panel by PCR , Stool     Status: None   Collection Time: 08/23/18  4:14 AM  Result Value Ref Range Status   Campylobacter species NOT DETECTED NOT DETECTED Final   Plesimonas shigelloides NOT DETECTED NOT DETECTED Final   Salmonella species NOT DETECTED NOT DETECTED Final   Yersinia enterocolitica NOT DETECTED NOT DETECTED Final   Vibrio species NOT DETECTED NOT DETECTED Final   Vibrio cholerae NOT DETECTED NOT DETECTED Final   Enteroaggregative E coli (EAEC) NOT DETECTED NOT DETECTED Final   Enteropathogenic E coli (EPEC) NOT DETECTED NOT DETECTED Final   Enterotoxigenic E coli (ETEC) NOT DETECTED NOT DETECTED Final   Shiga like toxin producing E coli (STEC) NOT DETECTED NOT DETECTED Final   Shigella/Enteroinvasive E coli (EIEC) NOT DETECTED NOT DETECTED Final   Cryptosporidium NOT DETECTED NOT DETECTED Final   Cyclospora cayetanensis NOT DETECTED NOT DETECTED Final   Entamoeba histolytica NOT DETECTED NOT DETECTED Final   Giardia lamblia NOT DETECTED NOT DETECTED Final   Adenovirus F40/41 NOT DETECTED NOT DETECTED Final   Astrovirus NOT DETECTED NOT DETECTED Final   Norovirus GI/GII NOT DETECTED NOT DETECTED Final   Rotavirus A NOT DETECTED NOT DETECTED Final   Sapovirus (I, II, IV, and V) NOT DETECTED NOT DETECTED Final    Comment: Performed at [facility], [address] [place], [place] [address]  C difficile quick scan w PCR reflex     Status: None    Collection Time: 08/23/18  4:14 AM  Result Value Ref Range Status   C Diff antigen NEGATIVE NEGATIVE Final   C Diff toxin NEGATIVE NEGATIVE Final   C Diff interpretation No C. difficile detected.  Final    Comment: Performed at [facility], [address] [place], [place] [address]    Coagulation Studies: No results for input(s): LABPROT, INR in the last 72 hours.  Urinalysis: No results for input(s): COLORURINE, LABSPEC, PHURINE, GLUCOSEU, HGBUR, BILIRUBINUR, KETONESUR, PROTEINUR, UROBILINOGEN, NITRITE, LEUKOCYTESUR in the last 168 hours.  Invalid input(s): APPERANCEUR  Lipid Panel:  No results found for: CHOL, TRIG, HDL, CHOLHDL, VLDL, LDLCALC  HgbA1C: No results found for: HGBA1C  Urine Drug Screen:  No results found for: LABOPIA, COCAINSCRNUR, LABBENZ, AMPHETMU, THCU, LABBARB  Alcohol Level: No results for input(s): ETH in the last 168 hours.  Other results: EKG: normal EKG, normal sinus rhythm, unchanged from previous tracings. Vent. rate 78 BPM PR interval * ms QRS duration 80 ms QT/QTc 364/415 ms P-R-T axes 15 99 89  Imaging: Ct Head Wo Contrast  Result Date: 08/23/2018 CLINICAL DATA:  Altered level of consciousness EXAM: CT HEAD WITHOUT CONTRAST TECHNIQUE: Contiguous  axial images were obtained from the base of the skull through the vertex without intravenous contrast. COMPARISON:  11/29/2017 brain MRI FINDINGS: Brain: No evidence of acute infarction, hemorrhage, hydrocephalus, extra-axial collection or mass lesion/mass effect. Vascular: No hyperdense vessel or unexpected calcification. Skull: Normal. Negative for fracture or focal lesion. Sinuses/Orbits: No acute finding. IMPRESSION: Negative head CT. Electronically Signed   By: Monte Fantasia M.D.   On: 08/23/2018 06:07   Dg Hip Unilat W Or Wo Pelvis 2-3 Views Right  Result Date: 08/23/2018 CLINICAL DATA:  Right hip pain after fall. EXAM: DG HIP (WITH OR WITHOUT PELVIS) 2-3V RIGHT COMPARISON:  None. FINDINGS:  There is no evidence of hip fracture or dislocation. Prior L5 vertebroplasty. IMPRESSION: Negative. Electronically Signed   By: Monte Fantasia M.D.   On: 08/23/2018 06:04   Patient seen and examined.  Clinical course and management discussed.  Necessary edits performed.  I agree with the above.  Assessment and plan of care developed and discussed below.    Assessment: 68 y.o female with multiple medical issues including hypertension, hypothyroidism, peripheral polyneuropathy, lumbar radiculopathy, RLS, tremor, chronic low back pain, pyelonephritis, recurrent UTI, Crohn's disease of colon without complication, type 2 diabetes mellitus with diabetic neuropathy, B12 deficiency, and rheumatoid arthritis, sclerosing mesenteric fibrosis presenting to the ED on 08/23/2018 with chief complaints of acute onset of diarrhea, generalized weakness and syncopal episode, left arm and right leg weakness. Etiology unclear.  Patient presented with fever and elevated white blood cell count.  Patient on both prednisone and Leucovorin.  Differential includes embolic shower of emboli possibly related to infective endocarditis, watershed infarcts and seizure. CT head reviewed and shows no acute intracranial abnormality. Initial labs revealed negative troponin, potassium 3.4, glucose 132, BUN 25, creatinine 1.58, decreased kidney functions from baseline, hemoglobin 9.5, hematocrit 29.3 elevated white count of 13.3. Further work up recommended.  Plan 1. MRI brain without contrast 2. Echocardiogram 3. EEG 4. Orthostatic vitals   This patient was staffed with Dr. Magda Paganini, Doy Mince who personally evaluated patient, reviewed documentation and agreed with assessment and plan of care as above.  Rufina Falco, DNP, FNP-BC Board certified Nurse Practitioner Neurology Department  08/24/2018, 1:39 PM   Alexis Goodell, MD Neurology [phone]  08/24/2018  2:19 PM

## 2018-08-24 NOTE — Care Management Note (Signed)
Case Management Note  Patient Details  Name: BOWIE DOIRON MRN: [id number] Date of Birth: 12/23/1950  Subjective/Objective: Admitted to [facility] with the diagnosis of acute kidney injury. Lives with husband, Ronalee Belts [phone]). Next appointment scheduled with Dr. Doy Hutching in February, Prtescriptions are filled at [facility] in [place].  Home Health in the past. Doesn't remember name of agency. No skilled facility. No home oxygen. Rolling walker and cane in the home. Takes care of all basic activities of daily living herself, drives. [place] prior to this admission. Good appetite. Husband will transport                   Action/Plan: Physical therapy is recommending home health/physical therapy.[place] query completed. One copy placed on chart. One copy given to Ms. Ganim.  Undecided about agency at this time. Will continue to follow for transition of care.   Expected Discharge Date:                  Expected Discharge Plan:     In-House Referral:   yes  Discharge planning Services   yes  Post Acute Care Choice:   yes Choice offered to:   patient  DME Arranged:    DME Agency:     HH Arranged:    HH Agency:     Status of Service:     If discussed at [employer] of Stay Meetings, dates discussed:    Additional Comments:  Shelbie Ammons, RN MSN CCM Care Management [phone] 08/24/2018, 1:13 PM

## 2018-08-24 NOTE — Progress Notes (Signed)
PT Cancellation Note  Patient Details Name: SIAH KANNAN MRN: [id number] DOB: 10-01-1950   Cancelled Treatment:    Reason Eval/Treat Not Completed: Patient at procedure or test/unavailable Pt out of room for MRI (new onset weakness), will try back at later date when pt is available and appropriate.  Kreg Shropshire, DPT 08/24/2018, 4:14 PM

## 2018-08-24 NOTE — Plan of Care (Signed)
  Problem: Health Behavior/Discharge Planning: Goal: Ability to manage health-related needs will improve Outcome: Progressing   Problem: Clinical Measurements: Goal: Ability to maintain clinical measurements within normal limits will improve Outcome: Progressing Goal: Will remain free from infection Outcome: Progressing Goal: Diagnostic test results will improve Outcome: Progressing   Problem: Activity: Goal: Risk for activity intolerance will decrease Outcome: Progressing   Problem: Coping: Goal: Level of anxiety will decrease Outcome: Progressing   Problem: Pain Managment: Goal: General experience of comfort will improve Outcome: Progressing   Problem: Safety: Goal: Ability to remain free from injury will improve Outcome: Progressing

## 2018-08-25 ENCOUNTER — Inpatient Hospital Stay
Admit: 2018-08-25 | Discharge: 2018-08-25 | Disposition: A | Discharge status: Home / Self Care | Payer: Medicare Other | Attending: Nurse Practitioner | Admitting: Nurse Practitioner

## 2018-08-25 LAB — VITAMIN B12: Vitamin B-12: 362 pg/mL (ref 180–914)

## 2018-08-25 MED ORDER — RIFAXIMIN 550 MG PO TABS
550.0000 mg | ORAL_TABLET | Freq: Two times a day (BID) | ORAL | Status: DC
  Administered 2018-08-25 – 2018-08-26 (×3): 550 mg via ORAL
  Filled 2018-08-25 (×3): qty 1

## 2018-08-25 MED ORDER — KETOROLAC TROMETHAMINE 15 MG/ML IJ SOLN
7.5000 mg | Freq: Once | INTRAMUSCULAR | Status: AC
  Administered 2018-08-25: 7.5 mg via INTRAVENOUS
  Filled 2018-08-25: qty 1

## 2018-08-25 MED ORDER — FUROSEMIDE 20 MG PO TABS
20.0000 mg | ORAL_TABLET | Freq: Every day | ORAL | Status: DC
  Administered 2018-08-25 – 2018-08-26 (×2): 20 mg via ORAL
  Filled 2018-08-25 (×3): qty 1

## 2018-08-25 MED ORDER — DOCUSATE SODIUM 100 MG PO CAPS
100.0000 mg | ORAL_CAPSULE | Freq: Two times a day (BID) | ORAL | Status: DC
  Administered 2018-08-25 – 2018-08-26 (×3): 100 mg via ORAL
  Filled 2018-08-25 (×3): qty 1

## 2018-08-25 NOTE — Progress Notes (Signed)
[place] [facility] [place] NAME: Nicole Bass    MR#:  [id number]  DATE OF BIRTH:  1950/12/18  SUBJECTIVE:  CHIEF COMPLAINT:   Chief Complaint  Patient presents with  . Weakness   Came after an episode of syncope, which occurred after diarrhea episodes. Have some renal failure, which improved after fluids. She had left upper limb weakness which is slightly better now.  REVIEW OF SYSTEMS:   CONSTITUTIONAL: No fever, fatigue or weakness.  EYES: No blurred or double vision.  EARS, NOSE, AND THROAT: No tinnitus or ear pain.  RESPIRATORY: No cough, shortness of breath, wheezing or hemoptysis.  CARDIOVASCULAR: No chest pain, orthopnea, edema.  GASTROINTESTINAL: No nausea, vomiting, diarrhea or abdominal pain.  GENITOURINARY: No dysuria, hematuria.  ENDOCRINE: No polyuria, nocturia,  HEMATOLOGY: No anemia, easy bruising or bleeding SKIN: No rash or lesion. MUSCULOSKELETAL: No joint pain or arthritis.   NEUROLOGIC: No tingling, numbness,left upper limb weakness.  PSYCHIATRY: No anxiety or depression.   ROS  DRUG ALLERGIES:   Allergies  Allergen Reactions  . Ciprofloxacin Itching and Rash  . Levofloxacin Hives  . Gabapentin     Other reaction(s): Other (See Comments) Dry mouth  . Methotrexate Derivatives     Mouth and throat ulcers  . Sulfa Antibiotics Other (See Comments)    Other reaction(s): Other (See Comments), Unknown Other reaction(s): Other (See Comments), Unknown Per pts MD she is unable to take this medication because it is contraindicated with her other medications. Per pts MD she is unable to take this medication because it is contraindicated with her other medications. Other reaction(s): Other (See Comments), Unknown Other reaction(s): Other (See Comments), Unknown Per pts MD she is unable to take this medication because it is contraindicated with her other medications. Per pts MD she is unable to take this medication  because it is contraindicated with her other medications. Other reaction(s): Other (See Comments), Unknown Per pts MD she is unable to take this medication because it is contraindicated with her other medications. Per pts MD she is unable to take this medication because it is contraindicated with her other medications.    . Cefazolin Itching and Rash  . Cefprozil Rash  . Cephalosporins Itching and Rash  . Enbrel [Etanercept] Itching and Rash  . Humira [Adalimumab] Itching and Rash  . Hydrocodone-Acetaminophen Itching and Rash  . Levaquin [Levofloxacin In D5w] Itching and Rash  . Lorabid [Loracarbef] Itching and Rash  . Meropenem Itching and Rash  . Oxycodone Itching and Rash  . Oxycodone-Acetaminophen Itching and Rash  . Primidone Itching and Rash    VITALS:  Blood pressure 129/69, pulse 83, temperature 98.2 F (36.8 C), temperature source Oral, resp. rate 18, height 5' 1"  (1.549 m), weight 89.4 kg, SpO2 99 %.  PHYSICAL EXAMINATION:  GENERAL:  68 y.o.-year-old patient lying in the bed with no acute distress.  EYES: Pupils equal, round, reactive to light and accommodation. No scleral icterus. Extraocular muscles intact.  HEENT: Head atraumatic, normocephalic. Oropharynx and nasopharynx clear.  NECK:  Supple, no jugular venous distention. No thyroid enlargement, no tenderness.  LUNGS: Normal breath sounds bilaterally, no wheezing, rales,rhonchi or crepitation. No use of accessory muscles of respiration.  CARDIOVASCULAR: S1, S2 normal. No murmurs, rubs, or gallops.  ABDOMEN: Soft, nontender, nondistended. Bowel sounds present. No organomegaly or mass.  EXTREMITIES: No pedal edema, cyanosis, or clubbing.  NEUROLOGIC: Cranial nerves II through XII are intact. Muscle strength 5/5 in all extremities except left  upper limb 3-4/5. Sensation intact. Gait not checked.  PSYCHIATRIC: The patient is alert and oriented x 3.  SKIN: No obvious rash, lesion, or ulcer.   Physical Exam LABORATORY  PANEL:   CBC Recent Labs  Lab 08/24/18 0533  WBC 11.8*  HGB 8.2*  HCT 25.9*  PLT 185   ------------------------------------------------------------------------------------------------------------------  Chemistries  Recent Labs  Lab 08/23/18 0445  08/24/18 0533  NA 136  --  141  K 3.4*  --  4.0  CL 103  --  117*  CO2 23  --  18*  GLUCOSE 132*  --  128*  BUN 25*  --  18  CREATININE 1.58*   < > 1.02*  CALCIUM 9.0  --  7.6*  AST 27  --   --   ALT 20  --   --   ALKPHOS 58  --   --   BILITOT 0.6  --   --    < > = values in this interval not displayed.   ------------------------------------------------------------------------------------------------------------------  Cardiac Enzymes Recent Labs  Lab 08/23/18 0445 08/23/18 0818  TROPONINI <0.03 <0.03   ------------------------------------------------------------------------------------------------------------------  RADIOLOGY:  Mr Brain Wo Contrast  Result Date: 08/24/2018 CLINICAL DATA:  Syncope.  Left arm and right leg weakness. EXAM: MRI HEAD WITHOUT CONTRAST TECHNIQUE: Multiplanar, multiecho pulse sequences of the brain and surrounding structures were obtained without intravenous contrast. COMPARISON:  Head CT 08/23/2018 and MRI 11/29/2017 FINDINGS: Brain: There is no evidence of acute infarct, intracranial hemorrhage, mass, midline shift, or extra-axial fluid collection. The ventricles and sulci are within normal limits for age. Patchy T2 hyperintensities in the cerebral white matter bilaterally have mildly progressed from the prior MRI and are nonspecific but compatible with mild chronic small vessel ischemic disease. 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 Vascular: Major intracranial vascular flow voids are preserved. Skull and upper cervical spine: Unremarkable bone marrow signal. Sinuses/Orbits: Unremarkable orbits. Clear paranasal sinuses. Trace left mastoid effusion. Other:  None. IMPRESSION: 1. No acute intracranial abnormality. 2. Mild chronic small vessel ischemic disease, mildly progressed from 11/2017. Electronically Signed   By: Logan Bores M.D.   On: 08/24/2018 16:30    ASSESSMENT AND PLAN:   Active Problems:   Acute kidney injury [facility])  68 year old female with a history of Crohn's disease who presents to the emergency room due to generalized weakness and diarrhea.  *  Persistent hypotension from diarrhea: Patient is on chronic steroids and therefore received stress dose steroid in the emergency room.  We will continue this however at a lower dose.  She may be changed to outpatient prednisone 5 mg Continue IV fluids  * LUL weakness   Neuro consult, MRI brain. MRI is negative.  Likely neuropathy.  *  Acute kidney injury in the setting of diarrhea from taking antibiotics: Continue IV fluids Hold nephrotoxic medications  *  Diarrhea: This is presumed to be from antibiotics. Checked C. difficile and gastroenteritis panel- negative. She have Crohn's Dz- called GI consult. Suggested to start on rifaximin.  No need for steroids for Crohn's at this time.  *  Generalized weakness due to diarrhea, hypotension and acute injury: PT consultation requested  *  Mild hypokalemia: Replete *  Recent sinus infection: Patient has completed therapy.  *Rheumatoid arthritis Continue low-dose oral steroids and follow with rheumatologist as outpatient.  All the records are reviewed and case discussed with Care Management/Social Workerr. Management plans discussed with the patient, family and they are in agreement.  CODE STATUS: Full.  TOTAL TIME TAKING CARE OF THIS PATIENT: 35 minutes.    POSSIBLE D/C IN 1-2 DAYS, DEPENDING ON CLINICAL CONDITION.   Vaughan Basta M.D on 08/25/2018   Between 7am to 6pm - Pager - [phone]  After 6pm go to [email or web address] - password [email or web address] [facility]  Office  [phone]  CC: Primary  care physician; [name], MD  Note: This dictation was prepared with Dragon dictation along with smaller phrase technology. Any transcriptional errors that result from this process are unintentional.

## 2018-08-25 NOTE — Progress Notes (Signed)
*  PRELIMINARY RESULTS* Echocardiogram 2D Echocardiogram has been performed. A Bubble Study was requested & performed on this exam.  Nicole Bass 08/25/2018, 2:42 PM

## 2018-08-25 NOTE — Progress Notes (Signed)
Subjective: Patient reports that she is stronger today.    Objective: Current vital signs: BP 118/70 (BP Location: Left Arm)   Pulse 78   Temp 98.1 F (36.7 C) (Oral)   Resp 20   Ht 5' 1"  (1.549 m)   Wt 89.4 kg   SpO2 97%   BMI 37.24 kg/m  Vital signs in last 24 hours: Temp:  [98.1 F (36.7 C)-99.3 F (37.4 C)] 98.1 F (36.7 C) (01/11 0359) Pulse Rate:  [78-98] 78 (01/11 0359) Resp:  [16-21] 20 (01/11 0359) BP: (102-120)/(62-70) 118/70 (01/11 0359) SpO2:  [95 %-97 %] 97 % (01/11 0359)  Intake/Output from previous day: No intake/output data recorded. Intake/Output this shift: No intake/output data recorded. Nutritional status:  Diet Order            Diet regular Room service appropriate? Yes; Fluid consistency: Thin  Diet effective now              Neurologic Exam: Mental Status: Alert, oriented, thought content appropriate. Speech fluent without evidence of aphasia. Able to follow 3 step commands without difficulty. Attention span and concentration seemed appropriate  Cranial Nerves: II: Discs flat bilaterally; Visual fields grossly normal, pupils equal, round, reactive to light and accommodation III,IV, VI: ptosis not present, extra-ocular motions intact bilaterally V,VII: smile symmetric, facial light touch sensationintact VIII: hearing normal bilaterally IX,X: gag reflex present XI: bilateral shoulder shrug XII: midline tongue extension Motor: Lifts all extremities strongly against gravity.  No drift noted   Lab Results: Basic Metabolic Panel: Recent Labs  Lab 08/23/18 0445 08/23/18 1346 08/24/18 0533  NA 136  --  141  K 3.4*  --  4.0  CL 103  --  117*  CO2 23  --  18*  GLUCOSE 132*  --  128*  BUN 25*  --  18  CREATININE 1.58* 1.21* 1.02*  CALCIUM 9.0  --  7.6*    Liver Function Tests: Recent Labs  Lab 08/23/18 0445  AST 27  ALT 20  ALKPHOS 58  BILITOT 0.6  PROT 6.6  ALBUMIN 3.4*   No results for input(s): LIPASE, AMYLASE in the  last 168 hours. No results for input(s): AMMONIA in the last 168 hours.  CBC: Recent Labs  Lab 08/23/18 0445 08/23/18 1346 08/24/18 0533  WBC 13.3* 13.0* 11.8*  HGB 9.5* 8.8* 8.2*  HCT 29.3* 27.7* 25.9*  MCV 95.1 97.5 98.5  PLT 254 194 185    Cardiac Enzymes: Recent Labs  Lab 08/23/18 0445 08/23/18 0818  TROPONINI <0.03 <0.03    Lipid Panel: No results for input(s): CHOL, TRIG, HDL, CHOLHDL, VLDL, LDLCALC in the last 168 hours.  CBG: Recent Labs  Lab 08/23/18 0505 08/23/18 0910  GLUCAP 102* 97    Microbiology: Results for orders placed or performed during the hospital encounter of 08/23/18  Gastrointestinal Panel by PCR , Stool     Status: None   Collection Time: 08/23/18  4:14 AM  Result Value Ref Range Status   Campylobacter species NOT DETECTED NOT DETECTED Final   Plesimonas shigelloides NOT DETECTED NOT DETECTED Final   Salmonella species NOT DETECTED NOT DETECTED Final   Yersinia enterocolitica NOT DETECTED NOT DETECTED Final   Vibrio species NOT DETECTED NOT DETECTED Final   Vibrio cholerae NOT DETECTED NOT DETECTED Final   Enteroaggregative E coli (EAEC) NOT DETECTED NOT DETECTED Final   Enteropathogenic E coli (EPEC) NOT DETECTED NOT DETECTED Final   Enterotoxigenic E coli (ETEC) NOT DETECTED NOT DETECTED Final  Shiga like toxin producing E coli (STEC) NOT DETECTED NOT DETECTED Final   Shigella/Enteroinvasive E coli (EIEC) NOT DETECTED NOT DETECTED Final   Cryptosporidium NOT DETECTED NOT DETECTED Final   Cyclospora cayetanensis NOT DETECTED NOT DETECTED Final   Entamoeba histolytica NOT DETECTED NOT DETECTED Final   Giardia lamblia NOT DETECTED NOT DETECTED Final   Adenovirus F40/41 NOT DETECTED NOT DETECTED Final   Astrovirus NOT DETECTED NOT DETECTED Final   Norovirus GI/GII NOT DETECTED NOT DETECTED Final   Rotavirus A NOT DETECTED NOT DETECTED Final   Sapovirus (I, II, IV, and V) NOT DETECTED NOT DETECTED Final    Comment: Performed at  [facility], [address] [place], [place] [address]  C difficile quick scan w PCR reflex     Status: None   Collection Time: 08/23/18  4:14 AM  Result Value Ref Range Status   C Diff antigen NEGATIVE NEGATIVE Final   C Diff toxin NEGATIVE NEGATIVE Final   C Diff interpretation No C. difficile detected.  Final    Comment: Performed at [facility], [address] [place], [place] [address]    Coagulation Studies: No results for input(s): LABPROT, INR in the last 72 hours.  Imaging: Mr Brain Wo Contrast  Result Date: 08/24/2018 CLINICAL DATA:  Syncope.  Left arm and right leg weakness. EXAM: MRI HEAD WITHOUT CONTRAST TECHNIQUE: Multiplanar, multiecho pulse sequences of the brain and surrounding structures were obtained without intravenous contrast. COMPARISON:  Head CT 08/23/2018 and MRI 11/29/2017 FINDINGS: Brain: There is no evidence of acute infarct, intracranial hemorrhage, mass, midline shift, or extra-axial fluid collection. The ventricles and sulci are within normal limits for age. Patchy T2 hyperintensities in the cerebral white matter bilaterally have mildly progressed from the prior MRI and are nonspecific but compatible with mild chronic small vessel ischemic disease. 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 Vascular: Major intracranial vascular flow voids are preserved. Skull and upper cervical spine: Unremarkable bone marrow signal. Sinuses/Orbits: Unremarkable orbits. Clear paranasal sinuses. Trace left mastoid effusion. Other: None. IMPRESSION: 1. No acute intracranial abnormality. 2. Mild chronic small vessel ischemic disease, mildly progressed from 11/2017. Electronically Signed   By: Logan Bores M.D.   On: 08/24/2018 16:30    Medications:  I have reviewed the patient's current medications. Scheduled: . aspirin EC  81 mg Oral Daily  . cholecalciferol  1,000 Units Oral Daily  . conjugated estrogens  0.93  Applicatorful Vaginal Once per day on Mon Thu  . cyanocobalamin  1,000 mcg Intramuscular Q30 days  . docusate sodium  100 mg Oral BID  . furosemide  20 mg Oral Daily  . gabapentin  400 mg Oral QHS  . heparin  5,000 Units Subcutaneous Q8H  . leucovorin  5 mg Oral Daily  . loratadine  10 mg Oral Daily  . methimazole  2.5 mg Oral Daily  . multivitamin  1 tablet Oral Daily  . multivitamin with minerals  1 tablet Oral Daily  . pantoprazole  40 mg Oral Daily  . predniSONE  5 mg Oral Q breakfast  . rifaximin  550 mg Oral BID  . Tofacitinib Citrate  5 mg Oral BID    Assessment/Plan: Patient with improved weakness today.  MRI of the brain reviewed and shows no acute changes.  Patient not orthostatic.  EEG unable to be performed.  With improvement in weakness despite continuation of steroid, doubt a steroid myopathy.   No further inpatient neurologic intervention  is recommended at this time.  If further questions arise, please call or page at that time.  Thank you for allowing neurology to participate in the care of this patient.  Patient to follow up with her neurologist, Dr. Melrose Nakayama, on an outpatient basis.  Alexis Goodell, MD Neurology [phone] 08/25/2018  12:57 PM     LOS: 2 days   Alexis Goodell, MD Neurology [phone] 08/25/2018  12:25 PM

## 2018-08-26 MED ORDER — FERROUS SULFATE 325 (65 FE) MG PO TABS
325.0000 mg | ORAL_TABLET | Freq: Two times a day (BID) | ORAL | Status: DC
  Administered 2018-08-26: 10:00:00 325 mg via ORAL
  Filled 2018-08-26: qty 1

## 2018-08-26 MED ORDER — PREDNISONE 20 MG PO TABS: 40.0000 mg | ORAL_TABLET | Freq: Every day | ORAL | 0 refills | Status: AC

## 2018-08-26 MED ORDER — FERROUS SULFATE 325 (65 FE) MG PO TABS: 325.0000 mg | ORAL_TABLET | Freq: Two times a day (BID) | ORAL | 0 refills | Status: DC

## 2018-08-26 MED ORDER — RIFAXIMIN 550 MG PO TABS: 550.0000 mg | ORAL_TABLET | Freq: Two times a day (BID) | ORAL | 0 refills | Status: AC

## 2018-08-26 MED ORDER — TOFACITINIB CITRATE 5 MG PO TABS: 5.0000 mg | ORAL_TABLET | Freq: Every day | ORAL | Status: DC

## 2018-08-26 NOTE — Discharge Summary (Signed)
[facility] [place] NAME: Nicole Bass    MR#:  [id number]  DATE OF BIRTH:  1951/07/02  DATE OF ADMISSION:  08/23/2018 ADMITTING PHYSICIAN: Bettey Costa, MD  DATE OF DISCHARGE: 08/26/2018   PRIMARY CARE PHYSICIAN: [name], MD    ADMISSION DIAGNOSIS:  Diarrhea of presumed infectious origin [R19.7] Dehydration [E86.0] Weakness [R53.1]  DISCHARGE DIAGNOSIS:  Active Problems:   Acute kidney injury ([place])   SECONDARY DIAGNOSIS:   Past Medical History:  Diagnosis Date  . Anemia   . Arthritis   . Collagen vascular disease ([place])   . Crohn's disease ([place])   . DDD (degenerative disc disease), cervical   . DDD (degenerative disc disease), cervical   . DDD (degenerative disc disease), cervical 2003  . DDD (degenerative disc disease), lumbar   . Degenerative disc disease, cervical   . Difficult intubation     HOSPITAL COURSE:   68 year old female with a history of Crohn's disease who presents to the emergency room due to generalized weakness and diarrhea.  * Persistent hypotension from diarrhea:  Patient was given stress dose steroid for 1 day and IV fluids but it helped to restore the blood pressure so stable now.  * LUL weakness   Neuro consult, MRI brain. MRI is negative.  Likely neuropathy. No further work-up from neurological point of view.  * Acute kidney injury in the setting of diarrhea from taking antibiotics: Continue IV fluids Hold nephrotoxic medications-back to baseline.  * Diarrhea: This is presumed to be from antibiotics. Checked C. difficile and gastroenteritis panel- negative. She have Crohn's Dz- called GI consult. Suggested to start on rifaximin.  No need for steroids for Crohn's at this time. Much improved.  Will give 7 days prescription and advised to follow with GI clinic.  *RA flare up She had flareup in her right thumb, as per her usually she go to her rheumatologist office and  receives intra-articular injections. I will give higher dose steroids for 5 days and advised to continue taking pain medications and call her rheumatologist office to make an appointment.  * Generalized weakness due to diarrhea, hypotension and acute kidney injury: PT consultation requested Suggested home health PT but patient was able to walk without any difficulty so decided to go home.  * Mild hypokalemia: Replete * Recent sinus infection: Patient has completed therapy.  *Rheumatoid arthritis Continue low-dose oral steroids and follow with rheumatologist as outpatient.  *Anemia Will start on iron treatment and advised to continue as outpatient.  DISCHARGE CONDITIONS:   Stable  CONSULTS OBTAINED:  Treatment Team:  Catarina Hartshorn, MD Lin Landsman, MD  DRUG ALLERGIES:   Allergies  Allergen Reactions  . Ciprofloxacin Itching and Rash  . Levofloxacin Hives  . Gabapentin     Other reaction(s): Other (See Comments) Dry mouth  . Methotrexate Derivatives     Mouth and throat ulcers  . Sulfa Antibiotics Other (See Comments)    Other reaction(s): Other (See Comments), Unknown Other reaction(s): Other (See Comments), Unknown Per pts MD she is unable to take this medication because it is contraindicated with her other medications. Per pts MD she is unable to take this medication because it is contraindicated with her other medications. Other reaction(s): Other (See Comments), Unknown Other reaction(s): Other (See Comments), Unknown Per pts MD she is unable to take this medication because it is contraindicated with her other medications. Per pts MD she is unable to take this medication because  it is contraindicated with her other medications. Other reaction(s): Other (See Comments), Unknown Per pts MD she is unable to take this medication because it is contraindicated with her other medications. Per pts MD she is unable to take this medication because it  is contraindicated with her other medications.    . Cefazolin Itching and Rash  . Cefprozil Rash  . Cephalosporins Itching and Rash  . Enbrel [Etanercept] Itching and Rash  . Humira [Adalimumab] Itching and Rash  . Hydrocodone-Acetaminophen Itching and Rash  . Levaquin [Levofloxacin In D5w] Itching and Rash  . Lorabid [Loracarbef] Itching and Rash  . Meropenem Itching and Rash  . Oxycodone Itching and Rash  . Oxycodone-Acetaminophen Itching and Rash  . Primidone Itching and Rash    DISCHARGE MEDICATIONS:   Allergies as of 08/26/2018      Reactions   Ciprofloxacin Itching, Rash   Levofloxacin Hives   Gabapentin    Other reaction(s): Other (See Comments) Dry mouth   Methotrexate Derivatives    Mouth and throat ulcers   Sulfa Antibiotics Other (See Comments)   Other reaction(s): Other (See Comments), Unknown Other reaction(s): Other (See Comments), Unknown Per pts MD she is unable to take this medication because it is contraindicated with her other medications. Per pts MD she is unable to take this medication because it is contraindicated with her other medications. Other reaction(s): Other (See Comments), Unknown Other reaction(s): Other (See Comments), Unknown Per pts MD she is unable to take this medication because it is contraindicated with her other medications. Per pts MD she is unable to take this medication because it is contraindicated with her other medications. Other reaction(s): Other (See Comments), Unknown Per pts MD she is unable to take this medication because it is contraindicated with her other medications. Per pts MD she is unable to take this medication because it is contraindicated with her other medications.     Cefazolin Itching, Rash   Cefprozil Rash   Cephalosporins Itching, Rash   Enbrel [etanercept] Itching, Rash   Humira [adalimumab] Itching, Rash   Hydrocodone-acetaminophen Itching, Rash   Levaquin [levofloxacin In D5w] Itching, Rash    Lorabid [loracarbef] Itching, Rash   Meropenem Itching, Rash   Oxycodone Itching, Rash   Oxycodone-acetaminophen Itching, Rash   Primidone Itching, Rash      Medication List    STOP taking these medications   telmisartan 40 MG tablet Commonly known as:  MICARDIS     TAKE these medications   acetaminophen 500 MG tablet Commonly known as:  TYLENOL Take 500 mg by mouth every 6 (six) hours as needed for mild pain or moderate pain.   alendronate 70 MG tablet Commonly known as:  FOSAMAX Take 70 mg by mouth once a week. Take with a full glass of water on an empty stomach.   aspirin EC 81 MG tablet Take 81 mg by mouth daily.   b complex vitamins tablet Take 1 tablet by mouth daily.   cetirizine 10 MG tablet Commonly known as:  ZYRTEC Take 10 mg by mouth daily.   chlorhexidine 0.12 % solution Commonly known as:  PERIDEX Use as directed 15 mLs in the mouth or throat 4 (four) times daily.   cholecalciferol 25 MCG (1000 UT) tablet Commonly known as:  VITAMIN D Take 1,000 Units by mouth daily.   conjugated estrogens vaginal cream Commonly known as:  PREMARIN Place 3.87 Applicatorfuls vaginally 2 (two) times a week.   cyanocobalamin 1000 MCG/ML injection Commonly known as:  (  VITAMIN B-12) Inject 1,000 mcg into the muscle every 30 (thirty) days.   diphenoxylate-atropine 2.5-0.025 MG tablet Commonly known as:  LOMOTIL Take 1 tablet by mouth 4 (four) times daily as needed for diarrhea or loose stools.   ferrous sulfate 325 (65 FE) MG tablet Take 1 tablet (325 mg total) by mouth 2 (two) times daily with a meal.   furosemide 20 MG tablet Commonly known as:  LASIX Take 20 mg by mouth every morning.   gabapentin 400 MG capsule Commonly known as:  NEURONTIN Take 400 mg by mouth at bedtime.   leucovorin 5 MG tablet Commonly known as:  WELLCOVORIN Take 1 tablet (5 mg total) by mouth daily.   lidocaine 2 % solution Commonly known as:  XYLOCAINE Use as directed 15 mLs in  the mouth or throat every 4 (four) hours as needed for mouth pain.   magnesium oxide 400 MG tablet Commonly known as:  MAG-OX Take 400 mg by mouth daily.   methimazole 5 MG tablet Commonly known as:  TAPAZOLE Take 2.5 mg by mouth daily.   multivitamin with minerals tablet Take 1 tablet by mouth daily.   naproxen 500 MG tablet Commonly known as:  NAPROSYN Take 500 mg by mouth 2 (two) times daily with a meal.   omeprazole 40 MG capsule Commonly known as:  PRILOSEC Take 40 mg by mouth every morning.   phenol 1.4 % Liqd Commonly known as:  CHLORASEPTIC Use as directed 1 spray in the mouth or throat as needed for throat irritation / pain.   potassium chloride SA 20 MEQ tablet Commonly known as:  K-DUR,KLOR-CON Take 20 mEq by mouth 2 (two) times daily.   predniSONE 20 MG tablet Commonly known as:  DELTASONE Take 2 tablets (40 mg total) by mouth daily with breakfast for 7 days. What changed:   medication strength how much to take how to take this when to take this additional instructions Another medication with the same name was removed. Continue taking this medication, and follow the directions you see here.   PROBIOTIC PO Take 1 tablet by mouth 2 (two) times daily.   rifaximin 550 MG Tabs tablet Commonly known as:  XIFAXAN Take 1 tablet (550 mg total) by mouth 2 (two) times daily for 5 days.   rOPINIRole 1 MG tablet Commonly known as:  REQUIP Take 1 mg by mouth at bedtime as needed (for restless legs).   sucralfate 1 g tablet Commonly known as:  CARAFATE Take 1 g by mouth 3 (three) times daily with meals as needed (for stomach cramping).   traMADol 50 MG tablet Commonly known as:  ULTRAM Take 50 mg by mouth every 6 (six) hours as needed for moderate pain.   VIACTIV PO Take 2 tablets by mouth every evening.   XELJANZ 5 MG Tabs Generic drug:  Tofacitinib Citrate Take 5 mg by mouth 2 (two) times daily.        DISCHARGE INSTRUCTIONS:    Follow with GI  and Rheumatology clinic in the next 1 to 2 weeks.  If you experience worsening of your admission symptoms, develop shortness of breath, life threatening emergency, suicidal or homicidal thoughts you must seek medical attention immediately by calling 911 or calling your MD immediately  if symptoms less severe.  You Must read complete instructions/literature along with all the possible adverse reactions/side effects for all the Medicines you take and that have been prescribed to you. Take any new Medicines after you have completely understood and  accept all the possible adverse reactions/side effects.   Please note  You were cared for by a hospitalist during your hospital stay. If you have any questions about your discharge medications or the care you received while you were in the hospital after you are discharged, you can call the unit and asked to speak with the hospitalist on call if the hospitalist that took care of you is not available. Once you are discharged, your primary care physician will handle any further medical issues. Please note that NO REFILLS for any discharge medications will be authorized once you are discharged, as it is imperative that you return to your primary care physician (or establish a relationship with a primary care physician if you do not have one) for your aftercare needs so that they can reassess your need for medications and monitor your lab values.    Today   CHIEF COMPLAINT:   Chief Complaint  Patient presents with  . Weakness    HISTORY OF PRESENT ILLNESS:  Nicole Bass  is a 69 y.o. female with a known history of Chrohn's Disease on chronic steroids and anemai who presents to the ED with generalized weakness and diarrhea. Patient on antibiotics for a sinus infection which has caused her to have diarrhea. She reports her legs gave out and defecated on herself. She was brought to the ER for further workup because she had presyncope. She did not hit her  head or have LOC. She denies sick contacts, fever, abdominal pain, nausea/vomitting.  She is found to have persistent hypotension despite fluid rescucitation and has acute kidney injury.    VITAL SIGNS:  Blood pressure 129/62, pulse 72, temperature 98.2 F (36.8 C), temperature source Oral, resp. rate 18, height 5' 1"  (1.549 m), weight 89.4 kg, SpO2 98 %.  I/O:    Intake/Output Summary (Last 24 hours) at 08/26/2018 1325 Last data filed at 08/26/2018 1100 Gross per 24 hour  Intake 360 ml  Output -  Net 360 ml    PHYSICAL EXAMINATION:   GENERAL:  68 y.o.-year-old patient lying in the bed with no acute distress.  EYES: Pupils equal, round, reactive to light and accommodation. No scleral icterus. Extraocular muscles intact.  HEENT: Head atraumatic, normocephalic. Oropharynx and nasopharynx clear.  NECK:  Supple, no jugular venous distention. No thyroid enlargement, no tenderness.  LUNGS: Normal breath sounds bilaterally, no wheezing, rales,rhonchi or crepitation. No use of accessory muscles of respiration.  CARDIOVASCULAR: S1, S2 normal. No murmurs, rubs, or gallops.  ABDOMEN: Soft, nontender, nondistended. Bowel sounds present. No organomegaly or mass.  EXTREMITIES: No pedal edema, cyanosis, or clubbing.  NEUROLOGIC: Cranial nerves II through XII are intact. Muscle strength 5/5 in all extremities except left upper limb 3-4/5. Sensation intact. Gait not checked.  PSYCHIATRIC: The patient is alert and oriented x 3.  SKIN: No obvious rash, lesion, or ulcer.   DATA REVIEW:   CBC Recent Labs  Lab 08/24/18 0533  WBC 11.8*  HGB 8.2*  HCT 25.9*  PLT 185    Chemistries  Recent Labs  Lab 08/23/18 0445  08/24/18 0533  NA 136  --  141  K 3.4*  --  4.0  CL 103  --  117*  CO2 23  --  18*  GLUCOSE 132*  --  128*  BUN 25*  --  18  CREATININE 1.58*   < > 1.02*  CALCIUM 9.0  --  7.6*  AST 27  --   --   ALT 20  --   --  ALKPHOS 58  --   --   BILITOT 0.6  --   --    < > =  values in this interval not displayed.    Cardiac Enzymes Recent Labs  Lab 08/23/18 0818  TROPONINI <0.03    Microbiology Results  Results for orders placed or performed during the hospital encounter of 08/23/18  Gastrointestinal Panel by PCR , Stool     Status: None   Collection Time: 08/23/18  4:14 AM  Result Value Ref Range Status   Campylobacter species NOT DETECTED NOT DETECTED Final   Plesimonas shigelloides NOT DETECTED NOT DETECTED Final   Salmonella species NOT DETECTED NOT DETECTED Final   Yersinia enterocolitica NOT DETECTED NOT DETECTED Final   Vibrio species NOT DETECTED NOT DETECTED Final   Vibrio cholerae NOT DETECTED NOT DETECTED Final   Enteroaggregative E coli (EAEC) NOT DETECTED NOT DETECTED Final   Enteropathogenic E coli (EPEC) NOT DETECTED NOT DETECTED Final   Enterotoxigenic E coli (ETEC) NOT DETECTED NOT DETECTED Final   Shiga like toxin producing E coli (STEC) NOT DETECTED NOT DETECTED Final   Shigella/Enteroinvasive E coli (EIEC) NOT DETECTED NOT DETECTED Final   Cryptosporidium NOT DETECTED NOT DETECTED Final   Cyclospora cayetanensis NOT DETECTED NOT DETECTED Final   Entamoeba histolytica NOT DETECTED NOT DETECTED Final   Giardia lamblia NOT DETECTED NOT DETECTED Final   Adenovirus F40/41 NOT DETECTED NOT DETECTED Final   Astrovirus NOT DETECTED NOT DETECTED Final   Norovirus GI/GII NOT DETECTED NOT DETECTED Final   Rotavirus A NOT DETECTED NOT DETECTED Final   Sapovirus (I, II, IV, and V) NOT DETECTED NOT DETECTED Final    Comment: Performed at [facility], [address] [place], [place] [address]  C difficile quick scan w PCR reflex     Status: None   Collection Time: 08/23/18  4:14 AM  Result Value Ref Range Status   C Diff antigen NEGATIVE NEGATIVE Final   C Diff toxin NEGATIVE NEGATIVE Final   C Diff interpretation No C. difficile detected.  Final    Comment: Performed at [facility], [address] [place],  [place] [address]    RADIOLOGY:  Mr Herby Abraham Contrast  Result Date: 08/24/2018 CLINICAL DATA:  Syncope.  Left arm and right leg weakness. EXAM: MRI HEAD WITHOUT CONTRAST TECHNIQUE: Multiplanar, multiecho pulse sequences of the brain and surrounding structures were obtained without intravenous contrast. COMPARISON:  Head CT 08/23/2018 and MRI 11/29/2017 FINDINGS: Brain: There is no evidence of acute infarct, intracranial hemorrhage, mass, midline shift, or extra-axial fluid collection. The ventricles and sulci are within normal limits for age. Patchy T2 hyperintensities in the cerebral white matter bilaterally have mildly progressed from the prior MRI and are nonspecific but compatible with mild chronic small vessel ischemic disease. 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 Vascular: Major intracranial vascular flow voids are preserved. Skull and upper cervical spine: Unremarkable bone marrow signal. Sinuses/Orbits: Unremarkable orbits. Clear paranasal sinuses. Trace left mastoid effusion. Other: None. IMPRESSION: 1. No acute intracranial abnormality. 2. Mild chronic small vessel ischemic disease, mildly progressed from 11/2017. Electronically Signed   By: Logan Bores M.D.   On: 08/24/2018 16:30    EKG:   Orders placed or performed during the hospital encounter of 08/23/18  . ED EKG  . ED EKG  . EKG 12-Lead  . EKG 12-Lead      Management plans discussed with the patient, family and they are in  agreement.  CODE STATUS:  Full.    Code Status Orders  (From admission, onward)         Start     Ordered   08/23/18 1328  Full code  Continuous     08/23/18 1327        Code Status History    Date Active Date Inactive Code Status Order ID Comments User Context   09/06/2017 1719 09/08/2017 1557 Full Code [id number]  Gorden Harms, MD Inpatient   09/04/2015 1957 09/07/2015 1858 Full Code [id number]  Hillary Bow, MD ED   06/04/2015 1106 06/04/2015 1508 Full Code  [id number]  Hessie Knows, MD Inpatient   04/14/2015 1840 04/14/2015 2245 Full Code [id number]  Hessie Knows, MD Inpatient    Advance Directive Documentation     Most Recent Value  Type of Advance Directive  Healthcare Power of [place], Living will  Pre-existing out of facility DNR order (yellow form or pink MOST form)  -  "MOST" Form in Place?  -      TOTAL TIME TAKING CARE OF THIS PATIENT: 35 minutes.    Vaughan Basta M.D on 08/26/2018 at 1:25 PM  Between 7am to 6pm - Pager - [phone]  After 6pm go to [email or web address] - password [email or web address] [place] [facility]  Office  [phone]  CC: Primary care physician; [name], MD   Note: This dictation was prepared with Dragon dictation along with smaller phrase technology. Any transcriptional errors that result from this process are unintentional.

## 2018-08-26 NOTE — Plan of Care (Signed)
  Problem: Health Behavior/Discharge Planning: Goal: Ability to manage health-related needs will improve 08/26/2018 0031 by Herbie Baltimore, RN Outcome: Progressing 08/26/2018 0010 by Herbie Baltimore, RN Outcome: Progressing   Problem: Clinical Measurements: Goal: Ability to maintain clinical measurements within normal limits will improve 08/26/2018 0031 by Herbie Baltimore, RN Outcome: Progressing 08/26/2018 0010 by Herbie Baltimore, RN Outcome: Progressing Goal: Will remain free from infection 08/26/2018 0031 by Herbie Baltimore, RN Outcome: Progressing 08/26/2018 0010 by Herbie Baltimore, RN Outcome: Progressing Goal: Diagnostic test results will improve 08/26/2018 0031 by Herbie Baltimore, RN Outcome: Progressing 08/26/2018 0010 by Herbie Baltimore, RN Outcome: Progressing   Problem: Activity: Goal: Risk for activity intolerance will decrease 08/26/2018 0031 by Herbie Baltimore, RN Outcome: Progressing 08/26/2018 0010 by Herbie Baltimore, RN Outcome: Progressing   Problem: Coping: Goal: Level of anxiety will decrease 08/26/2018 0031 by Herbie Baltimore, RN Outcome: Progressing 08/26/2018 0010 by Herbie Baltimore, RN Outcome: Progressing   Problem: Pain Managment: Goal: General experience of comfort will improve 08/26/2018 0031 by Herbie Baltimore, RN Outcome: Progressing 08/26/2018 0010 by Herbie Baltimore, RN Outcome: Progressing   Problem: Safety: Goal: Ability to remain free from injury will improve 08/26/2018 0031 by Herbie Baltimore, RN Outcome: Progressing 08/26/2018 0010 by Herbie Baltimore, RN Outcome: Progressing

## 2018-08-26 NOTE — Progress Notes (Signed)
Reviewed AVS with pt, Pt verbalized understanding.  3 printed RX with AVS

## 2018-08-26 NOTE — Plan of Care (Signed)
  Problem: Health Behavior/Discharge Planning: Goal: Ability to manage health-related needs will improve Outcome: Progressing   Problem: Clinical Measurements: Goal: Ability to maintain clinical measurements within normal limits will improve Outcome: Progressing Goal: Will remain free from infection Outcome: Progressing Goal: Diagnostic test results will improve Outcome: Progressing   Problem: Activity: Goal: Risk for activity intolerance will decrease Outcome: Progressing   Problem: Coping: Goal: Level of anxiety will decrease Outcome: Progressing   Problem: Pain Managment: Goal: General experience of comfort will improve Outcome: Progressing   Problem: Safety: Goal: Ability to remain free from injury will improve Outcome: Progressing

## 2018-08-26 NOTE — Progress Notes (Addendum)
notified Dr. Anselm Jungling to inform that IV Toradol is burring pt arm, pt is requesting Po medication around the clock, pt is developing a cold sore can she have something?  Awaiting response   See new orders

## 2018-09-04 DIAGNOSIS — E611 Iron deficiency: Secondary | ICD-10-CM | POA: Insufficient documentation

## 2018-09-09 DIAGNOSIS — D649 Anemia, unspecified: Secondary | ICD-10-CM | POA: Insufficient documentation

## 2018-09-09 NOTE — Progress Notes (Signed)
[place]  [phone] [phone]) [phone]  ID: [name] OB: 1951/08/07  MR#: [id number]  LDJ#:[id number]  Patient Care Team: [name], MD as PCP - General (Internal Medicine)  CHIEF COMPLAINT: Iron deficiency anemia.  INTERVAL HISTORY: Patient is a 68 year old female with a history of Crohn's disease who was recently found to have a declining hemoglobin and iron stores.  She previously received iron infusions, but none for 7 or 8 years.  She has increased weakness and fatigue, but otherwise feels well.  She has noted no melena or hematochezia.  She has no neurologic complaints.  She denies any recent fevers or illnesses.  She has good appetite and denies weight loss.  She has no chest pain or shortness of breath.  She denies any nausea, vomiting, constipation, or diarrhea.  She has no urinary complaints.  Patient otherwise feels well and offers no further specific complaints today.  REVIEW OF SYSTEMS:   Review of Systems  Constitutional: Positive for malaise/fatigue. Negative for fever and weight loss.  Respiratory: Negative.  Negative for cough, hemoptysis and shortness of breath.   Cardiovascular: Negative.  Negative for chest pain and leg swelling.  Gastrointestinal: Negative.  Negative for abdominal pain, blood in stool, nausea and vomiting.  Genitourinary: Negative.  Negative for hematuria.  Musculoskeletal: Negative.  Negative for back pain.  Skin: Negative.  Negative for rash.  Neurological: Negative.  Negative for focal weakness, weakness and headaches.  Psychiatric/Behavioral: Negative.  The patient is not nervous/anxious.     As per HPI. Otherwise, a complete review of systems is negative.  PAST MEDICAL HISTORY: Past Medical History:  Diagnosis Date  . Anemia   . Arthritis   . Collagen vascular disease ([place])   . Crohn's disease ([place])   . DDD (degenerative disc disease), cervical   . DDD (degenerative disc disease),  cervical   . DDD (degenerative disc disease), cervical 2003  . DDD (degenerative disc disease), lumbar   . Degenerative disc disease, cervical   . Difficult intubation     PAST SURGICAL HISTORY: Past Surgical History:  Procedure Laterality Date  . ABDOMINAL HYSTERECTOMY    . APPENDECTOMY    . BACK SURGERY     x 3  . [place]  . CHOLECYSTECTOMY    . COLON SURGERY  1999   removed 18 inches of colon  . DILATION AND CURETTAGE OF UTERUS  2004  . ELBOW FRACTURE SURGERY  2008  . JOINT REPLACEMENT Right 2015   partial knee replacement  . KYPHOPLASTY    . KYPHOPLASTY N/A 04/14/2015   Procedure: KYPHOPLASTY;  Surgeon: Hessie Knows, MD;  Location: [facility] ORS;  Service: Orthopedics;  Laterality: N/A;  . KYPHOPLASTY N/A 06/04/2015   Procedure: KYPHOPLASTY L-5;  Surgeon: Hessie Knows, MD;  Location: [facility] OR[facility];  Service: Orthopedics;  Laterality: N/A;  . MEDIAL PARTIAL KNEE REPLACEMENT    . OOPHORECTOMY Bilateral   . SHOULDER ARTHROSCOPY WITH ROTATOR CUFF REPAIR AND SUBACROMIAL DECOMPRESSION Right 04/13/2016   Procedure: SHOULDER ARTHROSCOPY WITH ROTATOR CUFF REPAIR AND SUBACROMIAL DECOMPRESSION, release of long head biceps tendon;  Surgeon: Leanor Kail, MD;  Location: [facility] ORS;  Service: Orthopedics;  Laterality: Right;  . TOE SURGERY  2013  . TONSILLECTOMY    . WRIST FRACTURE SURGERY Bilateral     FAMILY HISTORY: Family History  Problem Relation Age of Onset  . Diabetes Mother   . Emphysema Mother   . Emphysema Father   . Colon cancer  Maternal Grandfather   . Breast cancer Neg Hx   . Ovarian cancer Neg Hx     ADVANCED DIRECTIVES (Y/N):  N  HEALTH MAINTENANCE: Social History   Tobacco Use  . Smoking status: Never Smoker  . Smokeless tobacco: Never Used  Substance Use Topics  . Alcohol use: No  . Drug use: No     Colonoscopy:  PAP:  Bone density:  Lipid panel:  Allergies  Allergen Reactions  . Ciprofloxacin Itching and Rash  . Levofloxacin  Hives  . Gabapentin     Other reaction(s): Other (See Comments) Dry mouth  . Methotrexate Derivatives     Mouth and throat ulcers  . Sulfa Antibiotics Other (See Comments)    Other reaction(s): Other (See Comments), Unknown Other reaction(s): Other (See Comments), Unknown Per pts MD she is unable to take this medication because it is contraindicated with her other medications. Per pts MD she is unable to take this medication because it is contraindicated with her other medications. Other reaction(s): Other (See Comments), Unknown Other reaction(s): Other (See Comments), Unknown Per pts MD she is unable to take this medication because it is contraindicated with her other medications. Per pts MD she is unable to take this medication because it is contraindicated with her other medications. Other reaction(s): Other (See Comments), Unknown Per pts MD she is unable to take this medication because it is contraindicated with her other medications. Per pts MD she is unable to take this medication because it is contraindicated with her other medications.    . Cefazolin Itching and Rash  . Cefprozil Rash  . Cephalosporins Itching and Rash  . Enbrel [Etanercept] Itching and Rash  . Humira [Adalimumab] Itching and Rash  . Hydrocodone-Acetaminophen Itching and Rash  . Levaquin [Levofloxacin In D5w] Itching and Rash  . Lorabid [Loracarbef] Itching and Rash  . Meropenem Itching and Rash  . Oxycodone Itching and Rash  . Oxycodone-Acetaminophen Itching and Rash  . Primidone Itching and Rash    Current Outpatient Medications  Medication Sig Dispense Refill  . acetaminophen (TYLENOL) 500 MG tablet Take 500 mg by mouth every 6 (six) hours as needed for mild pain or moderate pain.    Marland Kitchen acyclovir (ZOVIRAX) 200 MG capsule Take 1 capsule by mouth 5 (five) times daily.    Marland Kitchen alendronate (FOSAMAX) 70 MG tablet Take 70 mg by mouth once a week. Take with a full glass of water on an empty stomach.      Marland Kitchen aspirin EC 81 MG tablet Take 81 mg by mouth daily.    Marland Kitchen b complex vitamins tablet Take 1 tablet by mouth daily.    . Calcium-Vitamin D-Vitamin K (VIACTIV PO) Take 2 tablets by mouth every evening.     . cetirizine (ZYRTEC) 10 MG tablet Take 10 mg by mouth daily.    . chlorhexidine (PERIDEX) 0.12 % solution Use as directed 15 mLs in the mouth or throat 4 (four) times daily. 120 mL 0  . cholecalciferol (VITAMIN D) 25 MCG (1000 UT) tablet Take 1,000 Units by mouth daily.    Marland Kitchen conjugated estrogens (PREMARIN) vaginal cream Place 2.70 Applicatorfuls vaginally 2 (two) times a week. 42.5 g 6  . cyanocobalamin (,VITAMIN B-12,) 1000 MCG/ML injection Inject 1,000 mcg into the muscle every 30 (thirty) days.    . diphenoxylate-atropine (LOMOTIL) 2.5-0.025 MG per tablet Take 1 tablet by mouth 4 (four) times daily as needed for diarrhea or loose stools.     . ferrous sulfate 325 (  65 FE) MG tablet Take 1 tablet (325 mg total) by mouth 2 (two) times daily with a meal. 60 tablet 0  . furosemide (LASIX) 20 MG tablet Take 20 mg by mouth every morning.     . gabapentin (NEURONTIN) 400 MG capsule Take 400 mg by mouth at bedtime.    Marland Kitchen leucovorin (WELLCOVORIN) 5 MG tablet Take 1 tablet (5 mg total) by mouth daily. 1 tablet 0  . lidocaine (XYLOCAINE) 2 % solution Use as directed 15 mLs in the mouth or throat every 4 (four) hours as needed for mouth pain. 100 mL 0  . magnesium oxide (MAG-OX) 400 MG tablet Take 400 mg by mouth daily.     . methimazole (TAPAZOLE) 5 MG tablet Take 2.5 mg by mouth daily.    . Multiple Vitamins-Minerals (MULTIVITAMIN WITH MINERALS) tablet Take 1 tablet by mouth daily.    . naproxen (NAPROSYN) 500 MG tablet Take 500 mg by mouth 2 (two) times daily with a meal.    . omeprazole (PRILOSEC) 40 MG capsule Take 40 mg by mouth every morning.     . phenol (CHLORASEPTIC) 1.4 % LIQD Use as directed 1 spray in the mouth or throat as needed for throat irritation / pain. 1 Bottle 0  . potassium chloride  SA (K-DUR,KLOR-CON) 20 MEQ tablet Take 20 mEq by mouth 2 (two) times daily.     . Probiotic Product (PROBIOTIC PO) Take 1 tablet by mouth 2 (two) times daily.     Marland Kitchen rOPINIRole (REQUIP) 1 MG tablet Take 1 mg by mouth at bedtime as needed (for restless legs).     . sucralfate (CARAFATE) 1 g tablet Take 1 g by mouth 3 (three) times daily with meals as needed (for stomach cramping).    Marland Kitchen telmisartan (MICARDIS) 40 MG tablet Take 1 tablet by mouth 1 day or 1 dose.    . Tofacitinib Citrate (XELJANZ) 5 MG TABS Take 5 mg by mouth 2 (two) times daily.     . traMADol (ULTRAM) 50 MG tablet Take 50 mg by mouth every 6 (six) hours as needed for moderate pain.      No current facility-administered medications for this visit.     OBJECTIVE: Vitals:   09/12/18 1136  BP: 126/73  Pulse: (!) 111  Temp: 97.9 F (36.6 C)     Body mass index is 35.84 kg/m.    ECOG FS:0 - Asymptomatic  General: Well-developed, well-nourished, no acute distress. Eyes: Pink conjunctiva, anicteric sclera. HEENT: Normocephalic, moist mucous membranes, clear oropharnyx. Lungs: Clear to auscultation bilaterally. Heart: Regular rate and rhythm. No rubs, murmurs, or gallops. Abdomen: Soft, nontender, nondistended. No organomegaly noted, normoactive bowel sounds. Musculoskeletal: No edema, cyanosis, or clubbing. Neuro: Alert, answering all questions appropriately. Cranial nerves grossly intact. Skin: No rashes or petechiae noted. Psych: Normal affect. Lymphatics: No cervical, calvicular, axillary or inguinal LAD.   LAB RESULTS:  Lab Results  Component Value Date   NA 141 08/24/2018   K 4.0 08/24/2018   CL 117 (H) 08/24/2018   CO2 18 (L) 08/24/2018   GLUCOSE 128 (H) 08/24/2018   BUN 18 08/24/2018   CREATININE 1.02 (H) 08/24/2018   CALCIUM 7.6 (L) 08/24/2018   PROT 6.6 08/23/2018   ALBUMIN 3.4 (L) 08/23/2018   AST 27 08/23/2018   ALT 20 08/23/2018   ALKPHOS 58 08/23/2018   BILITOT 0.6 08/23/2018   GFRNONAA 58 (L)  08/24/2018   GFRAA >60 08/24/2018    Lab Results  Component Value Date  WBC 11.8 (H) 08/24/2018   NEUTROABS 15.6 (H) 09/06/2017   HGB 8.2 (L) 08/24/2018   HCT 25.9 (L) 08/24/2018   MCV 98.5 08/24/2018   PLT 185 08/24/2018   Lab Results  Component Value Date   IRON 14 (L) 08/24/2018   TIBC 277 08/24/2018   IRONPCTSAT 5 (L) 08/24/2018   Lab Results  Component Value Date   FERRITIN 123 08/24/2018     STUDIES: Ct Head Wo Contrast  Result Date: 08/23/2018 CLINICAL DATA:  Altered level of consciousness EXAM: CT HEAD WITHOUT CONTRAST TECHNIQUE: Contiguous axial images were obtained from the base of the skull through the vertex without intravenous contrast. COMPARISON:  11/29/2017 brain MRI FINDINGS: Brain: No evidence of acute infarction, hemorrhage, hydrocephalus, extra-axial collection or mass lesion/mass effect. Vascular: No hyperdense vessel or unexpected calcification. Skull: Normal. Negative for fracture or focal lesion. Sinuses/Orbits: No acute finding. IMPRESSION: Negative head CT. Electronically Signed   By: Monte Fantasia M.D.   On: 08/23/2018 06:07   Mr Brain Wo Contrast  Result Date: 08/24/2018 CLINICAL DATA:  Syncope.  Left arm and right leg weakness. EXAM: MRI HEAD WITHOUT CONTRAST TECHNIQUE: Multiplanar, multiecho pulse sequences of the brain and surrounding structures were obtained without intravenous contrast. COMPARISON:  Head CT 08/23/2018 and MRI 11/29/2017 FINDINGS: Brain: There is no evidence of acute infarct, intracranial hemorrhage, mass, midline shift, or extra-axial fluid collection. The ventricles and sulci are within normal limits for age. Patchy T2 hyperintensities in the cerebral white matter bilaterally have mildly progressed from the prior MRI and are nonspecific but compatible with mild chronic small vessel ischemic disease. 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 Vascular: Major intracranial vascular flow voids are  preserved. Skull and upper cervical spine: Unremarkable bone marrow signal. Sinuses/Orbits: Unremarkable orbits. Clear paranasal sinuses. Trace left mastoid effusion. Other: None. IMPRESSION: 1. No acute intracranial abnormality. 2. Mild chronic small vessel ischemic disease, mildly progressed from 11/2017. Electronically Signed   By: Logan Bores M.D.   On: 08/24/2018 16:30   Dg Hip Unilat W Or Wo Pelvis 2-3 Views Right  Result Date: 08/23/2018 CLINICAL DATA:  Right hip pain after fall. EXAM: DG HIP (WITH OR WITHOUT PELVIS) 2-3V RIGHT COMPARISON:  None. FINDINGS: There is no evidence of hip fracture or dislocation. Prior L5 vertebroplasty. IMPRESSION: Negative. Electronically Signed   By: Monte Fantasia M.D.   On: 08/23/2018 06:04    ASSESSMENT: Iron deficiency anemia.  PLAN:   1.  Iron deficiency anemia: Although patient has noted no melena or hematochezia, this is likely related to her Crohn's disease.  She reports she gets regular colonoscopies.  Patient has had iron infusions in the past, but none for 7 or 8 years.  Her hemoglobin and iron stores are significantly decreased and she is symptomatic.  Return to clinic in 1 and 2 weeks to receive 510 mg IV Feraheme.  Patient will then return to clinic in 3 months for repeat laboratory work, further evaluation, and consideration of additional IV Feraheme. 2.  Crohn's disease: Continue follow-up and treatment with GI.  I spent a total of 45 minutes face-to-face with the patient of which greater than 50% of the visit was spent in counseling and coordination of care as detailed above.  Patient expressed understanding and was in agreement with this plan. She also understands that She can call clinic at any time with any questions, concerns, or complaints.    Lloyd Huger, MD   09/13/2018 7:06 AM

## 2018-09-12 ENCOUNTER — Other Ambulatory Visit: Payer: Self-pay

## 2018-09-12 ENCOUNTER — Inpatient Hospital Stay: Payer: Medicare Other | Attending: Oncology | Admitting: Oncology

## 2018-09-12 DIAGNOSIS — K5090 Crohn's disease, unspecified, without complications: Secondary | ICD-10-CM | POA: Insufficient documentation

## 2018-09-12 DIAGNOSIS — D649 Anemia, unspecified: Secondary | ICD-10-CM

## 2018-09-12 DIAGNOSIS — D509 Iron deficiency anemia, unspecified: Secondary | ICD-10-CM | POA: Insufficient documentation

## 2018-09-12 NOTE — Progress Notes (Signed)
Patient is here today to establish care on her iron deficiency, anemia. Patient stated that she stays tired and fatigued all the time for the past month. Patient had passed out three weeks ago and was taken to the ED. Patient was at the ED for three days. Patient's PCP recommended for her to be seen since her RBC's and hemoglobin were low.

## 2018-09-13 ENCOUNTER — Other Ambulatory Visit: Payer: Self-pay | Admitting: Oncology

## 2018-09-13 DIAGNOSIS — D509 Iron deficiency anemia, unspecified: Secondary | ICD-10-CM | POA: Insufficient documentation

## 2018-09-14 ENCOUNTER — Inpatient Hospital Stay: Payer: Medicare Other

## 2018-09-14 VITALS — BP 117/66 | HR 80 | Temp 98.6°F | Resp 18

## 2018-09-14 DIAGNOSIS — D509 Iron deficiency anemia, unspecified: Secondary | ICD-10-CM | POA: Diagnosis not present

## 2018-09-14 MED ORDER — SODIUM CHLORIDE 0.9 % IV SOLN
Freq: Once | INTRAVENOUS | Status: AC
  Administered 2018-09-14: 13:00:00 via INTRAVENOUS
  Filled 2018-09-14: qty 250

## 2018-09-14 MED ORDER — SODIUM CHLORIDE 0.9 % IV SOLN
510.0000 mg | Freq: Once | INTRAVENOUS | Status: AC
  Administered 2018-09-14: 510 mg via INTRAVENOUS
  Filled 2018-09-14: qty 17

## 2018-09-21 ENCOUNTER — Inpatient Hospital Stay: Payer: Medicare Other | Attending: Oncology

## 2018-09-21 VITALS — BP 111/68 | HR 91 | Temp 96.8°F | Resp 18

## 2018-09-21 DIAGNOSIS — D509 Iron deficiency anemia, unspecified: Secondary | ICD-10-CM | POA: Diagnosis not present

## 2018-09-21 MED ORDER — SODIUM CHLORIDE 0.9 % IV SOLN
Freq: Once | INTRAVENOUS | Status: AC
  Administered 2018-09-21: 11:00:00 via INTRAVENOUS
  Filled 2018-09-21: qty 250

## 2018-09-21 MED ORDER — SODIUM CHLORIDE 0.9 % IV SOLN
510.0000 mg | Freq: Once | INTRAVENOUS | Status: AC
  Administered 2018-09-21: 510 mg via INTRAVENOUS
  Filled 2018-09-21: qty 17

## 2018-11-15 ENCOUNTER — Other Ambulatory Visit: Payer: Self-pay | Admitting: Internal Medicine

## 2018-11-15 DIAGNOSIS — N390 Urinary tract infection, site not specified: Secondary | ICD-10-CM

## 2018-12-05 ENCOUNTER — Other Ambulatory Visit: Payer: Self-pay

## 2018-12-05 ENCOUNTER — Ambulatory Visit
Admission: RE | Admit: 2018-12-05 | Discharge: 2018-12-05 | Disposition: A | Discharge status: Home / Self Care | Payer: Medicare Other | Source: Ambulatory Visit | Attending: Internal Medicine | Admitting: Internal Medicine

## 2018-12-05 DIAGNOSIS — N390 Urinary tract infection, site not specified: Secondary | ICD-10-CM | POA: Insufficient documentation

## 2018-12-16 ENCOUNTER — Emergency Department: Payer: Medicare Other

## 2018-12-16 ENCOUNTER — Other Ambulatory Visit: Payer: Self-pay

## 2018-12-16 ENCOUNTER — Encounter: Payer: Self-pay | Admitting: Emergency Medicine

## 2018-12-16 ENCOUNTER — Emergency Department
Admission: EM | Admit: 2018-12-16 | Discharge: 2018-12-16 | Disposition: A | Discharge status: Home / Self Care | Payer: Medicare Other | Attending: Emergency Medicine | Admitting: Emergency Medicine

## 2018-12-16 DIAGNOSIS — M25562 Pain in left knee: Secondary | ICD-10-CM | POA: Diagnosis not present

## 2018-12-16 DIAGNOSIS — Y92410 Unspecified street and highway as the place of occurrence of the external cause: Secondary | ICD-10-CM | POA: Diagnosis not present

## 2018-12-16 DIAGNOSIS — Z96651 Presence of right artificial knee joint: Secondary | ICD-10-CM | POA: Insufficient documentation

## 2018-12-16 DIAGNOSIS — Z7982 Long term (current) use of aspirin: Secondary | ICD-10-CM | POA: Diagnosis not present

## 2018-12-16 DIAGNOSIS — Z79899 Other long term (current) drug therapy: Secondary | ICD-10-CM | POA: Insufficient documentation

## 2018-12-16 DIAGNOSIS — S0003XA Contusion of scalp, initial encounter: Secondary | ICD-10-CM | POA: Insufficient documentation

## 2018-12-16 DIAGNOSIS — Y999 Unspecified external cause status: Secondary | ICD-10-CM | POA: Diagnosis not present

## 2018-12-16 DIAGNOSIS — Y939 Activity, unspecified: Secondary | ICD-10-CM | POA: Diagnosis not present

## 2018-12-16 DIAGNOSIS — M25561 Pain in right knee: Secondary | ICD-10-CM | POA: Diagnosis not present

## 2018-12-16 DIAGNOSIS — S62641A Nondisplaced fracture of proximal phalanx of left index finger, initial encounter for closed fracture: Secondary | ICD-10-CM | POA: Insufficient documentation

## 2018-12-16 DIAGNOSIS — S6982XA Other specified injuries of left wrist, hand and finger(s), initial encounter: Secondary | ICD-10-CM | POA: Diagnosis present

## 2018-12-16 MED ORDER — ACETAMINOPHEN 325 MG PO TABS
650.0000 mg | ORAL_TABLET | Freq: Once | ORAL | Status: AC
  Administered 2018-12-16: 650 mg via ORAL
  Filled 2018-12-16: qty 2

## 2018-12-16 MED ORDER — METHOCARBAMOL 500 MG PO TABS: 500.0000 mg | ORAL_TABLET | Freq: Three times a day (TID) | ORAL | 0 refills | Status: AC | PRN
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
## 2018-12-16 NOTE — ED Notes (Signed)
Provider in to inspect splint. Patient counseled on cap refill. Patient return demonstrated how to check.

## 2018-12-16 NOTE — ED Notes (Signed)
Patient receiving CT of hand.

## 2018-12-16 NOTE — ED Notes (Signed)
Received report from Manuela Schwartz, Therapist, sports.
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
## 2018-12-17 ENCOUNTER — Inpatient Hospital Stay: Payer: Medicare Other | Admitting: Oncology

## 2018-12-17 ENCOUNTER — Inpatient Hospital Stay: Payer: Medicare Other

## 2018-12-25 ENCOUNTER — Ambulatory Visit: Payer: Medicare Other | Admitting: Urology

## 2019-01-03 ENCOUNTER — Other Ambulatory Visit: Payer: Self-pay

## 2019-01-03 ENCOUNTER — Encounter
Admission: RE | Admit: 2019-01-03 | Discharge: 2019-01-03 | Disposition: A | Discharge status: Home / Self Care | Payer: Medicare Other | Source: Ambulatory Visit | Attending: Orthopedic Surgery | Admitting: Orthopedic Surgery

## 2019-01-03 HISTORY — DX: Polyneuropathy, unspecified: G62.9

## 2019-01-03 HISTORY — DX: Gastro-esophageal reflux disease without esophagitis: K21.9

## 2019-01-03 HISTORY — DX: Myoneural disorder, unspecified: G70.9

## 2019-01-03 HISTORY — DX: Essential (primary) hypertension: I10

## 2019-01-03 NOTE — Patient Instructions (Signed)
Your procedure is scheduled on: Tues 5/28 Report to Day Surgery. [facility] To find out your arrival time please call [phone] between 1PM - 3PM on tomorrow.  Remember: Instructions that are not followed completely may result in serious medical risk,  up to and including death, or upon the discretion of your surgeon and anesthesiologist your  surgery may need to be rescheduled.     _X__ 1. Do not eat food after midnight the night before your procedure.                 No gum chewing or hard candies. You may drink clear liquids up to 2 hours                 before you are scheduled to arrive for your surgery- DO not drink clear                 liquids within 2 hours of the start of your surgery.                 Clear Liquids include:  water, apple juice without pulp, clear carbohydrate                 drink such as Clearfast of Gatorade, Black Coffee or Tea (Do not add                 anything to coffee or tea).  __X__2.  On the morning of surgery brush your teeth with toothpaste and water, you                may rinse your mouth with mouthwash if you wish.  Do not swallow any toothpaste of mouthwash.     _X__ 3.  No Alcohol for 24 hours before or after surgery.   ___ 4.  Do Not Smoke or use e-cigarettes For 24 Hours Prior to Your Surgery.                 Do not use any chewable tobacco products for at least 6 hours prior to                 surgery.  ____  5.  Bring all medications with you on the day of surgery if instructed.   __x__  6.  Notify your doctor if there is any change in your medical condition      (cold, fever, infections).     Do not wear jewelry, make-up, hairpins, clips or nail polish. Do not wear lotions, powders, or perfumes. You may wear deodorant. Do not shave 48 hours prior to surgery. Men may shave face and neck. Do not bring valuables to the hospital.    [facility] is not responsible for any belongings or  valuables.  Contacts, dentures or bridgework may not be worn into surgery. Leave your suitcase in the car. After surgery it may be brought to your room. For patients admitted to the hospital, discharge time is determined by your treatment team.   Patients discharged the day of surgery will not be allowed to drive home.   Please read over the following fact sheets that you were given:    __x__ Take these medicines the morning of surgery with A SIP OF WATER:    1. omeprazole (PRILOSEC) 40 MG capsule dose the night before and one the morning of surgery  2. Pain medication as needed  3.   4.  5.  6.  ____ Fleet Enema (  as directed)   __x__ Use CHG Soap as directed  ____ Use inhalers on the day of surgery  ____ Stop metformin 2 days prior to surgery    ____ Take 1/2 of usual insulin dose the night before surgery. No insulin the morning          of surgery.   _x___ Stop Aspirin today  __x__ Stop Morrie Sheldon on Sunday as instructed per MD    ____ Stop supplements until after surgery.    ____ Bring C-Pap to the hospital.

## 2019-01-04 ENCOUNTER — Other Ambulatory Visit
Admission: RE | Admit: 2019-01-04 | Discharge: 2019-01-04 | Disposition: A | Discharge status: Home / Self Care | Payer: Medicare Other | Source: Ambulatory Visit | Attending: Orthopedic Surgery | Admitting: Orthopedic Surgery

## 2019-01-04 DIAGNOSIS — Z1159 Encounter for screening for other viral diseases: Secondary | ICD-10-CM | POA: Diagnosis not present

## 2019-01-05 LAB — NOVEL CORONAVIRUS, NAA (HOSP ORDER, SEND-OUT TO REF LAB; TAT 18-24 HRS): SARS-CoV-2, NAA: NOT DETECTED

## 2019-01-07 MED ORDER — CLINDAMYCIN PHOSPHATE 600 MG/50ML IV SOLN
600.0000 mg | Freq: Once | INTRAVENOUS | Status: AC
  Administered 2019-01-08: 08:00:00 600 mg via INTRAVENOUS

## 2019-01-08 ENCOUNTER — Other Ambulatory Visit: Payer: Self-pay

## 2019-01-08 ENCOUNTER — Ambulatory Visit
Admission: RE | Admit: 2019-01-08 | Discharge: 2019-01-08 | Disposition: A | Discharge status: Home / Self Care | Payer: Medicare Other | Attending: Orthopedic Surgery | Admitting: Orthopedic Surgery

## 2019-01-08 ENCOUNTER — Ambulatory Visit: Payer: Medicare Other | Admitting: Anesthesiology

## 2019-01-08 ENCOUNTER — Encounter
Admission: RE | Disposition: A | Discharge status: Home / Self Care | Payer: Self-pay | Source: Home / Self Care | Attending: Orthopedic Surgery

## 2019-01-08 DIAGNOSIS — Z800 Family history of malignant neoplasm of digestive organs: Secondary | ICD-10-CM | POA: Insufficient documentation

## 2019-01-08 DIAGNOSIS — K219 Gastro-esophageal reflux disease without esophagitis: Secondary | ICD-10-CM | POA: Insufficient documentation

## 2019-01-08 DIAGNOSIS — R251 Tremor, unspecified: Secondary | ICD-10-CM | POA: Diagnosis not present

## 2019-01-08 DIAGNOSIS — S66313A Strain of extensor muscle, fascia and tendon of left middle finger at wrist and hand level, initial encounter: Secondary | ICD-10-CM | POA: Insufficient documentation

## 2019-01-08 DIAGNOSIS — M1990 Unspecified osteoarthritis, unspecified site: Secondary | ICD-10-CM | POA: Insufficient documentation

## 2019-01-08 DIAGNOSIS — Z825 Family history of asthma and other chronic lower respiratory diseases: Secondary | ICD-10-CM | POA: Diagnosis not present

## 2019-01-08 DIAGNOSIS — Z820 Family history of epilepsy and other diseases of the nervous system: Secondary | ICD-10-CM | POA: Diagnosis not present

## 2019-01-08 DIAGNOSIS — E876 Hypokalemia: Secondary | ICD-10-CM | POA: Diagnosis not present

## 2019-01-08 DIAGNOSIS — M810 Age-related osteoporosis without current pathological fracture: Secondary | ICD-10-CM | POA: Diagnosis not present

## 2019-01-08 DIAGNOSIS — S66311A Strain of extensor muscle, fascia and tendon of left index finger at wrist and hand level, initial encounter: Secondary | ICD-10-CM | POA: Diagnosis not present

## 2019-01-08 DIAGNOSIS — K760 Fatty (change of) liver, not elsewhere classified: Secondary | ICD-10-CM | POA: Insufficient documentation

## 2019-01-08 DIAGNOSIS — Z8261 Family history of arthritis: Secondary | ICD-10-CM | POA: Insufficient documentation

## 2019-01-08 DIAGNOSIS — G4730 Sleep apnea, unspecified: Secondary | ICD-10-CM | POA: Insufficient documentation

## 2019-01-08 DIAGNOSIS — Z87442 Personal history of urinary calculi: Secondary | ICD-10-CM | POA: Insufficient documentation

## 2019-01-08 DIAGNOSIS — Z8249 Family history of ischemic heart disease and other diseases of the circulatory system: Secondary | ICD-10-CM | POA: Insufficient documentation

## 2019-01-08 DIAGNOSIS — Z8489 Family history of other specified conditions: Secondary | ICD-10-CM | POA: Insufficient documentation

## 2019-01-08 DIAGNOSIS — Z79899 Other long term (current) drug therapy: Secondary | ICD-10-CM | POA: Diagnosis not present

## 2019-01-08 DIAGNOSIS — Z9049 Acquired absence of other specified parts of digestive tract: Secondary | ICD-10-CM | POA: Insufficient documentation

## 2019-01-08 DIAGNOSIS — Z888 Allergy status to other drugs, medicaments and biological substances status: Secondary | ICD-10-CM | POA: Insufficient documentation

## 2019-01-08 DIAGNOSIS — Z882 Allergy status to sulfonamides status: Secondary | ICD-10-CM | POA: Insufficient documentation

## 2019-01-08 DIAGNOSIS — Z7982 Long term (current) use of aspirin: Secondary | ICD-10-CM | POA: Diagnosis not present

## 2019-01-08 DIAGNOSIS — G43909 Migraine, unspecified, not intractable, without status migrainosus: Secondary | ICD-10-CM | POA: Diagnosis not present

## 2019-01-08 DIAGNOSIS — M069 Rheumatoid arthritis, unspecified: Secondary | ICD-10-CM | POA: Insufficient documentation

## 2019-01-08 DIAGNOSIS — F329 Major depressive disorder, single episode, unspecified: Secondary | ICD-10-CM | POA: Insufficient documentation

## 2019-01-08 DIAGNOSIS — Z833 Family history of diabetes mellitus: Secondary | ICD-10-CM | POA: Diagnosis not present

## 2019-01-08 DIAGNOSIS — K5090 Crohn's disease, unspecified, without complications: Secondary | ICD-10-CM | POA: Insufficient documentation

## 2019-01-08 DIAGNOSIS — Z881 Allergy status to other antibiotic agents status: Secondary | ICD-10-CM | POA: Insufficient documentation

## 2019-01-08 HISTORY — PX: REPAIR EXTENSOR TENDON: SHX5382

## 2019-01-08 SURGERY — REPAIR, TENDON, EXTENSOR
Anesthesia: General | Laterality: Left

## 2019-01-08 MED ORDER — LACTATED RINGERS IV SOLN: INTRAVENOUS | Status: DC

## 2019-01-08 MED ORDER — METOCLOPRAMIDE HCL 5 MG/ML IJ SOLN: 5.0000 mg | Freq: Three times a day (TID) | INTRAMUSCULAR | Status: DC | PRN

## 2019-01-08 MED ORDER — TRAMADOL HCL 50 MG PO TABS
ORAL_TABLET | ORAL | Status: AC
  Filled 2019-01-08: qty 1

## 2019-01-08 MED ORDER — FENTANYL CITRATE (PF) 100 MCG/2ML IJ SOLN
25.0000 ug | INTRAMUSCULAR | Status: DC | PRN
  Administered 2019-01-08 (×2): 25 ug via INTRAVENOUS

## 2019-01-08 MED ORDER — BUPIVACAINE HCL (PF) 0.5 % IJ SOLN
INTRAMUSCULAR | Status: DC | PRN
  Administered 2019-01-08: 10 mL

## 2019-01-08 MED ORDER — MIDAZOLAM HCL 2 MG/2ML IJ SOLN
INTRAMUSCULAR | Status: DC | PRN
  Administered 2019-01-08: 2 mg via INTRAVENOUS

## 2019-01-08 MED ORDER — PHENYLEPHRINE HCL (PRESSORS) 10 MG/ML IV SOLN
INTRAVENOUS | Status: DC | PRN
  Administered 2019-01-08: 200 ug via INTRAVENOUS
  Administered 2019-01-08 (×2): 100 ug via INTRAVENOUS

## 2019-01-08 MED ORDER — ONDANSETRON HCL 4 MG/2ML IJ SOLN: 4.0000 mg | Freq: Four times a day (QID) | INTRAMUSCULAR | Status: DC | PRN

## 2019-01-08 MED ORDER — DEXAMETHASONE SODIUM PHOSPHATE 10 MG/ML IJ SOLN
INTRAMUSCULAR | Status: AC
  Filled 2019-01-08: qty 1

## 2019-01-08 MED ORDER — BUPIVACAINE HCL (PF) 0.5 % IJ SOLN
INTRAMUSCULAR | Status: AC
  Filled 2019-01-08: qty 30

## 2019-01-08 MED ORDER — LIDOCAINE HCL (CARDIAC) PF 100 MG/5ML IV SOSY
PREFILLED_SYRINGE | INTRAVENOUS | Status: DC | PRN
  Administered 2019-01-08: 80 mg via INTRAVENOUS

## 2019-01-08 MED ORDER — FENTANYL CITRATE (PF) 100 MCG/2ML IJ SOLN
INTRAMUSCULAR | Status: AC
  Administered 2019-01-08: 25 ug via INTRAVENOUS
  Filled 2019-01-08: qty 2

## 2019-01-08 MED ORDER — TRAMADOL HCL 50 MG PO TABS
50.0000 mg | ORAL_TABLET | Freq: Once | ORAL | Status: AC | PRN
  Administered 2019-01-08: 50 mg via ORAL
  Filled 2019-01-08: qty 1

## 2019-01-08 MED ORDER — SODIUM CHLORIDE 0.9 % IV SOLN: INTRAVENOUS | Status: DC

## 2019-01-08 MED ORDER — ONDANSETRON HCL 4 MG/2ML IJ SOLN
INTRAMUSCULAR | Status: DC | PRN
  Administered 2019-01-08: 4 mg via INTRAVENOUS

## 2019-01-08 MED ORDER — ONDANSETRON HCL 4 MG PO TABS: 4.0000 mg | ORAL_TABLET | Freq: Four times a day (QID) | ORAL | Status: DC | PRN

## 2019-01-08 MED ORDER — MIDAZOLAM HCL 2 MG/2ML IJ SOLN
INTRAMUSCULAR | Status: AC
  Filled 2019-01-08: qty 2

## 2019-01-08 MED ORDER — PROPOFOL 10 MG/ML IV BOLUS
INTRAVENOUS | Status: DC | PRN
  Administered 2019-01-08: 120 mg via INTRAVENOUS

## 2019-01-08 MED ORDER — PROPOFOL 10 MG/ML IV BOLUS
INTRAVENOUS | Status: AC
  Filled 2019-01-08: qty 20

## 2019-01-08 MED ORDER — ONDANSETRON HCL 4 MG/2ML IJ SOLN
INTRAMUSCULAR | Status: AC
  Filled 2019-01-08: qty 2

## 2019-01-08 MED ORDER — PHENYLEPHRINE HCL (PRESSORS) 10 MG/ML IV SOLN
INTRAVENOUS | Status: AC
  Filled 2019-01-08: qty 1

## 2019-01-08 MED ORDER — FENTANYL CITRATE (PF) 100 MCG/2ML IJ SOLN
INTRAMUSCULAR | Status: AC
  Filled 2019-01-08: qty 2

## 2019-01-08 MED ORDER — CLINDAMYCIN PHOSPHATE 600 MG/50ML IV SOLN
INTRAVENOUS | Status: AC
  Filled 2019-01-08: qty 50

## 2019-01-08 MED ORDER — EPHEDRINE SULFATE 50 MG/ML IJ SOLN
INTRAMUSCULAR | Status: AC
  Filled 2019-01-08: qty 1

## 2019-01-08 MED ORDER — ACETAMINOPHEN 10 MG/ML IV SOLN
INTRAVENOUS | Status: DC | PRN
  Administered 2019-01-08: 1000 mg via INTRAVENOUS

## 2019-01-08 MED ORDER — HYDROMORPHONE HCL 1 MG/ML IJ SOLN
0.2500 mg | INTRAMUSCULAR | Status: DC | PRN
  Administered 2019-01-08: 0.25 mg via INTRAVENOUS

## 2019-01-08 MED ORDER — ACETAMINOPHEN 10 MG/ML IV SOLN
INTRAVENOUS | Status: AC
  Filled 2019-01-08: qty 100

## 2019-01-08 MED ORDER — GENTAMICIN SULFATE 40 MG/ML IJ SOLN
INTRAMUSCULAR | Status: AC
  Filled 2019-01-08: qty 8

## 2019-01-08 MED ORDER — FENTANYL CITRATE (PF) 100 MCG/2ML IJ SOLN
INTRAMUSCULAR | Status: DC | PRN
  Administered 2019-01-08 (×2): 50 ug via INTRAVENOUS

## 2019-01-08 MED ORDER — LACTATED RINGERS IV SOLN
INTRAVENOUS | Status: DC | PRN
  Administered 2019-01-08: 08:00:00 via INTRAVENOUS

## 2019-01-08 MED ORDER — TRAMADOL HCL 50 MG PO TABS
50.0000 mg | ORAL_TABLET | Freq: Four times a day (QID) | ORAL | Status: DC
  Filled 2019-01-08: qty 1

## 2019-01-08 MED ORDER — HYDROMORPHONE HCL 1 MG/ML IJ SOLN
INTRAMUSCULAR | Status: AC
  Filled 2019-01-08: qty 1

## 2019-01-08 MED ORDER — SEVOFLURANE IN SOLN
RESPIRATORY_TRACT | Status: AC
  Filled 2019-01-08: qty 250

## 2019-01-08 MED ORDER — TRAMADOL HCL 50 MG PO TABS: 100.0000 mg | ORAL_TABLET | Freq: Four times a day (QID) | ORAL | 0 refills | Status: AC | PRN

## 2019-01-08 MED ORDER — DEXAMETHASONE SODIUM PHOSPHATE 10 MG/ML IJ SOLN
INTRAMUSCULAR | Status: DC | PRN
  Administered 2019-01-08: 5 mg via INTRAVENOUS

## 2019-01-08 MED ORDER — METOCLOPRAMIDE HCL 10 MG PO TABS: 5.0000 mg | ORAL_TABLET | Freq: Three times a day (TID) | ORAL | Status: DC | PRN

## 2019-01-08 SURGICAL SUPPLY — 43 items
BANDAGE ELASTIC 4 LF NS (GAUZE/BANDAGES/DRESSINGS) ×2 IMPLANT
BNDG ESMARK 4X12 TAN STRL LF (GAUZE/BANDAGES/DRESSINGS) ×2 IMPLANT
CANISTER SUCT 1200ML W/VALVE (MISCELLANEOUS) ×2 IMPLANT
CHLORAPREP W/TINT 26 (MISCELLANEOUS) ×2 IMPLANT
COVER WAND RF STERILE (DRAPES) ×2 IMPLANT
CUFF DUAL TOURNIQUET 18IN DISP (TOURNIQUET CUFF) ×1 IMPLANT
CUFF TOURN 24 STER (MISCELLANEOUS) IMPLANT
ELECT CAUTERY BLADE 6.4 (BLADE) ×2 IMPLANT
ELECT CAUTERY NDL 2.0 MIC (NEEDLE) IMPLANT
ELECT CAUTERY NEEDLE 2.0 MIC (NEEDLE) ×2 IMPLANT
ELECT REM PT RETURN 9FT ADLT (ELECTROSURGICAL) ×2
ELECTRODE REM PT RTRN 9FT ADLT (ELECTROSURGICAL) ×1 IMPLANT
GAUZE SPONGE 4X4 12PLY STRL (GAUZE/BANDAGES/DRESSINGS) ×2 IMPLANT
GAUZE XEROFORM 1X8 LF (GAUZE/BANDAGES/DRESSINGS) ×2 IMPLANT
GLOVE BIOGEL PI IND STRL 9 (GLOVE) ×1 IMPLANT
GLOVE BIOGEL PI INDICATOR 9 (GLOVE)
GLOVE SURG SYN 9.0  PF PI (GLOVE) ×2
GLOVE SURG SYN 9.0 PF PI (GLOVE) ×1 IMPLANT
GOWN SRG 2XL LVL 4 RGLN SLV (GOWNS) ×1 IMPLANT
GOWN STRL NON-REIN 2XL LVL4 (GOWNS) ×1
GOWN STRL REUS W/ TWL LRG LVL3 (GOWN DISPOSABLE) ×1 IMPLANT
GOWN STRL REUS W/TWL LRG LVL3 (GOWN DISPOSABLE) ×1
KIT TURNOVER KIT A (KITS) ×2 IMPLANT
NDL HYPO 25X1 1.5 SAFETY (NEEDLE) ×1 IMPLANT
NEEDLE HYPO 25X1 1.5 SAFETY (NEEDLE) ×2 IMPLANT
NS IRRIG 500ML POUR BTL (IV SOLUTION) ×2 IMPLANT
PACK EXTREMITY ARMC (MISCELLANEOUS) ×2 IMPLANT
PAD CAST CTTN 4X4 STRL (SOFTGOODS) ×1 IMPLANT
PADDING CAST COTTON 4X4 STRL (SOFTGOODS) ×1
SPLINT CAST 1 STEP 3X12 (MISCELLANEOUS) ×1 IMPLANT
SPLINT CAST 1 STEP 4X15 (MISCELLANEOUS) ×1 IMPLANT
STOCKINETTE 48X4 2 PLY STRL (GAUZE/BANDAGES/DRESSINGS) ×1 IMPLANT
STOCKINETTE STRL 4IN 9604848 (GAUZE/BANDAGES/DRESSINGS) ×2 IMPLANT
SUT ETHILON 4 0 P 3 18 (SUTURE) ×4 IMPLANT
SUT ETHILON 4-0 (SUTURE) ×1
SUT ETHILON 4-0 FS2 18XMFL BLK (SUTURE) ×1
SUT ETHILON 5 0 P 3 18 (SUTURE) ×1
SUT MERSILENE 4-0 WHT RB-1 (SUTURE) ×2 IMPLANT
SUT NYLON ETHILON 5-0 P-3 1X18 (SUTURE) ×1 IMPLANT
SUT VIC AB 4-0 P-3 18XBRD (SUTURE) IMPLANT
SUT VIC AB 4-0 P3 18 (SUTURE) ×1
SUTURE ETHLN 4-0 FS2 18XMF BLK (SUTURE) ×1 IMPLANT
SYR 10ML LL (SYRINGE) ×2 IMPLANT

## 2019-01-08 NOTE — Op Note (Signed)
01/08/2019  8:35 AM  PATIENT:  Nicole Bass  68 y.o. female  PRE-OPERATIVE DIAGNOSIS: Left index and long finger sagittal band rupture with tendon subluxation  POST-OPERATIVE DIAGNOSIS: Same   PROCEDURE:  Procedure(s): Realignment  EXTENSOR TENDON (Left) index and long finger  SURGEON: Laurene Footman, MD  ASSISTANTS: None  ANESTHESIA:   general  EBL:  Total I/O In: 300 [I.V.:300] Out: 1 [Blood:1]  BLOOD ADMINISTERED:none  DRAINS: none   LOCAL MEDICATIONS USED:  MARCAINE     SPECIMEN:  No Specimen  DISPOSITION OF SPECIMEN:  N/A  COUNTS:  YES  TOURNIQUET:   Total Tourniquet Time Documented: Upper Arm (Left) - 30 minutes Total: Upper Arm (Left) - 30 minutes   IMPLANTS: None  DICTATION: .Dragon Dictation patient was brought to the operating room and after adequate general anesthesia was obtained, the left arm was prepped and draped in the usual sterile fashion with a tourniquet applied to the upper arm.  After patient identification and timeout procedures were completed, tourniquet was raised.  Radial-based curvilinear incisions were then made over the MCP joint on the index and middle fingers with the subcutaneous tissue spread and hemostasis achieved electrocautery.  The index finger was approached first the with the EIP and EDC tendons dentified with subluxation around the met metacarpal head to the ulnar side.  The sagittal band on the ulnar side was released and this allowed the tendons to be centralized the sagittal band which should ruptured on the radial side was incised and then in a pants over vest fashion repaired shortening the band when this was done the finger could be flexed to 90 degrees with centralization of the tendons there was no excessive radial deviation of the tendons with this being the case the repair was then oversewn with additional 4-0 Vicryl.  The identical procedure was carried out on the long finger with centralization of the tendon after  release of the proximal end of the sagittal band ulnarly and pants over vest repair on the radial side with 90 degrees flexion the both tendons stayed in appropriate position.  10 cc of half percent Sensorcaine was then infiltrated in the proximal incisions to aid in postop analgesia and the wounds closed with running 4-0 nylon skin suture.  Xeroform was applied over the incisions with 4 x 4's between the fingers and on the dorsum of the hand followed by a roll of web roll and a dorsal splint holding the MCP joints in flexion followed by an Ace wrap.  PLAN OF CARE: Discharge to home after PACU  PATIENT DISPOSITION:  PACU - hemodynamically stable.     i

## 2019-01-08 NOTE — Discharge Instructions (Addendum)
Keep arm elevated this week until recheck.  Okay to wiggle fingers will try to bend too much.  Tramadol as needed for pain.  Keep splint and dressing clean and dry.  AMBULATORY SURGERY  DISCHARGE INSTRUCTIONS   1) The drugs that you were given will stay in your system until tomorrow so for the next 24 hours you should not:  A) Drive an automobile B) Make any legal decisions C) Drink any alcoholic beverage   2) You may resume regular meals tomorrow.  Today it is better to start with liquids and gradually work up to solid foods.  You may eat anything you prefer, but it is better to start with liquids, then soup and crackers, and gradually work up to solid foods.   3) Please notify your doctor immediately if you have any unusual bleeding, trouble breathing, redness and pain at the surgery site, drainage, fever, or pain not relieved by medication.    4) Additional Instructions:        Please contact your physician with any problems or Same Day Surgery at [phone], Monday through Friday 6 am to 4 pm, or [place] at [facility] number at [phone].

## 2019-01-08 NOTE — Anesthesia Preprocedure Evaluation (Addendum)
Anesthesia Evaluation  Patient identified by MRN, date of birth, ID band Patient awake    Reviewed: Allergy & Precautions, H&P , NPO status , Patient's Chart, lab work & pertinent test results  History of Anesthesia Complications Negative for: history of anesthetic complications  Airway Mallampati: III  TM Distance: >3 FB Neck ROM: limited    Dental  (+) Upper Dentures   Pulmonary neg pulmonary ROS,           Cardiovascular hypertension, negative cardio ROS       Neuro/Psych  Neuromuscular disease negative psych ROS   GI/Hepatic Neg liver ROS, GERD  Controlled,  Endo/Other  negative endocrine ROS  Renal/GU      Musculoskeletal   Abdominal   Peds  Hematology  (+) Blood dyscrasia, anemia ,   Anesthesia Other Findings Obesity  Past Medical History: No date: Anemia No date: Arthritis     Comment:  rheumatoid No date: Collagen vascular disease (HCC) No date: Crohn's disease (HCC) No date: DDD (degenerative disc disease), cervical No date: DDD (degenerative disc disease), cervical 2003: DDD (degenerative disc disease), cervical No date: DDD (degenerative disc disease), lumbar No date: Degenerative disc disease, cervical No date: GERD (gastroesophageal reflux disease) No date: Hypertension No date: Neuromuscular disorder ([place]) No date: Neuropathy  Past Surgical History: No date: ABDOMINAL HYSTERECTOMY No date: APPENDECTOMY No date: BACK SURGERY     Comment:  x 3 No date: CERVICAL SPINE SURGERY 1976 & 1989: CESAREAN SECTION No date: CHOLECYSTECTOMY 1999: COLON SURGERY     Comment:  removed 18 inches of colon 2004: [place] OF UTERUS 2008: ELBOW FRACTURE SURGERY 2015: JOINT REPLACEMENT; Right     Comment:  partial knee replacement No date: KYPHOPLASTY 04/14/2015: KYPHOPLASTY; N/A     Comment:  Procedure: KYPHOPLASTY;  Surgeon: Hessie Knows, MD;                Location: [facility];   Service: Orthopedics;  Laterality:               N/A; 06/04/2015: KYPHOPLASTY; N/A     Comment:  Procedure: KYPHOPLASTY L-5;  Surgeon: Hessie Knows, MD;               Location: [facility];  Service: Orthopedics;  Laterality:               N/A; No date: MEDIAL PARTIAL KNEE REPLACEMENT No date: OOPHORECTOMY; Bilateral 04/13/2016: SHOULDER ARTHROSCOPY WITH ROTATOR CUFF REPAIR AND  SUBACROMIAL DECOMPRESSION; Right     Comment:  Procedure: SHOULDER ARTHROSCOPY WITH ROTATOR CUFF REPAIR              AND SUBACROMIAL DECOMPRESSION, release of long head               biceps tendon;  Surgeon: Leanor Kail, MD;  Location:               [facility];  Service: Orthopedics;  Laterality: Right; 2013: TOE SURGERY No date: TONSILLECTOMY No date: WRIST FRACTURE SURGERY; Bilateral  BMI    Body Mass Index:  35.90 kg/m      Reproductive/Obstetrics negative OB ROS                            Anesthesia Physical Anesthesia Plan  ASA: III  Anesthesia Plan: General LMA   Post-op Pain Management:    Induction:   PONV Risk Score and Plan: Dexamethasone, Ondansetron, Midazolam and Treatment may vary due  to age or medical condition  Airway Management Planned:   Additional Equipment:   Intra-op Plan:   Post-operative Plan:   Informed Consent: I have reviewed the patients History and Physical, chart, labs and discussed the procedure including the risks, benefits and alternatives for the proposed anesthesia with the patient or authorized representative who has indicated his/her understanding and acceptance.     Dental Advisory Given  Plan Discussed with: Anesthesiologist and CRNA  Anesthesia Plan Comments:         Anesthesia Quick Evaluation

## 2019-01-08 NOTE — Anesthesia Post-op Follow-up Note (Signed)
Anesthesia QCDR form completed.        

## 2019-01-08 NOTE — Transfer of Care (Signed)
Immediate Anesthesia Transfer of Care Note  Patient: Nicole Bass  Procedure(s) Performed: Realignment  EXTENSOR TENDON (Left )  Patient Location: PACU  Anesthesia Type:General  Level of Consciousness: sedated  Airway & Oxygen Therapy: Patient Spontanous Breathing  Post-op Assessment: Report given to RN and Post -op Vital signs reviewed and stable  Post vital signs: Reviewed and stable  Last Vitals:  Vitals Value Taken Time  BP 126/69 01/08/2019  8:35 AM  Temp 36.1 C 01/08/2019  8:35 AM  Pulse 80 01/08/2019  8:39 AM  Resp 19 01/08/2019  8:39 AM  SpO2 98 % 01/08/2019  8:39 AM  Vitals shown include unvalidated device data.  Last Pain:  Vitals:   01/08/19 0835  TempSrc:   PainSc: 0-No pain         Complications: No apparent anesthesia complications

## 2019-01-08 NOTE — H&P (Signed)
Reviewed paper H+P, will be scanned into chart. No changes noted.  

## 2019-01-08 NOTE — Anesthesia Postprocedure Evaluation (Signed)
Anesthesia Post Note  Patient: Nicole Bass  Procedure(s) Performed: Realignment  EXTENSOR TENDON (Left )  Patient location during evaluation: PACU Anesthesia Type: General Level of consciousness: awake and alert Pain management: pain level controlled Vital Signs Assessment: post-procedure vital signs reviewed and stable Respiratory status: spontaneous breathing, nonlabored ventilation and respiratory function stable Cardiovascular status: blood pressure returned to baseline and stable Postop Assessment: no apparent nausea or vomiting Anesthetic complications: no     Last Vitals:  Vitals:   01/08/19 0928 01/08/19 1110  BP: 125/62 109/71  Pulse: 79 73  Resp: 16 17  Temp: 37.2 C 37.1 C  SpO2: 97% 98%    Last Pain:  Vitals:   01/08/19 1110  TempSrc: Temporal  PainSc: 0-No pain                 Durenda Hurt

## 2019-01-08 NOTE — Anesthesia Procedure Notes (Signed)
Procedure Name: LMA Insertion Date/Time: 01/08/2019 7:41 AM Performed by: Justus Memory, CRNA Pre-anesthesia Checklist: Patient identified, Patient being monitored, Timeout performed, Emergency Drugs available and Suction available Patient Re-evaluated:Patient Re-evaluated prior to induction Oxygen Delivery Method: Circle system utilized Preoxygenation: Pre-oxygenation with 100% oxygen Induction Type: IV induction Ventilation: Mask ventilation without difficulty LMA: LMA inserted LMA Size: 4.5 Tube type: Oral Number of attempts: 1 Placement Confirmation: positive ETCO2 and breath sounds checked- equal and bilateral Tube secured with: Tape Dental Injury: Teeth and Oropharynx as per pre-operative assessment

## 2019-01-15 ENCOUNTER — Encounter: Payer: Self-pay | Admitting: Urology

## 2019-01-15 ENCOUNTER — Ambulatory Visit: Payer: Medicare Other | Admitting: Urology

## 2019-01-25 DIAGNOSIS — M12811 Other specific arthropathies, not elsewhere classified, right shoulder: Secondary | ICD-10-CM | POA: Insufficient documentation

## 2019-02-10 NOTE — Progress Notes (Signed)
[place]  [phone] [phone]  ID: Nicole Bass OB: [birth year]  MR#: [id number]  YIR#:[id number]  Patient Care Team: [name], MD as PCP - General (Internal Medicine)  CHIEF COMPLAINT: Iron deficiency anemia.  INTERVAL HISTORY: Patient returns to clinic today for repeat laboratory work and further evaluation.  She continues to have chronic weakness and fatigue.  She also has occasional dyspnea on exertion and mild dizziness upon standing.  She has no other neurologic complaints.  She denies any recent fevers or illnesses.  She has good appetite and denies weight loss.  She denies any chest pain, shortness of breath, cough, or hemoptysis.  She denies any nausea, vomiting, constipation, or diarrhea.  She has no melena or hematochezia.  She has no urinary complaints.  Patient offers no further specific complaints today.  REVIEW OF SYSTEMS:   Review of Systems  Constitutional: Positive for malaise/fatigue. Negative for fever and weight loss.  Respiratory: Negative.  Negative for cough, hemoptysis and shortness of breath.   Cardiovascular: Negative.  Negative for chest pain and leg swelling.  Gastrointestinal: Negative.  Negative for abdominal pain, blood in stool, nausea and vomiting.  Genitourinary: Negative.  Negative for hematuria.  Musculoskeletal: Negative.  Negative for back pain.  Skin: Negative.  Negative for rash.  Neurological: Positive for dizziness and weakness. Negative for focal weakness and headaches.  Psychiatric/Behavioral: Negative.  The patient is not nervous/anxious.     As per HPI. Otherwise, a complete review of systems is negative.  PAST MEDICAL HISTORY: Past Medical History:  Diagnosis Date  . Anemia   . Arthritis    rheumatoid  . Collagen vascular disease ([place])   . Crohn's disease ([place])   . DDD (degenerative disc disease), cervical   . DDD (degenerative disc disease), cervical   . DDD (degenerative  disc disease), cervical 2003  . DDD (degenerative disc disease), lumbar   . Degenerative disc disease, cervical   . GERD (gastroesophageal reflux disease)   . Hypertension   . Neuromuscular disorder ([place])   . Neuropathy     PAST SURGICAL HISTORY: Past Surgical History:  Procedure Laterality Date  . ABDOMINAL HYSTERECTOMY    . APPENDECTOMY    . BACK SURGERY     x 3  . CERVICAL SPINE SURGERY    . [place]  . CHOLECYSTECTOMY    . COLON SURGERY  1999   removed 18 inches of colon  . DILATION AND CURETTAGE OF UTERUS  2004  . ELBOW FRACTURE SURGERY  2008  . JOINT REPLACEMENT Right 2015   partial knee replacement  . KYPHOPLASTY    . KYPHOPLASTY N/A 04/14/2015   Procedure: KYPHOPLASTY;  Surgeon: Hessie Knows, MD;  Location: [facility];  Service: Orthopedics;  Laterality: N/A;  . KYPHOPLASTY N/A 06/04/2015   Procedure: KYPHOPLASTY L-5;  Surgeon: Hessie Knows, MD;  Location: [facility];  Service: Orthopedics;  Laterality: N/A;  . MEDIAL PARTIAL KNEE REPLACEMENT    . OOPHORECTOMY Bilateral   . REPAIR EXTENSOR TENDON Left 01/08/2019   Procedure: Realignment  EXTENSOR TENDON;  Surgeon: Hessie Knows, MD;  Location: [facility];  Service: Orthopedics;  Laterality: Left;  . SHOULDER ARTHROSCOPY WITH ROTATOR CUFF REPAIR AND SUBACROMIAL DECOMPRESSION Right 04/13/2016   Procedure: SHOULDER ARTHROSCOPY WITH ROTATOR CUFF REPAIR AND SUBACROMIAL DECOMPRESSION, release of long head biceps tendon;  Surgeon: Leanor Kail, MD;  Location: [facility];  Service: Orthopedics;  Laterality: Right;  . TOE SURGERY  2013  .  TONSILLECTOMY    . WRIST FRACTURE SURGERY Bilateral     FAMILY HISTORY: Family History  Problem Relation Age of Onset  . Diabetes Mother   . Emphysema Mother   . Emphysema Father   . Colon cancer Maternal Grandfather   . Breast cancer Neg Hx   . Ovarian cancer Neg Hx     ADVANCED DIRECTIVES (Y/N):  N  HEALTH MAINTENANCE: Social History   Tobacco Use  . Smoking  status: Never Smoker  . Smokeless tobacco: Never Used  Substance Use Topics  . Alcohol use: No  . Drug use: No     Colonoscopy:  PAP:  Bone density:  Lipid panel:  Allergies  Allergen Reactions  . Ciprofloxacin Itching and Rash  . Levofloxacin Hives  . Methotrexate Derivatives     Mouth and throat ulcers  . Sulfa Antibiotics Other (See Comments)    Other reaction(s): Other (See Comments), Unknown Other reaction(s): Other (See Comments), Unknown Per pts MD she is unable to take this medication because it is contraindicated with her other medications. Per pts MD she is unable to take this medication because it is contraindicated with her other medications. Other reaction(s): Other (See Comments), Unknown Other reaction(s): Other (See Comments), Unknown Per pts MD she is unable to take this medication because it is contraindicated with her   . Cefazolin Itching and Rash  . Cefprozil Rash  . Cephalosporins Itching and Rash  . Enbrel [Etanercept] Itching and Rash  . Humira [Adalimumab] Itching and Rash  . Hydrocodone-Acetaminophen Itching and Other (See Comments)    Itching only  . Levaquin [Levofloxacin In D5w] Itching and Rash  . Lorabid [Loracarbef] Itching and Rash  . Meropenem Itching and Rash  . Oxycodone Itching and Other (See Comments)  . Oxycodone-Acetaminophen Itching  . Primidone Itching and Rash    Current Outpatient Medications  Medication Sig Dispense Refill  . acetaminophen (TYLENOL 8 HOUR ARTHRITIS PAIN) 650 MG CR tablet Take 1,300 mg by mouth every 8 (eight) hours as needed for pain.    Marland Kitchen alendronate (FOSAMAX) 70 MG tablet Take 70 mg by mouth every Monday. Take with a full glass of water on an empty stomach.     Marland Kitchen amitriptyline (ELAVIL) 10 MG tablet Take 10 mg by mouth at bedtime.    Marland Kitchen aspirin EC 81 MG tablet Take 81 mg by mouth daily.    Marland Kitchen b complex vitamins tablet Take 1 tablet by mouth daily.    . cetirizine (ZYRTEC) 10 MG tablet Take 10 mg by mouth  daily.    . cyanocobalamin (,VITAMIN B-12,) 1000 MCG/ML injection Inject 1,000 mcg into the muscle every 30 (thirty) days.    . diphenoxylate-atropine (LOMOTIL) 2.5-0.025 MG per tablet Take 2 tablets by mouth daily.     . ferrous sulfate 325 (65 FE) MG tablet Take 1 tablet (325 mg total) by mouth 2 (two) times daily with a meal. (Patient taking differently: Take 325 mg by mouth at bedtime. ) 60 tablet 0  . furosemide (LASIX) 20 MG tablet Take 20 mg by mouth daily.     Marland Kitchen gabapentin (NEURONTIN) 400 MG capsule Take 400 mg by mouth at bedtime.    . magnesium oxide (MAG-OX) 400 MG tablet Take 400 mg by mouth every Monday, Wednesday, and Friday. In the morning.    . methimazole (TAPAZOLE) 5 MG tablet Take 2.5 mg by mouth every Monday, Wednesday, and Friday. In the morning    . Multiple Vitamin (MULTIVITAMIN WITH MINERALS) TABS  tablet Take 1 tablet by mouth daily.    Marland Kitchen omeprazole (PRILOSEC) 40 MG capsule Take 40 mg by mouth daily before breakfast.     . potassium chloride SA (K-DUR,KLOR-CON) 20 MEQ tablet Take 20 mEq by mouth every Monday, Wednesday, and Friday. In the morning.    . predniSONE (DELTASONE) 1 MG tablet Take 4 mg by mouth daily with breakfast.    . Probiotic Product (PROBIOTIC PO) Take 1 capsule by mouth daily.     Marland Kitchen rOPINIRole (REQUIP) 1 MG tablet Take 1 mg by mouth at bedtime as needed (for restless legs).     . sucralfate (CARAFATE) 1 g tablet Take 1 g by mouth 3 (three) times daily with meals as needed (for stomach cramping (crohn's disease)).     Marland Kitchen telmisartan (MICARDIS) 40 MG tablet Take 20 mg by mouth every Monday, Wednesday, and Friday. In the morning.    . Tofacitinib Citrate (XELJANZ) 5 MG TABS Take 10 mg by mouth at bedtime.     . traMADol (ULTRAM) 50 MG tablet Take 100 mg by mouth 3 (three) times daily as needed for moderate pain.      No current facility-administered medications for this visit.     OBJECTIVE: Vitals:   02/12/19 1328  BP: 124/78  Pulse: (!) 101  Temp:  98.4 F (36.9 C)     Body mass index is 35.14 kg/m.    ECOG FS:0 - Asymptomatic  General: Well-developed, well-nourished, no acute distress. Eyes: Pink conjunctiva, anicteric sclera. HEENT: Normocephalic, moist mucous membranes. Lungs: Clear to auscultation bilaterally. Heart: Regular rate and rhythm. No rubs, murmurs, or gallops. Abdomen: Soft, nontender, nondistended. No organomegaly noted, normoactive bowel sounds. Musculoskeletal: No edema, cyanosis, or clubbing. Neuro: Alert, answering all questions appropriately. Cranial nerves grossly intact. Skin: No rashes or petechiae noted. Psych: Normal affect.   LAB RESULTS:  Lab Results  Component Value Date   NA 141 08/24/2018   K 4.0 08/24/2018   CL 117 (H) 08/24/2018   CO2 18 (L) 08/24/2018   GLUCOSE 128 (H) 08/24/2018   BUN 18 08/24/2018   CREATININE 1.02 (H) 08/24/2018   CALCIUM 7.6 (L) 08/24/2018   PROT 6.6 08/23/2018   ALBUMIN 3.4 (L) 08/23/2018   AST 27 08/23/2018   ALT 20 08/23/2018   ALKPHOS 58 08/23/2018   BILITOT 0.6 08/23/2018   GFRNONAA 58 (L) 08/24/2018   GFRAA >60 08/24/2018    Lab Results  Component Value Date   WBC 9.6 02/12/2019   NEUTROABS 7.2 02/12/2019   HGB 11.2 (L) 02/12/2019   HCT 34.8 (L) 02/12/2019   MCV 99.7 02/12/2019   PLT 230 02/12/2019   Lab Results  Component Value Date   IRON 103 02/12/2019   TIBC 318 02/12/2019   IRONPCTSAT 32 (H) 02/12/2019   Lab Results  Component Value Date   FERRITIN 799 (H) 02/12/2019     STUDIES: No results found.  ASSESSMENT: Iron deficiency anemia.  PLAN:   1.  Iron deficiency anemia: Although patient has noted no melena or hematochezia, this is likely related to her Crohn's disease.  She reports she gets regular colonoscopies.  Patient's iron stores are within normal limits, but her hemoglobin remains decreased and she is symptomatic.  Will proceed with one infusion of 510 mg IV Feraheme today.  Return to clinic in 3 months with repeat  laboratory work, further evaluation, and continuation of treatment if necessary.   2.  Crohn's disease: Continue follow-up and treatment with GI. 3.  Elevated  ferritin: Likely secondary to acute phase reactant.  Monitor.  I spent a total of 30 minutes face-to-face with the patient of which greater than 50% of the visit was spent in counseling and coordination of care as detailed above.   Patient expressed understanding and was in agreement with this plan. She also understands that She can call clinic at any time with any questions, concerns, or complaints.    Lloyd Huger, MD   02/13/2019 1:24 PM

## 2019-02-12 ENCOUNTER — Inpatient Hospital Stay (HOSPITAL_BASED_OUTPATIENT_CLINIC_OR_DEPARTMENT_OTHER): Payer: Medicare Other | Admitting: Oncology

## 2019-02-12 ENCOUNTER — Inpatient Hospital Stay: Payer: Medicare Other | Attending: Oncology

## 2019-02-12 ENCOUNTER — Other Ambulatory Visit: Payer: Self-pay

## 2019-02-12 ENCOUNTER — Inpatient Hospital Stay: Payer: Medicare Other

## 2019-02-12 ENCOUNTER — Encounter: Payer: Self-pay | Admitting: Oncology

## 2019-02-12 VITALS — BP 124/78 | HR 101 | Temp 98.4°F | Ht 61.0 in | Wt 186.0 lb

## 2019-02-12 VITALS — BP 120/72 | HR 80 | Temp 98.0°F | Resp 18

## 2019-02-12 DIAGNOSIS — D509 Iron deficiency anemia, unspecified: Secondary | ICD-10-CM | POA: Diagnosis not present

## 2019-02-12 DIAGNOSIS — R7989 Other specified abnormal findings of blood chemistry: Secondary | ICD-10-CM

## 2019-02-12 DIAGNOSIS — K5090 Crohn's disease, unspecified, without complications: Secondary | ICD-10-CM | POA: Insufficient documentation

## 2019-02-12 DIAGNOSIS — D649 Anemia, unspecified: Secondary | ICD-10-CM

## 2019-02-12 DIAGNOSIS — Z96651 Presence of right artificial knee joint: Secondary | ICD-10-CM | POA: Insufficient documentation

## 2019-02-12 LAB — IRON AND TIBC
Iron: 103 ug/dL (ref 28–170)
Saturation Ratios: 32 % — ABNORMAL HIGH (ref 10.4–31.8)
TIBC: 318 ug/dL (ref 250–450)
UIBC: 215 ug/dL

## 2019-02-12 LAB — CBC WITH DIFFERENTIAL/PLATELET
Abs Immature Granulocytes: 0.03 10*3/uL (ref 0.00–0.07)
Basophils Absolute: 0.1 10*3/uL (ref 0.0–0.1)
Basophils Relative: 1 %
Eosinophils Absolute: 0.1 10*3/uL (ref 0.0–0.5)
Eosinophils Relative: 1 %
HCT: 34.8 % — ABNORMAL LOW (ref 36.0–46.0)
Hemoglobin: 11.2 g/dL — ABNORMAL LOW (ref 12.0–15.0)
Immature Granulocytes: 0 %
Lymphocytes Relative: 12 %
Lymphs Abs: 1.2 10*3/uL (ref 0.7–4.0)
MCH: 32.1 pg (ref 26.0–34.0)
MCHC: 32.2 g/dL (ref 30.0–36.0)
MCV: 99.7 fL (ref 80.0–100.0)
Monocytes Absolute: 1.1 10*3/uL — ABNORMAL HIGH (ref 0.1–1.0)
Monocytes Relative: 11 %
Neutro Abs: 7.2 10*3/uL (ref 1.7–7.7)
Neutrophils Relative %: 75 %
Platelets: 230 10*3/uL (ref 150–400)
RBC: 3.49 MIL/uL — ABNORMAL LOW (ref 3.87–5.11)
RDW: 12.4 % (ref 11.5–15.5)
WBC: 9.6 10*3/uL (ref 4.0–10.5)
nRBC: 0 % (ref 0.0–0.2)

## 2019-02-12 LAB — FERRITIN: Ferritin: 799 ng/mL — ABNORMAL HIGH (ref 11–307)

## 2019-02-12 MED ORDER — SODIUM CHLORIDE 0.9 % IV SOLN
510.0000 mg | Freq: Once | INTRAVENOUS | Status: AC
  Administered 2019-02-12: 510 mg via INTRAVENOUS
  Filled 2019-02-12: qty 17

## 2019-02-12 MED ORDER — SODIUM CHLORIDE 0.9 % IV SOLN
INTRAVENOUS | Status: DC
  Administered 2019-02-12: 14:00:00 via INTRAVENOUS
  Filled 2019-02-12: qty 250

## 2019-02-12 NOTE — Progress Notes (Signed)
Patient stated that she stays tired, fatigued, dizzy when when she gets up and SOB on exertion.

## 2019-02-19 ENCOUNTER — Other Ambulatory Visit: Payer: Self-pay

## 2019-02-19 DIAGNOSIS — N390 Urinary tract infection, site not specified: Secondary | ICD-10-CM

## 2019-02-19 DIAGNOSIS — R319 Hematuria, unspecified: Secondary | ICD-10-CM

## 2019-02-22 ENCOUNTER — Other Ambulatory Visit: Payer: Self-pay

## 2019-02-22 ENCOUNTER — Ambulatory Visit
Admission: RE | Admit: 2019-02-22 | Discharge: 2019-02-22 | Disposition: A | Discharge status: Home / Self Care | Payer: Medicare Other | Source: Ambulatory Visit | Attending: Urology | Admitting: Urology

## 2019-02-22 ENCOUNTER — Encounter: Payer: Self-pay | Admitting: Urology

## 2019-02-22 ENCOUNTER — Other Ambulatory Visit
Admission: RE | Admit: 2019-02-22 | Discharge: 2019-02-22 | Disposition: A | Discharge status: Home / Self Care | Payer: Medicare Other | Source: Home / Self Care | Attending: Urology | Admitting: Urology

## 2019-02-22 ENCOUNTER — Ambulatory Visit
Admission: RE | Admit: 2019-02-22 | Discharge: 2019-02-22 | Disposition: A | Discharge status: Home / Self Care | Payer: Medicare Other | Attending: Urology | Admitting: Urology

## 2019-02-22 ENCOUNTER — Ambulatory Visit (INDEPENDENT_AMBULATORY_CARE_PROVIDER_SITE_OTHER): Payer: Medicare Other | Admitting: Urology

## 2019-02-22 VITALS — BP 130/67 | HR 96 | Ht 61.0 in | Wt 189.0 lb

## 2019-02-22 DIAGNOSIS — N390 Urinary tract infection, site not specified: Secondary | ICD-10-CM | POA: Diagnosis present

## 2019-02-22 DIAGNOSIS — N393 Stress incontinence (female) (male): Secondary | ICD-10-CM | POA: Diagnosis not present

## 2019-02-22 DIAGNOSIS — N200 Calculus of kidney: Secondary | ICD-10-CM | POA: Diagnosis present

## 2019-02-22 DIAGNOSIS — N941 Unspecified dyspareunia: Secondary | ICD-10-CM

## 2019-02-22 DIAGNOSIS — N952 Postmenopausal atrophic vaginitis: Secondary | ICD-10-CM

## 2019-02-22 LAB — URINALYSIS, COMPLETE (UACMP) WITH MICROSCOPIC
Bilirubin Urine: NEGATIVE
Glucose, UA: NEGATIVE mg/dL
Hgb urine dipstick: NEGATIVE
Ketones, ur: NEGATIVE mg/dL
Nitrite: NEGATIVE
Protein, ur: NEGATIVE mg/dL
Specific Gravity, Urine: 1.015 (ref 1.005–1.030)
WBC, UA: >50 WBC/hpf (ref 0–5)
pH: 6 (ref 5.0–8.0)

## 2019-02-22 LAB — BLADDER SCAN AMB NON-IMAGING

## 2019-02-22 MED ORDER — DIAZEPAM 10 MG PO TABS: 10.0000 mg | ORAL_TABLET | Freq: Every day | ORAL | 0 refills | Status: DC | PRN

## 2019-02-22 NOTE — Progress Notes (Signed)
02/22/2019 10:43 AM   Nicole Bass [birth year] [id number]  Referring provider: [name], MD [facility] [place],  [place] [address]  Chief Complaint  Patient presents with  . Recurrent UTI    New Patient    HPI: Pleasant 68 year old female who presents today to establish care for recurrent urinary tract infections as well as dyspareunia.  She was referred by Dr. Doy Hutching after developing 3 serial urinary tract infections in a row, starting February 2020 with E. coli, again in March and April.  Urinalysis in March and April were highly suspicious for infection with multiple leukocytes and WBCs however the cultures were negative on this occasion.   No history of constipation or diarrhea.  She is not currently taking any UTI prophylaxis.  She does not believe the infections are associated with sexual intercourse but is not 100% sure of this.  She reports her symptoms during these infections included low abdominal pain and pressure with voiding.  She had no overt dysuria fevers or chills.  Today, she is asymptomatic.  She is voiding well.  She does have some mild urinary frequency but this is not bothersome to her.  She has occasional aches when she laughs coughs and sneezes but this is minimal.  Rare history of UTIs prior to this.  As part of her work-up for urinary tract infections, she did have a renal ultrasound indicating a 9 mm echogenic focus in the right kidney likely representing stones.  There is no other hydronephrosis, renal masses or GU abnormalities appreciated.  No personal history of kidney stones.  She reports today that she was formally a patient Dr. Enzo Bi.  She has had dyspareunia since 1982 which was surgically managed with some resection of tissue near her posterior introitus around then.  She had no issues during her second marriage but then was widowed.  She is now on her third marriage and reports that she is had severe  pain with penetration.  Several years ago, she was prescribed topical estrogen cream to be applied to vaginal dilators but is only ever able to accommodate the second smallest dilator.  She has not used these in several years.  She is interested in reestablishing with a gynecologist.  She primarily has discomfort and cramping with penetration.  She does have some anxiety associated with sexual intercourse.   PMH: Past Medical History:  Diagnosis Date  . Anemia   . Arthritis    rheumatoid  . Collagen vascular disease ([place])   . Crohn's disease ([place])   . DDD (degenerative disc disease), cervical   . DDD (degenerative disc disease), cervical   . DDD (degenerative disc disease), cervical 2003  . DDD (degenerative disc disease), lumbar   . Degenerative disc disease, cervical   . GERD (gastroesophageal reflux disease)   . Hypertension   . Neuromuscular disorder ([place])   . Neuropathy     Surgical History: Past Surgical History:  Procedure Laterality Date  . ABDOMINAL HYSTERECTOMY    . APPENDECTOMY    . BACK SURGERY     x 3  . CERVICAL SPINE SURGERY    . [place]  . CHOLECYSTECTOMY    . COLON SURGERY  1999   removed 18 inches of colon  . DILATION AND CURETTAGE OF UTERUS  2004  . ELBOW FRACTURE SURGERY  2008  . JOINT REPLACEMENT Right 2015   partial knee replacement  . KYPHOPLASTY    . KYPHOPLASTY  N/A 04/14/2015   Procedure: KYPHOPLASTY;  Surgeon: Hessie Knows, MD;  Location: [facility] ORS;  Service: Orthopedics;  Laterality: N/A;  . KYPHOPLASTY N/A 06/04/2015   Procedure: KYPHOPLASTY L-5;  Surgeon: Hessie Knows, MD;  Location: [facility] ORS;  Service: Orthopedics;  Laterality: N/A;  . MEDIAL PARTIAL KNEE REPLACEMENT    . OOPHORECTOMY Bilateral   . REPAIR EXTENSOR TENDON Left 01/08/2019   Procedure: Realignment  EXTENSOR TENDON;  Surgeon: Hessie Knows, MD;  Location: [facility] ORS;  Service: Orthopedics;  Laterality: Left;  . SHOULDER ARTHROSCOPY WITH ROTATOR CUFF REPAIR AND  SUBACROMIAL DECOMPRESSION Right 04/13/2016   Procedure: SHOULDER ARTHROSCOPY WITH ROTATOR CUFF REPAIR AND SUBACROMIAL DECOMPRESSION, release of long head biceps tendon;  Surgeon: Leanor Kail, MD;  Location: [facility] ORS;  Service: Orthopedics;  Laterality: Right;  . TOE SURGERY  2013  . TONSILLECTOMY    . WRIST FRACTURE SURGERY Bilateral     Home Medications:  Allergies as of 02/22/2019      Reactions   Ciprofloxacin Itching, Rash   Levofloxacin Hives   Methotrexate Derivatives    Mouth and throat ulcers   Sulfa Antibiotics Other (See Comments)   Other reaction(s): Other (See Comments), Unknown Other reaction(s): Other (See Comments), Unknown Per pts MD she is unable to take this medication because it is contraindicated with her other medications. Per pts MD she is unable to take this medication because it is contraindicated with her other medications. Other reaction(s): Other (See Comments), Unknown Other reaction(s): Other (See Comments), Unknown Per pts MD she is unable to take this medication because it is contraindicated with her  Other reaction(s): Other (See Comments), Unknown Other reaction(s): Other (See Comments), Unknown Per pts MD she is unable to take this medication because it is contraindicated with her other medications. Per pts MD she is unable to take this medication because it is contraindicated with her other medications.  Other reaction(s): Other (See Comments), Unknown Other reaction(s): Other (See Comments), Unknown Per pts MD she is unable to take this medication because it is contraindicated with her other medications. Per pts MD she is unable to take this medication because it is contraindicated with her other medications. Other reaction(s): Other (See Comments), Unknown Other reaction(s): Other (See Comments), Unknown Per pts MD she is unable to take this medication because it is contraindicated with her    Cefazolin Itching, Rash   Cefprozil Rash    Cephalosporins Itching, Rash   Enbrel [etanercept] Itching, Rash   Humira [adalimumab] Itching, Rash   Hydrocodone-acetaminophen Itching, Other (See Comments)   Itching only   Levaquin [levofloxacin In D5w] Itching, Rash   Lorabid [loracarbef] Itching, Rash   Meropenem Itching, Rash   Oxycodone Itching, Other (See Comments)   Oxycodone-acetaminophen Itching   Primidone Itching, Rash      Medication List       Accurate as of February 22, 2019 11:59 PM. If you have any questions, ask your nurse or doctor.        alendronate 70 MG tablet Commonly known as: FOSAMAX Take 70 mg by mouth every Monday. Take with a full glass of water on an empty stomach.   amitriptyline 10 MG tablet Commonly known as: ELAVIL Take 10 mg by mouth at bedtime.   aspirin EC 81 MG tablet Take 81 mg by mouth daily.   b complex vitamins tablet Take 1 tablet by mouth daily.   cetirizine 10 MG tablet Commonly known as: ZYRTEC Take 10 mg by mouth daily.   cyanocobalamin 1000  MCG/ML injection Commonly known as: (VITAMIN B-12) Inject 1,000 mcg into the muscle every 30 (thirty) days.   diazepam 10 MG tablet Commonly known as: Valium Take 1 tablet (10 mg total) by mouth daily as needed (vaginal pain with intercourse, use before sexual activity per vagina). Started by: Hollice Espy, MD   diphenoxylate-atropine 2.5-0.025 MG tablet Commonly known as: LOMOTIL Take 2 tablets by mouth daily.   ferrous sulfate 325 (65 FE) MG tablet Take 1 tablet (325 mg total) by mouth 2 (two) times daily with a meal. What changed: when to take this   furosemide 20 MG tablet Commonly known as: LASIX Take 20 mg by mouth daily.   gabapentin 400 MG capsule Commonly known as: NEURONTIN Take 400 mg by mouth at bedtime.   magnesium oxide 400 MG tablet Commonly known as: MAG-OX Take 400 mg by mouth every Monday, Wednesday, and Friday. In the morning.   methimazole 5 MG tablet Commonly known as: TAPAZOLE Take 2.5 mg  by mouth every Monday, Wednesday, and Friday. In the morning   multivitamin with minerals Tabs tablet Take 1 tablet by mouth daily.   omeprazole 40 MG capsule Commonly known as: PRILOSEC Take 40 mg by mouth daily before breakfast.   potassium chloride SA 20 MEQ tablet Commonly known as: K-DUR Take 20 mEq by mouth every Monday, Wednesday, and Friday. In the morning.   predniSONE 1 MG tablet Commonly known as: DELTASONE Take 4 mg by mouth daily with breakfast.   PROBIOTIC PO Take 1 capsule by mouth daily.   rOPINIRole 1 MG tablet Commonly known as: REQUIP Take 1 mg by mouth at bedtime as needed (for restless legs).   sucralfate 1 g tablet Commonly known as: CARAFATE Take 1 g by mouth 3 (three) times daily with meals as needed (for stomach cramping (crohn's disease)).   telmisartan 40 MG tablet Commonly known as: MICARDIS Take 20 mg by mouth every Monday, Wednesday, and Friday. In the morning.   traMADol 50 MG tablet Commonly known as: ULTRAM Take 100 mg by mouth 3 (three) times daily as needed for moderate pain.   Tylenol 8 Hour Arthritis Pain 650 MG CR tablet Generic drug: acetaminophen Take 1,300 mg by mouth every 8 (eight) hours as needed for pain.   Xeljanz 5 MG Tabs Generic drug: Tofacitinib Citrate Take 10 mg by mouth at bedtime.       Allergies:  Allergies  Allergen Reactions  . Ciprofloxacin Itching and Rash  . Levofloxacin Hives  . Methotrexate Derivatives     Mouth and throat ulcers  . Sulfa Antibiotics Other (See Comments)    Other reaction(s): Other (See Comments), Unknown Other reaction(s): Other (See Comments), Unknown Per pts MD she is unable to take this medication because it is contraindicated with her other medications. Per pts MD she is unable to take this medication because it is contraindicated with her other medications. Other reaction(s): Other (See Comments), Unknown Other reaction(s): Other (See Comments), Unknown Per pts MD she  is unable to take this medication because it is contraindicated with her  Other reaction(s): Other (See Comments), Unknown Other reaction(s): Other (See Comments), Unknown Per pts MD she is unable to take this medication because it is contraindicated with her other medications. Per pts MD she is unable to take this medication because it is contraindicated with her other medications.  Other reaction(s): Other (See Comments), Unknown Other reaction(s): Other (See Comments), Unknown Per pts MD she is unable to take this medication because it  is contraindicated with her other medications. Per pts MD she is unable to take this medication because it is contraindicated with her other medications. Other reaction(s): Other (See Comments), Unknown Other reaction(s): Other (See Comments), Unknown Per pts MD she is unable to take this medication because it is contraindicated with her   . Cefazolin Itching and Rash  . Cefprozil Rash  . Cephalosporins Itching and Rash  . Enbrel [Etanercept] Itching and Rash  . Humira [Adalimumab] Itching and Rash  . Hydrocodone-Acetaminophen Itching and Other (See Comments)    Itching only  . Levaquin [Levofloxacin In D5w] Itching and Rash  . Lorabid [Loracarbef] Itching and Rash  . Meropenem Itching and Rash  . Oxycodone Itching and Other (See Comments)  . Oxycodone-Acetaminophen Itching  . Primidone Itching and Rash    Family History: Family History  Problem Relation Age of Onset  . Diabetes Mother   . Emphysema Mother   . Emphysema Father   . Colon cancer Maternal Grandfather   . Breast cancer Neg Hx   . Ovarian cancer Neg Hx     Social History:  reports that she has never smoked. She has never used smokeless tobacco. She reports that she does not drink alcohol or use drugs.  ROS: UROLOGY Frequent Urination?: Yes Hard to postpone urination?: Yes Burning/pain with urination?: No Get up at night to urinate?: Yes Leakage of urine?: Yes Urine  stream starts and stops?: No Trouble starting stream?: No Do you have to strain to urinate?: No Blood in urine?: No Urinary tract infection?: No Sexually transmitted disease?: No Injury to kidneys or bladder?: No Painful intercourse?: Yes Weak stream?: No Currently pregnant?: No Vaginal bleeding?: No Last menstrual period?: n  Gastrointestinal Nausea?: No Vomiting?: No Indigestion/heartburn?: No Diarrhea?: No Constipation?: No  Constitutional Fever: No Night sweats?: No Weight loss?: No Fatigue?: Yes  Skin Skin rash/lesions?: No Itching?: No  Eyes Blurred vision?: No Double vision?: No  Ears/Nose/Throat Sore throat?: No Sinus problems?: No  Hematologic/Lymphatic Swollen glands?: No Easy bruising?: No  Cardiovascular Leg swelling?: No Chest pain?: No  Respiratory Cough?: No Shortness of breath?: Yes  Endocrine Excessive thirst?: No  Musculoskeletal Back pain?: Yes Joint pain?: Yes  Neurological Headaches?: No Dizziness?: No  Psychologic Depression?: No Anxiety?: No  Physical Exam: BP 130/67   Pulse 96   Ht 5' 1"  (1.549 m)   Wt 189 lb (85.7 kg)   BMI 35.71 kg/m   Constitutional:  Alert and oriented, No acute distress. HEENT: [place] AT, moist mucus membranes.  Trachea midline, no masses. Cardiovascular: No clubbing, cyanosis, or edema. Respiratory: Normal respiratory effort, no increased work of breathing. GI: Abdomen is soft, nontender, nondistended, no abdominal masses Pelvic: Chaperoned by Fonnie Jarvis, CMA.  Normal external genitalia.  Atrophic vaginitis with atrophic mucosa surrounding urethral meatus.  Hypermobility with Valsalva of the urethral meatus.  No significant pelvic organ prolapse with Valsalva.  Vaginal canal able to accommodate smaller plastic speculum without difficulty.  Slight tenderness to palpation at inferior portion of introitus without fissure.  Bimanual exam unremarkable. Skin: No rashes, bruises or suspicious lesions.  Neurologic: Grossly intact, no focal deficits, moving all 4 extremities. Psychiatric: Normal mood and affect.  Laboratory Data: Lab Results  Component Value Date   WBC 9.6 02/12/2019   HGB 11.2 (L) 02/12/2019   HCT 34.8 (L) 02/12/2019   MCV 99.7 02/12/2019   PLT 230 02/12/2019    Lab Results  Component Value Date   CREATININE 1.02 (H) 08/24/2018  Urinalysis Results for orders placed or performed during the hospital encounter of 02/22/19  Urinalysis, Complete w Microscopic (For [facility] ONLY)  Result Value Ref Range   Color, Urine YELLOW YELLOW   APPearance CLOUDY (A) CLEAR   Specific Gravity, Urine 1.015 1.005 - 1.030   pH 6.0 5.0 - 8.0   Glucose, UA NEGATIVE NEGATIVE mg/dL   Hgb urine dipstick NEGATIVE NEGATIVE   Bilirubin Urine NEGATIVE NEGATIVE   Ketones, ur NEGATIVE NEGATIVE mg/dL   Protein, ur NEGATIVE NEGATIVE mg/dL   Nitrite NEGATIVE NEGATIVE   Leukocytes,Ua LARGE (A) NEGATIVE   Squamous Epithelial / LPF 0-5 0 - 5   WBC, UA >50 0 - 5 WBC/hpf   RBC / HPF 0-5 0 - 5 RBC/hpf   Bacteria, UA FEW (A) NONE SEEN     Pertinent Imaging: Results for orders placed during the hospital encounter of 12/05/18  US RENAL   Narrative CLINICAL DATA:  68 year old female with a history of urinary tract infection  EXAM: RENAL / URINARY TRACT ULTRASOUND COMPLETE  COMPARISON:  None.  FINDINGS: Right Kidney:  Length: 11.3 cm x 4.4 cm x 4.2 cm, 109 cc. No evidence of hydronephrosis. Flow confirmed in the hilum of the right kidney. Echogenic focus at the interpolar collecting system measuring 9 mm.  Left Kidney:  Length: 11.7 cm x 4.2 cm x 5.1 cm, 132 cc. Echogenicity within normal limits with no hydronephrosis. Flow confirmed in the hilum of the left kidney.  Bladder:  Appears normal for degree of bladder distention.  IMPRESSION: No evidence of hydronephrosis.  9 mm echogenic focus of the right kidney, likely nonobstructive stone.   Electronically Signed    By: Corrie Mckusick D.O.   On: 12/05/2018 14:33    Renal ultrasound personally reviewed.  Agree with radiologic interpretation.    PVR 120 cc  Assessment & Plan:    1. Recurrent UTI UTI x3  Mildly elevated PVR today although has not voided in over an hour.  We will continue to follow this.  Recommend timed voiding double void.  Vaginal atrophy appreciated, recommend topical estrogen cream at least 3 times per week to help restore integrity of the periurethral tissue.  In addition to this, would recommend cranberry tablets and probiotics.  - BLADDER SCAN AMB NON-IMAGING  2. Kidney stones Incidental stone on renal ultrasound, 9 mm.  Asymptomatic.  Recommend KUB today to better assess size and location of the stone.  Nanoliters is fairly large and may require intervention versus surveillance.  She is agreeable this plan.  We will call her with results.  If the stone is small, she like to follow it which is quite reasonable.  Stones likely unrelated to recent UTIs.  - DG Abd 1 View; Future  3. Stress incontinence of urine Mild stress urinary incontinence  We discussed management of this including weight loss, pelvic floor exercises (which she is willing to start) and surgical management  She is interested in conservative treatment at this time  4. Dyspareunia, female No evidence of significant vaginal stenosis although treated for this in the past  Suspect some degree of vaginismus and vaginal atrophy contributing to her discomfort.  I recommended a trial of vaginal Valium to be inserted 1 to 2 hours prior to sexual activity.  Precautions of systemic absorption discussed, would prefer she try this in the evening, avoid driving until she knows how it is going to affect her.  If she is not able to tolerate the tablet, will try to  arrange for compounded formulation.  If this fails, we consider pelvic floor therapy which she is not interested in.  She would also like to follow-up  with OB/GYN.  5. Atrophic vaginitis Resume topical estrogen cream 3 times per week   Hollice Espy, MD  [facility] [address] [place], [place] [address] [phone]  I spent 60 min with this patient of which greater than 50% was spent in counseling and coordination of care with the patient.

## 2019-02-25 ENCOUNTER — Telehealth: Payer: Self-pay

## 2019-02-25 NOTE — Telephone Encounter (Signed)
-----   Message from Hollice Espy, MD sent at 02/25/2019  9:07 AM EDT ----- Please let this patient know that she has stones in both kidneys.  These measure about 5 to 6 mm each.  She could consider treatment prior to passing them or we could follow him conservatively with x-ray in 1 year.  If she is interested in treating the stones, she can come back in to discuss this.  If not, please arrange for follow-up visit in 1 year with KUB or sooner if she has any UTI symptoms.  Hollice Espy, MD

## 2019-02-25 NOTE — Telephone Encounter (Signed)
Called pt informed her of the information below. Lengthy discussion had with patient regarding stone care, stone prevention, and follow up. Pt ultimately decided that she would like to think about her options discuss with family and get back to [place] with her decision.

## 2019-03-06 ENCOUNTER — Other Ambulatory Visit: Payer: Self-pay | Admitting: Internal Medicine

## 2019-03-06 DIAGNOSIS — Z1231 Encounter for screening mammogram for malignant neoplasm of breast: Secondary | ICD-10-CM

## 2019-04-08 ENCOUNTER — Ambulatory Visit
Admission: RE | Admit: 2019-04-08 | Discharge: 2019-04-08 | Disposition: A | Discharge status: Home / Self Care | Payer: Medicare Other | Source: Ambulatory Visit | Attending: Internal Medicine | Admitting: Internal Medicine

## 2019-04-08 ENCOUNTER — Other Ambulatory Visit: Payer: Self-pay

## 2019-04-08 DIAGNOSIS — Z1231 Encounter for screening mammogram for malignant neoplasm of breast: Secondary | ICD-10-CM | POA: Insufficient documentation

## 2019-04-12 ENCOUNTER — Encounter
Admission: RE | Admit: 2019-04-12 | Discharge: 2019-04-12 | Disposition: A | Discharge status: Home / Self Care | Payer: Medicare Other | Source: Ambulatory Visit | Attending: Surgery | Admitting: Surgery

## 2019-04-12 ENCOUNTER — Other Ambulatory Visit: Payer: Self-pay

## 2019-04-12 DIAGNOSIS — I10 Essential (primary) hypertension: Secondary | ICD-10-CM | POA: Insufficient documentation

## 2019-04-12 DIAGNOSIS — Z20828 Contact with and (suspected) exposure to other viral communicable diseases: Secondary | ICD-10-CM | POA: Insufficient documentation

## 2019-04-12 DIAGNOSIS — Z01818 Encounter for other preprocedural examination: Secondary | ICD-10-CM | POA: Diagnosis not present

## 2019-04-12 LAB — URINALYSIS, ROUTINE W REFLEX MICROSCOPIC
Bacteria, UA: NONE SEEN
Bilirubin Urine: NEGATIVE
Glucose, UA: NEGATIVE mg/dL
Hgb urine dipstick: NEGATIVE
Ketones, ur: NEGATIVE mg/dL
Nitrite: NEGATIVE
Protein, ur: NEGATIVE mg/dL
Specific Gravity, Urine: 1.023 (ref 1.005–1.030)
Squamous Epithelial / HPF: NONE SEEN (ref 0–5)
WBC, UA: >50 WBC/hpf — ABNORMAL HIGH (ref 0–5)
pH: 5 (ref 5.0–8.0)

## 2019-04-12 LAB — PROTIME-INR
INR: 0.9 (ref 0.8–1.2)
Prothrombin Time: 12 seconds (ref 11.4–15.2)

## 2019-04-12 LAB — TYPE AND SCREEN
ABO/RH(D): A POS
Antibody Screen: NEGATIVE

## 2019-04-12 LAB — CBC
HCT: 35.3 % — ABNORMAL LOW (ref 36.0–46.0)
Hemoglobin: 11.5 g/dL — ABNORMAL LOW (ref 12.0–15.0)
MCH: 32.1 pg (ref 26.0–34.0)
MCHC: 32.6 g/dL (ref 30.0–36.0)
MCV: 98.6 fL (ref 80.0–100.0)
Platelets: 308 10*3/uL (ref 150–400)
RBC: 3.58 MIL/uL — ABNORMAL LOW (ref 3.87–5.11)
RDW: 12.3 % (ref 11.5–15.5)
WBC: 8.6 10*3/uL (ref 4.0–10.5)
nRBC: 0 % (ref 0.0–0.2)

## 2019-04-12 LAB — BASIC METABOLIC PANEL
Anion gap: 12 (ref 5–15)
BUN: 29 mg/dL — ABNORMAL HIGH (ref 8–23)
CO2: 25 mmol/L (ref 22–32)
Calcium: 9.9 mg/dL (ref 8.9–10.3)
Chloride: 102 mmol/L (ref 98–111)
Creatinine, Ser: 1.08 mg/dL — ABNORMAL HIGH (ref 0.44–1.00)
GFR calc Af Amer: >60 mL/min (ref >60)
GFR calc non Af Amer: 53 mL/min — ABNORMAL LOW (ref >60)
Glucose, Bld: 89 mg/dL (ref 70–99)
Potassium: 3.7 mmol/L (ref 3.5–5.1)
Sodium: 139 mmol/L (ref 135–145)

## 2019-04-12 LAB — APTT: aPTT: 26 seconds (ref 24–36)

## 2019-04-12 LAB — SURGICAL PCR SCREEN
MRSA, PCR: NEGATIVE
Staphylococcus aureus: POSITIVE — AB

## 2019-04-12 NOTE — Patient Instructions (Signed)
Your procedure is scheduled on: Thursday 04/18/19 Report to [place]. To find out your arrival time please call [phone] between 1PM - 3PM on Wednesday 04/17/19.  Remember: Instructions that are not followed completely may result in serious medical risk, up to and including death, or upon the discretion of your surgeon and anesthesiologist your surgery may need to be rescheduled.     _X__ 1. Do not eat food after midnight the night before your procedure.                 No gum chewing or hard candies. You may drink clear liquids up to 2 hours                 before you are scheduled to arrive for your surgery- DO not drink clear                 liquids within 2 hours of the start of your surgery.                 Clear Liquids include:  water, apple juice without pulp, clear carbohydrate                 drink such as Clearfast or Gatorade, Black Coffee or Tea (Do not add                 anything to coffee or tea). Diabetics water only  __X__2.  On the morning of surgery brush your teeth with toothpaste and water, you                 may rinse your mouth with mouthwash if you wish.  Do not swallow any              toothpaste of mouthwash.     _X__ 3.  No Alcohol for 24 hours before or after surgery.   _X__ 4.  Do Not Smoke or use e-cigarettes For 24 Hours Prior to Your Surgery.                 Do not use any chewable tobacco products for at least 6 hours prior to                 surgery.  ____  5.  Bring all medications with you on the day of surgery if instructed.   __X__  6.  Notify your doctor if there is any change in your medical condition      (cold, fever, infections).     Do not wear jewelry, make-up, hairpins, clips or nail polish. Do not wear lotions, powders, or perfumes.  Do not shave 48 hours prior to surgery. Men may shave face and neck. Do not bring valuables to the hospital.    [facility] is not responsible for  any belongings or valuables.  Contacts, dentures/partials or body piercings may not be worn into surgery. Bring a case for your contacts, glasses or hearing aids, a denture cup will be supplied. Leave your suitcase in the car. After surgery it may be brought to your room. For patients admitted to the hospital, discharge time is determined by your treatment team.   Patients discharged the day of surgery will not be allowed to drive home.   Please read over the following fact sheets that you were given:   MRSA Information  __X__ Take these medicines the morning of surgery with A SIP OF WATER:  1. omeprazole (PRILOSEC  2. predniSONE (DELTASONE  3.   4.  5.  6.  ____ Fleet Enema (as directed)   __X__ Use CHG Soap/SAGE wipes as directed  ____ Use inhalers on the day of surgery  ____ Stop metformin/Janumet/Farxiga 2 days prior to surgery    ____ Take 1/2 of usual insulin dose the night before surgery. No insulin the morning          of surgery.   __X__ Stop Blood Thinners Coumadin/Plavix/Xarelto/Pleta/Pradaxa/Eliquis/Effient/Aspirin  on   Or contact your Surgeon, Cardiologist or Medical Doctor regarding  ability to stop your blood thinners  __X__ Stop Anti-inflammatories 7 days before surgery such as Advil, Ibuprofen, Motrin,  BC or Goodies Powder, Naprosyn, Naproxen, Aleve, Aspirin    __X__ Stop all herbal supplements, fish oil or vitamin E until after surgery.    ____ Bring C-Pap to the hospital.

## 2019-04-12 NOTE — Pre-Procedure Instructions (Signed)
Ancef alerts noted. Dr Roland Rack aware. No changes to order.

## 2019-04-13 LAB — URINE CULTURE: Culture: <10000 — AB

## 2019-04-15 ENCOUNTER — Other Ambulatory Visit: Payer: Self-pay

## 2019-04-15 ENCOUNTER — Other Ambulatory Visit
Admission: RE | Admit: 2019-04-15 | Discharge: 2019-04-15 | Disposition: A | Discharge status: Home / Self Care | Payer: Medicare Other | Source: Ambulatory Visit | Attending: Surgery | Admitting: Surgery

## 2019-04-15 DIAGNOSIS — Z01818 Encounter for other preprocedural examination: Secondary | ICD-10-CM | POA: Diagnosis not present

## 2019-04-16 LAB — SARS CORONAVIRUS 2 (TAT 6-24 HRS): SARS Coronavirus 2: NEGATIVE

## 2019-04-18 ENCOUNTER — Encounter: Payer: Self-pay | Admitting: Emergency Medicine

## 2019-04-18 ENCOUNTER — Inpatient Hospital Stay
Admission: RE | Admit: 2019-04-18 | Discharge: 2019-04-19 | DRG: 483 | Disposition: A | Discharge status: Home / Self Care | Payer: Medicare Other | Attending: Surgery | Admitting: Surgery

## 2019-04-18 ENCOUNTER — Other Ambulatory Visit: Payer: Self-pay

## 2019-04-18 ENCOUNTER — Inpatient Hospital Stay: Payer: Medicare Other

## 2019-04-18 ENCOUNTER — Inpatient Hospital Stay: Payer: Medicare Other | Admitting: Anesthesiology

## 2019-04-18 ENCOUNTER — Encounter
Admission: RE | Disposition: A | Discharge status: Home / Self Care | Payer: Self-pay | Source: Home / Self Care | Attending: Surgery

## 2019-04-18 DIAGNOSIS — Z87442 Personal history of urinary calculi: Secondary | ICD-10-CM

## 2019-04-18 DIAGNOSIS — Z888 Allergy status to other drugs, medicaments and biological substances status: Secondary | ICD-10-CM | POA: Diagnosis not present

## 2019-04-18 DIAGNOSIS — Z885 Allergy status to narcotic agent status: Secondary | ICD-10-CM

## 2019-04-18 DIAGNOSIS — K219 Gastro-esophageal reflux disease without esophagitis: Secondary | ICD-10-CM | POA: Diagnosis present

## 2019-04-18 DIAGNOSIS — M069 Rheumatoid arthritis, unspecified: Secondary | ICD-10-CM | POA: Diagnosis present

## 2019-04-18 DIAGNOSIS — M5030 Other cervical disc degeneration, unspecified cervical region: Secondary | ICD-10-CM | POA: Diagnosis present

## 2019-04-18 DIAGNOSIS — Z825 Family history of asthma and other chronic lower respiratory diseases: Secondary | ICD-10-CM

## 2019-04-18 DIAGNOSIS — I2510 Atherosclerotic heart disease of native coronary artery without angina pectoris: Secondary | ICD-10-CM | POA: Diagnosis present

## 2019-04-18 DIAGNOSIS — K5090 Crohn's disease, unspecified, without complications: Secondary | ICD-10-CM | POA: Diagnosis present

## 2019-04-18 DIAGNOSIS — M5136 Other intervertebral disc degeneration, lumbar region: Secondary | ICD-10-CM | POA: Diagnosis present

## 2019-04-18 DIAGNOSIS — Z79891 Long term (current) use of opiate analgesic: Secondary | ICD-10-CM

## 2019-04-18 DIAGNOSIS — Z96651 Presence of right artificial knee joint: Secondary | ICD-10-CM | POA: Diagnosis present

## 2019-04-18 DIAGNOSIS — Z79899 Other long term (current) drug therapy: Secondary | ICD-10-CM | POA: Diagnosis not present

## 2019-04-18 DIAGNOSIS — Z90710 Acquired absence of both cervix and uterus: Secondary | ICD-10-CM

## 2019-04-18 DIAGNOSIS — I10 Essential (primary) hypertension: Secondary | ICD-10-CM | POA: Diagnosis present

## 2019-04-18 DIAGNOSIS — M75101 Unspecified rotator cuff tear or rupture of right shoulder, not specified as traumatic: Principal | ICD-10-CM | POA: Diagnosis present

## 2019-04-18 DIAGNOSIS — E0590 Thyrotoxicosis, unspecified without thyrotoxic crisis or storm: Secondary | ICD-10-CM | POA: Diagnosis present

## 2019-04-18 DIAGNOSIS — Z820 Family history of epilepsy and other diseases of the nervous system: Secondary | ICD-10-CM

## 2019-04-18 DIAGNOSIS — Z96611 Presence of right artificial shoulder joint: Secondary | ICD-10-CM

## 2019-04-18 DIAGNOSIS — Z881 Allergy status to other antibiotic agents status: Secondary | ICD-10-CM | POA: Diagnosis not present

## 2019-04-18 DIAGNOSIS — Z791 Long term (current) use of non-steroidal anti-inflammatories (NSAID): Secondary | ICD-10-CM

## 2019-04-18 DIAGNOSIS — F329 Major depressive disorder, single episode, unspecified: Secondary | ICD-10-CM | POA: Diagnosis present

## 2019-04-18 DIAGNOSIS — Z832 Family history of diseases of the blood and blood-forming organs and certain disorders involving the immune mechanism: Secondary | ICD-10-CM

## 2019-04-18 DIAGNOSIS — Z833 Family history of diabetes mellitus: Secondary | ICD-10-CM

## 2019-04-18 DIAGNOSIS — Z800 Family history of malignant neoplasm of digestive organs: Secondary | ICD-10-CM

## 2019-04-18 DIAGNOSIS — Z7982 Long term (current) use of aspirin: Secondary | ICD-10-CM

## 2019-04-18 DIAGNOSIS — M810 Age-related osteoporosis without current pathological fracture: Secondary | ICD-10-CM | POA: Diagnosis present

## 2019-04-18 DIAGNOSIS — Z9049 Acquired absence of other specified parts of digestive tract: Secondary | ICD-10-CM

## 2019-04-18 DIAGNOSIS — K760 Fatty (change of) liver, not elsewhere classified: Secondary | ICD-10-CM | POA: Diagnosis present

## 2019-04-18 DIAGNOSIS — Z882 Allergy status to sulfonamides status: Secondary | ICD-10-CM

## 2019-04-18 DIAGNOSIS — Z8249 Family history of ischemic heart disease and other diseases of the circulatory system: Secondary | ICD-10-CM

## 2019-04-18 DIAGNOSIS — Z8261 Family history of arthritis: Secondary | ICD-10-CM

## 2019-04-18 HISTORY — PX: REVERSE SHOULDER ARTHROPLASTY: SHX5054

## 2019-04-18 LAB — SEDIMENTATION RATE: Sed Rate: 63 mm/hr — ABNORMAL HIGH (ref 0–30)

## 2019-04-18 LAB — ABO/RH: ABO/RH(D): A POS

## 2019-04-18 SURGERY — ARTHROPLASTY, SHOULDER, TOTAL, REVERSE
Anesthesia: General | Site: Shoulder | Laterality: Right

## 2019-04-18 MED ORDER — CEFAZOLIN SODIUM-DEXTROSE 2-4 GM/100ML-% IV SOLN: 2.0000 g | Freq: Once | INTRAVENOUS | Status: DC

## 2019-04-18 MED ORDER — PROPOFOL 10 MG/ML IV BOLUS
INTRAVENOUS | Status: DC | PRN
  Administered 2019-04-18: 130 mg via INTRAVENOUS

## 2019-04-18 MED ORDER — PANTOPRAZOLE SODIUM 40 MG PO TBEC
40.0000 mg | DELAYED_RELEASE_TABLET | Freq: Every day | ORAL | Status: DC
  Administered 2019-04-18 – 2019-04-19 (×2): 40 mg via ORAL
  Filled 2019-04-18 (×2): qty 1

## 2019-04-18 MED ORDER — FENTANYL CITRATE (PF) 100 MCG/2ML IJ SOLN
INTRAMUSCULAR | Status: AC
  Filled 2019-04-18: qty 2

## 2019-04-18 MED ORDER — BUPIVACAINE-EPINEPHRINE (PF) 0.5% -1:200000 IJ SOLN
INTRAMUSCULAR | Status: DC | PRN
  Administered 2019-04-18: 30 mL via PERINEURAL

## 2019-04-18 MED ORDER — FENTANYL CITRATE (PF) 100 MCG/2ML IJ SOLN
INTRAMUSCULAR | Status: AC
  Administered 2019-04-18: 50 ug via INTRAVENOUS
  Filled 2019-04-18: qty 2

## 2019-04-18 MED ORDER — VANCOMYCIN HCL IN DEXTROSE 1-5 GM/200ML-% IV SOLN
INTRAVENOUS | Status: AC
  Filled 2019-04-18: qty 200

## 2019-04-18 MED ORDER — FENTANYL CITRATE (PF) 100 MCG/2ML IJ SOLN
INTRAMUSCULAR | Status: DC | PRN
  Administered 2019-04-18: 50 ug via INTRAVENOUS
  Administered 2019-04-18 (×2): 25 ug via INTRAVENOUS

## 2019-04-18 MED ORDER — MIDAZOLAM HCL 2 MG/2ML IJ SOLN
1.0000 mg | Freq: Once | INTRAMUSCULAR | Status: AC
  Administered 2019-04-18: 07:00:00 1 mg via INTRAVENOUS

## 2019-04-18 MED ORDER — FERROUS SULFATE 325 (65 FE) MG PO TABS
325.0000 mg | ORAL_TABLET | Freq: Every day | ORAL | Status: DC
  Filled 2019-04-18: qty 1

## 2019-04-18 MED ORDER — FENTANYL CITRATE (PF) 100 MCG/2ML IJ SOLN
INTRAMUSCULAR | Status: AC
  Administered 2019-04-18: 10:00:00 25 ug via INTRAVENOUS
  Filled 2019-04-18: qty 2

## 2019-04-18 MED ORDER — GABAPENTIN 400 MG PO CAPS
400.0000 mg | ORAL_CAPSULE | Freq: Every day | ORAL | Status: DC
  Administered 2019-04-18: 21:00:00 400 mg via ORAL
  Filled 2019-04-18: qty 1

## 2019-04-18 MED ORDER — IRBESARTAN 150 MG PO TABS
75.0000 mg | ORAL_TABLET | Freq: Every day | ORAL | Status: DC
  Administered 2019-04-19: 75 mg via ORAL
  Filled 2019-04-18: qty 1

## 2019-04-18 MED ORDER — DEXAMETHASONE SODIUM PHOSPHATE 10 MG/ML IJ SOLN
INTRAMUSCULAR | Status: DC | PRN
  Administered 2019-04-18: 10 mg via INTRAVENOUS

## 2019-04-18 MED ORDER — DOCUSATE SODIUM 100 MG PO CAPS
100.0000 mg | ORAL_CAPSULE | Freq: Two times a day (BID) | ORAL | Status: DC
  Administered 2019-04-18 – 2019-04-19 (×2): 100 mg via ORAL
  Filled 2019-04-18 (×2): qty 1

## 2019-04-18 MED ORDER — METHIMAZOLE 5 MG PO TABS
2.5000 mg | ORAL_TABLET | ORAL | Status: DC
  Administered 2019-04-19: 10:00:00 2.5 mg via ORAL
  Filled 2019-04-18: qty 1

## 2019-04-18 MED ORDER — MIDAZOLAM HCL 2 MG/2ML IJ SOLN
INTRAMUSCULAR | Status: AC
  Administered 2019-04-18: 1 mg via INTRAVENOUS
  Filled 2019-04-18: qty 2

## 2019-04-18 MED ORDER — ROCURONIUM BROMIDE 100 MG/10ML IV SOLN
INTRAVENOUS | Status: DC | PRN
  Administered 2019-04-18: 20 mg via INTRAVENOUS
  Administered 2019-04-18: 40 mg via INTRAVENOUS
  Administered 2019-04-18: 10 mg via INTRAVENOUS

## 2019-04-18 MED ORDER — CYANOCOBALAMIN 1000 MCG/ML IJ SOLN: 1000.0000 ug | INTRAMUSCULAR | Status: DC

## 2019-04-18 MED ORDER — ACETAMINOPHEN 325 MG PO TABS: 325.0000 mg | ORAL_TABLET | Freq: Four times a day (QID) | ORAL | Status: DC | PRN

## 2019-04-18 MED ORDER — PROPOFOL 10 MG/ML IV BOLUS
INTRAVENOUS | Status: AC
  Filled 2019-04-18: qty 20

## 2019-04-18 MED ORDER — CEFAZOLIN SODIUM-DEXTROSE 2-4 GM/100ML-% IV SOLN
INTRAVENOUS | Status: AC
  Filled 2019-04-18: qty 100

## 2019-04-18 MED ORDER — VANCOMYCIN HCL IN DEXTROSE 1-5 GM/200ML-% IV SOLN
1000.0000 mg | Freq: Two times a day (BID) | INTRAVENOUS | Status: AC
  Administered 2019-04-18: 1000 mg via INTRAVENOUS
  Filled 2019-04-18: qty 200

## 2019-04-18 MED ORDER — ONDANSETRON HCL 4 MG/2ML IJ SOLN
INTRAMUSCULAR | Status: DC | PRN
  Administered 2019-04-18 (×2): 4 mg via INTRAVENOUS

## 2019-04-18 MED ORDER — DIAZEPAM 5 MG PO TABS: 10.0000 mg | ORAL_TABLET | Freq: Every day | ORAL | Status: DC | PRN

## 2019-04-18 MED ORDER — TRANEXAMIC ACID 1000 MG/10ML IV SOLN
INTRAVENOUS | Status: DC | PRN
  Administered 2019-04-18: 1000 mg via INTRAVENOUS

## 2019-04-18 MED ORDER — MIDAZOLAM HCL 2 MG/2ML IJ SOLN
INTRAMUSCULAR | Status: DC | PRN
  Administered 2019-04-18: 2 mg via INTRAVENOUS

## 2019-04-18 MED ORDER — BISACODYL 10 MG RE SUPP: 10.0000 mg | Freq: Every day | RECTAL | Status: DC | PRN

## 2019-04-18 MED ORDER — FENTANYL CITRATE (PF) 100 MCG/2ML IJ SOLN
50.0000 ug | Freq: Once | INTRAMUSCULAR | Status: AC
  Administered 2019-04-18: 07:00:00 50 ug via INTRAVENOUS

## 2019-04-18 MED ORDER — CLINDAMYCIN PHOSPHATE 900 MG/50ML IV SOLN
INTRAVENOUS | Status: AC
  Filled 2019-04-18: qty 50

## 2019-04-18 MED ORDER — KETOROLAC TROMETHAMINE 15 MG/ML IJ SOLN
15.0000 mg | Freq: Once | INTRAMUSCULAR | Status: AC
  Administered 2019-04-18: 15 mg via INTRAVENOUS

## 2019-04-18 MED ORDER — BUPIVACAINE HCL (PF) 0.5 % IJ SOLN
INTRAMUSCULAR | Status: DC | PRN
  Administered 2019-04-18: 3 mL via PERINEURAL
  Administered 2019-04-18: 7 mL via PERINEURAL

## 2019-04-18 MED ORDER — SUCCINYLCHOLINE CHLORIDE 20 MG/ML IJ SOLN
INTRAMUSCULAR | Status: DC | PRN
  Administered 2019-04-18: 100 mg via INTRAVENOUS

## 2019-04-18 MED ORDER — SUGAMMADEX SODIUM 200 MG/2ML IV SOLN
INTRAVENOUS | Status: DC | PRN
  Administered 2019-04-18: 169.6 mg via INTRAVENOUS

## 2019-04-18 MED ORDER — RISAQUAD PO CAPS
1.0000 | ORAL_CAPSULE | Freq: Every day | ORAL | Status: DC
  Administered 2019-04-18 – 2019-04-19 (×2): 1 via ORAL
  Filled 2019-04-18 (×2): qty 1

## 2019-04-18 MED ORDER — TOFACITINIB CITRATE 5 MG PO TABS: 5.0000 mg | ORAL_TABLET | Freq: Two times a day (BID) | ORAL | Status: DC

## 2019-04-18 MED ORDER — ONDANSETRON HCL 4 MG/2ML IJ SOLN: 4.0000 mg | Freq: Four times a day (QID) | INTRAMUSCULAR | Status: DC | PRN

## 2019-04-18 MED ORDER — LIDOCAINE HCL (CARDIAC) PF 100 MG/5ML IV SOSY
PREFILLED_SYRINGE | INTRAVENOUS | Status: DC | PRN
  Administered 2019-04-18: 100 mg via INTRAVENOUS

## 2019-04-18 MED ORDER — LIDOCAINE HCL (PF) 1 % IJ SOLN
INTRAMUSCULAR | Status: AC
  Filled 2019-04-18: qty 5

## 2019-04-18 MED ORDER — BUPIVACAINE LIPOSOME 1.3 % IJ SUSP
INTRAMUSCULAR | Status: AC
  Filled 2019-04-18: qty 20

## 2019-04-18 MED ORDER — FUROSEMIDE 20 MG PO TABS
20.0000 mg | ORAL_TABLET | Freq: Every day | ORAL | Status: DC
  Administered 2019-04-19: 20 mg via ORAL
  Filled 2019-04-18: qty 1

## 2019-04-18 MED ORDER — MAGNESIUM HYDROXIDE 400 MG/5ML PO SUSP: 30.0000 mL | Freq: Every day | ORAL | Status: DC | PRN

## 2019-04-18 MED ORDER — AMITRIPTYLINE HCL 10 MG PO TABS
10.0000 mg | ORAL_TABLET | Freq: Every day | ORAL | Status: DC
  Administered 2019-04-18: 10 mg via ORAL
  Filled 2019-04-18 (×2): qty 1

## 2019-04-18 MED ORDER — LACTATED RINGERS IV SOLN
INTRAVENOUS | Status: DC
  Administered 2019-04-18: 07:00:00 via INTRAVENOUS

## 2019-04-18 MED ORDER — ROPINIROLE HCL 1 MG PO TABS: 1.0000 mg | ORAL_TABLET | Freq: Every evening | ORAL | Status: DC | PRN

## 2019-04-18 MED ORDER — POTASSIUM CHLORIDE CRYS ER 20 MEQ PO TBCR
20.0000 meq | EXTENDED_RELEASE_TABLET | Freq: Every day | ORAL | Status: DC
  Administered 2019-04-18 – 2019-04-19 (×2): 20 meq via ORAL
  Filled 2019-04-18 (×2): qty 1

## 2019-04-18 MED ORDER — PREDNISONE 1 MG PO TABS
4.0000 mg | ORAL_TABLET | Freq: Every day | ORAL | Status: DC
  Administered 2019-04-19: 4 mg via ORAL
  Filled 2019-04-18: qty 4

## 2019-04-18 MED ORDER — VITAMIN D 25 MCG (1000 UNIT) PO TABS
1000.0000 [IU] | ORAL_TABLET | Freq: Every day | ORAL | Status: DC
  Administered 2019-04-19: 1000 [IU] via ORAL
  Filled 2019-04-18: qty 1

## 2019-04-18 MED ORDER — TRAMADOL HCL 50 MG PO TABS
50.0000 mg | ORAL_TABLET | Freq: Four times a day (QID) | ORAL | Status: DC | PRN
  Administered 2019-04-18: 50 mg via ORAL
  Filled 2019-04-18: qty 1

## 2019-04-18 MED ORDER — LORATADINE 10 MG PO TABS
10.0000 mg | ORAL_TABLET | Freq: Every day | ORAL | Status: DC
  Administered 2019-04-18 – 2019-04-19 (×2): 10 mg via ORAL
  Filled 2019-04-18 (×2): qty 1

## 2019-04-18 MED ORDER — ONDANSETRON HCL 4 MG PO TABS: 4.0000 mg | ORAL_TABLET | Freq: Four times a day (QID) | ORAL | Status: DC | PRN

## 2019-04-18 MED ORDER — VANCOMYCIN HCL 1000 MG IV SOLR
INTRAVENOUS | Status: DC | PRN
  Administered 2019-04-18: 1000 mg via INTRAVENOUS

## 2019-04-18 MED ORDER — BUPIVACAINE HCL (PF) 0.5 % IJ SOLN
INTRAMUSCULAR | Status: AC
  Filled 2019-04-18: qty 10

## 2019-04-18 MED ORDER — PHENYLEPHRINE HCL (PRESSORS) 10 MG/ML IV SOLN
INTRAVENOUS | Status: DC | PRN
  Administered 2019-04-18 (×3): 100 ug via INTRAVENOUS

## 2019-04-18 MED ORDER — SODIUM CHLORIDE 0.9 % IV SOLN
INTRAVENOUS | Status: DC
  Administered 2019-04-18: 11:00:00 via INTRAVENOUS

## 2019-04-18 MED ORDER — MAGNESIUM OXIDE 400 (241.3 MG) MG PO TABS
400.0000 mg | ORAL_TABLET | Freq: Every day | ORAL | Status: DC
  Administered 2019-04-19: 400 mg via ORAL
  Filled 2019-04-18: qty 1

## 2019-04-18 MED ORDER — FLEET ENEMA 7-19 GM/118ML RE ENEM: 1.0000 | ENEMA | Freq: Once | RECTAL | Status: DC | PRN

## 2019-04-18 MED ORDER — METOCLOPRAMIDE HCL 5 MG/ML IJ SOLN: 5.0000 mg | Freq: Three times a day (TID) | INTRAMUSCULAR | Status: DC | PRN

## 2019-04-18 MED ORDER — ENOXAPARIN SODIUM 40 MG/0.4ML ~~LOC~~ SOLN
40.0000 mg | SUBCUTANEOUS | Status: DC
  Administered 2019-04-19: 40 mg via SUBCUTANEOUS
  Filled 2019-04-18: qty 0.4

## 2019-04-18 MED ORDER — HYDROMORPHONE HCL 1 MG/ML IJ SOLN: 0.2500 mg | INTRAMUSCULAR | Status: DC | PRN

## 2019-04-18 MED ORDER — DIPHENOXYLATE-ATROPINE 2.5-0.025 MG PO TABS
2.0000 | ORAL_TABLET | Freq: Every day | ORAL | Status: DC
  Administered 2019-04-18 – 2019-04-19 (×2): 2 via ORAL
  Filled 2019-04-18 (×4): qty 2

## 2019-04-18 MED ORDER — KETOROLAC TROMETHAMINE 15 MG/ML IJ SOLN
INTRAMUSCULAR | Status: AC
  Filled 2019-04-18: qty 1

## 2019-04-18 MED ORDER — SODIUM CHLORIDE 0.9 % IV SOLN
INTRAVENOUS | Status: DC | PRN
  Administered 2019-04-18: 25 ug/min via INTRAVENOUS

## 2019-04-18 MED ORDER — BUPIVACAINE LIPOSOME 1.3 % IJ SUSP
INTRAMUSCULAR | Status: DC | PRN
  Administered 2019-04-18: 7 mL via PERINEURAL
  Administered 2019-04-18: 13 mL via PERINEURAL

## 2019-04-18 MED ORDER — BUPIVACAINE-EPINEPHRINE (PF) 0.5% -1:200000 IJ SOLN
INTRAMUSCULAR | Status: AC
  Filled 2019-04-18: qty 30

## 2019-04-18 MED ORDER — KETOROLAC TROMETHAMINE 15 MG/ML IJ SOLN
7.5000 mg | Freq: Four times a day (QID) | INTRAMUSCULAR | Status: AC
  Administered 2019-04-18 – 2019-04-19 (×4): 7.5 mg via INTRAVENOUS
  Filled 2019-04-18 (×4): qty 1

## 2019-04-18 MED ORDER — ASPIRIN EC 81 MG PO TBEC
81.0000 mg | DELAYED_RELEASE_TABLET | Freq: Every day | ORAL | Status: DC
  Administered 2019-04-19: 10:00:00 81 mg via ORAL
  Filled 2019-04-18: qty 1

## 2019-04-18 MED ORDER — TRANEXAMIC ACID 1000 MG/10ML IV SOLN
INTRAVENOUS | Status: AC
  Filled 2019-04-18: qty 10

## 2019-04-18 MED ORDER — SODIUM CHLORIDE FLUSH 0.9 % IV SOLN
INTRAVENOUS | Status: AC
  Filled 2019-04-18: qty 40

## 2019-04-18 MED ORDER — SUCRALFATE 1 G PO TABS: 0.5000 g | ORAL_TABLET | Freq: Three times a day (TID) | ORAL | Status: DC | PRN

## 2019-04-18 MED ORDER — ADULT MULTIVITAMIN W/MINERALS CH
1.0000 | ORAL_TABLET | Freq: Every day | ORAL | Status: DC
  Administered 2019-04-19: 1 via ORAL
  Filled 2019-04-18: qty 1

## 2019-04-18 MED ORDER — FENTANYL CITRATE (PF) 100 MCG/2ML IJ SOLN
25.0000 ug | INTRAMUSCULAR | Status: DC | PRN
  Administered 2019-04-18 (×2): 25 ug via INTRAVENOUS

## 2019-04-18 MED ORDER — DIPHENHYDRAMINE HCL 12.5 MG/5ML PO ELIX: 12.5000 mg | ORAL_SOLUTION | ORAL | Status: DC | PRN

## 2019-04-18 MED ORDER — METOCLOPRAMIDE HCL 10 MG PO TABS: 5.0000 mg | ORAL_TABLET | Freq: Three times a day (TID) | ORAL | Status: DC | PRN

## 2019-04-18 MED ORDER — B COMPLEX-C PO TABS
1.0000 | ORAL_TABLET | Freq: Every day | ORAL | Status: DC
  Administered 2019-04-19: 1 via ORAL
  Filled 2019-04-18 (×2): qty 1

## 2019-04-18 MED ORDER — VANCOMYCIN HCL IN DEXTROSE 1-5 GM/200ML-% IV SOLN: 1000.0000 mg | Freq: Once | INTRAVENOUS | Status: DC

## 2019-04-18 MED ORDER — HYDROMORPHONE HCL 2 MG PO TABS: 2.0000 mg | ORAL_TABLET | ORAL | Status: DC | PRN

## 2019-04-18 MED ORDER — MIDAZOLAM HCL 2 MG/2ML IJ SOLN
INTRAMUSCULAR | Status: AC
  Filled 2019-04-18: qty 2

## 2019-04-18 SURGICAL SUPPLY — 68 items
BASEPLATE GLENOSPHERE 25 (Plate) ×2 IMPLANT
BASEPLATE GLENOSPHERE 25MM (Plate) ×1 IMPLANT
BEARING HUMERAL SHLDER 36M STD (Shoulder) ×1 IMPLANT
BIT DRILL TWIST 2.7 (BIT) ×2 IMPLANT
BIT DRILL TWIST 2.7MM (BIT) ×1
BLADE SAGITTAL WIDE XTHICK NO (BLADE) ×3 IMPLANT
CANISTER SUCT 1200ML W/VALVE (MISCELLANEOUS) ×3 IMPLANT
CANISTER SUCT 3000ML PPV (MISCELLANEOUS) ×6 IMPLANT
CHLORAPREP W/TINT 26 (MISCELLANEOUS) ×3 IMPLANT
COOLER POLAR GLACIER W/PUMP (MISCELLANEOUS) ×3 IMPLANT
COVER BACK TABLE REUSABLE LG (DRAPES) ×3 IMPLANT
COVER WAND RF STERILE (DRAPES) ×3 IMPLANT
CRADLE LAMINECT ARM (MISCELLANEOUS) ×3 IMPLANT
DRAPE 3/4 80X56 (DRAPES) ×6 IMPLANT
DRAPE INCISE IOBAN 66X45 STRL (DRAPES) ×6 IMPLANT
DRAPE SPLIT 6X30 W/TAPE (DRAPES) ×6 IMPLANT
DRSG OPSITE POSTOP 4X8 (GAUZE/BANDAGES/DRESSINGS) ×3 IMPLANT
ELECT BLADE 6.5 EXT (BLADE) IMPLANT
ELECT CAUTERY BLADE 6.4 (BLADE) ×3 IMPLANT
GLENOID SPHERE STD STRL 36MM (Orthopedic Implant) ×3 IMPLANT
GLOVE BIO SURGEON STRL SZ7.5 (GLOVE) ×12 IMPLANT
GLOVE BIO SURGEON STRL SZ8 (GLOVE) ×12 IMPLANT
GLOVE BIOGEL PI IND STRL 8 (GLOVE) ×1 IMPLANT
GLOVE BIOGEL PI INDICATOR 8 (GLOVE) ×2
GLOVE INDICATOR 8.0 STRL GRN (GLOVE) ×3 IMPLANT
GOWN STRL REUS W/ TWL LRG LVL3 (GOWN DISPOSABLE) ×1 IMPLANT
GOWN STRL REUS W/ TWL XL LVL3 (GOWN DISPOSABLE) ×1 IMPLANT
GOWN STRL REUS W/TWL LRG LVL3 (GOWN DISPOSABLE) ×2
GOWN STRL REUS W/TWL XL LVL3 (GOWN DISPOSABLE) ×2
HOOD PEEL AWAY FLYTE STAYCOOL (MISCELLANEOUS) ×9 IMPLANT
KIT STABILIZATION SHOULDER (MISCELLANEOUS) ×3 IMPLANT
KIT TURNOVER KIT A (KITS) ×3 IMPLANT
MASK FACE SPIDER DISP (MASK) ×3 IMPLANT
MAT ABSORB  FLUID 56X50 GRAY (MISCELLANEOUS) ×2
MAT ABSORB FLUID 56X50 GRAY (MISCELLANEOUS) ×1 IMPLANT
NDL SAFETY ECLIPSE 18X1.5 (NEEDLE) ×1 IMPLANT
NEEDLE HYPO 18GX1.5 SHARP (NEEDLE) ×2
NEEDLE HYPO 22GX1.5 SAFETY (NEEDLE) ×3 IMPLANT
NEEDLE SPNL 20GX3.5 QUINCKE YW (NEEDLE) ×3 IMPLANT
NS IRRIG 500ML POUR BTL (IV SOLUTION) ×3 IMPLANT
PACK ARTHROSCOPY SHOULDER (MISCELLANEOUS) ×3 IMPLANT
PAD WRAPON POLAR SHDR UNIV (MISCELLANEOUS) ×1 IMPLANT
PIN THREADED REVERSE (PIN) ×3 IMPLANT
PULSAVAC PLUS IRRIG FAN TIP (DISPOSABLE) ×3
SCREW BONE STRL 6.5MMX30MM (Screw) ×3 IMPLANT
SCREW LOCKING STRL 4.75X25X3.5 (Screw) ×6 IMPLANT
SCREW NON-LOCK 4.75MMX15MM (Screw) ×6 IMPLANT
SHOULDER HUMERAL BEAR 36M STD (Shoulder) ×3 IMPLANT
SLING ULTRA II M (MISCELLANEOUS) ×6 IMPLANT
SOL .9 NS 3000ML IRR  AL (IV SOLUTION) ×2
SOL .9 NS 3000ML IRR UROMATIC (IV SOLUTION) ×1 IMPLANT
SPONGE LAP 18X18 RF (DISPOSABLE) ×3 IMPLANT
STAPLER SKIN PROX 35W (STAPLE) ×3 IMPLANT
STEM HUMERAL STRL 11MMX55MM (Stem) ×3 IMPLANT
SUT ETHIBOND 0 MO6 C/R (SUTURE) ×3 IMPLANT
SUT FIBERWIRE #2 38 BLUE 1/2 (SUTURE) ×12
SUT VIC AB 0 CT1 36 (SUTURE) ×6 IMPLANT
SUT VIC AB 2-0 CT1 (SUTURE) ×3 IMPLANT
SUT VIC AB 2-0 CT1 27 (SUTURE) ×4
SUT VIC AB 2-0 CT1 TAPERPNT 27 (SUTURE) ×2 IMPLANT
SUTURE FIBERWR #2 38 BLUE 1/2 (SUTURE) ×4 IMPLANT
SYR 10ML LL (SYRINGE) ×3 IMPLANT
SYR 30ML LL (SYRINGE) IMPLANT
TIP FAN IRRIG PULSAVAC PLUS (DISPOSABLE) ×1 IMPLANT
TRAY HUM MINI SHOULDER +0 40D (Shoulder) ×3 IMPLANT
TUBING CONNECTING 10 (TUBING) ×2 IMPLANT
TUBING CONNECTING 10' (TUBING) ×1
WRAPON POLAR PAD SHDR UNIV (MISCELLANEOUS) ×3

## 2019-04-18 NOTE — Anesthesia Procedure Notes (Signed)
Anesthesia Regional Block: Interscalene brachial plexus block   Pre-Anesthetic Checklist: ,, timeout performed, Correct Patient, Correct Site, Correct Laterality, Correct Procedure, Correct Position, site marked, Risks and benefits discussed,  Surgical consent,  Pre-op evaluation,  At surgeon's request and post-op pain management  Laterality: Upper and Right  Prep: chloraprep       Needles:  Injection technique: Single-shot  Needle Type: Stimiplex     Needle Length: 5cm  Needle Gauge: 22     Additional Needles:   Procedures:,,,, ultrasound used (permanent image in chart),,,,  Narrative:  Start time: 04/18/2019 7:05 AM End time: 04/18/2019 7:10 AM Injection made incrementally with aspirations every 5 mL.  Performed by: Personally  Anesthesiologist: Terril Chestnut, Precious Haws, MD  Additional Notes: Patient consented for risk and benefits of nerve block including but not limited to nerve damage, failed block, bleeding and infection.  Patient voiced understanding.  Functioning IV was confirmed and monitors were applied.  A 11m 22ga Stimuplex needle was used. Sterile prep,hand hygiene and sterile gloves were used.  Minimal sedation used for procedure.  No paresthesia endorsed by patient during the procedure.  Negative aspiration and negative test dose prior to incremental administration of local anesthetic. The patient tolerated the procedure well with no immediate complications.

## 2019-04-18 NOTE — Anesthesia Postprocedure Evaluation (Signed)
Anesthesia Post Note  Patient: Nicole Bass  Procedure(s) Performed: REVERSE SHOULDER ARTHROPLASTY (Right Shoulder)  Patient location during evaluation: PACU Anesthesia Type: General Level of consciousness: awake and alert Pain management: pain level controlled Vital Signs Assessment: post-procedure vital signs reviewed and stable Respiratory status: spontaneous breathing, nonlabored ventilation, respiratory function stable and patient connected to nasal cannula oxygen Cardiovascular status: blood pressure returned to baseline and stable Postop Assessment: no apparent nausea or vomiting Anesthetic complications: no     Last Vitals:  Vitals:   04/18/19 1049 04/18/19 1117  BP: (!) 119/58 (!) 113/59  Pulse: 86 83  Resp: 18 18  Temp: (!) 36.4 C 36.8 C  SpO2: 94% 93%    Last Pain:  Vitals:   04/18/19 1208  TempSrc:   PainSc: 6                  Precious Haws Piscit[place]

## 2019-04-18 NOTE — Anesthesia Post-op Follow-up Note (Signed)
Anesthesia QCDR form completed.        

## 2019-04-18 NOTE — Transfer of Care (Signed)
Immediate Anesthesia Transfer of Care Note  Patient: YER CASTELLO  Procedure(s) Performed: REVERSE SHOULDER ARTHROPLASTY (Right Shoulder)  Patient Location: PACU  Anesthesia Type:General  Level of Consciousness: awake, alert  and oriented  Airway & Oxygen Therapy: Patient Spontanous Breathing and Patient connected to nasal cannula oxygen  Post-op Assessment: Report given to RN and Post -op Vital signs reviewed and stable  Post vital signs: Reviewed and stable  Last Vitals:  Vitals Value Taken Time  BP 121/53 04/18/19 1004  Temp    Pulse 54 04/18/19 1010  Resp 18 04/18/19 1011  SpO2 90 % 04/18/19 1010  Vitals shown include unvalidated device data.  Last Pain:  Vitals:   04/18/19 0630  TempSrc: Temporal  PainSc: 0-No pain         Complications: No apparent anesthesia complications

## 2019-04-18 NOTE — Progress Notes (Signed)
Pt reported "pinching" sensation on back of operative arm (right). Nurse noted slight bruise to the area. Ice pack applied. Patient reports symptoms improved.

## 2019-04-18 NOTE — H&P (Signed)
Paper H&P to be scanned into permanent record. H&P reviewed and patient re-examined. No changes. 

## 2019-04-18 NOTE — Anesthesia Procedure Notes (Signed)
Procedure Name: Intubation Date/Time: 04/18/2019 7:35 AM Performed by: Nelda Marseille, CRNA Pre-anesthesia Checklist: Patient identified, Patient being monitored, Timeout performed, Emergency Drugs available and Suction available Patient Re-evaluated:Patient Re-evaluated prior to induction Oxygen Delivery Method: Circle system utilized Preoxygenation: Pre-oxygenation with 100% oxygen Induction Type: IV induction Ventilation: Mask ventilation without difficulty Laryngoscope Size: Mac, 3 and McGraph Grade View: Grade I Tube type: Oral Tube size: 7.0 mm Number of attempts: 1 Airway Equipment and Method: Stylet and Video-laryngoscopy Placement Confirmation: ETT inserted through vocal cords under direct vision,  positive ETCO2 and breath sounds checked- equal and bilateral Secured at: 22 cm Tube secured with: Tape Dental Injury: Teeth and Oropharynx as per pre-operative assessment

## 2019-04-18 NOTE — Evaluation (Signed)
Physical Therapy Evaluation Patient Details Name: Nicole Bass MRN: [id number] DOB: 1950/09/05 Today's Date: 04/18/2019   History of Present Illness  Patient is 68 yo female s/p right  reverse total shoulder arthroplasty. PMH HTN, GERD, RA, neuropathy, previous back surgeries    Clinical Impression  The patient was up in bed, reported being very uncomfortable and requesting to use the bathroom. Pt stated that she lives at home with her husband, previously independent. PT and pt spent extensive time adjusting shoulder sling and abduction pillow, polar care, and donning clothing properly for decency but would benefit from further instruction.   The patient demonstrated bed mobility with handheld assist, and sit <> stand transfers from bed and commode, supervision. The patient ambulated ~143f with IV pole and CGA, decreased gait velocity noted but overall steady/safe. PT and pt also discussed PT role/POC as well as weight bearing precautions of RUE.  Overall the patient demonstrated deficits (see "PT Problem List") that impede the patient's functional abilities, safety, and mobility and would benefit from skilled PT intervention. Recommendation is HHPT.      Follow Up Recommendations Home health PT    Equipment Recommendations  None recommended by PT;Other (comment)(Patient has a SPC at home)    Recommendations for Other Services OT consult     Precautions / Restrictions Precautions Precautions: Fall;Shoulder Shoulder Interventions: Shoulder sling/immobilizer;Shoulder abduction pillow;At all times;Off for dressing/bathing/exercises Precaution Booklet Issued: No Restrictions Weight Bearing Restrictions: Yes RUE Weight Bearing: Non weight bearing      Mobility  Bed Mobility Overal bed mobility: Modified Independent             General bed mobility comments: use of bed rails, handheld assist provided but not necessarily required  Transfers Overall transfer level: Needs  assistance   Transfers: Sit to/from Stand Sit to Stand: Supervision         General transfer comment: handheld assist provided, thought not necessarily required  Ambulation/Gait   Gait Distance (Feet): 190 Feet Assistive device: IV Pole       General Gait Details: PT preferred UE support with ambulation, utilized IV pole well. no LOB noted. decreased gait velocity  Stairs            Wheelchair Mobility    Modified Rankin (Stroke Patients Only)       Balance Overall balance assessment: Needs assistance Sitting-balance support: Feet supported Sitting balance-Leahy Scale: Good       Standing balance-Leahy Scale: Good                               Pertinent Vitals/Pain Pain Assessment: Faces Faces Pain Scale: Hurts a little bit Pain Location: up in recliner pt stated she had some pain Pain Descriptors / Indicators: Grimacing Pain Intervention(s): Limited activity within patient's tolerance;Monitored during session;Repositioned;Ice applied    Home Living Family/patient expects to be discharged to:: Private residence Living Arrangements: Spouse/significant other Available Help at Discharge: Family Type of Home: House Home Access: Stairs to enter Entrance Stairs-Rails: Can reach both Entrance Stairs-Number of Steps: 3 Home Layout: Two level;Able to live on main level with bedroom/bathroom Home Equipment: W[place] Rile- 2 wheels;Cane - single point      Prior Function           Comments: Pt only ever uses AD when she has rheumatoid flare up (R LE), otherwise out of the home daily and able to be very independent/active     Hand Dominance  Dominant Hand: Right    Extremity/Trunk Assessment   Upper Extremity Assessment Upper Extremity Assessment: Defer to OT evaluation;RUE deficits/detail;LUE deficits/detail RUE Deficits / Details: s/p  reverse TSA RUE: Unable to fully assess due to immobilization LUE Deficits / Details: WFLs             Communication   Communication: No difficulties  Cognition Arousal/Alertness: Awake/alert Behavior During Therapy: WFL for tasks assessed/performed Overall Cognitive Status: Within Functional Limits for tasks assessed                                        General Comments      Exercises Other Exercises Other Exercises: Patient able to transfer to normal commode with supervision, perform self care with supervision, and utilize grab bar for transfer Other Exercises: PT and pt spent extensive time repositioning abduction sling for comfort and for proper support, as well as time spent to adjust polar care as well.   Assessment/Plan    PT Assessment Patient needs continued PT services  PT Problem List Decreased strength;Decreased mobility;Decreased range of motion;Decreased coordination;Decreased activity tolerance;Pain;Decreased balance;Decreased knowledge of use of DME;Decreased knowledge of precautions       PT Treatment Interventions DME instruction;Therapeutic exercise;Gait training;Balance training;Stair training;Neuromuscular re-education;Functional mobility training;Therapeutic activities;Patient/family education    PT Goals (Current goals can be found in the Care Plan section)  Acute Rehab PT Goals Patient Stated Goal: to go home PT Goal Formulation: With patient Time For Goal Achievement: 05/02/19 Potential to Achieve Goals: Good    Frequency BID   Barriers to discharge        Co-evaluation               AM-PAC PT "6 Clicks" Mobility  Outcome Measure Help needed turning from your back to your side while in a flat bed without using bedrails?: A Little Help needed moving from lying on your back to sitting on the side of a flat bed without using bedrails?: A Little Help needed moving to and from a bed to a chair (including a wheelchair)?: A Little Help needed standing up from a chair using your arms (e.g., wheelchair or bedside chair)?: A  Little Help needed to walk in hospital room?: A Little Help needed climbing 3-5 steps with a railing? : A Little 6 Click Score: 18    End of Session Equipment Utilized During Treatment: Gait belt Activity Tolerance: Patient tolerated treatment well Patient left: in chair;with chair alarm set;with family/visitor present;with call bell/phone within reach;with SCD's reapplied Nurse Communication: Mobility status PT Visit Diagnosis: Unsteadiness on feet (R26.81);Other abnormalities of gait and mobility (R26.89);Muscle weakness (generalized) (M62.81);Difficulty in walking, not elsewhere classified (R26.2);Other symptoms and signs involving the nervous system (R29.898);Pain Pain - Right/Left: Right Pain - part of body: Shoulder    Time: 9024-0973 PT Time Calculation (min) (ACUTE ONLY): 46 min   Charges:   PT Evaluation $PT Eval Low Complexity: 1 Low PT Treatments $Therapeutic Exercise: 23-37 mins $Therapeutic Activity: 8-22 mins      Lieutenant Diego PT, DPT 4:01 PM,04/18/19 [phone]
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
## 2019-04-18 NOTE — Anesthesia Preprocedure Evaluation (Signed)
Anesthesia Evaluation  Patient identified by MRN, date of birth, ID band Patient awake    Reviewed: Allergy & Precautions, H&P , NPO status , Patient's Chart, lab work & pertinent test results  History of Anesthesia Complications Negative for: history of anesthetic complications  Airway Mallampati: III  TM Distance: <3 FB Neck ROM: full    Dental  (+) Chipped, Poor Dentition, Missing, Implants, Upper Dentures   Pulmonary neg pulmonary ROS, neg shortness of breath,           Cardiovascular Exercise Tolerance: Good hypertension, (-) angina(-) Past MI and (-) DOE      Neuro/Psych PSYCHIATRIC DISORDERS  Neuromuscular disease    GI/Hepatic Neg liver ROS, GERD  ,  Endo/Other  Hyperthyroidism   Renal/GU CRFRenal disease     Musculoskeletal   Abdominal   Peds  Hematology negative hematology ROS (+)   Anesthesia Other Findings Past Medical History: No date: Anemia No date: Arthritis     Comment:  rheumatoid No date: Collagen vascular disease (HCC) No date: Crohn's disease (HCC) No date: DDD (degenerative disc disease), cervical No date: DDD (degenerative disc disease), cervical 2003: DDD (degenerative disc disease), cervical No date: DDD (degenerative disc disease), lumbar No date: Degenerative disc disease, cervical No date: GERD (gastroesophageal reflux disease) No date: Hypertension No date: Neuromuscular disorder ([place]) No date: Neuropathy  Past Surgical History: No date: ABDOMINAL HYSTERECTOMY No date: APPENDECTOMY No date: BACK SURGERY     Comment:  x 3 No date: CERVICAL SPINE SURGERY 1976 & 1989: CESAREAN SECTION No date: CHOLECYSTECTOMY 1999: COLON SURGERY     Comment:  removed 18 inches of colon 2004: [place] OF UTERUS 2008: ELBOW FRACTURE SURGERY 2015: JOINT REPLACEMENT; Right     Comment:  partial knee replacement No date: KYPHOPLASTY 04/14/2015: KYPHOPLASTY; N/A     Comment:   Procedure: KYPHOPLASTY;  Surgeon: Hessie Knows, MD;                Location: [facility] ORS;  Service: Orthopedics;  Laterality:               N/A; 06/04/2015: KYPHOPLASTY; N/A     Comment:  Procedure: KYPHOPLASTY L-5;  Surgeon: Hessie Knows, MD;               Location: [facility] OR[facility];  Service: Orthopedics;  Laterality:               N/A; No date: MEDIAL PARTIAL KNEE REPLACEMENT No date: OOPHORECTOMY; Bilateral 01/08/2019: REPAIR EXTENSOR TENDON; Left     Comment:  Procedure: Realignment  EXTENSOR TENDON;  Surgeon: Hessie Knows, MD;  Location: [facility] OR[facility];  Service: Orthopedics;               Laterality: Left; 04/13/2016: SHOULDER ARTHROSCOPY WITH ROTATOR CUFF REPAIR AND  SUBACROMIAL DECOMPRESSION; Right     Comment:  Procedure: SHOULDER ARTHROSCOPY WITH ROTATOR CUFF REPAIR              AND SUBACROMIAL DECOMPRESSION, release of long head               biceps tendon;  Surgeon: Leanor Kail, MD;  Location:               [facility] OR[facility];  Service: Orthopedics;  Laterality: Right; 2013: TOE SURGERY No date: TONSILLECTOMY No date: WRIST FRACTURE SURGERY; Bilateral  BMI    Body Mass Index: 35.33 kg/m  Reproductive/Obstetrics negative OB ROS                             Anesthesia Physical Anesthesia Plan  ASA: III  Anesthesia Plan: General ETT   Post-op Pain Management: GA combined w/ Regional for post-op pain   Induction: Intravenous  PONV Risk Score and Plan: Ondansetron, Dexamethasone, Midazolam and Treatment may vary due to age or medical condition  Airway Management Planned: Oral ETT  Additional Equipment:   Intra-op Plan:   Post-operative Plan: Extubation in OR  Informed Consent: I have reviewed the patients History and Physical, chart, labs and discussed the procedure including the risks, benefits and alternatives for the proposed anesthesia with the patient or authorized representative who has indicated his/her understanding and acceptance.      Dental Advisory Given  Plan Discussed with: Anesthesiologist, CRNA and Surgeon  Anesthesia Plan Comments: (Patient consented for risks of anesthesia including but not limited to:  - adverse reactions to medications - damage to teeth, lips or other oral mucosa - sore throat or hoarseness - Damage to heart, brain, lungs or loss of life  Patient voiced understanding.)        Anesthesia Quick Evaluation

## 2019-04-19 LAB — CBC WITH DIFFERENTIAL/PLATELET
Abs Immature Granulocytes: 0.05 10*3/uL (ref 0.00–0.07)
Basophils Absolute: 0 10*3/uL (ref 0.0–0.1)
Basophils Relative: 0 %
Eosinophils Absolute: 0 10*3/uL (ref 0.0–0.5)
Eosinophils Relative: 0 %
HCT: 28.4 % — ABNORMAL LOW (ref 36.0–46.0)
Hemoglobin: 9.2 g/dL — ABNORMAL LOW (ref 12.0–15.0)
Immature Granulocytes: 0 %
Lymphocytes Relative: 3 %
Lymphs Abs: 0.4 10*3/uL — ABNORMAL LOW (ref 0.7–4.0)
MCH: 32.2 pg (ref 26.0–34.0)
MCHC: 32.4 g/dL (ref 30.0–36.0)
MCV: 99.3 fL (ref 80.0–100.0)
Monocytes Absolute: 1 10*3/uL (ref 0.1–1.0)
Monocytes Relative: 8 %
Neutro Abs: 10.9 10*3/uL — ABNORMAL HIGH (ref 1.7–7.7)
Neutrophils Relative %: 89 %
Platelets: 191 10*3/uL (ref 150–400)
RBC: 2.86 MIL/uL — ABNORMAL LOW (ref 3.87–5.11)
RDW: 12.3 % (ref 11.5–15.5)
WBC: 12.4 10*3/uL — ABNORMAL HIGH (ref 4.0–10.5)
nRBC: 0 % (ref 0.0–0.2)

## 2019-04-19 LAB — BASIC METABOLIC PANEL
Anion gap: 7 (ref 5–15)
BUN: 23 mg/dL (ref 8–23)
CO2: 22 mmol/L (ref 22–32)
Calcium: 8.1 mg/dL — ABNORMAL LOW (ref 8.9–10.3)
Chloride: 112 mmol/L — ABNORMAL HIGH (ref 98–111)
Creatinine, Ser: 1.06 mg/dL — ABNORMAL HIGH (ref 0.44–1.00)
GFR calc Af Amer: >60 mL/min (ref >60)
GFR calc non Af Amer: 55 mL/min — ABNORMAL LOW (ref >60)
Glucose, Bld: 194 mg/dL — ABNORMAL HIGH (ref 70–99)
Potassium: 3.9 mmol/L (ref 3.5–5.1)
Sodium: 141 mmol/L (ref 135–145)

## 2019-04-19 LAB — SURGICAL PATHOLOGY

## 2019-04-19 MED ORDER — ASPIRIN EC 325 MG PO TBEC: 325.0000 mg | DELAYED_RELEASE_TABLET | Freq: Every day | ORAL | 0 refills | Status: DC

## 2019-04-19 MED ORDER — KETOROLAC TROMETHAMINE 10 MG PO TABS: 10.0000 mg | ORAL_TABLET | Freq: Four times a day (QID) | ORAL | 0 refills | Status: DC | PRN

## 2019-04-19 MED ORDER — KETOROLAC TROMETHAMINE 15 MG/ML IJ SOLN
7.5000 mg | Freq: Once | INTRAMUSCULAR | Status: AC
  Administered 2019-04-19: 14:00:00 7.5 mg via INTRAVENOUS
  Filled 2019-04-19: qty 1

## 2019-04-19 NOTE — Discharge Summary (Signed)
Physician Discharge Summary  Patient ID: Nicole Bass MRN: [id number] DOB/AGE: 1950-12-31 68 y.o.  Admit date: 04/18/2019 Discharge date: 04/19/2019  Admission Diagnoses:  [place] CUFF  Discharge Diagnoses: Patient Active Problem List   Diagnosis Date Noted  . 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 Rotator cuff arthropathy, right 01/25/2019  . Iron deficiency anemia 09/13/2018  . Anemia, unspecified 09/09/2018  . Acute kidney injury ([place]) 08/23/2018  . HTN, goal below 140/80 06/04/2018  . Hyperthyroidism 02/08/2018  . Stomatitis, ulcerative 09/06/2017  . Dyspareunia in female 08/22/2017  . Vaginal atrophy 08/22/2017  . Obesity (BMI 30.0-34.9) 08/22/2017  . Leg pain, left 04/05/2017  . Peripheral polyneuropathy 04/05/2017  . Lumbar radiculopathy 04/05/2017  . Tremor 02/13/2017  . Sensory neuropathy 02/13/2017  . Restless leg syndrome 02/13/2017  . Acute diarrhea 09/06/2016  . History of pyelonephritis 09/06/2016  . Hypokalemia 09/06/2016  . 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 Pyelonephritis 08/29/2016  . Rheumatoid arthritis, unspecified ([place]) 08/29/2016  . Nontraumatic complete tear of right rotator cuff 03/08/2016  . Acute pain of right shoulder 01/27/2016  . Type 2 diabetes mellitus with diabetic neuropathy ([place]) 10/27/2015  . Crohn's colitis ([place]) 09/04/2015  . Acute bilateral low back pain without sciatica 03/23/2015  . SOB (shortness of breath) on exertion 01/15/2015  . Pedal edema 01/15/2015  . Status post open reduction with internal fixation of fracture 11/27/2014  . Painful rib 09/11/2014  . Osteoporosis 03/17/2014  . Depression 03/17/2014  . B12 deficiency 03/17/2014  . Crohn's disease, unspecified, without complications ([place]) 82/99/3716  . Urolith 07/16/2013  . Right flank pain 07/16/2013  . Abdominal pain 07/16/2013    Past Medical History:  Diagnosis Date  . Anemia   .  Arthritis    rheumatoid  . Collagen vascular disease ([place])   . Crohn's disease ([place])   . DDD (degenerative disc disease), cervical   . DDD (degenerative disc disease), cervical   . DDD (degenerative disc disease), cervical 2003  . DDD (degenerative disc disease), lumbar   . Degenerative disc disease, cervical   . GERD (gastroesophageal reflux disease)   . Hypertension   . Neuromuscular disorder ([place])   . Neuropathy      Transfusion: None.   Consultants (if any):   Discharged Condition: Improved  Hospital Course: Nicole Bass is an 68 y.o. female who was admitted 04/18/2019 with a diagnosis of a nontraumatic recurrent rotator cuff tear with cuff arthropathy of the right shoulder and went to the operating room on 04/18/2019 and underwent the above named procedures.    Surgeries: Procedure(s): REVERSE SHOULDER ARTHROPLASTY on 04/18/2019 Patient tolerated the surgery well. Taken to PACU where she was stabilized and then transferred to the orthopedic floor.  Started on Lovenox 91m q 24 hrs. Foot pumps applied bilaterally at 80 mm. Heels elevated on bed with rolled towels. No evidence of DVT. Negative [place]. Physical therapy started on day #1 for gait training and transfer. OT started day #1 for ADL and assisted devices.  Patient's IV was d/c on POD1.  Implants: All press-fit [employer] Comprehensive system with a #11 micro-humeral stem, a 40 mm humeral tray with a standard insert, and a mini-base plate with a 36 mm glenosphere.  She was given perioperative antibiotics:  Anti-infectives (From admission, onward)   Start     Dose/Rate Route Frequency Ordered Stop   04/18/19 1200  vancomycin (VANCOCIN) IVPB 1000 mg/200 mL premix     1,000 mg  200 mL/hr over 60 Minutes Intravenous Every 12 hours 04/18/19 1119 04/18/19 1326   04/18/19 0730  vancomycin (VANCOCIN) IVPB 1000 mg/200 mL premix  Status:  Discontinued     1,000 mg 200 mL/hr over 60 Minutes Intravenous  Once 04/18/19 0722 04/18/19  1116   04/18/19 0718  vancomycin (VANCOCIN) 1-5 GM/200ML-% IVPB    Note to Pharmacy: Lyman Bishop   : cabinet override      04/18/19 0718 04/18/19 1929   04/18/19 0716  clindamycin (CLEOCIN) 900 MG/50ML IVPB  Status:  Discontinued    Note to Pharmacy: Lyman Bishop   : cabinet override      04/18/19 0716 04/18/19 0728   04/18/19 0716  ceFAZolin (ANCEF) 2-4 GM/100ML-% IVPB  Status:  Discontinued    Note to Pharmacy: Lyman Bishop   : cabinet override      04/18/19 0716 04/18/19 0728   04/18/19 0700  ceFAZolin (ANCEF) IVPB 2g/100 mL premix  Status:  Discontinued     2 g 200 mL/hr over 30 Minutes Intravenous  Once 04/18/19 0648 04/18/19 0722    .  She was given sequential compression devices, early ambulation, and Lovenox for DVT prophylaxis.  She benefited maximally from the hospital stay and there were no complications.    Recent vital signs:  Vitals:   04/18/19 2338 04/19/19 0339  BP: 128/65 132/76  Pulse: 81 88  Resp: 19 19  Temp: 98 F (36.7 C) 97.8 F (36.6 C)  SpO2: 98% 98%    Recent laboratory studies:  Lab Results  Component Value Date   HGB 9.2 (L) 04/19/2019   HGB 11.5 (L) 04/12/2019   HGB 11.2 (L) 02/12/2019   Lab Results  Component Value Date   WBC 12.4 (H) 04/19/2019   PLT 191 04/19/2019   Lab Results  Component Value Date   INR 0.9 04/12/2019   Lab Results  Component Value Date   NA 141 04/19/2019   K 3.9 04/19/2019   CL 112 (H) 04/19/2019   CO2 22 04/19/2019   BUN 23 04/19/2019   CREATININE 1.06 (H) 04/19/2019   GLUCOSE 194 (H) 04/19/2019    Discharge Medications:   Allergies as of 04/19/2019      Reactions   Ciprofloxacin Itching, Rash   Levofloxacin Hives, Rash   Methotrexate Derivatives    Mouth and throat ulcers   Sulfa Antibiotics Other (See Comments)   Per pts MD she is unable to take this medication because it is contraindicated with her other medications.   Cefazolin Itching, Rash   Cefprozil Rash   Cephalosporins  Itching, Rash   Enbrel [etanercept] Itching, Rash   Humira [adalimumab] Itching, Rash   Hydrocodone-acetaminophen Itching, Other (See Comments)   Itching only   Lorabid [loracarbef] Itching, Rash   Meropenem Itching, Rash   Oxycodone Itching, Other (See Comments)   Oxycodone-acetaminophen Itching   Primidone Itching, Rash      Medication List    TAKE these medications   alendronate 70 MG tablet Commonly known as: FOSAMAX Take 70 mg by mouth every Monday. Take with a full glass of water on an empty stomach.   amitriptyline 10 MG tablet Commonly known as: ELAVIL Take 10 mg by mouth at bedtime.   aspirin EC 325 MG tablet Take 1 tablet (325 mg total) by mouth daily. What changed:   medication strength  how much to take   b complex vitamins tablet Take 1 tablet by mouth daily.   cetirizine 10 MG tablet Commonly known  as: ZYRTEC Take 10 mg by mouth daily.   cholecalciferol 25 MCG (1000 UT) tablet Commonly known as: VITAMIN D3 Take 1,000 Units by mouth daily.   cyanocobalamin 1000 MCG/ML injection Commonly known as: (VITAMIN B-12) Inject 1,000 mcg into the muscle every 30 (thirty) days.   diazepam 10 MG tablet Commonly known as: Valium Take 1 tablet (10 mg total) by mouth daily as needed (vaginal pain with intercourse, use before sexual activity per vagina).   diphenoxylate-atropine 2.5-0.025 MG tablet Commonly known as: LOMOTIL Take 2 tablets by mouth daily.   ferrous sulfate 325 (65 FE) MG tablet Take 1 tablet (325 mg total) by mouth 2 (two) times daily with a meal. What changed: when to take this   furosemide 20 MG tablet Commonly known as: LASIX Take 20 mg by mouth daily.   gabapentin 400 MG capsule Commonly known as: NEURONTIN Take 400 mg by mouth at bedtime.   ketorolac 10 MG tablet Commonly known as: TORADOL Take 1 tablet (10 mg total) by mouth every 6 (six) hours as needed.   magnesium oxide 400 MG tablet Commonly known as: MAG-OX Take 400 mg  by mouth daily.   methimazole 5 MG tablet Commonly known as: TAPAZOLE Take 2.5 mg by mouth every Monday, Wednesday, and Friday.   multivitamin with minerals Tabs tablet Take 1 tablet by mouth daily.   naproxen 500 MG tablet Commonly known as: NAPROSYN Take 500 mg by mouth 2 (two) times daily as needed for moderate pain.   omeprazole 40 MG capsule Commonly known as: PRILOSEC Take 40 mg by mouth daily before breakfast.   potassium chloride SA 20 MEQ tablet Commonly known as: K-DUR Take 20 mEq by mouth daily.   predniSONE 1 MG tablet Commonly known as: DELTASONE Take 4 mg by mouth daily with breakfast.   PROBIOTIC PO Take 1 capsule by mouth daily.   rOPINIRole 1 MG tablet Commonly known as: REQUIP Take 1 mg by mouth at bedtime as needed (for restless legs).   sucralfate 1 g tablet Commonly known as: CARAFATE Take 0.5 g by mouth 3 (three) times daily with meals as needed (for stomach cramping (crohn's disease)).   telmisartan 40 MG tablet Commonly known as: MICARDIS Take 20 mg by mouth every Monday, Wednesday, and Friday.   traMADol 50 MG tablet Commonly known as: ULTRAM Take 100 mg by mouth 3 (three) times daily as needed for moderate pain.   Tylenol 8 Hour Arthritis Pain 650 MG CR tablet Generic drug: acetaminophen Take 1,300 mg by mouth every 8 (eight) hours as needed for pain.   Xeljanz 5 MG Tabs Generic drug: Tofacitinib Citrate Take 5 mg by mouth 2 (two) times daily.      Diagnostic Studies: Dg Shoulder Right Port  Result Date: 04/18/2019 CLINICAL DATA:  Status post reversed total right shoulder replacement. EXAM: PORTABLE RIGHT SHOULDER COMPARISON:  None. FINDINGS: The right glenoid and humeral components appear to be well situated. Expected postoperative changes are seen in the surrounding soft tissues. No fracture or dislocation is noted. Severe degenerative change of right acromioclavicular joint is noted. IMPRESSION: Status post right total shoulder  arthroplasty. Electronically Signed   By: Marijo Conception M.D.   On: 04/18/2019 10:37   [place] Or Nerve Block-image Only ([facility])  Result Date: 04/18/2019 There is no interpretation for this exam.  This order is for images obtained during a surgical procedure.  Please See "Surgeries" Tab for more information regarding the procedure.   Mm 3d Screen Breast  Bilateral  Result Date: 04/09/2019 CLINICAL DATA:  Screening. EXAM: DIGITAL SCREENING BILATERAL MAMMOGRAM WITH TOMO AND CAD COMPARISON:  Previous exam(s). ACR Breast Density Category c: The breast tissue is heterogeneously dense, which may obscure small masses. FINDINGS: There are no findings suspicious for malignancy. Images were processed with CAD. IMPRESSION: No mammographic evidence of malignancy. A result letter of this screening mammogram will be mailed directly to the patient. RECOMMENDATION: Screening mammogram in one year. (Code:SM-B-01Y) BI-RADS CATEGORY  1: Negative. Electronically Signed   By: Curlene Dolphin M.D.   On: 04/09/2019 10:13   Disposition: Plan for discharge home this afternoon pending progress with PT.  Follow-up Information    Lattie Corns, PA-C Follow up in 14 day(s).   Specialty: Physician Assistant Why: Electa Sniff information: [place] [address] [phone]          Signed: Judson Roch PA-C 04/19/2019, 7:58 AM

## 2019-04-19 NOTE — Progress Notes (Signed)
  Subjective: 1 Day Post-Op Procedure(s) (LRB): REVERSE SHOULDER ARTHROPLASTY (Right) Patient reports pain as mild.   Patient is well, and has had no acute complaints or problems Plan is to go Home after hospital stay. Negative for chest pain and shortness of breath Fever: no Gastrointestinal:Negative for nausea and vomiting  Objective: Vital signs in last 24 hours: Temp:  [97.2 F (36.2 C)-98.2 F (36.8 C)] 97.8 F (36.6 C) (09/04 0339) Pulse Rate:  [74-112] 88 (09/04 0339) Resp:  [18-24] 19 (09/04 0339) BP: (113-132)/(53-76) 132/76 (09/04 0339) SpO2:  [93 %-100 %] 98 % (09/04 0339)  Intake/Output from previous day:  Intake/Output Summary (Last 24 hours) at 04/19/2019 0752 Last data filed at 04/19/2019 0300 Gross per 24 hour  Intake 2450 ml  Output 200 ml  Net 2250 ml    Intake/Output this shift: No intake/output data recorded.  Labs: Recent Labs    04/19/19 0303  HGB 9.2*   Recent Labs    04/19/19 0303  WBC 12.4*  RBC 2.86*  HCT 28.4*  PLT 191   Recent Labs    04/19/19 0303  NA 141  K 3.9  CL 112*  CO2 22  BUN 23  CREATININE 1.06*  GLUCOSE 194*  CALCIUM 8.1*   No results for input(s): LABPT, INR in the last 72 hours.   EXAM General - Patient is Alert, Appropriate and Oriented Extremity - ABD soft Incision: dressing C/D/I No cellulitis present  Decreased sensation to light touch to the right arm this AM, nerve block effects have not worn off. Dressing/Incision - clean, dry, no drainage Motor Function - intact, moving foot and toes well on exam. Able to flex and extend fingers to the right hand.  Past Medical History:  Diagnosis Date  . Anemia   . Arthritis    rheumatoid  . Collagen vascular disease ([place])   . Crohn's disease ([place])   . DDD (degenerative disc disease), cervical   . DDD (degenerative disc disease), cervical   . DDD (degenerative disc disease), cervical 2003  . DDD (degenerative disc disease), lumbar   . Degenerative disc  disease, cervical   . GERD (gastroesophageal reflux disease)   . Hypertension   . Neuromuscular disorder ([place])   . Neuropathy     Assessment/Plan: 1 Day Post-Op Procedure(s) (LRB): REVERSE SHOULDER ARTHROPLASTY (Right) Active Problems:   Status post reverse total shoulder replacement, right  Estimated body mass index is 35.33 kg/m as calculated from the following:   Height as of this encounter: 5' 1"  (1.549 m).   Weight as of this encounter: 84.8 kg. Advance diet Up with therapy D/C IV fluids  When tolerating po intake.  Labs reviewed this AM. Up with therapy this morning. Plan for discharge home this afternoon. Patient is passing gas without pain. Patient states she has hydrocodone at home and tramadol, will discharge home with toradol.  DVT Prophylaxis - Lovenox, Foot Pumps and TED hose Non-weightbearing to the right arm.  Raquel James, PA-C [facility] 04/19/2019, 7:52 AM

## 2019-04-19 NOTE — TOC Transition Note (Addendum)
Transition of Care [facility]) - CM/SW Discharge Note   Patient Details  Name: Nicole Bass MRN: [id number] Date of Birth: [birth year]  [facility] CM/SW Contact:  Su Hilt, RN Phone Number: 04/19/2019, 10:24 AM   Clinical Narrative:     Met with the patient to discuss DC polan and needs She lives at home with her husband and he provides transportation She has had [facility] in the past and not sure who she used but would like to use them again, I notified Corene Cornea with [facility] to check and see if it was them and also Helene Kelp with [facility] to see if it was them She would like to use the same company and she had a PT named Cyril Mourning, she would also like to use her again  [facility] has not had him Helene Kelp states their system only goes back 1 year, the patient agreed to use [facility] She has a RW and a cane at home but would benefit from a 3 in 1, I notified Brad with [facility] and it will be brought to the room She can afford her medications and is up to date with her PCP  Final next level of care: [place] Barriers to Discharge: Continued Medical Work up   Patient Goals and CMS Choice Patient states their goals for this hospitalization and ongoing recovery are:: go home CMS Medicare.gov Compare Post Acute Care list provided to:: Patient Choice offered to / list presented to : Patient  Discharge Placement                       Discharge Plan and Services   Discharge Planning Services: CM Consult Post Acute Care Choice: Home Health          DME Arranged: 3-N-1 DME Agency: [facility] Date DME Agency Contacted: 04/19/19 Time DME Agency Contacted: [id number] Representative spoke with at DME Agency: Anton Ruiz: PT, OT [place] Agency: [place] ([place]) Date [place]: 04/19/19 Time [place]: [place] Representative spoke with at [place]: [place] ([place]) Interventions     Readmission Risk  Interventions No flowsheet data found.

## 2019-04-19 NOTE — Progress Notes (Signed)
Order received for Toradol 7.5 mg IV once

## 2019-04-19 NOTE — Evaluation (Signed)
Occupational Therapy Evaluation Patient Details Name: Nicole Bass MRN: [id number] DOB: 1951/05/09 Today's Date: 04/19/2019    History of Present Illness Patient is 68 yo female s/p right  reverse total shoulder arthroplasty. PMH HTN, GERD, RA, neuropathy, previous back surgeries   Clinical Impression   Nicole Bass (pronounced "Blay-lock") was seen for an OT evaluation this date. Pt lives with her husband in a 2 level home, but she is able to live on the main floor. Prior to surgery, pt was active and independent. Pt has orders for RUE to be immobilized and will be NWBing per MD. Patient presents with impaired strength/ROM, pain, and sensation to RUE with block not completely resolved yet. These impairments result in a decreased ability to perform self care tasks requiring mod assist for UB/LB dressing and bathing and max assist for application of polar care, compression stockings, and sling/immobilizer. Pt instructed in polar care mgt, compression stockings mgt, sling/immobilizer mgt, ROM exercises for RUE (with instructions for no shoulder exercises until full sensation has returned), RUE precautions, adaptive strategies for bathing/dressing/toileting/grooming, positioning and considerations for sleep, and home/routines modifications to maximize falls prevention, safety, and independence. Handout provided. OT adjusted sling/immobilizer and polar care to improve comfort, optimize positioning, and to maximize skin integrity/safety. Pt verbalized understanding of all education/training provided. Pt will benefit from skilled OT services to address these limitations and improve independence in daily tasks. Recommend HHOT services to continue therapy to maximize return to PLOF, address home/routines modifications and safety, minimize falls risk, and minimize caregiver burden.       Follow Up Recommendations  Home health OT    Equipment Recommendations  3 in 1 bedside commode    Recommendations for  Other Services       Precautions / Restrictions Precautions Precautions: Fall;Shoulder Shoulder Interventions: Shoulder sling/immobilizer;Shoulder abduction pillow;At all times;Off for dressing/bathing/exercises Restrictions Weight Bearing Restrictions: Yes RUE Weight Bearing: Non weight bearing      Mobility Bed Mobility               General bed mobility comments: Deferred. Pt up in recliner at start/end of session. Suspect Mod I.  Transfers Overall transfer level: Needs assistance   Transfers: Sit to/from Stand Sit to Stand: Supervision         General transfer comment: Pt able to complete multiple STS tfs independently t/o OT evaluation during dressing tasks. No assist needed. Supervision for safety.    Balance Overall balance assessment: Needs assistance Sitting-balance support: Feet supported Sitting balance-Leahy Scale: Good Sitting balance - Comments: Steady sitting, reaching w/in BOS with LUE.     Standing balance-Leahy Scale: Good Standing balance comment: Good static and dynamic balance during functional activity.                           ADL either performed or assessed with clinical judgement   ADL                                         General ADL Comments: Pt req. mod assist for UB ADL mgt including dressing and bathing tasks due to pain and NWB status through RUE. Max assist for application of polar care, sling/immobilizer, and compression stockings. Min assist for mgt of LB clothing when pulling up pants and underwear.     Vision Baseline Vision/History: Wears glasses Wears Glasses: At all times  Patient Visual Report: No change from baseline       Perception     Praxis      Pertinent Vitals/Pain Faces Pain Scale: Hurts little more Pain Location: Pt endorses no pain at rest, with ADL mgt and mobility pain increases modeately Pain Descriptors / Indicators: Grimacing;Guarding Pain Intervention(s): Limited  activity within patient's tolerance;Monitored during session;Repositioned;Premedicated before session;Ice applied     Hand Dominance Right   Extremity/Trunk Assessment Upper Extremity Assessment Upper Extremity Assessment: RUE deficits/detail RUE Deficits / Details: s/p  reverse TSA; NWB RUE: Unable to fully assess due to pain;Unable to fully assess due to immobilization LUE Deficits / Details: WFLs   Lower Extremity Assessment Lower Extremity Assessment: Overall WFL for tasks assessed;Defer to PT evaluation   Cervical / Trunk Assessment Cervical / Trunk Assessment: Normal   Communication Communication Communication: No difficulties   Cognition Arousal/Alertness: Awake/alert Behavior During Therapy: WFL for tasks assessed/performed Overall Cognitive Status: Within Functional Limits for tasks assessed                                     General Comments  Some bruising noted around incision site RUE.    Exercises Other Exercises Other Exercises: Pt educated on falls prevention strategies, polar care mgt, sling immobilizer mgt, compression stocking mgt, compensatory strategies for bathing and dressing, shoulder precautions, and ROM exercises for RUE. Handout provided. Other Exercises: Assisted pt with dressing UB and LB x25 min.   Shoulder Instructions      Home Living Family/patient expects to be discharged to:: Private residence Living Arrangements: Spouse/significant other Available Help at Discharge: Family Type of Home: House Home Access: Stairs to enter [employer] of Steps: 3 Entrance Stairs-Rails: Can reach both Home Layout: Two level;Able to live on main level with bedroom/bathroom Alternate Level Stairs-Number of Steps: Flight   Bathroom Shower/Tub: Occupational psychologist: Standard     Home Equipment: Environmental consultant - 2 wheels;Cane - single point;Hand held shower head;Adaptive equipment Adaptive Equipment: Reacher         Prior Functioning/Environment Level of Independence: Independent        Comments: Pt only ever uses AD when she has rheumatoid flare up (R LE), otherwise out of the home daily and able to be very independent/active        OT Problem List: Decreased strength;Decreased coordination;Pain;Impaired UE functional use;Decreased knowledge of precautions;Decreased knowledge of use of DME or AE;Impaired balance (sitting and/or standing);Decreased activity tolerance;Decreased safety awareness;Decreased range of motion      OT Treatment/Interventions: Self-care/ADL training;Balance training;Therapeutic exercise;Therapeutic activities;DME and/or AE instruction;Patient/family education    OT Goals(Current goals can be found in the care plan section) Acute Rehab OT Goals Patient Stated Goal: to go home OT Goal Formulation: With patient Time For Goal Achievement: 05/03/19 Potential to Achieve Goals: Good ADL Goals Pt Will Perform Grooming: sitting;with supervision;with set-up(With LRAD PRN for improved safety and functional independence.) Pt Will Perform Upper Body Bathing: sitting;with min assist(With LRAD PRN for improved safety and functional independence.) Pt Will Perform Upper Body Dressing: sitting;with min assist;with adaptive equipment(With LRAD PRN for improved safety and functional independence.)  OT Frequency: Min 1X/week   Barriers to D/C:            Co-evaluation              AM-PAC OT "6 Clicks" Daily Activity     Outcome Measure Help from another  person eating meals?: A Little Help from another person taking care of personal grooming?: A Little Help from another person toileting, which includes using toliet, bedpan, or urinal?: A Little Help from another person bathing (including washing, rinsing, drying)?: A Little Help from another person to put on and taking off regular upper body clothing?: A Little Help from another person to put on and taking off regular lower body  clothing?: A Little 6 Click Score: 18   End of Session Equipment Utilized During Treatment: Gait belt  Activity Tolerance: Patient tolerated treatment well Patient left: in chair;with call bell/phone within reach;with chair alarm set;Other (comment)(With polar care re-applied)  OT Visit Diagnosis: Other abnormalities of gait and mobility (R26.89);Pain Pain - Right/Left: Right Pain - part of body: Shoulder;Arm                Time: 3716-9678 OT Time Calculation (min): 50 min Charges:  OT General Charges $OT Visit: 1 Visit OT Evaluation $OT Eval Low Complexity: 1 Low OT Treatments $Self Care/Home Management : 38-52 mins  Shara Blazing, M.S., OTR/L Ascom: [phone] 04/19/19, 9:49 AM

## 2019-04-19 NOTE — Progress Notes (Signed)
Physical Therapy Treatment Patient Details Name: Nicole Bass MRN: [id number] DOB: [birth year] Today's Date: 04/19/2019    History of Present Illness Patient is 68 yo female s/p right  reverse total shoulder arthroplasty. PMH HTN, GERD, RA, neuropathy, previous back surgeries    PT Comments    Patient up in recliner, no complaints of pain. The patient ambulated >476f mod I, decreased gait velocity over time and SOB noted, vitals WFLs. No LOB noted. The patient was also able to navigate steps with supervision. PT and pt discussed RUE precautions and importance of continued mobility and skilled PT intervention. The patient would benefit from continued rehab to progress pt and return to PLOF as able per protocol.     Follow Up Recommendations  Home health PT     Equipment Recommendations  None recommended by PT    Recommendations for Other Services OT consult     Precautions / Restrictions Precautions Precautions: Fall;Shoulder Shoulder Interventions: Shoulder sling/immobilizer;Shoulder abduction pillow;At all times;Off for dressing/bathing/exercises Precaution Booklet Issued: No Restrictions Weight Bearing Restrictions: Yes RUE Weight Bearing: Non weight bearing    Mobility  Bed Mobility               General bed mobility comments: deferred up in recliner  Transfers Overall transfer level: Modified independent   Transfers: Sit to/from Stand Sit to Stand: Supervision         General transfer comment: use of LUE, overall safe  Ambulation/Gait Ambulation/Gait assistance: Modified independent (Device/Increase time) Gait Distance (Feet): 450 Feet Assistive device: None           Stairs Stairs: Yes Stairs assistance: Supervision Stair Management: One rail Right;One rail Left Number of Stairs: 3     Wheelchair Mobility    Modified Rankin (Stroke Patients Only)       Balance Overall balance assessment: Needs assistance Sitting-balance  support: Feet supported Sitting balance-Leahy Scale: Good Sitting balance - Comments: Steady sitting, reaching w/in BOS with LUE.     Standing balance-Leahy Scale: Good Standing balance comment: no balance concerns at this time                            Cognition Arousal/Alertness: Awake/alert Behavior During Therapy: WFL for tasks assessed/performed Overall Cognitive Status: Within Functional Limits for tasks assessed                                        Exercises Other Exercises Other Exercises: Pt educated on falls prevention strategies, polar care mgt, sling immobilizer mgt, compression stocking mgt, compensatory strategies for bathing and dressing, shoulder precautions, and ROM exercises for RUE. Handout provided. Other Exercises: Assisted pt with dressing UB and LB x25 min.    General Comments General comments (skin integrity, edema, etc.): Some bruising noted around incision site RUE.      Pertinent Vitals/Pain Pain Assessment: No/denies pain Faces Pain Scale: Hurts little more Pain Location: Pt endorses no pain at rest, with ADL mgt and mobility pain increases modeately Pain Descriptors / Indicators: Grimacing;Guarding Pain Intervention(s): Limited activity within patient's tolerance;Monitored during session;Repositioned;Premedicated before session;Ice applied    Home Living Family/patient expects to be discharged to:: Private residence Living Arrangements: Spouse/significant other Available Help at Discharge: Family Type of Home: House Home Access: Stairs to enter Entrance Stairs-Rails: Can reach both Home Layout: Two level;Able to live on main level with  bedroom/bathroom Home Equipment: Walker - 2 wheels;Cane - single point;Hand held shower head;Adaptive equipment      Prior Function Level of Independence: Independent      Comments: Pt only ever uses AD when she has rheumatoid flare up (R LE), otherwise out of the home daily and  able to be very independent/active   PT Goals (current goals can now be found in the care plan section) Acute Rehab PT Goals Patient Stated Goal: to go home Progress towards PT goals: Progressing toward goals    Frequency    BID      PT Plan Current plan remains appropriate    Co-evaluation              AM-PAC PT "6 Clicks" Mobility   Outcome Measure  Help needed turning from your back to your side while in a flat bed without using bedrails?: A Little Help needed moving from lying on your back to sitting on the side of a flat bed without using bedrails?: A Little Help needed moving to and from a bed to a chair (including a wheelchair)?: A Little Help needed standing up from a chair using your arms (e.g., wheelchair or bedside chair)?: A Little Help needed to walk in hospital room?: A Little Help needed climbing 3-5 steps with a railing? : A Little 6 Click Score: 18    End of Session Equipment Utilized During Treatment: Gait belt Activity Tolerance: Patient tolerated treatment well Patient left: in chair;with chair alarm set;with call bell/phone within reach;with SCD's reapplied Nurse Communication: Mobility status PT Visit Diagnosis: Unsteadiness on feet (R26.81);Other abnormalities of gait and mobility (R26.89);Muscle weakness (generalized) (M62.81);Difficulty in walking, not elsewhere classified (R26.2);Other symptoms and signs involving the nervous system (R29.898);Pain Pain - Right/Left: Right Pain - part of body: Shoulder     Time: 8616-8372 PT Time Calculation (min) (ACUTE ONLY): 14 min  Charges:  $Therapeutic Exercise: 8-22 mins                     Lieutenant Diego PT, DPT 12:36 PM,04/19/19 [phone]

## 2019-04-19 NOTE — Progress Notes (Signed)
Discharge instructions and prescriptions reviewed with patient and husband. Verbalization of understanding received. Time allowed for questions. IV discontinued without difficulty. Both patient and husband shown how to fill and operate polar care. Patient provided with extra dressings. Wheeled outside via nursing for discharge to her husband.

## 2019-04-19 NOTE — Discharge Instructions (Signed)
Diet: As you were doing prior to hospitalization   Shower:  May shower but keep the wounds dry, use an occlusive plastic wrap, NO SOAKING IN TUB.  If the bandage gets wet, change with a clean dry gauze.  Dressing:  You may change your dressing as needed. Change the dressing with sterile gauze dressing.    Activity:  Increase activity slowly as tolerated, but follow the weight bearing instructions below.  No lifting or driving for 6 weeks.  Weight Bearing:   Non-weightbearing to the right arm.  Blood Clot Prevention: Take 1 388m aspirin daily.  Pain meds: Toradol 4 times a day, every 6 hours.  Can take hydrocodone or tramdol in between doses if needed.  To prevent constipation: you may use a stool softener such as -  Colace (over the counter) 100 mg by mouth twice a day  Drink plenty of fluids (prune juice may be helpful) and high fiber foods Miralax (over the counter) for constipation as needed.    Itching:  If you experience itching with your medications, try taking only a single pain pill, or even half a pain pill at a time.  You may take up to 10 pain pills per day, and you can also use benadryl over the counter for itching or also to help with sleep.   Precautions:  If you experience chest pain or shortness of breath - call 911 immediately for transfer to the hospital emergency department!!  If you develop a fever greater that 101 F, purulent drainage from wound, increased redness or drainage from wound, or calf pain-Call [facility]                                             Follow- Up Appointment:  [facility]

## 2019-05-14 ENCOUNTER — Ambulatory Visit: Payer: Medicare Other | Admitting: Oncology

## 2019-05-14 ENCOUNTER — Ambulatory Visit: Payer: Medicare Other

## 2019-05-14 ENCOUNTER — Other Ambulatory Visit: Payer: Medicare Other

## 2019-05-27 NOTE — Progress Notes (Deleted)
[place]  Telephone:(336) [phone] Fax:(336) [phone]  ID: Nicole Bass OB: 06/25/1951  MR#: [id number]  XTA#:[id number]  Patient Care Team: [name], MD as PCP - General (Internal Medicine)  CHIEF COMPLAINT: Iron deficiency anemia.  INTERVAL HISTORY: Patient returns to clinic today for repeat laboratory work and further evaluation.  She continues to have chronic weakness and fatigue.  She also has occasional dyspnea on exertion and mild dizziness upon standing.  She has no other neurologic complaints.  She denies any recent fevers or illnesses.  She has good appetite and denies weight loss.  She denies any chest pain, shortness of breath, cough, or hemoptysis.  She denies any nausea, vomiting, constipation, or diarrhea.  She has no melena or hematochezia.  She has no urinary complaints.  Patient offers no further specific complaints today.  REVIEW OF SYSTEMS:   Review of Systems  Constitutional: Positive for malaise/fatigue. Negative for fever and weight loss.  Respiratory: Negative.  Negative for cough, hemoptysis and shortness of breath.   Cardiovascular: Negative.  Negative for chest pain and leg swelling.  Gastrointestinal: Negative.  Negative for abdominal pain, blood in stool, nausea and vomiting.  Genitourinary: Negative.  Negative for hematuria.  Musculoskeletal: Negative.  Negative for back pain.  Skin: Negative.  Negative for rash.  Neurological: Positive for dizziness and weakness. Negative for focal weakness and headaches.  Psychiatric/Behavioral: Negative.  The patient is not nervous/anxious.     As per HPI. Otherwise, a complete review of systems is negative.  PAST MEDICAL HISTORY: Past Medical History:  Diagnosis Date  . Anemia   . Arthritis    rheumatoid  . Collagen vascular disease ([place])   . Crohn's disease ([place])   . DDD (degenerative disc disease), cervical   . DDD (degenerative disc disease), cervical   . DDD (degenerative  disc disease), cervical 2003  . DDD (degenerative disc disease), lumbar   . Degenerative disc disease, cervical   . GERD (gastroesophageal reflux disease)   . Hypertension   . Neuromuscular disorder ([place])   . Neuropathy     PAST SURGICAL HISTORY: Past Surgical History:  Procedure Laterality Date  . ABDOMINAL HYSTERECTOMY    . APPENDECTOMY    . BACK SURGERY     x 3  . CERVICAL SPINE SURGERY    . [place]  . CHOLECYSTECTOMY    . COLON SURGERY  1999   removed 18 inches of colon  . DILATION AND CURETTAGE OF UTERUS  2004  . ELBOW FRACTURE SURGERY  2008  . JOINT REPLACEMENT Right 2015   partial knee replacement  . KYPHOPLASTY    . KYPHOPLASTY N/A 04/14/2015   Procedure: KYPHOPLASTY;  Surgeon: Hessie Knows, MD;  Location: [facility];  Service: Orthopedics;  Laterality: N/A;  . KYPHOPLASTY N/A 06/04/2015   Procedure: KYPHOPLASTY L-5;  Surgeon: Hessie Knows, MD;  Location: [facility];  Service: Orthopedics;  Laterality: N/A;  . MEDIAL PARTIAL KNEE REPLACEMENT    . OOPHORECTOMY Bilateral   . REPAIR EXTENSOR TENDON Left 01/08/2019   Procedure: Realignment  EXTENSOR TENDON;  Surgeon: Hessie Knows, MD;  Location: [facility];  Service: Orthopedics;  Laterality: Left;  . REVERSE SHOULDER ARTHROPLASTY Right 04/18/2019   Procedure: REVERSE SHOULDER ARTHROPLASTY;  Surgeon: Corky Mull, MD;  Location: [facility];  Service: Orthopedics;  Laterality: Right;  . SHOULDER ARTHROSCOPY WITH ROTATOR CUFF REPAIR AND SUBACROMIAL DECOMPRESSION Right 04/13/2016   Procedure: SHOULDER ARTHROSCOPY WITH ROTATOR CUFF REPAIR AND SUBACROMIAL DECOMPRESSION,  release of long head biceps tendon;  Surgeon: Leanor Kail, MD;  Location: [facility];  Service: Orthopedics;  Laterality: Right;  . TOE SURGERY  2013  . TONSILLECTOMY    . WRIST FRACTURE SURGERY Bilateral     FAMILY HISTORY: Family History  Problem Relation Age of Onset  . Diabetes Mother   . Emphysema Mother   . Emphysema Father   . Colon  cancer Maternal Grandfather   . Breast cancer Neg Hx   . Ovarian cancer Neg Hx     ADVANCED DIRECTIVES (Y/N):  N  HEALTH MAINTENANCE: Social History   Tobacco Use  . Smoking status: Never Smoker  . Smokeless tobacco: Never Used  Substance Use Topics  . Alcohol use: No  . Drug use: No     Colonoscopy:  PAP:  Bone density:  Lipid panel:  Allergies  Allergen Reactions  . Ciprofloxacin Itching and Rash  . Levofloxacin Hives and Rash  . Methotrexate Derivatives     Mouth and throat ulcers  . Sulfa Antibiotics Other (See Comments)    Per pts MD she is unable to take this medication because it is contraindicated with her other medications.  . Cefazolin Itching and Rash  . Cefprozil Rash  . Cephalosporins Itching and Rash  . Enbrel [Etanercept] Itching and Rash  . Humira [Adalimumab] Itching and Rash  . Hydrocodone-Acetaminophen Itching and Other (See Comments)    Itching only  . Lorabid [Loracarbef] Itching and Rash  . Meropenem Itching and Rash  . Oxycodone Itching and Other (See Comments)  . Oxycodone-Acetaminophen Itching  . Primidone Itching and Rash    Current Outpatient Medications  Medication Sig Dispense Refill  . acetaminophen (TYLENOL 8 HOUR ARTHRITIS PAIN) 650 MG CR tablet Take 1,300 mg by mouth every 8 (eight) hours as needed for pain.    Marland Kitchen alendronate (FOSAMAX) 70 MG tablet Take 70 mg by mouth every Monday. Take with a full glass of water on an empty stomach.     Marland Kitchen amitriptyline (ELAVIL) 10 MG tablet Take 10 mg by mouth at bedtime.    Marland Kitchen aspirin EC 325 MG tablet Take 1 tablet (325 mg total) by mouth daily. 30 tablet 0  . b complex vitamins tablet Take 1 tablet by mouth daily.    . cetirizine (ZYRTEC) 10 MG tablet Take 10 mg by mouth daily.    . cholecalciferol (VITAMIN D3) 25 MCG (1000 UT) tablet Take 1,000 Units by mouth daily.    . cyanocobalamin (,VITAMIN B-12,) 1000 MCG/ML injection Inject 1,000 mcg into the muscle every 30 (thirty) days.    .  diazepam (VALIUM) 10 MG tablet Take 1 tablet (10 mg total) by mouth daily as needed (vaginal pain with intercourse, use before sexual activity per vagina). 30 tablet 0  . diphenoxylate-atropine (LOMOTIL) 2.5-0.025 MG per tablet Take 2 tablets by mouth daily.     . ferrous sulfate 325 (65 FE) MG tablet Take 1 tablet (325 mg total) by mouth 2 (two) times daily with a meal. (Patient taking differently: Take 325 mg by mouth at bedtime. ) 60 tablet 0  . furosemide (LASIX) 20 MG tablet Take 20 mg by mouth daily.     Marland Kitchen gabapentin (NEURONTIN) 400 MG capsule Take 400 mg by mouth at bedtime.    Marland Kitchen ketorolac (TORADOL) 10 MG tablet Take 1 tablet (10 mg total) by mouth every 6 (six) hours as needed. 30 tablet 0  . magnesium oxide (MAG-OX) 400 MG tablet Take 400 mg by mouth  daily.     . methimazole (TAPAZOLE) 5 MG tablet Take 2.5 mg by mouth every Monday, Wednesday, and Friday.     . Multiple Vitamin (MULTIVITAMIN WITH MINERALS) TABS tablet Take 1 tablet by mouth daily.    . naproxen (NAPROSYN) 500 MG tablet Take 500 mg by mouth 2 (two) times daily as needed for moderate pain.    Marland Kitchen omeprazole (PRILOSEC) 40 MG capsule Take 40 mg by mouth daily before breakfast.     . potassium chloride SA (K-DUR,KLOR-CON) 20 MEQ tablet Take 20 mEq by mouth daily.     . predniSONE (DELTASONE) 1 MG tablet Take 4 mg by mouth daily with breakfast.    . Probiotic Product (PROBIOTIC PO) Take 1 capsule by mouth daily.     Marland Kitchen rOPINIRole (REQUIP) 1 MG tablet Take 1 mg by mouth at bedtime as needed (for restless legs).     . sucralfate (CARAFATE) 1 g tablet Take 0.5 g by mouth 3 (three) times daily with meals as needed (for stomach cramping (crohn's disease)).     Marland Kitchen telmisartan (MICARDIS) 40 MG tablet Take 20 mg by mouth every Monday, Wednesday, and Friday.     . Tofacitinib Citrate (XELJANZ) 5 MG TABS Take 5 mg by mouth 2 (two) times daily.     . traMADol (ULTRAM) 50 MG tablet Take 100 mg by mouth 3 (three) times daily as needed for  moderate pain.      No current facility-administered medications for this visit.     OBJECTIVE: There were no vitals filed for this visit.   There is no height or weight on file to calculate BMI.    ECOG FS:0 - Asymptomatic  General: Well-developed, well-nourished, no acute distress. Eyes: Pink conjunctiva, anicteric sclera. HEENT: Normocephalic, moist mucous membranes. Lungs: Clear to auscultation bilaterally. Heart: Regular rate and rhythm. No rubs, murmurs, or gallops. Abdomen: Soft, nontender, nondistended. No organomegaly noted, normoactive bowel sounds. Musculoskeletal: No edema, cyanosis, or clubbing. Neuro: Alert, answering all questions appropriately. Cranial nerves grossly intact. Skin: No rashes or petechiae noted. Psych: Normal affect.   LAB RESULTS:  Lab Results  Component Value Date   NA 141 04/19/2019   K 3.9 04/19/2019   CL 112 (H) 04/19/2019   CO2 22 04/19/2019   GLUCOSE 194 (H) 04/19/2019   BUN 23 04/19/2019   CREATININE 1.06 (H) 04/19/2019   CALCIUM 8.1 (L) 04/19/2019   PROT 6.6 08/23/2018   ALBUMIN 3.4 (L) 08/23/2018   AST 27 08/23/2018   ALT 20 08/23/2018   ALKPHOS 58 08/23/2018   BILITOT 0.6 08/23/2018   GFRNONAA 55 (L) 04/19/2019   GFRAA >60 04/19/2019    Lab Results  Component Value Date   WBC 12.4 (H) 04/19/2019   NEUTROABS 10.9 (H) 04/19/2019   HGB 9.2 (L) 04/19/2019   HCT 28.4 (L) 04/19/2019   MCV 99.3 04/19/2019   PLT 191 04/19/2019   Lab Results  Component Value Date   IRON 103 02/12/2019   TIBC 318 02/12/2019   IRONPCTSAT 32 (H) 02/12/2019   Lab Results  Component Value Date   FERRITIN 799 (H) 02/12/2019     STUDIES: No results found.  ASSESSMENT: Iron deficiency anemia.  PLAN:   1.  Iron deficiency anemia: Although patient has noted no melena or hematochezia, this is likely related to her Crohn's disease.  She reports she gets regular colonoscopies.  Patient's iron stores are within normal limits, but her hemoglobin  remains decreased and she is symptomatic.  Will proceed  with one infusion of 510 mg IV Feraheme today.  Return to clinic in 3 months with repeat laboratory work, further evaluation, and continuation of treatment if necessary.   2.  Crohn's disease: Continue follow-up and treatment with GI. 3.  Elevated ferritin: Likely secondary to acute phase reactant.  Monitor.  I spent a total of 30 minutes face-to-face with the patient of which greater than 50% of the visit was spent in counseling and coordination of care as detailed above.   Patient expressed understanding and was in agreement with this plan. She also understands that She can call clinic at any time with any questions, concerns, or complaints.    Lloyd Huger, MD   05/27/2019 9:25 PM

## 2019-05-31 ENCOUNTER — Inpatient Hospital Stay: Payer: Medicare Other | Admitting: Oncology

## 2019-05-31 ENCOUNTER — Inpatient Hospital Stay: Payer: Medicare Other

## 2019-06-16 NOTE — Progress Notes (Signed)
[place]  Telephone:(336) [phone] Fax:(336) [phone]  ID: Nicole Bass OB: 02-Sep-1950  MR#: [id number]  IOE#:[id number]  Patient Care Team: [name], MD as PCP - General (Internal Medicine)  CHIEF COMPLAINT: Iron deficiency anemia.  INTERVAL HISTORY: Patient returns to clinic today for repeat laboratory work, further evaluation, and consideration of additional IV Feraheme.  She continues to have chronic weakness and fatigue, but admits this is mildly improved since receiving IV iron 3 months ago.  She recently had surgery on her shoulder and hand.  She continues to complain of dyspnea exertion and occasional dizziness.  She has left foot pain secondary to a possible stress fracture. She has no other neurologic complaints.  She denies any recent fevers or illnesses.  She has good appetite and denies weight loss.  She denies any chest pain, shortness of breath, cough, or hemoptysis.  She denies any nausea, vomiting, constipation, or diarrhea.  She has no melena or hematochezia.  She has no urinary complaints.  Patient offers no further specific complaints today.  REVIEW OF SYSTEMS:   Review of Systems  Constitutional: Positive for malaise/fatigue. Negative for fever and weight loss.  Respiratory: Negative.  Negative for cough, hemoptysis and shortness of breath.   Cardiovascular: Negative.  Negative for chest pain and leg swelling.  Gastrointestinal: Negative.  Negative for abdominal pain, blood in stool, nausea and vomiting.  Genitourinary: Positive for frequency. Negative for hematuria.  Musculoskeletal: Positive for joint pain. Negative for back pain.  Skin: Negative.  Negative for rash.  Neurological: Positive for dizziness and weakness. Negative for focal weakness and headaches.  Psychiatric/Behavioral: Negative.  The patient is not nervous/anxious.     As per HPI. Otherwise, a complete review of systems is negative.  PAST MEDICAL HISTORY: Past  Medical History:  Diagnosis Date  . Anemia   . Arthritis    rheumatoid  . Collagen vascular disease ([place])   . Crohn's disease ([place])   . DDD (degenerative disc disease), cervical   . DDD (degenerative disc disease), cervical   . DDD (degenerative disc disease), cervical 2003  . DDD (degenerative disc disease), lumbar   . Degenerative disc disease, cervical   . GERD (gastroesophageal reflux disease)   . Hypertension   . Neuromuscular disorder ([place])   . Neuropathy     PAST SURGICAL HISTORY: Past Surgical History:  Procedure Laterality Date  . ABDOMINAL HYSTERECTOMY    . APPENDECTOMY    . BACK SURGERY     x 3  . CERVICAL SPINE SURGERY    . [place]  . CHOLECYSTECTOMY    . COLON SURGERY  1999   removed 18 inches of colon  . DILATION AND CURETTAGE OF UTERUS  2004  . ELBOW FRACTURE SURGERY  2008  . JOINT REPLACEMENT Right 2015   partial knee replacement  . KYPHOPLASTY    . KYPHOPLASTY N/A 04/14/2015   Procedure: KYPHOPLASTY;  Surgeon: Hessie Knows, MD;  Location: [facility];  Service: Orthopedics;  Laterality: N/A;  . KYPHOPLASTY N/A 06/04/2015   Procedure: KYPHOPLASTY L-5;  Surgeon: Hessie Knows, MD;  Location: [facility];  Service: Orthopedics;  Laterality: N/A;  . MEDIAL PARTIAL KNEE REPLACEMENT    . OOPHORECTOMY Bilateral   . REPAIR EXTENSOR TENDON Left 01/08/2019   Procedure: Realignment  EXTENSOR TENDON;  Surgeon: Hessie Knows, MD;  Location: [facility];  Service: Orthopedics;  Laterality: Left;  . REVERSE SHOULDER ARTHROPLASTY Right 04/18/2019   Procedure: REVERSE SHOULDER ARTHROPLASTY;  Surgeon: Corky Mull, MD;  Location: [facility];  Service: Orthopedics;  Laterality: Right;  . SHOULDER ARTHROSCOPY WITH ROTATOR CUFF REPAIR AND SUBACROMIAL DECOMPRESSION Right 04/13/2016   Procedure: SHOULDER ARTHROSCOPY WITH ROTATOR CUFF REPAIR AND SUBACROMIAL DECOMPRESSION, release of long head biceps tendon;  Surgeon: Leanor Kail, MD;  Location: [facility];  Service:  Orthopedics;  Laterality: Right;  . TOE SURGERY  2013  . TONSILLECTOMY    . WRIST FRACTURE SURGERY Bilateral     FAMILY HISTORY: Family History  Problem Relation Age of Onset  . Diabetes Mother   . Emphysema Mother   . Emphysema Father   . Colon cancer Maternal Grandfather   . Breast cancer Neg Hx   . Ovarian cancer Neg Hx     ADVANCED DIRECTIVES (Y/N):  N  HEALTH MAINTENANCE: Social History   Tobacco Use  . Smoking status: Never Smoker  . Smokeless tobacco: Never Used  Substance Use Topics  . Alcohol use: No  . Drug use: No     Colonoscopy:  PAP:  Bone density:  Lipid panel:  Allergies  Allergen Reactions  . Ciprofloxacin Itching and Rash  . Levofloxacin Hives and Rash  . Methotrexate Derivatives     Mouth and throat ulcers  . Sulfa Antibiotics Other (See Comments)    Per pts MD she is unable to take this medication because it is contraindicated with her other medications.  . Cefazolin Itching and Rash  . Cefprozil Rash  . Cephalosporins Itching and Rash  . Enbrel [Etanercept] Itching and Rash  . Humira [Adalimumab] Itching and Rash  . Hydrocodone-Acetaminophen Itching and Other (See Comments)    Itching only  . Lorabid [Loracarbef] Itching and Rash  . Meropenem Itching and Rash  . Oxycodone Itching and Other (See Comments)  . Oxycodone-Acetaminophen Itching  . Primidone Itching and Rash    Current Outpatient Medications  Medication Sig Dispense Refill  . acetaminophen (TYLENOL 8 HOUR ARTHRITIS PAIN) 650 MG CR tablet Take 1,300 mg by mouth every 8 (eight) hours as needed for pain.    Marland Kitchen alendronate (FOSAMAX) 70 MG tablet Take 70 mg by mouth every Monday. Take with a full glass of water on an empty stomach.     Marland Kitchen amitriptyline (ELAVIL) 10 MG tablet Take 10 mg by mouth at bedtime.    Marland Kitchen aspirin EC 325 MG tablet Take 1 tablet (325 mg total) by mouth daily. 30 tablet 0  . b complex vitamins tablet Take 1 tablet by mouth daily.    . cetirizine (ZYRTEC) 10  MG tablet Take 10 mg by mouth daily.    . cholecalciferol (VITAMIN D3) 25 MCG (1000 UT) tablet Take 1,000 Units by mouth daily.    . cyanocobalamin (,VITAMIN B-12,) 1000 MCG/ML injection Inject 1,000 mcg into the muscle every 30 (thirty) days.    . diazepam (VALIUM) 10 MG tablet Take 1 tablet (10 mg total) by mouth daily as needed (vaginal pain with intercourse, use before sexual activity per vagina). 30 tablet 0  . diphenoxylate-atropine (LOMOTIL) 2.5-0.025 MG per tablet Take 2 tablets by mouth daily.     . ferrous sulfate 325 (65 FE) MG tablet Take 1 tablet (325 mg total) by mouth 2 (two) times daily with a meal. (Patient taking differently: Take 325 mg by mouth at bedtime. ) 60 tablet 0  . furosemide (LASIX) 20 MG tablet Take 20 mg by mouth daily.     Marland Kitchen gabapentin (NEURONTIN) 400 MG capsule Take 400 mg by mouth at  bedtime.    Marland Kitchen ketorolac (TORADOL) 10 MG tablet Take 1 tablet (10 mg total) by mouth every 6 (six) hours as needed. 30 tablet 0  . magnesium oxide (MAG-OX) 400 MG tablet Take 400 mg by mouth daily.     . methimazole (TAPAZOLE) 5 MG tablet Take 2.5 mg by mouth every Monday, Wednesday, and Friday.     . Multiple Vitamin (MULTIVITAMIN WITH MINERALS) TABS tablet Take 1 tablet by mouth daily.    . naproxen (NAPROSYN) 500 MG tablet Take 500 mg by mouth 2 (two) times daily as needed for moderate pain.    Marland Kitchen omeprazole (PRILOSEC) 40 MG capsule Take 40 mg by mouth daily before breakfast.     . potassium chloride SA (K-DUR,KLOR-CON) 20 MEQ tablet Take 20 mEq by mouth daily.     . predniSONE (DELTASONE) 1 MG tablet Take 4 mg by mouth daily with breakfast.    . Probiotic Product (PROBIOTIC PO) Take 1 capsule by mouth daily.     Marland Kitchen rOPINIRole (REQUIP) 1 MG tablet Take 1 mg by mouth at bedtime as needed (for restless legs).     . sucralfate (CARAFATE) 1 g tablet Take 0.5 g by mouth 3 (three) times daily with meals as needed (for stomach cramping (crohn's disease)).     Marland Kitchen telmisartan (MICARDIS) 40 MG  tablet Take 20 mg by mouth every Monday, Wednesday, and Friday.     . Tofacitinib Citrate (XELJANZ) 5 MG TABS Take 5 mg by mouth 2 (two) times daily.     . traMADol (ULTRAM) 50 MG tablet Take 100 mg by mouth 3 (three) times daily as needed for moderate pain.      No current facility-administered medications for this visit.    Facility-Administered Medications Ordered in Other Visits  Medication Dose Route Frequency Provider Last Rate Last Dose  . 0.9 %  sodium chloride infusion   Intravenous Continuous Lloyd Huger, MD 10 mL/hr at 06/18/19 1406      OBJECTIVE: Vitals:   06/18/19 1317  BP: 108/64  Temp: 99.1 F (37.3 C)     Body mass index is 37.01 kg/m.    ECOG FS:0 - Asymptomatic  General: Well-developed, well-nourished, no acute distress. Eyes: Pink conjunctiva, anicteric sclera. HEENT: Normocephalic, moist mucous membranes. Lungs: Clear to auscultation bilaterally. Heart: Regular rate and rhythm. No rubs, murmurs, or gallops. Abdomen: Soft, nontender, nondistended. No organomegaly noted, normoactive bowel sounds. Musculoskeletal: No edema, cyanosis, or clubbing. Neuro: Alert, answering all questions appropriately. Cranial nerves grossly intact. Skin: No rashes or petechiae noted. Psych: Normal affect.   LAB RESULTS:  Lab Results  Component Value Date   NA 141 04/19/2019   K 3.9 04/19/2019   CL 112 (H) 04/19/2019   CO2 22 04/19/2019   GLUCOSE 194 (H) 04/19/2019   BUN 23 04/19/2019   CREATININE 1.06 (H) 04/19/2019   CALCIUM 8.1 (L) 04/19/2019   PROT 6.6 08/23/2018   ALBUMIN 3.4 (L) 08/23/2018   AST 27 08/23/2018   ALT 20 08/23/2018   ALKPHOS 58 08/23/2018   BILITOT 0.6 08/23/2018   GFRNONAA 55 (L) 04/19/2019   GFRAA >60 04/19/2019    Lab Results  Component Value Date   WBC 6.8 06/18/2019   NEUTROABS 5.1 06/18/2019   HGB 10.2 (L) 06/18/2019   HCT 31.8 (L) 06/18/2019   MCV 99.4 06/18/2019   PLT 243 06/18/2019   Lab Results  Component Value Date    IRON 79 06/18/2019   TIBC 272 06/18/2019   IRONPCTSAT  29 06/18/2019   Lab Results  Component Value Date   FERRITIN 448 (H) 06/18/2019     STUDIES: No results found.  ASSESSMENT: Iron deficiency anemia.  PLAN:   1.  Iron deficiency anemia: Although patient has noted no melena or hematochezia, this is likely related to her Crohn's disease.  She reports she gets regular colonoscopies.  Patient's iron stores are within normal limits, but her hemoglobin has only mildly improved and she is symptomatic.  We will proceed with 1 infusion of 510 mg IV Feraheme today.  She does not require second infusion.  Return to clinic in 3 months with repeat laboratory work and further evaluation.  2.  Crohn's disease: Continue follow-up and treatment with GI. 3.  Elevated ferritin: Likely secondary to acute phase reactant.  Monitor. 4.  Foot pain: Continue follow-up with podiatry as indicated. 5.  Possible UTI: UA from today is pending.  I spent a total of 30 minutes face-to-face with the patient of which greater than 50% of the visit was spent in counseling and coordination of care as detailed above.  Patient expressed understanding and was in agreement with this plan. She also understands that She can call clinic at any time with any questions, concerns, or complaints.    Lloyd Huger, MD   06/18/2019 2:36 PM

## 2019-06-17 ENCOUNTER — Telehealth: Payer: Self-pay | Admitting: Emergency Medicine

## 2019-06-17 ENCOUNTER — Other Ambulatory Visit: Payer: Self-pay

## 2019-06-17 NOTE — Telephone Encounter (Signed)
Called pt to prescreen for appointment tomorrow. No answer, left message.

## 2019-06-18 ENCOUNTER — Encounter: Payer: Self-pay | Admitting: Oncology

## 2019-06-18 ENCOUNTER — Other Ambulatory Visit: Payer: Self-pay

## 2019-06-18 ENCOUNTER — Inpatient Hospital Stay (HOSPITAL_BASED_OUTPATIENT_CLINIC_OR_DEPARTMENT_OTHER): Payer: Medicare Other | Admitting: Oncology

## 2019-06-18 ENCOUNTER — Inpatient Hospital Stay: Payer: Medicare Other | Attending: Oncology

## 2019-06-18 ENCOUNTER — Inpatient Hospital Stay: Payer: Medicare Other

## 2019-06-18 VITALS — BP 108/64 | Temp 99.1°F | Wt 195.9 lb

## 2019-06-18 DIAGNOSIS — Z87440 Personal history of urinary (tract) infections: Secondary | ICD-10-CM | POA: Insufficient documentation

## 2019-06-18 DIAGNOSIS — R7989 Other specified abnormal findings of blood chemistry: Secondary | ICD-10-CM | POA: Diagnosis not present

## 2019-06-18 DIAGNOSIS — D509 Iron deficiency anemia, unspecified: Secondary | ICD-10-CM

## 2019-06-18 DIAGNOSIS — R531 Weakness: Secondary | ICD-10-CM | POA: Diagnosis not present

## 2019-06-18 DIAGNOSIS — R5383 Other fatigue: Secondary | ICD-10-CM | POA: Diagnosis not present

## 2019-06-18 DIAGNOSIS — N390 Urinary tract infection, site not specified: Secondary | ICD-10-CM | POA: Diagnosis not present

## 2019-06-18 DIAGNOSIS — K5090 Crohn's disease, unspecified, without complications: Secondary | ICD-10-CM | POA: Insufficient documentation

## 2019-06-18 DIAGNOSIS — R42 Dizziness and giddiness: Secondary | ICD-10-CM | POA: Insufficient documentation

## 2019-06-18 DIAGNOSIS — D649 Anemia, unspecified: Secondary | ICD-10-CM

## 2019-06-18 LAB — CBC WITH DIFFERENTIAL/PLATELET
Abs Immature Granulocytes: 0.03 10*3/uL (ref 0.00–0.07)
Basophils Absolute: 0 10*3/uL (ref 0.0–0.1)
Basophils Relative: 1 %
Eosinophils Absolute: 0.1 10*3/uL (ref 0.0–0.5)
Eosinophils Relative: 1 %
HCT: 31.8 % — ABNORMAL LOW (ref 36.0–46.0)
Hemoglobin: 10.2 g/dL — ABNORMAL LOW (ref 12.0–15.0)
Immature Granulocytes: 0 %
Lymphocytes Relative: 14 %
Lymphs Abs: 1 10*3/uL (ref 0.7–4.0)
MCH: 31.9 pg (ref 26.0–34.0)
MCHC: 32.1 g/dL (ref 30.0–36.0)
MCV: 99.4 fL (ref 80.0–100.0)
Monocytes Absolute: 0.6 10*3/uL (ref 0.1–1.0)
Monocytes Relative: 8 %
Neutro Abs: 5.1 10*3/uL (ref 1.7–7.7)
Neutrophils Relative %: 76 %
Platelets: 243 10*3/uL (ref 150–400)
RBC: 3.2 MIL/uL — ABNORMAL LOW (ref 3.87–5.11)
RDW: 12.7 % (ref 11.5–15.5)
WBC: 6.8 10*3/uL (ref 4.0–10.5)
nRBC: 0 % (ref 0.0–0.2)

## 2019-06-18 LAB — URINALYSIS, COMPLETE (UACMP) WITH MICROSCOPIC
Bilirubin Urine: NEGATIVE
Glucose, UA: NEGATIVE mg/dL
Hgb urine dipstick: NEGATIVE
Ketones, ur: NEGATIVE mg/dL
Nitrite: POSITIVE — AB
Protein, ur: 100 mg/dL — AB
Specific Gravity, Urine: 1.02 (ref 1.005–1.030)
WBC, UA: >50 WBC/hpf — ABNORMAL HIGH (ref 0–5)
pH: 5 (ref 5.0–8.0)

## 2019-06-18 LAB — IRON AND TIBC
Iron: 79 ug/dL (ref 28–170)
Saturation Ratios: 29 % (ref 10.4–31.8)
TIBC: 272 ug/dL (ref 250–450)
UIBC: 193 ug/dL

## 2019-06-18 LAB — FERRITIN: Ferritin: 749 ng/mL — ABNORMAL HIGH (ref 11–307)

## 2019-06-18 MED ORDER — SODIUM CHLORIDE 0.9 % IV SOLN
510.0000 mg | Freq: Once | INTRAVENOUS | Status: AC
  Administered 2019-06-18: 510 mg via INTRAVENOUS
  Filled 2019-06-18: qty 17

## 2019-06-18 MED ORDER — SODIUM CHLORIDE 0.9 % IV SOLN
INTRAVENOUS | Status: DC
  Administered 2019-06-18: 14:00:00 via INTRAVENOUS
  Filled 2019-06-18: qty 250

## 2019-06-18 NOTE — Progress Notes (Signed)
Pt reports being tired, having lack of motivation to do anything during the day, SOB. Denies any dizziness. Reports that she has had shoulder surgery since last visit which causes her pain, and has been having pain in her left foot as well. Reports diarrhea since starting naproxen for her foot pain, but also reports recently stopping a course of abx for her UTI.

## 2019-06-19 ENCOUNTER — Other Ambulatory Visit: Payer: Self-pay | Admitting: Oncology

## 2019-06-19 ENCOUNTER — Ambulatory Visit: Payer: Medicare Other | Admitting: Physician Assistant

## 2019-06-19 VITALS — BP 137/75 | HR 108 | Ht 61.0 in | Wt 190.0 lb

## 2019-06-19 DIAGNOSIS — N390 Urinary tract infection, site not specified: Secondary | ICD-10-CM | POA: Diagnosis not present

## 2019-06-19 DIAGNOSIS — N200 Calculus of kidney: Secondary | ICD-10-CM | POA: Diagnosis not present

## 2019-06-19 MED ORDER — NITROFURANTOIN MONOHYD MACRO 100 MG PO CAPS: 100.0000 mg | ORAL_CAPSULE | Freq: Two times a day (BID) | ORAL | 0 refills | Status: DC

## 2019-06-19 MED ORDER — FLUCONAZOLE 200 MG PO TABS: 200.0000 mg | ORAL_TABLET | Freq: Once | ORAL | 0 refills | Status: AC

## 2019-06-19 NOTE — Progress Notes (Signed)
06/19/2019 3:13 PM   [name] 04-08-1951 [id number]  CC: 2 UTIs in 2 weeks  HPI: Nicole Bass is a 68 y.o. female who presents today for evaluation of possible UTI. She is an established [facility] patient who last saw Dr. Erlene Quan on 02/22/2019 for recurrent UTI and dyspareunia.  She was found to have atrophic vaginitis and prescribed topical vaginal estrogen cream at that time.  She is also counseled to start cranberry tablets and probiotics for UTI prevention. Additionally, she is known to have bilateral nonobstructing nephrolithiasis.  She presented to the [facility] emergency department on 05/30/2019 with complaints of left arm pain and numbness with associated left hand weakness and tremors and poor balance.  She was febrile.  PMH significant for Crohn's disease, rheumatoid arthritis, DM, and vitamin B12 deficiency.  She was found to have an elevated ESR (83) and CRP (35.8) and was ultimately discharged home with diagnosis of an RA flare.  UA significant for small leukocyte esterase, 23 WBCs/hpf, few bacteria, and occasional WBC clumps.  She was prescribed Macrobid 100 mg twice daily x7 days.  Patient reports she took this. Urine culture ultimately returned with 10-50k CFUs/mL E. coli, susceptibility testing not performed.  She subsequently sought care with Dr. Grayland Ormond yesterday with complaints of chronic weakness and fatigue.  UA significant for nitrites, large leukocytes esterase, 11-20 RBCs/hpf, and >50 WBCs/hpf.  Urine culture pending.  Dr. Grayland Ormond again prescribed Macrobid which the patient has not yet filled.  Today, she denies dysuria, frequency, gross hematuria, suprapubic pain, and flank pain. She reports urinary urgency at baseline that is stable. She also reports some left back pain that originates under her ribs and travels midline that is exacerbated with bending, twisting, and heavy lifting; she wonders if this is associated with her kidney stones. PMH also  significant for DDD. She states she continues to be fatigued.  In-office UA today positive for trace-lysed blood and 1+ leukocyte esterase; urine microscopy with >30 WBCs/HPF.   She reports numerous drug allergies, including ciprofloxacin, levofloxacin, sulfa antibiotics, cephalosporins, and meropenem.  PMH: Past Medical History:  Diagnosis Date  . Anemia   . Arthritis    rheumatoid  . Collagen vascular disease ([place])   . Crohn's disease ([place])   . DDD (degenerative disc disease), cervical   . DDD (degenerative disc disease), cervical   . DDD (degenerative disc disease), cervical 2003  . DDD (degenerative disc disease), lumbar   . Degenerative disc disease, cervical   . GERD (gastroesophageal reflux disease)   . Hypertension   . Neuromuscular disorder ([place])   . Neuropathy     Surgical History: Past Surgical History:  Procedure Laterality Date  . ABDOMINAL HYSTERECTOMY    . APPENDECTOMY    . BACK SURGERY     x 3  . CERVICAL SPINE SURGERY    . [place]  . CHOLECYSTECTOMY    . COLON SURGERY  1999   removed 18 inches of colon  . DILATION AND CURETTAGE OF UTERUS  2004  . ELBOW FRACTURE SURGERY  2008  . JOINT REPLACEMENT Right 2015   partial knee replacement  . KYPHOPLASTY    . KYPHOPLASTY N/A 04/14/2015   Procedure: KYPHOPLASTY;  Surgeon: Hessie Knows, MD;  Location: [facility] ORS;  Service: Orthopedics;  Laterality: N/A;  . KYPHOPLASTY N/A 06/04/2015   Procedure: KYPHOPLASTY L-5;  Surgeon: Hessie Knows, MD;  Location: [facility] ORS;  Service: Orthopedics;  Laterality: N/A;  . MEDIAL PARTIAL KNEE  REPLACEMENT    . OOPHORECTOMY Bilateral   . REPAIR EXTENSOR TENDON Left 01/08/2019   Procedure: Realignment  EXTENSOR TENDON;  Surgeon: Hessie Knows, MD;  Location: [facility] ORS;  Service: Orthopedics;  Laterality: Left;  . REVERSE SHOULDER ARTHROPLASTY Right 04/18/2019   Procedure: REVERSE SHOULDER ARTHROPLASTY;  Surgeon: Corky Mull, MD;  Location: [facility] ORS;  Service:  Orthopedics;  Laterality: Right;  . SHOULDER ARTHROSCOPY WITH ROTATOR CUFF REPAIR AND SUBACROMIAL DECOMPRESSION Right 04/13/2016   Procedure: SHOULDER ARTHROSCOPY WITH ROTATOR CUFF REPAIR AND SUBACROMIAL DECOMPRESSION, release of long head biceps tendon;  Surgeon: Leanor Kail, MD;  Location: [facility] ORS;  Service: Orthopedics;  Laterality: Right;  . TOE SURGERY  2013  . TONSILLECTOMY    . WRIST FRACTURE SURGERY Bilateral     Home Medications:  Allergies as of 06/19/2019      Reactions   Ciprofloxacin Itching, Rash   Levofloxacin Hives, Rash   Methotrexate Derivatives    Mouth and throat ulcers   Other Other (See Comments), Rash   Other reaction(s): Other (See Comments), Unknown Per pts MD she is unable to take this medication because it is contraindicated with her other medications. Per pts MD she is unable to take this medication because it is contraindicated with her other medications.  Other reaction(s): Other (See Comments), Unknown Other reaction(s): Other (See Comments), Unknown Per pts MD she is unable to take this medication because it is contraindicated with her other medications. Per pts MD she is unable to take this medication because it is contraindicated with her other medications. Other reaction(s): Other (See Comments), Unknown Other reaction(s): Other (See Comments), Unknown Per pts MD she is unable to take this medication because it is contraindicated with her  Per pts MD she is unable to take this medication because it is contraindicated with her other medications.   Sulfa Antibiotics Other (See Comments)   Per pts MD she is unable to take this medication because it is contraindicated with her other medications.   Cefazolin Itching, Rash   Cefprozil Rash   Cephalosporins Itching, Rash   Enbrel [etanercept] Itching, Rash   Humira [adalimumab] Itching, Rash   Hydrocodone-acetaminophen Itching, Other (See Comments)   Itching only   Lorabid [loracarbef]  Itching, Rash   Meropenem Itching, Rash   Oxycodone Itching, Other (See Comments)   Oxycodone-acetaminophen Itching   Primidone Itching, Rash      Medication List       Accurate as of June 19, 2019  3:13 PM. If you have any questions, ask your nurse or doctor.        alendronate 70 MG tablet Commonly known as: FOSAMAX Take 70 mg by mouth every Monday. Take with a full glass of water on an empty stomach.   amitriptyline 10 MG tablet Commonly known as: ELAVIL Take 10 mg by mouth at bedtime.   aspirin EC 325 MG tablet Take 1 tablet (325 mg total) by mouth daily.   b complex vitamins tablet Take 1 tablet by mouth daily.   cetirizine 10 MG tablet Commonly known as: ZYRTEC Take 10 mg by mouth daily.   cholecalciferol 25 MCG (1000 UT) tablet Commonly known as: VITAMIN D3 Take 1,000 Units by mouth daily.   cyanocobalamin 1000 MCG/ML injection Commonly known as: (VITAMIN B-12) Inject 1,000 mcg into the muscle every 30 (thirty) days.   diazepam 10 MG tablet Commonly known as: Valium Take 1 tablet (10 mg total) by mouth daily as needed (vaginal pain with intercourse, use before  sexual activity per vagina).   diphenoxylate-atropine 2.5-0.025 MG tablet Commonly known as: LOMOTIL Take 2 tablets by mouth daily.   ferrous sulfate 325 (65 FE) MG tablet Take 1 tablet (325 mg total) by mouth 2 (two) times daily with a meal. What changed: when to take this   fluconazole 200 MG tablet Commonly known as: DIFLUCAN Take 1 tablet (200 mg total) by mouth once for 1 dose. Started by: Lloyd Huger, MD   furosemide 20 MG tablet Commonly known as: LASIX Take 20 mg by mouth daily.   gabapentin 400 MG capsule Commonly known as: NEURONTIN Take 400 mg by mouth at bedtime.   ketorolac 10 MG tablet Commonly known as: TORADOL Take 1 tablet (10 mg total) by mouth every 6 (six) hours as needed.   magnesium oxide 400 MG tablet Commonly known as: MAG-OX Take 400 mg by mouth  daily.   methimazole 5 MG tablet Commonly known as: TAPAZOLE Take 2.5 mg by mouth every Monday, Wednesday, and Friday.   multivitamin with minerals Tabs tablet Take 1 tablet by mouth daily.   naproxen 500 MG tablet Commonly known as: NAPROSYN Take 500 mg by mouth 2 (two) times daily as needed for moderate pain.   nitrofurantoin (macrocrystal-monohydrate) 100 MG capsule Commonly known as: Macrobid Take 1 capsule (100 mg total) by mouth 2 (two) times daily. Started by: Lloyd Huger, MD   omeprazole 40 MG capsule Commonly known as: PRILOSEC Take 40 mg by mouth daily before breakfast.   potassium chloride SA 20 MEQ tablet Commonly known as: KLOR-CON Take 20 mEq by mouth daily.   predniSONE 1 MG tablet Commonly known as: DELTASONE Take 4 mg by mouth daily with breakfast.   PROBIOTIC PO Take 1 capsule by mouth daily.   rOPINIRole 1 MG tablet Commonly known as: REQUIP Take 1 mg by mouth at bedtime as needed (for restless legs).   sucralfate 1 g tablet Commonly known as: CARAFATE Take 0.5 g by mouth 3 (three) times daily with meals as needed (for stomach cramping (crohn's disease)).   telmisartan 40 MG tablet Commonly known as: MICARDIS Take 20 mg by mouth every Monday, Wednesday, and Friday.   traMADol 50 MG tablet Commonly known as: ULTRAM Take 100 mg by mouth 3 (three) times daily as needed for moderate pain.   Tylenol 8 Hour Arthritis Pain 650 MG CR tablet Generic drug: acetaminophen Take 1,300 mg by mouth every 8 (eight) hours as needed for pain.   Xeljanz 5 MG Tabs Generic drug: Tofacitinib Citrate Take 5 mg by mouth 2 (two) times daily.       Allergies:  Allergies  Allergen Reactions  . Ciprofloxacin Itching and Rash  . Levofloxacin Hives and Rash  . Methotrexate Derivatives     Mouth and throat ulcers  . Other Other (See Comments) and Rash    Other reaction(s): Other (See Comments), Unknown Per pts MD she is unable to take this medication  because it is contraindicated with her other medications. Per pts MD she is unable to take this medication because it is contraindicated with her other medications.  Other reaction(s): Other (See Comments), Unknown Other reaction(s): Other (See Comments), Unknown Per pts MD she is unable to take this medication because it is contraindicated with her other medications. Per pts MD she is unable to take this medication because it is contraindicated with her other medications. Other reaction(s): Other (See Comments), Unknown Other reaction(s): Other (See Comments), Unknown Per pts MD she is unable  to take this medication because it is contraindicated with her  Per pts MD she is unable to take this medication because it is contraindicated with her other medications.  . Sulfa Antibiotics Other (See Comments)    Per pts MD she is unable to take this medication because it is contraindicated with her other medications.  . Cefazolin Itching and Rash  . Cefprozil Rash  . Cephalosporins Itching and Rash  . Enbrel [Etanercept] Itching and Rash  . Humira [Adalimumab] Itching and Rash  . Hydrocodone-Acetaminophen Itching and Other (See Comments)    Itching only  . Lorabid [Loracarbef] Itching and Rash  . Meropenem Itching and Rash  . Oxycodone Itching and Other (See Comments)  . Oxycodone-Acetaminophen Itching  . Primidone Itching and Rash    Family History: Family History  Problem Relation Age of Onset  . Diabetes Mother   . Emphysema Mother   . Emphysema Father   . Colon cancer Maternal Grandfather   . Breast cancer Neg Hx   . Ovarian cancer Neg Hx     Social History:   reports that she has never smoked. She has never used smokeless tobacco. She reports that she does not drink alcohol or use drugs.  ROS: UROLOGY Frequent Urination?: Yes Hard to postpone urination?: No Burning/pain with urination?: No Get up at night to urinate?: Yes Leakage of urine?: No Urine stream  starts and stops?: No Trouble starting stream?: No Do you have to strain to urinate?: No Blood in urine?: No Urinary tract infection?: Yes Sexually transmitted disease?: No Injury to kidneys or bladder?: No Painful intercourse?: No Weak stream?: No Currently pregnant?: No Vaginal bleeding?: No Last menstrual period?: n  Gastrointestinal Nausea?: No Vomiting?: No Indigestion/heartburn?: No Diarrhea?: Yes Constipation?: No  Constitutional Fever: No Night sweats?: No Weight loss?: No Fatigue?: No  Skin Skin rash/lesions?: No Itching?: No  Eyes Blurred vision?: No Double vision?: No  Ears/Nose/Throat Sore throat?: No Sinus problems?: No  Hematologic/Lymphatic Swollen glands?: No Easy bruising?: No  Cardiovascular Leg swelling?: No Chest pain?: No  Respiratory Cough?: No Shortness of breath?: No  Endocrine Excessive thirst?: Yes  Musculoskeletal Back pain?: Yes Joint pain?: No  Neurological Headaches?: No Dizziness?: Yes  Psychologic Depression?: No Anxiety?: No  Physical Exam: BP 137/75   Pulse (!) 108   Ht 5' 1"  (1.549 m)   Wt 190 lb (86.2 kg)   BMI 35.90 kg/m   Constitutional:  Alert and oriented, no acute distress, nontoxic appearing HEENT: [place], AT Cardiovascular: No clubbing, cyanosis, or edema Respiratory: Normal respiratory effort, no increased work of breathing Skin: No rashes, bruises or suspicious lesions Neurologic: Grossly intact, no focal deficits, moving all 4 extremities Psychiatric: Normal mood and affect  Laboratory Data: Results for orders placed or performed in visit on 06/18/19  Urinalysis, Complete w Microscopic  Result Value Ref Range   Color, Urine AMBER (A) YELLOW   APPearance TURBID (A) CLEAR   Specific Gravity, Urine 1.020 1.005 - 1.030   pH 5.0 5.0 - 8.0   Glucose, UA NEGATIVE NEGATIVE mg/dL   Hgb urine dipstick NEGATIVE NEGATIVE   Bilirubin Urine NEGATIVE NEGATIVE   Ketones, ur NEGATIVE NEGATIVE mg/dL    Protein, ur 100 (A) NEGATIVE mg/dL   Nitrite POSITIVE (A) NEGATIVE   Leukocytes,Ua LARGE (A) NEGATIVE   RBC / HPF 11-20 0 - 5 RBC/hpf   WBC, UA >50 (H) 0 - 5 WBC/hpf   Bacteria, UA MANY (A) NONE SEEN   Squamous Epithelial / LPF  0-5 0 - 5   Mucus PRESENT    Hyaline Casts, UA PRESENT    Results for orders placed or performed in visit on 06/18/19  Urinalysis, Complete w Microscopic  Result Value Ref Range   Color, Urine AMBER (A) YELLOW   APPearance TURBID (A) CLEAR   Specific Gravity, Urine 1.020 1.005 - 1.030   pH 5.0 5.0 - 8.0   Glucose, UA NEGATIVE NEGATIVE mg/dL   Hgb urine dipstick NEGATIVE NEGATIVE   Bilirubin Urine NEGATIVE NEGATIVE   Ketones, ur NEGATIVE NEGATIVE mg/dL   Protein, ur 100 (A) NEGATIVE mg/dL   Nitrite POSITIVE (A) NEGATIVE   Leukocytes,Ua LARGE (A) NEGATIVE   RBC / HPF 11-20 0 - 5 RBC/hpf   WBC, UA >50 (H) 0 - 5 WBC/hpf   Bacteria, UA MANY (A) NONE SEEN   Squamous Epithelial / LPF 0-5 0 - 5   Mucus PRESENT    Hyaline Casts, UA PRESENT     Assessment & Plan:   1. Recurrent UTI 68 year old female with prior diagnosis recurrent UTI presents with 2 episodes of positive urinalysis over the past month.  Symptoms limited to chronic fatigue with PMH significant for IDA and RA.  ESR and CRP recently elevated.  Urinary symptoms limited to chronic urgency, unchanged in severity.  I strongly suspect patient is colonized with bacteria and being treated for asymptomatic bacteriuria.  I explained to her today that her chronic fatigue is most likely to be caused by her iron deficiency anemia and a rheumatoid arthritis flare given recent lab values.  I counseled her to follow-up with her rheumatologist for further evaluation of this.  I explained the treatment of urinary bacteria is not indicated in the absence of associated symptoms.  I counseled her to return to the clinic if she develops dysuria, worsened urgency, frequency, suprapubic pain, or gross hematuria for repeat  urine culture at that time.  I counseled her to not take another course of Macrobid if she is not symptomatic of urinary tract infection.  UA with pyuria today, however improved from the past 2 urinalyses conducted in the past month.  Will defer urine culture, as Dr. Grayland Ormond ordered this yesterday.  We will monitor this for results.  Of note, patient did have an isolated episode of microscopic hematuria per UA yesterday.  This may be associated with her known bilateral nonobstructing renal stones, however patient has never undergone hematuria work-up previously.  We will offer her cystoscopy and renal ultrasound pending urine culture results. - Urinalysis, Complete  2. Kidney stones Patient reports intermittent left sided back pain that travels medially and is exacerbated by bending, twisting, and lifting.  Based on this description and her known history of DDD, I suspect her symptoms are MSK in origin and not associated with her kidney stones.  I counseled her on this.  I recommended patient to maintain annual KUB surveillance for monitoring of her stone burden.  Return if symptoms worsen or fail to improve.  Debroah Loop, PA-C  [facility] [address] [place], [place] [address] [phone]

## 2019-06-20 LAB — URINALYSIS, COMPLETE
Bilirubin, UA: NEGATIVE
Glucose, UA: NEGATIVE
Ketones, UA: NEGATIVE
Nitrite, UA: NEGATIVE
Protein,UA: NEGATIVE
Specific Gravity, UA: 1.02 (ref 1.005–1.030)
Urobilinogen, Ur: 0.2 mg/dL (ref 0.2–1.0)
pH, UA: 6 (ref 5.0–7.5)

## 2019-06-20 LAB — MICROSCOPIC EXAMINATION
RBC, Urine: NONE SEEN /hpf (ref 0–2)
WBC, UA: >30 /hpf — AB (ref 0–5)

## 2019-06-20 LAB — URINE CULTURE: Culture: >=100000 — AB

## 2019-08-21 ENCOUNTER — Encounter: Payer: Self-pay | Admitting: Podiatry

## 2019-08-21 ENCOUNTER — Other Ambulatory Visit: Payer: Self-pay

## 2019-08-21 ENCOUNTER — Encounter: Payer: Self-pay | Admitting: Anesthesiology

## 2019-08-23 NOTE — Discharge Instructions (Signed)
[place] DR. TROXLER, DR. Vickki Muff, AND DR. Dougherty   1. Take your medication as prescribed.  Pain medication should be taken only as needed.  2. Keep the dressing clean, dry and intact.  3. Keep your foot elevated above the heart level for the first 48 hours.  4. Walking to the bathroom and brief periods of walking are acceptable, unless we have instructed you to be non-weight bearing.  5. Always wear your post-op shoe when walking.  Always use your crutches if you are to be non-weight bearing.  6. Do not take a shower. Baths are permissible as long as the foot is kept out of the water.   7. Every hour you are awake:  - Bend your knee 15 times. - Flex foot 15 times - Massage calf 15 times  8. Call [facility] [phone]) if any of the following problems occur: - You develop a temperature or fever. - The bandage becomes saturated with blood. - Medication does not stop your pain. - Injury of the foot occurs. - Any symptoms of infection including redness, odor, or red streaks running from wound.   General Anesthesia, Adult, Care After This sheet gives you information about how to care for yourself after your procedure. Your health care provider may also give you more specific instructions. If you have problems or questions, contact your health care provider. What can I expect after the procedure? After the procedure, the following side effects are common:  Pain or discomfort at the IV site.  Nausea.  Vomiting.  Sore throat.  Trouble concentrating.  Feeling cold or chills.  Weak or tired.  Sleepiness and fatigue.  Soreness and body aches. These side effects can affect parts of the body that were not involved in surgery. Follow these instructions at home:  For at least 24 hours after the procedure:  Have a responsible adult stay with you. It is  important to have someone help care for you until you are awake and alert.  Rest as needed.  Do not: ? Participate in activities in which you could fall or become injured. ? Drive. ? Use heavy machinery. ? Drink alcohol. ? Take sleeping pills or medicines that cause drowsiness. ? Make important decisions or sign legal documents. ? Take care of children on your own. Eating and drinking  Follow any instructions from your health care provider about eating or drinking restrictions.  When you feel hungry, start by eating small amounts of foods that are soft and easy to digest (bland), such as toast. Gradually return to your regular diet.  Drink enough fluid to keep your urine pale yellow.  If you vomit, rehydrate by drinking water, juice, or clear broth. General instructions  If you have sleep apnea, surgery and certain medicines can increase your risk for breathing problems. Follow instructions from your health care provider about wearing your sleep device: ? Anytime you are sleeping, including during daytime naps. ? While taking prescription pain medicines, sleeping medicines, or medicines that make you drowsy.  Return to your normal activities as told by your health care provider. Ask your health care provider what activities are safe for you.  Take over-the-counter and prescription medicines only as told by your health care provider.  If you smoke, do not smoke without supervision.  Keep all follow-up visits as told by your health care provider. This is important. Contact a health care provider if:  You have nausea or vomiting that does not get better with medicine.  You cannot eat or drink without vomiting.  You have pain that does not get better with medicine.  You are unable to pass urine.  You develop a skin rash.  You have a fever.  You have redness around your IV site that gets worse. Get help right away if:  You have difficulty breathing.  You have chest  pain.  You have blood in your urine or stool, or you vomit blood. Summary  After the procedure, it is common to have a sore throat or nausea. It is also common to feel tired.  Have a responsible adult stay with you for the first 24 hours after general anesthesia. It is important to have someone help care for you until you are awake and alert.  When you feel hungry, start by eating small amounts of foods that are soft and easy to digest (bland), such as toast. Gradually return to your regular diet.  Drink enough fluid to keep your urine pale yellow.  Return to your normal activities as told by your health care provider. Ask your health care provider what activities are safe for you. This information is not intended to replace advice given to you by your health care provider. Make sure you discuss any questions you have with your health care provider. Document Revised: 08/04/2017 Document Reviewed: 03/17/2017 Elsevier Patient Education  [place].

## 2019-08-27 ENCOUNTER — Other Ambulatory Visit: Payer: Medicare Other

## 2019-08-29 ENCOUNTER — Ambulatory Visit: Admission: RE | Admit: 2019-08-29 | Payer: Medicare Other | Source: Home / Self Care | Admitting: Podiatry

## 2019-08-29 HISTORY — DX: Other symptoms and signs involving the musculoskeletal system: R29.898

## 2019-08-29 HISTORY — DX: Dyspnea, unspecified: R06.00

## 2019-08-29 HISTORY — DX: Dizziness and giddiness: R42

## 2019-08-29 HISTORY — DX: Urinary tract infection, site not specified: N39.0

## 2019-08-29 HISTORY — DX: Presence of dental prosthetic device (complete) (partial): Z97.2

## 2019-08-29 SURGERY — OSTEOTOMY, WEIL
Anesthesia: Choice | Site: Toe | Laterality: Right

## 2019-09-11 ENCOUNTER — Other Ambulatory Visit: Payer: Self-pay | Admitting: Otolaryngology

## 2019-09-11 DIAGNOSIS — H9122 Sudden idiopathic hearing loss, left ear: Secondary | ICD-10-CM

## 2019-09-11 DIAGNOSIS — M6281 Muscle weakness (generalized): Secondary | ICD-10-CM

## 2019-09-12 ENCOUNTER — Ambulatory Visit: Payer: Medicare Other | Admitting: Occupational Therapy

## 2019-09-17 ENCOUNTER — Other Ambulatory Visit: Payer: Self-pay | Admitting: Otolaryngology

## 2019-09-17 DIAGNOSIS — H9122 Sudden idiopathic hearing loss, left ear: Secondary | ICD-10-CM

## 2019-09-17 DIAGNOSIS — M6281 Muscle weakness (generalized): Secondary | ICD-10-CM

## 2019-09-18 ENCOUNTER — Other Ambulatory Visit: Payer: Self-pay

## 2019-09-18 ENCOUNTER — Ambulatory Visit: Payer: Medicare Other | Attending: Rheumatology | Admitting: Occupational Therapy

## 2019-09-18 ENCOUNTER — Encounter: Payer: Self-pay | Admitting: Occupational Therapy

## 2019-09-18 DIAGNOSIS — M6281 Muscle weakness (generalized): Secondary | ICD-10-CM | POA: Diagnosis present

## 2019-09-18 DIAGNOSIS — R209 Unspecified disturbances of skin sensation: Secondary | ICD-10-CM | POA: Diagnosis not present

## 2019-09-18 DIAGNOSIS — R208 Other disturbances of skin sensation: Secondary | ICD-10-CM

## 2019-09-18 NOTE — Therapy (Signed)
[facility] [address] [place], [address] Phone: [phone]   Fax:  [phone]  Occupational Therapy Evaluation  Patient Details  Name: Nicole Bass MRN: [id number] Date of Birth: Sep 06, 1950 Referring Provider (OT): Dr Jefm Bryant   Encounter Date: 09/18/2019  OT End of Session - 09/18/19 1755    Visit Number  1    Number of Visits  4    Date for OT Re-Evaluation  10/30/19    OT Start Time  1440    OT Stop Time  1530    OT Time Calculation (min)  50 min    Activity Tolerance  Patient tolerated treatment well    Behavior During Therapy  [facility] for tasks assessed/performed       Past Medical History:  Diagnosis Date  . Anemia    iron everyday, 3 iron infusions last one August 2020  . Arthritis    rheumatoid  . Collagen vascular disease ([place])   . Crohn's disease ([place])   . Crohn's disease ([place])   . DDD (degenerative disc disease), cervical   . DDD (degenerative disc disease), cervical   . DDD (degenerative disc disease), cervical 2003  . DDD (degenerative disc disease), lumbar   . Degenerative disc disease, cervical   . Dyspnea    out of shape  . GERD (gastroesophageal reflux disease)   . Hand weakness    left hand worse, small tremors  . Hypertension     controlled on meds  . Neuromuscular disorder ([place])   . Neuropathy    legs  . UTI (urinary tract infection)    HO  . Vertigo    can be accompanied by nausea  . Wears dentures    upper dentures    Past Surgical History:  Procedure Laterality Date  . ABDOMINAL HYSTERECTOMY    . APPENDECTOMY    . BACK SURGERY     x 3  . CERVICAL SPINE SURGERY    . [place]  . CHOLECYSTECTOMY    . COLON SURGERY  1999   removed 18 inches of colon,removed appendix  . DILATION AND CURETTAGE OF UTERUS  2004  . ELBOW FRACTURE SURGERY  2008  . JOINT REPLACEMENT Right 2015   partial knee replacement  . KYPHOPLASTY    . KYPHOPLASTY  N/A 04/14/2015   Procedure: KYPHOPLASTY;  Surgeon: Hessie Knows, MD;  Location: [facility] ORS;  Service: Orthopedics;  Laterality: N/A;  . KYPHOPLASTY N/A 06/04/2015   Procedure: KYPHOPLASTY L-5;  Surgeon: Hessie Knows, MD;  Location: [facility] ORS;  Service: Orthopedics;  Laterality: N/A;  . MEDIAL PARTIAL KNEE REPLACEMENT    . OOPHORECTOMY Bilateral   . REPAIR EXTENSOR TENDON Left 01/08/2019   Procedure: Realignment  EXTENSOR TENDON;  Surgeon: Hessie Knows, MD;  Location: [facility] ORS;  Service: Orthopedics;  Laterality: Left;  . REVERSE SHOULDER ARTHROPLASTY Right 04/18/2019   Procedure: REVERSE SHOULDER ARTHROPLASTY;  Surgeon: Corky Mull, MD;  Location: [facility] ORS;  Service: Orthopedics;  Laterality: Right;  . SHOULDER ARTHROSCOPY WITH ROTATOR CUFF REPAIR AND SUBACROMIAL DECOMPRESSION Right 04/13/2016   Procedure: SHOULDER ARTHROSCOPY WITH ROTATOR CUFF REPAIR AND SUBACROMIAL DECOMPRESSION, release of long head biceps tendon;  Surgeon: Leanor Kail, MD;  Location: [facility] ORS;  Service: Orthopedics;  Laterality: Right;  . TOE SURGERY  2013  . TONSILLECTOMY    . WRIST FRACTURE SURGERY Bilateral     There were no vitals filed for this visit.  Subjective Assessment -  09/18/19 1742    Subjective   My numbness in my fingers started after my hand went into spasm January 10th - and then Dr Raliegh Ip gave me shot in wrist and work my fingers out - thumb is better - the pain and numbness - but index, middle and ring finger tips    Pertinent History  Pt has history of RA, surgery to 2nd and 3rd digits last year, sensory neuropathy in feet - pt was in ER 08/25/2019  with had in fist and could not open it - since then had numbness in thumb thru 4th digit- but report thumb pain and numbness better - did had shot on 08/26/2019 from Dr Raliegh Ip - nerve conduction test yesterday per pt was negative for CT per Dr Melrose Nakayama    Patient Stated Goals  I want to numbness better- so I can use my hand - and the tremor better    Currently in Pain?   No/denies        [facility] OT Assessment - 09/18/19 0001      Assessment   Medical Diagnosis  L hand pain/numbness     Referring Provider (OT)  Dr Jefm Bryant    Onset Date/Surgical Date  08/25/19    Hand Dominance  Right    Next MD Visit  --   tomorrow   Prior Therapy  --   2018     Precautions   Required Braces or Orthoses  --   fitted with wrist splint     Home  Environment   Lives With  Spouse      Prior Function   Vocation  Retired    Leisure  read, Oceanographer, own house work       Strength   Right Hand Grip (lbs)  45    Right Hand Lateral Pinch  11 lbs    Right Hand 3 Point Pinch  7 lbs    Left Hand Grip (lbs)  30    Left Hand Lateral Pinch  4 lbs    Left Hand 3 Point Pinch  5 lbs      Left Hand AROM   L Index  MCP 0-90  85 Degrees    L Index PIP 0-100  85 Degrees    L Long  MCP 0-90  90 Degrees    L Long PIP 0-100  100 Degrees    L Ring  MCP 0-90  90 Degrees    L Ring PIP 0-100  100 Degrees    L Little  MCP 0-90  90 Degrees    L Little PIP 0-100  100 Degrees         contrast done and HEP review with pt   Contrast 2 x day  Wear wrist splint as much as she can - except ADL's , HEP  Tendon glides  MEd N glides   Avoid tight and sustained grip             OT Education - 09/18/19 1754    Education provided  Yes    Education Details  findings of eval and HEP    Person(s) Educated  Patient    Methods  Explanation;Demonstration;Tactile cues;Verbal cues;Handout    Comprehension  Verbal cues required;Returned demonstration;Verbalized understanding       OT Short Term Goals - 11/16/16 1416      OT SHORT TERM GOAL #1   Title  Pt to be ind in HEP to wear oval 8 splint, increase ROM , increase  use of 3 point pinch in ADL's and decrease triggering     Status  Achieved        OT Long Term Goals - 09/18/19 1801      OT LONG TERM GOAL #1   Title  Pt report decrease numbness in 2nd thru 4th digits to do buttons , cutting food better    Baseline   trouble with small objects , numbness 2nd to 4th tips    Time  4    Period  Weeks    Status  New    Target Date  10/16/19      OT LONG TERM GOAL #2   Title  Pt show increase 3 point pinch with 3 lbs to cut with knife and fork    Baseline  3 point pinch L 4 lbs , R 11 lbs - cannot hold fork to cut    Time  6    Period  Weeks    Status  New    Target Date  10/30/19            Plan - 09/18/19 1756    Clinical Impression Statement  Pt present at OT eval with numbness in 2nd thru 4th digit tip since 08/25/2019 when she went to the ER with hand in spasm/fist - and could not open it - pt had nerve conduction test yesterday - per pt it did not show any nerve involvement distally of elbow - pt did had positive Tinel over CT and [place] - appear that the last week her thumb pain and numbess per pt did improve -appear pt had flareup of RA at that time also in hand per pt - pt to do contrast and wear wrist splint for 2 wks and provide her with some tendon glide and Med N glides - pt did not had symptoms changes with cervical AROM - pt to see Dr Jefm Bryant tomorrow - will follow up with pt in 2 wks    OT Occupational Profile and History  Problem Focused Assessment - Including review of records relating to presenting problem    Occupational performance deficits (Please refer to evaluation for details):  ADL's;IADL's;Rest and Sleep;Play;Leisure    Body Structure / Function / Physical Skills  ADL;UE functional use;FMC;Strength;Sensation    Rehab Potential  Fair    Clinical Decision Making  Limited treatment options, no task modification necessary    Comorbidities Affecting Occupational Performance:  May have comorbidities impacting occupational performance   pt has sensory neuropathy in feet, had hand surgery last year   Modification or Assistance to Complete Evaluation   No modification of tasks or assist necessary to complete eval    OT Frequency  Biweekly    OT Duration  6 weeks    OT  Treatment/Interventions  Self-care/ADL training;Patient/family education;Splinting;Contrast Bath;Manual Therapy;Therapeutic exercise    Plan  assess progress with HEP    OT Home Exercise Plan  see pt instruction    Consulted and Agree with Plan of Care  Patient       Patient will benefit from skilled therapeutic intervention in order to improve the following deficits and impairments:   Body Structure / Function / Physical Skills: ADL, UE functional use, FMC, Strength, Sensation       Visit Diagnosis: Other disturbances of skin sensation - Plan: Ot plan of care cert/re-cert  Muscle weakness (generalized) - Plan: Ot plan of care cert/re-cert    Problem List Patient Active Problem List   Diagnosis Date Noted  .  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 Rotator cuff arthropathy, right 01/25/2019  . Iron deficiency anemia 09/13/2018  . Anemia, unspecified 09/09/2018  . Acute kidney injury ([place]) 08/23/2018  . HTN, goal below 140/80 06/04/2018  . Hyperthyroidism 02/08/2018  . Stomatitis, ulcerative 09/06/2017  . Dyspareunia in female 08/22/2017  . Vaginal atrophy 08/22/2017  . Obesity (BMI 30.0-34.9) 08/22/2017  . Leg pain, left 04/05/2017  . Peripheral polyneuropathy 04/05/2017  . Lumbar radiculopathy 04/05/2017  . Tremor 02/13/2017  . Sensory neuropathy 02/13/2017  . Restless leg syndrome 02/13/2017  . Acute diarrhea 09/06/2016  . History of pyelonephritis 09/06/2016  . Hypokalemia 09/06/2016  . 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 Pyelonephritis 08/29/2016  . Rheumatoid arthritis, unspecified ([place]) 08/29/2016  . Nontraumatic complete tear of right rotator cuff 03/08/2016  . Acute pain of right shoulder 01/27/2016  . Type 2 diabetes mellitus with diabetic neuropathy ([place]) 10/27/2015  . Crohn's colitis ([place]) 09/04/2015  . Acute bilateral low back pain without sciatica 03/23/2015  . SOB (shortness of breath) on exertion 01/15/2015   . Pedal edema 01/15/2015  . Status post open reduction with internal fixation of fracture 11/27/2014  . Painful rib 09/11/2014  . Osteoporosis 03/17/2014  . Depression 03/17/2014  . B12 deficiency 03/17/2014  . Crohn's disease, unspecified, without complications ([place]) 77/82/4235  . Urolith 07/16/2013  . Right flank pain 07/16/2013  . Abdominal pain 07/16/2013    Rosalyn Gess OTR/L,CLT 09/18/2019, 6:04 PM  [facility] [address], [place], [address] Phone: [phone]   Fax:  [phone]  Name: Nicole Bass MRN: [id number] Date of Birth: 29-Jun-1951

## 2019-09-18 NOTE — Patient Instructions (Signed)
Contrast 2 x day  Wear wrist splint as much as she can - except ADL's , HEP  Tendon glides  MEd N glides   Avoid tight and sustained grip

## 2019-09-20 NOTE — Progress Notes (Signed)
[facility]  [phone] [phone]  ID: Nicole Bass OB: 28-Aug-1950  MR#: [id number]  EYC#:[id number]  Patient Care Team: [name], MD as PCP - General (Internal Medicine)  CHIEF COMPLAINT: Iron deficiency anemia.  INTERVAL HISTORY: Patient returns to clinic today for repeat laboratory work, further evaluation, and consideration of additional IV Feraheme.  She has multiple medical complaints including increased weakness and fatigue.  She is tearful today complaining of hand, foot, and shoulder pain.  She reports no recent Crohn's flares.  She is anxious and complains of insomnia.  She has no neurologic complaints.  She denies any recent fevers or illnesses.  She has good appetite and denies weight loss.  She denies any chest pain, shortness of breath, cough, or hemoptysis.  She denies any nausea, vomiting, constipation, or diarrhea.  She has no melena or hematochezia.  She has no urinary complaints.  Patient offers no further specific complaints today.  REVIEW OF SYSTEMS:   Review of Systems  Constitutional: Positive for malaise/fatigue. Negative for fever and weight loss.  Respiratory: Negative.  Negative for cough, hemoptysis and shortness of breath.   Cardiovascular: Negative.  Negative for chest pain and leg swelling.  Gastrointestinal: Negative.  Negative for abdominal pain, blood in stool, nausea and vomiting.  Genitourinary: Negative.  Negative for frequency and hematuria.  Musculoskeletal: Positive for joint pain. Negative for back pain.  Skin: Negative.  Negative for rash.  Neurological: Positive for weakness. Negative for dizziness, focal weakness and headaches.  Psychiatric/Behavioral: The patient is nervous/anxious and has insomnia.     As per HPI. Otherwise, a complete review of systems is negative.  PAST MEDICAL HISTORY: Past Medical History:  Diagnosis Date  . Anemia    iron everyday, 3 iron infusions last one August  2020  . Arthritis    rheumatoid  . Collagen vascular disease ([place])   . Crohn's disease ([place])   . Crohn's disease ([place])   . DDD (degenerative disc disease), cervical   . DDD (degenerative disc disease), cervical   . DDD (degenerative disc disease), cervical 2003  . DDD (degenerative disc disease), lumbar   . Degenerative disc disease, cervical   . Dyspnea    out of shape  . GERD (gastroesophageal reflux disease)   . Hand weakness    left hand worse, small tremors  . Hypertension     controlled on meds  . Neuromuscular disorder ([place])   . Neuropathy    legs  . UTI (urinary tract infection)    HO  . Vertigo    can be accompanied by nausea  . Wears dentures    upper dentures    PAST SURGICAL HISTORY: Past Surgical History:  Procedure Laterality Date  . ABDOMINAL HYSTERECTOMY    . APPENDECTOMY    . BACK SURGERY     x 3  . CERVICAL SPINE SURGERY    . [place]  . CHOLECYSTECTOMY    . COLON SURGERY  1999   removed 18 inches of colon,removed appendix  . DILATION AND CURETTAGE OF UTERUS  2004  . ELBOW FRACTURE SURGERY  2008  . JOINT REPLACEMENT Right 2015   partial knee replacement  . KYPHOPLASTY    . KYPHOPLASTY N/A 04/14/2015   Procedure: KYPHOPLASTY;  Surgeon: Hessie Knows, MD;  Location: [facility] ORS;  Service: Orthopedics;  Laterality: N/A;  . KYPHOPLASTY N/A 06/04/2015   Procedure: KYPHOPLASTY L-5;  Surgeon: Hessie Knows, MD;  Location: [facility] ORS;  Service: Orthopedics;  Laterality: N/A;  . MEDIAL PARTIAL KNEE REPLACEMENT    . OOPHORECTOMY Bilateral   . REPAIR EXTENSOR TENDON Left 01/08/2019   Procedure: Realignment  EXTENSOR TENDON;  Surgeon: Hessie Knows, MD;  Location: [facility] OR[facility];  Service: Orthopedics;  Laterality: Left;  . REVERSE SHOULDER ARTHROPLASTY Right 04/18/2019   Procedure: REVERSE SHOULDER ARTHROPLASTY;  Surgeon: Corky Mull, MD;  Location: [facility] OR[facility];  Service: Orthopedics;  Laterality: Right;  . SHOULDER ARTHROSCOPY WITH ROTATOR CUFF  REPAIR AND SUBACROMIAL DECOMPRESSION Right 04/13/2016   Procedure: SHOULDER ARTHROSCOPY WITH ROTATOR CUFF REPAIR AND SUBACROMIAL DECOMPRESSION, release of long head biceps tendon;  Surgeon: Leanor Kail, MD;  Location: [facility] ORS;  Service: Orthopedics;  Laterality: Right;  . TOE SURGERY  2013  . TONSILLECTOMY    . WRIST FRACTURE SURGERY Bilateral     FAMILY HISTORY: Family History  Problem Relation Age of Onset  . Diabetes Mother   . Emphysema Mother   . Emphysema Father   . Colon cancer Maternal Grandfather   . Breast cancer Neg Hx   . Ovarian cancer Neg Hx     ADVANCED DIRECTIVES (Y/N):  N  HEALTH MAINTENANCE: Social History   Tobacco Use  . Smoking status: Never Smoker  . Smokeless tobacco: Never Used  Substance Use Topics  . Alcohol use: No  . Drug use: No     Colonoscopy:  PAP:  Bone density:  Lipid panel:  Allergies  Allergen Reactions  . Ciprofloxacin Itching and Rash  . Levofloxacin Hives and Rash  . Methotrexate Derivatives     Mouth and throat ulcers  . Other Other (See Comments) and Rash    Other reaction(s): Other (See Comments), Unknown Per pts MD she is unable to take this medication because it is contraindicated with her other medications. Per pts MD she is unable to take this medication because it is contraindicated with her other medications.  Other reaction(s): Other (See Comments), Unknown Other reaction(s): Other (See Comments), Unknown Per pts MD she is unable to take this medication because it is contraindicated with her other medications. Per pts MD she is unable to take this medication because it is contraindicated with her other medications. Other reaction(s): Other (See Comments), Unknown Other reaction(s): Other (See Comments), Unknown Per pts MD she is unable to take this medication because it is contraindicated with her  Per pts MD she is unable to take this medication because it is contraindicated with her other medications.   . Sulfa Antibiotics Other (See Comments)    Per pts MD she is unable to take this medication because it is contraindicated with her other medications.  . Cefazolin Itching and Rash  . Cefprozil Rash  . Cephalosporins Itching and Rash  . Enbrel [Etanercept] Itching and Rash  . Humira [Adalimumab] Itching and Rash  . Hydrocodone-Acetaminophen Itching and Other (See Comments)    Itching only  . Lorabid [Loracarbef] Itching and Rash  . Meropenem Itching and Rash  . Oxycodone Itching and Other (See Comments)  . Oxycodone-Acetaminophen Itching  . Primidone Itching and Rash    Current Outpatient Medications  Medication Sig Dispense Refill  . acetaminophen (TYLENOL 8 HOUR ARTHRITIS PAIN) 650 MG CR tablet Take 1,300 mg by mouth every 8 (eight) hours as needed for pain.    Marland Kitchen alendronate (FOSAMAX) 70 MG tablet Take 70 mg by mouth every Monday. Take with a full glass of water on an empty stomach.     Marland Kitchen amitriptyline (ELAVIL) 10 MG tablet  Take 10 mg by mouth at bedtime.    Marland Kitchen aspirin EC 81 MG tablet Take 81 mg by mouth daily.    Marland Kitchen b complex vitamins tablet Take 1 tablet by mouth daily.    . cetirizine (ZYRTEC) 10 MG tablet Take 10 mg by mouth daily.    . cholecalciferol (VITAMIN D3) 25 MCG (1000 UT) tablet Take 1,000 Units by mouth daily.    . cyanocobalamin (,VITAMIN B-12,) 1000 MCG/ML injection Inject 1,000 mcg into the muscle every 30 (thirty) days.    . diazepam (VALIUM) 10 MG tablet Take 1 tablet (10 mg total) by mouth daily as needed (vaginal pain with intercourse, use before sexual activity per vagina). 30 tablet 0  . diphenoxylate-atropine (LOMOTIL) 2.5-0.025 MG per tablet Take 1 tablet by mouth daily. am    . ferrous sulfate 325 (65 FE) MG tablet Take 1 tablet (325 mg total) by mouth 2 (two) times daily with a meal. (Patient taking differently: Take 325 mg by mouth at bedtime. ) 60 tablet 0  . fluticasone (FLONASE) 50 MCG/ACT nasal spray SMARTSIG:2 Spray(s) Both Nares Every Night    .  furosemide (LASIX) 20 MG tablet Take 20 mg by mouth daily. am    . gabapentin (NEURONTIN) 100 MG capsule Take 100 mg in the morning    . gabapentin (NEURONTIN) 400 MG capsule Take 400 mg by mouth at bedtime. 100 mg every morning also    . ketorolac (TORADOL) 10 MG tablet Take 1 tablet (10 mg total) by mouth every 6 (six) hours as needed. 30 tablet 0  . magnesium oxide (MAG-OX) 400 MG tablet Take 400 mg by mouth daily.     . methimazole (TAPAZOLE) 5 MG tablet Take 2.5 mg by mouth every Monday, Wednesday, and Friday.     . Multiple Vitamin (MULTIVITAMIN WITH MINERALS) TABS tablet Take 1 tablet by mouth daily.    Marland Kitchen omeprazole (PRILOSEC) 40 MG capsule Take 40 mg by mouth daily before breakfast.     . potassium chloride SA (K-DUR,KLOR-CON) 20 MEQ tablet Take 20 mEq by mouth daily.     . predniSONE (DELTASONE) 1 MG tablet Take 4 mg by mouth daily with breakfast.    . Probiotic Product (PROBIOTIC PO) Take 1 capsule by mouth daily.     Marland Kitchen rOPINIRole (REQUIP) 1 MG tablet Take 1 mg by mouth at bedtime as needed (for restless legs).     Marland Kitchen telmisartan (MICARDIS) 40 MG tablet Take by mouth.    . Tofacitinib Citrate (XELJANZ) 5 MG TABS Take 5 mg by mouth. Take 2 daily    . traMADol (ULTRAM) 50 MG tablet Take 100 mg by mouth 3 (three) times daily as needed for moderate pain.     . naproxen (NAPROSYN) 500 MG tablet Take 500 mg by mouth daily.     . nitrofurantoin, macrocrystal-monohydrate, (MACROBID) 100 MG capsule Take 1 capsule (100 mg total) by mouth 2 (two) times daily. (Patient not taking: Reported on 08/21/2019) 14 capsule 0  . sucralfate (CARAFATE) 1 g tablet Take 0.5 g by mouth 3 (three) times daily with meals as needed (for stomach cramping (crohn's disease)).      No current facility-administered medications for this visit.    OBJECTIVE: Vitals:   09/24/19 1353  BP: 135/72  Pulse: 94  Resp: 18  Temp: 98 F (36.7 C)  SpO2: 100%     Body mass index is 36.09 kg/m.    ECOG FS:0 - Asymptomatic   General: Well-developed, well-nourished, no  acute distress. Eyes: Pink conjunctiva, anicteric sclera. HEENT: Normocephalic, moist mucous membranes. Lungs: No audible wheezing or coughing. Heart: Regular rate and rhythm. Abdomen: Soft, nontender, no obvious distention. Musculoskeletal: No edema, cyanosis, or clubbing. Neuro: Alert, answering all questions appropriately. Cranial nerves grossly intact. Skin: No rashes or petechiae noted. Psych: Normal affect.   LAB RESULTS:  Lab Results  Component Value Date   NA 141 04/19/2019   K 3.9 04/19/2019   CL 112 (H) 04/19/2019   CO2 22 04/19/2019   GLUCOSE 194 (H) 04/19/2019   BUN 23 04/19/2019   CREATININE 1.06 (H) 04/19/2019   CALCIUM 8.1 (L) 04/19/2019   PROT 6.6 08/23/2018   ALBUMIN 3.4 (L) 08/23/2018   AST 27 08/23/2018   ALT 20 08/23/2018   ALKPHOS 58 08/23/2018   BILITOT 0.6 08/23/2018   GFRNONAA 55 (L) 04/19/2019   GFRAA >60 04/19/2019    Lab Results  Component Value Date   WBC 7.9 09/24/2019   NEUTROABS 5.5 09/24/2019   HGB 11.6 (L) 09/24/2019   HCT 36.1 09/24/2019   MCV 100.8 (H) 09/24/2019   PLT 245 09/24/2019   Lab Results  Component Value Date   IRON 103 09/24/2019   TIBC 321 09/24/2019   IRONPCTSAT 32 (H) 09/24/2019   Lab Results  Component Value Date   FERRITIN 1,302 (H) 09/24/2019     STUDIES: No results found.  ASSESSMENT: Iron deficiency anemia.  PLAN:   1.  Iron deficiency anemia: Nearly resolved.  Patient's hemoglobin has increased to 11.6 and her iron stores are within normal limits.  She does not require additional IV iron today.  Patient last received 510 mg IV Feraheme on June 18, 2019.  Return to clinic in 3 months with repeat laboratory work, further evaluation, and consideration of additional treatment.   2.  Crohn's disease: Patient reports no recent flares.  Continue follow-up and treatment with GI. 3.  Elevated ferritin: Likely secondary to acute phase reactant.  Monitor. 4.   Shoulder, hand, foot pain: Continue follow-up as indicated. 5.  Anxiety/insomnia: Patient has been instructed to follow-up with her primary care physician for evaluation and treatment.  I spent a total of 30 minutes reviewing chart data, face-to-face evaluation with the patient, counseling and coordination of care as detailed above.  Patient expressed understanding and was in agreement with this plan. She also understands that She can call clinic at any time with any questions, concerns, or complaints.    Lloyd Huger, MD   09/26/2019 6:47 AM

## 2019-09-23 NOTE — Progress Notes (Signed)
Called patient no answer unable to leave message

## 2019-09-24 ENCOUNTER — Encounter: Payer: Self-pay | Admitting: Oncology

## 2019-09-24 ENCOUNTER — Other Ambulatory Visit: Payer: Self-pay

## 2019-09-24 ENCOUNTER — Inpatient Hospital Stay: Payer: Medicare Other | Attending: Oncology

## 2019-09-24 ENCOUNTER — Inpatient Hospital Stay: Payer: Medicare Other

## 2019-09-24 ENCOUNTER — Inpatient Hospital Stay (HOSPITAL_BASED_OUTPATIENT_CLINIC_OR_DEPARTMENT_OTHER): Payer: Medicare Other | Admitting: Oncology

## 2019-09-24 VITALS — BP 135/72 | HR 94 | Temp 98.0°F | Resp 18 | Wt 191.0 lb

## 2019-09-24 DIAGNOSIS — K5090 Crohn's disease, unspecified, without complications: Secondary | ICD-10-CM | POA: Diagnosis not present

## 2019-09-24 DIAGNOSIS — R7989 Other specified abnormal findings of blood chemistry: Secondary | ICD-10-CM | POA: Diagnosis not present

## 2019-09-24 DIAGNOSIS — M79673 Pain in unspecified foot: Secondary | ICD-10-CM | POA: Diagnosis not present

## 2019-09-24 DIAGNOSIS — D509 Iron deficiency anemia, unspecified: Secondary | ICD-10-CM | POA: Insufficient documentation

## 2019-09-24 DIAGNOSIS — G4700 Insomnia, unspecified: Secondary | ICD-10-CM | POA: Insufficient documentation

## 2019-09-24 DIAGNOSIS — F419 Anxiety disorder, unspecified: Secondary | ICD-10-CM | POA: Diagnosis not present

## 2019-09-24 DIAGNOSIS — D649 Anemia, unspecified: Secondary | ICD-10-CM

## 2019-09-24 LAB — IRON AND TIBC
Iron: 103 ug/dL (ref 28–170)
Saturation Ratios: 32 % — ABNORMAL HIGH (ref 10.4–31.8)
TIBC: 321 ug/dL (ref 250–450)
UIBC: 218 ug/dL

## 2019-09-24 LAB — CBC WITH DIFFERENTIAL/PLATELET
Abs Immature Granulocytes: 0.08 K/uL — ABNORMAL HIGH (ref 0.00–0.07)
Basophils Absolute: 0 K/uL (ref 0.0–0.1)
Basophils Relative: 0 %
Eosinophils Absolute: 0.1 K/uL (ref 0.0–0.5)
Eosinophils Relative: 1 %
HCT: 36.1 % (ref 36.0–46.0)
Hemoglobin: 11.6 g/dL — ABNORMAL LOW (ref 12.0–15.0)
Immature Granulocytes: 1 %
Lymphocytes Relative: 16 %
Lymphs Abs: 1.3 K/uL (ref 0.7–4.0)
MCH: 32.4 pg (ref 26.0–34.0)
MCHC: 32.1 g/dL (ref 30.0–36.0)
MCV: 100.8 fL — ABNORMAL HIGH (ref 80.0–100.0)
Monocytes Absolute: 0.9 K/uL (ref 0.1–1.0)
Monocytes Relative: 12 %
Neutro Abs: 5.5 K/uL (ref 1.7–7.7)
Neutrophils Relative %: 70 %
Platelets: 245 K/uL (ref 150–400)
RBC: 3.58 MIL/uL — ABNORMAL LOW (ref 3.87–5.11)
RDW: 12.5 % (ref 11.5–15.5)
WBC: 7.9 K/uL (ref 4.0–10.5)
nRBC: 0 % (ref 0.0–0.2)

## 2019-09-24 LAB — FERRITIN: Ferritin: 1302 ng/mL — ABNORMAL HIGH (ref 11–307)

## 2019-09-24 NOTE — Progress Notes (Signed)
Pt in for follow up, very tearful states "I feel like my body is falling apart".

## 2019-09-25 ENCOUNTER — Ambulatory Visit: Payer: Medicare Other

## 2019-10-02 ENCOUNTER — Ambulatory Visit: Payer: Medicare Other | Admitting: Occupational Therapy

## 2019-10-02 ENCOUNTER — Other Ambulatory Visit: Payer: Self-pay

## 2019-10-02 DIAGNOSIS — R209 Unspecified disturbances of skin sensation: Secondary | ICD-10-CM | POA: Diagnosis not present

## 2019-10-02 DIAGNOSIS — R208 Other disturbances of skin sensation: Secondary | ICD-10-CM

## 2019-10-02 DIAGNOSIS — M6281 Muscle weakness (generalized): Secondary | ICD-10-CM

## 2019-10-02 NOTE — Patient Instructions (Signed)
Add wrist and fingers composite extention stretch prior to AROM HEP -

## 2019-10-02 NOTE — Therapy (Signed)
[facility] [address], [place], [address] Phone: [phone]   Fax:  [phone]  Occupational Therapy Treatment  PatientPt  Details  Name: Nicole Bass MRN: [id number] Date of Birth: 15-Sep-1950 Referring Provider (OT): Dr Jefm Bryant   Encounter Date: 10/02/2019  OT End of Session - 10/02/19 1516    Visit Number  2    Number of Visits  4    Date for OT Re-Evaluation  10/30/19    OT Start Time  1455    OT Stop Time  1540    OT Time Calculation (min)  45 min    Activity Tolerance  Patient tolerated treatment well    Behavior During Therapy  [facility] for tasks assessed/performed       Past Medical History:  Diagnosis Date  . Anemia    iron everyday, 3 iron infusions last one August 2020  . Arthritis    rheumatoid  . Collagen vascular disease ([place])   . Crohn's disease ([place])   . Crohn's disease ([place])   . DDD (degenerative disc disease), cervical   . DDD (degenerative disc disease), cervical   . DDD (degenerative disc disease), cervical 2003  . DDD (degenerative disc disease), lumbar   . Degenerative disc disease, cervical   . Dyspnea    out of shape  . GERD (gastroesophageal reflux disease)   . Hand weakness    left hand worse, small tremors  . Hypertension     controlled on meds  . Neuromuscular disorder ([place])   . Neuropathy    legs  . UTI (urinary tract infection)    HO  . Vertigo    can be accompanied by nausea  . Wears dentures    upper dentures    Past Surgical History:  Procedure Laterality Date  . ABDOMINAL HYSTERECTOMY    . APPENDECTOMY    . BACK SURGERY     x 3  . CERVICAL SPINE SURGERY    . [place]  . CHOLECYSTECTOMY    . COLON SURGERY  1999   removed 18 inches of colon,removed appendix  . DILATION AND CURETTAGE OF UTERUS  2004  . ELBOW FRACTURE SURGERY  2008  . JOINT REPLACEMENT Right 2015   partial knee replacement  . KYPHOPLASTY    .  KYPHOPLASTY N/A 04/14/2015   Procedure: KYPHOPLASTY;  Surgeon: Hessie Knows, MD;  Location: [facility];  Service: Orthopedics;  Laterality: N/A;  . KYPHOPLASTY N/A 06/04/2015   Procedure: KYPHOPLASTY L-5;  Surgeon: Hessie Knows, MD;  Location: [facility];  Service: Orthopedics;  Laterality: N/A;  . MEDIAL PARTIAL KNEE REPLACEMENT    . OOPHORECTOMY Bilateral   . REPAIR EXTENSOR TENDON Left 01/08/2019   Procedure: Realignment  EXTENSOR TENDON;  Surgeon: Hessie Knows, MD;  Location: [facility];  Service: Orthopedics;  Laterality: Left;  . REVERSE SHOULDER ARTHROPLASTY Right 04/18/2019   Procedure: REVERSE SHOULDER ARTHROPLASTY;  Surgeon: Corky Mull, MD;  Location: [facility];  Service: Orthopedics;  Laterality: Right;  . SHOULDER ARTHROSCOPY WITH ROTATOR CUFF REPAIR AND SUBACROMIAL DECOMPRESSION Right 04/13/2016   Procedure: SHOULDER ARTHROSCOPY WITH ROTATOR CUFF REPAIR AND SUBACROMIAL DECOMPRESSION, release of long head biceps tendon;  Surgeon: Leanor Kail, MD;  Location: [facility];  Service: Orthopedics;  Laterality: Right;  . TOE SURGERY  2013  . TONSILLECTOMY    . WRIST FRACTURE SURGERY Bilateral     There were no vitals filed for this visit.  Subjective  Assessment - 10/02/19 1457    Subjective   My finger tips still numb - seeing Dr Jefm Bryant tomorrow, and MRI next week - no pain - thumb numbness is better- done the exercises and wearing the splint    Pertinent History  Pt has history of RA, surgery to 2nd and 3rd digits last year, sensory neuropathy in feet - pt was in ER 08/25/2019  with had in fist and could not open it - since then had numbness in thumb thru 4th digit- but report thumb pain and numbness better - did had shot on 08/26/2019 from Dr Raliegh Ip - nerve conduction test yesterday per pt was negative for CT per Dr Melrose Nakayama    Patient Stated Goals  I want to numbness better- so I can use my hand - and the tremor better    Currently in Pain?  No/denies         [facility] OT Assessment - 10/02/19  0001      Strength   Right Hand Grip (lbs)  45    Right Hand Lateral Pinch  11 lbs    Right Hand 3 Point Pinch  9 lbs    Left Hand Grip (lbs)  30    Left Hand Lateral Pinch  6 lbs    Left Hand 3 Point Pinch  7 lbs       Pt show increase lat grip but sensation still the same - pins and needles in 2nd thru 4th - with 3rd worse and 3.61 on Semmes Weinstein Soft tissue mobs done to hand and CT as well as digits  Wrist extention PROM stretch - tightness in forearm flexors into 3rd digit and thumb  Attempted graston tool nr 2 on volar forearm but to tender over forearm flexors Would recommend dryneelding - ask verbal order for Dr Jefm Bryant         OT Treatments/Exercises (OP) - 10/02/19 0001      LUE Contrast Bath   Time  9 minutes    Comments  prior to soft tissue and ROM         Wear wrist splint as much as she can - except ADL's , HEP at home  And done and ad prayer stretch for pt for composite extention for wrist and digits  Tendon glides  MEd N glides 5 reps   Avoid tight and sustained grip        OT Education - 10/02/19 1516    Education provided  Yes    Education Details  HEP review    Person(s) Educated  Patient    Methods  Explanation;Demonstration;Tactile cues;Verbal cues;Handout    Comprehension  Verbal cues required;Returned demonstration;Verbalized understanding       OT Short Term Goals - 11/16/16 1416      OT SHORT TERM GOAL #1   Title  Pt to be ind in HEP to wear oval 8 splint, increase ROM , increase use of 3 point pinch in ADL's and decrease triggering     Status  Achieved        OT Long Term Goals - 09/18/19 1801      OT LONG TERM GOAL #1   Title  Pt report decrease numbness in 2nd thru 4th digits to do buttons , cutting food better    Baseline  trouble with small objects , numbness 2nd to 4th tips    Time  4    Period  Weeks    Status  New    Target  Date  10/16/19      OT LONG TERM GOAL #2   Title  Pt show increase 3 point pinch  with 3 lbs to cut with knife and fork    Baseline  3 point pinch L 4 lbs , R 11 lbs - cannot hold fork to cut    Time  6    Period  Weeks    Status  New    Target Date  10/30/19            Plan - 10/02/19 1517    Clinical Impression Statement  Pt cont to show some pins and needles in tips of 2nd thru 4th - with 3rd the worse - this date did Semmes Weinstin normal sensation on 2nd and 4th -but 3rd 3.61 for diminished light touch - pt this date with tightenss and trigger points over flexors of forearm - unable to tolerate soft tissue - would recommend dry needling - pt did not had  Tinel at CT but phalans Positive - pt to see Dr Jefm Bryant tomorrow - MRI of brain and ear next week    OT Occupational Profile and History  Problem Focused Assessment - Including review of records relating to presenting problem    Occupational performance deficits (Please refer to evaluation for details):  ADL's;IADL's;Rest and Sleep;Play;Leisure    Body Structure / Function / Physical Skills  ADL;UE functional use;FMC;Strength;Sensation    Rehab Potential  Fair    Clinical Decision Making  Limited treatment options, no task modification necessary    Comorbidities Affecting Occupational Performance:  May have comorbidities impacting occupational performance    Modification or Assistance to Complete Evaluation   No modification of tasks or assist necessary to complete eval    OT Frequency  Biweekly    OT Duration  4 weeks    OT Treatment/Interventions  Self-care/ADL training;Patient/family education;Splinting;Contrast Bath;Manual Therapy;Therapeutic exercise    Plan  assess progress with HEP - dryneeling by PT on flexors if verbal order from Dr Jefm Bryant    OT Home Exercise Plan  see pt instruction    Consulted and Agree with Plan of Care  Patient       Patient will benefit from skilled therapeutic intervention in order to improve the following deficits and impairments:   Body Structure / Function / Physical  Skills: ADL, UE functional use, FMC, Strength, Sensation       Visit Diagnosis: Other disturbances of skin sensation  Muscle weakness (generalized)    Problem List Patient Active Problem List   Diagnosis Date Noted  . 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 Rotator cuff arthropathy, right 01/25/2019  . Iron deficiency anemia 09/13/2018  . Anemia, unspecified 09/09/2018  . Acute kidney injury ([place]) 08/23/2018  . HTN, goal below 140/80 06/04/2018  . Hyperthyroidism 02/08/2018  . Stomatitis, ulcerative 09/06/2017  . Dyspareunia in female 08/22/2017  . Vaginal atrophy 08/22/2017  . Obesity (BMI 30.0-34.9) 08/22/2017  . Leg pain, left 04/05/2017  . Peripheral polyneuropathy 04/05/2017  . Lumbar radiculopathy 04/05/2017  . Tremor 02/13/2017  . Sensory neuropathy 02/13/2017  . Restless leg syndrome 02/13/2017  . Acute diarrhea 09/06/2016  . History of pyelonephritis 09/06/2016  . Hypokalemia 09/06/2016  . 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 Pyelonephritis 08/29/2016  . Rheumatoid arthritis, unspecified ([place]) 08/29/2016  . Nontraumatic complete tear of right rotator cuff 03/08/2016  . Acute pain of right shoulder 01/27/2016  . Type 2 diabetes mellitus with diabetic neuropathy ([place]) 10/27/2015  .  Crohn's colitis ([place]) 09/04/2015  . Acute bilateral low back pain without sciatica 03/23/2015  . SOB (shortness of breath) on exertion 01/15/2015  . Pedal edema 01/15/2015  . Status post open reduction with internal fixation of fracture 11/27/2014  . Painful rib 09/11/2014  . Osteoporosis 03/17/2014  . Depression 03/17/2014  . B12 deficiency 03/17/2014  . Crohn's disease, unspecified, without complications ([place]) 49/82/6415  . Urolith 07/16/2013  . Right flank pain 07/16/2013  . Abdominal pain 07/16/2013    Rosalyn Gess OTR/L,CLT 10/02/2019, 7:10 PM  [facility] [address] [place], [address] Phone: [phone]   Fax:  [phone]  Name: PATTIJO JUSTE MRN: [id number] Date of Birth: 30-Aug-1950

## 2019-10-09 ENCOUNTER — Other Ambulatory Visit: Payer: Self-pay

## 2019-10-09 ENCOUNTER — Ambulatory Visit
Admission: RE | Admit: 2019-10-09 | Discharge: 2019-10-09 | Disposition: A | Discharge status: Home / Self Care | Payer: Medicare Other | Source: Ambulatory Visit | Attending: Otolaryngology | Admitting: Otolaryngology

## 2019-10-09 DIAGNOSIS — H9122 Sudden idiopathic hearing loss, left ear: Secondary | ICD-10-CM

## 2019-10-09 DIAGNOSIS — M6281 Muscle weakness (generalized): Secondary | ICD-10-CM

## 2019-10-09 MED ORDER — GADOBENATE DIMEGLUMINE 529 MG/ML IV SOLN
18.0000 mL | Freq: Once | INTRAVENOUS | Status: AC | PRN
  Administered 2019-10-09: 18 mL via INTRAVENOUS

## 2019-10-16 ENCOUNTER — Ambulatory Visit: Payer: Medicare Other

## 2019-10-21 ENCOUNTER — Encounter: Payer: Medicare Other | Admitting: Occupational Therapy

## 2019-10-22 ENCOUNTER — Ambulatory Visit: Payer: Medicare Other | Attending: Rheumatology | Admitting: Occupational Therapy

## 2019-10-22 ENCOUNTER — Other Ambulatory Visit: Payer: Self-pay

## 2019-10-22 DIAGNOSIS — M6281 Muscle weakness (generalized): Secondary | ICD-10-CM | POA: Diagnosis present

## 2019-10-22 DIAGNOSIS — R208 Other disturbances of skin sensation: Secondary | ICD-10-CM

## 2019-10-22 DIAGNOSIS — R209 Unspecified disturbances of skin sensation: Secondary | ICD-10-CM | POA: Insufficient documentation

## 2019-10-22 NOTE — Patient Instructions (Signed)
Add composite flexors stretch for wrist , hand and forearm

## 2019-10-22 NOTE — Therapy (Signed)
[facility] [address], [place], [address] Phone: [phone]   Fax:  [phone]  Occupational Therapy Treatment  Patient Details  Name: Nicole Bass MRN: [id number] Date of Birth: 1950/12/24 Referring Provider (OT): Dr Jefm Bryant   Encounter Date: 10/22/2019  OT End of Session - 10/22/19 1314    Visit Number  3    Number of Visits  4    Date for OT Re-Evaluation  10/30/19    OT Start Time  1300    OT Stop Time  1330    OT Time Calculation (min)  30 min    Activity Tolerance  Patient tolerated treatment well    Behavior During Therapy  [facility] for tasks assessed/performed       Past Medical History:  Diagnosis Date  . Anemia    iron everyday, 3 iron infusions last one August 2020  . Arthritis    rheumatoid  . Collagen vascular disease ([place])   . Crohn's disease ([place])   . Crohn's disease ([place])   . DDD (degenerative disc disease), cervical   . DDD (degenerative disc disease), cervical   . DDD (degenerative disc disease), cervical 2003  . DDD (degenerative disc disease), lumbar   . Degenerative disc disease, cervical   . Dyspnea    out of shape  . GERD (gastroesophageal reflux disease)   . Hand weakness    left hand worse, small tremors  . Hypertension     controlled on meds  . Neuromuscular disorder ([place])   . Neuropathy    legs  . UTI (urinary tract infection)    HO  . Vertigo    can be accompanied by nausea  . Wears dentures    upper dentures    Past Surgical History:  Procedure Laterality Date  . ABDOMINAL HYSTERECTOMY    . APPENDECTOMY    . BACK SURGERY     x 3  . CERVICAL SPINE SURGERY    . [place]  . CHOLECYSTECTOMY    . COLON SURGERY  1999   removed 18 inches of colon,removed appendix  . DILATION AND CURETTAGE OF UTERUS  2004  . ELBOW FRACTURE SURGERY  2008  . JOINT REPLACEMENT Right 2015   partial knee replacement  . KYPHOPLASTY    . KYPHOPLASTY  N/A 04/14/2015   Procedure: KYPHOPLASTY;  Surgeon: Hessie Knows, MD;  Location: [facility];  Service: Orthopedics;  Laterality: N/A;  . KYPHOPLASTY N/A 06/04/2015   Procedure: KYPHOPLASTY L-5;  Surgeon: Hessie Knows, MD;  Location: [facility];  Service: Orthopedics;  Laterality: N/A;  . MEDIAL PARTIAL KNEE REPLACEMENT    . OOPHORECTOMY Bilateral   . REPAIR EXTENSOR TENDON Left 01/08/2019   Procedure: Realignment  EXTENSOR TENDON;  Surgeon: Hessie Knows, MD;  Location: [facility];  Service: Orthopedics;  Laterality: Left;  . REVERSE SHOULDER ARTHROPLASTY Right 04/18/2019   Procedure: REVERSE SHOULDER ARTHROPLASTY;  Surgeon: Corky Mull, MD;  Location: [facility];  Service: Orthopedics;  Laterality: Right;  . SHOULDER ARTHROSCOPY WITH ROTATOR CUFF REPAIR AND SUBACROMIAL DECOMPRESSION Right 04/13/2016   Procedure: SHOULDER ARTHROSCOPY WITH ROTATOR CUFF REPAIR AND SUBACROMIAL DECOMPRESSION, release of long head biceps tendon;  Surgeon: Leanor Kail, MD;  Location: [facility];  Service: Orthopedics;  Laterality: Right;  . TOE SURGERY  2013  . TONSILLECTOMY    . WRIST FRACTURE SURGERY Bilateral     There were no vitals filed for this visit.  Subjective Assessment -  10/22/19 1309    Subjective   I had busy week- MRI showed not issues- but my L hand goes more numb now - but not constant    Pertinent History  Pt has history of RA, surgery to 2nd and 3rd digits last year, sensory neuropathy in feet - pt was in ER 08/25/2019  with had in fist and could not open it - since then had numbness in thumb thru 4th digit- but report thumb pain and numbness better - did had shot on 08/26/2019 from Dr Raliegh Ip - nerve conduction test yesterday per pt was negative for CT per Dr Melrose Nakayama    Patient Stated Goals  I want to numbness better- so I can use my hand - and the tremor better    Currently in Pain?  No/denies        tightness in forearm flexors into 3rd digit and thumb this date - still to tender to tolerate graston tool nr 2  on volar forearm and manual by OT  did get verbal order to do dryneelding - Pt - Wes done on flexor digitorum one needle  - good results - not as tender after wards        Wear wrist splint as much as she can - except ADL's , HEP at home  And done and add composite flexor stretch for wrist, forearm and hand to HEP hoold 5 sec - few times  and cont with   Tendon glides  MEd N glides 5 reps        end of session  OT Treatments/Exercises (OP) - 10/22/19 0001      Cryotherapy   Number Minutes Cryotherapy  4 Minutes    Cryotherapy Location  Forearm    Type of Cryotherapy  Ice pack             OT Education - 10/22/19 1314    Education provided  Yes    Education Details  HEP review and dryneedling    Person(s) Educated  Patient    Methods  Explanation;Demonstration;Tactile cues;Verbal cues;Handout    Comprehension  Verbal cues required;Returned demonstration;Verbalized understanding          OT Long Term Goals - 09/18/19 1801      OT LONG TERM GOAL #1   Title  Pt report decrease numbness in 2nd thru 4th digits to do buttons , cutting food better    Baseline  trouble with small objects , numbness 2nd to 4th tips    Time  4    Period  Weeks    Status  New    Target Date  10/16/19      OT LONG TERM GOAL #2   Title  Pt show increase 3 point pinch with 3 lbs to cut with knife and fork    Baseline  3 point pinch L 4 lbs , R 11 lbs - cannot hold fork to cut    Time  6    Period  Weeks    Status  New    Target Date  10/30/19            Plan - 10/22/19 1314    Clinical Impression Statement  Pt cont to have sensory issues in L hand - did get verbal order from Dr Jefm Bryant for PT  to do dryneelding on forearm flexors - done one needle on flexor digitorium - with good results- was able to palpate little deeper -will repeat next week    OT Occupational  Profile and History  Problem Focused Assessment - Including review of records relating to presenting problem     Occupational performance deficits (Please refer to evaluation for details):  ADL's;IADL's;Rest and Sleep;Play;Leisure    Body Structure / Function / Physical Skills  ADL;UE functional use;FMC;Strength;Sensation    Rehab Potential  Fair    Clinical Decision Making  Limited treatment options, no task modification necessary    Comorbidities Affecting Occupational Performance:  May have comorbidities impacting occupational performance    Modification or Assistance to Complete Evaluation   No modification of tasks or assist necessary to complete eval    OT Frequency  1x / week    OT Duration  4 weeks    OT Treatment/Interventions  Self-care/ADL training;Patient/family education;Splinting;Contrast Bath;Manual Therapy;Therapeutic exercise    Plan  assess progress with HEP -assess  dryneedling results    OT Home Exercise Plan  see pt instruction    Consulted and Agree with Plan of Care  Patient       Patient will benefit from skilled therapeutic intervention in order to improve the following deficits and impairments:   Body Structure / Function / Physical Skills: ADL, UE functional use, FMC, Strength, Sensation       Visit Diagnosis: Muscle weakness (generalized)  Other disturbances of skin sensation    Problem List Patient Active Problem List   Diagnosis Date Noted  . 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 Rotator cuff arthropathy, right 01/25/2019  . Iron deficiency anemia 09/13/2018  . Anemia, unspecified 09/09/2018  . Acute kidney injury ([place]) 08/23/2018  . HTN, goal below 140/80 06/04/2018  . Hyperthyroidism 02/08/2018  . Stomatitis, ulcerative 09/06/2017  . Dyspareunia in female 08/22/2017  . Vaginal atrophy 08/22/2017  . Obesity (BMI 30.0-34.9) 08/22/2017  . Leg pain, left 04/05/2017  . Peripheral polyneuropathy 04/05/2017  . Lumbar radiculopathy 04/05/2017  . Tremor 02/13/2017  . Sensory neuropathy 02/13/2017  . Restless leg syndrome  02/13/2017  . Acute diarrhea 09/06/2016  . History of pyelonephritis 09/06/2016  . Hypokalemia 09/06/2016  . 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 Pyelonephritis 08/29/2016  . Rheumatoid arthritis, unspecified ([place]) 08/29/2016  . Nontraumatic complete tear of right rotator cuff 03/08/2016  . Acute pain of right shoulder 01/27/2016  . Type 2 diabetes mellitus with diabetic neuropathy ([place]) 10/27/2015  . Crohn's colitis ([place]) 09/04/2015  . Acute bilateral low back pain without sciatica 03/23/2015  . SOB (shortness of breath) on exertion 01/15/2015  . Pedal edema 01/15/2015  . Status post open reduction with internal fixation of fracture 11/27/2014  . Painful rib 09/11/2014  . Osteoporosis 03/17/2014  . Depression 03/17/2014  . B12 deficiency 03/17/2014  . Crohn's disease, unspecified, without complications ([place]) 62/22/9798  . Urolith 07/16/2013  . Right flank pain 07/16/2013  . Abdominal pain 07/16/2013    Rosalyn Gess OTR/L,CLT 10/22/2019, 1:37 PM  [facility] [address] [place], [address] Phone: [phone]   Fax:  [phone]  Name: Nicole Bass MRN: [id number] Date of Birth: Mar 20, 1951

## 2019-10-29 ENCOUNTER — Ambulatory Visit: Payer: Medicare Other | Admitting: Occupational Therapy

## 2019-10-29 ENCOUNTER — Other Ambulatory Visit: Payer: Self-pay

## 2019-10-29 DIAGNOSIS — R202 Paresthesia of skin: Secondary | ICD-10-CM | POA: Insufficient documentation

## 2019-10-29 DIAGNOSIS — M6281 Muscle weakness (generalized): Secondary | ICD-10-CM | POA: Diagnosis not present

## 2019-10-29 DIAGNOSIS — R208 Other disturbances of skin sensation: Secondary | ICD-10-CM

## 2019-10-29 DIAGNOSIS — R200 Anesthesia of skin: Secondary | ICD-10-CM | POA: Insufficient documentation

## 2019-10-29 NOTE — Patient Instructions (Signed)
Same

## 2019-10-29 NOTE — Therapy (Signed)
[facility] [address] [place], [address] Phone: [phone]   Fax:  [phone]  Occupational Therapy Treatment  Patient Details  Name: Nicole Bass MRN: [id number] Date of Birth: 1950-12-03 Referring Provider (OT): Dr Jefm Bryant   Encounter Date: 10/29/2019  OT End of Session - 10/29/19 1148    Visit Number  4    Number of Visits  8    Date for OT Re-Evaluation  11/26/19    OT Start Time  1115    OT Stop Time  1158    OT Time Calculation (min)  43 min    Activity Tolerance  Patient tolerated treatment well    Behavior During Therapy  [facility] for tasks assessed/performed       Past Medical History:  Diagnosis Date  . Anemia    iron everyday, 3 iron infusions last one August 2020  . Arthritis    rheumatoid  . Collagen vascular disease ([place])   . Crohn's disease ([place])   . Crohn's disease ([place])   . DDD (degenerative disc disease), cervical   . DDD (degenerative disc disease), cervical   . DDD (degenerative disc disease), cervical 2003  . DDD (degenerative disc disease), lumbar   . Degenerative disc disease, cervical   . Dyspnea    out of shape  . GERD (gastroesophageal reflux disease)   . Hand weakness    left hand worse, small tremors  . Hypertension     controlled on meds  . Neuromuscular disorder ([place])   . Neuropathy    legs  . UTI (urinary tract infection)    HO  . Vertigo    can be accompanied by nausea  . Wears dentures    upper dentures    Past Surgical History:  Procedure Laterality Date  . ABDOMINAL HYSTERECTOMY    . APPENDECTOMY    . BACK SURGERY     x 3  . CERVICAL SPINE SURGERY    . [place]  . CHOLECYSTECTOMY    . COLON SURGERY  1999   removed 18 inches of colon,removed appendix  . DILATION AND CURETTAGE OF UTERUS  2004  . ELBOW FRACTURE SURGERY  2008  . JOINT REPLACEMENT Right 2015   partial knee replacement  . KYPHOPLASTY    . KYPHOPLASTY  N/A 04/14/2015   Procedure: KYPHOPLASTY;  Surgeon: Hessie Knows, MD;  Location: [facility] ORS;  Service: Orthopedics;  Laterality: N/A;  . KYPHOPLASTY N/A 06/04/2015   Procedure: KYPHOPLASTY L-5;  Surgeon: Hessie Knows, MD;  Location: [facility] ORS;  Service: Orthopedics;  Laterality: N/A;  . MEDIAL PARTIAL KNEE REPLACEMENT    . OOPHORECTOMY Bilateral   . REPAIR EXTENSOR TENDON Left 01/08/2019   Procedure: Realignment  EXTENSOR TENDON;  Surgeon: Hessie Knows, MD;  Location: [facility] ORS;  Service: Orthopedics;  Laterality: Left;  . REVERSE SHOULDER ARTHROPLASTY Right 04/18/2019   Procedure: REVERSE SHOULDER ARTHROPLASTY;  Surgeon: Corky Mull, MD;  Location: [facility] ORS;  Service: Orthopedics;  Laterality: Right;  . SHOULDER ARTHROSCOPY WITH ROTATOR CUFF REPAIR AND SUBACROMIAL DECOMPRESSION Right 04/13/2016   Procedure: SHOULDER ARTHROSCOPY WITH ROTATOR CUFF REPAIR AND SUBACROMIAL DECOMPRESSION, release of long head biceps tendon;  Surgeon: Leanor Kail, MD;  Location: [facility] ORS;  Service: Orthopedics;  Laterality: Right;  . TOE SURGERY  2013  . TONSILLECTOMY    . WRIST FRACTURE SURGERY Bilateral     There were no vitals filed for this visit.  Subjective Assessment -  10/29/19 1146    Subjective   Numbness still in the 2nd thru 4th digits -and thumb little bit in tip - coming and going at the thumb - still dropping things    Pertinent History  Pt has history of RA, surgery to 2nd and 3rd digits last year, sensory neuropathy in feet - pt was in ER 08/25/2019  with had in fist and could not open it - since then had numbness in thumb thru 4th digit- but report thumb pain and numbness better - did had shot on 08/26/2019 from Dr Raliegh Ip - nerve conduction test yesterday per pt was negative for CT per Dr Melrose Nakayama    Patient Stated Goals  I want to numbness better- so I can use my hand - and the tremor better    Currently in Pain?  No/denies         [facility] OT Assessment - 10/29/19 0001      Strength   Right Hand Grip  (lbs)  45    Left Hand Grip (lbs)  30    Left Hand Lateral Pinch  7 lbs    Left Hand 3 Point Pinch  7 lbs       assess grip and prehension  About the same - lat grip L hand increase   pt in range for grip and prehension for her age She cont to have trigger points on flexor digitorum on forearm - to radial side able tolerate graston tool some what - could not tolerate at all last time  on mid forearm - still not able to tolerate  semmes weinstein done - see impression for improvement compare to about 3-4 wks ago        tightness in forearm flexors into 3rd digit and thumb still  this date -  did get verbal order to do dryneelding 2 wks ago  - PT - Wes done on flexor digitorum 2 needles this date   - good results - not as tender after wards      Wear wrist splint as much as she can - except ADL's , HEPat home  And done and add composite flexor stretch for wrist, forearm and hand to HEP hoold 5 sec - few times Combination nerve glides 10 reps  3 x day Scapula squeezes 10 reps   Tendon glides  MEd N glides5 reps              OT Education - 10/29/19 1147    Education provided  Yes    Education Details  HEP review and dryneedling    Person(s) Educated  Patient    Methods  Explanation;Demonstration;Tactile cues;Verbal cues;Handout    Comprehension  Verbal cues required;Returned demonstration;Verbalized understanding          OT Long Term Goals - 09/18/19 1801      OT LONG TERM GOAL #1   Title  Pt report decrease numbness in 2nd thru 4th digits to do buttons , cutting food better    Baseline  trouble with small objects , numbness 2nd to 4th tips    Time  4    Period  Weeks    Status  New    Target Date  10/16/19      OT LONG TERM GOAL #2   Title  Pt show increase 3 point pinch with 3 lbs to cut with knife and fork    Baseline  3 point pinch L 4 lbs , R 11 lbs - cannot hold fork  to cut    Time  6    Period  Weeks    Status  New    Target Date   10/30/19            Plan - 10/29/19 1209    Clinical Impression Statement  Pt cont to report sensation issues in L hand 2nd thru 4th digit and come and goes in tips of thumb - pt do show improvement compare to Febr 21st in [place] Weinstein to 2.83 in 2nd and 4th DIP and middle phalanges 3rd - and 3.61 at DIP of 3rd and tip of thumb - grip and prehension about the same as last time - but had surgeries in past in hands and arthritis - in range for her age - cont dry needling on forearm flexors    OT Occupational Profile and History  Problem Focused Assessment - Including review of records relating to presenting problem    Occupational performance deficits (Please refer to evaluation for details):  ADL's;IADL's;Rest and Sleep;Play;Leisure    Body Structure / Function / Physical Skills  ADL;UE functional use;FMC;Strength;Sensation    Rehab Potential  Fair    Clinical Decision Making  Limited treatment options, no task modification necessary    Comorbidities Affecting Occupational Performance:  May have comorbidities impacting occupational performance    Modification or Assistance to Complete Evaluation   No modification of tasks or assist necessary to complete eval    OT Frequency  1x / week    OT Duration  4 weeks    OT Treatment/Interventions  Self-care/ADL training;Patient/family education;Splinting;Contrast Bath;Manual Therapy;Therapeutic exercise    Plan  assess progress with HEP -assess  dryneedling results    OT Home Exercise Plan  see pt instruction    Consulted and Agree with Plan of Care  Patient       Patient will benefit from skilled therapeutic intervention in order to improve the following deficits and impairments:   Body Structure / Function / Physical Skills: ADL, UE functional use, FMC, Strength, Sensation       Visit Diagnosis: Muscle weakness (generalized) - Plan: Ot plan of care cert/re-cert  Other disturbances of skin sensation - Plan: Ot plan of care  cert/re-cert    Problem List Patient Active Problem List   Diagnosis Date Noted  . 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 Rotator cuff arthropathy, right 01/25/2019  . Iron deficiency anemia 09/13/2018  . Anemia, unspecified 09/09/2018  . Acute kidney injury ([place]) 08/23/2018  . HTN, goal below 140/80 06/04/2018  . Hyperthyroidism 02/08/2018  . Stomatitis, ulcerative 09/06/2017  . Dyspareunia in female 08/22/2017  . Vaginal atrophy 08/22/2017  . Obesity (BMI 30.0-34.9) 08/22/2017  . Leg pain, left 04/05/2017  . Peripheral polyneuropathy 04/05/2017  . Lumbar radiculopathy 04/05/2017  . Tremor 02/13/2017  . Sensory neuropathy 02/13/2017  . Restless leg syndrome 02/13/2017  . Acute diarrhea 09/06/2016  . History of pyelonephritis 09/06/2016  . Hypokalemia 09/06/2016  . 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 Pyelonephritis 08/29/2016  . Rheumatoid arthritis, unspecified ([place]) 08/29/2016  . Nontraumatic complete tear of right rotator cuff 03/08/2016  . Acute pain of right shoulder 01/27/2016  . Type 2 diabetes mellitus with diabetic neuropathy ([place]) 10/27/2015  . Crohn's colitis ([place]) 09/04/2015  . Acute bilateral low back pain without sciatica 03/23/2015  . SOB (shortness of breath) on exertion 01/15/2015  . Pedal edema 01/15/2015  . Status post open reduction with internal fixation of fracture 11/27/2014  . Painful  rib 09/11/2014  . Osteoporosis 03/17/2014  . Depression 03/17/2014  . B12 deficiency 03/17/2014  . Crohn's disease, unspecified, without complications ([place]) 44/73/9584  . Urolith 07/16/2013  . Right flank pain 07/16/2013  . Abdominal pain 07/16/2013    Rosalyn Gess  OTR/L,CLT 10/29/2019, 12:14 PM  [facility] [address], [place], [address] Phone: [phone]   Fax:  [phone]  Name: Nicole Bass MRN: [id number] Date of Birth:  01/10/1951

## 2019-11-07 ENCOUNTER — Other Ambulatory Visit: Payer: Self-pay

## 2019-11-07 ENCOUNTER — Ambulatory Visit: Payer: Medicare Other | Admitting: Occupational Therapy

## 2019-11-07 DIAGNOSIS — M6281 Muscle weakness (generalized): Secondary | ICD-10-CM | POA: Diagnosis not present

## 2019-11-07 DIAGNOSIS — R208 Other disturbances of skin sensation: Secondary | ICD-10-CM

## 2019-11-07 NOTE — Therapy (Signed)
[facility] [address], [place], [address] Phone: [phone]   Fax:  [phone]  Occupational Therapy Treatment  Patient Details  Name: Nicole Bass MRN: [id number] Date of Birth: 1951/06/28 Referring Provider (OT): Dr Jefm Bryant   Encounter Date: 11/07/2019  OT End of Session - 11/07/19 1612    Visit Number  5    Number of Visits  8    Date for OT Re-Evaluation  11/26/19    OT Start Time  1430    OT Stop Time  1510    OT Time Calculation (min)  40 min    Activity Tolerance  Patient tolerated treatment well    Behavior During Therapy  [facility] for tasks assessed/performed       Past Medical History:  Diagnosis Date  . Anemia    iron everyday, 3 iron infusions last one August 2020  . Arthritis    rheumatoid  . Collagen vascular disease ([place])   . Crohn's disease ([place])   . Crohn's disease ([place])   . DDD (degenerative disc disease), cervical   . DDD (degenerative disc disease), cervical   . DDD (degenerative disc disease), cervical 2003  . DDD (degenerative disc disease), lumbar   . Degenerative disc disease, cervical   . Dyspnea    out of shape  . GERD (gastroesophageal reflux disease)   . Hand weakness    left hand worse, small tremors  . Hypertension     controlled on meds  . Neuromuscular disorder ([place])   . Neuropathy    legs  . UTI (urinary tract infection)    HO  . Vertigo    can be accompanied by nausea  . Wears dentures    upper dentures    Past Surgical History:  Procedure Laterality Date  . ABDOMINAL HYSTERECTOMY    . APPENDECTOMY    . BACK SURGERY     x 3  . CERVICAL SPINE SURGERY    . [place]  . CHOLECYSTECTOMY    . COLON SURGERY  1999   removed 18 inches of colon,removed appendix  . DILATION AND CURETTAGE OF UTERUS  2004  . ELBOW FRACTURE SURGERY  2008  . JOINT REPLACEMENT Right 2015   partial knee replacement  . KYPHOPLASTY    . KYPHOPLASTY  N/A 04/14/2015   Procedure: KYPHOPLASTY;  Surgeon: Hessie Knows, MD;  Location: [facility] OR[facility];  Service: Orthopedics;  Laterality: N/A;  . KYPHOPLASTY N/A 06/04/2015   Procedure: KYPHOPLASTY L-5;  Surgeon: Hessie Knows, MD;  Location: [facility];  Service: Orthopedics;  Laterality: N/A;  . MEDIAL PARTIAL KNEE REPLACEMENT    . OOPHORECTOMY Bilateral   . REPAIR EXTENSOR TENDON Left 01/08/2019   Procedure: Realignment  EXTENSOR TENDON;  Surgeon: Hessie Knows, MD;  Location: [facility];  Service: Orthopedics;  Laterality: Left;  . REVERSE SHOULDER ARTHROPLASTY Right 04/18/2019   Procedure: REVERSE SHOULDER ARTHROPLASTY;  Surgeon: Corky Mull, MD;  Location: [facility] OR[facility];  Service: Orthopedics;  Laterality: Right;  . SHOULDER ARTHROSCOPY WITH ROTATOR CUFF REPAIR AND SUBACROMIAL DECOMPRESSION Right 04/13/2016   Procedure: SHOULDER ARTHROSCOPY WITH ROTATOR CUFF REPAIR AND SUBACROMIAL DECOMPRESSION, release of long head biceps tendon;  Surgeon: Leanor Kail, MD;  Location: [facility] ORS;  Service: Orthopedics;  Laterality: Right;  . TOE SURGERY  2013  . TONSILLECTOMY    . WRIST FRACTURE SURGERY Bilateral     There were no vitals filed for this visit.  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  Yes    Pain Location  Hip    Pain Orientation  Right    Pain Descriptors / Indicators   Aching    Pain Type  Chronic pain    Pain Onset  More than a month ago    Pain Frequency  Constant         [facility] OT Assessment - 11/07/19 0001      Strength   Left Hand Grip (lbs)  30    Left Hand Lateral Pinch  6 lbs    Left Hand 3 Point Pinch  7 lbs        assess grip and prehension  About the same -compare to last week   pt in range for grip and prehension for her age She cont to have trigger points on flexor digitorum on forearm but smaller area  - to radial side able tolerate graston tool some what - could not tolerate at all last time  on mid forearm - still not able to tolerate  semmes weinstein done - see impression for improvement compare to about last week in thumb       tightness in forearm flexors into 3rd digit and thumb still this date - did get verbal order to do dryneelding 3 wks ago  -PT - Wes done on flexor digitorum 2 needles this date  - good results - not as tender after wards     Wear wrist splint as much as she can - except ADL's , HEPat home  Done and pt to cont with  composite flexor stretch for wrist, forearm and hand to HEP hoold 5 sec - few times Combination nerve glides 10 reps  3 x day Scapula squeezes 10 reps  Tendon glides  MEd N glides5 reps        OT Treatments/Exercises (OP) - 11/07/19 0001      Cryotherapy   Number Minutes Cryotherapy  4 Minutes    Cryotherapy Location  Forearm    Type of Cryotherapy  Ice pack             OT Education - 11/07/19 1449    Education provided  Yes    Education Details  HEP review and dryneedling    Person(s) Educated  Patient    Methods  Explanation;Demonstration;Tactile cues;Verbal cues;Handout    Comprehension  Verbal cues required;Returned demonstration;Verbalized understanding          OT Long Term Goals - 09/18/19 1801      OT LONG TERM GOAL #1   Title  Pt report decrease numbness in 2nd thru 4th digits to do buttons , cutting food better    Baseline  trouble  with small objects , numbness 2nd to 4th tips    Time  4    Period  Weeks    Status  New    Target Date  10/16/19      OT LONG TERM GOAL #2   Title  Pt show increase 3 point pinch with 3 lbs to cut with knife and fork    Baseline  3 point pinch L 4 lbs , R 11 lbs - cannot hold fork to cut    Time  6    Period  Weeks    Status  New    Target Date  10/30/19            Plan - 11/07/19 1613    Clinical Impression Statement  Pt cont present with reports of hives this date - under stress with son having CABG x 4 this week and taking care of grandson that is 5 - cont to report sensation issues in L hand  thumb and 3rd digit worse  and come and goes in tips of thumb - pt do show improvement compare to last week in  [place] to 2.83 in 2nd and 4th DIP and tip of thumb this date ,as well as middle phalanges 3rd - and 3.61 at DIP of 3rd - grip and prehension about the same as last time - but had surgeries in past in hands and arthritis - in range for her age - cont dry needling on forearm flexors - smaller area of spasm    OT Occupational Profile and History  Problem Focused Assessment - Including review of records relating to presenting problem    Occupational performance deficits (Please refer to evaluation for details):  ADL's;IADL's;Rest and Sleep;Play;Leisure    Body Structure / Function / Physical Skills  ADL;UE functional use;FMC;Strength;Sensation    Rehab Potential  Fair    Clinical Decision Making  Limited treatment options, no task modification necessary    Comorbidities Affecting Occupational Performance:  May have comorbidities impacting occupational performance    Modification or Assistance to Complete Evaluation   No modification of tasks or assist necessary to complete eval    OT Frequency  1x / week    OT Duration  4 weeks    OT Treatment/Interventions  Self-care/ADL training;Patient/family education;Splinting;Contrast Bath;Manual Therapy;Therapeutic exercise    Plan   assess progress with HEP -assess  dryneedling results    OT Home Exercise Plan  see pt instruction    Consulted and Agree with Plan of Care  Patient       Patient will benefit from skilled therapeutic intervention in order to improve the following deficits and impairments:   Body Structure / Function / Physical Skills: ADL, UE functional use, FMC, Strength, Sensation       Visit Diagnosis: Muscle weakness (generalized)  Other disturbances of skin sensation    Problem List Patient Active Problem List   Diagnosis Date Noted  . 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 Rotator cuff arthropathy, right 01/25/2019  . Iron deficiency anemia 09/13/2018  . Anemia, unspecified 09/09/2018  . Acute kidney injury ([place]) 08/23/2018  . HTN, goal below 140/80 06/04/2018  . Hyperthyroidism 02/08/2018  . Stomatitis, ulcerative 09/06/2017  . Dyspareunia in female 08/22/2017  . Vaginal atrophy 08/22/2017  . Obesity (BMI 30.0-34.9) 08/22/2017  . Leg pain, left 04/05/2017  . Peripheral polyneuropathy 04/05/2017  . Lumbar radiculopathy 04/05/2017  . Tremor 02/13/2017  . Sensory neuropathy 02/13/2017  . Restless  leg syndrome 02/13/2017  . Acute diarrhea 09/06/2016  . History of pyelonephritis 09/06/2016  . Hypokalemia 09/06/2016  . 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 Pyelonephritis 08/29/2016  . Rheumatoid arthritis, unspecified ([place]) 08/29/2016  . Nontraumatic complete tear of right rotator cuff 03/08/2016  . Acute pain of right shoulder 01/27/2016  . Type 2 diabetes mellitus with diabetic neuropathy ([place]) 10/27/2015  . Crohn's colitis ([place]) 09/04/2015  . Acute bilateral low back pain without sciatica 03/23/2015  . SOB (shortness of breath) on exertion 01/15/2015  . Pedal edema 01/15/2015  . Status post open reduction with internal fixation of fracture 11/27/2014  . Painful rib 09/11/2014  . Osteoporosis 03/17/2014  . Depression 03/17/2014  .  B12 deficiency 03/17/2014  . Crohn's disease, unspecified, without complications ([place]) 80/16/5537  . Urolith 07/16/2013  . Right flank pain 07/16/2013  . Abdominal pain 07/16/2013    Rosalyn Gess OTR/L,CLT 11/07/2019, 4:17 PM  [place] [facility] [address] [place], [address] Phone: [phone]   Fax:  [phone]  Name: KACEY DYSERT MRN: [id number] Date of Birth: 09-01-1950

## 2019-11-07 NOTE — Patient Instructions (Signed)
same

## 2019-11-21 ENCOUNTER — Other Ambulatory Visit: Payer: Self-pay

## 2019-11-21 ENCOUNTER — Ambulatory Visit: Payer: Medicare Other | Attending: Rheumatology | Admitting: Occupational Therapy

## 2019-11-21 DIAGNOSIS — R209 Unspecified disturbances of skin sensation: Secondary | ICD-10-CM | POA: Diagnosis present

## 2019-11-21 DIAGNOSIS — M6281 Muscle weakness (generalized): Secondary | ICD-10-CM | POA: Insufficient documentation

## 2019-11-21 DIAGNOSIS — R208 Other disturbances of skin sensation: Secondary | ICD-10-CM

## 2019-11-21 NOTE — Therapy (Signed)
[facility] [address], [place], [address] Phone: [phone]   Fax:  [phone]  Occupational Therapy Treatment  Patient Details  Name: Nicole Bass MRN: [id number] Date of Birth: 1951-02-20 Referring Provider (OT): Dr Jefm Bryant   Encounter Date: 11/21/2019  OT End of Session - 11/21/19 2141    Visit Number  6    Number of Visits  8    Date for OT Re-Evaluation  11/26/19    OT Start Time  1504    OT Stop Time  1542    OT Time Calculation (min)  38 min    Activity Tolerance  Patient tolerated treatment well    Behavior During Therapy  [facility] for tasks assessed/performed       Past Medical History:  Diagnosis Date  . Anemia    iron everyday, 3 iron infusions last one August 2020  . Arthritis    rheumatoid  . Collagen vascular disease ([place])   . Crohn's disease ([place])   . Crohn's disease ([place])   . DDD (degenerative disc disease), cervical   . DDD (degenerative disc disease), cervical   . DDD (degenerative disc disease), cervical 2003  . DDD (degenerative disc disease), lumbar   . Degenerative disc disease, cervical   . Dyspnea    out of shape  . GERD (gastroesophageal reflux disease)   . Hand weakness    left hand worse, small tremors  . Hypertension     controlled on meds  . Neuromuscular disorder ([place])   . Neuropathy    legs  . UTI (urinary tract infection)    HO  . Vertigo    can be accompanied by nausea  . Wears dentures    upper dentures    Past Surgical History:  Procedure Laterality Date  . ABDOMINAL HYSTERECTOMY    . APPENDECTOMY    . BACK SURGERY     x 3  . CERVICAL SPINE SURGERY    . [place]  . CHOLECYSTECTOMY    . COLON SURGERY  1999   removed 18 inches of colon,removed appendix  . DILATION AND CURETTAGE OF UTERUS  2004  . ELBOW FRACTURE SURGERY  2008  . JOINT REPLACEMENT Right 2015   partial knee replacement  . KYPHOPLASTY    . KYPHOPLASTY  N/A 04/14/2015   Procedure: KYPHOPLASTY;  Surgeon: Hessie Knows, MD;  Location: [facility] ORS;  Service: Orthopedics;  Laterality: N/A;  . KYPHOPLASTY N/A 06/04/2015   Procedure: KYPHOPLASTY L-5;  Surgeon: Hessie Knows, MD;  Location: [facility] ORS;  Service: Orthopedics;  Laterality: N/A;  . MEDIAL PARTIAL KNEE REPLACEMENT    . OOPHORECTOMY Bilateral   . REPAIR EXTENSOR TENDON Left 01/08/2019   Procedure: Realignment  EXTENSOR TENDON;  Surgeon: Hessie Knows, MD;  Location: [facility] ORS;  Service: Orthopedics;  Laterality: Left;  . REVERSE SHOULDER ARTHROPLASTY Right 04/18/2019   Procedure: REVERSE SHOULDER ARTHROPLASTY;  Surgeon: Corky Mull, MD;  Location: [facility] ORS;  Service: Orthopedics;  Laterality: Right;  . SHOULDER ARTHROSCOPY WITH ROTATOR CUFF REPAIR AND SUBACROMIAL DECOMPRESSION Right 04/13/2016   Procedure: SHOULDER ARTHROSCOPY WITH ROTATOR CUFF REPAIR AND SUBACROMIAL DECOMPRESSION, release of long head biceps tendon;  Surgeon: Leanor Kail, MD;  Location: [facility] ORS;  Service: Orthopedics;  Laterality: Right;  . TOE SURGERY  2013  . TONSILLECTOMY    . WRIST FRACTURE SURGERY Bilateral     There were no vitals filed for this visit.  Subjective Assessment -  11/21/19 2139    Subjective   My arm only went numb on time this past 2 wks - but I cannot remember what  Idone - and finger tips only the tips now with 3rd the worse and thumb and 4th little - seeing Dr Ginger Organ    Pertinent History  Pt has history of RA, surgery to 2nd and 3rd digits last year, sensory neuropathy in feet - pt was in ER 08/25/2019  with had in fist and could not open it - since then had numbness in thumb thru 4th digit- but report thumb pain and numbness better - did had shot on 08/26/2019 from Dr Raliegh Ip - nerve conduction test yesterday per pt was negative for CT per Dr Melrose Nakayama    Patient Stated Goals  I want to numbness better- so I can use my hand - and the tremor better    Currently in Pain?  No/denies         [facility] OT  Assessment - 11/21/19 0001      Strength   Right Hand Grip (lbs)  45    Right Hand Lateral Pinch  11 lbs    Right Hand 3 Point Pinch  9 lbs    Left Hand Grip (lbs)  30    Left Hand Lateral Pinch  7 lbs    Left Hand 3 Point Pinch  7 lbs          assess grip and prehension About the same -compare to last week  pt in range for grip  for her age She cont to have trigger points on flexor digitorum on forearm but smaller area  - to radial side able tolerate graston tool some what - could not tolerate  on mid forearm - still not able to tolerate  semmes weinstein done - see impression for improvement compare to 2-3 wks ago    tightness in forearm flexors into 3rd digit and thumbstillthis date - did get verbal order to do dryneelding5wks ago-PT- Wes done on flexor digitorum 2needles this date- good results - not as tender after wards     Wear wrist splint as much as she can - except ADL's , HEPat home - do not like it  Done and pt to cont with  composite flexor stretch for wrist, forearm and hand to HEP hoold 5 sec - few times Combination nerve glides 10 reps  3 x day Scapula squeezes 10 reps Tendon glides  MEd N glides5 reps        OT Treatments/Exercises (OP) - 11/21/19 0001      Cryotherapy   Number Minutes Cryotherapy  4 Minutes    Cryotherapy Location  Forearm    Type of Cryotherapy  Ice pack             OT Education - 11/21/19 2141    Education provided  Yes    Education Details  HEP review and dryneedling and progress    Person(s) Educated  Patient    Methods  Explanation;Demonstration;Tactile cues;Verbal cues;Handout    Comprehension  Verbal cues required;Returned demonstration;Verbalized understanding          OT Long Term Goals - 09/18/19 1801      OT LONG TERM GOAL #1   Title  Pt report decrease numbness in 2nd thru 4th digits to do buttons , cutting food better    Baseline  trouble with small objects , numbness  2nd to 4th tips    Time  4  Period  Weeks    Status  New    Target Date  10/16/19      OT LONG TERM GOAL #2   Title  Pt show increase 3 point pinch with 3 lbs to cut with knife and fork    Baseline  3 point pinch L 4 lbs , R 11 lbs - cannot hold fork to cut    Time  6    Period  Weeks    Status  New    Target Date  10/30/19            Plan - 11/21/19 2141    Clinical Impression Statement  Pt made since 8 wks ago progress in pain ,  trigger point improving in volar forearm with dryneedling - show improvement to  2.83 testing on Semmes Weinstein on all digits - was 3.61 - Pt negative Tinel - but had positive Phalens after 30 sec - hand went numb - pt to see Dr Jefm Bryant Monday and discuss plan    OT Occupational Profile and History  Problem Focused Assessment - Including review of records relating to presenting problem    Occupational performance deficits (Please refer to evaluation for details):  ADL's;IADL's;Rest and Sleep;Play;Leisure    Body Structure / Function / Physical Skills  ADL;UE functional use;FMC;Strength;Sensation    Rehab Potential  Fair    Clinical Decision Making  Limited treatment options, no task modification necessary    Comorbidities Affecting Occupational Performance:  May have comorbidities impacting occupational performance    Modification or Assistance to Complete Evaluation   No modification of tasks or assist necessary to complete eval    OT Frequency  Biweekly    OT Duration  4 weeks    OT Treatment/Interventions  Self-care/ADL training;Patient/family education;Splinting;Contrast Bath;Manual Therapy;Therapeutic exercise    Plan  assess progress with HEP -assess  dryneedling results and from Dr Jefm Bryant visit    [place]  see pt instruction    Consulted and Agree with Plan of Care  Patient       Patient will benefit from skilled therapeutic intervention in order to improve the following deficits and impairments:   Body Structure /  Function / Physical Skills: ADL, UE functional use, FMC, Strength, Sensation       Visit Diagnosis: Muscle weakness (generalized)  Other disturbances of skin sensation    Problem List Patient Active Problem List   Diagnosis Date Noted  . 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 Rotator cuff arthropathy, right 01/25/2019  . Iron deficiency anemia 09/13/2018  . Anemia, unspecified 09/09/2018  . Acute kidney injury ([place]) 08/23/2018  . HTN, goal below 140/80 06/04/2018  . Hyperthyroidism 02/08/2018  . Stomatitis, ulcerative 09/06/2017  . Dyspareunia in female 08/22/2017  . Vaginal atrophy 08/22/2017  . Obesity (BMI 30.0-34.9) 08/22/2017  . Leg pain, left 04/05/2017  . Peripheral polyneuropathy 04/05/2017  . Lumbar radiculopathy 04/05/2017  . Tremor 02/13/2017  . Sensory neuropathy 02/13/2017  . Restless leg syndrome 02/13/2017  . Acute diarrhea 09/06/2016  . History of pyelonephritis 09/06/2016  . Hypokalemia 09/06/2016  . 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 Pyelonephritis 08/29/2016  . Rheumatoid arthritis, unspecified ([place]) 08/29/2016  . Nontraumatic complete tear of right rotator cuff 03/08/2016  . Acute pain of right shoulder 01/27/2016  . Type 2 diabetes mellitus with diabetic neuropathy ([place]) 10/27/2015  . Crohn's colitis ([place]) 09/04/2015  . Acute bilateral low back pain without sciatica 03/23/2015  . SOB (shortness  of breath) on exertion 01/15/2015  . Pedal edema 01/15/2015  . Status post open reduction with internal fixation of fracture 11/27/2014  . Painful rib 09/11/2014  . Osteoporosis 03/17/2014  . Depression 03/17/2014  . B12 deficiency 03/17/2014  . Crohn's disease, unspecified, without complications ([place]) 17/49/4496  . Urolith 07/16/2013  . Right flank pain 07/16/2013  . Abdominal pain 07/16/2013    Rosalyn Gess OTR/L,CLT 11/21/2019, 9:45 PM  [facility] [address] [place], [address] Phone: [phone]   Fax:  [phone]  Name: VANESS JELINSKI MRN: [id number] Date of Birth: 1951-03-01

## 2019-12-10 ENCOUNTER — Ambulatory Visit: Payer: Medicare Other | Admitting: Occupational Therapy

## 2019-12-26 ENCOUNTER — Other Ambulatory Visit
Admission: RE | Admit: 2019-12-26 | Discharge: 2019-12-26 | Disposition: A | Discharge status: Home / Self Care | Payer: Medicare Other | Source: Ambulatory Visit | Attending: Student | Admitting: Student

## 2019-12-26 DIAGNOSIS — K5090 Crohn's disease, unspecified, without complications: Secondary | ICD-10-CM | POA: Insufficient documentation

## 2019-12-26 LAB — GASTROINTESTINAL PANEL BY PCR, STOOL (REPLACES STOOL CULTURE)

## 2019-12-26 LAB — C DIFFICILE QUICK SCREEN W PCR REFLEX
C Diff antigen: NEGATIVE
C Diff interpretation: NOT DETECTED
C Diff toxin: NEGATIVE

## 2019-12-27 ENCOUNTER — Inpatient Hospital Stay: Payer: Medicare Other | Attending: Oncology

## 2019-12-27 ENCOUNTER — Other Ambulatory Visit: Payer: Self-pay

## 2019-12-27 DIAGNOSIS — Z800 Family history of malignant neoplasm of digestive organs: Secondary | ICD-10-CM | POA: Diagnosis not present

## 2019-12-27 DIAGNOSIS — M79643 Pain in unspecified hand: Secondary | ICD-10-CM | POA: Diagnosis not present

## 2019-12-27 DIAGNOSIS — D509 Iron deficiency anemia, unspecified: Secondary | ICD-10-CM | POA: Diagnosis present

## 2019-12-27 DIAGNOSIS — R7989 Other specified abnormal findings of blood chemistry: Secondary | ICD-10-CM | POA: Insufficient documentation

## 2019-12-27 DIAGNOSIS — G629 Polyneuropathy, unspecified: Secondary | ICD-10-CM | POA: Diagnosis not present

## 2019-12-27 DIAGNOSIS — M79671 Pain in right foot: Secondary | ICD-10-CM | POA: Insufficient documentation

## 2019-12-27 DIAGNOSIS — K5090 Crohn's disease, unspecified, without complications: Secondary | ICD-10-CM | POA: Insufficient documentation

## 2019-12-27 DIAGNOSIS — Z7952 Long term (current) use of systemic steroids: Secondary | ICD-10-CM | POA: Insufficient documentation

## 2019-12-27 DIAGNOSIS — D649 Anemia, unspecified: Secondary | ICD-10-CM

## 2019-12-27 LAB — IRON AND TIBC
Iron: 89 ug/dL (ref 28–170)
Saturation Ratios: 32 % — ABNORMAL HIGH (ref 10.4–31.8)
TIBC: 280 ug/dL (ref 250–450)
UIBC: 191 ug/dL

## 2019-12-27 LAB — FERRITIN: Ferritin: 1290 ng/mL — ABNORMAL HIGH (ref 11–307)

## 2019-12-27 LAB — CBC WITH DIFFERENTIAL/PLATELET
Abs Immature Granulocytes: 0.05 10*3/uL (ref 0.00–0.07)
Basophils Absolute: 0.1 10*3/uL (ref 0.0–0.1)
Basophils Relative: 1 %
Eosinophils Absolute: 0.1 10*3/uL (ref 0.0–0.5)
Eosinophils Relative: 1 %
HCT: 35.6 % — ABNORMAL LOW (ref 36.0–46.0)
Hemoglobin: 11.8 g/dL — ABNORMAL LOW (ref 12.0–15.0)
Immature Granulocytes: 1 %
Lymphocytes Relative: 11 %
Lymphs Abs: 1.1 10*3/uL (ref 0.7–4.0)
MCH: 32.2 pg (ref 26.0–34.0)
MCHC: 33.1 g/dL (ref 30.0–36.0)
MCV: 97.3 fL (ref 80.0–100.0)
Monocytes Absolute: 0.9 10*3/uL (ref 0.1–1.0)
Monocytes Relative: 10 %
Neutro Abs: 7.3 10*3/uL (ref 1.7–7.7)
Neutrophils Relative %: 76 %
Platelets: 255 10*3/uL (ref 150–400)
RBC: 3.66 MIL/uL — ABNORMAL LOW (ref 3.87–5.11)
RDW: 12.5 % (ref 11.5–15.5)
WBC: 9.5 10*3/uL (ref 4.0–10.5)
nRBC: 0 % (ref 0.0–0.2)

## 2019-12-28 LAB — CALPROTECTIN, FECAL: Calprotectin, Fecal: 97 ug/g (ref 0–120)

## 2019-12-29 NOTE — Progress Notes (Signed)
[facility]  [phone] [phone]  ID: Nicole Bass OB: 05-13-1951  MR#: [id number]  [facility]#:[id number]  Patient Care Team: [name], MD as PCP - General (Internal Medicine) Lloyd Huger, MD as Consulting Physician (Oncology)  CHIEF COMPLAINT: Iron deficiency anemia.  INTERVAL HISTORY: Patient returns to clinic today for repeat laboratory, further evaluation, and consideration of IV Feraheme.  She is now on a prednisone taper for Crohn's flare.  Despite this she still has chronic joint pain as well as significant right foot pain and neuropathy in her left hand.  She has an MRI of her cervical spine later this week for further evaluation.  She has no other neurologic complaints.  She denies any recent fevers or illnesses.  She has a fair appetite and denies weight loss.  She denies any chest pain, shortness of breath, cough, or hemoptysis.  She denies any nausea, vomiting, constipation, or diarrhea.  She has no melena or hematochezia.  She has no urinary complaints.  Patient offers no further specific complaints today.  REVIEW OF SYSTEMS:   Review of Systems  Constitutional: Negative.  Negative for fever, malaise/fatigue and weight loss.  Respiratory: Negative.  Negative for cough, hemoptysis and shortness of breath.   Cardiovascular: Negative.  Negative for chest pain and leg swelling.  Gastrointestinal: Negative.  Negative for abdominal pain, blood in stool, nausea and vomiting.  Genitourinary: Negative.  Negative for frequency and hematuria.  Musculoskeletal: Positive for joint pain. Negative for back pain.  Skin: Negative.  Negative for rash.  Neurological: Positive for sensory change. Negative for dizziness, focal weakness, weakness and headaches.  Psychiatric/Behavioral: Negative.  The patient is not nervous/anxious and does not have insomnia.     As per HPI. Otherwise, a complete review of systems is negative.  PAST MEDICAL  HISTORY: Past Medical History:  Diagnosis Date  . Anemia    iron everyday, 3 iron infusions last one August 2020  . Arthritis    rheumatoid  . Collagen vascular disease ([place])   . Crohn's disease ([place])   . Crohn's disease ([place])   . DDD (degenerative disc disease), cervical   . DDD (degenerative disc disease), cervical   . DDD (degenerative disc disease), cervical 2003  . DDD (degenerative disc disease), lumbar   . Degenerative disc disease, cervical   . Dyspnea    out of shape  . GERD (gastroesophageal reflux disease)   . Hand weakness    left hand worse, small tremors  . Hypertension     controlled on meds  . Neuromuscular disorder ([place])   . Neuropathy    legs  . UTI (urinary tract infection)    HO  . Vertigo    can be accompanied by nausea  . Wears dentures    upper dentures    PAST SURGICAL HISTORY: Past Surgical History:  Procedure Laterality Date  . ABDOMINAL HYSTERECTOMY    . APPENDECTOMY    . BACK SURGERY     x 3  . CERVICAL SPINE SURGERY    . [place]  . CHOLECYSTECTOMY    . COLON SURGERY  1999   removed 18 inches of colon,removed appendix  . DILATION AND CURETTAGE OF UTERUS  2004  . ELBOW FRACTURE SURGERY  2008  . JOINT REPLACEMENT Right 2015   partial knee replacement  . KYPHOPLASTY    . KYPHOPLASTY N/A 04/14/2015   Procedure: KYPHOPLASTY;  Surgeon: Hessie Knows, MD;  Location: [facility] ORS;  Service: Orthopedics;  Laterality: N/A;  . KYPHOPLASTY N/A 06/04/2015   Procedure: KYPHOPLASTY L-5;  Surgeon: Hessie Knows, MD;  Location: [facility] ORS;  Service: Orthopedics;  Laterality: N/A;  . MEDIAL PARTIAL KNEE REPLACEMENT    . OOPHORECTOMY Bilateral   . REPAIR EXTENSOR TENDON Left 01/08/2019   Procedure: Realignment  EXTENSOR TENDON;  Surgeon: Hessie Knows, MD;  Location: [facility] ORS;  Service: Orthopedics;  Laterality: Left;  . REVERSE SHOULDER ARTHROPLASTY Right 04/18/2019   Procedure: REVERSE SHOULDER ARTHROPLASTY;  Surgeon: Corky Mull, MD;   Location: [facility] OR[facility];  Service: Orthopedics;  Laterality: Right;  . SHOULDER ARTHROSCOPY WITH ROTATOR CUFF REPAIR AND SUBACROMIAL DECOMPRESSION Right 04/13/2016   Procedure: SHOULDER ARTHROSCOPY WITH ROTATOR CUFF REPAIR AND SUBACROMIAL DECOMPRESSION, release of long head biceps tendon;  Surgeon: Leanor Kail, MD;  Location: [facility] ORS;  Service: Orthopedics;  Laterality: Right;  . TOE SURGERY  2013  . TONSILLECTOMY    . WRIST FRACTURE SURGERY Bilateral     FAMILY HISTORY: Family History  Problem Relation Age of Onset  . Diabetes Mother   . Emphysema Mother   . Emphysema Father   . Colon cancer Maternal Grandfather   . Breast cancer Neg Hx   . Ovarian cancer Neg Hx     ADVANCED DIRECTIVES (Y/N):  N  HEALTH MAINTENANCE: Social History   Tobacco Use  . Smoking status: Never Smoker  . Smokeless tobacco: Never Used  Substance Use Topics  . Alcohol use: No  . Drug use: No     Colonoscopy:  PAP:  Bone density:  Lipid panel:  Allergies  Allergen Reactions  . Ciprofloxacin Itching and Rash  . Levofloxacin Hives and Rash  . Methotrexate Derivatives     Mouth and throat ulcers  . Other Other (See Comments) and Rash    Other reaction(s): Other (See Comments), Unknown Per pts MD she is unable to take this medication because it is contraindicated with her other medications. Per pts MD she is unable to take this medication because it is contraindicated with her other medications.  Other reaction(s): Other (See Comments), Unknown Other reaction(s): Other (See Comments), Unknown Per pts MD she is unable to take this medication because it is contraindicated with her other medications. Per pts MD she is unable to take this medication because it is contraindicated with her other medications. Other reaction(s): Other (See Comments), Unknown Other reaction(s): Other (See Comments), Unknown Per pts MD she is unable to take this medication because it is contraindicated with her   Per pts MD she is unable to take this medication because it is contraindicated with her other medications.  . Sulfa Antibiotics Other (See Comments)    Per pts MD she is unable to take this medication because it is contraindicated with her other medications.  . Cefazolin Itching and Rash  . Cefprozil Rash  . Cephalosporins Itching and Rash  . Enbrel [Etanercept] Itching and Rash  . Humira [Adalimumab] Itching and Rash  . Hydrocodone-Acetaminophen Itching and Other (See Comments)    Itching only  . Lorabid [Loracarbef] Itching and Rash  . Meropenem Itching and Rash  . Oxycodone Itching and Other (See Comments)  . Oxycodone-Acetaminophen Itching  . Primidone Itching and Rash    Current Outpatient Medications  Medication Sig Dispense Refill  . acetaminophen (TYLENOL 8 HOUR ARTHRITIS PAIN) 650 MG CR tablet Take 1,300 mg by mouth every 8 (eight) hours as needed for pain.    Marland Kitchen alendronate (FOSAMAX) 70 MG tablet Take 70 mg  by mouth every Monday. Take with a full glass of water on an empty stomach.     Marland Kitchen amitriptyline (ELAVIL) 10 MG tablet Take 10 mg by mouth at bedtime.    Marland Kitchen aspirin EC 81 MG tablet Take 81 mg by mouth daily.    Marland Kitchen b complex vitamins tablet Take 1 tablet by mouth daily.    . cetirizine (ZYRTEC) 10 MG tablet Take 10 mg by mouth daily.    . cholecalciferol (VITAMIN D3) 25 MCG (1000 UT) tablet Take 1,000 Units by mouth daily.    . cyanocobalamin (,VITAMIN B-12,) 1000 MCG/ML injection Inject 1,000 mcg into the muscle every 30 (thirty) days.    . diazepam (VALIUM) 10 MG tablet Take 1 tablet (10 mg total) by mouth daily as needed (vaginal pain with intercourse, use before sexual activity per vagina). 30 tablet 0  . diphenoxylate-atropine (LOMOTIL) 2.5-0.025 MG per tablet Take 1 tablet by mouth daily. am    . DULoxetine (CYMBALTA) 30 MG capsule Take 30 mg by mouth daily.    . ferrous sulfate 325 (65 FE) MG tablet Take 1 tablet (325 mg total) by mouth 2 (two) times daily with a  meal. (Patient taking differently: Take 325 mg by mouth at bedtime. ) 60 tablet 0  . fluticasone (FLONASE) 50 MCG/ACT nasal spray SMARTSIG:2 Spray(s) Both Nares Every Night    . furosemide (LASIX) 20 MG tablet Take 20 mg by mouth daily. am    . gabapentin (NEURONTIN) 100 MG capsule Take 100 mg in the morning    . gabapentin (NEURONTIN) 400 MG capsule Take 400 mg by mouth at bedtime. 100 mg every morning also    . ketorolac (TORADOL) 10 MG tablet Take 1 tablet (10 mg total) by mouth every 6 (six) hours as needed. 30 tablet 0  . magnesium oxide (MAG-OX) 400 MG tablet Take 400 mg by mouth daily.     . methimazole (TAPAZOLE) 5 MG tablet Take 2.5 mg by mouth every Monday, Wednesday, and Friday.     . methylPREDNISolone (MEDROL DOSEPAK) 4 MG TBPK tablet See admin instructions. follow package directions    . Multiple Vitamin (MULTIVITAMIN WITH MINERALS) TABS tablet Take 1 tablet by mouth daily.    . naproxen (NAPROSYN) 500 MG tablet Take 500 mg by mouth daily.     . nitrofurantoin, macrocrystal-monohydrate, (MACROBID) 100 MG capsule Take 1 capsule (100 mg total) by mouth 2 (two) times daily. 14 capsule 0  . omeprazole (PRILOSEC) 40 MG capsule Take 40 mg by mouth daily before breakfast.     . potassium chloride SA (K-DUR,KLOR-CON) 20 MEQ tablet Take 20 mEq by mouth daily.     . predniSONE (DELTASONE) 1 MG tablet Take 4 mg by mouth daily with breakfast.    . Probiotic Product (PROBIOTIC PO) Take 1 capsule by mouth daily.     Marland Kitchen rOPINIRole (REQUIP) 1 MG tablet Take 1 mg by mouth at bedtime as needed (for restless legs).     . sucralfate (CARAFATE) 1 g tablet Take 0.5 g by mouth 3 (three) times daily with meals as needed (for stomach cramping (crohn's disease)).     Marland Kitchen telmisartan (MICARDIS) 40 MG tablet Take by mouth.    . Tofacitinib Citrate (XELJANZ) 5 MG TABS Take 5 mg by mouth. Take 2 daily    . traMADol (ULTRAM) 50 MG tablet Take 100 mg by mouth 3 (three) times daily as needed for moderate pain.       No current facility-administered medications for  this visit.    OBJECTIVE: Vitals:   12/30/19 1318  BP: (!) 144/74  Pulse: 98  Resp: 18  Temp: (!) 97 F (36.1 C)  SpO2: 99%     Body mass index is 36.2 kg/m.    ECOG FS:0 - Asymptomatic  General: Well-developed, well-nourished, no acute distress. Eyes: Pink conjunctiva, anicteric sclera. HEENT: Normocephalic, moist mucous membranes. Lungs: No audible wheezing or coughing. Heart: Regular rate and rhythm. Abdomen: Soft, nontender, no obvious distention. Musculoskeletal: No edema, cyanosis, or clubbing. Neuro: Alert, answering all questions appropriately. Cranial nerves grossly intact. Skin: No rashes or petechiae noted. Psych: Normal affect.  LAB RESULTS:  Lab Results  Component Value Date   NA 141 04/19/2019   K 3.9 04/19/2019   CL 112 (H) 04/19/2019   CO2 22 04/19/2019   GLUCOSE 194 (H) 04/19/2019   BUN 23 04/19/2019   CREATININE 1.06 (H) 04/19/2019   CALCIUM 8.1 (L) 04/19/2019   PROT 6.6 08/23/2018   ALBUMIN 3.4 (L) 08/23/2018   AST 27 08/23/2018   ALT 20 08/23/2018   ALKPHOS 58 08/23/2018   BILITOT 0.6 08/23/2018   GFRNONAA 55 (L) 04/19/2019   GFRAA >60 04/19/2019    Lab Results  Component Value Date   WBC 9.5 12/27/2019   NEUTROABS 7.3 12/27/2019   HGB 11.8 (L) 12/27/2019   HCT 35.6 (L) 12/27/2019   MCV 97.3 12/27/2019   PLT 255 12/27/2019   Lab Results  Component Value Date   IRON 89 12/27/2019   TIBC 280 12/27/2019   IRONPCTSAT 32 (H) 12/27/2019   Lab Results  Component Value Date   FERRITIN 1,290 (H) 12/27/2019     STUDIES: No results found.  ASSESSMENT: Iron deficiency anemia.  PLAN:   1.  Iron deficiency anemia: Chronic and unchanged.  Patient's hemoglobin is essentially stable at 11.8 and her iron stores continue to be within normal limits.  Ferritin is likely elevated as an acute phase reactant secondary to her Crohn's disease.  She does not require additional IV iron today.   Patient last received 510 mg IV Feraheme on June 18, 2019.  Return to clinic in 6 months with repeat laboratory, further evaluation, and continuation of treatment if needed. 2.  Crohn's disease: Patient currently on prednisone taper.  Continue follow-up and treatment with GI.   3.  Elevated ferritin: Chronic and unchanged.  Secondary to acute phase reactant.  Monitor. 4.  Hand pain/tingling: Patient has a cervical MRI scheduled later this week. 5.  Foot pain: Unclear etiology, continue follow-up with GI and primary care as scheduled.  Patient expressed understanding and was in agreement with this plan. She also understands that She can call clinic at any time with any questions, concerns, or complaints.    Lloyd Huger, MD   12/30/2019 1:58 PM

## 2019-12-30 ENCOUNTER — Other Ambulatory Visit: Payer: Self-pay

## 2019-12-30 ENCOUNTER — Other Ambulatory Visit: Payer: Medicare Other

## 2019-12-30 ENCOUNTER — Encounter: Payer: Self-pay | Admitting: Oncology

## 2019-12-30 ENCOUNTER — Inpatient Hospital Stay: Payer: Medicare Other

## 2019-12-30 ENCOUNTER — Inpatient Hospital Stay (HOSPITAL_BASED_OUTPATIENT_CLINIC_OR_DEPARTMENT_OTHER): Payer: Medicare Other | Admitting: Oncology

## 2019-12-30 VITALS — BP 144/74 | HR 98 | Temp 97.0°F | Resp 18 | Wt 191.6 lb

## 2019-12-30 DIAGNOSIS — D509 Iron deficiency anemia, unspecified: Secondary | ICD-10-CM | POA: Diagnosis not present

## 2019-12-30 NOTE — Progress Notes (Signed)
Pt here for follow up. Having issuew ith numbness and weakness in right hand, is to have cervical MRI on Wednesday

## 2020-01-01 ENCOUNTER — Other Ambulatory Visit: Payer: Self-pay | Admitting: Internal Medicine

## 2020-01-01 DIAGNOSIS — Z1231 Encounter for screening mammogram for malignant neoplasm of breast: Secondary | ICD-10-CM

## 2020-01-28 ENCOUNTER — Other Ambulatory Visit: Payer: Self-pay | Admitting: Neurosurgery

## 2020-01-28 DIAGNOSIS — G8929 Other chronic pain: Secondary | ICD-10-CM

## 2020-01-28 DIAGNOSIS — Z981 Arthrodesis status: Secondary | ICD-10-CM

## 2020-02-03 ENCOUNTER — Ambulatory Visit
Admission: RE | Admit: 2020-02-03 | Discharge: 2020-02-03 | Disposition: A | Discharge status: Home / Self Care | Payer: Medicare Other | Source: Ambulatory Visit | Attending: Neurosurgery | Admitting: Neurosurgery

## 2020-02-03 ENCOUNTER — Other Ambulatory Visit: Payer: Self-pay

## 2020-02-03 DIAGNOSIS — Z981 Arthrodesis status: Secondary | ICD-10-CM | POA: Diagnosis not present

## 2020-02-03 DIAGNOSIS — M545 Low back pain, unspecified: Secondary | ICD-10-CM

## 2020-02-03 DIAGNOSIS — G8929 Other chronic pain: Secondary | ICD-10-CM | POA: Diagnosis present

## 2020-03-20 NOTE — Progress Notes (Signed)
Nicole Bass  [phone] [phone]  ID: [name] OB: Dec 26, 1950  MR#: [id number]  BBC#:[id number]  Patient Care Team: [name], MD as PCP - General (Internal Medicine) Lloyd Huger, MD as Consulting Physician (Oncology)  CHIEF COMPLAINT: Iron deficiency anemia.  INTERVAL HISTORY: Patient returns to clinic today for repeat laboratory work, further evaluation, and consideration of IV Feraheme.  She is having decreased hearing in her left ear and reports she may need cochlear implants in the future.  She also has chronic back pain.  She otherwise feels well.  She has no other neurologic complaints.  She denies any recent fevers or illnesses.  She has a fair appetite and denies weight loss.  She denies any chest pain, shortness of breath, cough, or hemoptysis.  She denies any nausea, vomiting, constipation, or diarrhea.  She has no melena or hematochezia.  She has no urinary complaints.  Patient offers no further specific complaints today.  REVIEW OF SYSTEMS:   Review of Systems  Constitutional: Negative.  Negative for fever, malaise/fatigue and weight loss.  HENT: Positive for hearing loss.   Respiratory: Negative.  Negative for cough, hemoptysis and shortness of breath.   Cardiovascular: Negative.  Negative for chest pain and leg swelling.  Gastrointestinal: Negative.  Negative for abdominal pain, blood in stool, nausea and vomiting.  Genitourinary: Negative.  Negative for frequency and hematuria.  Musculoskeletal: Positive for joint pain. Negative for back pain.  Skin: Negative.  Negative for rash.  Neurological: Positive for sensory change. Negative for dizziness, focal weakness, weakness and headaches.  Psychiatric/Behavioral: Negative.  The patient is not nervous/anxious and does not have insomnia.     As per HPI. Otherwise, a complete review of systems is negative.  PAST MEDICAL HISTORY: Past Medical History:    Diagnosis Date  . Anemia    iron everyday, 3 iron infusions last one August 2020  . Arthritis    rheumatoid  . Collagen vascular disease ([place])   . Crohn's disease ([place])   . Crohn's disease ([place])   . DDD (degenerative disc disease), cervical   . DDD (degenerative disc disease), cervical   . DDD (degenerative disc disease), cervical 2003  . DDD (degenerative disc disease), lumbar   . Degenerative disc disease, cervical   . Dyspnea    out of shape  . GERD (gastroesophageal reflux disease)   . Hand weakness    left hand worse, small tremors  . Hypertension     controlled on meds  . Neuromuscular disorder ([place])   . Neuropathy    legs  . UTI (urinary tract infection)    HO  . Vertigo    can be accompanied by nausea  . Wears dentures    upper dentures    PAST SURGICAL HISTORY: Past Surgical History:  Procedure Laterality Date  . ABDOMINAL HYSTERECTOMY    . APPENDECTOMY    . BACK SURGERY     x 3  . CERVICAL SPINE SURGERY    . Bivalve  . CHOLECYSTECTOMY    . COLON SURGERY  1999   removed 18 inches of colon,removed appendix  . DILATION AND CURETTAGE OF UTERUS  2004  . ELBOW FRACTURE SURGERY  2008  . JOINT REPLACEMENT Right 2015   partial knee replacement  . KYPHOPLASTY    . KYPHOPLASTY N/A 04/14/2015   Procedure: KYPHOPLASTY;  Surgeon: Hessie Knows, MD;  Location: [facility];  Service: Orthopedics;  Laterality: N/A;  .  KYPHOPLASTY N/A 06/04/2015   Procedure: KYPHOPLASTY L-5;  Surgeon: Hessie Knows, MD;  Location: [facility];  Service: Orthopedics;  Laterality: N/A;  . MEDIAL PARTIAL KNEE REPLACEMENT    . OOPHORECTOMY Bilateral   . REPAIR EXTENSOR TENDON Left 01/08/2019   Procedure: Realignment  EXTENSOR TENDON;  Surgeon: Hessie Knows, MD;  Location: [facility];  Service: Orthopedics;  Laterality: Left;  . REVERSE SHOULDER ARTHROPLASTY Right 04/18/2019   Procedure: REVERSE SHOULDER ARTHROPLASTY;  Surgeon: Corky Mull, MD;  Location: [facility];  Service:  Orthopedics;  Laterality: Right;  . SHOULDER ARTHROSCOPY WITH ROTATOR CUFF REPAIR AND SUBACROMIAL DECOMPRESSION Right 04/13/2016   Procedure: SHOULDER ARTHROSCOPY WITH ROTATOR CUFF REPAIR AND SUBACROMIAL DECOMPRESSION, release of long head biceps tendon;  Surgeon: Leanor Kail, MD;  Location: [facility];  Service: Orthopedics;  Laterality: Right;  . TOE SURGERY  2013  . TONSILLECTOMY    . WRIST FRACTURE SURGERY Bilateral     FAMILY HISTORY: Family History  Problem Relation Age of Onset  . Diabetes Mother   . Emphysema Mother   . Emphysema Father   . Colon cancer Maternal Grandfather   . Breast cancer Neg Hx   . Ovarian cancer Neg Hx     ADVANCED DIRECTIVES (Y/N):  N  HEALTH MAINTENANCE: Social History   Tobacco Use  . Smoking status: Never Smoker  . Smokeless tobacco: Never Used  Vaping Use  . Vaping Use: Never used  Substance Use Topics  . Alcohol use: No  . Drug use: No     Colonoscopy:  PAP:  Bone density:  Lipid panel:  Allergies  Allergen Reactions  . Ciprofloxacin Itching and Rash  . Levofloxacin Hives and Rash  . Methotrexate Derivatives     Mouth and throat ulcers  . Other Other (See Comments) and Rash    Other reaction(s): Other (See Comments), Unknown Per pts MD she is unable to take this medication because it is contraindicated with her other medications. Per pts MD she is unable to take this medication because it is contraindicated with her other medications.  Other reaction(s): Other (See Comments), Unknown Other reaction(s): Other (See Comments), Unknown Per pts MD she is unable to take this medication because it is contraindicated with her other medications. Per pts MD she is unable to take this medication because it is contraindicated with her other medications. Other reaction(s): Other (See Comments), Unknown Other reaction(s): Other (See Comments), Unknown Per pts MD she is unable to take this medication because it is contraindicated  with her  Per pts MD she is unable to take this medication because it is contraindicated with her other medications.  . Sulfa Antibiotics Other (See Comments)    Per pts MD she is unable to take this medication because it is contraindicated with her other medications.  . Cefazolin Itching and Rash  . Cefprozil Rash  . Cephalosporins Itching and Rash  . Enbrel [Etanercept] Itching and Rash  . Humira [Adalimumab] Itching and Rash  . Hydrocodone-Acetaminophen Itching and Other (See Comments)    Itching only  . Lorabid [Loracarbef] Itching and Rash  . Meropenem Itching and Rash  . Oxycodone Itching and Other (See Comments)  . Oxycodone-Acetaminophen Itching  . Primidone Itching and Rash    Current Outpatient Medications  Medication Sig Dispense Refill  . acetaminophen (TYLENOL 8 HOUR ARTHRITIS PAIN) 650 MG CR tablet Take 1,300 mg by mouth every 8 (eight) hours as needed for pain.    Marland Kitchen alendronate (FOSAMAX) 70 MG tablet Take  70 mg by mouth every Monday. Take with a full glass of water on an empty stomach.     Marland Kitchen amitriptyline (ELAVIL) 10 MG tablet Take 10 mg by mouth at bedtime.    Marland Kitchen aspirin EC 81 MG tablet Take 81 mg by mouth daily.    Marland Kitchen b complex vitamins tablet Take 1 tablet by mouth daily.    . cetirizine (ZYRTEC) 10 MG tablet Take 10 mg by mouth daily.    . cholecalciferol (VITAMIN D3) 25 MCG (1000 UT) tablet Take 1,000 Units by mouth daily.    . cyanocobalamin (,VITAMIN B-12,) 1000 MCG/ML injection Inject 1,000 mcg into the muscle every 30 (thirty) days.    . diazepam (VALIUM) 10 MG tablet Take 1 tablet (10 mg total) by mouth daily as needed (vaginal pain with intercourse, use before sexual activity per vagina). 30 tablet 0  . diphenoxylate-atropine (LOMOTIL) 2.5-0.025 MG per tablet Take 1 tablet by mouth daily. am    . DULoxetine (CYMBALTA) 30 MG capsule Take 30 mg by mouth daily.    . ferrous sulfate 325 (65 FE) MG tablet Take 1 tablet (325 mg total) by mouth 2 (two) times daily  with a meal. (Patient taking differently: Take 325 mg by mouth at bedtime. ) 60 tablet 0  . fluticasone (FLONASE) 50 MCG/ACT nasal spray SMARTSIG:2 Spray(s) Both Nares Every Night    . furosemide (LASIX) 20 MG tablet Take 20 mg by mouth daily. am    . gabapentin (NEURONTIN) 100 MG capsule Take 100 mg in the morning    . gabapentin (NEURONTIN) 400 MG capsule Take 400 mg by mouth at bedtime. 100 mg every morning also    . ketorolac (TORADOL) 10 MG tablet Take 1 tablet (10 mg total) by mouth every 6 (six) hours as needed. 30 tablet 0  . magnesium oxide (MAG-OX) 400 MG tablet Take 400 mg by mouth daily.     . methimazole (TAPAZOLE) 5 MG tablet Take 2.5 mg by mouth every Monday, Wednesday, and Friday.     . methylPREDNISolone (MEDROL DOSEPAK) 4 MG TBPK tablet See admin instructions. follow package directions    . Multiple Vitamin (MULTIVITAMIN WITH MINERALS) TABS tablet Take 1 tablet by mouth daily.    . naproxen (NAPROSYN) 500 MG tablet Take 500 mg by mouth daily.     . nitrofurantoin, macrocrystal-monohydrate, (MACROBID) 100 MG capsule Take 1 capsule (100 mg total) by mouth 2 (two) times daily. 14 capsule 0  . omeprazole (PRILOSEC) 40 MG capsule Take 40 mg by mouth daily before breakfast.     . potassium chloride SA (K-DUR,KLOR-CON) 20 MEQ tablet Take 20 mEq by mouth daily.     . predniSONE (DELTASONE) 1 MG tablet Take 4 mg by mouth daily with breakfast.    . Probiotic Product (PROBIOTIC PO) Take 1 capsule by mouth daily.     Marland Kitchen rOPINIRole (REQUIP) 1 MG tablet Take 1 mg by mouth at bedtime as needed (for restless legs).     . sucralfate (CARAFATE) 1 g tablet Take 0.5 g by mouth 3 (three) times daily with meals as needed (for stomach cramping (crohn's disease)).     Marland Kitchen telmisartan (MICARDIS) 40 MG tablet Take by mouth.    . Tofacitinib Citrate (XELJANZ) 5 MG TABS Take 5 mg by mouth. Take 2 daily    . traMADol (ULTRAM) 50 MG tablet Take 100 mg by mouth 3 (three) times daily as needed for moderate pain.       No current facility-administered  medications for this visit.    OBJECTIVE: Vitals:   03/26/20 1018  BP: (!) 109/59  Pulse: 92  Resp: 20  Temp: 98.8 F (37.1 C)  SpO2: 95%     Body mass index is 36.01 kg/m.    ECOG FS:0 - Asymptomatic  General: Well-developed, well-nourished, no acute distress. Eyes: Pink conjunctiva, anicteric sclera. HEENT: Normocephalic, moist mucous membranes. Lungs: No audible wheezing or coughing. Heart: Regular rate and rhythm. Abdomen: Soft, nontender, no obvious distention. Musculoskeletal: No edema, cyanosis, or clubbing. Neuro: Alert, answering all questions appropriately. Cranial nerves grossly intact. Skin: No rashes or petechiae noted. Psych: Normal affect.   LAB RESULTS:  Lab Results  Component Value Date   NA 141 04/19/2019   K 3.9 04/19/2019   CL 112 (H) 04/19/2019   CO2 22 04/19/2019   GLUCOSE 194 (H) 04/19/2019   BUN 23 04/19/2019   CREATININE 1.06 (H) 04/19/2019   CALCIUM 8.1 (L) 04/19/2019   PROT 6.6 08/23/2018   ALBUMIN 3.4 (L) 08/23/2018   AST 27 08/23/2018   ALT 20 08/23/2018   ALKPHOS 58 08/23/2018   BILITOT 0.6 08/23/2018   GFRNONAA 55 (L) 04/19/2019   GFRAA >60 04/19/2019    Lab Results  Component Value Date   WBC 6.0 03/26/2020   NEUTROABS 4.2 03/26/2020   HGB 10.3 (L) 03/26/2020   HCT 30.2 (L) 03/26/2020   MCV 95.6 03/26/2020   PLT 223 03/26/2020   Lab Results  Component Value Date   IRON 75 03/26/2020   TIBC 270 03/26/2020   IRONPCTSAT 28 03/26/2020   Lab Results  Component Value Date   FERRITIN 1,163 (H) 03/26/2020     STUDIES: No results found.  ASSESSMENT: Iron deficiency anemia.  PLAN:   1.  Iron deficiency anemia: Although patient's hemoglobin has trended down slightly to 10.3, her iron stores remain within normal limits.  Ferritin remains chronically elevated and this is likely a false elevation secondary to acute phase reactant.  She does not require IV Feraheme today.  Patient last  received 510 mg IV Feraheme on June 18, 2019.  She has been instructed that it is okay to reintroduce iron to her diet.  Return to clinic in 3 months with repeat laboratory work, further evaluation, and continuation of treatment if needed. 2.  Crohn's disease: Patient reports her most recent flare was in May 2021.  Continue follow-up and treatment with GI.   3.  Elevated ferritin: Chronic and unchanged.  Falsely elevated secondary to acute phase reactant. 4.  Back pain: Continue follow-up with primary care as indicated.   Patient expressed understanding and was in agreement with this plan. She also understands that She can call clinic at any time with any questions, concerns, or complaints.    Lloyd Huger, MD   03/26/2020 4:42 PM

## 2020-03-26 ENCOUNTER — Encounter: Payer: Self-pay | Admitting: Oncology

## 2020-03-26 ENCOUNTER — Other Ambulatory Visit: Payer: Self-pay

## 2020-03-26 ENCOUNTER — Inpatient Hospital Stay: Payer: Medicare Other | Attending: Internal Medicine | Admitting: Oncology

## 2020-03-26 ENCOUNTER — Inpatient Hospital Stay: Payer: Medicare Other

## 2020-03-26 VITALS — BP 109/59 | HR 92 | Temp 98.8°F | Resp 20 | Wt 190.6 lb

## 2020-03-26 DIAGNOSIS — D509 Iron deficiency anemia, unspecified: Secondary | ICD-10-CM | POA: Diagnosis not present

## 2020-03-26 DIAGNOSIS — D649 Anemia, unspecified: Secondary | ICD-10-CM

## 2020-03-26 DIAGNOSIS — K5090 Crohn's disease, unspecified, without complications: Secondary | ICD-10-CM | POA: Diagnosis not present

## 2020-03-26 DIAGNOSIS — R7989 Other specified abnormal findings of blood chemistry: Secondary | ICD-10-CM | POA: Diagnosis not present

## 2020-03-26 DIAGNOSIS — M549 Dorsalgia, unspecified: Secondary | ICD-10-CM | POA: Insufficient documentation

## 2020-03-26 LAB — CBC WITH DIFFERENTIAL/PLATELET
Abs Immature Granulocytes: 0.02 10*3/uL (ref 0.00–0.07)
Basophils Absolute: 0 10*3/uL (ref 0.0–0.1)
Basophils Relative: 1 %
Eosinophils Absolute: 0.1 10*3/uL (ref 0.0–0.5)
Eosinophils Relative: 2 %
HCT: 30.2 % — ABNORMAL LOW (ref 36.0–46.0)
Hemoglobin: 10.3 g/dL — ABNORMAL LOW (ref 12.0–15.0)
Immature Granulocytes: 0 %
Lymphocytes Relative: 16 %
Lymphs Abs: 1 10*3/uL (ref 0.7–4.0)
MCH: 32.6 pg (ref 26.0–34.0)
MCHC: 34.1 g/dL (ref 30.0–36.0)
MCV: 95.6 fL (ref 80.0–100.0)
Monocytes Absolute: 0.6 10*3/uL (ref 0.1–1.0)
Monocytes Relative: 11 %
Neutro Abs: 4.2 10*3/uL (ref 1.7–7.7)
Neutrophils Relative %: 70 %
Platelets: 223 10*3/uL (ref 150–400)
RBC: 3.16 MIL/uL — ABNORMAL LOW (ref 3.87–5.11)
RDW: 12.7 % (ref 11.5–15.5)
WBC: 6 10*3/uL (ref 4.0–10.5)
nRBC: 0 % (ref 0.0–0.2)

## 2020-03-26 LAB — IRON AND TIBC
Iron: 75 ug/dL (ref 28–170)
Saturation Ratios: 28 % (ref 10.4–31.8)
TIBC: 270 ug/dL (ref 250–450)
UIBC: 195 ug/dL

## 2020-03-26 LAB — FERRITIN: Ferritin: 1163 ng/mL — ABNORMAL HIGH (ref 11–307)

## 2020-03-26 NOTE — Progress Notes (Signed)
Patient here today for follow up. States she has been having some frequent lower back pain for the past few months. The pain radiates into kidneys and rates pain at 8. She took tramadol before appointment and has a follow up today with her kidney doctor. She would states she does have some balance issues and would like to know if this could be coming from left ear issues or swelling in her left ankle and foot.

## 2020-03-27 ENCOUNTER — Other Ambulatory Visit: Payer: Self-pay

## 2020-03-27 ENCOUNTER — Emergency Department: Payer: Medicare Other

## 2020-03-27 ENCOUNTER — Emergency Department
Admission: EM | Admit: 2020-03-27 | Discharge: 2020-03-27 | Disposition: A | Discharge status: Home / Self Care | Payer: Medicare Other | Attending: Emergency Medicine | Admitting: Emergency Medicine

## 2020-03-27 DIAGNOSIS — S299XXA Unspecified injury of thorax, initial encounter: Secondary | ICD-10-CM | POA: Diagnosis not present

## 2020-03-27 DIAGNOSIS — S20211A Contusion of right front wall of thorax, initial encounter: Secondary | ICD-10-CM | POA: Insufficient documentation

## 2020-03-27 DIAGNOSIS — M069 Rheumatoid arthritis, unspecified: Secondary | ICD-10-CM | POA: Insufficient documentation

## 2020-03-27 DIAGNOSIS — I10 Essential (primary) hypertension: Secondary | ICD-10-CM | POA: Diagnosis not present

## 2020-03-27 DIAGNOSIS — Y9389 Activity, other specified: Secondary | ICD-10-CM | POA: Diagnosis not present

## 2020-03-27 DIAGNOSIS — Y999 Unspecified external cause status: Secondary | ICD-10-CM | POA: Diagnosis not present

## 2020-03-27 DIAGNOSIS — W01198A Fall on same level from slipping, tripping and stumbling with subsequent striking against other object, initial encounter: Secondary | ICD-10-CM | POA: Insufficient documentation

## 2020-03-27 DIAGNOSIS — E119 Type 2 diabetes mellitus without complications: Secondary | ICD-10-CM | POA: Insufficient documentation

## 2020-03-27 DIAGNOSIS — M542 Cervicalgia: Secondary | ICD-10-CM | POA: Insufficient documentation

## 2020-03-27 DIAGNOSIS — Y929 Unspecified place or not applicable: Secondary | ICD-10-CM | POA: Diagnosis not present

## 2020-03-27 DIAGNOSIS — R519 Headache, unspecified: Secondary | ICD-10-CM | POA: Diagnosis not present

## 2020-03-27 DIAGNOSIS — S0282XA Fracture of other specified skull and facial bones, left side, initial encounter for closed fracture: Secondary | ICD-10-CM | POA: Insufficient documentation

## 2020-03-27 DIAGNOSIS — S022XXB Fracture of nasal bones, initial encounter for open fracture: Secondary | ICD-10-CM | POA: Insufficient documentation

## 2020-03-27 DIAGNOSIS — Z7982 Long term (current) use of aspirin: Secondary | ICD-10-CM | POA: Diagnosis not present

## 2020-03-27 DIAGNOSIS — S0121XA Laceration without foreign body of nose, initial encounter: Secondary | ICD-10-CM | POA: Diagnosis present

## 2020-03-27 DIAGNOSIS — S022XXA Fracture of nasal bones, initial encounter for closed fracture: Secondary | ICD-10-CM

## 2020-03-27 DIAGNOSIS — Z79899 Other long term (current) drug therapy: Secondary | ICD-10-CM | POA: Diagnosis not present

## 2020-03-27 DIAGNOSIS — S0230XA Fracture of orbital floor, unspecified side, initial encounter for closed fracture: Secondary | ICD-10-CM

## 2020-03-27 DIAGNOSIS — R911 Solitary pulmonary nodule: Secondary | ICD-10-CM | POA: Insufficient documentation

## 2020-03-27 DIAGNOSIS — Z7984 Long term (current) use of oral hypoglycemic drugs: Secondary | ICD-10-CM | POA: Diagnosis not present

## 2020-03-27 HISTORY — DX: Fracture of nasal bones, initial encounter for closed fracture: S02.2XXA

## 2020-03-27 MED ORDER — KETOROLAC TROMETHAMINE 10 MG PO TABS: 10.0000 mg | ORAL_TABLET | Freq: Four times a day (QID) | ORAL | 0 refills | Status: DC | PRN

## 2020-03-27 MED ORDER — AMOXICILLIN 875 MG PO TABS: 875.0000 mg | ORAL_TABLET | Freq: Two times a day (BID) | ORAL | 0 refills | Status: DC

## 2020-03-27 MED ORDER — MORPHINE SULFATE (PF) 4 MG/ML IV SOLN
4.0000 mg | Freq: Once | INTRAVENOUS | Status: AC
  Administered 2020-03-27: 4 mg via INTRAMUSCULAR
  Filled 2020-03-27: qty 1

## 2020-03-27 MED ORDER — AMOXICILLIN 875 MG PO TABS
875.0000 mg | ORAL_TABLET | Freq: Two times a day (BID) | ORAL | 0 refills | Status: DC
Start: 2020-03-27 — End: 2020-03-27

## 2020-03-27 MED ORDER — TRAMADOL HCL 50 MG PO TABS: 50.0000 mg | ORAL_TABLET | Freq: Four times a day (QID) | ORAL | 0 refills | Status: DC | PRN

## 2020-03-27 MED ORDER — FLUCONAZOLE 150 MG PO TABS
ORAL_TABLET | ORAL | 0 refills | Status: DC
Start: 2020-03-27 — End: 2020-04-09

## 2020-03-27 MED ORDER — TRAMADOL HCL 50 MG PO TABS: 50.0000 mg | ORAL_TABLET | Freq: Once | ORAL | Status: DC

## 2020-03-27 NOTE — Discharge Instructions (Addendum)
Follow-up with [facility] by calling and make an appointment on Monday morning.  Tell them we talked with Dr.Juengel and that you have a nasal bone fracture, septum fracture, and orbital wall fracture.  Take the medication as prescribed.  Return emergency department if worsening

## 2020-03-27 NOTE — ED Provider Notes (Signed)
[facility] Emergency Department Provider Note  ____________________________________________   First MD Initiated Contact with Patient 03/27/20 1531     (approximate)  I have reviewed the triage vital signs and the nursing notes.   HISTORY  Chief Complaint Fall, Facial Swelling, and Laceration    HPI Nicole Bass is a 68 y.o. female presents emergency department after slipping and falling forwards on hardwood floors which resulted in her hitting her face on carpeted steps.  Patient has a large laceration across her nose.  Complaint of headache and neck pain.  Also is now complaining of chest pain as her chest hit the top step.  She thinks it is just sore from the fall.  Unsure if she has had any shortness of breath or not as it hurts more when she wants.  Unsure of last Tdap, denies LOC    Past Medical History:  Diagnosis Date  . Anemia    iron everyday, 3 iron infusions last one August 2020  . Arthritis    rheumatoid  . Collagen vascular disease ([place])   . Crohn's disease ([place])   . Crohn's disease ([place])   . DDD (degenerative disc disease), cervical   . DDD (degenerative disc disease), cervical   . DDD (degenerative disc disease), cervical 2003  . DDD (degenerative disc disease), lumbar   . Degenerative disc disease, cervical   . Dyspnea    out of shape  . GERD (gastroesophageal reflux disease)   . Hand weakness    left hand worse, small tremors  . Hypertension     controlled on meds  . Neuromuscular disorder ([place])   . Neuropathy    legs  . UTI (urinary tract infection)    HO  . Vertigo    can be accompanied by nausea  . Wears dentures    upper dentures    Patient Active Problem List   Diagnosis Date Noted  . 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 Rotator cuff arthropathy, right 01/25/2019  . Iron deficiency anemia 09/13/2018  . Anemia, unspecified 09/09/2018  . Acute kidney injury ([place]) 08/23/2018   . HTN, goal below 140/80 06/04/2018  . Hyperthyroidism 02/08/2018  . Stomatitis, ulcerative 09/06/2017  . Dyspareunia in female 08/22/2017  . Vaginal atrophy 08/22/2017  . Obesity (BMI 30.0-34.9) 08/22/2017  . Leg pain, left 04/05/2017  . Peripheral polyneuropathy 04/05/2017  . Lumbar radiculopathy 04/05/2017  . Tremor 02/13/2017  . Sensory neuropathy 02/13/2017  . Restless leg syndrome 02/13/2017  . Acute diarrhea 09/06/2016  . History of pyelonephritis 09/06/2016  . Hypokalemia 09/06/2016  . 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 Pyelonephritis 08/29/2016  . Rheumatoid arthritis, unspecified ([place]) 08/29/2016  . Nontraumatic complete tear of right rotator cuff 03/08/2016  . Acute pain of right shoulder 01/27/2016  . Type 2 diabetes mellitus with diabetic neuropathy ([place]) 10/27/2015  . Crohn's colitis ([place]) 09/04/2015  . Acute bilateral low back pain without sciatica 03/23/2015  . SOB (shortness of breath) on exertion 01/15/2015  . Pedal edema 01/15/2015  . Status post open reduction with internal fixation of fracture 11/27/2014  . Painful rib 09/11/2014  . Osteoporosis 03/17/2014  . Depression 03/17/2014  . B12 deficiency 03/17/2014  . Crohn's disease, unspecified, without complications ([place]) 32/07/2481  . Urolith 07/16/2013  . Right flank pain 07/16/2013  . Abdominal pain 07/16/2013    Past Surgical History:  Procedure Laterality Date  . ABDOMINAL HYSTERECTOMY    . APPENDECTOMY    .  BACK SURGERY     x 3  . CERVICAL SPINE SURGERY    . [place]  . CHOLECYSTECTOMY    . COLON SURGERY  1999   removed 18 inches of colon,removed appendix  . DILATION AND CURETTAGE OF UTERUS  2004  . ELBOW FRACTURE SURGERY  2008  . JOINT REPLACEMENT Right 2015   partial knee replacement  . KYPHOPLASTY    . KYPHOPLASTY N/A 04/14/2015   Procedure: KYPHOPLASTY;  Surgeon: Hessie Knows, MD;  Location: [facility] ORS;  Service: Orthopedics;  Laterality: N/A;  .  KYPHOPLASTY N/A 06/04/2015   Procedure: KYPHOPLASTY L-5;  Surgeon: Hessie Knows, MD;  Location: [facility] ORS;  Service: Orthopedics;  Laterality: N/A;  . MEDIAL PARTIAL KNEE REPLACEMENT    . OOPHORECTOMY Bilateral   . REPAIR EXTENSOR TENDON Left 01/08/2019   Procedure: Realignment  EXTENSOR TENDON;  Surgeon: Hessie Knows, MD;  Location: [facility] ORS;  Service: Orthopedics;  Laterality: Left;  . REVERSE SHOULDER ARTHROPLASTY Right 04/18/2019   Procedure: REVERSE SHOULDER ARTHROPLASTY;  Surgeon: Corky Mull, MD;  Location: [facility] ORS;  Service: Orthopedics;  Laterality: Right;  . SHOULDER ARTHROSCOPY WITH ROTATOR CUFF REPAIR AND SUBACROMIAL DECOMPRESSION Right 04/13/2016   Procedure: SHOULDER ARTHROSCOPY WITH ROTATOR CUFF REPAIR AND SUBACROMIAL DECOMPRESSION, release of long head biceps tendon;  Surgeon: Leanor Kail, MD;  Location: [facility] ORS;  Service: Orthopedics;  Laterality: Right;  . TOE SURGERY  2013  . TONSILLECTOMY    . WRIST FRACTURE SURGERY Bilateral     Prior to Admission medications   Medication Sig Start Date End Date Taking? Authorizing Provider  acetaminophen (TYLENOL 8 HOUR ARTHRITIS PAIN) 650 MG CR tablet Take 1,300 mg by mouth every 8 (eight) hours as needed for pain.    [provider]  alendronate (FOSAMAX) 70 MG tablet Take 70 mg by mouth every Monday. Take with a full glass of water on an empty stomach.     [provider]  amitriptyline (ELAVIL) 10 MG tablet Take 10 mg by mouth at bedtime. 11/16/18   [provider]  amoxicillin (AMOXIL) 875 MG tablet Take 1 tablet (875 mg total) by mouth 2 (two) times daily. 03/27/20   Caryn Section Linden Dolin, PA-C  aspirin EC 81 MG tablet Take 81 mg by mouth daily.    [provider]  b complex vitamins tablet Take 1 tablet by mouth daily.    [provider]  cetirizine (ZYRTEC) 10 MG tablet Take 10 mg by mouth daily.    [provider]  cholecalciferol (VITAMIN D3) 25 MCG (1000 UT) tablet Take 1,000  Units by mouth daily.    [provider]  cyanocobalamin (,VITAMIN B-12,) 1000 MCG/ML injection Inject 1,000 mcg into the muscle every 30 (thirty) days.    [provider]  diazepam (VALIUM) 10 MG tablet Take 1 tablet (10 mg total) by mouth daily as needed (vaginal pain with intercourse, use before sexual activity per vagina). 02/22/19   Hollice Espy, MD  diphenoxylate-atropine (LOMOTIL) 2.5-0.025 MG per tablet Take 1 tablet by mouth daily. am    [provider]  DULoxetine (CYMBALTA) 30 MG capsule Take 30 mg by mouth daily. 11/26/19   [provider]  ferrous sulfate 325 (65 FE) MG tablet Take 1 tablet (325 mg total) by mouth 2 (two) times daily with a meal. Patient taking differently: Take 325 mg by mouth at bedtime.  08/26/18   Vaughan Basta, MD  fluconazole (DIFLUCAN) 150 MG tablet Take one now  and one in a week 03/27/20   Caryn Section Linden Dolin, PA-C  fluticasone [facility]) 50 MCG/ACT nasal spray SMARTSIG:2 Spray(s) Both Nares Every Night 08/21/19   [provider]  furosemide (LASIX) 20 MG tablet Take 20 mg by mouth daily. am    [provider]  gabapentin (NEURONTIN) 100 MG capsule Take 100 mg in the morning 08/20/19   [provider]  gabapentin (NEURONTIN) 400 MG capsule Take 400 mg by mouth at bedtime. 100 mg every morning also    [provider]  ketorolac (TORADOL) 10 MG tablet Take 1 tablet (10 mg total) by mouth every 6 (six) hours as needed. 03/27/20   Meshia Rau, Linden Dolin, PA-C  magnesium oxide (MAG-OX) 400 MG tablet Take 400 mg by mouth daily.     [provider]  methimazole (TAPAZOLE) 5 MG tablet Take 2.5 mg by mouth every Monday, Wednesday, and Friday.     [provider]  methylPREDNISolone (MEDROL DOSEPAK) 4 MG TBPK tablet See admin instructions. follow package directions 12/12/19   [provider]  Multiple Vitamin (MULTIVITAMIN WITH MINERALS) TABS tablet Take 1 tablet by mouth daily.     [provider]  naproxen (NAPROSYN) 500 MG tablet Take 500 mg by mouth daily.     [provider]  nitrofurantoin, macrocrystal-monohydrate, (MACROBID) 100 MG capsule Take 1 capsule (100 mg total) by mouth 2 (two) times daily. 06/19/19   Lloyd Huger, MD  omeprazole (PRILOSEC) 40 MG capsule Take 40 mg by mouth daily before breakfast.     [provider]  potassium chloride SA (K-DUR,KLOR-CON) 20 MEQ tablet Take 20 mEq by mouth daily.     [provider]  predniSONE (DELTASONE) 1 MG tablet Take 4 mg by mouth daily with breakfast.    [provider]  Probiotic Product (PROBIOTIC PO) Take 1 capsule by mouth daily.     [provider]  rOPINIRole (REQUIP) 1 MG tablet Take 1 mg by mouth at bedtime as needed (for restless legs).     [provider]  sucralfate (CARAFATE) 1 g tablet Take 0.5 g by mouth 3 (three) times daily with meals as needed (for stomach cramping (crohn's disease)).     [provider]  telmisartan (MICARDIS) 40 MG tablet Take by mouth. 08/30/18   [provider]  Tofacitinib Citrate (XELJANZ) 5 MG TABS Take 5 mg by mouth. Take 2 daily    [provider]  traMADol (ULTRAM) 50 MG tablet Take 100 mg by mouth 3 (three) times daily as needed for moderate pain.     [provider]  traMADol (ULTRAM) 50 MG tablet Take 1 tablet (50 mg total) by mouth every 6 (six) hours as needed. 03/27/20   Samanatha Brammer, Linden Dolin, PA-C    Allergies Ciprofloxacin, Levofloxacin, Methotrexate derivatives, Other, Sulfa antibiotics, Cefazolin, Cefprozil, Cephalosporins, Enbrel [etanercept], Humira [adalimumab], Hydrocodone-acetaminophen, Lorabid [loracarbef], Meropenem, Oxycodone, Oxycodone-acetaminophen, and Primidone  Family History  Problem Relation Age of Onset  . Diabetes Mother   . Emphysema Mother   . Emphysema Father   . Colon cancer Maternal Grandfather   . Breast cancer Neg Hx   . Ovarian cancer Neg  Hx     Social History Social History   Tobacco Use  . Smoking status: Never Smoker  . Smokeless tobacco: Never Used  Vaping Use  . Vaping Use: Never used  Substance Use Topics  . Alcohol use: No  . Drug use: No    Review of Systems  Constitutional:  No fever/chills Eyes: No visual changes. ENT: No sore throat.  Positive facial injury Respiratory: Denies cough Cardiovascular: Positive chest pain Gastrointestinal: Denies abdominal pain Genitourinary: Negative for dysuria. Musculoskeletal: Negative for back pain. Skin: Negative for rash.  Positive for laceration across the nose Psychiatric: no mood changes,     ____________________________________________   PHYSICAL EXAM:  VITAL SIGNS: ED Triage Vitals  Enc Vitals Group     BP 03/27/20 1406 121/64     Pulse Rate 03/27/20 1406 89     Resp 03/27/20 1406 20     Temp 03/27/20 1406 98.1 F (36.7 C)     Temp Source 03/27/20 1406 Oral     SpO2 03/27/20 1406 95 %     Weight 03/27/20 1407 191 lb (86.6 kg)     Height 03/27/20 1407 5' 1"  (1.549 m)     Head Circumference --      Peak Flow --      Pain Score 03/27/20 1406 10     Pain Loc --      Pain Edu? --      Excl. in [place]? --     Constitutional: Alert and oriented. Well appearing and in no acute distress. Eyes: Conjunctivae are normal.  Head: Positive bruising around the left eye, laceration across the bridge of the nose Nose: No congestion/rhinnorhea. Mouth/Throat: Mucous membranes are moist.   Neck:  supple no lymphadenopathy noted Cardiovascular: Normal rate, regular rhythm. Heart sounds are normal Respiratory: Normal respiratory effort.  No retractions, lungs c t a, sternum is tender to palpation GU: deferred Musculoskeletal: FROM all extremities, warm and well perfused Neurologic:  Normal speech and language.  Skin:  Skin is warm, dry , positive laceration to the nose. No rash noted. Psychiatric: Mood and affect are normal. Speech and behavior are  normal.  ____________________________________________   LABS (all labs ordered are listed, but only abnormal results are displayed)  Labs Reviewed - No data to display ____________________________________________   ____________________________________________  RADIOLOGY  CT of the head, maxillofacial, and C-spine shows a comminuted nasal bone fracture, infraorbital fracture, and septal fracture, maxillary sinus is filled with blood Chest x-ray is normal  ____________________________________________   PROCEDURES :  ekg see physician read  Procedure(s) performed:   Marland KitchenMarland KitchenLaceration Repair  Date/Time: 03/27/2020 8:30 PM Performed by: Versie Starks, PA-C Authorized by: Versie Starks, PA-C   Consent:    Consent obtained:  Verbal   Consent given by:  Patient   Risks discussed:  Infection, pain, poor cosmetic result and poor wound healing Laceration details:    Location:  Face   Face location:  Nose   Length (cm):  2 Repair type:    Repair type:  Simple Pre-procedure details:    Preparation:  Patient was prepped and draped in usual sterile fashion Exploration:    Hemostasis achieved with:  Direct pressure   Wound extent: underlying fracture     Wound extent: no foreign bodies/material noted and no muscle damage noted   Treatment:    Area cleansed with:  Saline   Amount of cleaning:  Standard   Irrigation solution:  Sterile saline   Irrigation method:  Tap Skin repair:    Repair method:  Steri-Strips and tissue adhesive Approximation:    Approximation:  Close Post-procedure details:    Patient tolerance of procedure:  Tolerated well, no immediate complications      ____________________________________________   INITIAL IMPRESSION / ASSESSMENT AND PLAN / ED COURSE  Pertinent labs & imaging  results that were available during my care of the patient were reviewed by me and considered in my medical decision making (see chart for details).   Patient [age]  45-year-old female presents emergency department after fall while at home.  See HPI  Physical exam shows patient appear well.  Noted facial trauma.  Sternum is tender to palpation.  Remainder of exam is unremarkable  DDx: Nasal bone fracture, open fracture of the nasal bones due to laceration, C-spine fracture, TBI, sternal fracture  CT the head, maxillofacial, and C-spine ordered.  Chest x-ray and EKG ordered   CT shows a comminuted nasal bone fracture bilaterally with right-sided deviation, septal fracture, and infraorbital fracture. Chest x-ray was normal CT of the chest for sternal fracture ordered  I did discuss the findings with Dr. Charolett Bumpers, he states to determine if she has a septal hematoma and if not to antibiotics and follow-up in the office.  Patient will need surgery within the next 14 days.  Findings were discussed with the patient.  She is still complaining of tenderness sternum.  Due to the history of osteoporosis will order a CT of the chest without contrast  CT of the chest did not show any fractures but did show pulmonary nodule.  This was discussed with the patient and she is to follow-up with her regular doctor for repeat chest CT in 3 to 6 months.  See procedure note for laceration repair  Patient has multiple antibiotic allergies.  She was given a prescription for amoxicillin, Diflucan, and Toradol, and tramadol.  She is to call [facility] for an appointment.  Return emergency department worsening.  She states she understands.  She is discharged stable condition.      Nicole Bass was evaluated in Emergency Department on 03/27/2020 for the symptoms described in the history of present illness. She was evaluated in the context of the global COVID-19 pandemic, which necessitated consideration that the patient might be at risk for infection with the SARS-CoV-2 virus that causes COVID-19. Institutional protocols and algorithms that pertain to the evaluation of patients  at risk for COVID-19 are in a state of rapid change based on information released by regulatory bodies including the CDC and federal and state organizations. These policies and algorithms were followed during the patient's care in the ED.    As part of my medical decision making, I reviewed the following data within the [place] History obtained from family, Nursing notes reviewed and incorporated, EKG interpreted NSR but see physician read, Old chart reviewed, Radiograph reviewed , Discussed with radiologist, A consult was requested and obtained from this/these consultant(s) ENT, Notes from prior ED visits and [place] Controlled Substance Database  ____________________________________________   FINAL CLINICAL IMPRESSION(S) / ED DIAGNOSES  Final diagnoses:  Open fracture of nasal bone, initial encounter  Nasal septum fracture, closed, initial encounter  Closed fracture of orbital floor, unspecified laterality, initial encounter ([place])  Contusion of right chest wall, initial encounter  Pulmonary nodule      NEW MEDICATIONS STARTED DURING THIS VISIT:  Discharge Medication List as of 03/27/2020  8:19 PM    START taking these medications   Details  fluconazole (DIFLUCAN) 150 MG tablet Take one now and one in a week, Normal    !! traMADol (ULTRAM) 50 MG tablet Take 1 tablet (50 mg total) by mouth every 6 (six) hours as needed., Starting Fri 03/27/2020, Normal    amoxicillin (AMOXIL) 875 MG tablet Take 1 tablet (875 mg total)  by mouth 2 (two) times daily., Starting Fri 03/27/2020, Print     !! - Potential duplicate medications found. Please discuss with provider.       Note:  This document was prepared using Dragon voice recognition software and may include unintentional dictation errors.    Versie Starks, PA-C 03/27/20 2036    Merlyn Lot, MD 04/06/20 [phone]

## 2020-03-27 NOTE — ED Notes (Signed)
See triage note, pt reports slipping on the step this morning and hitting her face. Takes 81 aspirin daily, denies LOC. Redness/bruising/swelling noted to nose and around left eye.  Ambulatory

## 2020-03-27 NOTE — ED Triage Notes (Signed)
Pt reports carrying groceries and mis stepped and fell forward hitting her face on the step, pt has bruising and swelling to the nose and left face with a laceration to her nose

## 2020-03-30 ENCOUNTER — Ambulatory Visit: Payer: Medicare Other | Admitting: Internal Medicine

## 2020-03-30 ENCOUNTER — Other Ambulatory Visit: Payer: Medicare Other

## 2020-04-01 ENCOUNTER — Other Ambulatory Visit: Payer: Self-pay

## 2020-04-01 ENCOUNTER — Encounter: Payer: Self-pay | Admitting: Otolaryngology

## 2020-04-06 NOTE — Discharge Instructions (Signed)
[place] [facility]  What is Functional Endoscopic Sinus Surgery?  The Surgery involves making the natural openings of the sinuses larger by removing the bony partitions that separate the sinuses from the nasal cavity.  The natural sinus lining is preserved as much as possible to allow the sinuses to resume normal function after the surgery.  In some patients nasal polyps (excessively swollen lining of the sinuses) may be removed to relieve obstruction of the sinus openings.  The surgery is performed through the nose using lighted scopes, which eliminates the need for incisions on the face.  A septoplasty is a different procedure which is sometimes performed with sinus surgery.  It involves straightening the boy partition that separates the two sides of your nose.  A crooked or deviated septum may need repair if is obstructing the sinuses or nasal airflow.  Turbinate reduction is also often performed during sinus surgery.  The turbinates are bony proturberances from the side walls of the nose which swell and can obstruct the nose in patients with sinus and allergy problems.  Their size can be surgically reduced to help relieve nasal obstruction.  What Can Sinus Surgery Do For Me?  Sinus surgery can reduce the frequency of sinus infections requiring antibiotic treatment.  This can provide improvement in nasal congestion, post-nasal drainage, facial pressure and nasal obstruction.  Surgery will NOT prevent you from ever having an infection again, so it usually only for patients who get infections 4 or more times yearly requiring antibiotics, or for infections that do not clear with antibiotics.  It will not cure nasal allergies, so patients with allergies may still require medication to treat their allergies after surgery. Surgery may improve headaches related to sinusitis, however, some people will continue to  require medication to control sinus headaches related to allergies.  Surgery will do nothing for other forms of headache (migraine, tension or cluster).  What Are the Risks of Endoscopic Sinus Surgery?  Current techniques allow surgery to be performed safely with little risk, however, there are rare complications that patients should be aware of.  Because the sinuses are located around the eyes, there is risk of eye injury, including blindness, though again, this would be quite rare. This is usually a result of bleeding behind the eye during surgery, which puts the vision oat risk, though there are treatments to protect the vision and prevent permanent disrupted by surgery causing a leak of the spinal fluid that surrounds the brain.  More serious complications would include bleeding inside the brain cavity or damage to the brain.  Again, all of these complications are uncommon, and spinal fluid leaks can be safely managed surgically if they occur.  The most common complication of sinus surgery is bleeding from the nose, which may require packing or cauterization of the nose.  Continued sinus have polyps may experience recurrence of the polyps requiring revision surgery.  Alterations of sense of smell or injury to the tear ducts are also rare complications.   What is the Surgery Like, and what is the Recovery?  The Surgery usually takes a couple of hours to perform, and is usually performed under a general anesthetic (completely asleep).  Patients are usually discharged home after a couple of hours.  Sometimes during surgery it is necessary to pack the nose to control bleeding, and the packing is left in place for 24 - 48 hours, and removed by your surgeon.  If a septoplasty was performed during the procedure, there is often a splint placed which must be removed after 5-7 days.   Discomfort: Pain is usually mild to moderate, and can be controlled by prescription pain medication or acetaminophen (Tylenol).   Aspirin, Ibuprofen (Advil, Motrin), or Naprosyn (Aleve) should be avoided, as they can cause increased bleeding.  Most patients feel sinus pressure like they have a bad head cold for several days.  Sleeping with your head elevated can help reduce swelling and facial pressure, as can ice packs over the face.  A humidifier may be helpful to keep the mucous and blood from drying in the nose.   Diet: There are no specific diet restrictions, however, you should generally start with clear liquids and a light diet of bland foods because the anesthetic can cause some nausea.  Advance your diet depending on how your stomach feels.  Taking your pain medication with food will often help reduce stomach upset which pain medications can cause.  Nasal Saline Irrigation: It is important to remove blood clots and dried mucous from the nose as it is healing.  This is done by having you irrigate the nose at least 3 - 4 times daily with a salt water solution.  We recommend using NeilMed Sinus Rinse (available at the drug store).  Fill the squeeze bottle with the solution, bend over a sink, and insert the tip of the squeeze bottle into the nose  of an inch.  Point the tip of the squeeze bottle towards the inside corner of the eye on the same side your irrigating.  Squeeze the bottle and gently irrigate the nose.  If you bend forward as you do this, most of the fluid will flow back out of the nose, instead of down your throat.   The solution should be warm, near body temperature, when you irrigate.   Each time you irrigate, you should use a full squeeze bottle.   Note that if you are instructed to use Nasal Steroid Sprays at any time after your surgery, irrigate with saline BEFORE using the steroid spray, so you do not wash it all out of the nose. Another product, Nasal Saline Gel (such as AYR Nasal Saline Gel) can be applied in each nostril 3 - 4 times daily to moisture the nose and reduce scabbing or crusting.  Bleeding:   Bloody drainage from the nose can be expected for several days, and patients are instructed to irrigate their nose frequently with salt water to help remove mucous and blood clots.  The drainage may be dark red or brown, though some fresh blood may be seen intermittently, especially after irrigation.  Do not blow you nose, as bleeding may occur. If you must sneeze, keep your mouth open to allow air to escape through your mouth.  If heavy bleeding occurs: Irrigate the nose with saline to rinse out clots, then spray the nose 3 - 4 times with Afrin Nasal Decongestant Spray.  The spray will constrict the blood vessels to slow bleeding.  Pinch the lower half of your nose shut to apply pressure, and lay down with your head elevated.  Ice packs over the nose may help as well. If bleeding persists despite these measures, you should notify your doctor.  Do not use the Afrin routinely to control nasal congestion after surgery, as it can result in worsening congestion and may affect healing.     Activity: Return to work varies among patients. Most patients will be  out of work at least 5 - 7 days to recover.  Patient may return to work after they are off of narcotic pain medication, and feeling well enough to perform the functions of their job.  Patients must avoid heavy lifting (over 10 pounds) or strenuous physical for 2 weeks after surgery, so your employer may need to assign you to light duty, or keep you out of work longer if light duty is not possible.  NOTE: you should not drive, operate dangerous machinery, do any mentally demanding tasks or make any important legal or financial decisions while on narcotic pain medication and recovering from the general anesthetic.    Call Your Doctor Immediately if You Have Any of the Following: 1. Bleeding that you cannot control with the above measures 2. Loss of vision, double vision, bulging of the eye or black eyes. 3. Fever over 101 degrees 4. Neck stiffness with  severe headache, fever, nausea and change in mental state. You are always encourage to call anytime with concerns, however, please call with requests for pain medication refills during office hours.  Office Endoscopy: During follow-up visits your doctor will remove any packing or splints that may have been placed and evaluate and clean your sinuses endoscopically.  Topical anesthetic will be used to make this as comfortable as possible, though you may want to take your pain medication prior to the visit.  How often this will need to be done varies from patient to patient.  After complete recovery from the surgery, you may need follow-up endoscopy from time to time, particularly if there is concern of recurrent infection or nasal polyps.  General Anesthesia, Adult, Care After This sheet gives you information about how to care for yourself after your procedure. Your health care provider may also give you more specific instructions. If you have problems or questions, contact your health care provider. What can I expect after the procedure? After the procedure, the following side effects are common:  Pain or discomfort at the IV site.  Nausea.  Vomiting.  Sore throat.  Trouble concentrating.  Feeling cold or chills.  Weak or tired.  Sleepiness and fatigue.  Soreness and body aches. These side effects can affect parts of the body that were not involved in surgery. Follow these instructions at home:  For at least 24 hours after the procedure:  Have a responsible adult stay with you. It is important to have someone help care for you until you are awake and alert.  Rest as needed.  Do not: ? Participate in activities in which you could fall or become injured. ? Drive. ? Use heavy machinery. ? Drink alcohol. ? Take sleeping pills or medicines that cause drowsiness. ? Make important decisions or sign legal documents. ? Take care of children on your own. Eating and drinking  Follow  any instructions from your health care provider about eating or drinking restrictions.  When you feel hungry, start by eating small amounts of foods that are soft and easy to digest (bland), such as toast. Gradually return to your regular diet.  Drink enough fluid to keep your urine pale yellow.  If you vomit, rehydrate by drinking water, juice, or clear broth. General instructions  If you have sleep apnea, surgery and certain medicines can increase your risk for breathing problems. Follow instructions from your health care provider about wearing your sleep device: ? Anytime you are sleeping, including during daytime naps. ? While taking prescription pain medicines, sleeping medicines, or medicines that  make you drowsy.  Return to your normal activities as told by your health care provider. Ask your health care provider what activities are safe for you.  Take over-the-counter and prescription medicines only as told by your health care provider.  If you smoke, do not smoke without supervision.  Keep all follow-up visits as told by your health care provider. This is important. Contact a health care provider if:  You have nausea or vomiting that does not get better with medicine.  You cannot eat or drink without vomiting.  You have pain that does not get better with medicine.  You are unable to pass urine.  You develop a skin rash.  You have a fever.  You have redness around your IV site that gets worse. Get help right away if:  You have difficulty breathing.  You have chest pain.  You have blood in your urine or stool, or you vomit blood. Summary  After the procedure, it is common to have a sore throat or nausea. It is also common to feel tired.  Have a responsible adult stay with you for the first 24 hours after general anesthesia. It is important to have someone help care for you until you are awake and alert.  When you feel hungry, start by eating small amounts of  foods that are soft and easy to digest (bland), such as toast. Gradually return to your regular diet.  Drink enough fluid to keep your urine pale yellow.  Return to your normal activities as told by your health care provider. Ask your health care provider what activities are safe for you. This information is not intended to replace advice given to you by your health care provider. Make sure you discuss any questions you have with your health care provider. Document Revised: 08/04/2017 Document Reviewed: 03/17/2017 Elsevier Patient Education  [employer].

## 2020-04-07 ENCOUNTER — Other Ambulatory Visit
Admission: RE | Admit: 2020-04-07 | Discharge: 2020-04-07 | Disposition: A | Discharge status: Home / Self Care | Payer: Medicare Other | Source: Ambulatory Visit | Attending: Otolaryngology | Admitting: Otolaryngology

## 2020-04-07 ENCOUNTER — Other Ambulatory Visit: Payer: Self-pay

## 2020-04-07 DIAGNOSIS — Z01812 Encounter for preprocedural laboratory examination: Secondary | ICD-10-CM | POA: Diagnosis present

## 2020-04-07 DIAGNOSIS — Z20822 Contact with and (suspected) exposure to covid-19: Secondary | ICD-10-CM | POA: Insufficient documentation

## 2020-04-08 ENCOUNTER — Ambulatory Visit: Payer: Medicare Other

## 2020-04-08 LAB — SARS CORONAVIRUS 2 (TAT 6-24 HRS): SARS Coronavirus 2: NEGATIVE

## 2020-04-09 ENCOUNTER — Encounter
Admission: RE | Disposition: A | Discharge status: Home / Self Care | Payer: Self-pay | Source: Home / Self Care | Attending: Otolaryngology

## 2020-04-09 ENCOUNTER — Encounter: Payer: Self-pay | Admitting: Otolaryngology

## 2020-04-09 ENCOUNTER — Other Ambulatory Visit: Payer: Self-pay

## 2020-04-09 ENCOUNTER — Ambulatory Visit
Admission: RE | Admit: 2020-04-09 | Discharge: 2020-04-09 | Disposition: A | Discharge status: Home / Self Care | Payer: Medicare Other | Attending: Otolaryngology | Admitting: Otolaryngology

## 2020-04-09 ENCOUNTER — Ambulatory Visit: Payer: Medicare Other | Admitting: Anesthesiology

## 2020-04-09 DIAGNOSIS — K219 Gastro-esophageal reflux disease without esophagitis: Secondary | ICD-10-CM | POA: Insufficient documentation

## 2020-04-09 DIAGNOSIS — Z79899 Other long term (current) drug therapy: Secondary | ICD-10-CM | POA: Insufficient documentation

## 2020-04-09 DIAGNOSIS — W19XXXA Unspecified fall, initial encounter: Secondary | ICD-10-CM | POA: Insufficient documentation

## 2020-04-09 DIAGNOSIS — I10 Essential (primary) hypertension: Secondary | ICD-10-CM | POA: Insufficient documentation

## 2020-04-09 DIAGNOSIS — G629 Polyneuropathy, unspecified: Secondary | ICD-10-CM | POA: Diagnosis not present

## 2020-04-09 DIAGNOSIS — F419 Anxiety disorder, unspecified: Secondary | ICD-10-CM | POA: Diagnosis not present

## 2020-04-09 DIAGNOSIS — S022XXA Fracture of nasal bones, initial encounter for closed fracture: Secondary | ICD-10-CM | POA: Insufficient documentation

## 2020-04-09 DIAGNOSIS — Z7982 Long term (current) use of aspirin: Secondary | ICD-10-CM | POA: Diagnosis not present

## 2020-04-09 DIAGNOSIS — Z7952 Long term (current) use of systemic steroids: Secondary | ICD-10-CM | POA: Insufficient documentation

## 2020-04-09 DIAGNOSIS — Z6834 Body mass index (BMI) 34.0-34.9, adult: Secondary | ICD-10-CM | POA: Insufficient documentation

## 2020-04-09 DIAGNOSIS — M069 Rheumatoid arthritis, unspecified: Secondary | ICD-10-CM | POA: Insufficient documentation

## 2020-04-09 HISTORY — DX: Polyneuropathy, unspecified: G62.9

## 2020-04-09 HISTORY — DX: Sclerosing mesenteritis: K65.4

## 2020-04-09 HISTORY — DX: Personal history of other mental and behavioral disorders: Z86.59

## 2020-04-09 HISTORY — DX: Motion sickness, initial encounter: T75.3XXA

## 2020-04-09 HISTORY — PX: ORIF NASAL FRACTURE: SHX5359

## 2020-04-09 HISTORY — PX: CLOSED REDUCTION NASAL FRACTURE: SHX5365

## 2020-04-09 HISTORY — DX: Anxiety disorder, unspecified: F41.9

## 2020-04-09 HISTORY — DX: Unspecified hearing loss, unspecified ear: H91.90

## 2020-04-09 HISTORY — DX: Noninfective disorder of lymphatic vessels and lymph nodes, unspecified: I89.9

## 2020-04-09 HISTORY — DX: Headache, unspecified: R51.9

## 2020-04-09 SURGERY — OPEN REDUCTION INTERNAL FIXATION (ORIF) NASAL FRACTURE
Anesthesia: General | Site: Nose

## 2020-04-09 MED ORDER — TRAMADOL HCL 50 MG PO TABS
50.0000 mg | ORAL_TABLET | Freq: Once | ORAL | Status: AC | PRN
  Administered 2020-04-09: 50 mg via ORAL

## 2020-04-09 MED ORDER — LIDOCAINE HCL (CARDIAC) PF 100 MG/5ML IV SOSY
PREFILLED_SYRINGE | INTRAVENOUS | Status: DC | PRN
  Administered 2020-04-09: 50 mg via INTRAVENOUS

## 2020-04-09 MED ORDER — PROPOFOL 10 MG/ML IV BOLUS
INTRAVENOUS | Status: DC | PRN
  Administered 2020-04-09: 100 mg via INTRAVENOUS

## 2020-04-09 MED ORDER — MIDAZOLAM HCL 5 MG/5ML IJ SOLN
INTRAMUSCULAR | Status: DC | PRN
  Administered 2020-04-09: 2 mg via INTRAVENOUS

## 2020-04-09 MED ORDER — FENTANYL CITRATE (PF) 100 MCG/2ML IJ SOLN
25.0000 ug | INTRAMUSCULAR | Status: DC | PRN
  Administered 2020-04-09 (×2): 25 ug via INTRAVENOUS
  Administered 2020-04-09: 50 ug via INTRAVENOUS

## 2020-04-09 MED ORDER — PREDNISONE 10 MG PO TABS: ORAL_TABLET | ORAL | 0 refills | Status: DC

## 2020-04-09 MED ORDER — GLYCOPYRROLATE 0.2 MG/ML IJ SOLN
INTRAMUSCULAR | Status: DC | PRN
  Administered 2020-04-09: .1 mg via INTRAVENOUS

## 2020-04-09 MED ORDER — CLINDAMYCIN HCL 300 MG PO CAPS: 300.0000 mg | ORAL_CAPSULE | Freq: Three times a day (TID) | ORAL | 0 refills | Status: AC

## 2020-04-09 MED ORDER — ACETAMINOPHEN 10 MG/ML IV SOLN: 1000.0000 mg | Freq: Once | INTRAVENOUS | Status: DC

## 2020-04-09 MED ORDER — PHENYLEPHRINE HCL 0.5 % NA SOLN
NASAL | Status: DC | PRN
  Administered 2020-04-09: 15 mL via TOPICAL

## 2020-04-09 MED ORDER — EPHEDRINE SULFATE 50 MG/ML IJ SOLN
INTRAMUSCULAR | Status: DC | PRN
  Administered 2020-04-09: 5 mg via INTRAVENOUS
  Administered 2020-04-09: 10 mg via INTRAVENOUS
  Administered 2020-04-09: 5 mg via INTRAVENOUS

## 2020-04-09 MED ORDER — LACTATED RINGERS IV SOLN: INTRAVENOUS | Status: DC

## 2020-04-09 MED ORDER — LIDOCAINE-EPINEPHRINE 1 %-1:100000 IJ SOLN
INTRAMUSCULAR | Status: DC | PRN
  Administered 2020-04-09: 4 mL

## 2020-04-09 MED ORDER — ONDANSETRON HCL 4 MG/2ML IJ SOLN
INTRAMUSCULAR | Status: DC | PRN
  Administered 2020-04-09: 4 mg via INTRAVENOUS

## 2020-04-09 MED ORDER — SODIUM CHLORIDE 0.9 % IV SOLN
600.0000 mg | Freq: Once | INTRAVENOUS | Status: AC
  Administered 2020-04-09: 600 mg via INTRAVENOUS

## 2020-04-09 MED ORDER — ONDANSETRON HCL 4 MG/2ML IJ SOLN
4.0000 mg | Freq: Once | INTRAMUSCULAR | Status: AC
  Administered 2020-04-09: 4 mg via INTRAVENOUS

## 2020-04-09 MED ORDER — FENTANYL CITRATE (PF) 100 MCG/2ML IJ SOLN
25.0000 ug | INTRAMUSCULAR | Status: AC | PRN
  Administered 2020-04-09 (×4): 25 ug via INTRAVENOUS

## 2020-04-09 MED ORDER — ACETAMINOPHEN 10 MG/ML IV SOLN
1000.0000 mg | Freq: Once | INTRAVENOUS | Status: AC
  Administered 2020-04-09: 1000 mg via INTRAVENOUS

## 2020-04-09 MED ORDER — PHENYLEPHRINE HCL (PRESSORS) 10 MG/ML IV SOLN
INTRAVENOUS | Status: DC | PRN
  Administered 2020-04-09 (×2): 50 ug via INTRAVENOUS
  Administered 2020-04-09: 100 ug via INTRAVENOUS
  Administered 2020-04-09: 50 ug via INTRAVENOUS

## 2020-04-09 MED ORDER — SUCCINYLCHOLINE CHLORIDE 20 MG/ML IJ SOLN
INTRAMUSCULAR | Status: DC | PRN
  Administered 2020-04-09: 80 mg via INTRAVENOUS

## 2020-04-09 MED ORDER — DEXAMETHASONE SODIUM PHOSPHATE 4 MG/ML IJ SOLN
INTRAMUSCULAR | Status: DC | PRN
  Administered 2020-04-09: 10 mg via INTRAVENOUS

## 2020-04-09 MED ORDER — OXYMETAZOLINE HCL 0.05 % NA SOLN
2.0000 | Freq: Once | NASAL | Status: AC
  Administered 2020-04-09: 2 via NASAL

## 2020-04-09 SURGICAL SUPPLY — 22 items
AQUAPLAST 3X3 FLAT (MISCELLANEOUS) ×4
CANISTER SUCT 1200ML W/VALVE (MISCELLANEOUS) ×4 IMPLANT
CLOSURE WOUND 1/2 X4 (GAUZE/BANDAGES/DRESSINGS) ×1
COAGULATOR SUCT 8FR VV (MISCELLANEOUS) ×4 IMPLANT
GAUZE PETROLATUM 1 X8 (GAUZE/BANDAGES/DRESSINGS) IMPLANT
GLOVE PI ULTRA LF STRL 7.5 (GLOVE) ×2 IMPLANT
GLOVE PI ULTRA NON LATEX 7.5 (GLOVE) ×2
KIT TURNOVER KIT A (KITS) ×4 IMPLANT
PACK CUSTOM ENT (PACKS) ×4 IMPLANT
PAD ALCOHOL SWAB (MISCELLANEOUS) ×4 IMPLANT
SOL ANTI-FOG 6CC FOG-OUT (MISCELLANEOUS) ×2 IMPLANT
SOL FOG-OUT ANTI-FOG 6CC (MISCELLANEOUS) ×2
SPLINT AQUAPLAST 3X3 FLAT (MISCELLANEOUS) ×2 IMPLANT
SPLINT NASAL SEPTAL BLV .50 ST (MISCELLANEOUS) ×4 IMPLANT
SPONGE NEURO XRAY DETECT 1X3 (DISPOSABLE) ×4 IMPLANT
STRAP BODY AND KNEE 60X3 (MISCELLANEOUS) ×4 IMPLANT
STRIP CLOSURE SKIN 1/2X4 (GAUZE/BANDAGES/DRESSINGS) ×3 IMPLANT
SUT CHROMIC 3-0 (SUTURE) ×4
SUT CHROMIC 3-0 KS 27XMFL CR (SUTURE) ×2
SUT ETHILON 3-0 KS 30 BLK (SUTURE) ×4 IMPLANT
SUT PLAIN GUT 4-0 (SUTURE) ×8 IMPLANT
SUTURE CHRMC 3-0 KS 27XMFL CR (SUTURE) ×2 IMPLANT

## 2020-04-09 NOTE — Anesthesia Preprocedure Evaluation (Addendum)
Anesthesia Evaluation  Patient identified by MRN, date of birth, ID band Patient awake    Reviewed: NPO status   History of Anesthesia Complications Negative for: history of anesthetic complications  Airway Mallampati: II  TM Distance: >3 FB Neck ROM: full    Dental  (+) Upper Dentures   Pulmonary neg pulmonary ROS,    Pulmonary exam normal        Cardiovascular Exercise Tolerance: Good hypertension, Normal cardiovascular exam     Neuro/Psych Anxiety Neuropathy legs; DDD     GI/Hepatic Neg liver ROS, GERD  Controlled,Crohn's disease    Endo/Other  Morbid obesity (bmi 34)  Renal/GU negative Renal ROS  negative genitourinary   Musculoskeletal  (+) Arthritis , Sternum fx;  R shoulder pain   Abdominal   Peds  Hematology negative hematology ROS (+)   Anesthesia Other Findings S/p fall 8/13 > nose fx and sternum fx > bilat cheek, nose and chest bruising.  X-ray of sternum shows acute non- displaced interior sternal fracture.    Covid: NEG.  Reproductive/Obstetrics                            Anesthesia Physical Anesthesia Plan  ASA: III  Anesthesia Plan: General   Post-op Pain Management:    Induction:   PONV Risk Score and Plan: 2 and Midazolam and Ondansetron  Airway Management Planned:   Additional Equipment:   Intra-op Plan:   Post-operative Plan:   Informed Consent: I have reviewed the patients History and Physical, chart, labs and discussed the procedure including the risks, benefits and alternatives for the proposed anesthesia with the patient or authorized representative who has indicated his/her understanding and acceptance.       Plan Discussed with: CRNA  Anesthesia Plan Comments:         Anesthesia Quick Evaluation

## 2020-04-09 NOTE — Transfer of Care (Signed)
Immediate Anesthesia Transfer of Care Note  Patient: Nicole Bass  Procedure(s) Performed: OPEN REDUCTION INTERNAL FIXATION (ORIF) NASAL FRACTURE (N/A Nose) CLOSED REDUCTION NASAL FRACTURE (N/A )  Patient Location: PACU  Anesthesia Type: General  Level of Consciousness: awake, alert  and patient cooperative  Airway and Oxygen Therapy: Patient Spontanous Breathing and Patient connected to supplemental oxygen  Post-op Assessment: Post-op Vital signs reviewed, Patient's Cardiovascular Status Stable, Respiratory Function Stable, Patent Airway and No signs of Nausea or vomiting  Post-op Vital Signs: Reviewed and stable  Complications: No complications documented.

## 2020-04-09 NOTE — Anesthesia Procedure Notes (Signed)
Procedure Name: Intubation Date/Time: 04/09/2020 10:46 AM Performed by: Mayme Genta, CRNA Pre-anesthesia Checklist: Patient identified, Emergency Drugs available, Suction available, Patient being monitored and Timeout performed Patient Re-evaluated:Patient Re-evaluated prior to induction Oxygen Delivery Method: Circle system utilized Preoxygenation: Pre-oxygenation with 100% oxygen Induction Type: IV induction Ventilation: Mask ventilation without difficulty Laryngoscope Size: Miller and 2 Grade View: Grade III Tube type: Oral Rae Tube size: 7.0 mm Number of attempts: 1 Placement Confirmation: ETT inserted through vocal cords under direct vision,  positive ETCO2 and breath sounds checked- equal and bilateral Tube secured with: Tape Dental Injury: Teeth and Oropharynx as per pre-operative assessment  Comments: Difficult to visualize cords on DL. Grade III view. Boujie passed without difficulty. ETT passed over Boujie without difficulty. +/= BBS.

## 2020-04-09 NOTE — Op Note (Signed)
04/09/2020  11:48 AM    Nicole Bass  [id number]   Pre-Op Dx: Displaced nasal fracture, displaced septal fracture  Post-op Dx: Same  Proc: Open reduction septal fracture, closed reduction nasal fracture  Surg:  Nicole Bass Severin Bou  Anes:  GOT  EBL: 50 mL  Comp: None  Findings: Nasal bones fractured to her right side with inward displacement of the left nasal bone.  Laceration of nasal dorsum healing well.  Her septum was fractured in the cartilage inferiorly and to the bone.  Was pushed off to the left side with significant swelling on the left.   Procedure: Patient was brought to the operating room and placed in the supine position.  She was given general anesthesia by oral endotracheal intubation.  Her nose was prepped with 4 mL of 1% Xylocaine with epi 1:100,000 for infiltration of the nasal septum and slight amount on the nasal dorsum.  Her nose was then filled with cottonoid pledgets soaked in phenylephrine and Xylocaine as per the usual procedure.  She was prepped and draped.  The 0 degree scope was used to visualize nasal passageway the left side was very narrow because the septum was buckled to the left side.  This was deviated posteriorly as well to the left side.  The right side is more open.  Most of the swelling was down on the septum.  A left Killian incision was created with elevation of mucoperichondrium of the left side of the quadrangular plate.  There was a vertical fracture just in front of the bony cartilaginous junction.  There was more horizontal fracture above the maxillary crest.  The mucoperiosteum was elevated over the bone on the left side to show good visualization here.  The mucoperiosteum was elevated over the right side also going through the previous vertical incision just in front of the junction.  The mucosa was elevated over the inferior quadrangular plate or was attached to the maxillary crest.  The crest itself was not fractured.  The cartilage was  lifted off of the crest and removed.  There was a small piece along the inferior border of the quadrangular plate.  The vomer was deviated off the left side and this was fractured and removed as well.  Some of the ethmoid plate was buckled to her left side and some of this was removed and the rest moved back towards the midline.  The quadrangular plate now sit back to the midline but it was still attached anteriorly and superiorly.  The flaps were placed back into their position and now the septum appeared to be straight and in the midline.  I placed a small hole in the mucosal posteriorly on the left side make sure that no blood would collect here and because a posterior septal hematoma.  3 oh chromics were used and through and through sutures to hold the mucosal flaps back in the midline on the septum.  This also anchored the quadrangular plate to the anterior nasal spine.  Multiple sutures were placed here to hold from the septum in position.  A 4-0 plain gut suture was then used to close the [place] incision and to hold the flaps the midline.  This was done through a through and through whipstitch fashion all the way to the back of the septum and superiorly.  Once the septum was sutured in position the 0 degree scope was used to visualize both sides of the airway.  This is more open now on both sides of the  septum in the midline.  Xomed 0.5 mm regular size splints were trimmed and placed on both sides of the septum and held in position with a 3-0 nylon through and through suture.  The airway was open and clear on both sides.  The bridge of the nose washed off with alcohol and some of the scabbing and peeling skin was removed.  The left nasal bone was inward and the right nasal bone was pushed laterally with the trauma.  An elevator was placed underneath the left nasal bone to help push it up and elevated into its normal position and digital pressure was placed on the right nasal bone fracture then back to  the midline.  This now straightened out the nasal dorsum and made it look more symmetrical.  The left nasal bones elevated again to try to make sure that they would stay in their position.  No packing was placed underneath them.  The nose looks symmetric.  Steri-Strips were placed over the nasal dorsum followed by an Aquaplast cast.  The patient was awakened and taken to the recovery room in satisfactory condition. she tolerated the procedure well.  There were no operative complications.\  Dispo:   To PACU to be discharged home  Plan: To rest at home with her head elevated over the weekend.  She can start saline flushes tomorrow.  Will use a prednisone taper starting tomorrow for the next 3 days along with antibiotics for 1 week because of the foreign body in her nose.  She will keep the plastic cast on her nose and not get it wet until we see her back and take it off.  Nicole Bass Nicole Bass  04/09/2020 11:48 AM

## 2020-04-09 NOTE — H&P (Signed)
H&P has been reviewed and patient reevaluated, no changes necessary. To be downloaded later.  

## 2020-04-09 NOTE — Anesthesia Postprocedure Evaluation (Signed)
Anesthesia Post Note  Patient: Nicole Bass  Procedure(s) Performed: OPEN REDUCTION INTERNAL FIXATION (ORIF) NASAL FRACTURE (N/A Nose) CLOSED REDUCTION NASAL FRACTURE (N/A )     Patient location during evaluation: PACU Anesthesia Type: General Level of consciousness: awake and alert Pain management: pain level controlled Vital Signs Assessment: post-procedure vital signs reviewed and stable Respiratory status: spontaneous breathing, nonlabored ventilation, respiratory function stable and patient connected to nasal cannula oxygen Cardiovascular status: blood pressure returned to baseline and stable Postop Assessment: no apparent nausea or vomiting Anesthetic complications: no   No complications documented.  Fidel Levy

## 2020-04-10 ENCOUNTER — Encounter: Payer: Self-pay | Admitting: Otolaryngology

## 2020-04-21 ENCOUNTER — Encounter (INDEPENDENT_AMBULATORY_CARE_PROVIDER_SITE_OTHER): Payer: Medicare Other | Admitting: Vascular Surgery

## 2020-04-27 DIAGNOSIS — I2510 Atherosclerotic heart disease of native coronary artery without angina pectoris: Secondary | ICD-10-CM | POA: Insufficient documentation

## 2020-05-01 DIAGNOSIS — M47812 Spondylosis without myelopathy or radiculopathy, cervical region: Secondary | ICD-10-CM | POA: Insufficient documentation

## 2020-05-27 ENCOUNTER — Encounter
Admission: RE | Admit: 2020-05-27 | Discharge: 2020-05-27 | Disposition: A | Discharge status: Home / Self Care | Payer: Medicare Other | Source: Ambulatory Visit | Attending: Pulmonary Disease | Admitting: Pulmonary Disease

## 2020-05-27 ENCOUNTER — Other Ambulatory Visit: Payer: Self-pay

## 2020-05-27 HISTORY — DX: Personal history of urinary calculi: Z87.442

## 2020-05-27 HISTORY — DX: Meniere's disease, unspecified ear: H81.09

## 2020-05-27 NOTE — Patient Instructions (Addendum)
Your procedure is scheduled on: Monday, October 18 Report to Day Surgery on the 2nd floor of the [facility]. To find out your arrival time, please call [phone] between 1PM - 3PM on: Friday, October 15  REMEMBER: Instructions that are not followed completely may result in serious medical risk, up to and including death; or upon the discretion of your surgeon and anesthesiologist your surgery may need to be rescheduled.  Do not eat food after midnight the night before surgery.  No gum chewing, lozengers or hard candies.  You may however, drink CLEAR liquids up to 2 hours before you are scheduled to arrive for your surgery. Do not drink anything within 2 hours of your scheduled arrival time.  Clear liquids include: - water  - apple juice without pulp - gatorade (not RED) - black coffee or tea (Do NOT add milk or creamers to the coffee or tea) Do NOT drink anything that is not on this list.  TAKE THESE MEDICATIONS THE MORNING OF SURGERY WITH A SIP OF WATER:  1.  Gabapentin 2.  Omeprazole - (take one the night before and one on the morning of surgery - helps to prevent nausea after surgery.) 3.  Tramadol or Tylenol if needed for pain 4.  Prednisone 5.  Diazepam if needed  One week prior to surgery: Stop Anti-inflammatories (NSAIDS) such as Advil, Aleve, Ibuprofen, Motrin, Naproxen, Naprosyn and Aspirin based products such as Excedrin, Goodys Powder, BC Powder. Stop ANY OVER THE COUNTER supplements until after surgery. (You may continue taking Tylenol, Vitamin D, Vitamin B, and multivitamin.)  No Alcohol for 24 hours before or after surgery.  On the morning of surgery brush your teeth with toothpaste and water, you may rinse your mouth with mouthwash if you wish. Do not swallow any toothpaste or mouthwash.  Do not wear jewelry, make-up, hairpins, clips or nail polish.  Do not wear lotions, powders, or perfumes.   Do not shave 48 hours prior to surgery.   Contact lenses,  hearing aids and dentures may not be worn into surgery.  Do not bring valuables to the hospital. [facility] is not responsible for any missing/lost belongings or valuables.   Notify your doctor if there is any change in your medical condition (cold, fever, infection).  Wear comfortable clothing (specific to your surgery type) to the hospital.  Plan for stool softeners for home use; pain medications have a tendency to cause constipation. You can also help prevent constipation by eating foods high in fiber such as fruits and vegetables and drinking plenty of fluids as your diet allows.  After surgery, you can help prevent lung complications by doing breathing exercises.  Take deep breaths and cough every 1-2 hours. Your doctor may order a device called an Incentive Spirometer to help you take deep breaths.  If you are being discharged the day of surgery, you will not be allowed to drive home. You will need a responsible adult (18 years or older) to drive you home and stay with you that night.   If you are taking public transportation, you will need to have a responsible adult (18 years or older) with you. Please confirm with your physician that it is acceptable to use public transportation.   Please call the [place] Dept. at [phone] if you have any questions about these instructions.  Visitation Policy:  Patients undergoing a surgery or procedure may have one family member or support person with them as long as that person  is not COVID-19 positive or experiencing its symptoms.  That person may remain in the waiting area during the procedure.  Masking is required regardless of vaccination status.

## 2020-05-29 ENCOUNTER — Other Ambulatory Visit
Admission: RE | Admit: 2020-05-29 | Discharge: 2020-05-29 | Disposition: A | Discharge status: Home / Self Care | Payer: Medicare Other | Source: Ambulatory Visit | Attending: Pulmonary Disease | Admitting: Pulmonary Disease

## 2020-05-29 ENCOUNTER — Other Ambulatory Visit: Payer: Self-pay

## 2020-05-29 DIAGNOSIS — Z01812 Encounter for preprocedural laboratory examination: Secondary | ICD-10-CM | POA: Insufficient documentation

## 2020-05-29 DIAGNOSIS — Z20822 Contact with and (suspected) exposure to covid-19: Secondary | ICD-10-CM | POA: Insufficient documentation

## 2020-05-29 LAB — APTT: aPTT: 25 seconds (ref 24–36)

## 2020-05-29 LAB — PROTIME-INR
INR: 1 (ref 0.8–1.2)
Prothrombin Time: 12.6 seconds (ref 11.4–15.2)

## 2020-05-29 LAB — POTASSIUM: Potassium: 3.2 mmol/L — ABNORMAL LOW (ref 3.5–5.1)

## 2020-05-29 LAB — SARS CORONAVIRUS 2 (TAT 6-24 HRS): SARS Coronavirus 2: NEGATIVE

## 2020-05-29 NOTE — Progress Notes (Signed)
  [facility] Perioperative Services: Pre-Admission/Anesthesia Testing  Abnormal Lab Notification   Date: 05/29/20  Name: Nicole Bass MRN:   [id number]  Re: Abnormal labs noted during PAT appointment   Provider(s) Notified: Ottie Glazier, MD Notification mode: Routed and/or faxed via [place] LAB VALUE(S): Lab Results  Component Value Date   K 3.2 (L) 05/29/2020   Notes:  Patient on daily loop diuretic therapy (furosemide). She is already on oral K+ supplementation (KDur 20 mEq) that she takes three time a week. Patient is scheduled for a fiberoptic bronchoscopy on 06/01/2017. Will send to surgeon for review. Will also add order to have K+ rechecked on day of procedure. This is a Community education officer; no formal response is required.  Honor Loh, MSN, APRN, FNP-C, CEN [facility]  Peri-operative Services Nurse Practitioner Phone: [phone] 05/29/20 11:56 AM

## 2020-06-01 ENCOUNTER — Other Ambulatory Visit: Payer: Self-pay

## 2020-06-01 ENCOUNTER — Ambulatory Visit: Payer: Medicare Other | Admitting: Urgent Care

## 2020-06-01 ENCOUNTER — Ambulatory Visit
Admission: RE | Admit: 2020-06-01 | Discharge: 2020-06-01 | Disposition: A | Discharge status: Home / Self Care | Payer: Medicare Other | Attending: Pulmonary Disease | Admitting: Pulmonary Disease

## 2020-06-01 ENCOUNTER — Encounter
Admission: RE | Disposition: A | Discharge status: Home / Self Care | Payer: Self-pay | Source: Home / Self Care | Attending: Pulmonary Disease

## 2020-06-01 DIAGNOSIS — K5090 Crohn's disease, unspecified, without complications: Secondary | ICD-10-CM | POA: Insufficient documentation

## 2020-06-01 DIAGNOSIS — G629 Polyneuropathy, unspecified: Secondary | ICD-10-CM | POA: Insufficient documentation

## 2020-06-01 DIAGNOSIS — H8109 Meniere's disease, unspecified ear: Secondary | ICD-10-CM | POA: Diagnosis not present

## 2020-06-01 DIAGNOSIS — M069 Rheumatoid arthritis, unspecified: Secondary | ICD-10-CM | POA: Insufficient documentation

## 2020-06-01 DIAGNOSIS — H9192 Unspecified hearing loss, left ear: Secondary | ICD-10-CM | POA: Diagnosis not present

## 2020-06-01 DIAGNOSIS — Z96611 Presence of right artificial shoulder joint: Secondary | ICD-10-CM | POA: Insufficient documentation

## 2020-06-01 DIAGNOSIS — Z885 Allergy status to narcotic agent status: Secondary | ICD-10-CM | POA: Insufficient documentation

## 2020-06-01 DIAGNOSIS — Z888 Allergy status to other drugs, medicaments and biological substances status: Secondary | ICD-10-CM | POA: Insufficient documentation

## 2020-06-01 DIAGNOSIS — M5136 Other intervertebral disc degeneration, lumbar region: Secondary | ICD-10-CM | POA: Diagnosis not present

## 2020-06-01 DIAGNOSIS — Z87442 Personal history of urinary calculi: Secondary | ICD-10-CM | POA: Insufficient documentation

## 2020-06-01 DIAGNOSIS — E669 Obesity, unspecified: Secondary | ICD-10-CM | POA: Insufficient documentation

## 2020-06-01 DIAGNOSIS — Z79899 Other long term (current) drug therapy: Secondary | ICD-10-CM | POA: Diagnosis not present

## 2020-06-01 DIAGNOSIS — Z882 Allergy status to sulfonamides status: Secondary | ICD-10-CM | POA: Diagnosis not present

## 2020-06-01 DIAGNOSIS — Z90710 Acquired absence of both cervix and uterus: Secondary | ICD-10-CM | POA: Diagnosis not present

## 2020-06-01 DIAGNOSIS — K219 Gastro-esophageal reflux disease without esophagitis: Secondary | ICD-10-CM | POA: Diagnosis not present

## 2020-06-01 DIAGNOSIS — M3589 Other specified systemic involvement of connective tissue: Secondary | ICD-10-CM | POA: Insufficient documentation

## 2020-06-01 DIAGNOSIS — R519 Headache, unspecified: Secondary | ICD-10-CM | POA: Insufficient documentation

## 2020-06-01 DIAGNOSIS — Z6834 Body mass index (BMI) 34.0-34.9, adult: Secondary | ICD-10-CM | POA: Insufficient documentation

## 2020-06-01 DIAGNOSIS — Z9049 Acquired absence of other specified parts of digestive tract: Secondary | ICD-10-CM | POA: Insufficient documentation

## 2020-06-01 DIAGNOSIS — I10 Essential (primary) hypertension: Secondary | ICD-10-CM | POA: Diagnosis not present

## 2020-06-01 DIAGNOSIS — M5030 Other cervical disc degeneration, unspecified cervical region: Secondary | ICD-10-CM | POA: Insufficient documentation

## 2020-06-01 DIAGNOSIS — Z833 Family history of diabetes mellitus: Secondary | ICD-10-CM | POA: Diagnosis not present

## 2020-06-01 DIAGNOSIS — D649 Anemia, unspecified: Secondary | ICD-10-CM | POA: Insufficient documentation

## 2020-06-01 DIAGNOSIS — Z881 Allergy status to other antibiotic agents status: Secondary | ICD-10-CM | POA: Insufficient documentation

## 2020-06-01 DIAGNOSIS — F419 Anxiety disorder, unspecified: Secondary | ICD-10-CM | POA: Diagnosis not present

## 2020-06-01 DIAGNOSIS — R918 Other nonspecific abnormal finding of lung field: Secondary | ICD-10-CM | POA: Diagnosis present

## 2020-06-01 DIAGNOSIS — Z96651 Presence of right artificial knee joint: Secondary | ICD-10-CM | POA: Insufficient documentation

## 2020-06-01 DIAGNOSIS — Z800 Family history of malignant neoplasm of digestive organs: Secondary | ICD-10-CM | POA: Insufficient documentation

## 2020-06-01 DIAGNOSIS — F32A Depression, unspecified: Secondary | ICD-10-CM | POA: Insufficient documentation

## 2020-06-01 DIAGNOSIS — Z825 Family history of asthma and other chronic lower respiratory diseases: Secondary | ICD-10-CM | POA: Insufficient documentation

## 2020-06-01 DIAGNOSIS — R251 Tremor, unspecified: Secondary | ICD-10-CM | POA: Insufficient documentation

## 2020-06-01 HISTORY — PX: FIBEROPTIC BRONCHOSCOPY: SHX5367

## 2020-06-01 LAB — POCT I-STAT, CHEM 8
BUN: 14 mg/dL (ref 8–23)
Calcium, Ion: 1.18 mmol/L (ref 1.15–1.40)
Chloride: 101 mmol/L (ref 98–111)
Creatinine, Ser: 0.9 mg/dL (ref 0.44–1.00)
Glucose, Bld: 96 mg/dL (ref 70–99)
HCT: 38 % (ref 36.0–46.0)
Hemoglobin: 12.9 g/dL (ref 12.0–15.0)
Potassium: 3.5 mmol/L (ref 3.5–5.1)
Sodium: 140 mmol/L (ref 135–145)
TCO2: 26 mmol/L (ref 22–32)

## 2020-06-01 SURGERY — BRONCHOSCOPY, AT BEDSIDE
Anesthesia: General

## 2020-06-01 MED ORDER — ONDANSETRON HCL 4 MG/2ML IJ SOLN: 4.0000 mg | Freq: Once | INTRAMUSCULAR | Status: DC | PRN

## 2020-06-01 MED ORDER — LIDOCAINE HCL (CARDIAC) PF 100 MG/5ML IV SOSY
PREFILLED_SYRINGE | INTRAVENOUS | Status: DC | PRN
  Administered 2020-06-01: 80 mg via INTRAVENOUS

## 2020-06-01 MED ORDER — FENTANYL CITRATE (PF) 100 MCG/2ML IJ SOLN
INTRAMUSCULAR | Status: DC | PRN
  Administered 2020-06-01: 50 ug via INTRAVENOUS

## 2020-06-01 MED ORDER — DEXAMETHASONE SODIUM PHOSPHATE 10 MG/ML IJ SOLN
INTRAMUSCULAR | Status: DC | PRN
  Administered 2020-06-01: 10 mg via INTRAVENOUS

## 2020-06-01 MED ORDER — LIDOCAINE HCL URETHRAL/MUCOSAL 2 % EX GEL
1.0000 "application " | Freq: Once | CUTANEOUS | Status: DC
  Filled 2020-06-01: qty 5

## 2020-06-01 MED ORDER — PROPOFOL 10 MG/ML IV BOLUS
INTRAVENOUS | Status: AC
  Filled 2020-06-01: qty 20

## 2020-06-01 MED ORDER — DEXAMETHASONE SODIUM PHOSPHATE 10 MG/ML IJ SOLN
INTRAMUSCULAR | Status: AC
  Filled 2020-06-01: qty 1

## 2020-06-01 MED ORDER — SUGAMMADEX SODIUM 200 MG/2ML IV SOLN
INTRAVENOUS | Status: DC | PRN
  Administered 2020-06-01: 400 mg via INTRAVENOUS

## 2020-06-01 MED ORDER — MIDAZOLAM HCL 2 MG/2ML IJ SOLN
INTRAMUSCULAR | Status: AC
  Filled 2020-06-01: qty 2

## 2020-06-01 MED ORDER — ONDANSETRON HCL 4 MG/2ML IJ SOLN
INTRAMUSCULAR | Status: AC
  Filled 2020-06-01: qty 2

## 2020-06-01 MED ORDER — FENTANYL CITRATE (PF) 100 MCG/2ML IJ SOLN
INTRAMUSCULAR | Status: AC
  Filled 2020-06-01: qty 2

## 2020-06-01 MED ORDER — FENTANYL CITRATE (PF) 100 MCG/2ML IJ SOLN: 25.0000 ug | INTRAMUSCULAR | Status: DC | PRN

## 2020-06-01 MED ORDER — ROCURONIUM BROMIDE 10 MG/ML (PF) SYRINGE
PREFILLED_SYRINGE | INTRAVENOUS | Status: AC
  Filled 2020-06-01: qty 10

## 2020-06-01 MED ORDER — TRAMADOL HCL 50 MG PO TABS
ORAL_TABLET | ORAL | Status: AC
  Filled 2020-06-01: qty 1

## 2020-06-01 MED ORDER — PHENYLEPHRINE HCL 0.25 % NA SOLN
1.0000 | Freq: Four times a day (QID) | NASAL | Status: DC | PRN
  Filled 2020-06-01: qty 15

## 2020-06-01 MED ORDER — LACTATED RINGERS IV SOLN: INTRAVENOUS | Status: DC

## 2020-06-01 MED ORDER — MIDAZOLAM HCL 2 MG/2ML IJ SOLN
INTRAMUSCULAR | Status: DC | PRN
  Administered 2020-06-01: 2 mg via INTRAVENOUS

## 2020-06-01 MED ORDER — ORAL CARE MOUTH RINSE: 15.0000 mL | Freq: Once | OROMUCOSAL | Status: AC

## 2020-06-01 MED ORDER — PROPOFOL 10 MG/ML IV BOLUS
INTRAVENOUS | Status: DC | PRN
  Administered 2020-06-01: 110 mg via INTRAVENOUS

## 2020-06-01 MED ORDER — BUTAMBEN-TETRACAINE-BENZOCAINE 2-2-14 % EX AERO
1.0000 | INHALATION_SPRAY | Freq: Once | CUTANEOUS | Status: DC
  Filled 2020-06-01: qty 20

## 2020-06-01 MED ORDER — CHLORHEXIDINE GLUCONATE 0.12 % MT SOLN
OROMUCOSAL | Status: AC
  Filled 2020-06-01: qty 15

## 2020-06-01 MED ORDER — ROCURONIUM BROMIDE 100 MG/10ML IV SOLN
INTRAVENOUS | Status: DC | PRN
  Administered 2020-06-01: 50 mg via INTRAVENOUS

## 2020-06-01 MED ORDER — LIDOCAINE HCL (PF) 2 % IJ SOLN
INTRAMUSCULAR | Status: AC
  Filled 2020-06-01: qty 10

## 2020-06-01 MED ORDER — CHLORHEXIDINE GLUCONATE 0.12 % MT SOLN
15.0000 mL | Freq: Once | OROMUCOSAL | Status: AC
  Administered 2020-06-01: 15 mL via OROMUCOSAL

## 2020-06-01 MED ORDER — ONDANSETRON HCL 4 MG/2ML IJ SOLN
INTRAMUSCULAR | Status: DC | PRN
  Administered 2020-06-01: 4 mg via INTRAVENOUS

## 2020-06-01 MED ORDER — LIDOCAINE HCL (PF) 2 % IJ SOLN
INTRAMUSCULAR | Status: AC
  Filled 2020-06-01: qty 5

## 2020-06-01 MED ORDER — SUCCINYLCHOLINE CHLORIDE 200 MG/10ML IV SOSY
PREFILLED_SYRINGE | INTRAVENOUS | Status: AC
  Filled 2020-06-01: qty 10

## 2020-06-01 MED ORDER — TRAMADOL HCL 50 MG PO TABS
50.0000 mg | ORAL_TABLET | Freq: Once | ORAL | Status: AC | PRN
  Administered 2020-06-01: 50 mg via ORAL
  Filled 2020-06-01: qty 1

## 2020-06-01 MED ORDER — LIDOCAINE HCL (PF) 1 % IJ SOLN
30.0000 mL | Freq: Once | INTRAMUSCULAR | Status: DC
  Filled 2020-06-01: qty 30

## 2020-06-01 NOTE — Procedures (Signed)
FLEXIBLE BRONCHOSCOPY PROCEDURE NOTE    Flexible bronchoscopy was performed on 06/01/20 by : Lanney Gins MD  assistance by : Maebelle Munroe and 2)Anesthesia team    Indication for the procedure was :  Pre-procedural H&P. The following assessment was performed on the day of the procedure prior to initiating sedation History:  Chest pain n Dyspnea y Hemoptysis n Cough y Fever n Other pertinent items y  Examination Vital signs -reviewed as per nursing documentation today Cardiac    Murmurs: n  Rubs : n  Gallop: n Lungs Wheezing: n Rales : y Rhonchi :y  Other pertinent findings: n   Pre-procedural assessment for Procedural Sedation included: Depth of sedation: As per anesthesia team  ASA Classification:  2 Mallampati airway assessment: 2    Medication list reviewed: 2yes  The patient's interval history was taken and revealed: no new complaints The pre- procedure physical examination revealed: No new findings Refer to prior clinic note for details.  Informed Consent: Informed consent was obtained from:  patient after explanation of procedure and risks, benefits, as well as alternative procedures available.  Explanation of level of sedation and possible transfusion was also provided.    Procedural Preparation: Time out was performed and patient was identified by name and birthdate and procedure to be performed and side for sampling, if any, was specified. Pt was intubated by anesthesia.  The patient was appropriately draped.  Procedure Findings: Bronchoscope was inserted via ETT  without difficulty.  Posterior oropharynx, epiglottis, arytenoids, false cords and vocal cords were not visualized as these were bypassed by endotracheal tube.   The distal trachea was normal in circumference and appearance without mucosal, cartilaginous or branching abnormalities.  The main carina was non-splayed  .  Media Information   Document Information  Photos    06/01/2020  14:22  Attached To:  Hospital Encounter on 06/01/20  Source Information  Ottie Glazier, MD  [facility]     All right and left lobar airways were visualized to the Sub-segmental level.  Sub- sub segmental carinae were identified in all the distal airways.   Secretions were visible in the following airways and appeared to be stringy phlegm.  The mucosa was : friable with light touch  Airways were notable for:        exophytic lesions :none       extrinsic compression in the following distributions: none .       Friable mucosa: yes        Anthrocotic material /pigmentation: none   Pictorial documentation attached: yes above      Specimens obtained included:   Broncho-alveolar lavage site:blateral   sent for microbiology.                              112m volume infused 655mvolume returned with cellular  appearance       Fluoroscopy Used: none;        Pictorial documentation attached: n      Immediate sampling complications included:none Epinephrine noneml was used topically  The bronchoscopy was terminated due to completion of the planned procedure and the bronchoscope was removed.   Total dosage of Lidocaine was 74m62motal fluoroscopy time was 0 minutes    Estimated Blood loss: none cc.  Complications included:  n  Preliminary CXR findings :  n  Disposition: home   Follow up with Dr. AleLanney Ginsn 5 days for result discussion.   FreClaudette Stapler  MD  [place] Division of Pulmonary & Critical Care Medicine

## 2020-06-01 NOTE — H&P (Signed)
Pulmonary Medicine          Date: 06/01/2020,   MRN# [id number] Nicole Bass 06-27-1951     AdmissionWeight: 86.2 kg                 CurrentWeight: 86.2 kg      CHIEF COMPLAINT:   Granulomatous infiltrate of lung   HISTORY OF PRESENT ILLNESS   Nicole Bass is 69 y.o. female who was referred to [facility] Pulmonary clinic due to abnormal CT chest imaging after fall. She does see cardiology and had stress test done. She had couple small areas of infiltrate on right lung which is punctate and serpentine in shape. She has hx of RA, has been on prednisone chronically For years and weaned to 66m. She has hx of Chrons disease and sclerosing mesenteric fibrosis. She has chronic pain and has degenerative arthritis with plan for PMNR. She had surgical repair of nasal septum and bony structures after mechanical fall with facial injury. She fell due to neuropathy and not feeling her feet and legs as per patients explanation. Review of CT chest with patient we discussed bronchiectatic changes are likely due to long history of chrons a well known sequele of this disorder, she also has RA which can cause lung disease including ILD and rheumatoid nodules. Due to her biologic infusion including xeljans as well as pills of prednisone chronically she does have increased pre-test probability of opportunistic infections and we have discussed this.     PAST MEDICAL HISTORY   Past Medical History:  Diagnosis Date  . Anemia    iron everyday, 3 iron infusions last one August 2020  . Anxiety    nervous  . Arthritis    rheumatoid  . Collagen vascular disease ([place]   . [place]'s [place]   . Crohn's disease ([place]   . DDD (degenerative disc disease), cervical   . DDD (degenerative disc disease), cervical   . DDD (degenerative disc disease), cervical 2003  . DDD (degenerative disc disease), lumbar   . Degenerative disc disease, cervical   . Dyspnea    out of shape  . GERD  (gastroesophageal reflux disease)   . Hand weakness    left hand worse, small tremors  . Headache    with accident  . History of claustrophobia   . History of kidney stones   . HOH (hard of hearing)    left ear  . Hypertension     controlled on meds  . Lymphatic disorder   . Meniere's disease   . Motion sickness   . Nasal fracture 03/27/2020   due to fall  . Neuromuscular disorder ([place]   . Neuropathy    legs  . Peripheral neuropathy   . 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[place]   . UTI (urinary tract infection)    HO  . Vertigo    can be accompanied by nausea  . Wears dentures    upper dentures     SURGICAL HISTORY   Past Surgical History:  Procedure Laterality Date  . ABDOMINAL HYSTERECTOMY    . APPENDECTOMY    . BACK SURGERY     x 3  . CERVICAL SPINE SURGERY     metal plate  . [place]  [place] 2  . CHOLECYSTECTOMY    . CLOSED REDUCTION NASAL FRACTURE N/A 04/09/2020   Procedure: CLOSED REDUCTION NASAL FRACTURE;  Surgeon: JMargaretha Sheffield MD;  [place]  Service: ENT;  Laterality: N/A;  . COLON SURGERY  1999   removed 18 inches of colon,removed appendix  . COLONOSCOPY    . dental implants     3 teeth/ wears dentures  . DILATION AND CURETTAGE OF UTERUS  2004  . ELBOW FRACTURE SURGERY Left 2008  . FRACTURE SURGERY    . JOINT REPLACEMENT Right 2015   partial knee replacement  . KYPHOPLASTY    . KYPHOPLASTY N/A 04/14/2015   Procedure: KYPHOPLASTY;  Surgeon: Hessie Knows, MD;  Location: [facility];  Service: Orthopedics;  Laterality: N/A;  . KYPHOPLASTY N/A 06/04/2015   Procedure: KYPHOPLASTY L-5;  Surgeon: Hessie Knows, MD;  Location: [facility];  Service: Orthopedics;  Laterality: N/A;  . MEDIAL PARTIAL KNEE REPLACEMENT Right   . OOPHORECTOMY Bilateral   . ORIF NASAL FRACTURE N/A 04/09/2020   Procedure: OPEN REDUCTION INTERNAL FIXATION (ORIF) NASAL FRACTURE;  Surgeon: Margaretha Sheffield, MD;  Location: [place];  Service: ENT;   Laterality: N/A;  . REPAIR EXTENSOR TENDON Left 01/08/2019   Procedure: Realignment  EXTENSOR TENDON;  Surgeon: Hessie Knows, MD;  Location: [facility];  Service: Orthopedics;  Laterality: Left;  . REVERSE SHOULDER ARTHROPLASTY Right 04/18/2019   Procedure: REVERSE SHOULDER ARTHROPLASTY;  Surgeon: Corky Mull, MD;  Location: [facility];  Service: Orthopedics;  Laterality: Right;  . SHOULDER ARTHROSCOPY WITH ROTATOR CUFF REPAIR AND SUBACROMIAL DECOMPRESSION Right 04/13/2016   Procedure: SHOULDER ARTHROSCOPY WITH ROTATOR CUFF REPAIR AND SUBACROMIAL DECOMPRESSION, release of long head biceps tendon;  Surgeon: Leanor Kail, MD;  Location: [facility];  Service: Orthopedics;  Laterality: Right;  . TOE SURGERY Right 2013  . TONSILLECTOMY  1995  . UPPER GI ENDOSCOPY    . WRIST FRACTURE SURGERY Bilateral      FAMILY HISTORY   Family History  Problem Relation Age of Onset  . Diabetes Mother   . Emphysema Mother   . Emphysema Father   . Colon cancer Maternal Grandfather   . Breast cancer Neg Hx   . Ovarian cancer Neg Hx      SOCIAL HISTORY   Social History   Tobacco Use  . Smoking status: Never Smoker  . Smokeless tobacco: Never Used  Vaping Use  . Vaping Use: Never used  Substance Use Topics  . Alcohol use: No  . Drug use: No     MEDICATIONS    Home Medication:    Current Medication:  Current Facility-Administered Medications:  .  butamben-tetracaine-benzocaine (CETACAINE) spray 1 spray, 1 spray, Topical, Once, Yasenia Reedy, MD .  chlorhexidine (PERIDEX) 0.12 % solution, , , ,  .  lactated ringers infusion, , Intravenous, Continuous, Chrisopher Pustejovsky, MD, Last Rate: 10 mL/hr at 06/01/20 1406, Continued from Pre-op at 06/01/20 1406 .  lidocaine (PF) (XYLOCAINE) 1 % injection 30 mL, 30 mL, Other, Once, Nalla Purdy, MD .  lidocaine (XYLOCAINE) 2 % jelly 1 application, 1 application, Topical, Once, Nehal Shives, MD .  phenylephrine (NEO-SYNEPHRINE) 0.25 % nasal spray 1  spray, 1 spray, Each Nare, Q6H PRN, Ottie Glazier, MD  Facility-Administered Medications Ordered in Other Encounters:  .  fentaNYL (SUBLIMAZE) injection, , Intravenous, Anesthesia Intra-op, Hedda Slade, CRNA, 50 mcg at 06/01/20 1412 .  lidocaine (cardiac) 100 mg/7m (XYLOCAINE) injection 2%, , Intravenous, Anesthesia Intra-op, Starr, Deana, CRNA, 80 mg at 06/01/20 1412 .  midazolam (VERSED) injection, , Intravenous, Anesthesia Intra-op, Starr, Deana, CRNA, 2 mg at 06/01/20 1407    ALLERGIES   Ciprofloxacin, Levofloxacin, Methotrexate derivatives, Other, Sulfa antibiotics, Cefazolin, Cefprozil, Cephalosporins, Enbrel [etanercept],  Humira [adalimumab], Hydrocodone-acetaminophen, Lorabid [loracarbef], Meropenem, Oxycodone, Oxycodone-acetaminophen, and Primidone     REVIEW OF SYSTEMS    Review of Systems:  Gen:  Denies  fever, sweats, chills weigh loss  HEENT: Denies blurred vision, double vision, ear pain, eye pain, hearing loss, nose bleeds, sore throat Cardiac:  No dizziness, chest pain or heaviness, chest tightness,edema Resp:   Denies cough or sputum porduction, shortness of breath,wheezing, hemoptysis,  Gi: Denies swallowing difficulty, stomach pain, nausea or vomiting, diarrhea, constipation, bowel incontinence Gu:  Denies bladder incontinence, burning urine Ext:   Denies Joint pain, stiffness or swelling Skin: Denies  skin rash, easy bruising or bleeding or hives Endoc:  Denies polyuria, polydipsia , polyphagia or weight change Psych:   Denies depression, insomnia or hallucinations   Other:  All other systems negative   VS: BP 130/76   Pulse 89   Temp (!) 97.2 F (36.2 C) (Tympanic)   Resp 16   Ht _0  (1.575 m)   Wt 86.2 kg   SpO2 97%   BMI 34.75 kg/m      PHYSICAL EXAM    GENERAL:NAD, no fevers, chills, no weakness no fatigue HEAD: Normocephalic, atraumatic.  EYES: Pupils equal, round, reactive to light. Extraocular muscles intact. No scleral icterus.   MOUTH: Moist mucosal membrane. Dentition intact. No abscess noted.  EAR, NOSE, THROAT: Clear without exudates. No external lesions.  NECK: Supple. No thyromegaly. No nodules. No JVD.  PULMONARY: clear to ausculatattion bilaterally  CARDIOVASCULAR: S1 and S2. Regular rate and rhythm. No murmurs, rubs, or gallops. No edema. Pedal pulses 2+ bilaterally.  GASTROINTESTINAL: Soft, nontender, nondistended. No masses. Positive bowel sounds. No hepatosplenomegaly.  MUSCULOSKELETAL: No swelling, clubbing, or edema. Range of motion full in all extremities.  NEUROLOGIC: Cranial nerves II through XII are intact. No gross focal neurological deficits. Sensation intact. Reflexes intact.  SKIN: No ulceration, lesions, rashes, or cyanosis. Skin warm and dry. Turgor intact.  PSYCHIATRIC: Mood, affect within normal limits. The patient is awake, alert and oriented x 3. Insight, judgment intact.       IMAGING    No results found.    ASSESSMENT/PLAN   Right lung infiltrate in immunocompromised patient - concern for possible opportunistic infection -reviewed with patient -will perform bilateral airway inspection and BAL for microbiology today  -Reviewed risks/complications and benefits with patient, risks include infection, pneumothorax/pneumomediastinum which may require chest tube placement as well as overnight/prolonged hospitalization and possible mechanical ventilation. Other risks include bleeding and very rarely death.  Patient understands risks and wishes to proceed.  Additional questions were answered, and patient is aware that post procedure patient will be going home with family and may experience cough with possible clots on expectoration as well as phlegm which may last few days as well as hoarseness of voice post intubation and mechanical ventilation. -patient examined prior to procedure and additional questions answered.     Thank you for allowing me to participate in the care of this  patient.    This document was prepared using Dragon voice recognition software and may include unintentional dictation errors.     Ottie Glazier, M.D.  Division of [place]

## 2020-06-01 NOTE — Transfer of Care (Signed)
Immediate Anesthesia Transfer of Care Note  Patient: Nicole Bass  Procedure(s) Performed: BEDSIDE BRONCHOSCOPY FIBEROPTIC (N/A )  Patient Location: PACU  Anesthesia Type:General  Level of Consciousness: awake, alert  and oriented  Airway & Oxygen Therapy: Patient Spontanous Breathing and Patient connected to face mask oxygen  Post-op Assessment: Report given to RN and Post -op Vital signs reviewed and stable  Post vital signs: Reviewed and stable  Last Vitals:  Vitals Value Taken Time  BP 113/83 06/01/20 1435  Temp 36.1 C 06/01/20 1435  Pulse 92 06/01/20 1436  Resp 15 06/01/20 1436  SpO2 100 % 06/01/20 1436  Vitals shown include unvalidated device data.  Last Pain:  Vitals:   06/01/20 1317  TempSrc: Tympanic  PainSc: 2          Complications: No complications documented.

## 2020-06-01 NOTE — Discharge Instructions (Signed)

## 2020-06-01 NOTE — Anesthesia Procedure Notes (Signed)
Procedure Name: Intubation Date/Time: 06/01/2020 2:18 PM Performed by: Hedda Slade, CRNA Pre-anesthesia Checklist: Patient identified, Patient being monitored, Timeout performed, Emergency Drugs available and Suction available Patient Re-evaluated:Patient Re-evaluated prior to induction Oxygen Delivery Method: Circle system utilized Preoxygenation: Pre-oxygenation with 100% oxygen Induction Type: IV induction Ventilation: Mask ventilation without difficulty and Oral airway inserted - appropriate to patient size Laryngoscope Size: 3 and McGraph Grade View: Grade I Tube type: Oral Tube size: 8.5 mm Number of attempts: 1 Airway Equipment and Method: Stylet and Video-laryngoscopy Placement Confirmation: ETT inserted through vocal cords under direct vision,  positive ETCO2 and breath sounds checked- equal and bilateral Secured at: 21 cm Tube secured with: Tape Dental Injury: Teeth and Oropharynx as per pre-operative assessment

## 2020-06-01 NOTE — Anesthesia Preprocedure Evaluation (Signed)
Anesthesia Evaluation  Patient identified by MRN, date of birth, ID band Patient awake    Reviewed: Allergy & Precautions, NPO status , Patient's Chart, lab work & pertinent test results  History of Anesthesia Complications Negative for: history of anesthetic complications  Airway Mallampati: III  TM Distance: >3 FB Neck ROM: Full    Dental  (+) Upper Dentures   Pulmonary neg pulmonary ROS, neg sleep apnea, neg COPD,    breath sounds clear to auscultation- rhonchi (-) wheezing      Cardiovascular hypertension, Pt. on medications (-) CAD, (-) Past MI, (-) Cardiac Stents and (-) CABG  Rhythm:Regular Rate:Normal - Systolic murmurs and - Diastolic murmurs    Neuro/Psych  Headaches, neg Seizures PSYCHIATRIC DISORDERS Anxiety Depression    GI/Hepatic Neg liver ROS, GERD  ,  Endo/Other  neg diabetes  Renal/GU negative Renal ROS     Musculoskeletal  (+) Arthritis ,   Abdominal (+) + obese,   Peds  Hematology  (+) anemia ,   Anesthesia Other Findings Past Medical History: No date: Anemia     Comment:  iron everyday, 3 iron infusions last one August 2020 No date: Anxiety     Comment:  nervous No date: Arthritis     Comment:  rheumatoid No date: Collagen vascular disease (HCC) No date: Crohn's disease (HCC) No date: Crohn's disease (HCC) No date: DDD (degenerative disc disease), cervical No date: DDD (degenerative disc disease), cervical 2003: DDD (degenerative disc disease), cervical No date: DDD (degenerative disc disease), lumbar No date: Degenerative disc disease, cervical No date: Dyspnea     Comment:  out of shape No date: GERD (gastroesophageal reflux disease) No date: Hand weakness     Comment:  left hand worse, small tremors No date: Headache     Comment:  with accident No date: History of claustrophobia No date: History of kidney stones No date: HOH (hard of hearing)     Comment:  left ear No date:  Hypertension     Comment:   controlled on meds No date: Lymphatic disorder No date: Meniere's disease No date: Motion sickness 03/27/2020: Nasal fracture     Comment:  due to fall No date: Neuromuscular disorder ([place]) No date: Neuropathy     Comment:  legs No date: Peripheral neuropathy No date: 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[place]) No date: UTI (urinary tract infection)     Comment:  HO No date: Vertigo     Comment:  can be accompanied by nausea No date: Wears dentures     Comment:  upper dentures   Reproductive/Obstetrics                             Anesthesia Physical Anesthesia Plan  ASA: III  Anesthesia Plan: General   Post-op Pain Management:    Induction: Intravenous  PONV Risk Score and Plan: 2 and Ondansetron and Dexamethasone  Airway Management Planned: Oral ETT  Additional Equipment:   Intra-op Plan:   Post-operative Plan: Extubation in OR  Informed Consent: I have reviewed the patients History and Physical, chart, labs and discussed the procedure including the risks, benefits and alternatives for the proposed anesthesia with the patient or authorized representative who has indicated his/her understanding and acceptance.     Dental advisory given  Plan Discussed with: CRNA and Anesthesiologist  Anesthesia Plan Comments:         Anesthesia Quick Evaluation

## 2020-06-01 NOTE — Anesthesia Postprocedure Evaluation (Signed)
Anesthesia Post Note  Patient: Nicole Bass  Procedure(s) Performed: BEDSIDE BRONCHOSCOPY FIBEROPTIC (N/A )  Patient location during evaluation: PACU Anesthesia Type: General Level of consciousness: awake and alert and oriented Pain management: pain level controlled Vital Signs Assessment: post-procedure vital signs reviewed and stable Respiratory status: spontaneous breathing, nonlabored ventilation and respiratory function stable Cardiovascular status: blood pressure returned to baseline and stable Postop Assessment: no signs of nausea or vomiting Anesthetic complications: no   No complications documented.   Last Vitals:  Vitals:   06/01/20 1519 06/01/20 1529  BP:  117/60  Pulse:  87  Resp:  20  Temp:  (!) 36.3 C  SpO2: 95% 94%    Last Pain:  Vitals:   06/01/20 1529  TempSrc: Temporal  PainSc: 8                  Jaunita Mikels

## 2020-06-02 ENCOUNTER — Encounter: Payer: Self-pay | Admitting: Pulmonary Disease

## 2020-06-02 ENCOUNTER — Ambulatory Visit (INDEPENDENT_AMBULATORY_CARE_PROVIDER_SITE_OTHER): Payer: Medicare Other | Admitting: Vascular Surgery

## 2020-06-02 VITALS — BP 132/75 | HR 87 | Resp 16 | Ht 62.0 in | Wt 186.4 lb

## 2020-06-02 DIAGNOSIS — I10 Essential (primary) hypertension: Secondary | ICD-10-CM

## 2020-06-02 DIAGNOSIS — E1140 Type 2 diabetes mellitus with diabetic neuropathy, unspecified: Secondary | ICD-10-CM

## 2020-06-02 DIAGNOSIS — M7989 Other specified soft tissue disorders: Secondary | ICD-10-CM

## 2020-06-02 DIAGNOSIS — M79605 Pain in left leg: Secondary | ICD-10-CM

## 2020-06-02 LAB — KOH PREP: KOH Prep: NONE SEEN

## 2020-06-02 NOTE — Assessment & Plan Note (Signed)
blood glucose control important in reducing the progression of atherosclerotic disease. Also, involved in wound healing. On appropriate medications.  Would be suspicious that a component of neuropathy is a major contributing factor as well.

## 2020-06-02 NOTE — Progress Notes (Signed)
Patient ID: Nicole Bass, female   DOB: 1950-09-25, 69 y.o.   MRN: [id number]  Chief Complaint  Patient presents with  . New Patient (Initial Visit)    ref Doy Hutching le edema    HPI Nicole Bass is a 69 y.o. female.  I am asked to see the patient by Dr. Doy Hutching for evaluation of leg swelling.  This has been an on and off problem for months to years.  This got much worse with a beach trip at the end of September.  It is a little better today.  The left leg is more severely affected the 2 legs, but there is swelling in both legs.  This associated with significant pain.  She does have some neuropathy as well on her feet and lower legs.  She also has some hammertoe issues in the right foot.  She was apparently worked up several years ago and told she had leaky veins but did not proceed with treatment for this at that time.  Her symptoms have progressed since then.  No open wounds or infection.  No fevers or chills.  No chest pain or shortness of breath.  No known history of DVT or superficial thrombophlebitis   Past Medical History:  Diagnosis Date  . Anemia    iron everyday, 3 iron infusions last one August 2020  . Anxiety    nervous  . Arthritis    rheumatoid  . Collagen vascular disease ([place])   . Crohn's disease ([place])   . Crohn's disease ([place])   . DDD (degenerative disc disease), cervical   . DDD (degenerative disc disease), cervical   . DDD (degenerative disc disease), cervical 2003  . DDD (degenerative disc disease), lumbar   . Degenerative disc disease, cervical   . Dyspnea    out of shape  . GERD (gastroesophageal reflux disease)   . Hand weakness    left hand worse, small tremors  . Headache    with accident  . History of claustrophobia   . History of kidney stones   . HOH (hard of hearing)    left ear  . Hypertension     controlled on meds  . Lymphatic disorder   . Meniere's disease   . Motion sickness   . Nasal fracture 03/27/2020   due to fall  .  Neuromuscular disorder ([place])   . Neuropathy    legs  . Peripheral neuropathy   . 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 UTI (urinary tract infection)    HO  . Vertigo    can be accompanied by nausea  . Wears dentures    upper dentures    Past Surgical History:  Procedure Laterality Date  . ABDOMINAL HYSTERECTOMY    . APPENDECTOMY    . BACK SURGERY     x 3  . CERVICAL SPINE SURGERY     metal plate  . [place]   X 2  . CHOLECYSTECTOMY    . CLOSED REDUCTION NASAL FRACTURE N/A 04/09/2020   Procedure: CLOSED REDUCTION NASAL FRACTURE;  Surgeon: Margaretha Sheffield, MD;  Location: [facility];  Service: ENT;  Laterality: N/A;  . [place]   removed 18 inches of colon,removed appendix  . COLONOSCOPY    . dental implants     3 teeth/ wears dentures  . DILATION AND CURETTAGE OF UTERUS  2004  . ELBOW FRACTURE SURGERY Left 2008  . FIBEROPTIC BRONCHOSCOPY N/A 06/01/2020  Procedure: BEDSIDE BRONCHOSCOPY FIBEROPTIC;  Surgeon: Ottie Glazier, MD;  Location: [facility];  Service: Thoracic;  Laterality: N/A;  . FRACTURE SURGERY    . JOINT REPLACEMENT Right 2015   partial knee replacement  . KYPHOPLASTY    . KYPHOPLASTY N/A 04/14/2015   Procedure: KYPHOPLASTY;  Surgeon: Hessie Knows, MD;  Location: [facility];  Service: Orthopedics;  Laterality: N/A;  . KYPHOPLASTY N/A 06/04/2015   Procedure: KYPHOPLASTY L-5;  Surgeon: Hessie Knows, MD;  Location: [facility];  Service: Orthopedics;  Laterality: N/A;  . MEDIAL PARTIAL KNEE REPLACEMENT Right   . OOPHORECTOMY Bilateral   . ORIF NASAL FRACTURE N/A 04/09/2020   Procedure: OPEN REDUCTION INTERNAL FIXATION (ORIF) NASAL FRACTURE;  Surgeon: Margaretha Sheffield, MD;  Location: [facility];  Service: ENT;  Laterality: N/A;  . REPAIR EXTENSOR TENDON Left 01/08/2019   Procedure: Realignment  EXTENSOR TENDON;  Surgeon: Hessie Knows, MD;  Location: [facility];  Service: Orthopedics;  Laterality: Left;  . REVERSE SHOULDER  ARTHROPLASTY Right 04/18/2019   Procedure: REVERSE SHOULDER ARTHROPLASTY;  Surgeon: Corky Mull, MD;  Location: [facility];  Service: Orthopedics;  Laterality: Right;  . SHOULDER ARTHROSCOPY WITH ROTATOR CUFF REPAIR AND SUBACROMIAL DECOMPRESSION Right 04/13/2016   Procedure: SHOULDER ARTHROSCOPY WITH ROTATOR CUFF REPAIR AND SUBACROMIAL DECOMPRESSION, release of long head biceps tendon;  Surgeon: Leanor Kail, MD;  Location: [facility];  Service: Orthopedics;  Laterality: Right;  . TOE SURGERY Right 2013  . TONSILLECTOMY  1995  . UPPER GI ENDOSCOPY    . WRIST FRACTURE SURGERY Bilateral      Family History  Problem Relation Age of Onset  . Diabetes Mother   . Emphysema Mother   . Emphysema Father   . Colon cancer Maternal Grandfather   . Breast cancer Neg Hx   . Ovarian cancer Neg Hx      Social History   Tobacco Use  . Smoking status: Never Smoker  . Smokeless tobacco: Never Used  Vaping Use  . Vaping Use: Never used  Substance Use Topics  . Alcohol use: No  . Drug use: No    Allergies  Allergen Reactions  . Ciprofloxacin Itching and Rash  . Levofloxacin Hives and Rash  . Methotrexate     Other reaction(s): Other (See Comments) Mouth Ulcers/[place] Mouth and throat ulcers  . Methotrexate Derivatives     Mouth and throat ulcers  . Other Other (See Comments) and Rash    Other reaction(s): Other (See Comments), Unknown Per pts MD she is unable to take this medication because it is contraindicated with her other medications. Per pts MD she is unable to take this medication because it is contraindicated with her other medications.  Other reaction(s): Other (See Comments), Unknown Other reaction(s): Other (See Comments), Unknown Per pts MD she is unable to take this medication because it is contraindicated with her other medications. Per pts MD she is unable to take this medication because it is contraindicated with her other medications. Other reaction(s): Other (See  Comments), Unknown Other reaction(s): Other (See Comments), Unknown Per pts MD she is unable to take this medication because it is contraindicated with her  Per pts MD she is unable to take this medication because it is contraindicated with her other medications. Other reaction(s): Other (See Comments) Other reaction(s): Other (See Comments), Unknown Per pts MD she is unable to take this medication because it is contraindicated with her other medications. Per pts MD she is unable to take this medication because it is  contraindicated with her other medications.  Other reaction(s): Other (See Comments), Unknown Other reaction(s): Other (See Comments), Unknown Per pts MD she is unable to take this medication because it is contraindicated with her other medications. Per pts MD she is unable to take this medication because it is contraindicated with her other medications. Other reaction(s): Other (See Comments), Unknown Other reaction(s): Other (See Comments), Unknown Per pts MD she is unable to take this medication because it is contraindicated with her  Per pts MD she is unable to take this medication because it is contraindicated with her other medications.  . Sulfa Antibiotics Other (See Comments)    Per pts MD she is unable to take this medication because it is contraindicated with her other medications.  . Cefazolin Itching and Rash  . Cefprozil Rash  . Cephalosporins Itching and Rash  . Enbrel [Etanercept] Itching and Rash  . Humira [Adalimumab] Itching and Rash  . Hydrocodone-Acetaminophen Itching and Other (See Comments)    Itching only  . Lorabid [Loracarbef] Itching and Rash  . Meropenem Itching and Rash  . Oxycodone Itching, Other (See Comments) and Rash    Other reaction(s): Other (See Comments)  . Oxycodone-Acetaminophen Itching  . Primidone Itching and Rash    Current Outpatient Medications  Medication Sig Dispense Refill  . acetaminophen (TYLENOL 8 HOUR  ARTHRITIS PAIN) 650 MG CR tablet Take 1,300 mg by mouth every 8 (eight) hours as needed for pain.    Marland Kitchen alendronate (FOSAMAX) 70 MG tablet Take 70 mg by mouth every Monday. Take with a full glass of water on an empty stomach.     . chlorhexidine (PERIDEX) 0.12 % solution Use as directed 15 mLs in the mouth or throat as needed.     . cholecalciferol (VITAMIN D3) 25 MCG (1000 UT) tablet Take 1,000 Units by mouth daily.    . cyanocobalamin (,VITAMIN B-12,) 1000 MCG/ML injection Inject 1,000 mcg into the muscle every 30 (thirty) days.    . diazepam (VALIUM) 10 MG tablet Take 1 tablet (10 mg total) by mouth daily as needed (vaginal pain with intercourse, use before sexual activity per vagina). (Patient taking differently: Take 10 mg by mouth as needed (vaginal pain with intercourse, use before sexual activity per vagina). ) 30 tablet 0  . diphenoxylate-atropine (LOMOTIL) 2.5-0.025 MG per tablet Take 1 tablet by mouth daily. am    . ferrous sulfate 325 (65 FE) MG tablet Take 1 tablet (325 mg total) by mouth 2 (two) times daily with a meal. (Patient taking differently: Take 325 mg by mouth. 3 times a week) 60 tablet 0  . fluticasone (FLONASE) 50 MCG/ACT nasal spray as needed.     . furosemide (LASIX) 20 MG tablet Take 20 mg by mouth daily. am    . gabapentin (NEURONTIN) 100 MG capsule Take 100 mg in the morning    . gabapentin (NEURONTIN) 400 MG capsule Take 400 mg by mouth at bedtime. 100 mg every morning also    . lidocaine (XYLOCAINE) 2 % solution Use as directed 15 mLs in the mouth or throat as needed for mouth pain.    . magnesium oxide (MAG-OX) 400 MG tablet Take 400 mg by mouth. 3 times per week    . Multiple Vitamin (MULTIVITAMIN WITH MINERALS) TABS tablet Take 1 tablet by mouth daily.    Marland Kitchen omeprazole (PRILOSEC) 40 MG capsule Take 40 mg by mouth daily before breakfast.     . potassium chloride SA (K-DUR,KLOR-CON) 20 MEQ tablet Take  20 mEq by mouth 3 (three) times a week.     . predniSONE (DELTASONE)  1 MG tablet Take 3 mg by mouth daily with breakfast.     . Probiotic Product (PROBIOTIC PO) Take 1 capsule by mouth daily.     Marland Kitchen rOPINIRole (REQUIP) 1 MG tablet Take 1 mg by mouth at bedtime as needed (for restless legs).     . sucralfate (CARAFATE) 1 g tablet Take 0.5 g by mouth 3 (three) times daily with meals as needed (for stomach cramping (crohn's disease)).     Marland Kitchen telmisartan (MICARDIS) 40 MG tablet Take 20 mg by mouth. M< W< FRI    . Tofacitinib Citrate (XELJANZ) 5 MG TABS Take 5 mg by mouth. Take 2 daily    . traMADol (ULTRAM) 50 MG tablet Take 1 tablet (50 mg total) by mouth every 6 (six) hours as needed. 15 tablet 0  . TRELEGY ELLIPTA 100-62.5-25 MCG/INH AEPB Inhale 1 puff into the lungs daily.     No current facility-administered medications for this visit.      REVIEW OF SYSTEMS (Negative unless checked)  Constitutional: _0 Weight loss  _1 Fever  _2 Chills Cardiac: _3 Chest pain   _4 Chest pressure   _5 Palpitations   _6 Shortness of breath when laying flat   _7 Shortness of breath at rest   _8 Shortness of breath with exertion. Vascular:  _9 Pain in legs with walking   _10 Pain in legs at rest   _11 Pain in legs when laying flat   _12 Claudication   _13 Pain in feet when walking  _14 Pain in feet at rest  _15 Pain in feet when laying flat   _16 History of DVT   _17 Phlebitis   _18 Swelling in legs   _19 Varicose veins   _20 Non-healing ulcers Pulmonary:   _21 Uses home oxygen   _22 Productive cough   _23 Hemoptysis   _24 Wheeze  _25 COPD   _26 Asthma Neurologic:  _27 Dizziness  _28 Blackouts   _29 Seizures   _30 History of stroke   _31 History of TIA  _32 Aphasia   _33 Temporary blindness   _34 Dysphagia   _35 Weakness or numbness in arms   _36 Weakness or numbness in legs Musculoskeletal:  _37 Arthritis   _38 Joint swelling   _39 Joint pain   _40 Low back pain Hematologic:  _41 Easy bruising  _42 Easy bleeding   _43 Hypercoagulable state   _44 Anemic  _45 Hepatitis Gastrointestinal:  _46 Blood in stool   _47 Vomiting blood  _48 Gastroesophageal  reflux/heartburn   _49 Abdominal pain Genitourinary:  _50 Chronic kidney disease   _51 Difficult urination  _52 Frequent urination  _53 Burning with urination   _54 Hematuria Skin:  _55 Rashes   _56 Ulcers   _57 Wounds Psychological:  _58 History of anxiety   _59  History of major depression.    Physical Exam BP 132/75 (BP Location: Left Arm)   Pulse 87   Resp 16   Ht _60  (1.575 m)   Wt 186 lb 6.4 oz (84.6 kg)   BMI 34.09 kg/m  Gen:  WD/WN, NAD Head: [place]/AT, No temporalis wasting.  Ear/Nose/Throat: Hearing grossly intact, nares w/o erythema or drainage, oropharynx w/o Erythema/Exudate Eyes: Conjunctiva clear, sclera non-icteric  Neck: trachea midline.  No JVD.  Pulmonary:  Good air movement, respirations not labored, no use of accessory muscles  Cardiac: RRR, no JVD Vascular:  Vessel Right Left  Radial Palpable Palpable                          DP 2+ 1+  PT 1+ trace   Gastrointestinal:. No masses, surgical incisions, or scars. Musculoskeletal: M/S 5/5 throughout.  Extremities without ischemic  changes.  No deformity or atrophy. 1+ RLE edema, 1-2+ LLE edema. Neurologic: Sensation grossly intact in extremities.  Symmetrical.  Speech is fluent. Motor exam as listed above. Psychiatric: Judgment intact, Mood & affect appropriate for pt's clinical situation. Dermatologic: No rashes or ulcers noted.  No cellulitis or open wounds.    Radiology No results found.  Labs Recent Results (from the past 2160 hour(s))  CBC with Differential     Status: Abnormal   Collection Time: 03/26/20  9:52 AM  Result Value Ref Range   WBC 6.0 4.0 - 10.5 K/uL   RBC 3.16 (L) 3.87 - 5.11 MIL/uL   Hemoglobin 10.3 (L) 12.0 - 15.0 g/dL   HCT 30.2 (L) 36 - 46 %   MCV 95.6 80.0 - 100.0 fL   MCH 32.6 26.0 - 34.0 pg   MCHC 34.1 30.0 - 36.0 g/dL   RDW 12.7 11.5 - 15.5 %   Platelets 223 150 - 400 K/uL   nRBC 0.0 0.0 - 0.2 %   Neutrophils Relative % 70 %   Neutro Abs 4.2 1.7 - 7.7 K/uL   Lymphocytes Relative 16 %    Lymphs Abs 1.0 0.7 - 4.0 K/uL   Monocytes Relative 11 %   Monocytes Absolute 0.6 0.1 - 1.0 K/uL   Eosinophils Relative 2 %   Eosinophils Absolute 0.1 0.0 - 0.5 K/uL   Basophils Relative 1 %   Basophils Absolute 0.0 0.0 - 0.1 K/uL   Immature Granulocytes 0 %   Abs Immature Granulocytes 0.02 0.00 - 0.07 K/uL    Comment: Performed at [facility], [address] [place], [place] [address]  Ferritin     Status: Abnormal   Collection Time: 03/26/20  9:52 AM  Result Value Ref Range   Ferritin 1,163 (H) 11 - 307 ng/mL    Comment: Performed at [facility], [address] [place], [place] [address]  Iron and TIBC     Status: None   Collection Time: 03/26/20  9:52 AM  Result Value Ref Range   Iron 75 28 - 170 ug/dL   TIBC 270 250 - 450 ug/dL   Saturation Ratios 28 10.4 - 31.8 %   UIBC 195 ug/dL    Comment: Performed at [facility], [address] [place], [place] [address]  SARS CORONAVIRUS 2 (TAT 6-24 HRS) Nasopharyngeal Nasopharyngeal Swab     Status: None   Collection Time: 04/07/20 10:11 AM   Specimen: Nasopharyngeal Swab  Result Value Ref Range   SARS Coronavirus 2 NEGATIVE NEGATIVE    Comment: (NOTE) SARS-CoV-2 target nucleic acids are NOT DETECTED.  The SARS-CoV-2 RNA is generally detectable in upper and lower respiratory specimens during the acute phase of infection. Negative results do not preclude SARS-CoV-2 infection, do not rule out co-infections with other pathogens, and should not be used as the sole basis for treatment or other patient management decisions. Negative results must be combined with clinical observations, patient history, and epidemiological information. The expected result is Negative.  Fact Sheet for Patients: SugarRoll.be  Fact Sheet for Healthcare Providers: https://www.woods-mathews.com/  This test is not yet approved or cleared by the [place] FDA and  has been  authorized for detection and/or diagnosis of SARS-CoV-2 by FDA under an Emergency Use Authorization (EUA). This EUA will remain  in effect (meaning this test can be used) for the duration of the COVID-19 declaration under Se ction 564(b)(1) of the Act, 21 U.S.C. section 360bbb-3(b)(1), unless the authorization is  terminated or revoked sooner.  Performed at [facility], [place] [address], [place], [place] [address]   SARS CORONAVIRUS 2 (TAT 6-24 HRS) Nasopharyngeal Nasopharyngeal Swab     Status: None   Collection Time: 05/29/20 11:05 AM   Specimen: Nasopharyngeal Swab  Result Value Ref Range   SARS Coronavirus 2 NEGATIVE NEGATIVE    Comment: (NOTE) SARS-CoV-2 target nucleic acids are NOT DETECTED.  The SARS-CoV-2 RNA is generally detectable in upper and lower respiratory specimens during the acute phase of infection. Negative results do not preclude SARS-CoV-2 infection, do not rule out co-infections with other pathogens, and should not be used as the sole basis for treatment or other patient management decisions. Negative results must be combined with clinical observations, patient history, and epidemiological information. The expected result is Negative.  Fact Sheet for Patients: SugarRoll.be  Fact Sheet for Healthcare Providers: https://www.woods-mathews.com/  This test is not yet approved or cleared by the [place] FDA and  has been authorized for detection and/or diagnosis of SARS-CoV-2 by FDA under an Emergency Use Authorization (EUA). This EUA will remain  in effect (meaning this test can be used) for the duration of the COVID-19 declaration under Se ction 564(b)(1) of the Act, 21 U.S.C. section 360bbb-3(b)(1), unless the authorization is terminated or revoked sooner.  Performed at [facility], [place] [address] [place], [place] [address]   APTT     Status: None   Collection Time: 05/29/20 11:05 AM  Result  Value Ref Range   aPTT 25 24 - 36 seconds    Comment: Performed at [facility], [address] [place], [place] [address]  Protime-INR     Status: None   Collection Time: 05/29/20 11:05 AM  Result Value Ref Range   Prothrombin Time 12.6 11.4 - 15.2 seconds   INR 1.0 0.8 - 1.2    Comment: (NOTE) INR goal varies based on device and disease states. Performed at [facility], [address] [place], [place] [address]   Potassium     Status: Abnormal   Collection Time: 05/29/20 11:05 AM  Result Value Ref Range   Potassium 3.2 (L) 3.5 - 5.1 mmol/L    Comment: Performed at [facility], [address] [place], [place] [address]  [place], [place] [address]     Status: None   Collection Time: 06/01/20  1:33 PM  Result Value Ref Range   Sodium 140 135 - 145 mmol/L   Potassium 3.5 3.5 - 5.1 mmol/L   Chloride 101 98 - 111 mmol/L   BUN 14 8 - 23 mg/dL   Creatinine, Ser 0.90 0.44 - 1.00 mg/dL   Glucose, Bld 96 70 - 99 mg/dL    Comment: Glucose reference range applies only to samples taken after fasting for at least 8 hours.   Calcium, Ion 1.18 1.15 - 1.40 mmol/L   TCO2 26 22 - 32 mmol/L   Hemoglobin 12.9 12.0 - 15.0 g/dL   HCT 38.0 36 - 46 %  KOH prep     Status: None   Collection Time: 06/01/20  3:29 PM   Specimen: Bronchoalveolar Lavage  Result Value Ref Range   Specimen Description BRONCHIAL ALVEOLAR LAVAGE    Special Requests NONE    KOH Prep      NO YEAST OR FUNGAL ELEMENTS SEEN Performed at [facility], [address] [place], [place] [address]    Report Status 06/02/2020 FINAL   Culture, respiratory  Status: None (Preliminary result)   Collection Time: 06/01/20  3:29 PM   Specimen: Bronchoalveolar Lavage; Respiratory  Result Value Ref Range   Specimen Description      BRONCHIAL ALVEOLAR LAVAGE Performed at [facility], [address] [place], [place] [address]    Special Requests      NONE Performed at [facility], [place], [place] [address]    Gram Stain NO WBC SEEN NO ORGANISMS SEEN     Culture      NO GROWTH < 12 HOURS Performed at [facility], [place] [address] [place], [place] [address]    Report Status PENDING     Assessment/Plan:  Swelling of limb I have had a long discussion with the patient regarding swelling and why it  causes symptoms.  Patient will begin wearing graduated compression stockings class 1 (20-30 mmHg) on a daily basis a prescription was given. The patient will  beginning wearing the stockings first thing in the morning and removing them in the evening. The patient is instructed specifically not to sleep in the stockings.   In addition, behavioral modification will be initiated.  This will include frequent elevation, use of over the counter pain medications and exercise such as walking.  I have reviewed systemic causes for chronic edema such as liver, kidney and cardiac etiologies.  The patient denies problems with these organ systems.    Consideration for a lymph pump will also be made based upon the effectiveness of conservative therapy.  This would help to improve the edema control and prevent sequela such as ulcers and infections   Patient should undergo duplex ultrasound of the venous system to ensure that DVT or reflux is not present.  The patient will follow-up with me after the ultrasound.    Leg pain, left Seems to worsen with swelling.  Assessment and work-up as above.  Type 2 diabetes mellitus with diabetic neuropathy (HCC) blood glucose control important in reducing the progression of atherosclerotic disease. Also, involved in wound healing. On appropriate medications.  Would be suspicious that a component of neuropathy is a major contributing factor as well.  HTN, goal below 140/80 blood pressure control important in reducing the progression of atherosclerotic disease. On appropriate oral medications.       Leotis Pain 06/02/2020, 3:27 PM   This note was created with Dragon medical transcription system.  Any errors from dictation are unintentional.

## 2020-06-02 NOTE — Assessment & Plan Note (Signed)
Seems to worsen with swelling.  Assessment and work-up as above.

## 2020-06-02 NOTE — Assessment & Plan Note (Signed)
I have had a long discussion with the patient regarding swelling and why it  causes symptoms.  Patient will begin wearing graduated compression stockings class 1 (20-30 mmHg) on a daily basis a prescription was given. The patient will  beginning wearing the stockings first thing in the morning and removing them in the evening. The patient is instructed specifically not to sleep in the stockings.   In addition, behavioral modification will be initiated.  This will include frequent elevation, use of over the counter pain medications and exercise such as walking.  I have reviewed systemic causes for chronic edema such as liver, kidney and cardiac etiologies.  The patient denies problems with these organ systems.    Consideration for a lymph pump will also be made based upon the effectiveness of conservative therapy.  This would help to improve the edema control and prevent sequela such as ulcers and infections   Patient should undergo duplex ultrasound of the venous system to ensure that DVT or reflux is not present.  The patient will follow-up with me after the ultrasound.

## 2020-06-02 NOTE — Assessment & Plan Note (Signed)
blood pressure control important in reducing the progression of atherosclerotic disease. On appropriate oral medications.  

## 2020-06-02 NOTE — Patient Instructions (Signed)
Edema  Edema is when you have too much fluid in your body or under your skin. Edema may make your legs, feet, and ankles swell up. Swelling is also common in looser tissues, like around your eyes. This is a common condition. It gets more common as you get older. There are many possible causes of edema. Eating too much salt (sodium) and being on your feet or sitting for a long time can cause edema in your legs, feet, and ankles. Hot weather may make edema worse. Edema is usually painless. Your skin may look swollen or shiny. Follow these instructions at home:  Keep the swollen body part raised (elevated) above the level of your heart when you are sitting or lying down.  Do not sit still or stand for a long time.  Do not wear tight clothes. Do not wear garters on your upper legs.  Exercise your legs. This can help the swelling go down.  Wear elastic bandages or support stockings as told by your doctor.  Eat a low-salt (low-sodium) diet to reduce fluid as told by your doctor.  Depending on the cause of your swelling, you may need to limit how much fluid you drink (fluid restriction).  Take over-the-counter and prescription medicines only as told by your doctor. Contact a doctor if:  Treatment is not working.  You have heart, liver, or kidney disease and have symptoms of edema.  You have sudden and unexplained weight gain. Get help right away if:  You have shortness of breath or chest pain.  You cannot breathe when you lie down.  You have pain, redness, or warmth in the swollen areas.  You have heart, liver, or kidney disease and get edema all of a sudden.  You have a fever and your symptoms get worse all of a sudden. Summary  Edema is when you have too much fluid in your body or under your skin.  Edema may make your legs, feet, and ankles swell up. Swelling is also common in looser tissues, like around your eyes.  Raise (elevate) the swollen body part above the level of your  heart when you are sitting or lying down.  Follow your doctor's instructions about diet and how much fluid you can drink (fluid restriction). This information is not intended to replace advice given to you by your health care provider. Make sure you discuss any questions you have with your health care provider. Document Revised: 08/04/2017 Document Reviewed: 08/19/2016 Elsevier Patient Education  2020 Elsevier Inc.  

## 2020-06-03 LAB — ACID FAST SMEAR (AFB, MYCOBACTERIA): Acid Fast Smear: NEGATIVE

## 2020-06-03 LAB — ASPERGILLUS ANTIGEN, BAL/SERUM: Aspergillus Ag, BAL/Serum: 0.06 Index (ref 0.00–0.49)

## 2020-06-04 ENCOUNTER — Ambulatory Visit: Payer: Medicare Other

## 2020-06-04 ENCOUNTER — Other Ambulatory Visit
Admission: RE | Admit: 2020-06-04 | Discharge: 2020-06-04 | Disposition: A | Discharge status: Home / Self Care | Payer: Medicare Other | Source: Ambulatory Visit | Attending: Internal Medicine | Admitting: Internal Medicine

## 2020-06-04 DIAGNOSIS — R55 Syncope and collapse: Secondary | ICD-10-CM | POA: Diagnosis present

## 2020-06-04 DIAGNOSIS — R531 Weakness: Secondary | ICD-10-CM | POA: Insufficient documentation

## 2020-06-04 DIAGNOSIS — R110 Nausea: Secondary | ICD-10-CM | POA: Insufficient documentation

## 2020-06-04 LAB — CULTURE, RESPIRATORY W GRAM STAIN
Culture: NO GROWTH
Gram Stain: NONE SEEN

## 2020-06-04 LAB — CMV DNA BY PCR, QUALITATIVE

## 2020-06-04 LAB — TROPONIN I (HIGH SENSITIVITY): Troponin I (High Sensitivity): 6 ng/L (ref <18)

## 2020-06-10 LAB — MISC LABCORP TEST (SEND OUT): Labcorp test code: 819330

## 2020-06-11 LAB — PNEUMOCYSTIS PCR: Result Pneumocystis PCR: NEGATIVE

## 2020-06-15 NOTE — Progress Notes (Signed)
06/16/2020 2:16 PM   Nicole Bass 1951-02-07 [id number]  Referring provider: [name], MD [facility] [place],  [place] [address] Chief Complaint  Patient presents with  . Recurrent UTI    follow up    HPI: Nicole Bass is a 69 y.o. female who returns for a 1 year follow up recurrent UTI's and nephrolithiasis.   Patient was last evaluated by Debroah Loop, PA-C on 06/19/2019 (see note for details).   Recent UA from 06/09/2020 showed moderate blood, large leukocytes, >50 WBC's, 10-50 RBC's and rare bacteria. Urine culture grew 10,000 - 25,000 mixed urogenital flora. Patient was treated with nitrofurantoin.   Patient fell back in 03/2020 and broke several bones and had to undergo surgery. She had low back pain near the sciatica. She has a broken shoulder and is in pain.   She has had "kidney pain" x 1 week ago which is overlying her right kidney and radiates down to her right buttock. She feels like it is "grippy". She had urgency and felt relief after urination. She as placed on antibiotics although her urine culture was negative but her urine was quite suspicious.. She has a weak stream, urinary urgency and frequency. Denies incomplete bladder emptying. Her "grippy" feeling is better.  Her right "kidney pain" persist.  Patient has a history of Chron's disease.   PMH: Past Medical History:  Diagnosis Date  . Anemia    iron everyday, 3 iron infusions last one August 2020  . Anxiety    nervous  . Arthritis    rheumatoid  . Collagen vascular disease ([place])   . Crohn's disease ([place])   . Crohn's disease ([place])   . DDD (degenerative disc disease), cervical   . DDD (degenerative disc disease), cervical   . DDD (degenerative disc disease), cervical 2003  . DDD (degenerative disc disease), lumbar   . Degenerative disc disease, cervical   . Dyspnea    out of shape  . GERD (gastroesophageal reflux disease)   . Hand weakness     left hand worse, small tremors  . Headache    with accident  . History of claustrophobia   . History of kidney stones   . HOH (hard of hearing)    left ear  . Hypertension     controlled on meds  . Lymphatic disorder   . Meniere's disease   . Motion sickness   . Nasal fracture 03/27/2020   due to fall  . Neuromuscular disorder ([place])   . Neuropathy    legs  . Peripheral neuropathy   . 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 UTI (urinary tract infection)    HO  . Vertigo    can be accompanied by nausea  . Wears dentures    upper dentures    Surgical History: Past Surgical History:  Procedure Laterality Date  . ABDOMINAL HYSTERECTOMY    . APPENDECTOMY    . BACK SURGERY     x 3  . CERVICAL SPINE SURGERY     metal plate  . [place]   X 2  . CHOLECYSTECTOMY    . CLOSED REDUCTION NASAL FRACTURE N/A 04/09/2020   Procedure: CLOSED REDUCTION NASAL FRACTURE;  Surgeon: Margaretha Sheffield, MD;  Location: [facility] [place];  Service: ENT;  Laterality: N/A;  . [place]   removed 18 inches of colon,removed appendix  . COLONOSCOPY    . dental implants  3 teeth/ wears dentures  . DILATION AND CURETTAGE OF UTERUS  2004  . ELBOW FRACTURE SURGERY Left 2008  . FIBEROPTIC BRONCHOSCOPY N/A 06/01/2020   Procedure: BEDSIDE BRONCHOSCOPY FIBEROPTIC;  Surgeon: Ottie Glazier, MD;  Location: [facility];  Service: Thoracic;  Laterality: N/A;  . FRACTURE SURGERY    . JOINT REPLACEMENT Right 2015   partial knee replacement  . KYPHOPLASTY    . KYPHOPLASTY N/A 04/14/2015   Procedure: KYPHOPLASTY;  Surgeon: Hessie Knows, MD;  Location: [facility];  Service: Orthopedics;  Laterality: N/A;  . KYPHOPLASTY N/A 06/04/2015   Procedure: KYPHOPLASTY L-5;  Surgeon: Hessie Knows, MD;  Location: [facility];  Service: Orthopedics;  Laterality: N/A;  . MEDIAL PARTIAL KNEE REPLACEMENT Right   . OOPHORECTOMY Bilateral   . ORIF NASAL FRACTURE N/A 04/09/2020   Procedure: OPEN REDUCTION  INTERNAL FIXATION (ORIF) NASAL FRACTURE;  Surgeon: Margaretha Sheffield, MD;  Location: [facility];  Service: ENT;  Laterality: N/A;  . REPAIR EXTENSOR TENDON Left 01/08/2019   Procedure: Realignment  EXTENSOR TENDON;  Surgeon: Hessie Knows, MD;  Location: [facility];  Service: Orthopedics;  Laterality: Left;  . REVERSE SHOULDER ARTHROPLASTY Right 04/18/2019   Procedure: REVERSE SHOULDER ARTHROPLASTY;  Surgeon: Corky Mull, MD;  Location: [facility];  Service: Orthopedics;  Laterality: Right;  . SHOULDER ARTHROSCOPY WITH ROTATOR CUFF REPAIR AND SUBACROMIAL DECOMPRESSION Right 04/13/2016   Procedure: SHOULDER ARTHROSCOPY WITH ROTATOR CUFF REPAIR AND SUBACROMIAL DECOMPRESSION, release of long head biceps tendon;  Surgeon: Leanor Kail, MD;  Location: [facility];  Service: Orthopedics;  Laterality: Right;  . TOE SURGERY Right 2013  . TONSILLECTOMY  1995  . UPPER GI ENDOSCOPY    . WRIST FRACTURE SURGERY Bilateral     Home Medications:  Allergies as of 06/16/2020      Reactions   Ciprofloxacin Itching, Rash   Levofloxacin Hives, Rash   Methotrexate    Other reaction(s): Other (See Comments) Mouth Ulcers/[place] Mouth and throat ulcers   Methotrexate Derivatives    Mouth and throat ulcers   Other Other (See Comments), Rash   Other reaction(s): Other (See Comments), Unknown Per pts MD she is unable to take this medication because it is contraindicated with her other medications. Per pts MD she is unable to take this medication because it is contraindicated with her other medications.  Other reaction(s): Other (See Comments), Unknown Other reaction(s): Other (See Comments), Unknown Per pts MD she is unable to take this medication because it is contraindicated with her other medications. Per pts MD she is unable to take this medication because it is contraindicated with her other medications. Other reaction(s): Other (See Comments), Unknown Other reaction(s): Other (See Comments),  Unknown Per pts MD she is unable to take this medication because it is contraindicated with her  Per pts MD she is unable to take this medication because it is contraindicated with her other medications. Other reaction(s): Other (See Comments) Other reaction(s): Other (See Comments), Unknown Per pts MD she is unable to take this medication because it is contraindicated with her other medications. Per pts MD she is unable to take this medication because it is contraindicated with her other medications.  Other reaction(s): Other (See Comments), Unknown Other reaction(s): Other (See Comments), Unknown Per pts MD she is unable to take this medication because it is contraindicated with her other medications. Per pts MD she is unable to take this medication because it is contraindicated with her other medications. Other reaction(s): Other (See Comments), Unknown Other reaction(s):  Other (See Comments), Unknown Per pts MD she is unable to take this medication because it is contraindicated with her  Per pts MD she is unable to take this medication because it is contraindicated with her other medications.   Sulfa Antibiotics Other (See Comments)   Per pts MD she is unable to take this medication because it is contraindicated with her other medications.   Cefazolin Itching, Rash   Cefprozil Rash   Cephalosporins Itching, Rash   Enbrel [etanercept] Itching, Rash   Humira [adalimumab] Itching, Rash   Hydrocodone-acetaminophen Itching, Other (See Comments)   Itching only   Lorabid [loracarbef] Itching, Rash   Meropenem Itching, Rash   Oxycodone Itching, Other (See Comments), Rash   Other reaction(s): Other (See Comments)   Oxycodone-acetaminophen Itching   Primidone Itching, Rash      Medication List       Accurate as of June 16, 2020  2:16 PM. If you have any questions, ask your nurse or doctor.        STOP taking these medications   nitrofurantoin  (macrocrystal-monohydrate) 100 MG capsule Commonly known as: MACROBID Stopped by: Hollice Espy, MD     TAKE these medications   alendronate 70 MG tablet Commonly known as: FOSAMAX Take 70 mg by mouth every Monday. Take with a full glass of water on an empty stomach.   chlorhexidine 0.12 % solution Commonly known as: PERIDEX Use as directed 15 mLs in the mouth or throat as needed.   cholecalciferol 25 MCG (1000 UNIT) tablet Commonly known as: VITAMIN D3 Take 1,000 Units by mouth daily.   cyanocobalamin 1000 MCG/ML injection Commonly known as: (VITAMIN B-12) Inject 1,000 mcg into the muscle every 30 (thirty) days.   diazepam 10 MG tablet Commonly known as: Valium Take 1 tablet (10 mg total) by mouth daily as needed (vaginal pain with intercourse, use before sexual activity per vagina). What changed: when to take this   diphenoxylate-atropine 2.5-0.025 MG tablet Commonly known as: LOMOTIL Take 1 tablet by mouth daily. am   ferrous sulfate 325 (65 FE) MG tablet Take 1 tablet (325 mg total) by mouth 2 (two) times daily with a meal. What changed:   when to take this  additional instructions   fluticasone 50 MCG/ACT nasal spray Commonly known as: FLONASE as needed.   furosemide 20 MG tablet Commonly known as: LASIX Take 20 mg by mouth daily. am   gabapentin 400 MG capsule Commonly known as: NEURONTIN Take 400 mg by mouth at bedtime. 100 mg every morning also   gabapentin 100 MG capsule Commonly known as: NEURONTIN Take 100 mg in the morning   lidocaine 2 % solution Commonly known as: XYLOCAINE Use as directed 15 mLs in the mouth or throat as needed for mouth pain.   magnesium oxide 400 MG tablet Commonly known as: MAG-OX Take 400 mg by mouth. 3 times per week   multivitamin with minerals Tabs tablet Take 1 tablet by mouth daily.   omeprazole 40 MG capsule Commonly known as: PRILOSEC Take 40 mg by mouth daily before breakfast.   potassium chloride SA 20  MEQ tablet Commonly known as: KLOR-CON Take 20 mEq by mouth 3 (three) times a week.   predniSONE 1 MG tablet Commonly known as: DELTASONE Take 3 mg by mouth daily with breakfast.   PROBIOTIC PO Take 1 capsule by mouth daily.   rOPINIRole 1 MG tablet Commonly known as: REQUIP Take 1 mg by mouth at bedtime as needed (for restless  legs).   sucralfate 1 g tablet Commonly known as: CARAFATE Take 0.5 g by mouth 3 (three) times daily with meals as needed (for stomach cramping (crohn's disease)).   telmisartan 40 MG tablet Commonly known as: MICARDIS Take 20 mg by mouth. M< W< FRI   traMADol 50 MG tablet Commonly known as: ULTRAM Take 1 tablet (50 mg total) by mouth every 6 (six) hours as needed.   Trelegy Ellipta 100-62.5-25 MCG/INH Aepb Generic drug: Fluticasone-Umeclidin-Vilant Inhale 1 puff into the lungs daily.   Tylenol 8 Hour Arthritis Pain 650 MG CR tablet Generic drug: acetaminophen Take 1,300 mg by mouth every 8 (eight) hours as needed for pain.   Xeljanz 5 MG Tabs Generic drug: Tofacitinib Citrate Take 5 mg by mouth. Take 2 daily       Allergies:  Allergies  Allergen Reactions  . Ciprofloxacin Itching and Rash  . Levofloxacin Hives and Rash  . Methotrexate     Other reaction(s): Other (See Comments) Mouth Ulcers/[place] Mouth and throat ulcers  . Methotrexate Derivatives     Mouth and throat ulcers  . Other Other (See Comments) and Rash    Other reaction(s): Other (See Comments), Unknown Per pts MD she is unable to take this medication because it is contraindicated with her other medications. Per pts MD she is unable to take this medication because it is contraindicated with her other medications.  Other reaction(s): Other (See Comments), Unknown Other reaction(s): Other (See Comments), Unknown Per pts MD she is unable to take this medication because it is contraindicated with her other medications. Per pts MD she is unable to take this medication  because it is contraindicated with her other medications. Other reaction(s): Other (See Comments), Unknown Other reaction(s): Other (See Comments), Unknown Per pts MD she is unable to take this medication because it is contraindicated with her  Per pts MD she is unable to take this medication because it is contraindicated with her other medications. Other reaction(s): Other (See Comments) Other reaction(s): Other (See Comments), Unknown Per pts MD she is unable to take this medication because it is contraindicated with her other medications. Per pts MD she is unable to take this medication because it is contraindicated with her other medications.  Other reaction(s): Other (See Comments), Unknown Other reaction(s): Other (See Comments), Unknown Per pts MD she is unable to take this medication because it is contraindicated with her other medications. Per pts MD she is unable to take this medication because it is contraindicated with her other medications. Other reaction(s): Other (See Comments), Unknown Other reaction(s): Other (See Comments), Unknown Per pts MD she is unable to take this medication because it is contraindicated with her  Per pts MD she is unable to take this medication because it is contraindicated with her other medications.  . Sulfa Antibiotics Other (See Comments)    Per pts MD she is unable to take this medication because it is contraindicated with her other medications.  . Cefazolin Itching and Rash  . Cefprozil Rash  . Cephalosporins Itching and Rash  . Enbrel [Etanercept] Itching and Rash  . Humira [Adalimumab] Itching and Rash  . Hydrocodone-Acetaminophen Itching and Other (See Comments)    Itching only  . Lorabid [Loracarbef] Itching and Rash  . Meropenem Itching and Rash  . Oxycodone Itching, Other (See Comments) and Rash    Other reaction(s): Other (See Comments)  . Oxycodone-Acetaminophen Itching  . Primidone Itching and Rash    Family  History: Family History  Problem Relation Age of Onset  . Diabetes Mother   . Emphysema Mother   . Emphysema Father   . Colon cancer Maternal Grandfather   . Breast cancer Neg Hx   . Ovarian cancer Neg Hx     Social History:  reports that she has never smoked. She has never used smokeless tobacco. She reports that she does not drink alcohol and does not use drugs.   Physical Exam: BP 114/67   Pulse (!) 103   Ht 5' 2"  (1.575 m)   Wt 185 lb (83.9 kg)   BMI 33.84 kg/m   Constitutional:  Alert and oriented, No acute distress. HEENT: [place] AT, moist mucus membranes.  Trachea midline, no masses. Cardiovascular: No clubbing, cyanosis, or edema. Respiratory: Normal respiratory effort, no increased work of breathing. Skin: No rashes, bruises or suspicious lesions. Neurologic: Grossly intact, no focal deficits, moving all 4 extremities. Psychiatric: Normal mood and affect.  Laboratory Data:  Lab Results  Component Value Date   CREATININE 0.90 06/01/2020    Urinalysis Negative   Pertinent Imaging: Results for orders placed or performed in visit on 06/16/20  BLADDER SCAN AMB NON-IMAGING  Result Value Ref Range   Scan Result 39m     Assessment & Plan:    1. WEak stream Patient has had ongoing infections and a weak stream. UA is negative today, will send a urine culture.  PVR 67 mL.  No concern for incomplete bladder emptying  2. History of kidney stones Patient's pain is likely more musculoskeletal in nature. Patient has known bilateral stone, KUB in 2020 noted a 5 and 6 mm stone. KUB today.  3. Recurrent UTIs Lower urinary tract symptoms improved with course of Macrobid Urine today is negative Supportive care, treat as needed for UTI type symptoms  Follow up in 1 year.   Return in about 1 year (around 06/16/2021) for 1year w/KUB.  [place] [place] [phone] I, ASelena Batten am  acting as a scribe for Dr. AHollice Espy  I have reviewed the above documentation for accuracy and completeness, and I agree with the above.   AHollice Espy MD

## 2020-06-16 ENCOUNTER — Ambulatory Visit: Payer: Medicare Other | Admitting: Urology

## 2020-06-16 ENCOUNTER — Ambulatory Visit
Admission: RE | Admit: 2020-06-16 | Discharge: 2020-06-16 | Disposition: A | Discharge status: Home / Self Care | Payer: Medicare Other | Source: Ambulatory Visit | Attending: Urology | Admitting: Urology

## 2020-06-16 ENCOUNTER — Encounter: Payer: Self-pay | Admitting: Urology

## 2020-06-16 ENCOUNTER — Other Ambulatory Visit: Payer: Self-pay

## 2020-06-16 VITALS — BP 114/67 | HR 103 | Ht 62.0 in | Wt 185.0 lb

## 2020-06-16 DIAGNOSIS — N390 Urinary tract infection, site not specified: Secondary | ICD-10-CM

## 2020-06-16 DIAGNOSIS — N200 Calculus of kidney: Secondary | ICD-10-CM

## 2020-06-16 DIAGNOSIS — R3912 Poor urinary stream: Secondary | ICD-10-CM | POA: Diagnosis not present

## 2020-06-16 LAB — BLADDER SCAN AMB NON-IMAGING

## 2020-06-17 ENCOUNTER — Encounter: Payer: Self-pay | Admitting: Gastroenterology

## 2020-06-17 ENCOUNTER — Ambulatory Visit: Payer: Medicare Other | Admitting: Gastroenterology

## 2020-06-17 VITALS — BP 125/79 | HR 102 | Temp 97.7°F | Ht 62.0 in | Wt 184.0 lb

## 2020-06-17 DIAGNOSIS — K50819 Crohn's disease of both small and large intestine with unspecified complications: Secondary | ICD-10-CM | POA: Diagnosis not present

## 2020-06-17 LAB — URINALYSIS, COMPLETE
Bilirubin, UA: NEGATIVE
Glucose, UA: NEGATIVE
Ketones, UA: NEGATIVE
Leukocytes,UA: NEGATIVE
Nitrite, UA: NEGATIVE
Protein,UA: NEGATIVE
RBC, UA: NEGATIVE
Specific Gravity, UA: 1.02 (ref 1.005–1.030)
Urobilinogen, Ur: 0.2 mg/dL (ref 0.2–1.0)
pH, UA: 6 (ref 5.0–7.5)

## 2020-06-17 LAB — MICROSCOPIC EXAMINATION: Bacteria, UA: NONE SEEN

## 2020-06-17 NOTE — Progress Notes (Signed)
Cephas Darby, MD [address]  [place]  [place], [place] [address]  Main: [phone]  Fax: [phone]    Gastroenterology Consultation  Referring Provider:     [name], MD Primary Care Physician:  [name], MD Primary Gastroenterologist:  Dr. Cephas Darby Reason for Consultation:     ?  Crohn's disease        HPI:   Nicole Bass is a 69 y.o. female referred by Dr. Doy Hutching, Leonie Douglas, MD  for consultation & management of possible small bowel Crohn's.  Patient has history of rheumatoid arthritis, maintained on Xeljanz and low-dose prednisone, who reports that she is diagnosed with Crohn's disease at age 26 when she presented with small bowel obstruction, underwent surgery by Dr. Jamal Collin with primary anastomosis.  Apparently, patient has not been on any maintenance medication for underlying Crohn's.  She was told that she would have loose stools for rest of her life due to TI resection.  Her most recent fecal calprotectin levels were normal about 6 months ago.  She takes monthly B12 injections.  No evidence of iron deficiency.  In fact, she has hyperferritinemia, followed by Dr. Grayland Ormond.  Hereditary hemochromatosis panel is negative.  It was felt that her ferritin levels are elevated due to inflammation.  Patient was followed by Dr. Vira Agar who is her primary gastroenterologist until recently when he retired.  She was referred to me by her PCP, Dr. Doy Hutching for further management of Crohn's disease  Patient reports having had a colonoscopy 3 to 4 years ago at [facility] by Dr. Vira Agar.  Report not available with me.  Currently, she has 3-4 soft bowel movements daily and takes Lomotil once a day as recommended by Dr. Vira Agar  NSAIDs: None  Antiplts/Anticoagulants/Anti thrombotics: None  GI Procedures:  EGD 11/27/2012 Diagnosis:  STOMACH BIOPSY:  - ANTRAL MUCOSA WITH MILD CHRONIC GASTRITIS AND REACTIVE FOVEOLAR  HYPERPLASIA.  - OXYNTIC MUCOSA  WITH MILD CHRONIC GASTRITIS AND SUPERFICIAL  VASCULAR CONGESTION.  - NO HELICOBACTER PYLORI ARE IDENTIFIED ON DIFF-QUICK STAIN, WITH  AN APPROPRIATE CONTROL.  - NEGATIVE FOR DYSPLASIA AND MALIGNANCY.   Past Medical History:  Diagnosis Date  . Anemia    iron everyday, 3 iron infusions last one August 2020  . Anxiety    nervous  . Arthritis    rheumatoid  . Collagen vascular disease ([place])   . Crohn's disease ([place])   . Crohn's disease ([place])   . DDD (degenerative disc disease), cervical   . DDD (degenerative disc disease), cervical   . DDD (degenerative disc disease), cervical 2003  . DDD (degenerative disc disease), lumbar   . Degenerative disc disease, cervical   . Dyspnea    out of shape  . GERD (gastroesophageal reflux disease)   . Hand weakness    left hand worse, small tremors  . Headache    with accident  . History of claustrophobia   . History of kidney stones   . HOH (hard of hearing)    left ear  . Hypertension     controlled on meds  . Lymphatic disorder   . Meniere's disease   . Motion sickness   . Nasal fracture 03/27/2020   due to fall  . Neuromuscular disorder ([place])   . Neuropathy    legs  . Peripheral neuropathy   . 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 UTI (urinary tract infection)    HO  . Vertigo    can  be accompanied by nausea  . Wears dentures    upper dentures    Past Surgical History:  Procedure Laterality Date  . ABDOMINAL HYSTERECTOMY    . APPENDECTOMY    . BACK SURGERY     x 3  . CERVICAL SPINE SURGERY     metal plate  . [place]   X 2  . CHOLECYSTECTOMY    . CLOSED REDUCTION NASAL FRACTURE N/A 04/09/2020   Procedure: CLOSED REDUCTION NASAL FRACTURE;  Surgeon: Margaretha Sheffield, MD;  Location: [facility];  Service: ENT;  Laterality: N/A;  . [place]   removed 18 inches of colon,removed appendix  . COLONOSCOPY    . dental implants     3 teeth/ wears dentures  . DILATION AND CURETTAGE OF UTERUS   2004  . ELBOW FRACTURE SURGERY Left 2008  . FIBEROPTIC BRONCHOSCOPY N/A 06/01/2020   Procedure: BEDSIDE BRONCHOSCOPY FIBEROPTIC;  Surgeon: Ottie Glazier, MD;  Location: [facility];  Service: Thoracic;  Laterality: N/A;  . FRACTURE SURGERY    . JOINT REPLACEMENT Right 2015   partial knee replacement  . KYPHOPLASTY    . KYPHOPLASTY N/A 04/14/2015   Procedure: KYPHOPLASTY;  Surgeon: Hessie Knows, MD;  Location: [facility];  Service: Orthopedics;  Laterality: N/A;  . KYPHOPLASTY N/A 06/04/2015   Procedure: KYPHOPLASTY L-5;  Surgeon: Hessie Knows, MD;  Location: [facility];  Service: Orthopedics;  Laterality: N/A;  . MEDIAL PARTIAL KNEE REPLACEMENT Right   . OOPHORECTOMY Bilateral   . ORIF NASAL FRACTURE N/A 04/09/2020   Procedure: OPEN REDUCTION INTERNAL FIXATION (ORIF) NASAL FRACTURE;  Surgeon: Margaretha Sheffield, MD;  Location: [facility];  Service: ENT;  Laterality: N/A;  . REPAIR EXTENSOR TENDON Left 01/08/2019   Procedure: Realignment  EXTENSOR TENDON;  Surgeon: Hessie Knows, MD;  Location: [facility];  Service: Orthopedics;  Laterality: Left;  . REVERSE SHOULDER ARTHROPLASTY Right 04/18/2019   Procedure: REVERSE SHOULDER ARTHROPLASTY;  Surgeon: Corky Mull, MD;  Location: [facility];  Service: Orthopedics;  Laterality: Right;  . SHOULDER ARTHROSCOPY WITH ROTATOR CUFF REPAIR AND SUBACROMIAL DECOMPRESSION Right 04/13/2016   Procedure: SHOULDER ARTHROSCOPY WITH ROTATOR CUFF REPAIR AND SUBACROMIAL DECOMPRESSION, release of long head biceps tendon;  Surgeon: Leanor Kail, MD;  Location: [facility];  Service: Orthopedics;  Laterality: Right;  . TOE SURGERY Right 2013  . TONSILLECTOMY  1995  . UPPER GI ENDOSCOPY    . WRIST FRACTURE SURGERY Bilateral     Current Outpatient Medications:  .  acetaminophen (TYLENOL 8 HOUR ARTHRITIS PAIN) 650 MG CR tablet, Take 1,300 mg by mouth every 8 (eight) hours as needed for pain., Disp: , Rfl:  .  alendronate (FOSAMAX) 70 MG tablet, Take 70 mg by mouth every  Monday. Take with a full glass of water on an empty stomach. , Disp: , Rfl:  .  chlorhexidine (PERIDEX) 0.12 % solution, Use as directed 15 mLs in the mouth or throat as needed. , Disp: , Rfl:  .  cholecalciferol (VITAMIN D3) 25 MCG (1000 UT) tablet, Take 1,000 Units by mouth daily., Disp: , Rfl:  .  cyanocobalamin (,VITAMIN B-12,) 1000 MCG/ML injection, Inject 1,000 mcg into the muscle every 30 (thirty) days., Disp: , Rfl:  .  diazepam (VALIUM) 10 MG tablet, Take 1 tablet (10 mg total) by mouth daily as needed (vaginal pain with intercourse, use before sexual activity per vagina). (Patient taking differently: Take 10 mg by mouth as needed (vaginal pain with intercourse, use  before sexual activity per vagina). ), Disp: 30 tablet, Rfl: 0 .  diphenoxylate-atropine (LOMOTIL) 2.5-0.025 MG per tablet, Take 1 tablet by mouth daily. am, Disp: , Rfl:  .  ferrous sulfate 325 (65 FE) MG tablet, Take 1 tablet (325 mg total) by mouth 2 (two) times daily with a meal. (Patient taking differently: Take 325 mg by mouth. 3 times a week), Disp: 60 tablet, Rfl: 0 .  furosemide (LASIX) 20 MG tablet, Take 20 mg by mouth daily. am, Disp: , Rfl:  .  gabapentin (NEURONTIN) 100 MG capsule, Take 100 mg in the morning, Disp: , Rfl:  .  gabapentin (NEURONTIN) 400 MG capsule, Take 400 mg by mouth at bedtime. 100 mg every morning also, Disp: , Rfl:  .  lidocaine (XYLOCAINE) 2 % solution, Use as directed 15 mLs in the mouth or throat as needed for mouth pain., Disp: , Rfl:  .  magnesium oxide (MAG-OX) 400 MG tablet, Take 400 mg by mouth. 3 times per week, Disp: , Rfl:  .  Multiple Vitamin (MULTIVITAMIN WITH MINERALS) TABS tablet, Take 1 tablet by mouth daily., Disp: , Rfl:  .  omeprazole (PRILOSEC) 40 MG capsule, Take 40 mg by mouth daily before breakfast. , Disp: , Rfl:  .  potassium chloride SA (K-DUR,KLOR-CON) 20 MEQ tablet, Take 20 mEq by mouth 3 (three) times a week. , Disp: , Rfl:  .  predniSONE (DELTASONE) 1 MG tablet, Take  3 mg by mouth daily with breakfast. , Disp: , Rfl:  .  Probiotic Product (PROBIOTIC PO), Take 1 capsule by mouth daily. , Disp: , Rfl:  .  rOPINIRole (REQUIP) 1 MG tablet, Take 1 mg by mouth at bedtime as needed (for restless legs). , Disp: , Rfl:  .  sucralfate (CARAFATE) 1 g tablet, Take 0.5 g by mouth 3 (three) times daily with meals as needed (for stomach cramping (crohn's disease)). , Disp: , Rfl:  .  telmisartan (MICARDIS) 40 MG tablet, Take 20 mg by mouth. M< W< FRI, Disp: , Rfl:  .  Tofacitinib Citrate (XELJANZ) 5 MG TABS, Take 5 mg by mouth. Take 2 daily, Disp: , Rfl:  .  traMADol (ULTRAM) 50 MG tablet, Take 1 tablet (50 mg total) by mouth every 6 (six) hours as needed., Disp: 15 tablet, Rfl: 0 .  TRELEGY ELLIPTA 100-62.5-25 MCG/INH AEPB, Inhale 1 puff into the lungs daily., Disp: , Rfl:    Family History  Problem Relation Age of Onset  . Diabetes Mother   . Emphysema Mother   . Emphysema Father   . Colon cancer Maternal Grandfather   . Breast cancer Neg Hx   . Ovarian cancer Neg Hx      Social History   Tobacco Use  . Smoking status: Never Smoker  . Smokeless tobacco: Never Used  Vaping Use  . Vaping Use: Never used  Substance Use Topics  . Alcohol use: No  . Drug use: No    Allergies as of 06/17/2020 - Review Complete 06/17/2020  Allergen Reaction Noted  . Ciprofloxacin Itching and Rash 08/31/2016  . Levofloxacin Hives and Rash 01/08/2014  . Methotrexate  11/21/2017  . Methotrexate derivatives  11/21/2017  . Other Other (See Comments) and Rash 07/16/2013  . Sulfa antibiotics Other (See Comments) 07/16/2013  . Cefazolin Itching and Rash 04/13/2015  . Cefprozil Rash 07/16/2013  . Cephalosporins Itching and Rash 04/13/2015  . Enbrel [etanercept] Itching and Rash 04/13/2015  . Humira [adalimumab] Itching and Rash 08/04/2015  . Hydrocodone-acetaminophen  Itching and Other (See Comments) 04/29/2015  . Lorabid [loracarbef] Itching and Rash 04/13/2015  . Meropenem  Itching and Rash 04/13/2015  . Oxycodone Itching, Other (See Comments), and Rash 09/06/2017  . Oxycodone-acetaminophen Itching 09/06/2017  . Primidone Itching and Rash 04/13/2015    Review of Systems:    All systems reviewed and negative except where noted in HPI.   Physical Exam:  BP 125/79 (BP Location: Left Arm, Patient Position: Sitting, Cuff Size: Normal)   Pulse (!) 102   Temp 97.7 F (36.5 C) (Oral)   Ht 5' 2"  (1.575 m)   Wt 184 lb (83.5 kg)   BMI 33.65 kg/m  No LMP recorded. Patient has had a hysterectomy.  General:   Alert,  Well-developed, well-nourished, pleasant and cooperative in NAD Head:  Normocephalic and atraumatic. Eyes:  Sclera clear, no icterus.   Conjunctiva pink. Ears:  Normal auditory acuity. Nose:  No deformity, discharge, or lesions. Mouth:  No deformity or lesions,oropharynx pink & moist. Neck:  Supple; no masses or thyromegaly. Lungs:  Respirations even and unlabored.  Clear throughout to auscultation.   No wheezes, crackles, or rhonchi. No acute distress. Heart:  Regular rate and rhythm; no murmurs, clicks, rubs, or gallops. Abdomen:  Normal bowel sounds. Soft, non-tender and non-distended without masses, hepatosplenomegaly or hernias noted.  No guarding or rebound tenderness.   Rectal: Not performed Msk:  Symmetrical without gross deformities. Good, equal movement & strength bilaterally. Pulses:  Normal pulses noted. Extremities:  No clubbing or edema.  No cyanosis. Neurologic:  Alert and oriented x3;  grossly normal neurologically. Skin:  Intact without significant lesions or rashes. No jaundice. Lymph Nodes:  No significant cervical adenopathy. Psych:  Alert and cooperative. Normal mood and affect.  Imaging Studies: No recent abdominal imaging  Assessment and Plan:   JADEYN HARGETT is a 69 y.o. female with history of osteoporosis on Fosamax, ?  Small bowel Crohn's with initial presentation of small bowel obstruction s/p terminal ileal  resection with primary anastomosis more than 15 years ago not on maintenance therapy, history of rheumatoid arthritis currently maintained on Xeljanz and low-dose prednisone  Possible Crohn's Obtain most recent colonoscopy report as patient is not willing to undergo repeat colonoscopy at this time Recommend to check fecal calprotectin levels Further recommendations based on above studies Okay to continue Lomotil once a day Patient does not have iron, B12 or folate deficiency  Osteoporosis Most recent DEXA scan 01/09/2020 revealed osteopenia Continue Fosamax Continue calcium and vitamin D supplements   Follow up in 3 months   Cephas Darby, MD

## 2020-06-19 ENCOUNTER — Telehealth: Payer: Self-pay

## 2020-06-19 NOTE — Telephone Encounter (Signed)
-----   Message from Hollice Espy, MD sent at 06/17/2020  4:41 PM EDT ----- Nicole Bass was reviewed.  Stones are essentially unchanged in size and location.  I do not think that the pain that you are experiencing is related to stones.  Hollice Espy, MD

## 2020-06-19 NOTE — Telephone Encounter (Signed)
Pt aware of results 

## 2020-06-23 ENCOUNTER — Ambulatory Visit
Admission: RE | Admit: 2020-06-23 | Discharge: 2020-06-23 | Disposition: A | Discharge status: Home / Self Care | Payer: Medicare Other | Source: Ambulatory Visit | Attending: Internal Medicine | Admitting: Internal Medicine

## 2020-06-23 ENCOUNTER — Other Ambulatory Visit: Payer: Self-pay

## 2020-06-23 DIAGNOSIS — Z1231 Encounter for screening mammogram for malignant neoplasm of breast: Secondary | ICD-10-CM | POA: Insufficient documentation

## 2020-06-24 ENCOUNTER — Encounter: Payer: Self-pay | Admitting: Unknown Physician Specialty

## 2020-06-24 LAB — CALPROTECTIN, FECAL: Calprotectin, Fecal: 65 ug/g (ref 0–120)

## 2020-06-26 ENCOUNTER — Other Ambulatory Visit: Payer: Self-pay

## 2020-06-26 ENCOUNTER — Inpatient Hospital Stay: Payer: Medicare Other | Attending: Oncology

## 2020-06-26 DIAGNOSIS — D509 Iron deficiency anemia, unspecified: Secondary | ICD-10-CM | POA: Insufficient documentation

## 2020-06-26 DIAGNOSIS — G8929 Other chronic pain: Secondary | ICD-10-CM | POA: Insufficient documentation

## 2020-06-26 DIAGNOSIS — K5090 Crohn's disease, unspecified, without complications: Secondary | ICD-10-CM | POA: Insufficient documentation

## 2020-06-26 DIAGNOSIS — D649 Anemia, unspecified: Secondary | ICD-10-CM

## 2020-06-26 LAB — CBC WITH DIFFERENTIAL/PLATELET
Abs Immature Granulocytes: 0.02 10*3/uL (ref 0.00–0.07)
Basophils Absolute: 0 10*3/uL (ref 0.0–0.1)
Basophils Relative: 0 %
Eosinophils Absolute: 0.1 10*3/uL (ref 0.0–0.5)
Eosinophils Relative: 1 %
HCT: 35 % — ABNORMAL LOW (ref 36.0–46.0)
Hemoglobin: 11.7 g/dL — ABNORMAL LOW (ref 12.0–15.0)
Immature Granulocytes: 0 %
Lymphocytes Relative: 10 %
Lymphs Abs: 0.8 10*3/uL (ref 0.7–4.0)
MCH: 32 pg (ref 26.0–34.0)
MCHC: 33.4 g/dL (ref 30.0–36.0)
MCV: 95.6 fL (ref 80.0–100.0)
Monocytes Absolute: 0.9 10*3/uL (ref 0.1–1.0)
Monocytes Relative: 12 %
Neutro Abs: 5.8 10*3/uL (ref 1.7–7.7)
Neutrophils Relative %: 77 %
Platelets: 219 10*3/uL (ref 150–400)
RBC: 3.66 MIL/uL — ABNORMAL LOW (ref 3.87–5.11)
RDW: 12.8 % (ref 11.5–15.5)
WBC: 7.6 10*3/uL (ref 4.0–10.5)
nRBC: 0 % (ref 0.0–0.2)

## 2020-06-26 LAB — FERRITIN: Ferritin: 955 ng/mL — ABNORMAL HIGH (ref 11–307)

## 2020-06-26 LAB — IRON AND TIBC
Iron: 66 ug/dL (ref 28–170)
Saturation Ratios: 22 % (ref 10.4–31.8)
TIBC: 307 ug/dL (ref 250–450)
UIBC: 241 ug/dL

## 2020-06-27 NOTE — Progress Notes (Signed)
[place]  [phone]) [phone] [phone]) [phone]  ID: Nicole Bass OB: 1950/08/22  MR#: [id number]  [facility]#:[id number]  Patient Care Team: [name], MD as PCP - General (Internal Medicine) Lloyd Huger, MD as Consulting Physician (Oncology)  CHIEF COMPLAINT: Iron deficiency anemia.  INTERVAL HISTORY: Patient returns to clinic today for repeat laboratory work, further evaluation, consideration of IV Feraheme.  Her hearing has significantly improved with the hearing aid.  She continues have chronic back pain, but otherwise feels well.  She has no neurologic complaints.  She denies any recent fevers or illnesses.  She has a fair appetite and denies weight loss.  She denies any chest pain, shortness of breath, cough, or hemoptysis.  She denies any nausea, vomiting, constipation, or diarrhea.  She has no melena or hematochezia.  She has no urinary complaints.  Patient offers no further specific complaints today.  REVIEW OF SYSTEMS:   Review of Systems  Constitutional: Negative.  Negative for fever, malaise/fatigue and weight loss.  HENT: Negative for hearing loss.   Respiratory: Negative.  Negative for cough, hemoptysis and shortness of breath.   Cardiovascular: Negative.  Negative for chest pain and leg swelling.  Gastrointestinal: Negative.  Negative for abdominal pain, blood in stool, nausea and vomiting.  Genitourinary: Negative.  Negative for frequency and hematuria.  Musculoskeletal: Positive for back pain. Negative for joint pain.  Skin: Negative.  Negative for rash.  Neurological: Negative.  Negative for dizziness, sensory change, focal weakness, weakness and headaches.  Psychiatric/Behavioral: Negative.  The patient is not nervous/anxious and does not have insomnia.     As per HPI. Otherwise, a complete review of systems is negative.  PAST MEDICAL HISTORY: Past Medical History:  Diagnosis Date  . Anemia    iron everyday, 3 iron  infusions last one August 2020  . Anxiety    nervous  . Arthritis    rheumatoid  . Collagen vascular disease ([place])   . Crohn's disease ([place])   . Crohn's disease ([place])   . DDD (degenerative disc disease), cervical   . DDD (degenerative disc disease), cervical   . DDD (degenerative disc disease), cervical 2003  . DDD (degenerative disc disease), lumbar   . Degenerative disc disease, cervical   . Dyspnea    out of shape  . GERD (gastroesophageal reflux disease)   . Hand weakness    left hand worse, small tremors  . Headache    with accident  . History of claustrophobia   . History of kidney stones   . HOH (hard of hearing)    left ear  . Hypertension     controlled on meds  . Lymphatic disorder   . Meniere's disease   . Motion sickness   . Nasal fracture 03/27/2020   due to fall  . Neuromuscular disorder ([place])   . Neuropathy    legs  . Peripheral neuropathy   . 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 UTI (urinary tract infection)    HO  . Vertigo    can be accompanied by nausea  . Wears dentures    upper dentures    PAST SURGICAL HISTORY: Past Surgical History:  Procedure Laterality Date  . ABDOMINAL HYSTERECTOMY    . APPENDECTOMY    . BACK SURGERY     x 3  . CERVICAL SPINE SURGERY     metal plate  . [place]   X 2  . CHOLECYSTECTOMY    . CLOSED  REDUCTION NASAL FRACTURE N/A 04/09/2020   Procedure: CLOSED REDUCTION NASAL FRACTURE;  Surgeon: Margaretha Sheffield, MD;  Location: [facility];  Service: ENT;  Laterality: N/A;  . [place]   removed 18 inches of colon,removed appendix  . COLONOSCOPY    . dental implants     3 teeth/ wears dentures  . DILATION AND CURETTAGE OF UTERUS  2004  . ELBOW FRACTURE SURGERY Left 2008  . FIBEROPTIC BRONCHOSCOPY N/A 06/01/2020   Procedure: BEDSIDE BRONCHOSCOPY FIBEROPTIC;  Surgeon: Ottie Glazier, MD;  Location: [facility];  Service: Thoracic;  Laterality: N/A;  . FRACTURE SURGERY    . JOINT  REPLACEMENT Right 2015   partial knee replacement  . KYPHOPLASTY    . KYPHOPLASTY N/A 04/14/2015   Procedure: KYPHOPLASTY;  Surgeon: Hessie Knows, MD;  Location: [facility];  Service: Orthopedics;  Laterality: N/A;  . KYPHOPLASTY N/A 06/04/2015   Procedure: KYPHOPLASTY L-5;  Surgeon: Hessie Knows, MD;  Location: [facility];  Service: Orthopedics;  Laterality: N/A;  . MEDIAL PARTIAL KNEE REPLACEMENT Right   . OOPHORECTOMY Bilateral   . ORIF NASAL FRACTURE N/A 04/09/2020   Procedure: OPEN REDUCTION INTERNAL FIXATION (ORIF) NASAL FRACTURE;  Surgeon: Margaretha Sheffield, MD;  Location: [facility];  Service: ENT;  Laterality: N/A;  . REPAIR EXTENSOR TENDON Left 01/08/2019   Procedure: Realignment  EXTENSOR TENDON;  Surgeon: Hessie Knows, MD;  Location: [facility];  Service: Orthopedics;  Laterality: Left;  . REVERSE SHOULDER ARTHROPLASTY Right 04/18/2019   Procedure: REVERSE SHOULDER ARTHROPLASTY;  Surgeon: Corky Mull, MD;  Location: [facility];  Service: Orthopedics;  Laterality: Right;  . SHOULDER ARTHROSCOPY WITH ROTATOR CUFF REPAIR AND SUBACROMIAL DECOMPRESSION Right 04/13/2016   Procedure: SHOULDER ARTHROSCOPY WITH ROTATOR CUFF REPAIR AND SUBACROMIAL DECOMPRESSION, release of long head biceps tendon;  Surgeon: Leanor Kail, MD;  Location: [facility];  Service: Orthopedics;  Laterality: Right;  . TOE SURGERY Right 2013  . TONSILLECTOMY  1995  . UPPER GI ENDOSCOPY    . WRIST FRACTURE SURGERY Bilateral     FAMILY HISTORY: Family History  Problem Relation Age of Onset  . Diabetes Mother   . Emphysema Mother   . Emphysema Father   . Colon cancer Maternal Grandfather   . Breast cancer Neg Hx   . Ovarian cancer Neg Hx     ADVANCED DIRECTIVES (Y/N):  N  HEALTH MAINTENANCE: Social History   Tobacco Use  . Smoking status: Never Smoker  . Smokeless tobacco: Never Used  Vaping Use  . Vaping Use: Never used  Substance Use Topics  . Alcohol use: No  . Drug use: No      Colonoscopy:  PAP:  Bone density:  Lipid panel:  Allergies  Allergen Reactions  . Ciprofloxacin Itching and Rash  . Levofloxacin Hives and Rash  . Methotrexate     Other reaction(s): Other (See Comments) Mouth Ulcers/[place] Mouth and throat ulcers  . Methotrexate Derivatives     Mouth and throat ulcers  . Other Other (See Comments) and Rash    Other reaction(s): Other (See Comments), Unknown Per pts MD she is unable to take this medication because it is contraindicated with her other medications. Per pts MD she is unable to take this medication because it is contraindicated with her other medications.  Other reaction(s): Other (See Comments), Unknown Other reaction(s): Other (See Comments), Unknown Per pts MD she is unable to take this medication because it is contraindicated with her other medications. Per pts MD she is  unable to take this medication because it is contraindicated with her other medications. Other reaction(s): Other (See Comments), Unknown Other reaction(s): Other (See Comments), Unknown Per pts MD she is unable to take this medication because it is contraindicated with her  Per pts MD she is unable to take this medication because it is contraindicated with her other medications. Other reaction(s): Other (See Comments) Other reaction(s): Other (See Comments), Unknown Per pts MD she is unable to take this medication because it is contraindicated with her other medications. Per pts MD she is unable to take this medication because it is contraindicated with her other medications.  Other reaction(s): Other (See Comments), Unknown Other reaction(s): Other (See Comments), Unknown Per pts MD she is unable to take this medication because it is contraindicated with her other medications. Per pts MD she is unable to take this medication because it is contraindicated with her other medications. Other reaction(s): Other (See Comments), Unknown Other  reaction(s): Other (See Comments), Unknown Per pts MD she is unable to take this medication because it is contraindicated with her  Per pts MD she is unable to take this medication because it is contraindicated with her other medications.  . Sulfa Antibiotics Other (See Comments)    Per pts MD she is unable to take this medication because it is contraindicated with her other medications.  . Cefazolin Itching and Rash  . Cefprozil Rash  . Cephalosporins Itching and Rash  . Enbrel [Etanercept] Itching and Rash  . Humira [Adalimumab] Itching and Rash  . Hydrocodone-Acetaminophen Itching and Other (See Comments)    Itching only  . Lorabid [Loracarbef] Itching and Rash  . Meropenem Itching and Rash  . Oxycodone Itching, Other (See Comments) and Rash    Other reaction(s): Other (See Comments)  . Oxycodone-Acetaminophen Itching  . Primidone Itching and Rash    Current Outpatient Medications  Medication Sig Dispense Refill  . acetaminophen (TYLENOL 8 HOUR ARTHRITIS PAIN) 650 MG CR tablet Take 1,300 mg by mouth every 8 (eight) hours as needed for pain.    Marland Kitchen alendronate (FOSAMAX) 70 MG tablet Take 70 mg by mouth every Monday. Take with a full glass of water on an empty stomach.     . chlorhexidine (PERIDEX) 0.12 % solution Use as directed 15 mLs in the mouth or throat as needed.     . cholecalciferol (VITAMIN D3) 25 MCG (1000 UT) tablet Take 1,000 Units by mouth daily.    . cyanocobalamin (,VITAMIN B-12,) 1000 MCG/ML injection Inject 1,000 mcg into the muscle every 30 (thirty) days.    . diazepam (VALIUM) 10 MG tablet Take 1 tablet (10 mg total) by mouth daily as needed (vaginal pain with intercourse, use before sexual activity per vagina). (Patient taking differently: Take 10 mg by mouth as needed (vaginal pain with intercourse, use before sexual activity per vagina). ) 30 tablet 0  . diphenoxylate-atropine (LOMOTIL) 2.5-0.025 MG per tablet Take 1 tablet by mouth daily. am    . ferrous  sulfate 325 (65 FE) MG tablet Take 1 tablet (325 mg total) by mouth 2 (two) times daily with a meal. (Patient taking differently: Take 325 mg by mouth. 3 times a week) 60 tablet 0  . furosemide (LASIX) 20 MG tablet Take 20 mg by mouth daily. am    . gabapentin (NEURONTIN) 100 MG capsule Take 100 mg in the morning    . gabapentin (NEURONTIN) 400 MG capsule Take 400 mg by mouth at bedtime. 100 mg every morning also    .  lidocaine (XYLOCAINE) 2 % solution Use as directed 15 mLs in the mouth or throat as needed for mouth pain.    . magnesium oxide (MAG-OX) 400 MG tablet Take 400 mg by mouth. 3 times per week    . Multiple Vitamin (MULTIVITAMIN WITH MINERALS) TABS tablet Take 1 tablet by mouth daily.    Marland Kitchen omeprazole (PRILOSEC) 40 MG capsule Take 40 mg by mouth daily before breakfast.     . potassium chloride SA (K-DUR,KLOR-CON) 20 MEQ tablet Take 20 mEq by mouth 3 (three) times a week.     . predniSONE (DELTASONE) 1 MG tablet Take 3 mg by mouth daily with breakfast.     . Probiotic Product (PROBIOTIC PO) Take 1 capsule by mouth daily.     Marland Kitchen rOPINIRole (REQUIP) 1 MG tablet Take 1 mg by mouth at bedtime as needed (for restless legs).     . sucralfate (CARAFATE) 1 g tablet Take 0.5 g by mouth 3 (three) times daily with meals as needed (for stomach cramping (crohn's disease)).     Marland Kitchen telmisartan (MICARDIS) 40 MG tablet Take 20 mg by mouth. M< W< FRI    . Tofacitinib Citrate (XELJANZ) 5 MG TABS Take 5 mg by mouth. Take 2 daily    . traMADol (ULTRAM) 50 MG tablet Take 1 tablet (50 mg total) by mouth every 6 (six) hours as needed. 15 tablet 0  . TRELEGY ELLIPTA 100-62.5-25 MCG/INH AEPB Inhale 1 puff into the lungs daily. (Patient not taking: Reported on 06/29/2020)     No current facility-administered medications for this visit.    OBJECTIVE: Vitals:   06/29/20 1320  BP: 109/70  Pulse: 81  Resp: 20  Temp: 97.8 F (36.6 C)  SpO2: 98%     Body mass index is 33.36 kg/m.    ECOG FS:0 -  Asymptomatic  General: Well-developed, well-nourished, no acute distress. Eyes: Pink conjunctiva, anicteric sclera. HEENT: Normocephalic, moist mucous membranes. Lungs: No audible wheezing or coughing. Heart: Regular rate and rhythm. Abdomen: Soft, nontender, no obvious distention. Musculoskeletal: No edema, cyanosis, or clubbing. Neuro: Alert, answering all questions appropriately. Cranial nerves grossly intact. Skin: No rashes or petechiae noted. Psych: Normal affect.   LAB RESULTS:  Lab Results  Component Value Date   NA 140 06/01/2020   K 3.5 06/01/2020   CL 101 06/01/2020   CO2 22 04/19/2019   GLUCOSE 96 06/01/2020   BUN 14 06/01/2020   CREATININE 0.90 06/01/2020   CALCIUM 8.1 (L) 04/19/2019   PROT 6.6 08/23/2018   ALBUMIN 3.4 (L) 08/23/2018   AST 27 08/23/2018   ALT 20 08/23/2018   ALKPHOS 58 08/23/2018   BILITOT 0.6 08/23/2018   GFRNONAA 55 (L) 04/19/2019   GFRAA >60 04/19/2019    Lab Results  Component Value Date   WBC 7.6 06/26/2020   NEUTROABS 5.8 06/26/2020   HGB 11.7 (L) 06/26/2020   HCT 35.0 (L) 06/26/2020   MCV 95.6 06/26/2020   PLT 219 06/26/2020   Lab Results  Component Value Date   IRON 66 06/26/2020   TIBC 307 06/26/2020   IRONPCTSAT 22 06/26/2020   Lab Results  Component Value Date   FERRITIN 955 (H) 06/26/2020     STUDIES: Abdomen 1 view (KUB)  Result Date: 06/17/2020 CLINICAL DATA:  Nephrolithiasis, bilateral flank pain, radiating to lower back. EXAM: ABDOMEN - 1 VIEW COMPARISON:  CT abdomen pelvis 09/04/2015, x-ray abdomen 02/22/2019. FINDINGS: The bowel gas pattern is normal. Redemonstration of a oval 1 cm amorphous calcific  density overlying the superior pole of the right renal shadow and a curvilinear 0.9 cm amorphous calcific density overlying the expected region of the left renal shadow. Punctate calcific density overlies the inferior pole of the right renal shadow. Phleboliths noted overlying the pelvis. Right upper quadrant  cholecystectomy clips. No acute displaced fracture of the visualized osseous structures. L1 and L5 kyphoplasty again noted. IMPRESSION: 1. Similar-appearing bilateral pericentimeter calcific densities as well as a punctate calcific density overlying the expected regions of the kidneys that could represent nephrolithiasis. 2. Nonobstructive bowel gas pattern. 3. Status post cholecystectomy. Electronically Signed   By: Iven Finn M.D.   On: 06/17/2020 16:34   MM 3D SCREEN BREAST BILATERAL  Result Date: 06/24/2020 CLINICAL DATA:  Screening. EXAM: DIGITAL SCREENING BILATERAL MAMMOGRAM WITH TOMO AND CAD COMPARISON:  Previous exam(s). ACR Breast Density Category d: The breast tissue is extremely dense, which lowers the sensitivity of mammography FINDINGS: There are no findings suspicious for malignancy. Images were processed with CAD. IMPRESSION: No mammographic evidence of malignancy. A result letter of this screening mammogram will be mailed directly to the patient. RECOMMENDATION: Screening mammogram in one year. (Code:SM-B-01Y) BI-RADS CATEGORY  1: Negative. Electronically Signed   By: Audie Pinto M.D.   On: 06/24/2020 15:32    ASSESSMENT: Iron deficiency anemia.  PLAN:   1.  Iron deficiency anemia: Patient's hemoglobin has trended up and is now nearly within normal limits at 11.7.  Her iron stores also remain within normal limits. Ferritin remains chronically elevated and this is likely a false positive secondary to acute phase reactant.  She does not require additional IV Feraheme today.  She last received 510 mg IV Feraheme on June 18, 2019.  No intervention is needed at this time.  After discussion with the patient, is agreed upon that no further follow-up is necessary.  Please refer patient back if there are any questions or concerns. 2.  Crohn's disease: Patient reports her most recent flare was in May 2021.  Continue follow-up and treatment with GI.   3.  Elevated ferritin: Chronic  and unchanged.  Falsely elevated secondary to acute phase reactant. 4.  Back pain: Chronic and unchanged.  Continue follow-up with primary care as indicated.   Patient expressed understanding and was in agreement with this plan. She also understands that She can call clinic at any time with any questions, concerns, or complaints.    Lloyd Huger, MD   07/02/2020 6:46 AM

## 2020-06-29 ENCOUNTER — Other Ambulatory Visit: Payer: Self-pay

## 2020-06-29 ENCOUNTER — Inpatient Hospital Stay: Payer: Medicare Other

## 2020-06-29 ENCOUNTER — Encounter: Payer: Self-pay | Admitting: Oncology

## 2020-06-29 ENCOUNTER — Inpatient Hospital Stay (HOSPITAL_BASED_OUTPATIENT_CLINIC_OR_DEPARTMENT_OTHER): Payer: Medicare Other | Admitting: Oncology

## 2020-06-29 VITALS — BP 109/70 | HR 81 | Temp 97.8°F | Resp 20 | Wt 182.4 lb

## 2020-06-29 DIAGNOSIS — D509 Iron deficiency anemia, unspecified: Secondary | ICD-10-CM | POA: Diagnosis not present

## 2020-07-02 ENCOUNTER — Telehealth: Payer: Self-pay

## 2020-07-02 NOTE — Telephone Encounter (Signed)
Patient called to get her result from her stool test. Gave her the result and she verbalized understanding of results. She said she did not have the colonoscopy report and will have a colonoscopy sometime next year

## 2020-07-06 LAB — FUNGUS CULTURE WITH STAIN

## 2020-07-06 LAB — FUNGAL ORGANISM REFLEX

## 2020-07-06 LAB — FUNGUS CULTURE RESULT

## 2020-07-07 ENCOUNTER — Ambulatory Visit (INDEPENDENT_AMBULATORY_CARE_PROVIDER_SITE_OTHER): Payer: Medicare Other

## 2020-07-07 ENCOUNTER — Encounter (INDEPENDENT_AMBULATORY_CARE_PROVIDER_SITE_OTHER): Payer: Self-pay | Admitting: Vascular Surgery

## 2020-07-07 ENCOUNTER — Other Ambulatory Visit: Payer: Self-pay

## 2020-07-07 ENCOUNTER — Ambulatory Visit (INDEPENDENT_AMBULATORY_CARE_PROVIDER_SITE_OTHER): Payer: Medicare Other | Admitting: Vascular Surgery

## 2020-07-07 VITALS — BP 115/70 | HR 93 | Resp 16 | Wt 182.4 lb

## 2020-07-07 DIAGNOSIS — M7989 Other specified soft tissue disorders: Secondary | ICD-10-CM

## 2020-07-07 DIAGNOSIS — I10 Essential (primary) hypertension: Secondary | ICD-10-CM | POA: Diagnosis not present

## 2020-07-07 DIAGNOSIS — I83812 Varicose veins of left lower extremities with pain: Secondary | ICD-10-CM | POA: Diagnosis not present

## 2020-07-07 DIAGNOSIS — R600 Localized edema: Secondary | ICD-10-CM | POA: Diagnosis not present

## 2020-07-07 DIAGNOSIS — E1140 Type 2 diabetes mellitus with diabetic neuropathy, unspecified: Secondary | ICD-10-CM | POA: Diagnosis not present

## 2020-07-07 NOTE — Progress Notes (Signed)
MRN : [id number]  Nicole Bass is a 69 y.o. ([birth year]) female who presents with chief complaint of  Chief Complaint  Patient presents with  . Follow-up    ultrasound follow up  .  History of Present Illness: Patient returns today in follow up of her left leg pain and swelling.  Despite the regular use of compression stockings, elevating her legs, and using anti-inflammatories as needed for discomfort, her left leg continues to bother her.  The swelling may be slightly better with these conservative measures, but not dramatically so.  It still swells every day.  It is worse at the end of the day.  It becomes heavy and tired and harder to walk.  Her noninvasive study today shows left great saphenous vein reflux including the saphenofemoral junction and the deep venous system near the saphenofemoral junction.  No DVT or superficial thrombophlebitis was identified.  Current Outpatient Medications  Medication Sig Dispense Refill  . acetaminophen (TYLENOL 8 HOUR ARTHRITIS PAIN) 650 MG CR tablet Take 1,300 mg by mouth every 8 (eight) hours as needed for pain.    Marland Kitchen alendronate (FOSAMAX) 70 MG tablet Take 70 mg by mouth every Monday. Take with a full glass of water on an empty stomach.     . chlorhexidine (PERIDEX) 0.12 % solution Use as directed 15 mLs in the mouth or throat as needed.     . cholecalciferol (VITAMIN D3) 25 MCG (1000 UT) tablet Take 1,000 Units by mouth daily.    . cyanocobalamin (,VITAMIN B-12,) 1000 MCG/ML injection Inject 1,000 mcg into the muscle every 30 (thirty) days.    . diazepam (VALIUM) 10 MG tablet Take 1 tablet (10 mg total) by mouth daily as needed (vaginal pain with intercourse, use before sexual activity per vagina). (Patient taking differently: Take 10 mg by mouth as needed (vaginal pain with intercourse, use before sexual activity per vagina). ) 30 tablet 0  . diphenoxylate-atropine (LOMOTIL) 2.5-0.025 MG per tablet Take 1 tablet by mouth daily. am    . ferrous  sulfate 325 (65 FE) MG tablet Take 1 tablet (325 mg total) by mouth 2 (two) times daily with a meal. (Patient taking differently: Take 325 mg by mouth. 3 times a week) 60 tablet 0  . furosemide (LASIX) 20 MG tablet Take 20 mg by mouth daily. am    . gabapentin (NEURONTIN) 100 MG capsule Take 100 mg in the morning    . gabapentin (NEURONTIN) 400 MG capsule Take 400 mg by mouth at bedtime. 100 mg every morning also    . lidocaine (XYLOCAINE) 2 % solution Use as directed 15 mLs in the mouth or throat as needed for mouth pain.    . magnesium oxide (MAG-OX) 400 MG tablet Take 400 mg by mouth. 3 times per week    . Multiple Vitamin (MULTIVITAMIN WITH MINERALS) TABS tablet Take 1 tablet by mouth daily.    Marland Kitchen omeprazole (PRILOSEC) 40 MG capsule Take 40 mg by mouth daily before breakfast.     . potassium chloride SA (K-DUR,KLOR-CON) 20 MEQ tablet Take 20 mEq by mouth 3 (three) times a week.     . predniSONE (DELTASONE) 1 MG tablet Take 3 mg by mouth daily with breakfast.     . Probiotic Product (PROBIOTIC PO) Take 1 capsule by mouth daily.     Marland Kitchen rOPINIRole (REQUIP) 1 MG tablet Take 1 mg by mouth at bedtime as needed (for restless legs).     . sucralfate (CARAFATE) 1  g tablet Take 0.5 g by mouth 3 (three) times daily with meals as needed (for stomach cramping (crohn's disease)).     Marland Kitchen telmisartan (MICARDIS) 40 MG tablet Take 20 mg by mouth. M< W< FRI    . Tofacitinib Citrate (XELJANZ) 5 MG TABS Take 5 mg by mouth. Take 2 daily    . traMADol (ULTRAM) 50 MG tablet Take 1 tablet (50 mg total) by mouth every 6 (six) hours as needed. 15 tablet 0  . TRELEGY ELLIPTA 100-62.5-25 MCG/INH AEPB Inhale 1 puff into the lungs daily. (Patient not taking: Reported on 06/29/2020)     No current facility-administered medications for this visit.    Past Medical History:  Diagnosis Date  . Anemia    iron everyday, 3 iron infusions last one August 2020  . Anxiety    nervous  . Arthritis    rheumatoid  . Collagen  vascular disease ([place])   . Crohn's disease ([place])   . Crohn's disease ([place])   . DDD (degenerative disc disease), cervical   . DDD (degenerative disc disease), cervical   . DDD (degenerative disc disease), cervical 2003  . DDD (degenerative disc disease), lumbar   . Degenerative disc disease, cervical   . Dyspnea    out of shape  . GERD (gastroesophageal reflux disease)   . Hand weakness    left hand worse, small tremors  . Headache    with accident  . History of claustrophobia   . History of kidney stones   . HOH (hard of hearing)    left ear  . Hypertension     controlled on meds  . Lymphatic disorder   . Meniere's disease   . Motion sickness   . Nasal fracture 03/27/2020   due to fall  . Neuromuscular disorder ([place])   . Neuropathy    legs  . Peripheral neuropathy   . 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 UTI (urinary tract infection)    HO  . Vertigo    can be accompanied by nausea  . Wears dentures    upper dentures    Past Surgical History:  Procedure Laterality Date  . ABDOMINAL HYSTERECTOMY    . APPENDECTOMY    . BACK SURGERY     x 3  . CERVICAL SPINE SURGERY     metal plate  . [place]   X 2  . CHOLECYSTECTOMY    . CLOSED REDUCTION NASAL FRACTURE N/A 04/09/2020   Procedure: CLOSED REDUCTION NASAL FRACTURE;  Surgeon: Margaretha Sheffield, MD;  Location: [facility];  Service: ENT;  Laterality: N/A;  . [place]   removed 18 inches of colon,removed appendix  . COLONOSCOPY    . dental implants     3 teeth/ wears dentures  . DILATION AND CURETTAGE OF UTERUS  2004  . ELBOW FRACTURE SURGERY Left 2008  . FIBEROPTIC BRONCHOSCOPY N/A 06/01/2020   Procedure: BEDSIDE BRONCHOSCOPY FIBEROPTIC;  Surgeon: Ottie Glazier, MD;  Location: [facility];  Service: Thoracic;  Laterality: N/A;  . FRACTURE SURGERY    . JOINT REPLACEMENT Right 2015   partial knee replacement  . KYPHOPLASTY    . KYPHOPLASTY N/A 04/14/2015   Procedure: KYPHOPLASTY;   Surgeon: Hessie Knows, MD;  Location: [facility];  Service: Orthopedics;  Laterality: N/A;  . KYPHOPLASTY N/A 06/04/2015   Procedure: KYPHOPLASTY L-5;  Surgeon: Hessie Knows, MD;  Location: [facility];  Service: Orthopedics;  Laterality: N/A;  . MEDIAL PARTIAL KNEE  REPLACEMENT Right   . OOPHORECTOMY Bilateral   . ORIF NASAL FRACTURE N/A 04/09/2020   Procedure: OPEN REDUCTION INTERNAL FIXATION (ORIF) NASAL FRACTURE;  Surgeon: Margaretha Sheffield, MD;  Location: [facility];  Service: ENT;  Laterality: N/A;  . REPAIR EXTENSOR TENDON Left 01/08/2019   Procedure: Realignment  EXTENSOR TENDON;  Surgeon: Hessie Knows, MD;  Location: [facility];  Service: Orthopedics;  Laterality: Left;  . REVERSE SHOULDER ARTHROPLASTY Right 04/18/2019   Procedure: REVERSE SHOULDER ARTHROPLASTY;  Surgeon: Corky Mull, MD;  Location: [facility];  Service: Orthopedics;  Laterality: Right;  . SHOULDER ARTHROSCOPY WITH ROTATOR CUFF REPAIR AND SUBACROMIAL DECOMPRESSION Right 04/13/2016   Procedure: SHOULDER ARTHROSCOPY WITH ROTATOR CUFF REPAIR AND SUBACROMIAL DECOMPRESSION, release of long head biceps tendon;  Surgeon: Leanor Kail, MD;  Location: [facility];  Service: Orthopedics;  Laterality: Right;  . TOE SURGERY Right 2013  . TONSILLECTOMY  1995  . UPPER GI ENDOSCOPY    . WRIST FRACTURE SURGERY Bilateral      Social History   Tobacco Use  . Smoking status: Never Smoker  . Smokeless tobacco: Never Used  Vaping Use  . Vaping Use: Never used  Substance Use Topics  . Alcohol use: No  . Drug use: No      Family History  Problem Relation Age of Onset  . Diabetes Mother   . Emphysema Mother   . Emphysema Father   . Colon cancer Maternal Grandfather   . Breast cancer Neg Hx   . Ovarian cancer Neg Hx      Allergies  Allergen Reactions  . Ciprofloxacin Itching and Rash  . Levofloxacin Hives and Rash  . Methotrexate     Other reaction(s): Other (See Comments) Mouth Ulcers/[place] Mouth and throat ulcers  .  Methotrexate Derivatives     Mouth and throat ulcers  . Other Other (See Comments) and Rash    Other reaction(s): Other (See Comments), Unknown Per pts MD she is unable to take this medication because it is contraindicated with her other medications. Per pts MD she is unable to take this medication because it is contraindicated with her other medications.  Other reaction(s): Other (See Comments), Unknown Other reaction(s): Other (See Comments), Unknown Per pts MD she is unable to take this medication because it is contraindicated with her other medications. Per pts MD she is unable to take this medication because it is contraindicated with her other medications. Other reaction(s): Other (See Comments), Unknown Other reaction(s): Other (See Comments), Unknown Per pts MD she is unable to take this medication because it is contraindicated with her  Per pts MD she is unable to take this medication because it is contraindicated with her other medications. Other reaction(s): Other (See Comments) Other reaction(s): Other (See Comments), Unknown Per pts MD she is unable to take this medication because it is contraindicated with her other medications. Per pts MD she is unable to take this medication because it is contraindicated with her other medications.  Other reaction(s): Other (See Comments), Unknown Other reaction(s): Other (See Comments), Unknown Per pts MD she is unable to take this medication because it is contraindicated with her other medications. Per pts MD she is unable to take this medication because it is contraindicated with her other medications. Other reaction(s): Other (See Comments), Unknown Other reaction(s): Other (See Comments), Unknown Per pts MD she is unable to take this medication because it is contraindicated with her  Per pts MD she is unable to take this medication because  it is contraindicated with her other medications.  . Sulfa Antibiotics Other  (See Comments)    Per pts MD she is unable to take this medication because it is contraindicated with her other medications.  . Cefazolin Itching and Rash  . Cefprozil Rash  . Cephalosporins Itching and Rash  . Enbrel [Etanercept] Itching and Rash  . Humira [Adalimumab] Itching and Rash  . Hydrocodone-Acetaminophen Itching and Other (See Comments)    Itching only  . Lorabid [Loracarbef] Itching and Rash  . Meropenem Itching and Rash  . Oxycodone Itching, Other (See Comments) and Rash    Other reaction(s): Other (See Comments)  . Oxycodone-Acetaminophen Itching  . Primidone Itching and Rash     REVIEW OF SYSTEMS (Negative unless checked)  Constitutional: _0 Weight loss  _1 Fever  _2 Chills Cardiac: _3 Chest pain   _4 Chest pressure   _5 Palpitations   _6 Shortness of breath when laying flat   _7 Shortness of breath at rest   _8 Shortness of breath with exertion. Vascular:  _9 Pain in legs with walking   _10 Pain in legs at rest   _11 Pain in legs when laying flat   _12 Claudication   _13 Pain in feet when walking  _14 Pain in feet at rest  _15 Pain in feet when laying flat   _16 History of DVT   _17 Phlebitis   _18 Swelling in legs   _19 Varicose veins   _20 Non-healing ulcers Pulmonary:   _21 Uses home oxygen   _22 Productive cough   _23 Hemoptysis   _24 Wheeze  _25 COPD   _26 Asthma Neurologic:  _27 Dizziness  _28 Blackouts   _29 Seizures   _30 History of stroke   _31 History of TIA  _32 Aphasia   _33 Temporary blindness   _34 Dysphagia   _35 Weakness or numbness in arms   _36 Weakness or numbness in legs Musculoskeletal:  _37 Arthritis   _38 Joint swelling   _39 Joint pain   _40 Low back pain Hematologic:  _41 Easy bruising  _42 Easy bleeding   _43 Hypercoagulable state   _44 Anemic   Gastrointestinal:  _45 Blood in stool   _46 Vomiting blood  _47 Gastroesophageal reflux/heartburn   _48 Abdominal pain Genitourinary:  _49 Chronic kidney disease   _50 Difficult urination  _51 Frequent urination  _52 Burning with urination   _53 Hematuria Skin:  _54 Rashes   _55 Ulcers    _56 Wounds Psychological:  _57 History of anxiety   _58  History of major depression.  Physical Examination  BP 115/70 (BP Location: Right Arm)   Pulse 93   Resp 16   Wt 182 lb 6.4 oz (82.7 kg)   BMI 33.36 kg/m  Gen:  WD/WN, NAD Head: [place]/AT, No temporalis wasting. Ear/Nose/Throat: Hearing grossly intact, nares w/o erythema or drainage Eyes: Conjunctiva clear. Sclera non-icteric Neck: Supple.  Trachea midline Pulmonary:  Good air movement, no use of accessory muscles.  Cardiac: RRR, no JVD Vascular:  Vessel Right Left  Radial Palpable Palpable                          PT Palpable Palpable  DP Palpable Palpable   Gastrointestinal: soft, non-tender/non-distended. No guarding/reflex.  Musculoskeletal: M/S 5/5 throughout.  No deformity or atrophy.  1+ left lower extremity edema. Neurologic: Sensation grossly intact in extremities.  Symmetrical.  Speech is fluent.  Psychiatric: Judgment intact, Mood & affect appropriate for pt's clinical situation. Dermatologic: No rashes or ulcers noted.  No cellulitis or open wounds.       Labs Recent Results (from the past 2160 hour(s))  SARS CORONAVIRUS 2 (TAT 6-24 HRS) Nasopharyngeal Nasopharyngeal Swab     Status: None   Collection Time: 05/29/20 11:05 AM  Specimen: Nasopharyngeal Swab  Result Value Ref Range   SARS Coronavirus 2 NEGATIVE NEGATIVE    Comment: (NOTE) SARS-CoV-2 target nucleic acids are NOT DETECTED.  The SARS-CoV-2 RNA is generally detectable in upper and lower respiratory specimens during the acute phase of infection. Negative results do not preclude SARS-CoV-2 infection, do not rule out co-infections with other pathogens, and should not be used as the sole basis for treatment or other patient management decisions. Negative results must be combined with clinical observations, patient history, and epidemiological information. The expected result is Negative.  Fact Sheet for  Patients: SugarRoll.be  Fact Sheet for Healthcare Providers: https://www.woods-mathews.com/  This test is not yet approved or cleared by the [place] FDA and  has been authorized for detection and/or diagnosis of SARS-CoV-2 by FDA under an Emergency Use Authorization (EUA). This EUA will remain  in effect (meaning this test can be used) for the duration of the COVID-19 declaration under Se ction 564(b)(1) of the Act, 21 U.S.C. section 360bbb-3(b)(1), unless the authorization is terminated or revoked sooner.  Performed at [facility], [place] [address], [place], [place] [address]   APTT     Status: None   Collection Time: 05/29/20 11:05 AM  Result Value Ref Range   aPTT 25 24 - 36 seconds    Comment: Performed at [facility], [address] [place], [place] [address]  Protime-INR     Status: None   Collection Time: 05/29/20 11:05 AM  Result Value Ref Range   Prothrombin Time 12.6 11.4 - 15.2 seconds   INR 1.0 0.8 - 1.2    Comment: (NOTE) INR goal varies based on device and disease states. Performed at [facility], [address] [place], [place] [address]   Potassium     Status: Abnormal   Collection Time: 05/29/20 11:05 AM  Result Value Ref Range   Potassium 3.2 (L) 3.5 - 5.1 mmol/L    Comment: Performed at [facility], [address] [place], [place] [address]  [place], [place] [address]     Status: None   Collection Time: 06/01/20  1:33 PM  Result Value Ref Range   Sodium 140 135 - 145 mmol/L   Potassium 3.5 3.5 - 5.1 mmol/L   Chloride 101 98 - 111 mmol/L   BUN 14 8 - 23 mg/dL   Creatinine, Ser 0.90 0.44 - 1.00 mg/dL   Glucose, Bld 96 70 - 99 mg/dL    Comment: Glucose reference range applies only to samples taken after fasting for at least 8 hours.   Calcium, Ion 1.18 1.15 - 1.40 mmol/L   TCO2 26 22 - 32 mmol/L   Hemoglobin 12.9 12.0 - 15.0 g/dL   HCT 38.0 36 - 46 %  KOH prep     Status: None    Collection Time: 06/01/20  3:29 PM   Specimen: Bronchoalveolar Lavage  Result Value Ref Range   Specimen Description BRONCHIAL ALVEOLAR LAVAGE    Special Requests NONE    KOH Prep      NO YEAST OR FUNGAL ELEMENTS SEEN Performed at [facility], [address] [place], [place] [address]    Report Status 06/02/2020 FINAL   Culture, respiratory     Status: None   Collection Time: 06/01/20  3:29 PM   Specimen: Bronchoalveolar Lavage; Respiratory  Result Value Ref Range   Specimen Description      BRONCHIAL ALVEOLAR LAVAGE Performed at [facility], [place],  [place] [address]    Special Requests      NONE Performed at [facility], [place], [place] [address]    Gram Stain NO WBC SEEN NO ORGANISMS SEEN     Culture      NO GROWTH 2 DAYS Performed at [facility], [place] [address] [place], [place] [address]    Report Status 06/04/2020 FINAL   Aspergillus Ag, BAL/Serum     Status: None   Collection Time: 06/01/20  3:29 PM   Specimen: Bronchial Alveolar Lavage; Respiratory  Result Value Ref Range   Aspergillus Ag, BAL/Serum 0.06 0.00 - 0.49 Index    Comment: (NOTE) Performed At: [facility] [place], [place] [id number] Rush Farmer MD [id number]   Acid Fast Smear (AFB)     Status: None   Collection Time: 06/01/20  3:29 PM   Specimen: Bronchoalveolar Lavage; Respiratory  Result Value Ref Range   AFB Specimen Processing Concentration    Acid Fast Smear Negative     Comment: (NOTE) Performed At: [facility] [place], [place] [id number] Rush Farmer MD [id number]    Source (AFB) BRONCHIAL ALVEOLAR LAVAGE     Comment: Performed at [facility], [address] [place], [place] [id number]  CMV dna by pcr, qualitative     Status: None   Collection Time: 06/01/20  3:29 PM  Result Value Ref Range   CMV DNA, Qual PCR [place]     Comment:  (NOTE) Test not performed. The required specimen for the test ordered was not received.      Received BAL.      Requires whole blood, plasma, CSF, or urine      Quinisha _0  notified 06/04/2020 This test was developed and its performance characteristics determined by [facility].  It has not been cleared or approved by the Food and Drug Administration.  The FDA has determined that such clearance or approval is not necessary. Performed At: [facility] [place], [place] [id number] Rush Farmer MD [id number]   Pneumocystis PCR     Status: None   Collection Time: 06/01/20  3:29 PM  Result Value Ref Range   Result Pneumocystis PCR Negative     Comment: (NOTE) -------------------ADDITIONAL INFORMATION------------------- This test was developed and its performance characteristics determined by [facility] in a manner consistent with CLIA requirements. This test has not been cleared or approved by the U.S. Food and Drug Administration. REFERENCE RANGE: Not Applicable    Specimen Source Comment     Comment: (NOTE) BRONCHOALVEOLAR LAVAGE Performed At: [facility] [place], [place] [id number] Julieta Bellini ER:[id number]    Source of Sample BRONCHIAL ALVEOLAR LAVAGE     Comment: Performed at [facility], [address] [place], [place] [address]  Fungus Culture With Stain     Status: Abnormal   Collection Time: 06/01/20  3:29 PM  Result Value Ref Range   Fungus Stain Final report    Fungus (Mycology) Culture Final report (A)     Comment: (NOTE) Performed At: [facility] [place], [place] [id number] Rush Farmer MD DX:[id number] CORRECTED ON 11/22 AT 0936: PREVIOUSLY REPORTED AS Preliminary report    Fungal Source BRONCHIAL ALVEOLAR LAVAGE     Comment: Performed at [facility], [place] [address] [place], [place] [address]  Fungus Culture Result     Status: None   Collection Time:  06/01/20  3:29 PM  Result Value Ref Range   Result 1 Comment     Comment: (NOTE) KOH/Calcofluor preparation:  no fungus observed. Performed At: [facility] [place], [place] [id number] Rush Farmer MD [id number]   Miscellaneous [facility] test (send-out)     Status: None   Collection Time: 06/01/20  3:29 PM  Result Value Ref Range   [facility] test code 601093    [facility] test name CMV qPCR BAL    Source ([facility]) BRONCHIAL ALVEOLAR LAVAGE    Misc [facility] result See Scanned report in [place]     Comment: Performed at [facility], [place] [address] [place], [place] [address]  Fungal organism reflex     Status: Abnormal   Collection Time: 06/01/20  3:29 PM  Result Value Ref Range   Fungal result 1 Comment (A)     Comment: (NOTE) Cryptococcus neoformans Light growth Performed At: [facility] [place], [place] [id number] Rush Farmer MD CW:[id number]   Troponin I (High Sensitivity)     Status: None   Collection Time: 06/04/20  2:31 PM  Result Value Ref Range   Troponin I (High Sensitivity) 6 <18 ng/L    Comment: (NOTE) Elevated high sensitivity troponin I (hsTnI) values and significant  changes across serial measurements may suggest ACS but many other  chronic and acute conditions are known to elevate hsTnI results.  Refer to the "Links" section for chest pain algorithms and additional  guidance. Performed at [facility], [address] [place], [place] [address]   Urinalysis, Complete     Status: None   Collection Time: 06/16/20  9:15 AM  Result Value Ref Range   Specific Gravity, UA 1.020 1.005 - 1.030   pH, UA 6.0 5.0 - 7.5   Color, UA Yellow Yellow   Appearance Ur Clear Clear   Leukocytes,UA Negative Negative   Protein,UA Negative Negative/Trace   Glucose, UA Negative Negative   Ketones, UA Negative Negative   RBC, UA Negative Negative   Bilirubin, UA Negative Negative   Urobilinogen, Ur 0.2  0.2 - 1.0 mg/dL   Nitrite, UA Negative Negative   Microscopic Examination See below:   Microscopic Examination     Status: Abnormal   Collection Time: 06/16/20  9:15 AM   Urine  Result Value Ref Range   WBC, UA 0-5 0 - 5 /hpf   RBC 0-2 0 - 2 /hpf   Epithelial Cells (non renal) 0-10 0 - 10 /hpf   Renal Epithel, UA 0-10 (A) None seen /hpf   Bacteria, UA None seen None seen/Few  BLADDER SCAN AMB NON-IMAGING     Status: None   Collection Time: 06/16/20  9:29 AM  Result Value Ref Range   Scan Result 53m   Calprotectin, Fecal     Status: None   Collection Time: 06/22/20  1:36 PM   Specimen: Stool  Result Value Ref Range   Calprotectin, Fecal 65 0 - 120 ug/g    Comment: Concentration     Interpretation   Follow-Up <16 - 50 ug/g     Normal           None >50 -120 ug/g     Borderline       Re-evaluate in 4-6 weeks     >120 ug/g     Abnormal         Repeat as clinically  indicated   CBC with Differential     Status: Abnormal   Collection Time: 06/26/20  1:41 PM  Result Value Ref Range   WBC 7.6 4.0 - 10.5 K/uL   RBC 3.66 (L) 3.87 - 5.11 MIL/uL   Hemoglobin 11.7 (L) 12.0 - 15.0 g/dL   HCT 35.0 (L) 36 - 46 %   MCV 95.6 80.0 - 100.0 fL   MCH 32.0 26.0 - 34.0 pg   MCHC 33.4 30.0 - 36.0 g/dL   RDW 12.8 11.5 - 15.5 %   Platelets 219 150 - 400 K/uL   nRBC 0.0 0.0 - 0.2 %   Neutrophils Relative % 77 %   Neutro Abs 5.8 1.7 - 7.7 K/uL   Lymphocytes Relative 10 %   Lymphs Abs 0.8 0.7 - 4.0 K/uL   Monocytes Relative 12 %   Monocytes Absolute 0.9 0.1 - 1.0 K/uL   Eosinophils Relative 1 %   Eosinophils Absolute 0.1 0.0 - 0.5 K/uL   Basophils Relative 0 %   Basophils Absolute 0.0 0.0 - 0.1 K/uL   Immature Granulocytes 0 %   Abs Immature Granulocytes 0.02 0.00 - 0.07 K/uL    Comment: Performed at [facility], [address] [place], [place] [address]  Ferritin     Status: Abnormal   Collection Time: 06/26/20  1:41 PM  Result Value Ref Range    Ferritin 955 (H) 11 - 307 ng/mL    Comment: Performed at [facility], [address] [place], [place] [address]  Iron and TIBC     Status: None   Collection Time: 06/26/20  1:41 PM  Result Value Ref Range   Iron 66 28 - 170 ug/dL   TIBC 307 250 - 450 ug/dL   Saturation Ratios 22 10.4 - 31.8 %   UIBC 241 ug/dL    Comment: Performed at [facility], [address] [place],  [address]    Radiology Abdomen 1 view (KUB)  Result Date: 06/17/2020 CLINICAL DATA:  Nephrolithiasis, bilateral flank pain, radiating to lower back. EXAM: ABDOMEN - 1 VIEW COMPARISON:  CT abdomen pelvis 09/04/2015, x-ray abdomen 02/22/2019. FINDINGS: The bowel gas pattern is normal. Redemonstration of a oval 1 cm amorphous calcific density overlying the superior pole of the right renal shadow and a curvilinear 0.9 cm amorphous calcific density overlying the expected region of the left renal shadow. Punctate calcific density overlies the inferior pole of the right renal shadow. Phleboliths noted overlying the pelvis. Right upper quadrant cholecystectomy clips. No acute displaced fracture of the visualized osseous structures. L1 and L5 kyphoplasty again noted. IMPRESSION: 1. Similar-appearing bilateral pericentimeter calcific densities as well as a punctate calcific density overlying the expected regions of the kidneys that could represent nephrolithiasis. 2. Nonobstructive bowel gas pattern. 3. Status post cholecystectomy. Electronically Signed   By: Iven Finn M.D.   On: 06/17/2020 16:34   MM 3D SCREEN BREAST BILATERAL  Result Date: 06/24/2020 CLINICAL DATA:  Screening. EXAM: DIGITAL SCREENING BILATERAL MAMMOGRAM WITH TOMO AND CAD COMPARISON:  Previous exam(s). ACR Breast Density Category d: The breast tissue is extremely dense, which lowers the sensitivity of mammography FINDINGS: There are no findings suspicious for malignancy. Images were processed with CAD. IMPRESSION: No mammographic  evidence of malignancy. A result letter of this screening mammogram will be mailed directly to the patient. RECOMMENDATION: Screening mammogram in one year. (Code:[id number]) BI-RADS CATEGORY  1: Negative. Electronically Signed   By: Audie Pinto M.D.   On: 06/24/2020  15:32    Assessment/Plan Leg pain, left Seems to worsen with swelling.  Venous insufficiency as described below.  Laser ablation of the left great saphenous vein is offered to the patient and she desires to proceed.  Type 2 diabetes mellitus with diabetic neuropathy (HCC) blood glucose control important in reducing the progression of atherosclerotic disease. Also, involved in wound healing. On appropriate medications.  Would be suspicious that a component of neuropathy is a major contributing factor as well.  HTN, goal below 140/80 blood pressure control important in reducing the progression of atherosclerotic disease. On appropriate oral medications.  Varicose veins of leg with pain, left Her noninvasive study today shows left great saphenous vein reflux including the saphenofemoral junction and the deep venous system near the saphenofemoral junction.  No DVT or superficial thrombophlebitis was identified.  Recommend  I have reviewed my previous  discussion with the patient regarding  varicose veins and why they cause symptoms. Patient will continue  wearing graduated compression stockings class 1 on a daily basis, beginning first thing in the morning and removing them in the evening.    In addition, behavioral modification including elevation during the day was again discussed and this will continue.  The patient has utilized over the counter pain medications and has been exercising.  However, at this time conservative therapy has not alleviated the patient's symptoms of leg pain and swelling  Recommend: laser ablation of the left great saphenous veins to eliminate the symptoms of pain and swelling of the lower  extremities caused by the severe superficial venous reflux disease.  Risks and benefits were discussed with the patient and she is agreeable to proceed.    Leotis Pain, MD  07/07/2020 11:13 AM    This note was created with Dragon medical transcription system.  Any errors from dictation are purely unintentional

## 2020-07-07 NOTE — Assessment & Plan Note (Signed)
Her noninvasive study today shows left great saphenous vein reflux including the saphenofemoral junction and the deep venous system near the saphenofemoral junction.  No DVT or superficial thrombophlebitis was identified.  Recommend  I have reviewed my previous  discussion with the patient regarding  varicose veins and why they cause symptoms. Patient will continue  wearing graduated compression stockings class 1 on a daily basis, beginning first thing in the morning and removing them in the evening.    In addition, behavioral modification including elevation during the day was again discussed and this will continue.  The patient has utilized over the counter pain medications and has been exercising.  However, at this time conservative therapy has not alleviated the patient's symptoms of leg pain and swelling  Recommend: laser ablation of the left great saphenous veins to eliminate the symptoms of pain and swelling of the lower extremities caused by the severe superficial venous reflux disease.  Risks and benefits were discussed with the patient and she is agreeable to proceed.

## 2020-07-07 NOTE — Patient Instructions (Signed)
Nonsurgical Procedures for Varicose Veins, Care After This sheet gives you information about how to care for yourself after your procedure. Your health care provider may also give you more specific instructions. If you have problems or questions, contact your health care provider. What can I expect after the procedure? After the procedure, it is common to have:  Swelling.  Bruising.  Soreness.  Mild skin discoloration.  Slight bleeding at the incision sites. Follow these instructions at home: Incision or puncture site care  Follow instructions from your health care provider about how to take care of your incision or puncture site. Make sure you: ? Wash your hands with soap and water before you change your bandage (dressing). If soap and water are not available, use hand sanitizer. ? Change your dressing as told by your health care provider. ? Leave skin glue or adhesive strips in place. These skin closures may need to stay in place for 2 weeks or longer. If adhesive strip edges start to loosen and curl up, you may trim the loose edges. Do not remove adhesive strips completely unless your health care provider tells you to do that.  Check your incision or puncture area every day for signs of infection. Check for: ? Redness, swelling, or pain. ? Fluid or blood. ? Warmth. ? Pus or a bad smell. General instructions   Take over-the-counter and prescription medicines only as told by your health care provider.  Wear compression stockings as told by your health care provider. These stockings help to prevent blood clots and reduce swelling in your legs.  Do not take baths, swim, or use a hot tub until your health care provider approves. Ask your health care provider if you can take showers.  Wear loose-fitting clothing.  Return to your normal activities as told by your health care provider. Ask your health care provider what activities are safe for you.  Get regular daily exercise. Walk  or ride a stationary bike daily or as told by your health care provider.  Keep all follow-up visits as told by your health care provider. This is important. Contact a health care provider if:  You have a fever.  You have redness, swelling, or pain around your incision or puncture site.  You have fluid or blood coming from your incision or puncture site.  Your incision or puncture site feels warm to the touch.  You have pus or a bad smell coming from your incision or puncture site.  You develop a cough. Get help right away if:  You pass out.  You have very bad pain in your leg.  You have leg pain that gets worse when you walk.  You have redness or swelling in your leg that is getting worse.  You have trouble breathing.  You cough up blood. Summary  After the procedure, it is common to have swelling, bruising, soreness, or mild skin discoloration.  Follow instructions from your health care provider about how to take care of your incision or puncture site.  Wear compression stockings as told by your health care provider. These stockings help to prevent blood clots and reduce swelling in your legs. This information is not intended to replace advice given to you by your health care provider. Make sure you discuss any questions you have with your health care provider. Document Revised: 07/14/2017 Document Reviewed: 11/11/2016 Elsevier Patient Education  [place].

## 2020-07-15 ENCOUNTER — Other Ambulatory Visit: Payer: Self-pay | Admitting: Infectious Diseases

## 2020-07-15 ENCOUNTER — Other Ambulatory Visit (HOSPITAL_COMMUNITY): Payer: Self-pay | Admitting: Infectious Diseases

## 2020-07-15 DIAGNOSIS — D849 Immunodeficiency, unspecified: Secondary | ICD-10-CM

## 2020-07-15 DIAGNOSIS — B450 Pulmonary cryptococcosis: Secondary | ICD-10-CM

## 2020-07-16 LAB — ACID FAST CULTURE WITH REFLEXED SENSITIVITIES (MYCOBACTERIA): Acid Fast Culture: NEGATIVE

## 2020-07-17 ENCOUNTER — Ambulatory Visit: Payer: Medicare Other | Admitting: Gastroenterology

## 2020-07-20 ENCOUNTER — Telehealth: Payer: Self-pay | Admitting: Infectious Diseases

## 2020-07-20 DIAGNOSIS — B450 Pulmonary cryptococcosis: Secondary | ICD-10-CM

## 2020-07-21 ENCOUNTER — Other Ambulatory Visit: Payer: Self-pay

## 2020-07-21 ENCOUNTER — Ambulatory Visit
Admission: RE | Admit: 2020-07-21 | Discharge: 2020-07-21 | Disposition: A | Discharge status: Home / Self Care | Payer: Medicare Other | Source: Ambulatory Visit | Attending: Infectious Diseases | Admitting: Infectious Diseases

## 2020-07-21 DIAGNOSIS — B450 Pulmonary cryptococcosis: Secondary | ICD-10-CM | POA: Insufficient documentation

## 2020-07-21 DIAGNOSIS — D849 Immunodeficiency, unspecified: Secondary | ICD-10-CM | POA: Diagnosis present

## 2020-07-22 ENCOUNTER — Inpatient Hospital Stay: Admission: RE | Admit: 2020-07-22 | Payer: Medicare Other | Source: Ambulatory Visit

## 2020-07-23 ENCOUNTER — Telehealth: Payer: Self-pay | Admitting: Infectious Diseases

## 2020-07-23 DIAGNOSIS — B450 Pulmonary cryptococcosis: Secondary | ICD-10-CM

## 2020-07-24 ENCOUNTER — Other Ambulatory Visit: Payer: Self-pay

## 2020-07-24 ENCOUNTER — Ambulatory Visit
Admission: RE | Admit: 2020-07-24 | Discharge: 2020-07-24 | Disposition: A | Discharge status: Home / Self Care | Payer: Medicare Other | Source: Ambulatory Visit | Attending: Infectious Diseases | Admitting: Infectious Diseases

## 2020-07-24 DIAGNOSIS — B450 Pulmonary cryptococcosis: Secondary | ICD-10-CM | POA: Insufficient documentation

## 2020-07-24 LAB — CRYPTOCOCCAL ANTIGEN, CSF: Crypto Ag: NEGATIVE

## 2020-07-24 LAB — APTT: aPTT: 25 seconds (ref 24–36)

## 2020-07-24 LAB — CSF CELL COUNT WITH DIFFERENTIAL
Eosinophils, CSF: 0 %
Lymphs, CSF: 0 %
Monocyte-Macrophage-Spinal Fluid: 0 %
RBC Count, CSF: 0 /mm3 (ref 0–3)
Segmented Neutrophils-CSF: 0 %
Tube #: 3
WBC, CSF: 0 /mm3 (ref 0–5)

## 2020-07-24 LAB — PROTIME-INR
INR: 1 (ref 0.8–1.2)
Prothrombin Time: 12.6 seconds (ref 11.4–15.2)

## 2020-07-24 LAB — CBC WITH DIFFERENTIAL/PLATELET
Abs Immature Granulocytes: 0.03 10*3/uL (ref 0.00–0.07)
Basophils Absolute: 0 10*3/uL (ref 0.0–0.1)
Basophils Relative: 1 %
Eosinophils Absolute: 0 10*3/uL (ref 0.0–0.5)
Eosinophils Relative: 1 %
HCT: 36.7 % (ref 36.0–46.0)
Hemoglobin: 12.4 g/dL (ref 12.0–15.0)
Immature Granulocytes: 0 %
Lymphocytes Relative: 12 %
Lymphs Abs: 0.9 10*3/uL (ref 0.7–4.0)
MCH: 32.7 pg (ref 26.0–34.0)
MCHC: 33.8 g/dL (ref 30.0–36.0)
MCV: 96.8 fL (ref 80.0–100.0)
Monocytes Absolute: 0.9 10*3/uL (ref 0.1–1.0)
Monocytes Relative: 13 %
Neutro Abs: 5.5 10*3/uL (ref 1.7–7.7)
Neutrophils Relative %: 73 %
Platelets: 249 10*3/uL (ref 150–400)
RBC: 3.79 MIL/uL — ABNORMAL LOW (ref 3.87–5.11)
RDW: 12.7 % (ref 11.5–15.5)
WBC: 7.5 10*3/uL (ref 4.0–10.5)
nRBC: 0 % (ref 0.0–0.2)

## 2020-07-24 LAB — GLUCOSE, CSF: Glucose, CSF: 65 mg/dL (ref 40–70)

## 2020-07-24 LAB — PROTEIN, CSF: Total  Protein, CSF: 42 mg/dL (ref 15–45)

## 2020-07-24 MED ORDER — LIDOCAINE HCL (PF) 1 % IJ SOLN
10.0000 mL | Freq: Once | INTRAMUSCULAR | Status: AC
  Administered 2020-07-24: 10 mL
  Filled 2020-07-24: qty 10

## 2020-07-27 ENCOUNTER — Other Ambulatory Visit: Payer: Medicare Other

## 2020-07-28 LAB — CSF CULTURE W GRAM STAIN
Culture: NO GROWTH
Gram Stain: NONE SEEN

## 2020-07-29 ENCOUNTER — Ambulatory Visit: Admit: 2020-07-29 | Payer: Medicare Other

## 2020-07-29 SURGERY — IRRIGATION, BRONCHUS
Anesthesia: General

## 2020-07-30 ENCOUNTER — Telehealth: Payer: Self-pay

## 2020-07-30 NOTE — Telephone Encounter (Signed)
LVM for pt to call office in regards to Medical Records Release Fax received from [place].  Please have patient to stop by the front office to sign release for Dr. Marius Ditch to obtain records from from [facility].  Thanks,  [place], [place]

## 2020-08-14 LAB — CULTURE, FUNGUS WITHOUT SMEAR

## 2020-08-15 DIAGNOSIS — B399 Histoplasmosis, unspecified: Secondary | ICD-10-CM

## 2020-08-15 HISTORY — DX: Histoplasmosis, unspecified: B39.9

## 2020-09-07 ENCOUNTER — Ambulatory Visit: Payer: Medicare Other | Admitting: Dermatology

## 2020-09-07 DIAGNOSIS — B09 Unspecified viral infection characterized by skin and mucous membrane lesions: Secondary | ICD-10-CM

## 2020-09-07 MED ORDER — BETAMETHASONE DIPROPIONATE 0.05 % EX LOTN: TOPICAL_LOTION | Freq: Two times a day (BID) | CUTANEOUS | 0 refills | Status: DC | PRN

## 2020-09-07 NOTE — Progress Notes (Signed)
   New Patient Visit  Subjective  Nicole Bass is a 70 y.o. female who presents for the following: Rash (Patient reports rash on bilateral arms, bilateral knees, face - resolved, left ankle. Patient noticed rash 10 days ago reports some redness. Seen by pcp and given TMC 0.1 cream but no relief. ). Patient denies use of new moisturizers or soaps and no new medications.   Objective  Well appearing patient in no apparent distress; mood and affect are within normal limits.  A focused examination was performed including face, bilateral arms, bilateral knees, left ankle . Relevant physical exam findings are noted in the Assessment and Plan.  Objective  bilateral arm, bilateral knees, left ankle, face: Fine pink rough eruptions on bilateral arms, bilateral knees, left ankle, face  Assessment & Plan  Viral exanthem - stocking / glove distribution Likely 2ndary to recent Covid infection bilateral arm, bilateral knees, left ankle, face  glove-and-stocking pattern  Start Betamethasone dipropionate 0.05 % lotion apply a thin layer to affected areas twice daily for 2 weeks  Start Cerave moisturizing lotion apply to affected areas daily.  Ordered Medications: betamethasone dipropionate 0.05 % lotion  Return for follow up as needed .  IRuthell Rummage, CMA, am acting as scribe for Sarina Ser, MD.  Documentation: I have reviewed the above documentation for accuracy and completeness, and I agree with the above.  Sarina Ser, MD

## 2020-09-07 NOTE — Patient Instructions (Addendum)
Topical steroids (such as triamcinolone, fluocinolone, fluocinonide, mometasone, clobetasol, halobetasol, betamethasone, hydrocortisone) can cause thinning and lightening of the skin if they are used for too long in the same area. Your physician has selected the right strength medicine for your problem and area affected on the body. Please use your medication only as directed by your physician to prevent side effects.   Apply Cerave lotion daily to affected areas

## 2020-09-08 ENCOUNTER — Encounter: Payer: Self-pay | Admitting: Dermatology

## 2020-09-10 ENCOUNTER — Other Ambulatory Visit: Payer: Self-pay | Admitting: Physical Medicine and Rehabilitation

## 2020-09-10 ENCOUNTER — Other Ambulatory Visit: Payer: Self-pay

## 2020-09-10 ENCOUNTER — Ambulatory Visit
Admission: RE | Admit: 2020-09-10 | Discharge: 2020-09-10 | Disposition: A | Discharge status: Home / Self Care | Payer: Medicare Other | Source: Ambulatory Visit | Attending: Physical Medicine and Rehabilitation | Admitting: Physical Medicine and Rehabilitation

## 2020-09-10 DIAGNOSIS — M5416 Radiculopathy, lumbar region: Secondary | ICD-10-CM

## 2020-09-16 NOTE — Telephone Encounter (Signed)
error 

## 2020-09-16 NOTE — Telephone Encounter (Signed)
ee

## 2020-09-17 ENCOUNTER — Ambulatory Visit: Payer: Medicare Other | Admitting: Gastroenterology

## 2020-09-22 ENCOUNTER — Other Ambulatory Visit: Payer: Self-pay | Admitting: Neurosurgery

## 2020-09-29 ENCOUNTER — Ambulatory Visit: Payer: Medicare Other | Admitting: Gastroenterology

## 2020-10-02 ENCOUNTER — Inpatient Hospital Stay: Admission: RE | Admit: 2020-10-02 | Payer: Medicare Other | Source: Ambulatory Visit

## 2020-10-02 ENCOUNTER — Other Ambulatory Visit: Payer: Self-pay

## 2020-10-02 ENCOUNTER — Encounter
Admission: RE | Admit: 2020-10-02 | Discharge: 2020-10-02 | Disposition: A | Discharge status: Home / Self Care | Payer: Medicare Other | Source: Ambulatory Visit | Attending: Neurosurgery | Admitting: Neurosurgery

## 2020-10-02 DIAGNOSIS — Z01812 Encounter for preprocedural laboratory examination: Secondary | ICD-10-CM | POA: Insufficient documentation

## 2020-10-02 LAB — URINALYSIS, ROUTINE W REFLEX MICROSCOPIC
Bacteria, UA: NONE SEEN
Bilirubin Urine: NEGATIVE
Glucose, UA: 50 mg/dL — AB
Hgb urine dipstick: NEGATIVE
Ketones, ur: NEGATIVE mg/dL
Nitrite: NEGATIVE
Protein, ur: 30 mg/dL — AB
Specific Gravity, Urine: 1.023 (ref 1.005–1.030)
pH: 5 (ref 5.0–8.0)

## 2020-10-02 LAB — PROTIME-INR
INR: 1 (ref 0.8–1.2)
Prothrombin Time: 13.1 seconds (ref 11.4–15.2)

## 2020-10-02 LAB — TYPE AND SCREEN
ABO/RH(D): A POS
Antibody Screen: NEGATIVE

## 2020-10-02 LAB — BASIC METABOLIC PANEL
Anion gap: 10 (ref 5–15)
BUN: 22 mg/dL (ref 8–23)
CO2: 27 mmol/L (ref 22–32)
Calcium: 8.5 mg/dL — ABNORMAL LOW (ref 8.9–10.3)
Chloride: 101 mmol/L (ref 98–111)
Creatinine, Ser: 1.54 mg/dL — ABNORMAL HIGH (ref 0.44–1.00)
GFR, Estimated: 37 mL/min — ABNORMAL LOW (ref >60)
Glucose, Bld: 166 mg/dL — ABNORMAL HIGH (ref 70–99)
Potassium: 3.9 mmol/L (ref 3.5–5.1)
Sodium: 138 mmol/L (ref 135–145)

## 2020-10-02 LAB — SURGICAL PCR SCREEN
MRSA, PCR: NEGATIVE
Staphylococcus aureus: NEGATIVE

## 2020-10-02 LAB — APTT: aPTT: 29 seconds (ref 24–36)

## 2020-10-02 NOTE — Patient Instructions (Addendum)
Your procedure is scheduled on: October 12, 2020 MONDAY Report to the Registration Desk on the 1st floor of the [facility]. To find out your arrival time, please call [phone] between 1PM - 3PM on: Friday October 09, 2020  REMEMBER: Instructions that are not followed completely may result in serious medical risk, up to and including death; or upon the discretion of your surgeon and anesthesiologist your surgery may need to be rescheduled.  Do not eat food after midnight the night before surgery.  No gum chewing, lozengers or hard candies.  You may however, drink CLEAR liquids up to 2 hours before you are scheduled to arrive for your surgery. Do not drink anything within 2 hours of your scheduled arrival time.  Clear liquids include: - water  - apple juice without pulp - gatorade (not RED, PURPLE, OR BLUE) - black coffee or tea (Do NOT add milk or creamers to the coffee or tea) Do NOT drink anything that is not on this list.  Type 1 and Type 2 diabetics should only drink water.  TAKE THESE MEDICATIONS THE MORNING OF SURGERY WITH A SIP OF WATER: BACLOFEN CETIRIZINE FLUCONAZOLE GABPENTIN PREDNISONE OMEPRAZOLE TAKE A DOSE NIGHT BEFORE AND ONE THE MORNING OF SURGERY  DO NOT TAKE LASIX OR TELMISARTAN  STOP ASPIRIN PER INSTRUCTION- LAST DOSE 10/04/2020   TAKE B/P OVER THE WEEKEND AND CALL MD IF B/P CONTINUE TO RUN LOW  One week prior to surgery: Stop Anti-inflammatories (NSAIDS) such as Advil, Aleve, Ibuprofen, Motrin, Naproxen, Naprosyn and Aspirin based products such as Excedrin, Goodys Powder, BC Powder. Stop ANY OVER THE COUNTER supplements until after surgery. (However, you may continue taking Vitamin D, Vitamin B, and  POTASSIUM AND multivitamin up until the day before surgery.)  No Alcohol for 24 hours before or after surgery.  No Smoking including e-cigarettes for 24 hours prior to surgery.  No chewable tobacco products for at least 6 hours prior to surgery.  No  nicotine patches on the day of surgery.  Do not use any "recreational" drugs for at least a week prior to your surgery.  Please be advised that the combination of cocaine and anesthesia may have negative outcomes, up to and including death. If you test positive for cocaine, your surgery will be cancelled.  On the morning of surgery brush your teeth with toothpaste and water, you may rinse your mouth with mouthwash if you wish. Do not swallow any toothpaste or mouthwash.  Do not wear jewelry, make-up, hairpins, clips or nail polish.  Do not wear lotions, powders, or perfumes OR DEODORANT    Do not shave body from the neck down 48 hours prior to surgery just in case you cut yourself which could leave a site for infection.  Also, freshly shaved skin may become irritated if using the CHG soap.  Contact lenses, hearing aids and dentures may not be worn into surgery.  Do not bring valuables to the hospital. [facility] is not responsible for any missing/lost belongings or valuables.   Use CHG Soap  as directed on instruction sheet.  Notify your doctor if there is any change in your medical condition (cold, fever, infection).  Wear comfortable clothing (specific to your surgery type) to the hospital.  Plan for stool softeners for home use; pain medications have a tendency to cause constipation. You can also help prevent constipation by eating foods high in fiber such as fruits and vegetables and drinking plenty of fluids as your diet allows.  After  surgery, you can help prevent lung complications by doing breathing exercises.  Take deep breaths and cough every 1-2 hours. Your doctor may order a device called an Incentive Spirometer to help you take deep breaths. When coughing or sneezing, hold a pillow firmly against your incision with both hands. This is called "splinting." Doing this helps protect your incision. It also decreases belly discomfort.  If you are being admitted to the  hospital overnight, [facility] [place]   If you are being discharged the day of surgery, you will not be allowed to drive home. You will need a responsible adult (18 years or older) to drive you home and stay with you that night.   Please call the [facility] at [phone] if you have any questions about these instructions.  Visitation Policy:  Patients undergoing a surgery or procedure may have one family member or support person with them as long as that person is not COVID-19 positive or experiencing its symptoms.  That person may remain in the waiting area during the procedure.  Inpatient Visitation:    Visiting hours are 7 a.m. to 8 p.m. Patients will be allowed one visitor. The visitor may change daily. The visitor must pass COVID-19 screenings, use hand sanitizer when entering and exiting the patient's room and wear a mask at all times, including in the patient's room. Patients must also wear a mask when staff or their visitor are in the room. Masking is required regardless of vaccination status. Systemwide, no visitors 17 or younger.

## 2020-10-08 ENCOUNTER — Other Ambulatory Visit: Admission: RE | Admit: 2020-10-08 | Payer: Medicare Other | Source: Ambulatory Visit

## 2020-10-12 ENCOUNTER — Inpatient Hospital Stay: Payer: Medicare Other | Admitting: Anesthesiology

## 2020-10-12 ENCOUNTER — Other Ambulatory Visit: Payer: Self-pay

## 2020-10-12 ENCOUNTER — Inpatient Hospital Stay: Payer: Medicare Other | Admitting: Urgent Care

## 2020-10-12 ENCOUNTER — Encounter
Admission: RE | Disposition: A | Discharge status: Home / Self Care | Payer: Self-pay | Source: Home / Self Care | Attending: Neurosurgery

## 2020-10-12 ENCOUNTER — Inpatient Hospital Stay: Payer: Medicare Other

## 2020-10-12 ENCOUNTER — Encounter: Payer: Self-pay | Admitting: Neurosurgery

## 2020-10-12 ENCOUNTER — Inpatient Hospital Stay
Admission: RE | Admit: 2020-10-12 | Discharge: 2020-10-15 | DRG: 454 | Disposition: A | Discharge status: Home / Self Care | Payer: Medicare Other | Attending: Neurosurgery | Admitting: Neurosurgery

## 2020-10-12 DIAGNOSIS — Z419 Encounter for procedure for purposes other than remedying health state, unspecified: Secondary | ICD-10-CM

## 2020-10-12 DIAGNOSIS — E1140 Type 2 diabetes mellitus with diabetic neuropathy, unspecified: Secondary | ICD-10-CM | POA: Diagnosis present

## 2020-10-12 DIAGNOSIS — I10 Essential (primary) hypertension: Secondary | ICD-10-CM | POA: Diagnosis present

## 2020-10-12 DIAGNOSIS — Z833 Family history of diabetes mellitus: Secondary | ICD-10-CM

## 2020-10-12 DIAGNOSIS — M810 Age-related osteoporosis without current pathological fracture: Secondary | ICD-10-CM | POA: Diagnosis present

## 2020-10-12 DIAGNOSIS — F419 Anxiety disorder, unspecified: Secondary | ICD-10-CM | POA: Diagnosis present

## 2020-10-12 DIAGNOSIS — H9192 Unspecified hearing loss, left ear: Secondary | ICD-10-CM | POA: Diagnosis present

## 2020-10-12 DIAGNOSIS — Z7983 Long term (current) use of bisphosphonates: Secondary | ICD-10-CM

## 2020-10-12 DIAGNOSIS — Z9049 Acquired absence of other specified parts of digestive tract: Secondary | ICD-10-CM | POA: Diagnosis not present

## 2020-10-12 DIAGNOSIS — Z87442 Personal history of urinary calculi: Secondary | ICD-10-CM | POA: Diagnosis not present

## 2020-10-12 DIAGNOSIS — Z96651 Presence of right artificial knee joint: Secondary | ICD-10-CM | POA: Diagnosis present

## 2020-10-12 DIAGNOSIS — Z79899 Other long term (current) drug therapy: Secondary | ICD-10-CM

## 2020-10-12 DIAGNOSIS — Z96611 Presence of right artificial shoulder joint: Secondary | ICD-10-CM | POA: Diagnosis present

## 2020-10-12 DIAGNOSIS — F40240 Claustrophobia: Secondary | ICD-10-CM | POA: Diagnosis present

## 2020-10-12 DIAGNOSIS — Z7952 Long term (current) use of systemic steroids: Secondary | ICD-10-CM

## 2020-10-12 DIAGNOSIS — Z885 Allergy status to narcotic agent status: Secondary | ICD-10-CM

## 2020-10-12 DIAGNOSIS — K5090 Crohn's disease, unspecified, without complications: Secondary | ICD-10-CM | POA: Diagnosis present

## 2020-10-12 DIAGNOSIS — G629 Polyneuropathy, unspecified: Secondary | ICD-10-CM | POA: Diagnosis present

## 2020-10-12 DIAGNOSIS — M48062 Spinal stenosis, lumbar region with neurogenic claudication: Secondary | ICD-10-CM

## 2020-10-12 DIAGNOSIS — Z888 Allergy status to other drugs, medicaments and biological substances status: Secondary | ICD-10-CM

## 2020-10-12 DIAGNOSIS — K219 Gastro-esophageal reflux disease without esophagitis: Secondary | ICD-10-CM | POA: Diagnosis present

## 2020-10-12 DIAGNOSIS — Z7982 Long term (current) use of aspirin: Secondary | ICD-10-CM

## 2020-10-12 DIAGNOSIS — Z881 Allergy status to other antibiotic agents status: Secondary | ICD-10-CM

## 2020-10-12 DIAGNOSIS — M48061 Spinal stenosis, lumbar region without neurogenic claudication: Principal | ICD-10-CM | POA: Diagnosis present

## 2020-10-12 DIAGNOSIS — M5030 Other cervical disc degeneration, unspecified cervical region: Secondary | ICD-10-CM | POA: Diagnosis present

## 2020-10-12 DIAGNOSIS — D509 Iron deficiency anemia, unspecified: Secondary | ICD-10-CM | POA: Diagnosis present

## 2020-10-12 DIAGNOSIS — Z90710 Acquired absence of both cervix and uterus: Secondary | ICD-10-CM

## 2020-10-12 DIAGNOSIS — M069 Rheumatoid arthritis, unspecified: Secondary | ICD-10-CM | POA: Diagnosis present

## 2020-10-12 DIAGNOSIS — Z882 Allergy status to sulfonamides status: Secondary | ICD-10-CM

## 2020-10-12 DIAGNOSIS — H8109 Meniere's disease, unspecified ear: Secondary | ICD-10-CM | POA: Diagnosis present

## 2020-10-12 DIAGNOSIS — M4317 Spondylolisthesis, lumbosacral region: Secondary | ICD-10-CM | POA: Diagnosis present

## 2020-10-12 DIAGNOSIS — G2581 Restless legs syndrome: Secondary | ICD-10-CM | POA: Diagnosis present

## 2020-10-12 DIAGNOSIS — Z981 Arthrodesis status: Secondary | ICD-10-CM

## 2020-10-12 DIAGNOSIS — M5136 Other intervertebral disc degeneration, lumbar region: Secondary | ICD-10-CM | POA: Diagnosis present

## 2020-10-12 DIAGNOSIS — M7138 Other bursal cyst, other site: Secondary | ICD-10-CM | POA: Diagnosis present

## 2020-10-12 DIAGNOSIS — M79606 Pain in leg, unspecified: Secondary | ICD-10-CM | POA: Diagnosis present

## 2020-10-12 HISTORY — PX: LUMBAR LAMINECTOMY FOR EPIDURAL ABSCESS: SHX5956

## 2020-10-12 HISTORY — PX: MAXIMUM ACCESS (MAS) TRANSFORAMINAL LUMBAR INTERBODY FUSION (TLIF) 1 LEVEL: SHX6392

## 2020-10-12 LAB — CBC
HCT: 26 % — ABNORMAL LOW (ref 36.0–46.0)
Hemoglobin: 8.4 g/dL — ABNORMAL LOW (ref 12.0–15.0)
MCH: 31.9 pg (ref 26.0–34.0)
MCHC: 32.3 g/dL (ref 30.0–36.0)
MCV: 98.9 fL (ref 80.0–100.0)
Platelets: 336 10*3/uL (ref 150–400)
RBC: 2.63 MIL/uL — ABNORMAL LOW (ref 3.87–5.11)
RDW: 13.7 % (ref 11.5–15.5)
WBC: 11.7 10*3/uL — ABNORMAL HIGH (ref 4.0–10.5)
nRBC: 0 % (ref 0.0–0.2)

## 2020-10-12 SURGERY — MAXIMUM ACCESS (MAS) TRANSFORAMINAL LUMBAR INTERBODY FUSION (TLIF) 1 LEVEL
Anesthesia: General

## 2020-10-12 MED ORDER — CHOLECALCIFEROL 10 MCG (400 UNIT) PO TABS
1000.0000 [IU] | ORAL_TABLET | Freq: Every day | ORAL | Status: DC
  Administered 2020-10-13 – 2020-10-15 (×3): 1000 [IU] via ORAL
  Filled 2020-10-12 (×3): qty 3

## 2020-10-12 MED ORDER — DEXAMETHASONE SODIUM PHOSPHATE 10 MG/ML IJ SOLN
INTRAMUSCULAR | Status: DC | PRN
  Administered 2020-10-12: 10 mg via INTRAVENOUS

## 2020-10-12 MED ORDER — METHYLPREDNISOLONE ACETATE 40 MG/ML IJ SUSP
INTRAMUSCULAR | Status: AC
  Filled 2020-10-12: qty 1

## 2020-10-12 MED ORDER — PROMETHAZINE HCL 25 MG/ML IJ SOLN
12.5000 mg | Freq: Once | INTRAMUSCULAR | Status: DC | PRN
Start: 2020-10-12 — End: 2020-10-12

## 2020-10-12 MED ORDER — SODIUM CHLORIDE 0.9% FLUSH: 3.0000 mL | INTRAVENOUS | Status: DC | PRN

## 2020-10-12 MED ORDER — ACETAMINOPHEN 500 MG PO TABS: 1000.0000 mg | ORAL_TABLET | Freq: Once | ORAL | Status: AC

## 2020-10-12 MED ORDER — POTASSIUM CHLORIDE CRYS ER 20 MEQ PO TBCR
EXTENDED_RELEASE_TABLET | ORAL | Status: AC
  Administered 2020-10-12: 20 meq via ORAL
  Filled 2020-10-12: qty 1

## 2020-10-12 MED ORDER — MORPHINE SULFATE (PF) 4 MG/ML IV SOLN
INTRAVENOUS | Status: AC
  Administered 2020-10-12: 2 mg via INTRAVENOUS
  Filled 2020-10-12: qty 1

## 2020-10-12 MED ORDER — FENTANYL CITRATE (PF) 100 MCG/2ML IJ SOLN
INTRAMUSCULAR | Status: AC
  Administered 2020-10-12: 50 ug via INTRAVENOUS
  Filled 2020-10-12: qty 2

## 2020-10-12 MED ORDER — GLYCOPYRROLATE 0.2 MG/ML IJ SOLN
INTRAMUSCULAR | Status: DC | PRN
  Administered 2020-10-12: .2 mg via INTRAVENOUS

## 2020-10-12 MED ORDER — LIDOCAINE HCL (CARDIAC) PF 100 MG/5ML IV SOSY
PREFILLED_SYRINGE | INTRAVENOUS | Status: DC | PRN
  Administered 2020-10-12: 100 mg via INTRAVENOUS

## 2020-10-12 MED ORDER — LORAZEPAM 2 MG/ML IJ SOLN: 1.0000 mg | Freq: Once | INTRAMUSCULAR | Status: AC | PRN

## 2020-10-12 MED ORDER — ACETAMINOPHEN 500 MG PO TABS
1000.0000 mg | ORAL_TABLET | Freq: Once | ORAL | Status: AC
  Administered 2020-10-12: 1000 mg via ORAL

## 2020-10-12 MED ORDER — PHENOL 1.4 % MT LIQD
1.0000 | OROMUCOSAL | Status: DC | PRN
  Filled 2020-10-12: qty 177

## 2020-10-12 MED ORDER — FENTANYL CITRATE (PF) 100 MCG/2ML IJ SOLN
INTRAMUSCULAR | Status: AC
  Filled 2020-10-12: qty 2

## 2020-10-12 MED ORDER — MORPHINE SULFATE (PF) 4 MG/ML IV SOLN: 2.0000 mg | Freq: Once | INTRAVENOUS | Status: AC

## 2020-10-12 MED ORDER — LORAZEPAM 2 MG/ML IJ SOLN
INTRAMUSCULAR | Status: AC
  Administered 2020-10-12: 1 mg via INTRAVENOUS
  Filled 2020-10-12: qty 1

## 2020-10-12 MED ORDER — MENTHOL 3 MG MT LOZG
1.0000 | LOZENGE | OROMUCOSAL | Status: DC | PRN
  Filled 2020-10-12: qty 9

## 2020-10-12 MED ORDER — REMIFENTANIL HCL 1 MG IV SOLR
INTRAVENOUS | Status: AC
  Filled 2020-10-12: qty 1000

## 2020-10-12 MED ORDER — PHENYLEPHRINE HCL (PRESSORS) 10 MG/ML IV SOLN
INTRAVENOUS | Status: DC | PRN
  Administered 2020-10-12 (×2): 200 ug via INTRAVENOUS

## 2020-10-12 MED ORDER — ACETAMINOPHEN 500 MG PO TABS
ORAL_TABLET | ORAL | Status: AC
  Administered 2020-10-12: 1000 mg via ORAL
  Filled 2020-10-12: qty 2

## 2020-10-12 MED ORDER — ENOXAPARIN SODIUM 40 MG/0.4ML ~~LOC~~ SOLN
40.0000 mg | SUBCUTANEOUS | Status: DC
  Administered 2020-10-13: 40 mg via SUBCUTANEOUS

## 2020-10-12 MED ORDER — MIDAZOLAM HCL 2 MG/2ML IJ SOLN
INTRAMUSCULAR | Status: AC
  Filled 2020-10-12: qty 2

## 2020-10-12 MED ORDER — SUCCINYLCHOLINE CHLORIDE 20 MG/ML IJ SOLN
INTRAMUSCULAR | Status: DC | PRN
  Administered 2020-10-12: 100 mg via INTRAVENOUS

## 2020-10-12 MED ORDER — LORATADINE 10 MG PO TABS
10.0000 mg | ORAL_TABLET | Freq: Every day | ORAL | Status: DC
  Administered 2020-10-13 – 2020-10-15 (×3): 10 mg via ORAL
  Filled 2020-10-12 (×3): qty 1

## 2020-10-12 MED ORDER — ONDANSETRON HCL 4 MG/2ML IJ SOLN: 4.0000 mg | Freq: Four times a day (QID) | INTRAMUSCULAR | Status: DC | PRN

## 2020-10-12 MED ORDER — BUPIVACAINE-EPINEPHRINE (PF) 0.5% -1:200000 IJ SOLN
INTRAMUSCULAR | Status: DC | PRN
  Administered 2020-10-12: 10 mL

## 2020-10-12 MED ORDER — POTASSIUM CHLORIDE CRYS ER 20 MEQ PO TBCR: 20.0000 meq | EXTENDED_RELEASE_TABLET | ORAL | Status: DC

## 2020-10-12 MED ORDER — ADULT MULTIVITAMIN W/MINERALS CH
1.0000 | ORAL_TABLET | Freq: Every day | ORAL | Status: DC
  Administered 2020-10-13 – 2020-10-15 (×3): 1 via ORAL
  Filled 2020-10-12 (×3): qty 1

## 2020-10-12 MED ORDER — FLUCONAZOLE 100 MG PO TABS
200.0000 mg | ORAL_TABLET | Freq: Every day | ORAL | Status: DC
  Administered 2020-10-13 – 2020-10-15 (×3): 200 mg via ORAL
  Filled 2020-10-12 (×3): qty 2

## 2020-10-12 MED ORDER — VANCOMYCIN HCL 1000 MG IV SOLR
INTRAVENOUS | Status: DC | PRN
  Administered 2020-10-12: 1000 mg via TOPICAL

## 2020-10-12 MED ORDER — PANTOPRAZOLE SODIUM 40 MG PO TBEC
40.0000 mg | DELAYED_RELEASE_TABLET | Freq: Every day | ORAL | Status: DC
  Administered 2020-10-13 – 2020-10-15 (×3): 40 mg via ORAL
  Filled 2020-10-12 (×3): qty 1

## 2020-10-12 MED ORDER — GELATIN ABSORBABLE 12-7 MM EX MISC
CUTANEOUS | Status: AC
  Filled 2020-10-12: qty 1

## 2020-10-12 MED ORDER — FENTANYL CITRATE (PF) 100 MCG/2ML IJ SOLN
INTRAMUSCULAR | Status: DC | PRN
  Administered 2020-10-12 (×4): 50 ug via INTRAVENOUS

## 2020-10-12 MED ORDER — SENNA 8.6 MG PO TABS
1.0000 | ORAL_TABLET | Freq: Two times a day (BID) | ORAL | Status: DC
  Administered 2020-10-12 – 2020-10-15 (×6): 8.6 mg via ORAL
  Filled 2020-10-12 (×8): qty 1

## 2020-10-12 MED ORDER — DIPHENOXYLATE-ATROPINE 2.5-0.025 MG PO TABS: 1.0000 | ORAL_TABLET | Freq: Every day | ORAL | Status: DC | PRN

## 2020-10-12 MED ORDER — SODIUM CHLORIDE (PF) 0.9 % IJ SOLN
INTRAMUSCULAR | Status: AC
  Filled 2020-10-12: qty 50

## 2020-10-12 MED ORDER — PROPOFOL 10 MG/ML IV BOLUS
INTRAVENOUS | Status: DC | PRN
  Administered 2020-10-12: 120 mg via INTRAVENOUS

## 2020-10-12 MED ORDER — SODIUM CHLORIDE 0.9 % IV SOLN
INTRAVENOUS | Status: DC | PRN
  Administered 2020-10-12: 25 ug/min via INTRAVENOUS

## 2020-10-12 MED ORDER — CHLORHEXIDINE GLUCONATE 0.12 % MT SOLN
OROMUCOSAL | Status: AC
  Administered 2020-10-12: 15 mL via OROMUCOSAL
  Filled 2020-10-12: qty 15

## 2020-10-12 MED ORDER — OXYCODONE HCL 5 MG PO TABS: 5.0000 mg | ORAL_TABLET | Freq: Once | ORAL | Status: AC | PRN

## 2020-10-12 MED ORDER — LIDOCAINE VISCOUS HCL 2 % MT SOLN
15.0000 mL | OROMUCOSAL | Status: DC | PRN
  Filled 2020-10-12: qty 15

## 2020-10-12 MED ORDER — ACETAMINOPHEN 650 MG RE SUPP
650.0000 mg | RECTAL | Status: DC | PRN
  Filled 2020-10-12: qty 1

## 2020-10-12 MED ORDER — ONDANSETRON HCL 4 MG/2ML IJ SOLN
INTRAMUSCULAR | Status: DC | PRN
  Administered 2020-10-12 (×2): 4 mg via INTRAVENOUS

## 2020-10-12 MED ORDER — THROMBIN (RECOMBINANT) 5000 UNITS EX SOLR
CUTANEOUS | Status: AC
  Filled 2020-10-12: qty 5000

## 2020-10-12 MED ORDER — SUCRALFATE 1 G PO TABS
0.5000 g | ORAL_TABLET | Freq: Three times a day (TID) | ORAL | Status: DC | PRN
  Filled 2020-10-12 (×3): qty 0.5

## 2020-10-12 MED ORDER — LACTATED RINGERS IV SOLN: INTRAVENOUS | Status: DC

## 2020-10-12 MED ORDER — MORPHINE SULFATE (PF) 4 MG/ML IV SOLN: 2.0000 mg | INTRAVENOUS | Status: DC | PRN

## 2020-10-12 MED ORDER — EPHEDRINE SULFATE 50 MG/ML IJ SOLN
INTRAMUSCULAR | Status: DC | PRN
  Administered 2020-10-12: 10 mg via INTRAVENOUS
  Administered 2020-10-12: 5 mg via INTRAVENOUS

## 2020-10-12 MED ORDER — DEXMEDETOMIDINE (PRECEDEX) IN NS 20 MCG/5ML (4 MCG/ML) IV SYRINGE
PREFILLED_SYRINGE | INTRAVENOUS | Status: AC
  Filled 2020-10-12: qty 10

## 2020-10-12 MED ORDER — REMIFENTANIL HCL 1 MG IV SOLR
INTRAVENOUS | Status: DC | PRN
Start: 2020-10-12 — End: 2020-10-12
  Administered 2020-10-12: .1 ug/kg/min via INTRAVENOUS

## 2020-10-12 MED ORDER — BUPIVACAINE HCL (PF) 0.5 % IJ SOLN
INTRAMUSCULAR | Status: AC
  Filled 2020-10-12: qty 30

## 2020-10-12 MED ORDER — DEXAMETHASONE SODIUM PHOSPHATE 10 MG/ML IJ SOLN
INTRAMUSCULAR | Status: AC
  Filled 2020-10-12: qty 3

## 2020-10-12 MED ORDER — ORAL CARE MOUTH RINSE: 15.0000 mL | Freq: Once | OROMUCOSAL | Status: AC

## 2020-10-12 MED ORDER — PREDNISONE 1 MG PO TABS
3.0000 mg | ORAL_TABLET | Freq: Every day | ORAL | Status: DC
  Administered 2020-10-13 – 2020-10-15 (×3): 3 mg via ORAL
  Filled 2020-10-12 (×3): qty 3

## 2020-10-12 MED ORDER — BUPIVACAINE LIPOSOME 1.3 % IJ SUSP
INTRAMUSCULAR | Status: AC
  Filled 2020-10-12: qty 20

## 2020-10-12 MED ORDER — ACETAMINOPHEN ER 650 MG PO TBCR: 1300.0000 mg | EXTENDED_RELEASE_TABLET | Freq: Three times a day (TID) | ORAL | Status: DC | PRN

## 2020-10-12 MED ORDER — ROCURONIUM BROMIDE 10 MG/ML (PF) SYRINGE
PREFILLED_SYRINGE | INTRAVENOUS | Status: AC
  Filled 2020-10-12: qty 20

## 2020-10-12 MED ORDER — CEFAZOLIN SODIUM-DEXTROSE 2-4 GM/100ML-% IV SOLN
2.0000 g | INTRAVENOUS | Status: AC
  Administered 2020-10-12: 2 g via INTRAVENOUS

## 2020-10-12 MED ORDER — FUROSEMIDE 20 MG PO TABS
20.0000 mg | ORAL_TABLET | Freq: Every day | ORAL | Status: DC
  Administered 2020-10-13 – 2020-10-15 (×3): 20 mg via ORAL
  Filled 2020-10-12 (×3): qty 1

## 2020-10-12 MED ORDER — ROPINIROLE HCL 1 MG PO TABS
1.0000 mg | ORAL_TABLET | Freq: Every evening | ORAL | Status: DC | PRN
Start: 2020-10-12 — End: 2020-10-15
  Filled 2020-10-12: qty 1

## 2020-10-12 MED ORDER — ACETAMINOPHEN 500 MG PO TABS
ORAL_TABLET | ORAL | Status: AC
  Administered 2020-10-13: 650 mg via ORAL
  Filled 2020-10-12: qty 2

## 2020-10-12 MED ORDER — MORPHINE SULFATE (PF) 4 MG/ML IV SOLN
INTRAVENOUS | Status: AC
  Filled 2020-10-12: qty 1

## 2020-10-12 MED ORDER — PROBIOTIC PO TBEC: DELAYED_RELEASE_TABLET | Freq: Every day | ORAL | Status: DC

## 2020-10-12 MED ORDER — VASOPRESSIN 20 UNIT/ML IV SOLN
INTRAVENOUS | Status: AC
  Filled 2020-10-12: qty 2

## 2020-10-12 MED ORDER — VANCOMYCIN HCL 1000 MG IV SOLR
INTRAVENOUS | Status: AC
  Filled 2020-10-12: qty 1000

## 2020-10-12 MED ORDER — OXYCODONE HCL 5 MG PO TABS
ORAL_TABLET | ORAL | Status: AC
  Administered 2020-10-12: 5 mg via ORAL
  Filled 2020-10-12: qty 1

## 2020-10-12 MED ORDER — GABAPENTIN 100 MG PO CAPS: 100.0000 mg | ORAL_CAPSULE | ORAL | Status: DC

## 2020-10-12 MED ORDER — ONDANSETRON HCL 4 MG/2ML IJ SOLN
INTRAMUSCULAR | Status: AC
  Filled 2020-10-12: qty 8

## 2020-10-12 MED ORDER — GABAPENTIN 400 MG PO CAPS
ORAL_CAPSULE | ORAL | Status: AC
  Administered 2020-10-12: 400 mg via ORAL
  Filled 2020-10-12: qty 1

## 2020-10-12 MED ORDER — FENTANYL CITRATE (PF) 100 MCG/2ML IJ SOLN
25.0000 ug | INTRAMUSCULAR | Status: DC | PRN
Start: 2020-10-12 — End: 2020-10-12
  Administered 2020-10-12 (×2): 50 ug via INTRAVENOUS

## 2020-10-12 MED ORDER — OXYCODONE HCL 5 MG/5ML PO SOLN: 5.0000 mg | Freq: Once | ORAL | Status: AC | PRN

## 2020-10-12 MED ORDER — SUCCINYLCHOLINE CHLORIDE 200 MG/10ML IV SOSY
PREFILLED_SYRINGE | INTRAVENOUS | Status: AC
  Filled 2020-10-12: qty 20

## 2020-10-12 MED ORDER — BACLOFEN 10 MG PO TABS
5.0000 mg | ORAL_TABLET | Freq: Four times a day (QID) | ORAL | Status: DC
  Administered 2020-10-12 – 2020-10-15 (×11): 5 mg via ORAL
  Filled 2020-10-12 (×16): qty 0.5

## 2020-10-12 MED ORDER — DEXMEDETOMIDINE (PRECEDEX) IN NS 20 MCG/5ML (4 MCG/ML) IV SYRINGE
PREFILLED_SYRINGE | INTRAVENOUS | Status: DC | PRN
  Administered 2020-10-12 (×3): 4 ug via INTRAVENOUS

## 2020-10-12 MED ORDER — MORPHINE SULFATE (PF) 2 MG/ML IV SOLN
2.0000 mg | INTRAVENOUS | Status: DC | PRN
  Administered 2020-10-12 – 2020-10-13 (×2): 2 mg via INTRAVENOUS

## 2020-10-12 MED ORDER — TRAMADOL HCL 50 MG PO TABS
50.0000 mg | ORAL_TABLET | Freq: Four times a day (QID) | ORAL | Status: DC | PRN
Start: 2020-10-12 — End: 2020-10-14

## 2020-10-12 MED ORDER — CEFAZOLIN SODIUM-DEXTROSE 2-4 GM/100ML-% IV SOLN
INTRAVENOUS | Status: AC
  Filled 2020-10-12: qty 100

## 2020-10-12 MED ORDER — GLYCOPYRROLATE 0.2 MG/ML IJ SOLN
INTRAMUSCULAR | Status: AC
  Filled 2020-10-12: qty 3

## 2020-10-12 MED ORDER — ACETAMINOPHEN 325 MG PO TABS: 650.0000 mg | ORAL_TABLET | ORAL | Status: DC | PRN

## 2020-10-12 MED ORDER — ONDANSETRON HCL 4 MG PO TABS: 4.0000 mg | ORAL_TABLET | Freq: Four times a day (QID) | ORAL | Status: DC | PRN

## 2020-10-12 MED ORDER — SODIUM CHLORIDE 0.9% FLUSH
3.0000 mL | Freq: Two times a day (BID) | INTRAVENOUS | Status: DC
  Administered 2020-10-13 – 2020-10-14 (×4): 3 mL via INTRAVENOUS

## 2020-10-12 MED ORDER — MIDAZOLAM HCL 2 MG/2ML IJ SOLN
INTRAMUSCULAR | Status: DC | PRN
  Administered 2020-10-12 (×2): 1 mg via INTRAVENOUS

## 2020-10-12 MED ORDER — RISAQUAD PO CAPS
1.0000 | ORAL_CAPSULE | Freq: Every day | ORAL | Status: DC
  Administered 2020-10-13 – 2020-10-14 (×2): 1 via ORAL
  Filled 2020-10-12 (×3): qty 1

## 2020-10-12 MED ORDER — IRBESARTAN 75 MG PO TABS
75.0000 mg | ORAL_TABLET | Freq: Every day | ORAL | Status: DC
  Administered 2020-10-13 – 2020-10-15 (×3): 75 mg via ORAL
  Filled 2020-10-12 (×3): qty 1

## 2020-10-12 MED ORDER — SODIUM CHLORIDE 0.9 % IV SOLN: 250.0000 mL | INTRAVENOUS | Status: DC

## 2020-10-12 MED ORDER — BETAMETHASONE DIPROPIONATE 0.05 % EX LOTN: TOPICAL_LOTION | Freq: Two times a day (BID) | CUTANEOUS | Status: DC | PRN

## 2020-10-12 MED ORDER — ROCURONIUM BROMIDE 100 MG/10ML IV SOLN
INTRAVENOUS | Status: DC | PRN
  Administered 2020-10-12: 10 mg via INTRAVENOUS

## 2020-10-12 MED ORDER — LACTATED RINGERS IV SOLN: INTRAVENOUS | Status: DC | PRN

## 2020-10-12 MED ORDER — GABAPENTIN 400 MG PO CAPS: 400.0000 mg | ORAL_CAPSULE | Freq: Every day | ORAL | Status: DC

## 2020-10-12 MED ORDER — THROMBIN 5000 UNITS EX SOLR
CUTANEOUS | Status: DC | PRN
  Administered 2020-10-12: 5000 [IU] via TOPICAL

## 2020-10-12 MED ORDER — CHLORHEXIDINE GLUCONATE 0.12 % MT SOLN: 15.0000 mL | Freq: Once | OROMUCOSAL | Status: AC

## 2020-10-12 SURGICAL SUPPLY — 86 items
ADH SKN CLS APL DERMABOND .7 (GAUZE/BANDAGES/DRESSINGS) ×2
AGENT HMST MTR 8 SURGIFLO (HEMOSTASIS) ×2
APL PRP STRL LF DISP 70% ISPRP (MISCELLANEOUS) ×2
APL SRG 60D 8 XTD TIP BNDBL (TIP)
BLADE BOVIE TIP EXT 4 (BLADE) ×3 IMPLANT
BULB RESERV EVAC DRAIN JP 100C (MISCELLANEOUS) ×3 IMPLANT
BUR NEURO DRILL SOFT 3.0X3.8M (BURR) ×3 IMPLANT
CANISTER SUCT 1200ML W/VALVE (MISCELLANEOUS) IMPLANT
CHLORAPREP W/TINT 26 (MISCELLANEOUS) ×3 IMPLANT
CLIP NEUROVISION LG (CLIP) ×6 IMPLANT
CNTNR SPEC 2.5X3XGRAD LEK (MISCELLANEOUS) ×2
CONT SPEC 4OZ STER OR WHT (MISCELLANEOUS) ×1
CONT SPEC 4OZ STRL OR WHT (MISCELLANEOUS) ×2
CONTAINER SPEC 2.5X3XGRAD LEK (MISCELLANEOUS) ×2 IMPLANT
COUNTER NEEDLE 20/40 LG (NEEDLE) ×3 IMPLANT
COVER BACK TABLE REUSABLE LG (DRAPES) ×3 IMPLANT
COVER LIGHT HANDLE STERIS (MISCELLANEOUS) ×6 IMPLANT
COVER WAND RF STERILE (DRAPES) ×3 IMPLANT
CUP MEDICINE 2OZ PLAST GRAD ST (MISCELLANEOUS) ×3 IMPLANT
DERMABOND ADVANCED (GAUZE/BANDAGES/DRESSINGS) ×1
DERMABOND ADVANCED .7 DNX12 (GAUZE/BANDAGES/DRESSINGS) ×2 IMPLANT
DRAIN CHANNEL JP 15F RND 16 (MISCELLANEOUS) ×3 IMPLANT
DRAPE C-ARM 42X70 (DRAPES) ×6 IMPLANT
DRAPE C-ARMOR (DRAPES) ×3 IMPLANT
DRAPE LAPAROTOMY 100X77 ABD (DRAPES) ×3 IMPLANT
DRAPE MICROSCOPE SPINE 48X150 (DRAPES) ×6 IMPLANT
DRAPE SURG 17X11 SM STRL (DRAPES) ×3 IMPLANT
DRSG TEGADERM 4X4.75 (GAUZE/BANDAGES/DRESSINGS) ×3 IMPLANT
DURASEAL APPLICATOR TIP (TIP) IMPLANT
DURASEAL SPINE SEALANT 3ML (MISCELLANEOUS) IMPLANT
ELECT CAUTERY BLADE TIP 2.5 (TIP) ×3
ELECT EZSTD 165MM 6.5IN (MISCELLANEOUS) ×6
ELECT REM PT RETURN 9FT ADLT (ELECTROSURGICAL) ×3
ELECTRODE CAUTERY BLDE TIP 2.5 (TIP) ×2 IMPLANT
ELECTRODE EZSTD 165MM 6.5IN (MISCELLANEOUS) ×4 IMPLANT
ELECTRODE REM PT RTRN 9FT ADLT (ELECTROSURGICAL) ×2 IMPLANT
GAUZE SPONGE 4X4 12PLY STRL (GAUZE/BANDAGES/DRESSINGS) ×3 IMPLANT
GLOVE SRG 8 PF TXTR STRL LF DI (GLOVE) ×2 IMPLANT
GLOVE SURG SYN 7.0 (GLOVE) ×6 IMPLANT
GLOVE SURG SYN 8.0 (GLOVE) ×6 IMPLANT
GLOVE SURG UNDER POLY LF SZ7 (GLOVE) ×3 IMPLANT
GLOVE SURG UNDER POLY LF SZ8 (GLOVE) ×3
GOWN STRL REUS W/ TWL LRG LVL3 (GOWN DISPOSABLE) ×2 IMPLANT
GOWN STRL REUS W/ TWL XL LVL3 (GOWN DISPOSABLE) ×4 IMPLANT
GOWN STRL REUS W/TWL LRG LVL3 (GOWN DISPOSABLE) ×3
GOWN STRL REUS W/TWL XL LVL3 (GOWN DISPOSABLE) ×6
GRADUATE 1200CC STRL 31836 (MISCELLANEOUS) ×3 IMPLANT
IMPL TLX20 7X11X26 20D (Cage) ×2 IMPLANT
IV CATH ANGIO 12GX3 LT BLUE (NEEDLE) ×3 IMPLANT
KIT ACCESS MAXCESS MAS TLIF 2 (KITS) ×6 IMPLANT
KIT NEEDLE NVM5 EMG ELECT (KITS) ×2 IMPLANT
KIT NEEDLE NVM5 EMG ELECTRODE (KITS) ×1
KIT SPINAL PRONEVIEW (KITS) ×3 IMPLANT
KIT TURNOVER KIT A (KITS) ×3 IMPLANT
MANIFOLD NEPTUNE II (INSTRUMENTS) ×3 IMPLANT
MARKER SKIN DUAL TIP RULER LAB (MISCELLANEOUS) ×6 IMPLANT
NDL SAFETY ECLIPSE 18X1.5 (NEEDLE) ×2 IMPLANT
NEEDLE HYPO 18GX1.5 SHARP (NEEDLE) ×3
NEEDLE HYPO 22GX1.5 SAFETY (NEEDLE) ×3 IMPLANT
NEEDLE I PASS (NEEDLE) ×6 IMPLANT
NS IRRIG 1000ML POUR BTL (IV SOLUTION) ×3 IMPLANT
PACK LAMINECTOMY NEURO (CUSTOM PROCEDURE TRAY) ×3 IMPLANT
PAD ARMBOARD 7.5X6 YLW CONV (MISCELLANEOUS) ×3 IMPLANT
PROBE BALL TIP NVM5 SNG USE (BALLOONS) ×6 IMPLANT
PUTTY DBM PROPEL MEDIUM (Putty) ×3 IMPLANT
REDUCTION EXT RELINE MAS MOD (Neuro Prosthesis/Implant) ×12 IMPLANT
ROD RELINE MAS LORD 5.5X45MM (Rod) ×3 IMPLANT
ROD RELINE MAS TI 5.5X35MM LRD (Rod) ×3 IMPLANT
SCREW LOCK RELINE 5.5 TULIP (Screw) ×15 IMPLANT
SCREW SHANK MAS MOD 6.5X50MM (Screw) ×3 IMPLANT
SCREW SHANK RELINE 6.5X45MM 2C (Screw) ×9 IMPLANT
SPOGE SURGIFLO 8M (HEMOSTASIS) ×3
SPONGE KITTNER 5P (MISCELLANEOUS) ×3 IMPLANT
SPONGE SURGIFLO 8M (HEMOSTASIS) ×2 IMPLANT
SUT NURALON 4 0 TR CR/8 (SUTURE) IMPLANT
SUT POLYSORB 2-0 5X18 GS-10 (SUTURE) ×12 IMPLANT
SUT VIC AB 0 CT1 18XCR BRD 8 (SUTURE) ×4 IMPLANT
SUT VIC AB 0 CT1 8-18 (SUTURE) ×6
SUT VIC AB 3-0 SH 8-18 (SUTURE) IMPLANT
SYR 10ML LL (SYRINGE) ×6 IMPLANT
SYR 20ML LL LF (SYRINGE) ×3 IMPLANT
SYR 30ML LL (SYRINGE) ×6 IMPLANT
TLX20 IMPLANT 7X11X26 20D (Cage) ×3 IMPLANT
TOWEL OR 17X26 4PK STRL BLUE (TOWEL DISPOSABLE) ×6 IMPLANT
TRAY FOLEY MTR SLVR 16FR STAT (SET/KITS/TRAYS/PACK) ×3 IMPLANT
TUBING CONNECTING 10 (TUBING) ×3 IMPLANT

## 2020-10-12 NOTE — Transfer of Care (Signed)
Immediate Anesthesia Transfer of Care Note  Patient: Nicole Bass  Procedure(s) Performed: OPEN L5/S1 TRANSFORAMINAL LUMBAR INTERBODY FUSION (TLIF) 1 LEVEL (Left ) SYNOVIAL CYST RESECTION (N/A )  Patient Location: PACU  Anesthesia Type:General  Level of Consciousness: awake, drowsy and patient cooperative  Airway & Oxygen Therapy: Patient Spontanous Breathing and Patient connected to face mask oxygen  Post-op Assessment: Report given to RN and Post -op Vital signs reviewed and stable  Post vital signs: Reviewed and stable  Last Vitals:  Vitals Value Taken Time  BP 134/91 10/12/20 1615  Temp 36.4 C 10/12/20 1602  Pulse 101 10/12/20 1622  Resp 12 10/12/20 1622  SpO2 97 % 10/12/20 1622  Vitals shown include unvalidated device data.  Last Pain:  Vitals:   10/12/20 1619  TempSrc:   PainSc: 10-Worst pain ever         Complications: No complications documented.

## 2020-10-12 NOTE — Progress Notes (Signed)
Pt remains to c/o severe pain 6-9/10 despite multiple doses of narcotics.  Dr Lubertha Basque now to bedside.  Per dr Lubertha Basque tylenol, oxycodone, and morphine to be given now and reassess pain.  Pt will drift to sleep occasionally but when she wakes is crying and saying pain is severe.  Dr Lacinda Axon previously at bedside to speak with and assess pt.

## 2020-10-12 NOTE — Progress Notes (Signed)
Pt sleeping.  Appears comfortable. Per dr Lubertha Basque ok to move to room.

## 2020-10-12 NOTE — Anesthesia Postprocedure Evaluation (Signed)
Anesthesia Post Note  Patient: Nicole Bass  Procedure(s) Performed: OPEN L5/S1 TRANSFORAMINAL LUMBAR INTERBODY FUSION (TLIF) 1 LEVEL (Left ) SYNOVIAL CYST RESECTION (N/A )  Patient location during evaluation: PACU Anesthesia Type: General Level of consciousness: awake and alert Pain management: pain level controlled Vital Signs Assessment: post-procedure vital signs reviewed and stable Respiratory status: spontaneous breathing, nonlabored ventilation and respiratory function stable Cardiovascular status: blood pressure returned to baseline and stable Postop Assessment: no apparent nausea or vomiting Anesthetic complications: no   No complications documented.   Last Vitals:  Vitals:   10/12/20 1845 10/12/20 1900  BP: 133/68 112/70  Pulse: (!) 101 98  Resp: 13 13  Temp:  (!) 36.4 C  SpO2: 97% 94%    Last Pain:  Vitals:   10/12/20 1900  TempSrc:   PainSc: Asleep                 Tera Mater

## 2020-10-12 NOTE — Interval H&P Note (Signed)
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  The various methods of treatment have been discussed with the patient and family. After consideration of risks, benefits and other options for treatment, the patient has consented to  Procedure(s): OPEN L5/S1 TRANSFORAMINAL LUMBAR INTERBODY FUSION (TLIF) 1 LEVEL (Left) SYNOVIAL CYST RESECTION (N/A) as a surgical intervention.  The patient's history has been reviewed, patient examined, no change in status, stable for surgery.  I have reviewed the patient's chart and labs.  Questions were answered to the patient's satisfaction.     Deetta Perla

## 2020-10-12 NOTE — H&P (Signed)
Nicole Bass is an 70 y.o. female.   Chief Complaint: Back and leg pain HPI: Nicole Bass returns and have been seen last year for ongoing back and leg pain. She states that the leg pain was intermittent for some time and the back pain did improve with treatments including a nerve block. However, the pain in the leg coming from the buttocks area into the thigh on the left has been increasing over time. Given this, she did get a repeat injection which she states gave about a week of relief. She did perform physical therapy in the fall but she felt this did not help the pain overall. She returns today to discuss other possible treatment options given the ongoing pain.  She denies any numbness in the left leg. She denies any obvious weakness, however the pain has been limiting her activities. She is usually fairly active. She does have a history of rheumatoid arthritis and was on treatment including immunomodulation, however she did develop a fungal infection for which she is being actively treated. She is on chronic prednisone.  She does have a history of compression fractures in the past including an L5 kyphoplasty. She is currently taking a muscle relaxant which she does feel helped overall. She has been on chronic gabapentin.  MRI shows L5/S1 stenosis on left with facet widening and we discussed decompression and fusion at that level. She is here to proceed    Past Medical History:  Diagnosis Date  . Anemia    iron everyday, 3 iron infusions last one August 2020  . Anxiety    nervous  . Arthritis    rheumatoid  . Collagen vascular disease ([place])   . Crohn's disease ([place])   . Crohn's disease ([place])   . DDD (degenerative disc disease), cervical   . DDD (degenerative disc disease), cervical   . DDD (degenerative disc disease), cervical 2003  . DDD (degenerative disc disease), cervical   . DDD (degenerative disc disease), lumbar   . Degenerative disc disease, cervical   . Dyspnea    out  of shape  . GERD (gastroesophageal reflux disease)   . Hand weakness    left hand worse, small tremors  . Headache    with accident  . History of claustrophobia   . History of kidney stones   . HOH (hard of hearing)    left ear  . Hypertension     controlled on meds  . Lymphatic disorder   . Meniere's disease   . Motion sickness   . Nasal fracture 03/27/2020   due to fall  . Neuromuscular disorder ([place])   . Neuropathy    legs  . Peripheral neuropathy   . 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 UTI (urinary tract infection)    HO  . Vertigo    can be accompanied by nausea  . Wears dentures    upper dentures    Past Surgical History:  Procedure Laterality Date  . ABDOMINAL HYSTERECTOMY    . APPENDECTOMY    . BACK SURGERY     x 3  . CERVICAL SPINE SURGERY     metal plate  . [place]   X 2  . CHOLECYSTECTOMY    . CLOSED REDUCTION NASAL FRACTURE N/A 04/09/2020   Procedure: CLOSED REDUCTION NASAL FRACTURE;  Surgeon: Margaretha Sheffield, MD;  Location: [place];  Service: ENT;  Laterality: N/A;  . [place]   removed 18 inches of  colon,removed appendix  . COLONOSCOPY    . dental implants     3 teeth/ wears dentures  . DILATION AND CURETTAGE OF UTERUS  2004  . ELBOW FRACTURE SURGERY Left 2008  . FIBEROPTIC BRONCHOSCOPY N/A 06/01/2020   Procedure: BEDSIDE BRONCHOSCOPY FIBEROPTIC;  Surgeon: Ottie Glazier, MD;  Location: [facility];  Service: Thoracic;  Laterality: N/A;  . FRACTURE SURGERY    . JOINT REPLACEMENT Right 2015   partial knee replacement  . KYPHOPLASTY    . KYPHOPLASTY N/A 04/14/2015   Procedure: KYPHOPLASTY;  Surgeon: Hessie Knows, MD;  Location: [facility];  Service: Orthopedics;  Laterality: N/A;  . KYPHOPLASTY N/A 06/04/2015   Procedure: KYPHOPLASTY L-5;  Surgeon: Hessie Knows, MD;  Location: [facility];  Service: Orthopedics;  Laterality: N/A;  . KYPHOPLASTY    . MEDIAL PARTIAL KNEE REPLACEMENT Right 2015  . NECK/PLATE  4166   . OOPHORECTOMY Bilateral   . ORIF NASAL FRACTURE N/A 04/09/2020   Procedure: OPEN REDUCTION INTERNAL FIXATION (ORIF) NASAL FRACTURE;  Surgeon: Margaretha Sheffield, MD;  Location: [facility];  Service: ENT;  Laterality: N/A;  . REPAIR EXTENSOR TENDON Left 01/08/2019   Procedure: Realignment  EXTENSOR TENDON;  Surgeon: Hessie Knows, MD;  Location: [facility];  Service: Orthopedics;  Laterality: Left;  . REVERSE SHOULDER ARTHROPLASTY Right 04/18/2019   Procedure: REVERSE SHOULDER ARTHROPLASTY;  Surgeon: Corky Mull, MD;  Location: [facility];  Service: Orthopedics;  Laterality: Right;  . SHOULDER ARTHROSCOPY WITH ROTATOR CUFF REPAIR AND SUBACROMIAL DECOMPRESSION Right 04/13/2016   Procedure: SHOULDER ARTHROSCOPY WITH ROTATOR CUFF REPAIR AND SUBACROMIAL DECOMPRESSION, release of long head biceps tendon;  Surgeon: Leanor Kail, MD;  Location: [facility];  Service: Orthopedics;  Laterality: Right;  . TOE SURGERY Right 2013  . TONSILLECTOMY  1995  . UPPER GI ENDOSCOPY    . WRIST FRACTURE SURGERY Bilateral     Family History  Problem Relation Age of Onset  . Diabetes Mother   . Emphysema Mother   . Emphysema Father   . Colon cancer Maternal Grandfather   . Breast cancer Neg Hx   . Ovarian cancer Neg Hx    Social History:  reports that she has never smoked. She has never used smokeless tobacco. She reports that she does not drink alcohol and does not use drugs.  Allergies:  Allergies  Allergen Reactions  . Ciprofloxacin Itching and Rash  . Levofloxacin Hives and Rash  . Methotrexate     Other reaction(s): Other (See Comments) Mouth Ulcers/[place] Mouth and throat ulcers  . Methotrexate Derivatives     Mouth and throat ulcers  . Other Other (See Comments) and Rash    Other reaction(s): Other (See Comments), Unknown Per pts MD she is unable to take this medication because it is contraindicated with her other medications. Per pts MD she is unable to take this medication because it is  contraindicated with her other medications.  Other reaction(s): Other (See Comments), Unknown Other reaction(s): Other (See Comments), Unknown Per pts MD she is unable to take this medication because it is contraindicated with her other medications. Per pts MD she is unable to take this medication because it is contraindicated with her other medications. Other reaction(s): Other (See Comments), Unknown Other reaction(s): Other (See Comments), Unknown Per pts MD she is unable to take this medication because it is contraindicated with her  Per pts MD she is unable to take this medication because it is contraindicated with her other medications. Other reaction(s): Other (See Comments)  Other reaction(s): Other (See Comments), Unknown Per pts MD she is unable to take this medication because it is contraindicated with her other medications. Per pts MD she is unable to take this medication because it is contraindicated with her other medications.  Other reaction(s): Other (See Comments), Unknown Other reaction(s): Other (See Comments), Unknown Per pts MD she is unable to take this medication because it is contraindicated with her other medications. Per pts MD she is unable to take this medication because it is contraindicated with her other medications. Other reaction(s): Other (See Comments), Unknown Other reaction(s): Other (See Comments), Unknown Per pts MD she is unable to take this medication because it is contraindicated with her  Per pts MD she is unable to take this medication because it is contraindicated with her other medications.  . Sulfa Antibiotics Other (See Comments)    Per pts MD she is unable to take this medication because it is contraindicated with her other medications.  . Trelegy Ellipta [Fluticasone-Umeclidin-Vilant]     Laryngitis   . Cefazolin Itching and Rash  . Cefprozil Rash  . Cephalosporins Itching and Rash  . Enbrel [Etanercept] Itching and Rash   . Humira [Adalimumab] Itching and Rash  . Hydrocodone-Acetaminophen Itching and Other (See Comments)    Itching only  . Lorabid [Loracarbef] Itching and Rash  . Meropenem Itching and Rash  . Oxycodone Itching, Other (See Comments) and Rash    Other reaction(s): Other (See Comments)  . Oxycodone-Acetaminophen Itching  . Primidone Itching and Rash    Medications Prior to Admission  Medication Sig Dispense Refill  . acetaminophen (TYLENOL) 650 MG CR tablet Take 1,300 mg by mouth every 8 (eight) hours as needed for pain.    Marland Kitchen alendronate (FOSAMAX) 70 MG tablet Take 70 mg by mouth every Monday. Take with a full glass of water on an empty stomach.    Marland Kitchen aspirin EC 81 MG tablet Take 81 mg by mouth daily. Swallow whole.    . Baclofen 5 MG TABS Take 5 mg by mouth every 6 (six) hours.    . cetirizine (ZYRTEC) 10 MG tablet Take 10 mg by mouth daily.    . chlorhexidine (PERIDEX) 0.12 % solution Use as directed 15 mLs in the mouth or throat as needed.     . cholecalciferol (VITAMIN D3) 25 MCG (1000 UT) tablet Take 1,000 Units by mouth daily.    . cyanocobalamin (,VITAMIN B-12,) 1000 MCG/ML injection Inject 1,000 mcg into the muscle every 30 (thirty) days.    . diazepam (VALIUM) 10 MG tablet Take 1 tablet (10 mg total) by mouth daily as needed (vaginal pain with intercourse, use before sexual activity per vagina). (Patient taking differently: Take 10 mg by mouth as needed (vaginal pain with intercourse, use before sexual activity per vagina).) 30 tablet 0  . diphenoxylate-atropine (LOMOTIL) 2.5-0.025 MG per tablet Take 1 tablet by mouth daily. am    . fluconazole (DIFLUCAN) 200 MG tablet Take 400 mg by mouth daily.    . furosemide (LASIX) 20 MG tablet Take 20 mg by mouth daily. am    . gabapentin (NEURONTIN) 100 MG capsule Take 100 mg by mouth every morning.    . gabapentin (NEURONTIN) 400 MG capsule Take 400 mg by mouth at bedtime.    . lidocaine (XYLOCAINE) 2 % solution Use as directed 15 mLs in the  mouth or throat as needed for mouth pain.    . magnesium oxide (MAG-OX) 400 MG tablet Take 400 mg by  mouth every Monday, Wednesday, and Friday. 3 times per week    . Multiple Vitamin (MULTIVITAMIN WITH MINERALS) TABS tablet Take 1 tablet by mouth daily.    Marland Kitchen omeprazole (PRILOSEC) 40 MG capsule Take 40 mg by mouth daily before breakfast.     . potassium chloride SA (K-DUR,KLOR-CON) 20 MEQ tablet Take 20 mEq by mouth every Monday, Wednesday, and Friday.    . predniSONE (DELTASONE) 1 MG tablet Take 3 mg by mouth daily with breakfast.     . Probiotic Product (PROBIOTIC PO) Take 1 capsule by mouth daily.     . sucralfate (CARAFATE) 1 g tablet Take 0.5 g by mouth 3 (three) times daily with meals as needed (for stomach cramping (crohn's disease)).     Marland Kitchen telmisartan (MICARDIS) 20 MG tablet Take 20 mg by mouth every Monday, Wednesday, and Friday.    . traMADol (ULTRAM) 50 MG tablet Take 1 tablet (50 mg total) by mouth every 6 (six) hours as needed. (Patient taking differently: Take 50 mg by mouth every 6 (six) hours as needed for severe pain.) 15 tablet 0  . betamethasone dipropionate 0.05 % lotion Apply topically 2 (two) times daily as needed (Rash). Apply thin coat to affected areas twice daily for 2 weeks (Patient not taking: Reported on 09/24/2020) 60 mL 0  . rOPINIRole (REQUIP) 1 MG tablet Take 1 mg by mouth at bedtime as needed (for restless legs).       No results found for this or any previous visit (from the past 48 hour(s)). No results found.  Review of Systems General ROS: Negative Psychological ROS: Negative Ophthalmic ROS: Negative ENT ROS: Negative Hematological and Lymphatic ROS: Negative  Endocrine ROS: Negative Respiratory ROS: Negative Cardiovascular ROS: Negative Gastrointestinal ROS: Negative Genito-Urinary ROS: Negative Musculoskeletal ROS: Positive for back pain Neurological ROS: Positive for leg pain Dermatological ROS: Negative   Blood pressure (!) 141/75, pulse (!)  101, temperature 97.8 F (36.6 C), temperature source Oral, resp. rate 16, height 5' 2"  (1.575 m), weight 79.4 kg, SpO2 99 %. Physical Exam  General appearance: Alert, cooperative, in no acute distress Head: Normocephalic, atraumatic Eyes: Normal, EOM intact Oropharynx: Wearing facemask CV: Regular rate and rhythm Pulm: Clear to auscultation Ext: No edema in LE bilaterally  Neurologic exam:  Mental status: alertness: alert, affect: normal Speech: fluent and clear Motor:strength symmetric 5/5 in bilateral lower extremities in all motor groups Sensory: intact to light touch in bilateral lower extremities Gait: Antalgic appearing gait   Assessment/Plan L5-S1 TLIF  And decompression    Deetta Perla, MD 10/12/2020, 10:08 AM

## 2020-10-12 NOTE — Anesthesia Preprocedure Evaluation (Signed)
Anesthesia Evaluation  Patient identified by MRN, date of birth, ID band Patient awake    Reviewed: Allergy & Precautions, NPO status , Patient's Chart, lab work & pertinent test results  History of Anesthesia Complications Negative for: history of anesthetic complications  Airway Mallampati: III       Dental  (+) Upper Dentures   Pulmonary neg sleep apnea, neg COPD, Not current smoker,           Cardiovascular hypertension, Pt. on medications (-) Past MI and (-) CHF (-) dysrhythmias (-) Valvular Problems/Murmurs     Neuro/Psych neg Seizures Anxiety Depression    GI/Hepatic Neg liver ROS, GERD  Medicated and Controlled,  Endo/Other  diabetes, Type 2, Oral Hypoglycemic AgentsHyperthyroidism   Renal/GU negative Renal ROS     Musculoskeletal   Abdominal   Peds  Hematology   Anesthesia Other Findings   Reproductive/Obstetrics                             Anesthesia Physical Anesthesia Plan  ASA: III  Anesthesia Plan: General   Post-op Pain Management:    Induction: Intravenous  PONV Risk Score and Plan: 3 and Ondansetron, Dexamethasone and Treatment may vary due to age or medical condition  Airway Management Planned: Oral ETT  Additional Equipment:   Intra-op Plan:   Post-operative Plan:   Informed Consent: I have reviewed the patients History and Physical, chart, labs and discussed the procedure including the risks, benefits and alternatives for the proposed anesthesia with the patient or authorized representative who has indicated his/her understanding and acceptance.       Plan Discussed with:   Anesthesia Plan Comments:         Anesthesia Quick Evaluation

## 2020-10-12 NOTE — Anesthesia Procedure Notes (Signed)
Procedure Name: Intubation Performed by: Kelton Pillar, CRNA Pre-anesthesia Checklist: Patient identified, Emergency Drugs available, Suction available and Patient being monitored Patient Re-evaluated:Patient Re-evaluated prior to induction Oxygen Delivery Method: Circle system utilized Preoxygenation: Pre-oxygenation with 100% oxygen Induction Type: IV induction Ventilation: Mask ventilation without difficulty Laryngoscope Size: McGraph and 3 Grade View: Grade I Tube type: Oral Tube size: 6.5 mm Number of attempts: 1 Airway Equipment and Method: Stylet and Oral airway Placement Confirmation: ETT inserted through vocal cords under direct vision,  positive ETCO2,  breath sounds checked- equal and bilateral and CO2 detector Tube secured with: Tape Dental Injury: Teeth and Oropharynx as per pre-operative assessment

## 2020-10-12 NOTE — Op Note (Signed)
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)   POST-OP DIAGNOSIS: Post-Op Diagnosis Codes:  Lumbar Stenosis with Lumbar Radiculopathy (m48.062)   Procedure(s) with comments:  Left L5/S1 Transforaminal Interbody Fusion, Facetectomy, Laminectomy with Discectomy  Synovial Cyst Resection Pedicle Screw Fixation with Rod Placement  Autograft and Allograft   ARTHRODESIS, COMBINED POSTERIOR OR POSTEROLATERAL TECHNIQUE W/POSTERIOR INTERBODY TECHNIQUE INCL LAMINECTOMY AND/OR DISCECTOMY SUFFICIENT TO PREPARE INTERSPACE, SINGLE INTERSPACE AND SEGMENT; LUMBAR---L4/5 TLIF WITH LAMINECTOMIES  POSTERIOR NONSEGMENTAL INSTRUMENT, TECHNI, PEDICLE FIX 1 INTERSPACE, ATANTOAXIAL TRANSARTIC SCREW FIX, SUBLAMINAR WIRING C1, FACET SCREW FIX) (LIST IN ADDITION CODE FOR PRIMARY PROCEDURE)  INSERTION INTERBODY BIOMECHANICAL DEVICE WITH INTEGRAL ANTERIOR INSTRUMENTATION, TO INTERVERTEBRAL DISC SPACE IN CONJUNCTION INTERBODY ARTHRODESIS, EACH INTERSPACE (LIST CODE FOR PRIMARY PROCEDURE)  ALLOGRAFT, MORSELIZED, OR PLACEMENT OF OSTEOPROMOTIVE MATERIAL, FOR SPINE SURGERY ONLY (LIST IN ADDITION TO PRIMARY PROCEDURE)  AUTOGRAFT FOR SPINE SURGERY ONLY (INCL HARVESTING GRAFT); LOCAL (EG, RIBS, SPINOUS PROCESS, OR LAMINAR FRAGMENT) OBTAINED FROM SAME INCISION (LIST IN ADDITION TO PRIMARY PROCEDURE)   SURGEON:  * Malen Gauze, MD  Liliane Bade, PA  ANESTHESIA: General   OPERATIVE FINDINGS: Synovial Cyst 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 She was found on imaging to have stenosis at L5/S1 on MRI. Given previously failed conservative management including OTC medications, precription medications, injections, and physical therapy, we discussed need for removal of facet and stabilization given the synovial cyst. The risks of surgery were  explained to include hematoma, infection, damage to nerve roots, CSF leak, weakness, numbness, pain, failure of fusion, need for future surgery, heart attack, and stroke. She elected to proceed with surgery for symptom relief.   Procedure  The patient was brought to the OR after informed consent was obtained. She was given general anesthesia and intubated by the anesthesia service. Vascular access lines and a foley catheter were placed. Neuromonitoring electrodes were placed for EMG in lower extremities. The patient was then placed prone on a Jackson table ensuring all pressure points were padded. A time-out was performed per protocol.   The patient was sterilely prepped and draped. The fluoroscopy was used to identify the L5/S1 level and the pedicles were marked. Incisions were planned in paramedian fashion using fluoroscopy to identify the pedicle line.  Local anesthetic with epinephrine was instilled into the planned incisions. The skin was opened sharply and the dissection taken to the fascia. This was incised with cautery.   Using fluoroscopy, an entry point as placed at the pedicle at L5 and a Jamshidi was advanced using monitoring to a depth of 3 cm ensuring we were past the pedicle/body interface. There was no stimulation below 37m on any screw. Once the Jamshidi was placed, a k-wire was placed through this and then a 5.5 mm tap used with monitoring to prepare for the screw. Once this was performed at the L5 and S1 levels bilaterally, the screws were placed. On the left we placed 6.5 mm x 45 mm screws and on the right we placed 6.563mx 4565mt L5 and 6.5mm56m50mm49mS1. We confirmed placement with fluoroscopy and then stimulated and all screws stimulated above 20mA.63mhe screws on the left had the retractor blades attached and the TLIF adaptor was attached.Next, the lamina of L5 was identified and the space distracted. The muscle was cleared from the facet and lamina. Next,  the drill was used  to remove the lamina and working to the inferior facet of L5. The ligament was identified beneath this. Next, The superior facet of S1 was removed and then the ligament removed beneath this to expose the dura medially and the exiting L5 nerve root.  There was seen to be a large synovial cyst at this point.  A plane was attempted to be dissected medially but was not clear.  The cyst was entered and decompressed.  This continued laterally and inferiorly.  The L5 nerve root was skeletonized.  Medially, we worked underneath the dura in order to remove more cyst material.  At this point, the dura started to appear decompressed.  The disc space was identified and all ligament overlying the traversing nerve root removed. The disc space was coagulated and then entered sharply.  The dura was retracted medially and the L5 nerve root protected. The disc space was cleared with combination of shavers and curettes. Once clear, a 7-95m expandable graft was placed within the disc space and placed in the midline anteriorly to promote arthrodesis. Fluoroscopy confirmed placement.  Bone graft was then placed within the disc space and interbody graft.   The retractors were then removed and the left side screws advanced. Next, a  35 mm rod was placed on left with 45 mm rod on the right and set screws were placed and torqued bilaterally. Fluoroscopy confirmed good placement of the rods and adequate length.   The wounds were irrigated with saline and hemostasis was achieved.  A drain was exited through the skin on the left. The fascia was closed with 0 vicryl bilaterally. Vancomycin powder was placed.  Next, multiple subcutaneous and dermal layers were closed with 2-0 vicryl until the epidermis was well approximated. The skin was closed with Dermabond. The drain was secured with suture.   The patient was returned to supine position and extubated by the anesthesia service. The patient was then taken to the PACU for  post-operative care where she was moving extremities symmetrically. I discussed the results with the family and all questions were answered.   ESTIMATED BLOOD LOSS:  300 cc  SPECIMENS  None   IMPLANT  REDUCTION EXT RELINE MAS MOD - [id number] Inventory Item: REDUCTION EXT RELINE MAS MOD Serial no.:  Model/Cat no.: [id number] Implant name: REDUCTION EXT RELINE MAS MOD - [id number]: N/A Area: Spine Lumbar  Manufacturer: [employer] Date of Manufacture:    Action: Temporary Fixation (Not Implanted) Number Used: 4   Device Identifier:  Device Identifier Type:     SCREW SHANK RELINE 6.5X45MM 2C - [id number] Inventory Item: SCREW SHANK RELINE 6.5X45MM 2C Serial no.:  Model/Cat no.: [id number] Implant name: SNydia BoutonRELINE 6.5X45MM 2C - [id number]: N/A Area: Spine Lumbar  Manufacturer: [employer] Date of Manufacture:    Action: Implanted Number Used: 3   Device Identifier:  Device Identifier Type:     SCREW SHANK MAS MOD 6.5X50MM - [id number] Inventory Item: SCREW SHANK MAS MOD 6.5X50MM Serial no.:  Model/Cat no.: [id number] Implant name: SNydia BoutonMAS MOD 6.5X50MM - [id number]: N/A Area: Spine Lumbar  Manufacturer: [employer] Date of Manufacture:    Action: Implanted Number Used: 1   Device Identifier:  Device Identifier Type:     PUTTY PROPEL MEDIUM - [id number] Inventory Item: PUTTY PROPEL MEDIUM Serial no.:  Model/Cat no.: [id number] Implant name: PKnox RoyaltyMEDIUM - [id number]: N/A Area: Spine Lumbar  Manufacturer: [employer] Date of Manufacture:    Action: Implanted Number Used: 1   Device Identifier:  Device Identifier Type:     TLX20 IMPLANT [id number] 20D - [id number]  Inventory Item: TLX20 IMPLANT [id number] 20D Serial no.:  Model/Cat no.: [id number]  Implant name: TLX20 IMPLANT [id number] 20D - [id number] Laterality: N/A Area: Spine Lumbar  Manufacturer: [employer] Date of Manufacture:    Action: Implanted Number Used: 1    Device Identifier:  Device Identifier Type:     ROD RELINE MAS LORD 5.5X45MM - [id number]  Inventory Item: ROD RELINE MAS LORD 5.5X45MM Serial no.:  Model/Cat no.: [id number]  Implant name: ROD RELINE MAS LORD 5.5X45MM - [id number] Laterality: Left Area: Spine Lumbar  Manufacturer: [employer] Date of Manufacture:    Action: Implanted Number Used: 1   Device Identifier:  Device Identifier Type:     ROD RELINE MAS TI 5.5X35MM LRD - [id number]  Inventory Item: ROD RELINE MAS TI 5.5X35MM LRD Serial no.:  Model/Cat no.: [id number]  Implant name: ROD RELINE MAS TI 5.5X35MM LRD - [id number] Laterality: Left Area: Spine Lumbar  Manufacturer: [employer] Date of Manufacture:    Action: Implanted Number Used: 1   Device Identifier:  Device Identifier Type:     SCREW LOCK RELINE 5.5 TULIP - [id number]  Inventory Item: SCREW LOCK RELINE 5.5 TULIP Serial no.:  Model/Cat no.: [id number]  Implant name: SCREW LOCK RELINE 5.5 [place] - [id number] Laterality: Left Area: Spine Lumbar  Manufacturer: [employer] Date of Manufacture:    Action: Implanted Number Used: 5   Device Identifier:  Device Identifier Type:        I performed the case in its entirety with assistance of PA, [facility] Deetta Perla,

## 2020-10-12 NOTE — Progress Notes (Signed)
PHARMACY -  BRIEF ANTIBIOTIC NOTE   Pharmacy has received consult(s) for Cefazolin from an OR provider.  The patient's profile has been reviewed for ht/wt/allergies/indication/available labs.    One time order(s) placed for Cefazolin 2 gm based on pt wt 81.2kg.  Pt with med allergies to cephalosporins and meropenem causing itching, rash.  Added comment to order to monitor pt for symptoms of allergic reaction or consider alternative pre-op med such as Vancomycin 1gm.  Further antibiotics/pharmacy consults should be ordered by admitting physician if indicated.       Renda Rolls, PharmD, [facility] 10/12/2020 1:52 AM

## 2020-10-13 ENCOUNTER — Inpatient Hospital Stay: Payer: Medicare Other

## 2020-10-13 ENCOUNTER — Encounter: Payer: Self-pay | Admitting: Neurosurgery

## 2020-10-13 MED ORDER — MORPHINE SULFATE (PF) 2 MG/ML IV SOLN
INTRAVENOUS | Status: AC
  Administered 2020-10-13: 2 mg via INTRAVENOUS
  Filled 2020-10-13: qty 1

## 2020-10-13 MED ORDER — TRAMADOL HCL 50 MG PO TABS
ORAL_TABLET | ORAL | Status: AC
  Administered 2020-10-13: 100 mg via ORAL
  Filled 2020-10-13: qty 2

## 2020-10-13 MED ORDER — GABAPENTIN 100 MG PO CAPS
ORAL_CAPSULE | ORAL | Status: AC
  Administered 2020-10-13: 100 mg via ORAL
  Filled 2020-10-13: qty 1

## 2020-10-13 MED ORDER — TRAMADOL HCL 50 MG PO TABS
ORAL_TABLET | ORAL | Status: AC
  Administered 2020-10-13: 50 mg via ORAL
  Filled 2020-10-13: qty 1

## 2020-10-13 MED ORDER — MORPHINE SULFATE (PF) 2 MG/ML IV SOLN
INTRAVENOUS | Status: AC
  Filled 2020-10-13: qty 1

## 2020-10-13 MED ORDER — ENOXAPARIN SODIUM 40 MG/0.4ML ~~LOC~~ SOLN
SUBCUTANEOUS | Status: AC
  Filled 2020-10-13: qty 0.4

## 2020-10-13 MED ORDER — GABAPENTIN 400 MG PO CAPS
ORAL_CAPSULE | ORAL | Status: AC
  Administered 2020-10-13: 400 mg via ORAL
  Filled 2020-10-13: qty 1

## 2020-10-13 MED ORDER — ACETAMINOPHEN 325 MG PO TABS
ORAL_TABLET | ORAL | Status: AC
  Administered 2020-10-13: 650 mg via ORAL
  Filled 2020-10-13: qty 2

## 2020-10-13 NOTE — Progress Notes (Signed)
Nutrition Brief Note  Patient identified on the Malnutrition Screening Tool (MST) Report  Wt Readings from Last 15 Encounters:  10/12/20 79.4 kg  10/02/20 81.2 kg  07/07/20 82.7 kg  06/29/20 82.7 kg  06/17/20 83.5 kg  06/16/20 83.9 kg  06/02/20 84.6 kg  06/01/20 86.2 kg  05/27/20 84.8 kg  04/09/20 84.7 kg  03/27/20 86.6 kg  03/26/20 86.5 kg  12/30/19 86.9 kg  09/24/19 86.6 kg  06/19/19 33.43 kg   70 year old female with PMHx of Crohn's disease, GERD, HTN, arthritis, DDD, anxiety, lumbar stenosis with lumbar radiculopathy admitted s/p left L5/S1 transforaminal interbody fusion, facetectomy, laminectomy with discectomy, synovial cyst resection, pedicle screw fixation with rod placement on 2/28.  Met with patient at bedside in peri-op 24 overflow. She reports she recently had COVID in January 2022 and for a short time had decreased appetite. She reports her appetite since has been good and she eats well at baseline. She reports her appetite is good here as well and she feels she will be able to eat well here. She denies any nutritional needs at this time. Patient reports her UBW was 189 lbs and she lost down to approximately 169 lbs. However, do not see weight that high in chart. Per chart patient was 84.6 kg on 06/02/2020. She is now documented to be 79.4 kg. She has lost 5.2 kg (6.1% body weight) over the past 4.5 months, which is not significant for time frame. No muscle or subcutaneous fat depletions noted on Nutrition-Focused Physical Exam. Patient does not meet criteria for malnutrition at this time. Patient's diet was advanced to regular this morning. Her breakfast had just arrived prior to RD assessment so patient had not eaten yet.  Body mass index is 32.01 kg/m. Patient meets criteria for obesity class I based on current BMI.   Current diet order is regular. Labs and medications reviewed.   No nutrition interventions warranted at this time. If nutrition issues arise, please  consult RD.   Jacklynn Barnacle, MS, RD, LDN Pager number available on Amion

## 2020-10-13 NOTE — TOC Progression Note (Signed)
Transition of Care [facility]) - Progression Note    Patient Details  Name: Nicole Bass MRN: [id number] Date of Birth: May 11, 1951  Transition of Care [facility]) CM/SW Contact  Eileen Stanford, LCSW Phone Number: 10/13/2020, 1:59 PM  Clinical Narrative:  Pt is active with [facility]. Joelene Millin aware of hospitalization. Pt will resume service at dc.          Expected Discharge Plan and Services                                                 Social Determinants of Health (SDOH) Interventions    Readmission Risk Interventions No flowsheet data found.

## 2020-10-13 NOTE — OR Nursing (Signed)
Both patient and husband concerned about pain control once patient is discharged home.  Husband left a list of concerns and questions for Dr. Lacinda Axon &/or his PA/NP.  Husband wants to be called if not present when they arrive in a.m. to discuss his concerns.

## 2020-10-13 NOTE — Evaluation (Signed)
Occupational Therapy Evaluation Patient Details Name: Nicole Bass MRN: [id number] DOB: 1950-10-20 Today's Date: 10/13/2020    History of Present Illness 70 y/o female with progrssively limiting back and L LE pain.  S/p left L5/S1 Transforaminal Interbody Fusion, Facetectomy, Laminectomy with Discectomy 10/12/20.   Clinical Impression   Pt seen for OT evaluation this date, POD#1 from above lumbar surgery. Prior to hospital admission, pt was modified independent with mobility with AD and ADL/IADL. However, has been having increased difficulty with all aspects of mobility and ADL due to low back pain. Pt lives with spouse in a single family home with 3 steps to enter and bilat handrails with spouse able to provide PRN assist/support as needed for pt. Currently pt requires CGA for ADL transfers with RW and cues for mgt/hand placement, MIN-MOD A for LB ADL tasks, and MIN A for back brace application. Pt initially groggy, recently medicated. Pt educated in back precautions with handout provided, self care skills, bed mobility and functional transfer training, AE/DME for bathing, dressing, and toileting needs, and home/routines modifications and falls prevention strategies to maximize safety and functional independence while minimizing falls risk and maintaining precautions. Pt verbalized understanding of all education/training provided. Handout provided to support recall and carry over of learned precautions/techniques for bed mobility, functional transfers, and self care skills. Pt will benefit from additional OT services while hospitalized to maximize return to PLOF. Upon hospital discharge, pt safe to discharge home.    Follow Up Recommendations  No OT follow up    Equipment Recommendations  3 in 1 bedside commode    Recommendations for Other Services       Precautions / Restrictions Precautions Precautions: Fall;Back Precaution Booklet Issued: Yes (comment) Required Braces or Orthoses:  Spinal Brace Spinal Brace: Lumbar corset;Applied in sitting position Restrictions Weight Bearing Restrictions: No      Mobility Bed Mobility Overal bed mobility: Modified Independent             General bed mobility comments: cues for log roll with good carryover    Transfers Overall transfer level: Needs assistance Equipment used: Rolling walker (2 wheeled) Transfers: Sit to/from Stand Sit to Stand: Min guard         General transfer comment: cues for hand placement    Balance Overall balance assessment: Modified Independent                                         ADL either performed or assessed with clinical judgement   ADL Overall ADL's : Needs assistance/impaired                                       General ADL Comments: Min-MOD A for LB ADL tasks, Min A for brace mgt, CGA for ADL transfers with RW     Vision Patient Visual Report: No change from baseline       Perception     Praxis      Pertinent Vitals/Pain Pain Assessment: 0-10 Pain Score: 8  Pain Location: low back Pain Descriptors / Indicators: Aching;Guarding Pain Intervention(s): Limited activity within patient's tolerance;Monitored during session;Premedicated before session;Repositioned;Patient requesting pain meds-RN notified     Hand Dominance Right   Extremity/Trunk Assessment Upper Extremity Assessment Upper Extremity Assessment: Generalized weakness;Overall [facility] for tasks assessed   Lower  Extremity Assessment Lower Extremity Assessment: Overall WFL for tasks assessed       Communication Communication Communication: No difficulties   Cognition Arousal/Alertness: Lethargic;Suspect due to medications Behavior During Therapy: [facility] for tasks assessed/performed Overall Cognitive Status: Within Functional Limits for tasks assessed                                 General Comments: groggy, premedicated, requiring cues to improve  alertness initially   General Comments       Exercises Other Exercises Other Exercises: Pt instructed in back precautions and how to maintain during ADL and IADL Tasks, RW mgt for ADL transfers, AE/DME, self care skills, and back brace mgt; handout provided, pt verbalized understanding   Shoulder Instructions      Home Living Family/patient expects to be discharged to:: Private residence Living Arrangements: Spouse/significant other Available Help at Discharge: Family;Available PRN/intermittently (husband at work t/o the day)   Home Access: Stairs to enter Technical brewer of Steps: 3 Entrance Stairs-Rails: Can reach both [place]: One level     Bathroom Shower/Tub: Occupational psychologist: Handicapped height     Home Equipment: [place] held shower head;Adaptive equipment Adaptive Equipment: [place]        Prior Functioning/Environment Level of Independence: Independent        Comments: Pt reports she has been limping recently, but has not needed to use AD, indep with ADL        OT Problem List: Decreased strength;Pain;Decreased range of motion;Impaired balance (sitting and/or standing);Decreased knowledge of use of DME or AE;Decreased knowledge of precautions      OT Treatment/Interventions: Self-care/ADL training;Therapeutic exercise;Therapeutic activities;DME and/or AE instruction;Patient/family education;Balance training    OT Goals(Current goals can be found in the care plan section) Acute Rehab OT Goals Patient Stated Goal: go home OT Goal Formulation: With patient Time For Goal Achievement: 10/27/20 Potential to Achieve Goals: Good ADL Goals Pt Will Perform Lower Body Dressing: with modified independence;with adaptive equipment;sit to/from stand Pt Will Transfer to Toilet: with modified independence;ambulating (BSC over toilet vs elevated commode, LRAD for amb, back brace applied) Additional ADL Goal #1: Pt will independently  don/doff back brace after set up without cues for technique/positioning/wear schedule  OT Frequency: Min 2X/week   Barriers to D/C:            Co-evaluation              AM-PAC OT "6 Clicks" Daily Activity     Outcome Measure Help from another person eating meals?: None Help from another person taking care of personal grooming?: None Help from another person toileting, which includes using toliet, bedpan, or urinal?: A Little Help from another person bathing (including washing, rinsing, drying)?: A Little Help from another person to put on and taking off regular upper body clothing?: None Help from another person to put on and taking off regular lower body clothing?: A Little 6 Click Score: 21   End of Session Equipment Utilized During Treatment: Gait belt;Rolling walker;Back brace Nurse Communication: Patient requests pain meds  Activity Tolerance: Patient tolerated treatment well Patient left: in bed;with call bell/phone within reach;with bed alarm set  OT Visit Diagnosis: Other abnormalities of gait and mobility (R26.89);Muscle weakness (generalized) (M62.81);Pain Pain - Right/Left:  (lower back)                Time: 0960-4540 OT Time Calculation (min): 43 min Charges:  OT General Charges $OT Visit: 1 Visit OT Evaluation $OT Eval Moderate Complexity: 1 Mod OT Treatments $Self Care/Home Management : 23-37 mins $Therapeutic Activity: 8-22 mins  Hanley Hays, MPH, MS, OTR/L ascom [phone] 10/13/20, 1:48 PM

## 2020-10-13 NOTE — Progress Notes (Signed)
Progress Note   Date: 10/13/2020  POD#1 L5/S1 TLIF  24 Hour events: Ms Nicole Bass is doing well after surgery. She endorses expected back pain but denies leg pain or numbness. Her drain put out 45 cc. She is starting to tolerate diet. Her foley catheter is in place. She is getting pain control with pain medication.     Vital Signs: Temp:  [97.1 F (36.2 C)-98.9 F (37.2 C)] 98 F (36.7 C) (03/01 1200) Pulse Rate:  [84-113] 84 (03/01 1200) Resp:  [12-23] 18 (03/01 1200) BP: (102-135)/(57-100) 110/62 (03/01 1200) SpO2:  [88 %-100 %] 91 % (03/01 1200) Temp (24hrs), Avg:97.8 F (36.6 C), Min:97.1 F (36.2 C), Max:98.9 F (37.2 C)  Weight: 79.4 kg   Problem List Patient Active Problem List   Diagnosis Date Noted  . Lumbar stenosis 10/12/2020  . Swelling of limb 06/02/2020  . Cervical spondylosis 05/01/2020  . Atherosclerosis of native coronary artery of native heart without angina pectoris 04/27/2020  . Numbness and tingling in left hand 10/29/2019  . 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 Status post right partial knee replacement 02/12/2019  . Rotator cuff arthropathy, right 01/25/2019  . Iron deficiency anemia 09/13/2018  . Anemia, unspecified 09/09/2018  . Iron deficiency 09/04/2018  . Acute kidney injury ([place]) 08/23/2018  . HTN, goal below 140/80 06/04/2018  . Hyperthyroidism 02/08/2018  . Imbalance 12/18/2017  . Stomatitis, ulcerative 09/06/2017  . Dyspareunia in female 08/22/2017  . Vaginal atrophy 08/22/2017  . Obesity (BMI 30.0-34.9) 08/22/2017  . Varicose veins of leg with pain, left 04/05/2017  . Peripheral polyneuropathy 04/05/2017  . Lumbar radiculopathy 04/05/2017  . Tremor 02/13/2017  . Sensory neuropathy 02/13/2017  . Restless leg syndrome 02/13/2017  . Impingement syndrome of left shoulder 09/29/2016  . Acute diarrhea 09/06/2016  . History of pyelonephritis 09/06/2016  . Hypokalemia 09/06/2016  . 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 Pyelonephritis 08/29/2016  . Rheumatoid arthritis, unspecified ([place]) 08/29/2016  . Nontraumatic complete tear of right rotator cuff 03/08/2016  . Acute pain of right shoulder 01/27/2016  . Type 2 diabetes mellitus with diabetic neuropathy ([place]) 10/27/2015  . Closed fracture of distal end of right radius with routine healing 09/17/2015  . Crohn's colitis ([place]) 09/04/2015  . Acute bilateral low back pain without sciatica 03/23/2015  . SOB (shortness of breath) on exertion 01/15/2015  . Pedal edema 01/15/2015  . Status post open reduction with internal fixation of fracture 11/27/2014  . Aftercare for healing traumatic fracture of lower arm 09/25/2014  . Painful rib 09/11/2014  . Primary osteoarthritis of left knee 04/24/2014  . Osteoporosis 03/17/2014  . Depression 03/17/2014  . B12 deficiency 03/17/2014  . Crohn's disease, unspecified, without complications ([place]) 90/24/0973  . Urolith 07/16/2013  . Right flank pain 07/16/2013  . Abdominal pain 07/16/2013    Medications: Scheduled Meds: . acidophilus  1 capsule Oral Daily  . baclofen  5 mg Oral Q6H  . cholecalciferol  1,000 Units Oral Daily  . enoxaparin (LOVENOX) injection  40 mg Subcutaneous Q24H  . fluconazole  200 mg Oral Daily  . furosemide  20 mg Oral Daily  . gabapentin  100 mg Oral BH-q7a  . gabapentin  400 mg Oral QHS  . irbesartan  75 mg Oral Daily  . loratadine  10 mg Oral Daily  . morphine      . multivitamin with minerals  1 tablet Oral Daily  . pantoprazole  40 mg Oral Daily  .  potassium chloride SA  20 mEq Oral Q M,W,F  . predniSONE  3 mg Oral Q breakfast  . senna  1 tablet Oral BID  . sodium chloride flush  3 mL Intravenous Q12H   Continuous Infusions: . sodium chloride     PRN Meds:.acetaminophen **OR** acetaminophen, diphenoxylate-atropine, lidocaine, menthol-cetylpyridinium **OR** phenol, morphine injection, ondansetron **OR** ondansetron (ZOFRAN) IV, rOPINIRole, sodium  chloride flush, sucralfate, traMADol  Labs:  Lab Results  Component Value Date   WBC 11.7 (H) 10/12/2020   WBC 7.5 07/24/2020   HCT 26.0 (L) 10/12/2020   HCT 36.7 07/24/2020   HCT 34.1 (L) 11/25/2013   HCT 32.8 (L) 10/19/2013   PLT 336 10/12/2020   PLT 249 07/24/2020   PLT 344 11/25/2013   PLT 260 10/19/2013    Lab Results  Component Value Date   INR 1.0 10/02/2020   APTT 29 10/02/2020   Lab Results  Component Value Date   NA 138 10/02/2020   NA 140 06/01/2020   NA 136 11/25/2013   NA 135 (L) 10/19/2013   K 3.9 10/02/2020   K 3.5 06/01/2020   K 2.8 (L) 06/09/2014   K 2.9 (L) 11/25/2013   BUN 22 10/02/2020   BUN 14 06/01/2020   BUN 11 11/25/2013   BUN 9 10/19/2013    Lab Results  Component Value Date   MG 1.8 09/08/2017   MG 0.9 (L) 10/19/2013   Exam: Dressing with mild saturation around drain 5/5 strength in lower extremities Sensation intact to light touch   Imaging: None  A/P:  POD#1 L5/S1 TLIF  Plan: Remove foley, f/u void Lovenox today for DVT prophylaxis Lumbar spine films ordered F/u drain output Continue IV and PO pain regimen PT/OT today     Deetta Perla, MD

## 2020-10-13 NOTE — Evaluation (Signed)
Physical Therapy Evaluation Patient Details Name: Nicole Bass MRN: [id number] DOB: [birth year] Today's Date: 10/13/2020   History of Present Illness  70 y/o female with progrssively limiting back and L LE pain.  S/p left L5/S1 Transforaminal Interbody Fusion, Facetectomy, Laminectomy with Discectomy 10/12/20.  Clinical Impression  Pt initially in a lot of pain, rating at least 8/10, however once she was able to calm and begin doing some activity she appeared to tolerate much better.  She had some guarding and hesitancy, but was much more able to tolerate objectively as compared to her subjective reports.  Pt does not typically use a RW and did well with this, educated on appropriate donning of lumbar brace.  Pt with some pain but did mobility, ~60 ft of ambulation and showed good/functional ROM/strength in LEs.  Good first session post surgery.     Follow Up Recommendations Home health PT;Follow surgeon's recommendation for DC plan and follow-up therapies    Equipment Recommendations  None recommended by PT    Recommendations for Other Services       Precautions / Restrictions Precautions Precautions: Fall;Back Required Braces or Orthoses: Spinal Brace Spinal Brace: Lumbar corset;Applied in sitting position Restrictions Weight Bearing Restrictions: No      Mobility  Bed Mobility Overal bed mobility: Modified Independent             General bed mobility comments: cuing for proper spinal positioning and transition technique/sequencing, pt showed great effort and did not need assist    Transfers Overall transfer level: Modified independent Equipment used: Rolling walker (2 wheeled)             General transfer comment: Cuing for appropriate technique as well as encouragement, but no physical assist needed  Ambulation/Gait Ambulation/Gait assistance: Min guard Gait Distance (Feet): 60 Feet Assistive device: Rolling walker (2 wheeled)       General Gait  Details: Pt with slow, cautious but safe and effective gait.  She did not have excessive reliance on the walker and though she did have some discomfort she did not c/o increased pain or show excessive guarding  Stairs            Wheelchair Mobility    Modified Rankin (Stroke Patients Only)       Balance Overall balance assessment: Modified Independent                                           Pertinent Vitals/Pain Pain Assessment: 0-10 Pain Score: 8  Pain Location: low back, IV site, no L LE radiating pain    Home Living Family/patient expects to be discharged to:: Private residence Living Arrangements: Spouse/significant other Available Help at Discharge: Family;Available PRN/intermittently (husband at work t/o the day)   Home Access: Stairs to enter Entrance Stairs-Rails: Can reach both Technical brewer of Steps: 3 Home Layout: One level [place]: Walker - 2 wheels      Prior Function Level of Independence: Independent         Comments: Pt reports she has been limping recently, but has not needed to use AD     Hand Dominance        Extremity/Trunk Assessment   Upper Extremity Assessment Upper Extremity Assessment: Overall WFL for tasks assessed;Generalized weakness    Lower Extremity Assessment Lower Extremity Assessment: Overall WFL for tasks assessed (L LE with functional strength and ROM)  Communication   Communication: No difficulties  Cognition Arousal/Alertness: Awake/alert Behavior During Therapy: Anxious Overall Cognitive Status: Within Functional Limits for tasks assessed                                        General Comments      Exercises     Assessment/Plan    PT Assessment Patient needs continued PT services  PT Problem List Decreased strength;Decreased range of motion;Decreased activity tolerance;Decreased balance;Decreased mobility;Decreased coordination;Decreased  cognition;Decreased knowledge of use of DME;Decreased safety awareness;Pain       PT Treatment Interventions DME instruction;Gait training;Stair training;Functional mobility training;Therapeutic activities;Therapeutic exercise;Balance training;Neuromuscular re-education;Patient/family education    PT Goals (Current goals can be found in the Care Plan section)  Acute Rehab PT Goals Patient Stated Goal: go home PT Goal Formulation: With patient Time For Goal Achievement: 10/27/20 Potential to Achieve Goals: Good    Frequency 7X/week   Barriers to discharge        Co-evaluation               AM-PAC PT "6 Clicks" Mobility  Outcome Measure Help needed turning from your back to your side while in a flat bed without using bedrails?: None Help needed moving from lying on your back to sitting on the side of a flat bed without using bedrails?: None Help needed moving to and from a bed to a chair (including a wheelchair)?: None Help needed standing up from a chair using your arms (e.g., wheelchair or bedside chair)?: None Help needed to walk in hospital room?: A Little Help needed climbing 3-5 steps with a railing? : A Little 6 Click Score: 22    End of Session Equipment Utilized During Treatment: Gait belt;Back brace Activity Tolerance: Patient limited by pain;Patient tolerated treatment well Patient left: in chair;with call bell/phone within reach Nurse Communication: Mobility status PT Visit Diagnosis: Muscle weakness (generalized) (M62.81);Difficulty in walking, not elsewhere classified (R26.2);Pain Pain - Right/Left: Left Pain - part of body: Hip    Time: 4076-8088 PT Time Calculation (min) (ACUTE ONLY): 28 min   Charges:   PT Evaluation $PT Eval Low Complexity: 1 Low PT Treatments $Therapeutic Activity: 8-22 mins        Kreg Shropshire, DPT 10/13/2020, 11:36 AM

## 2020-10-14 MED ORDER — MORPHINE SULFATE (PF) 2 MG/ML IV SOLN
INTRAVENOUS | Status: AC
  Administered 2020-10-14: 2 mg via INTRAVENOUS
  Filled 2020-10-14: qty 1

## 2020-10-14 MED ORDER — GABAPENTIN 100 MG PO CAPS
ORAL_CAPSULE | ORAL | Status: AC
  Administered 2020-10-14: 100 mg via ORAL
  Filled 2020-10-14: qty 1

## 2020-10-14 MED ORDER — MORPHINE SULFATE 15 MG PO TABS
ORAL_TABLET | ORAL | Status: AC
  Administered 2020-10-14: 15 mg via ORAL
  Filled 2020-10-14: qty 1

## 2020-10-14 MED ORDER — MORPHINE SULFATE 15 MG PO TABS
ORAL_TABLET | ORAL | Status: AC
  Filled 2020-10-14: qty 1

## 2020-10-14 MED ORDER — MORPHINE SULFATE 15 MG PO TABS
15.0000 mg | ORAL_TABLET | ORAL | Status: DC | PRN
  Filled 2020-10-14 (×3): qty 1

## 2020-10-14 MED ORDER — KETOROLAC TROMETHAMINE 15 MG/ML IJ SOLN: 15.0000 mg | Freq: Four times a day (QID) | INTRAMUSCULAR | Status: DC

## 2020-10-14 MED ORDER — MELOXICAM 7.5 MG PO TABS
ORAL_TABLET | ORAL | Status: AC
  Administered 2020-10-14: 15 mg via ORAL
  Filled 2020-10-14: qty 2

## 2020-10-14 MED ORDER — TRAMADOL HCL 50 MG PO TABS
ORAL_TABLET | ORAL | Status: AC
  Administered 2020-10-14: 100 mg via ORAL
  Filled 2020-10-14: qty 2

## 2020-10-14 MED ORDER — ENOXAPARIN SODIUM 40 MG/0.4ML ~~LOC~~ SOLN
SUBCUTANEOUS | Status: AC
  Administered 2020-10-14: 40 mg via SUBCUTANEOUS
  Filled 2020-10-14: qty 0.4

## 2020-10-14 MED ORDER — MELOXICAM 7.5 MG PO TABS
15.0000 mg | ORAL_TABLET | Freq: Every day | ORAL | Status: DC
  Administered 2020-10-15: 15 mg via ORAL

## 2020-10-14 MED ORDER — GABAPENTIN 400 MG PO CAPS
ORAL_CAPSULE | ORAL | Status: AC
  Administered 2020-10-14: 400 mg via ORAL
  Filled 2020-10-14: qty 1

## 2020-10-14 NOTE — Progress Notes (Signed)
Progress Note   Date: 10/14/2020  POD#1 L5/S1 TLIF  24 Hour events: POD2: She is having lots of pain.  Otherwise is ambulatory and has no new neurologic complaints.  POD1: Nicole Bass is doing well after surgery. She endorses expected back pain but denies leg pain or numbness. Her drain put out 45 cc. She is starting to tolerate diet. Her foley catheter is in place. She is getting pain control with pain medication.     Vital Signs: Temp:  [98 F (36.7 C)-100 F (37.8 C)] 99 F (37.2 C) (03/02 0400) Pulse Rate:  [81-97] 83 (03/02 0400) Resp:  [16-18] 18 (03/02 0400) BP: (101-126)/(50-72) 126/50 (03/02 0400) SpO2:  [91 %-96 %] 92 % (03/02 0400) Temp (24hrs), Avg:99 F (37.2 C), Min:98 F (36.7 C), Max:100 F (37.8 C)  Weight: 79.4 kg   Problem List Patient Active Problem List   Diagnosis Date Noted  . Lumbar stenosis 10/12/2020  . Swelling of limb 06/02/2020  . Cervical spondylosis 05/01/2020  . Atherosclerosis of native coronary artery of native heart without angina pectoris 04/27/2020  . Numbness and tingling in left hand 10/29/2019  . 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 Status post right partial knee replacement 02/12/2019  . Rotator cuff arthropathy, right 01/25/2019  . Iron deficiency anemia 09/13/2018  . Anemia, unspecified 09/09/2018  . Iron deficiency 09/04/2018  . Acute kidney injury ([place]) 08/23/2018  . HTN, goal below 140/80 06/04/2018  . Hyperthyroidism 02/08/2018  . Imbalance 12/18/2017  . Stomatitis, ulcerative 09/06/2017  . Dyspareunia in female 08/22/2017  . Vaginal atrophy 08/22/2017  . Obesity (BMI 30.0-34.9) 08/22/2017  . Varicose veins of leg with pain, left 04/05/2017  . Peripheral polyneuropathy 04/05/2017  . Lumbar radiculopathy 04/05/2017  . Tremor 02/13/2017  . Sensory neuropathy 02/13/2017  . Restless leg syndrome 02/13/2017  . Impingement syndrome of left shoulder 09/29/2016  . Acute diarrhea  09/06/2016  . History of pyelonephritis 09/06/2016  . Hypokalemia 09/06/2016  . 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 Pyelonephritis 08/29/2016  . Rheumatoid arthritis, unspecified ([place]) 08/29/2016  . Nontraumatic complete tear of right rotator cuff 03/08/2016  . Acute pain of right shoulder 01/27/2016  . Type 2 diabetes mellitus with diabetic neuropathy ([place]) 10/27/2015  . Closed fracture of distal end of right radius with routine healing 09/17/2015  . Crohn's colitis ([place]) 09/04/2015  . Acute bilateral low back pain without sciatica 03/23/2015  . SOB (shortness of breath) on exertion 01/15/2015  . Pedal edema 01/15/2015  . Status post open reduction with internal fixation of fracture 11/27/2014  . Aftercare for healing traumatic fracture of lower arm 09/25/2014  . Painful rib 09/11/2014  . Primary osteoarthritis of left knee 04/24/2014  . Osteoporosis 03/17/2014  . Depression 03/17/2014  . B12 deficiency 03/17/2014  . Crohn's disease, unspecified, without complications ([place]) 47/42/5956  . Urolith 07/16/2013  . Right flank pain 07/16/2013  . Abdominal pain 07/16/2013    Medications: Scheduled Meds: . acidophilus  1 capsule Oral Daily  . baclofen  5 mg Oral Q6H  . cholecalciferol  1,000 Units Oral Daily  . enoxaparin      . enoxaparin (LOVENOX) injection  40 mg Subcutaneous Q24H  . fluconazole  200 mg Oral Daily  . furosemide  20 mg Oral Daily  . gabapentin  100 mg Oral BH-q7a  . gabapentin  400 mg Oral QHS  . irbesartan  75 mg Oral Daily  . loratadine  10 mg Oral Daily  .  multivitamin with minerals  1 tablet Oral Daily  . pantoprazole  40 mg Oral Daily  . potassium chloride SA  20 mEq Oral Q M,W,F  . predniSONE  3 mg Oral Q breakfast  . senna  1 tablet Oral BID  . sodium chloride flush  3 mL Intravenous Q12H  . traMADol       Continuous Infusions: . sodium chloride     PRN Meds:.acetaminophen **OR** acetaminophen, diphenoxylate-atropine, lidocaine,  menthol-cetylpyridinium **OR** phenol, morphine injection, ondansetron **OR** ondansetron (ZOFRAN) IV, rOPINIRole, sodium chloride flush, sucralfate, traMADol  Labs:  Lab Results  Component Value Date   WBC 11.7 (H) 10/12/2020   WBC 7.5 07/24/2020   HCT 26.0 (L) 10/12/2020   HCT 36.7 07/24/2020   HCT 34.1 (L) 11/25/2013   HCT 32.8 (L) 10/19/2013   PLT 336 10/12/2020   PLT 249 07/24/2020   PLT 344 11/25/2013   PLT 260 10/19/2013    Lab Results  Component Value Date   INR 1.0 10/02/2020   APTT 29 10/02/2020    Lab Results  Component Value Date   NA 138 10/02/2020   NA 140 06/01/2020   NA 136 11/25/2013   NA 135 (L) 10/19/2013   K 3.9 10/02/2020   K 3.5 06/01/2020   K 2.8 (L) 06/09/2014   K 2.9 (L) 11/25/2013   BUN 22 10/02/2020   BUN 14 06/01/2020   BUN 11 11/25/2013   BUN 9 10/19/2013    Lab Results  Component Value Date   MG 1.8 09/08/2017   MG 0.9 (L) 10/19/2013   Exam: Dressing with mild saturation around drain 5/5 strength in lower extremities Sensation intact to light touch   Imaging: No issue noted - good position of TLIF cage  A/P:  POD#2 L5/S1 TLIF  Plan: PT/OT today Will adjust pain medications.    Meade Maw, MD

## 2020-10-14 NOTE — Progress Notes (Signed)
Occupational Therapy Treatment Patient Details Name: Nicole Bass MRN: [id number] DOB: 1951-02-10 Today's Date: 10/14/2020    History of present illness 70 y/o female with progrssively limiting back and L LE pain.  S/p left L5/S1 Transforaminal Interbody Fusion, Facetectomy, Laminectomy with Discectomy 10/12/20.   OT comments  Upon entering the room, pt supine in bed in side lying position with brace donned and reporting 8/10 back pain. Pt pointing in the direction of notepad with several written questions from pt's spouse. OT reading questions and attempting to clarify concerns with pt. Pt will not have 24/7 supervision/assist at discharge as her husband works during the day. For pain management, OT leading pt in log roll to EOB with min A and then providing pt with cues to doff back brace for comfort. Pt returning back to bed and reports she is "in a lot of pain right now and I can't". Pt unable to verbalize any back precautions. OT reviewed back precautions with self care tasks with pt needing min - mod cuing to correctly answer questions with demonstrations for proper technique in order to maintain precautions. Pt very tearful at end of session and reports, " I can't go home like this". Pt then requesting short term rehab before returning home therefore recommendation changing from [facility] to SNF this session. All needs within reach. RN notified.    Follow Up Recommendations  SNF    Equipment Recommendations  3 in 1 bedside commode       Precautions / Restrictions Precautions Precautions: Fall;Back Precaution Booklet Issued: Yes (comment) Required Braces or Orthoses: Spinal Brace Spinal Brace: Lumbar corset;Applied in sitting position Restrictions Weight Bearing Restrictions: No       Mobility Bed Mobility Overal bed mobility: Needs Assistance             General bed mobility comments: cues for log roll with good carryover    Transfers Overall transfer level: Needs  assistance Equipment used: Rolling walker (2 wheeled) Transfers: Sit to/from Stand Sit to Stand: Min guard         General transfer comment: cues for hand placement    Balance Overall balance assessment: Needs assistance Sitting-balance support: Feet supported Sitting balance-Leahy Scale: Good                                     ADL either performed or assessed with clinical judgement        Vision Patient Visual Report: No change from baseline            Cognition Arousal/Alertness: Awake/alert Behavior During Therapy: WFL for tasks assessed/performed Overall Cognitive Status: Within Functional Limits for tasks assessed                                 General Comments: increased confusion and unable to verbal precautions                   Pertinent Vitals/ Pain       Pain Assessment: 0-10 Pain Score: 8  Pain Location: low back Pain Descriptors / Indicators: Aching;Guarding Pain Intervention(s): Limited activity within patient's tolerance;Monitored during session;Premedicated before session;Repositioned         Frequency  Min 2X/week        Progress Toward Goals  OT Goals(current goals can now be found in the care plan section)  Progress  towards OT goals: Progressing toward goals  Acute Rehab OT Goals Patient Stated Goal: I can't go home OT Goal Formulation: With patient Time For Goal Achievement: 10/27/20 Potential to Achieve Goals: Good  Plan Discharge plan needs to be updated       AM-PAC OT "6 Clicks" Daily Activity     Outcome Measure   Help from another person eating meals?: None   Help from another person toileting, which includes using toliet, bedpan, or urinal?: A Lot Help from another person bathing (including washing, rinsing, drying)?: A Lot Help from another person to put on and taking off regular upper body clothing?: A Little Help from another person to put on and taking off regular lower body  clothing?: A Lot 6 Click Score: 13    End of Session Equipment Utilized During Treatment: Back brace  OT Visit Diagnosis: Other abnormalities of gait and mobility (R26.89);Muscle weakness (generalized) (M62.81);Pain Pain - Right/Left:  (back)   Activity Tolerance Patient limited by pain   Patient Left in bed;with call bell/phone within reach;with bed alarm set   Nurse Communication Mobility status;Other (comment) (pt requesting SNF)        Time: 3235-5732 OT Time Calculation (min): 25 min  Charges: OT General Charges $OT Visit: 1 Visit OT Treatments $Self Care/Home Management : 23-37 mins  Darleen Crocker, MS, OTR/L , CBIS ascom [phone]  10/14/20, 3:36 PM

## 2020-10-14 NOTE — Progress Notes (Signed)
Physical Therapy Treatment Patient Details Name: Nicole Bass MRN: [id number] DOB: Jul 08, 1951 Today's Date: 10/14/2020    History of Present Illness 70 y/o female with progrssively limiting back and L LE pain.  S/p left L5/S1 Transforaminal Interbody Fusion, Facetectomy, Laminectomy with Discectomy 10/12/20.    PT Comments    Pt continues to c/o significant pain at rest and with activity.  She was willing to participate but could not tolerate prolonged bouts of activity.  She did manage to slowly but guardedly do >100 ft of ambulation but needed rest  break after the effort (pain and fatigue both being a factor).  She did also manage to negotiate up/down steps but again very guarded, slow and needed consistent reassurance/encouragement.  Pt clearly not feeling well and on return to the room laid back down, eyes closed, groaning in pain.   Follow Up Recommendations  Home health PT;Follow surgeon's recommendation for DC plan and follow-up therapies     Equipment Recommendations  None recommended by PT    Recommendations for Other Services       Precautions / Restrictions Precautions Precautions: Fall;Back Precaution Booklet Issued: Yes (comment) Required Braces or Orthoses: Spinal Brace Spinal Brace: Lumbar corset;Applied in sitting position Restrictions Weight Bearing Restrictions: No    Mobility  Bed Mobility Overal bed mobility: Needs Assistance             General bed mobility comments: cues for log roll with good carryover    Transfers Overall transfer level: Needs assistance Equipment used: Rolling walker (2 wheeled) Transfers: Sit to/from Stand Sit to Stand: Min guard         General transfer comment: cues for hand placement  Ambulation/Gait Ambulation/Gait assistance: Min guard Gait Distance (Feet): 110 Feet Assistive device: Rolling walker (2 wheeled)       General Gait Details: Pt with slow, cautious but safe and effective gait.  She did not  have excessive reliance on the walker and though she did have some discomfort she did not c/o increased pain or show excessive guarding   Stairs Stairs: Yes Stairs assistance: Min guard Stair Management: Two rails;Forwards Number of Stairs: 4 General stair comments: Pt still having a lot of pain and is guarded with stair negotiation, but was able to slowly and effectively negotiate up/down steps with heavy UE use on rails and repeated cuing for appropriate strategy.   Wheelchair Mobility    Modified Rankin (Stroke Patients Only)       Balance Overall balance assessment: Needs assistance Sitting-balance support: Feet supported Sitting balance-Leahy Scale: Good                                      Cognition Arousal/Alertness: Awake/alert Behavior During Therapy: WFL for tasks assessed/performed Overall Cognitive Status: Within Functional Limits for tasks assessed                                 General Comments: increased confusion and unable to verbal precautions      Exercises      General Comments        Pertinent Vitals/Pain Pain Assessment: 0-10 Pain Score: 8  Pain Location: low back Pain Descriptors / Indicators: Aching;Guarding Pain Intervention(s): Limited activity within patient's tolerance;Monitored during session;Premedicated before session;Repositioned    Home Living  Prior Function            PT Goals (current goals can now be found in the care plan section) Progress towards PT goals: Progressing toward goals    Frequency    7X/week      PT Plan Current plan remains appropriate    Co-evaluation              AM-PAC PT "6 Clicks" Mobility   Outcome Measure  Help needed turning from your back to your side while in a flat bed without using bedrails?: None Help needed moving from lying on your back to sitting on the side of a flat bed without using bedrails?: None Help  needed moving to and from a bed to a chair (including a wheelchair)?: None Help needed standing up from a chair using your arms (e.g., wheelchair or bedside chair)?: None Help needed to walk in hospital room?: A Little Help needed climbing 3-5 steps with a railing? : A Little 6 Click Score: 22    End of Session Equipment Utilized During Treatment: Gait belt;Back brace Activity Tolerance: Patient limited by pain;Patient limited by fatigue Patient left: with bed alarm set;with call bell/phone within reach Nurse Communication: Mobility status PT Visit Diagnosis: Muscle weakness (generalized) (M62.81);Difficulty in walking, not elsewhere classified (R26.2);Pain Pain - Right/Left: Left Pain - part of body: Hip     Time: 8891-6945 PT Time Calculation (min) (ACUTE ONLY): 27 min  Charges:  $Gait Training: 8-22 mins $Therapeutic Activity: 8-22 mins                     Kreg Shropshire, DPT 10/14/2020, 3:35 PM

## 2020-10-15 MED ORDER — ENOXAPARIN SODIUM 40 MG/0.4ML ~~LOC~~ SOLN
SUBCUTANEOUS | Status: AC
  Administered 2020-10-15: 40 mg via SUBCUTANEOUS
  Filled 2020-10-15: qty 0.4

## 2020-10-15 MED ORDER — GABAPENTIN 100 MG PO CAPS
ORAL_CAPSULE | ORAL | Status: AC
  Administered 2020-10-15: 100 mg via ORAL
  Filled 2020-10-15: qty 1

## 2020-10-15 MED ORDER — MORPHINE SULFATE 15 MG PO TABS: 15.0000 mg | ORAL_TABLET | ORAL | 0 refills | Status: DC | PRN

## 2020-10-15 MED ORDER — MELOXICAM 7.5 MG PO TABS
ORAL_TABLET | ORAL | Status: AC
  Filled 2020-10-15: qty 2

## 2020-10-15 MED ORDER — MORPHINE SULFATE 15 MG PO TABS
ORAL_TABLET | ORAL | Status: AC
  Administered 2020-10-15: 15 mg via ORAL
  Filled 2020-10-15: qty 1

## 2020-10-15 NOTE — Discharge Instructions (Signed)
Your surgeon has performed an operation on your lumbar spine (low back) to relieve pressure on one or more nerves. Many times, patients feel better immediately after surgery and can overdo it. Even if you feel well, it is important that you follow these activity guidelines. If you do not let your back heal properly from the surgery, you can increase the chance of a disc herniation and/or return of your symptoms. The following are instructions to help in your recovery once you have been discharged from the hospital.  * Do not take anti-inflammatory medications for 3 months after surgery (naproxen [Aleve], ibuprofen [Advil, Motrin], celecoxib [Celebrex], etc.)  * OK to restart aspirin 7 days after surgery.  Activity    No bending, lifting, or twisting (BLT). Avoid lifting objects heavier than 10 pounds (gallon milk jug).  Where possible, avoid household activities that involve lifting, bending, pushing, or pulling such as laundry, vacuuming, grocery shopping, and childcare. Try to arrange for help from friends and family for these activities while your back heals.  Increase physical activity slowly as tolerated.  Taking short walks is encouraged, but avoid strenuous exercise. Do not jog, run, bicycle, lift weights, or participate in any other exercises unless specifically allowed by your doctor. Avoid prolonged sitting, including car rides.  Talk to your doctor before resuming sexual activity.  You should not drive until cleared by your doctor.  Until released by your doctor, you should not return to work or school.  You should rest at home and let your body heal.   You may shower two days after your surgery.  After showering, lightly dab your incision dry. Do not take a tub bath or go swimming for 3 weeks, or until approved by your doctor at your follow-up appointment.  If you smoke, we strongly recommend that you quit.  Smoking has been proven to interfere with normal healing in your back  and will dramatically reduce the success rate of your surgery. Please contact [facility] (800-QUIT-NOW) and use the resources at www.[facility].com for assistance in stopping smoking.  Surgical Incision   If you have a dressing on your incision, you may remove it three days after your surgery. Keep your incision area clean and dry.  If you have staples or stitches on your incision, you should have a follow up scheduled for removal. If you do not have staples or stitches, you will have steri-strips (small pieces of surgical tape) or Dermabond glue. The steri-strips/glue should begin to peel away within about a week (it is fine if the steri-strips fall off before then). If the strips are still in place one week after your surgery, you may gently remove them.  Diet            You may return to your usual diet. Be sure to stay hydrated.  When to Contact us  Although your surgery and recovery will likely be uneventful, you may have some residual numbness, aches, and pains in your back and/or legs. This is normal and should improve in the next few weeks.  However, should you experience any of the following, contact us immediately:  New numbness or weakness  Pain that is progressively getting worse, and is not relieved by your pain medications or rest  Bleeding, redness, swelling, pain, or drainage from surgical incision  Chills or flu-like symptoms  Fever greater than 101.0 F (38.3 C)  Problems with bowel or bladder functions  Difficulty breathing or shortness of breath  Warmth, tenderness, or swelling in  your calf  Contact Information  During office hours (Monday-Friday 9 am to 5 pm), please call your physician at [phone]  After hours and weekends, please call [phone] and speak with the answering service, who will contact the doctor on call.  If that fails, call the [place] Operator at [phone] and ask for the Neurosurgery Resident On Call   For a life-threatening  emergency, call 911

## 2020-10-15 NOTE — Progress Notes (Signed)
Progress Note   Date: 10/15/2020  POD#3 L5/S1 TLIF  24 Hour events: POD3: She is much improved.  She mobilized well yesterday  POD2: She is having lots of pain.  Otherwise is ambulatory and has no new neurologic complaints.  POD1: Nicole Bass is doing well after surgery. She endorses expected back pain but denies leg pain or numbness. Her drain put out 45 cc. She is starting to tolerate diet. Her foley catheter is in place. She is getting pain control with pain medication.     Vital Signs: Temp:  [98.5 F (36.9 C)-99.4 F (37.4 C)] 99.4 F (37.4 C) (03/03 0400) Pulse Rate:  [75-100] 75 (03/03 0400) Resp:  [16-18] 16 (03/03 0400) BP: (109-133)/(44-64) 109/45 (03/03 0400) SpO2:  [93 %-99 %] 93 % (03/03 0400) Temp (24hrs), Avg:99.1 F (37.3 C), Min:98.5 F (36.9 C), Max:99.4 F (37.4 C)  Weight: 79.4 kg   Problem List Patient Active Problem List   Diagnosis Date Noted  . Lumbar stenosis 10/12/2020  . Swelling of limb 06/02/2020  . Cervical spondylosis 05/01/2020  . Atherosclerosis of native coronary artery of native heart without angina pectoris 04/27/2020  . Numbness and tingling in left hand 10/29/2019  . 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 Status post right partial knee replacement 02/12/2019  . Rotator cuff arthropathy, right 01/25/2019  . Iron deficiency anemia 09/13/2018  . Anemia, unspecified 09/09/2018  . Iron deficiency 09/04/2018  . Acute kidney injury ([place]) 08/23/2018  . HTN, goal below 140/80 06/04/2018  . Hyperthyroidism 02/08/2018  . Imbalance 12/18/2017  . Stomatitis, ulcerative 09/06/2017  . Dyspareunia in female 08/22/2017  . Vaginal atrophy 08/22/2017  . Obesity (BMI 30.0-34.9) 08/22/2017  . Varicose veins of leg with pain, left 04/05/2017  . Peripheral polyneuropathy 04/05/2017  . Lumbar radiculopathy 04/05/2017  . Tremor 02/13/2017  . Sensory neuropathy 02/13/2017  . Restless leg syndrome 02/13/2017  .  Impingement syndrome of left shoulder 09/29/2016  . Acute diarrhea 09/06/2016  . History of pyelonephritis 09/06/2016  . Hypokalemia 09/06/2016  . 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 Pyelonephritis 08/29/2016  . Rheumatoid arthritis, unspecified ([place]) 08/29/2016  . Nontraumatic complete tear of right rotator cuff 03/08/2016  . Acute pain of right shoulder 01/27/2016  . Type 2 diabetes mellitus with diabetic neuropathy ([place]) 10/27/2015  . Closed fracture of distal end of right radius with routine healing 09/17/2015  . Crohn's colitis ([place]) 09/04/2015  . Acute bilateral low back pain without sciatica 03/23/2015  . SOB (shortness of breath) on exertion 01/15/2015  . Pedal edema 01/15/2015  . Status post open reduction with internal fixation of fracture 11/27/2014  . Aftercare for healing traumatic fracture of lower arm 09/25/2014  . Painful rib 09/11/2014  . Primary osteoarthritis of left knee 04/24/2014  . Osteoporosis 03/17/2014  . Depression 03/17/2014  . B12 deficiency 03/17/2014  . Crohn's disease, unspecified, without complications ([place]) 17/79/3903  . Urolith 07/16/2013  . Right flank pain 07/16/2013  . Abdominal pain 07/16/2013    Medications: Scheduled Meds: . acidophilus  1 capsule Oral Daily  . baclofen  5 mg Oral Q6H  . cholecalciferol  1,000 Units Oral Daily  . enoxaparin (LOVENOX) injection  40 mg Subcutaneous Q24H  . fluconazole  200 mg Oral Daily  . furosemide  20 mg Oral Daily  . gabapentin  100 mg Oral BH-q7a  . gabapentin  400 mg Oral QHS  . irbesartan  75 mg Oral Daily  . loratadine  10  mg Oral Daily  . meloxicam  15 mg Oral Daily  . multivitamin with minerals  1 tablet Oral Daily  . pantoprazole  40 mg Oral Daily  . potassium chloride SA  20 mEq Oral Q M,W,F  . predniSONE  3 mg Oral Q breakfast  . senna  1 tablet Oral BID  . sodium chloride flush  3 mL Intravenous Q12H   Continuous Infusions: . sodium chloride     PRN  Meds:.acetaminophen **OR** acetaminophen, diphenoxylate-atropine, lidocaine, menthol-cetylpyridinium **OR** phenol, morphine, ondansetron **OR** ondansetron (ZOFRAN) IV, rOPINIRole, sodium chloride flush, sucralfate  Labs:  Lab Results  Component Value Date   WBC 11.7 (H) 10/12/2020   WBC 7.5 07/24/2020   HCT 26.0 (L) 10/12/2020   HCT 36.7 07/24/2020   HCT 34.1 (L) 11/25/2013   HCT 32.8 (L) 10/19/2013   PLT 336 10/12/2020   PLT 249 07/24/2020   PLT 344 11/25/2013   PLT 260 10/19/2013    Lab Results  Component Value Date   INR 1.0 10/02/2020   APTT 29 10/02/2020    Lab Results  Component Value Date   NA 138 10/02/2020   NA 140 06/01/2020   NA 136 11/25/2013   NA 135 (L) 10/19/2013   K 3.9 10/02/2020   K 3.5 06/01/2020   K 2.8 (L) 06/09/2014   K 2.9 (L) 11/25/2013   BUN 22 10/02/2020   BUN 14 06/01/2020   BUN 11 11/25/2013   BUN 9 10/19/2013    Lab Results  Component Value Date   MG 1.8 09/08/2017   MG 0.9 (L) 10/19/2013   Exam: Dressing with mild saturation around drain 5/5 strength in lower extremities Sensation intact to light touch   Imaging: No issue noted - good position of TLIF cage  A/P:  POD#3 L5/S1 TLIF  Plan: dispo planning Pain control   Meade Maw, MD

## 2020-10-15 NOTE — Discharge Summary (Signed)
Physician Discharge Summary  Patient ID: MICAILA ZIEMBA MRN: [id number] DOB/AGE: February 02, 1951 70 y.o.  Admit date: 10/12/2020 Discharge date: 10/15/2020  Admission Diagnoses: lumbar stenosis  Discharge Diagnoses:  Active Problems:   Lumbar stenosis   Discharged Condition: good  Hospital Course:  Ms. Clewis was admitted for elective spine surgery.  She had an expected post-operative course.  We adjusted her pain medications.  She was stable for discharge on POD3.  Consults: None  Significant Diagnostic Studies: labs: noted and radiology: X-Ray: good placement of implants  Treatments: surgery: L5-S1 TLIF  Discharge Exam: Blood pressure (!) 109/45, pulse 75, temperature 99.4 F (37.4 C), temperature source Temporal, resp. rate 16, height 5' 2"  (1.575 m), weight 79.4 kg, SpO2 93 %. General appearance: alert and cooperative  AAOx3 CNI 5/5 t/o  Incision c/d/i  Disposition: Discharge disposition: 01-Home or Self Care       Discharge Instructions    Discharge patient   Complete by: As directed    Discharge disposition: 01-Home or Self Care   Discharge patient date: 10/15/2020   Incentive spirometry RT   Complete by: As directed      Allergies as of 10/15/2020      Reactions   Ciprofloxacin Itching, Rash   Levofloxacin Hives, Rash   Methotrexate    Other reaction(s): Other (See Comments) Mouth Ulcers/[place] Mouth and throat ulcers   Methotrexate Derivatives    Mouth and throat ulcers   Other Other (See Comments), Rash   Other reaction(s): Other (See Comments), Unknown Per pts MD she is unable to take this medication because it is contraindicated with her other medications. Per pts MD she is unable to take this medication because it is contraindicated with her other medications.  Other reaction(s): Other (See Comments), Unknown Other reaction(s): Other (See Comments), Unknown Per pts MD she is unable to take this medication because it is contraindicated with her  other medications. Per pts MD she is unable to take this medication because it is contraindicated with her other medications. Other reaction(s): Other (See Comments), Unknown Other reaction(s): Other (See Comments), Unknown Per pts MD she is unable to take this medication because it is contraindicated with her  Per pts MD she is unable to take this medication because it is contraindicated with her other medications. Other reaction(s): Other (See Comments) Other reaction(s): Other (See Comments), Unknown Per pts MD she is unable to take this medication because it is contraindicated with her other medications. Per pts MD she is unable to take this medication because it is contraindicated with her other medications.  Other reaction(s): Other (See Comments), Unknown Other reaction(s): Other (See Comments), Unknown Per pts MD she is unable to take this medication because it is contraindicated with her other medications. Per pts MD she is unable to take this medication because it is contraindicated with her other medications. Other reaction(s): Other (See Comments), Unknown Other reaction(s): Other (See Comments), Unknown Per pts MD she is unable to take this medication because it is contraindicated with her  Per pts MD she is unable to take this medication because it is contraindicated with her other medications.   Sulfa Antibiotics Other (See Comments)   Per pts MD she is unable to take this medication because it is contraindicated with her other medications.   Trelegy Ellipta [fluticasone-umeclidin-vilant]    Laryngitis    Cefazolin Itching, Rash   Cefprozil Rash   Cephalosporins Itching, Rash   Enbrel [etanercept] Itching, Rash   Humira [adalimumab] Itching, Rash  Hydrocodone-acetaminophen Itching, Other (See Comments)   Itching only   Lorabid [loracarbef] Itching, Rash   Meropenem Itching, Rash   Oxycodone Itching, Other (See Comments), Rash   Other reaction(s): Other  (See Comments)   Oxycodone-acetaminophen Itching   Primidone Itching, Rash      Medication List    STOP taking these medications   traMADol 50 MG tablet Commonly known as: ULTRAM     TAKE these medications   acetaminophen 650 MG CR tablet Commonly known as: TYLENOL Take 1,300 mg by mouth every 8 (eight) hours as needed for pain.   alendronate 70 MG tablet Commonly known as: FOSAMAX Take 70 mg by mouth every Monday. Take with a full glass of water on an empty stomach.   aspirin EC 81 MG tablet Take 81 mg by mouth daily. Swallow whole.   Baclofen 5 MG Tabs Take 5 mg by mouth every 6 (six) hours.   betamethasone dipropionate 0.05 % lotion Apply topically 2 (two) times daily as needed (Rash). Apply thin coat to affected areas twice daily for 2 weeks   cetirizine 10 MG tablet Commonly known as: ZYRTEC Take 10 mg by mouth daily.   chlorhexidine 0.12 % solution Commonly known as: PERIDEX Use as directed 15 mLs in the mouth or throat as needed.   cholecalciferol 25 MCG (1000 UNIT) tablet Commonly known as: VITAMIN D3 Take 1,000 Units by mouth daily.   cyanocobalamin 1000 MCG/ML injection Commonly known as: (VITAMIN B-12) Inject 1,000 mcg into the muscle every 30 (thirty) days.   diazepam 10 MG tablet Commonly known as: Valium Take 1 tablet (10 mg total) by mouth daily as needed (vaginal pain with intercourse, use before sexual activity per vagina). What changed: when to take this   diphenoxylate-atropine 2.5-0.025 MG tablet Commonly known as: LOMOTIL Take 1 tablet by mouth daily. am   fluconazole 200 MG tablet Commonly known as: DIFLUCAN Take 400 mg by mouth daily.   furosemide 20 MG tablet Commonly known as: LASIX Take 20 mg by mouth daily. am   gabapentin 400 MG capsule Commonly known as: NEURONTIN Take 400 mg by mouth at bedtime.   gabapentin 100 MG capsule Commonly known as: NEURONTIN Take 100 mg by mouth every morning.   lidocaine 2 %  solution Commonly known as: XYLOCAINE Use as directed 15 mLs in the mouth or throat as needed for mouth pain.   magnesium oxide 400 MG tablet Commonly known as: MAG-OX Take 400 mg by mouth every Monday, Wednesday, and Friday. 3 times per week   morphine 15 MG tablet Commonly known as: MSIR Take 1 tablet (15 mg total) by mouth every 3 (three) hours as needed for up to 7 days for severe pain or moderate pain.   multivitamin with minerals Tabs tablet Take 1 tablet by mouth daily.   omeprazole 40 MG capsule Commonly known as: PRILOSEC Take 40 mg by mouth daily before breakfast.   potassium chloride SA 20 MEQ tablet Commonly known as: KLOR-CON Take 20 mEq by mouth every Monday, Wednesday, and Friday.   predniSONE 1 MG tablet Commonly known as: DELTASONE Take 3 mg by mouth daily with breakfast.   PROBIOTIC PO Take 1 capsule by mouth daily.   rOPINIRole 1 MG tablet Commonly known as: REQUIP Take 1 mg by mouth at bedtime as needed (for restless legs).   sucralfate 1 g tablet Commonly known as: CARAFATE Take 0.5 g by mouth 3 (three) times daily with meals as needed (for stomach cramping (crohn's  disease)).   telmisartan 20 MG tablet Commonly known as: MICARDIS Take 20 mg by mouth every Monday, Wednesday, and Friday.       Follow-up Information    Deetta Perla, MD Follow up in 2 week(s).   Specialty: Neurosurgery Why: already scheduled Contact information: [place] [address] [phone]               Signed: Meade Maw 10/15/2020, 7:44 AM

## 2020-10-17 ENCOUNTER — Inpatient Hospital Stay
Admission: EM | Admit: 2020-10-17 | Discharge: 2020-10-20 | DRG: 552 | Disposition: A | Discharge status: Home Health | Payer: Medicare Other | Attending: Neurosurgery | Admitting: Neurosurgery

## 2020-10-17 ENCOUNTER — Other Ambulatory Visit: Payer: Self-pay

## 2020-10-17 ENCOUNTER — Encounter: Payer: Self-pay | Admitting: Emergency Medicine

## 2020-10-17 ENCOUNTER — Emergency Department: Payer: Medicare Other

## 2020-10-17 DIAGNOSIS — H8109 Meniere's disease, unspecified ear: Secondary | ICD-10-CM | POA: Diagnosis present

## 2020-10-17 DIAGNOSIS — K5090 Crohn's disease, unspecified, without complications: Secondary | ICD-10-CM | POA: Diagnosis present

## 2020-10-17 DIAGNOSIS — F40240 Claustrophobia: Secondary | ICD-10-CM | POA: Diagnosis present

## 2020-10-17 DIAGNOSIS — M4326 Fusion of spine, lumbar region: Principal | ICD-10-CM | POA: Diagnosis present

## 2020-10-17 DIAGNOSIS — H9192 Unspecified hearing loss, left ear: Secondary | ICD-10-CM | POA: Diagnosis present

## 2020-10-17 DIAGNOSIS — Z882 Allergy status to sulfonamides status: Secondary | ICD-10-CM | POA: Diagnosis not present

## 2020-10-17 DIAGNOSIS — M810 Age-related osteoporosis without current pathological fracture: Secondary | ICD-10-CM | POA: Diagnosis present

## 2020-10-17 DIAGNOSIS — Z87442 Personal history of urinary calculi: Secondary | ICD-10-CM | POA: Diagnosis not present

## 2020-10-17 DIAGNOSIS — Z86010 Personal history of colonic polyps: Secondary | ICD-10-CM | POA: Diagnosis not present

## 2020-10-17 DIAGNOSIS — Z888 Allergy status to other drugs, medicaments and biological substances status: Secondary | ICD-10-CM | POA: Diagnosis not present

## 2020-10-17 DIAGNOSIS — M5030 Other cervical disc degeneration, unspecified cervical region: Secondary | ICD-10-CM | POA: Diagnosis present

## 2020-10-17 DIAGNOSIS — Z90710 Acquired absence of both cervix and uterus: Secondary | ICD-10-CM | POA: Diagnosis not present

## 2020-10-17 DIAGNOSIS — Z9049 Acquired absence of other specified parts of digestive tract: Secondary | ICD-10-CM

## 2020-10-17 DIAGNOSIS — Z881 Allergy status to other antibiotic agents status: Secondary | ICD-10-CM | POA: Diagnosis not present

## 2020-10-17 DIAGNOSIS — Z885 Allergy status to narcotic agent status: Secondary | ICD-10-CM | POA: Diagnosis not present

## 2020-10-17 DIAGNOSIS — Z981 Arthrodesis status: Secondary | ICD-10-CM

## 2020-10-17 DIAGNOSIS — F419 Anxiety disorder, unspecified: Secondary | ICD-10-CM | POA: Diagnosis present

## 2020-10-17 DIAGNOSIS — I10 Essential (primary) hypertension: Secondary | ICD-10-CM | POA: Diagnosis present

## 2020-10-17 DIAGNOSIS — G629 Polyneuropathy, unspecified: Secondary | ICD-10-CM | POA: Diagnosis present

## 2020-10-17 DIAGNOSIS — K219 Gastro-esophageal reflux disease without esophagitis: Secondary | ICD-10-CM | POA: Diagnosis present

## 2020-10-17 DIAGNOSIS — Z20822 Contact with and (suspected) exposure to covid-19: Secondary | ICD-10-CM | POA: Diagnosis present

## 2020-10-17 DIAGNOSIS — G8918 Other acute postprocedural pain: Secondary | ICD-10-CM | POA: Diagnosis present

## 2020-10-17 DIAGNOSIS — G2581 Restless legs syndrome: Secondary | ICD-10-CM | POA: Diagnosis present

## 2020-10-17 DIAGNOSIS — M069 Rheumatoid arthritis, unspecified: Secondary | ICD-10-CM | POA: Diagnosis present

## 2020-10-17 DIAGNOSIS — Z96611 Presence of right artificial shoulder joint: Secondary | ICD-10-CM | POA: Diagnosis present

## 2020-10-17 LAB — CBC
HCT: 27.3 % — ABNORMAL LOW (ref 36.0–46.0)
Hemoglobin: 8.4 g/dL — ABNORMAL LOW (ref 12.0–15.0)
MCH: 30.5 pg (ref 26.0–34.0)
MCHC: 30.8 g/dL (ref 30.0–36.0)
MCV: 99.3 fL (ref 80.0–100.0)
Platelets: 461 10*3/uL — ABNORMAL HIGH (ref 150–400)
RBC: 2.75 MIL/uL — ABNORMAL LOW (ref 3.87–5.11)
RDW: 13.9 % (ref 11.5–15.5)
WBC: 8.9 10*3/uL (ref 4.0–10.5)
nRBC: 0 % (ref 0.0–0.2)

## 2020-10-17 LAB — COMPREHENSIVE METABOLIC PANEL
ALT: 24 U/L (ref 0–44)
AST: 37 U/L (ref 15–41)
Albumin: 2.6 g/dL — ABNORMAL LOW (ref 3.5–5.0)
Alkaline Phosphatase: 80 U/L (ref 38–126)
Anion gap: 14 (ref 5–15)
BUN: 12 mg/dL (ref 8–23)
CO2: 24 mmol/L (ref 22–32)
Calcium: 8.4 mg/dL — ABNORMAL LOW (ref 8.9–10.3)
Chloride: 100 mmol/L (ref 98–111)
Creatinine, Ser: 0.99 mg/dL (ref 0.44–1.00)
GFR, Estimated: >60 mL/min (ref >60)
Glucose, Bld: 128 mg/dL — ABNORMAL HIGH (ref 70–99)
Potassium: 3.2 mmol/L — ABNORMAL LOW (ref 3.5–5.1)
Sodium: 138 mmol/L (ref 135–145)
Total Bilirubin: 1.1 mg/dL (ref 0.3–1.2)
Total Protein: 6.5 g/dL (ref 6.5–8.1)

## 2020-10-17 LAB — CBC WITH DIFFERENTIAL/PLATELET
Abs Immature Granulocytes: 0.03 10*3/uL (ref 0.00–0.07)
Basophils Absolute: 0.1 10*3/uL (ref 0.0–0.1)
Basophils Relative: 1 %
Eosinophils Absolute: 0.1 10*3/uL (ref 0.0–0.5)
Eosinophils Relative: 1 %
HCT: 25.6 % — ABNORMAL LOW (ref 36.0–46.0)
Hemoglobin: 8.3 g/dL — ABNORMAL LOW (ref 12.0–15.0)
Immature Granulocytes: 0 %
Lymphocytes Relative: 10 %
Lymphs Abs: 1 10*3/uL (ref 0.7–4.0)
MCH: 31.6 pg (ref 26.0–34.0)
MCHC: 32.4 g/dL (ref 30.0–36.0)
MCV: 97.3 fL (ref 80.0–100.0)
Monocytes Absolute: 0.9 10*3/uL (ref 0.1–1.0)
Monocytes Relative: 8 %
Neutro Abs: 8.1 10*3/uL — ABNORMAL HIGH (ref 1.7–7.7)
Neutrophils Relative %: 80 %
Platelets: 471 10*3/uL — ABNORMAL HIGH (ref 150–400)
RBC: 2.63 MIL/uL — ABNORMAL LOW (ref 3.87–5.11)
RDW: 13.8 % (ref 11.5–15.5)
WBC: 10.1 10*3/uL (ref 4.0–10.5)
nRBC: 0 % (ref 0.0–0.2)

## 2020-10-17 LAB — URINALYSIS, COMPLETE (UACMP) WITH MICROSCOPIC
Bilirubin Urine: NEGATIVE
Glucose, UA: NEGATIVE mg/dL
Hgb urine dipstick: NEGATIVE
Ketones, ur: 5 mg/dL — AB
Leukocytes,Ua: NEGATIVE
Nitrite: NEGATIVE
Protein, ur: NEGATIVE mg/dL
Specific Gravity, Urine: 1.005 (ref 1.005–1.030)
pH: 8 (ref 5.0–8.0)

## 2020-10-17 LAB — LACTIC ACID, PLASMA
Lactic Acid, Venous: 3.2 mmol/L (ref 0.5–1.9)
Lactic Acid, Venous: 1.4 mmol/L (ref 0.5–1.9)

## 2020-10-17 LAB — TROPONIN I (HIGH SENSITIVITY): Troponin I (High Sensitivity): 7 ng/L (ref <18)

## 2020-10-17 MED ORDER — SODIUM CHLORIDE 0.9 % IV SOLN: 250.0000 mL | INTRAVENOUS | Status: DC

## 2020-10-17 MED ORDER — SODIUM CHLORIDE 0.9% FLUSH
3.0000 mL | INTRAVENOUS | Status: DC | PRN
  Administered 2020-10-18: 3 mL via INTRAVENOUS

## 2020-10-17 MED ORDER — FUROSEMIDE 20 MG PO TABS
20.0000 mg | ORAL_TABLET | Freq: Every day | ORAL | Status: DC
  Administered 2020-10-17 – 2020-10-20 (×4): 20 mg via ORAL
  Filled 2020-10-17 (×4): qty 1

## 2020-10-17 MED ORDER — PHENOL 1.4 % MT LIQD
1.0000 | OROMUCOSAL | Status: DC | PRN
  Filled 2020-10-17: qty 177

## 2020-10-17 MED ORDER — FLUCONAZOLE 100 MG PO TABS
400.0000 mg | ORAL_TABLET | Freq: Every day | ORAL | Status: DC
  Administered 2020-10-18 – 2020-10-19 (×2): 400 mg via ORAL
  Filled 2020-10-17 (×4): qty 4

## 2020-10-17 MED ORDER — MENTHOL 3 MG MT LOZG
1.0000 | LOZENGE | OROMUCOSAL | Status: DC | PRN
  Filled 2020-10-17: qty 9

## 2020-10-17 MED ORDER — POLYETHYLENE GLYCOL 3350 17 G PO PACK: 17.0000 g | PACK | Freq: Every day | ORAL | Status: DC | PRN

## 2020-10-17 MED ORDER — ONDANSETRON HCL 4 MG PO TABS
4.0000 mg | ORAL_TABLET | Freq: Four times a day (QID) | ORAL | Status: DC | PRN
  Filled 2020-10-17: qty 1

## 2020-10-17 MED ORDER — PREDNISONE 1 MG PO TABS
3.0000 mg | ORAL_TABLET | Freq: Every day | ORAL | Status: DC
  Administered 2020-10-18 – 2020-10-20 (×3): 3 mg via ORAL
  Filled 2020-10-17 (×3): qty 3

## 2020-10-17 MED ORDER — DIAZEPAM 5 MG PO TABS: 5.0000 mg | ORAL_TABLET | Freq: Four times a day (QID) | ORAL | Status: DC | PRN

## 2020-10-17 MED ORDER — MELOXICAM 7.5 MG PO TABS
15.0000 mg | ORAL_TABLET | Freq: Every day | ORAL | Status: DC
  Administered 2020-10-18 – 2020-10-20 (×3): 15 mg via ORAL
  Filled 2020-10-17 (×4): qty 2

## 2020-10-17 MED ORDER — GABAPENTIN 100 MG PO CAPS
100.0000 mg | ORAL_CAPSULE | ORAL | Status: DC
  Administered 2020-10-18 – 2020-10-20 (×3): 100 mg via ORAL
  Filled 2020-10-17 (×4): qty 1

## 2020-10-17 MED ORDER — PROBIOTIC PO TBEC: DELAYED_RELEASE_TABLET | Freq: Every day | ORAL | Status: DC

## 2020-10-17 MED ORDER — LORATADINE 10 MG PO TABS
10.0000 mg | ORAL_TABLET | Freq: Every day | ORAL | Status: DC
  Administered 2020-10-18 – 2020-10-20 (×3): 10 mg via ORAL
  Filled 2020-10-17 (×3): qty 1

## 2020-10-17 MED ORDER — ONDANSETRON HCL 4 MG/2ML IJ SOLN
4.0000 mg | Freq: Once | INTRAMUSCULAR | Status: AC
  Administered 2020-10-17: 4 mg via INTRAVENOUS
  Filled 2020-10-17: qty 2

## 2020-10-17 MED ORDER — ADULT MULTIVITAMIN W/MINERALS CH
1.0000 | ORAL_TABLET | Freq: Every day | ORAL | Status: DC
  Administered 2020-10-18 – 2020-10-20 (×3): 1 via ORAL
  Filled 2020-10-17 (×3): qty 1

## 2020-10-17 MED ORDER — ACETAMINOPHEN 650 MG RE SUPP: 650.0000 mg | RECTAL | Status: DC | PRN

## 2020-10-17 MED ORDER — PANTOPRAZOLE SODIUM 40 MG PO TBEC
40.0000 mg | DELAYED_RELEASE_TABLET | Freq: Every day | ORAL | Status: DC
  Administered 2020-10-18 – 2020-10-20 (×3): 40 mg via ORAL
  Filled 2020-10-17 (×3): qty 1

## 2020-10-17 MED ORDER — BETAMETHASONE DIPROPIONATE 0.05 % EX LOTN: TOPICAL_LOTION | Freq: Two times a day (BID) | CUTANEOUS | Status: DC | PRN

## 2020-10-17 MED ORDER — SENNA 8.6 MG PO TABS
1.0000 | ORAL_TABLET | Freq: Two times a day (BID) | ORAL | Status: DC
  Administered 2020-10-17 – 2020-10-19 (×4): 8.6 mg via ORAL
  Filled 2020-10-17 (×4): qty 1

## 2020-10-17 MED ORDER — ONDANSETRON HCL 4 MG/2ML IJ SOLN
4.0000 mg | Freq: Four times a day (QID) | INTRAMUSCULAR | Status: DC | PRN
  Filled 2020-10-17: qty 2

## 2020-10-17 MED ORDER — FENTANYL CITRATE (PF) 100 MCG/2ML IJ SOLN
50.0000 ug | INTRAMUSCULAR | Status: AC | PRN
  Administered 2020-10-17 (×2): 50 ug via INTRAVENOUS
  Filled 2020-10-17 (×2): qty 2

## 2020-10-17 MED ORDER — CYANOCOBALAMIN 1000 MCG/ML IJ SOLN
1000.0000 ug | INTRAMUSCULAR | Status: DC
  Administered 2020-10-18: 1000 ug via INTRAMUSCULAR
  Filled 2020-10-17: qty 1

## 2020-10-17 MED ORDER — LIDOCAINE VISCOUS HCL 2 % MT SOLN
15.0000 mL | OROMUCOSAL | Status: DC | PRN
  Filled 2020-10-17: qty 15

## 2020-10-17 MED ORDER — SODIUM CHLORIDE 0.9 % IV SOLN
1000.0000 mL | Freq: Once | INTRAVENOUS | Status: AC
  Administered 2020-10-17: 1000 mL via INTRAVENOUS

## 2020-10-17 MED ORDER — SODIUM CHLORIDE 0.9% FLUSH
3.0000 mL | Freq: Two times a day (BID) | INTRAVENOUS | Status: DC
  Administered 2020-10-17 – 2020-10-19 (×4): 3 mL via INTRAVENOUS

## 2020-10-17 MED ORDER — ALENDRONATE SODIUM 70 MG PO TABS
70.0000 mg | ORAL_TABLET | ORAL | Status: DC
Start: 2020-10-19 — End: 2020-10-18
  Filled 2020-10-17: qty 1

## 2020-10-17 MED ORDER — GABAPENTIN 400 MG PO CAPS
400.0000 mg | ORAL_CAPSULE | Freq: Every day | ORAL | Status: DC
  Administered 2020-10-17 – 2020-10-19 (×3): 400 mg via ORAL
  Filled 2020-10-17 (×3): qty 1

## 2020-10-17 MED ORDER — VITAMIN D 25 MCG (1000 UNIT) PO TABS
1000.0000 [IU] | ORAL_TABLET | Freq: Every day | ORAL | Status: DC
  Administered 2020-10-18 – 2020-10-20 (×3): 1000 [IU] via ORAL
  Filled 2020-10-17 (×3): qty 1

## 2020-10-17 MED ORDER — ACETAMINOPHEN 325 MG PO TABS
650.0000 mg | ORAL_TABLET | ORAL | Status: DC | PRN
  Administered 2020-10-17: 650 mg via ORAL
  Filled 2020-10-17: qty 2

## 2020-10-17 MED ORDER — ENOXAPARIN SODIUM 40 MG/0.4ML ~~LOC~~ SOLN
40.0000 mg | SUBCUTANEOUS | Status: DC
  Administered 2020-10-18 – 2020-10-20 (×3): 40 mg via SUBCUTANEOUS
  Filled 2020-10-17 (×3): qty 0.4

## 2020-10-17 MED ORDER — SODIUM CHLORIDE 0.9 % IV SOLN: INTRAVENOUS | Status: DC

## 2020-10-17 MED ORDER — BACLOFEN 10 MG PO TABS
5.0000 mg | ORAL_TABLET | Freq: Four times a day (QID) | ORAL | Status: DC
  Administered 2020-10-17 – 2020-10-18 (×2): 5 mg via ORAL
  Filled 2020-10-17 (×7): qty 0.5

## 2020-10-17 MED ORDER — HYDROMORPHONE HCL 1 MG/ML IJ SOLN
0.5000 mg | INTRAMUSCULAR | Status: DC | PRN
  Filled 2020-10-17: qty 1

## 2020-10-17 MED ORDER — ROPINIROLE HCL 1 MG PO TABS
1.0000 mg | ORAL_TABLET | Freq: Every evening | ORAL | Status: DC | PRN
  Administered 2020-10-17 – 2020-10-20 (×2): 1 mg via ORAL
  Filled 2020-10-17 (×2): qty 1

## 2020-10-17 MED ORDER — DOCUSATE SODIUM 100 MG PO CAPS
100.0000 mg | ORAL_CAPSULE | Freq: Two times a day (BID) | ORAL | Status: DC
  Administered 2020-10-17 – 2020-10-19 (×4): 100 mg via ORAL
  Filled 2020-10-17 (×4): qty 1

## 2020-10-17 MED ORDER — POTASSIUM CHLORIDE CRYS ER 20 MEQ PO TBCR
20.0000 meq | EXTENDED_RELEASE_TABLET | ORAL | Status: DC
  Administered 2020-10-19: 20 meq via ORAL
  Filled 2020-10-17 (×2): qty 1

## 2020-10-17 MED ORDER — BISACODYL 10 MG RE SUPP
10.0000 mg | Freq: Every day | RECTAL | Status: DC | PRN
  Filled 2020-10-17: qty 1

## 2020-10-17 MED ORDER — DIPHENOXYLATE-ATROPINE 2.5-0.025 MG PO TABS
1.0000 | ORAL_TABLET | Freq: Every day | ORAL | Status: DC
  Administered 2020-10-18 – 2020-10-20 (×2): 1 via ORAL
  Filled 2020-10-17 (×3): qty 1

## 2020-10-17 MED ORDER — IRBESARTAN 150 MG PO TABS
75.0000 mg | ORAL_TABLET | Freq: Every day | ORAL | Status: DC
  Administered 2020-10-18 – 2020-10-20 (×3): 75 mg via ORAL
  Filled 2020-10-17 (×3): qty 1

## 2020-10-17 MED ORDER — FENTANYL CITRATE (PF) 100 MCG/2ML IJ SOLN
50.0000 ug | Freq: Once | INTRAMUSCULAR | Status: AC
Start: 2020-10-17 — End: 2020-10-17
  Administered 2020-10-17: 50 ug via INTRAVENOUS
  Filled 2020-10-17: qty 2

## 2020-10-17 MED ORDER — RISAQUAD PO CAPS
1.0000 | ORAL_CAPSULE | Freq: Every day | ORAL | Status: DC
  Administered 2020-10-18 – 2020-10-20 (×3): 1 via ORAL
  Filled 2020-10-17 (×3): qty 1

## 2020-10-17 MED ORDER — MAGNESIUM OXIDE 400 MG PO TABS
400.0000 mg | ORAL_TABLET | ORAL | Status: DC
  Administered 2020-10-19: 400 mg via ORAL
  Filled 2020-10-17 (×3): qty 1

## 2020-10-17 MED ORDER — HYDROCODONE-ACETAMINOPHEN 10-325 MG PO TABS
1.0000 | ORAL_TABLET | ORAL | Status: DC | PRN
  Administered 2020-10-17 – 2020-10-18 (×4): 1 via ORAL
  Filled 2020-10-17 (×4): qty 1

## 2020-10-17 MED ORDER — SUCRALFATE 1 G PO TABS: 0.5000 g | ORAL_TABLET | Freq: Three times a day (TID) | ORAL | Status: DC | PRN

## 2020-10-17 NOTE — ED Notes (Signed)
Pt ambulated to bedside toilet with steady gait. Pt denies pain worse on ambulation. Pt ambulated with steady gait.

## 2020-10-17 NOTE — ED Provider Notes (Addendum)
[facility] Emergency Department Provider Note   ____________________________________________    I have reviewed the triage vital signs and the nursing notes.   HISTORY  Chief Complaint Confusion, shortness of breath, back pain    HPI Nicole Bass is a 70 y.o. female who is postop day 5 status post laminectomy with discectomy, synovial cyst resection who presents with multiple complaints.  Per husband patient has been confused over the last several days since discharge, that was attributed to morphine which was stopped however he reports that confusion does not seem to have improved.  They have been taking tramadol, last night she had a good night with very little pain and was moving around well this morning when she suddenly started to complain of severe low back pain.  She denies weakness in the lower extremities.  She also reports some shortness of breath  Past Medical History:  Diagnosis Date  . Anemia    iron everyday, 3 iron infusions last one August 2020  . Anxiety    nervous  . Arthritis    rheumatoid  . Collagen vascular disease ([place])   . Crohn's disease ([place])   . Crohn's disease ([place])   . DDD (degenerative disc disease), cervical   . DDD (degenerative disc disease), cervical   . DDD (degenerative disc disease), cervical 2003  . DDD (degenerative disc disease), cervical   . DDD (degenerative disc disease), lumbar   . Degenerative disc disease, cervical   . Dyspnea    out of shape  . GERD (gastroesophageal reflux disease)   . Hand weakness    left hand worse, small tremors  . Headache    with accident  . History of claustrophobia   . History of kidney stones   . HOH (hard of hearing)    left ear  . Hypertension     controlled on meds  . Lymphatic disorder   . Meniere's disease   . Motion sickness   . Nasal fracture 03/27/2020   due to fall  . Neuromuscular disorder ([place])   . Neuropathy    legs  . Peripheral neuropathy    . 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 UTI (urinary tract infection)    HO  . Vertigo    can be accompanied by nausea  . Wears dentures    upper dentures    Patient Active Problem List   Diagnosis Date Noted  . Lumbar stenosis 10/12/2020  . Swelling of limb 06/02/2020  . Cervical spondylosis 05/01/2020  . Atherosclerosis of native coronary artery of native heart without angina pectoris 04/27/2020  . Numbness and tingling in left hand 10/29/2019  . 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 Status post right partial knee replacement 02/12/2019  . Rotator cuff arthropathy, right 01/25/2019  . Iron deficiency anemia 09/13/2018  . Anemia, unspecified 09/09/2018  . Iron deficiency 09/04/2018  . Acute kidney injury ([place]) 08/23/2018  . HTN, goal below 140/80 06/04/2018  . Hyperthyroidism 02/08/2018  . Imbalance 12/18/2017  . Stomatitis, ulcerative 09/06/2017  . Dyspareunia in female 08/22/2017  . Vaginal atrophy 08/22/2017  . Obesity (BMI 30.0-34.9) 08/22/2017  . Varicose veins of leg with pain, left 04/05/2017  . Peripheral polyneuropathy 04/05/2017  . Lumbar radiculopathy 04/05/2017  . Tremor 02/13/2017  . Sensory neuropathy 02/13/2017  . Restless leg syndrome 02/13/2017  . Impingement syndrome of left shoulder 09/29/2016  . Acute diarrhea 09/06/2016  . History of pyelonephritis 09/06/2016  . Hypokalemia 09/06/2016  .  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 Pyelonephritis 08/29/2016  . Rheumatoid arthritis, unspecified ([place]) 08/29/2016  . Nontraumatic complete tear of right rotator cuff 03/08/2016  . Acute pain of right shoulder 01/27/2016  . Type 2 diabetes mellitus with diabetic neuropathy ([place]) 10/27/2015  . Closed fracture of distal end of right radius with routine healing 09/17/2015  . Crohn's colitis ([place]) 09/04/2015  . Acute bilateral low back pain without sciatica 03/23/2015  . SOB (shortness of breath) on exertion 01/15/2015  .  Pedal edema 01/15/2015  . Status post open reduction with internal fixation of fracture 11/27/2014  . Aftercare for healing traumatic fracture of lower arm 09/25/2014  . Painful rib 09/11/2014  . Primary osteoarthritis of left knee 04/24/2014  . Osteoporosis 03/17/2014  . Depression 03/17/2014  . B12 deficiency 03/17/2014  . Crohn's disease, unspecified, without complications ([place]) 78/29/5621  . Urolith 07/16/2013  . Right flank pain 07/16/2013  . Abdominal pain 07/16/2013    Past Surgical History:  Procedure Laterality Date  . ABDOMINAL HYSTERECTOMY    . APPENDECTOMY    . BACK SURGERY     x 3  . CERVICAL SPINE SURGERY     metal plate  . [place]   X 2  . CHOLECYSTECTOMY    . CLOSED REDUCTION NASAL FRACTURE N/A 04/09/2020   Procedure: CLOSED REDUCTION NASAL FRACTURE;  Surgeon: Margaretha Sheffield, MD;  Location: [place] [facility];  Service: ENT;  Laterality: N/A;  . [place]   removed 18 inches of colon,removed appendix  . COLONOSCOPY    . dental implants     3 teeth/ wears dentures  . DILATION AND CURETTAGE OF UTERUS  2004  . ELBOW FRACTURE SURGERY Left 2008  . FIBEROPTIC BRONCHOSCOPY N/A 06/01/2020   Procedure: BEDSIDE BRONCHOSCOPY FIBEROPTIC;  Surgeon: Ottie Glazier, MD;  Location: [facility];  Service: Thoracic;  Laterality: N/A;  . FRACTURE SURGERY    . JOINT REPLACEMENT Right 2015   partial knee replacement  . KYPHOPLASTY    . KYPHOPLASTY N/A 04/14/2015   Procedure: KYPHOPLASTY;  Surgeon: Hessie Knows, MD;  Location: [facility];  Service: Orthopedics;  Laterality: N/A;  . KYPHOPLASTY N/A 06/04/2015   Procedure: KYPHOPLASTY L-5;  Surgeon: Hessie Knows, MD;  Location: [facility];  Service: Orthopedics;  Laterality: N/A;  . KYPHOPLASTY    . LUMBAR LAMINECTOMY FOR EPIDURAL ABSCESS N/A 10/12/2020   Procedure: SYNOVIAL CYST RESECTION;  Surgeon: Deetta Perla, MD;  Location: [facility];  Service: Neurosurgery;  Laterality: N/A;  . MAXIMUM ACCESS (MAS)  TRANSFORAMINAL LUMBAR INTERBODY FUSION (TLIF) 1 LEVEL Left 10/12/2020   Procedure: OPEN L5/S1 TRANSFORAMINAL LUMBAR INTERBODY FUSION (TLIF) 1 LEVEL;  Surgeon: Deetta Perla, MD;  Location: [facility];  Service: Neurosurgery;  Laterality: Left;  Marland Kitchen MEDIAL PARTIAL KNEE REPLACEMENT Right 2015  . NECK/PLATE  3086  . OOPHORECTOMY Bilateral   . ORIF NASAL FRACTURE N/A 04/09/2020   Procedure: OPEN REDUCTION INTERNAL FIXATION (ORIF) NASAL FRACTURE;  Surgeon: Margaretha Sheffield, MD;  Location: [facility];  Service: ENT;  Laterality: N/A;  . REPAIR EXTENSOR TENDON Left 01/08/2019   Procedure: Realignment  EXTENSOR TENDON;  Surgeon: Hessie Knows, MD;  Location: [facility];  Service: Orthopedics;  Laterality: Left;  . REVERSE SHOULDER ARTHROPLASTY Right 04/18/2019   Procedure: REVERSE SHOULDER ARTHROPLASTY;  Surgeon: Corky Mull, MD;  Location: [facility];  Service: Orthopedics;  Laterality: Right;  . SHOULDER ARTHROSCOPY WITH ROTATOR CUFF REPAIR AND SUBACROMIAL DECOMPRESSION Right 04/13/2016   Procedure:  SHOULDER ARTHROSCOPY WITH ROTATOR CUFF REPAIR AND SUBACROMIAL DECOMPRESSION, release of long head biceps tendon;  Surgeon: Leanor Kail, MD;  Location: [facility];  Service: Orthopedics;  Laterality: Right;  . TOE SURGERY Right 2013  . TONSILLECTOMY  1995  . UPPER GI ENDOSCOPY    . WRIST FRACTURE SURGERY Bilateral     Prior to Admission medications   Medication Sig Start Date End Date Taking? Authorizing Provider  acetaminophen (TYLENOL) 650 MG CR tablet Take 1,300 mg by mouth every 8 (eight) hours as needed for pain.    [provider]  alendronate (FOSAMAX) 70 MG tablet Take 70 mg by mouth every Monday. Take with a full glass of water on an empty stomach.    [provider]  aspirin EC 81 MG tablet Take 81 mg by mouth daily. Swallow whole.    [provider]  Baclofen 5 MG TABS Take 5 mg by mouth every 6 (six) hours.    [provider]  betamethasone dipropionate 0.05 %  lotion Apply topically 2 (two) times daily as needed (Rash). Apply thin coat to affected areas twice daily for 2 weeks Patient not taking: Reported on 09/24/2020 09/07/20   Ralene Bathe, MD  cetirizine (ZYRTEC) 10 MG tablet Take 10 mg by mouth daily.    [provider]  chlorhexidine (PERIDEX) 0.12 % solution Use as directed 15 mLs in the mouth or throat as needed.     [provider]  cholecalciferol (VITAMIN D3) 25 MCG (1000 UT) tablet Take 1,000 Units by mouth daily.    [provider]  cyanocobalamin (,VITAMIN B-12,) 1000 MCG/ML injection Inject 1,000 mcg into the muscle every 30 (thirty) days.    [provider]  diazepam (VALIUM) 10 MG tablet Take 1 tablet (10 mg total) by mouth daily as needed (vaginal pain with intercourse, use before sexual activity per vagina). Patient taking differently: Take 10 mg by mouth as needed (vaginal pain with intercourse, use before sexual activity per vagina). 02/22/19   Hollice Espy, MD  diphenoxylate-atropine (LOMOTIL) 2.5-0.025 MG per tablet Take 1 tablet by mouth daily. am    [provider]  fluconazole (DIFLUCAN) 200 MG tablet Take 400 mg by mouth daily.    [provider]  furosemide (LASIX) 20 MG tablet Take 20 mg by mouth daily. am    [provider]  gabapentin (NEURONTIN) 100 MG capsule Take 100 mg by mouth every morning. 08/20/19   [provider]  gabapentin (NEURONTIN) 400 MG capsule Take 400 mg by mouth at bedtime.    [provider]  lidocaine (XYLOCAINE) 2 % solution Use as directed 15 mLs in the mouth or throat as needed for mouth pain.    [provider]  magnesium oxide (MAG-OX) 400 MG tablet Take 400 mg by mouth every Monday, Wednesday, and Friday. 3 times per week    [provider]  morphine (MSIR) 15 MG tablet Take 1 tablet (15 mg total) by mouth every 3 (three) hours as needed for up to 7 days for severe pain or moderate pain. 10/15/20  10/22/20  Meade Maw, MD  Multiple Vitamin (MULTIVITAMIN WITH MINERALS) TABS tablet Take 1 tablet by mouth daily.    [provider]  omeprazole (PRILOSEC) 40 MG capsule Take 40 mg by mouth daily before breakfast.     [provider]  potassium chloride SA (K-DUR,KLOR-CON) 20 MEQ tablet Take 20 mEq by mouth every Monday, Wednesday, and Friday.    [provider]  predniSONE (DELTASONE) 1 MG tablet Take 3 mg by mouth daily with breakfast.     [provider]  Probiotic Product (PROBIOTIC PO) Take 1 capsule by mouth daily.     [provider]  rOPINIRole (REQUIP) 1 MG tablet Take 1 mg by mouth at bedtime as needed (for restless legs).     [provider]  sucralfate (CARAFATE) 1 g tablet Take 0.5 g by mouth 3 (three) times daily with meals as needed (for stomach cramping (crohn's disease)).     [provider]  telmisartan (MICARDIS) 20 MG tablet Take 20 mg by mouth every Monday, Wednesday, and Friday. 08/30/18   [provider]     Allergies Ciprofloxacin, Levofloxacin, Methotrexate, Methotrexate derivatives, Other, Sulfa antibiotics, Trelegy ellipta [fluticasone-umeclidin-vilant], Cefazolin, Cefprozil, Cephalosporins, Enbrel [etanercept], Humira [adalimumab], Hydrocodone-acetaminophen, Lorabid [loracarbef], Meropenem, Oxycodone, Oxycodone-acetaminophen, and Primidone  Family History  Problem Relation Age of Onset  . Diabetes Mother   . Emphysema Mother   . Emphysema Father   . Colon cancer Maternal Grandfather   . Breast cancer Neg Hx   . Ovarian cancer Neg Hx     Social History Social History   Tobacco Use  . Smoking status: Never Smoker  . Smokeless tobacco: Never Used  Vaping Use  . Vaping Use: Never used  Substance Use Topics  . Alcohol use: No  . Drug use: No    Review of Systems  Constitutional: No fever/chills Eyes: No visual changes.  ENT: No sore throat. Cardiovascular: Denies chest  pain. Respiratory: As above Gastrointestinal: No abdominal pain.  No nausea, no vomiting.   Genitourinary: Negative for dysuria. Musculoskeletal: As above Skin: Negative for rash. Neurological: Negative for headaches or weakness   ____________________________________________   PHYSICAL EXAM:  VITAL SIGNS: ED Triage Vitals  Enc Vitals Group     BP 10/17/20 0909 (!) 152/116     Pulse Rate 10/17/20 0909 (!) 116     Resp 10/17/20 0909 18     Temp 10/17/20 0909 97.6 F (36.4 C)     Temp Source 10/17/20 0909 Oral     SpO2 10/17/20 0909 98 %     Weight 10/17/20 0911 79.4 kg (175 lb)     Height --      Head Circumference --      Peak Flow --      Pain Score --      Pain Loc --      Pain Edu? --      Excl. in [place]? --     Constitutional: Alert, appears appropriately oriented, having difficulty lying still secondary to pain Eyes: Conjunctivae are normal.  PERRLA Head: Atraumatic. Nose: No congestion/rhinnorhea. Mouth/Throat: Mucous membranes are moist.    Cardiovascular: Normal rate, regular rhythm. Grossly normal heart sounds.  Good peripheral circulation. Respiratory: Increased respiratory effort with tachypnea.  No retractions. Lungs CTAB. Gastrointestinal: Soft and nontender. No distention.    Musculoskeletal: Normal strength in the lower extremities.  Warm and well perfused Neurologic:  Normal speech and language. No gross focal neurologic deficits are appreciated.  Skin:  Skin is warm, dry and intact. No rash noted. Psychiatric: Mood and affect are normal. Speech and behavior are normal.  ____________________________________________   LABS (all labs ordered are listed, but only abnormal results are displayed)  Labs Reviewed  CBC WITH DIFFERENTIAL/PLATELET - Abnormal; Notable for the following components:      Result Value   RBC 2.63 (*)    Hemoglobin 8.3 (*)  HCT 25.6 (*)    Platelets 471 (*)    Neutro Abs 8.1 (*)    All other components within normal limits   COMPREHENSIVE METABOLIC PANEL - Abnormal; Notable for the following components:   Potassium 3.2 (*)    Glucose, Bld 128 (*)    Calcium 8.4 (*)    Albumin 2.6 (*)    All other components within normal limits  URINALYSIS, COMPLETE (UACMP) WITH MICROSCOPIC - Abnormal; Notable for the following components:   Color, Urine YELLOW (*)    APPearance CLEAR (*)    Ketones, ur 5 (*)    Bacteria, UA RARE (*)    All other components within normal limits  LACTIC ACID, PLASMA - Abnormal; Notable for the following components:   Lactic Acid, Venous 3.2 (*)    All other components within normal limits  URINE CULTURE  CULTURE, BLOOD (ROUTINE X 2)  CULTURE, BLOOD (ROUTINE X 2)  LACTIC ACID, PLASMA  TROPONIN I (HIGH SENSITIVITY)   ____________________________________________  EKG  ED ECG REPORT I, Lavonia Drafts, the attending physician, personally viewed and interpreted this ECG.  Date: 10/17/2020  Rhythm: normal sinus rhythm QRS Axis: normal Intervals: normal ST/T Wave abnormalities: normal Narrative Interpretation: no evidence of acute ischemia  ____________________________________________  RADIOLOGY  Chest x-ray viewed by me, no infiltrate or effusion ____________________________________________   PROCEDURES  Procedure(s) performed: No  Procedures   Critical Care performed: No ____________________________________________   INITIAL IMPRESSION / ASSESSMENT AND PLAN / ED COURSE  Pertinent labs & imaging results that were available during my care of the patient were reviewed by me and considered in my medical decision making (see chart for details).  Patient presents with multiple complaints as detailed above.  Altered mental status as described by husband possibly related to morphine although he notes that they stopped morphine on Thursday night and she continues to have episodes of confusion.  Back pain today may be related to morphine wearing off or may be related to  shifting of surgical hardware, afebrile less likely infection but that is certainly a possibility.  Shortness of breath: Unclear whether this may be related to anxiety, pain.  Notably the patient does have anemia which could be contributing as well, she has a history of iron deficiency   Patient treated with IV fentanyl with improvement of pain pending labs  Lab work notable for lactic acid of 3.2, normal white blood cell count, mild anemia not significantly changed from postop.  Presentation is not consistent with sepsis, chest x-ray is without evidence of pneumonia, patient denies dysuria, too early for postop infection  Patient received a second dose of IV fentanyl for pain.  Discussed with Dr. Cari Caraway of neurosurgery who will see the patient in the ED, appreciate his consultation.       ____________________________________________   FINAL CLINICAL IMPRESSION(S) / ED DIAGNOSES  Final diagnoses:  Postoperative pain after spinal surgery        Note:  This document was prepared using Dragon voice recognition software and may include unintentional dictation errors.   Lavonia Drafts, MD 10/17/20 1134    Lavonia Drafts, MD 10/17/20 1135

## 2020-10-17 NOTE — ED Triage Notes (Signed)
Pt in with AMS per husband, had spinal cyst surgery on 2/28, discharged on 3/3. tachypneic in triage, sats 92-97% appears to be grimacing in pain. Husband says she woke up in excruciating pain, taking Tramadol, muscle relaxers for pain. Poor intake since surgery

## 2020-10-17 NOTE — H&P (Signed)
Referring Physician:  No referring provider defined for this encounter.  Primary Physician:  [name], MD  Chief Complaint:  Unremitting pain  History of Present Illness: 10/17/2020 Nicole Bass is a 70 y.o. female who presents with the chief complaint of pain.  She is POD5 from TLIF.  She was doing well on POD3 and was discharged.  She had some altered mental status on morphine, so stopped taking morphine on 3/3.  She took tramadol yesterday. She was ok this morning but then had onset of terrible pain in her back.   She denies weakness or any other symptoms.  Review of Systems:  A 10 point review of systems is negative, except for the pertinent positives and negatives detailed in the HPI.  Past Medical History: Past Medical History:  Diagnosis Date  . Anemia    iron everyday, 3 iron infusions last one August 2020  . Anxiety    nervous  . Arthritis    rheumatoid  . Collagen vascular disease ([place])   . Crohn's disease ([place])   . Crohn's disease ([place])   . DDD (degenerative disc disease), cervical   . DDD (degenerative disc disease), cervical   . DDD (degenerative disc disease), cervical 2003  . DDD (degenerative disc disease), cervical   . DDD (degenerative disc disease), lumbar   . Degenerative disc disease, cervical   . Dyspnea    out of shape  . GERD (gastroesophageal reflux disease)   . Hand weakness    left hand worse, small tremors  . Headache    with accident  . History of claustrophobia   . History of kidney stones   . HOH (hard of hearing)    left ear  . Hypertension     controlled on meds  . Lymphatic disorder   . Meniere's disease   . Motion sickness   . Nasal fracture 03/27/2020   due to fall  . Neuromuscular disorder ([place])   . Neuropathy    legs  . Peripheral neuropathy   . 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 UTI (urinary tract infection)    HO  . Vertigo    can be accompanied by nausea  . Wears dentures    upper dentures     Past Surgical History: Past Surgical History:  Procedure Laterality Date  . ABDOMINAL HYSTERECTOMY    . APPENDECTOMY    . BACK SURGERY     x 3  . CERVICAL SPINE SURGERY     metal plate  . [place]   X 2  . CHOLECYSTECTOMY    . CLOSED REDUCTION NASAL FRACTURE N/A 04/09/2020   Procedure: CLOSED REDUCTION NASAL FRACTURE;  Surgeon: Margaretha Sheffield, MD;  Location: [facility];  Service: ENT;  Laterality: N/A;  . [place]   removed 18 inches of colon,removed appendix  . COLONOSCOPY    . dental implants     3 teeth/ wears dentures  . DILATION AND CURETTAGE OF UTERUS  2004  . ELBOW FRACTURE SURGERY Left 2008  . FIBEROPTIC BRONCHOSCOPY N/A 06/01/2020   Procedure: BEDSIDE BRONCHOSCOPY FIBEROPTIC;  Surgeon: Ottie Glazier, MD;  Location: [facility];  Service: Thoracic;  Laterality: N/A;  . FRACTURE SURGERY    . JOINT REPLACEMENT Right 2015   partial knee replacement  . KYPHOPLASTY    . KYPHOPLASTY N/A 04/14/2015   Procedure: KYPHOPLASTY;  Surgeon: Hessie Knows, MD;  Location: [facility];  Service: Orthopedics;  Laterality: N/A;  .  KYPHOPLASTY N/A 06/04/2015   Procedure: KYPHOPLASTY L-5;  Surgeon: Hessie Knows, MD;  Location: [facility];  Service: Orthopedics;  Laterality: N/A;  . KYPHOPLASTY    . LUMBAR LAMINECTOMY FOR EPIDURAL ABSCESS N/A 10/12/2020   Procedure: SYNOVIAL CYST RESECTION;  Surgeon: Deetta Perla, MD;  Location: [facility];  Service: Neurosurgery;  Laterality: N/A;  . MAXIMUM ACCESS (MAS) TRANSFORAMINAL LUMBAR INTERBODY FUSION (TLIF) 1 LEVEL Left 10/12/2020   Procedure: OPEN L5/S1 TRANSFORAMINAL LUMBAR INTERBODY FUSION (TLIF) 1 LEVEL;  Surgeon: Deetta Perla, MD;  Location: [facility];  Service: Neurosurgery;  Laterality: Left;  [place] MEDIAL PARTIAL KNEE REPLACEMENT Right 2015  . NECK/PLATE  4944  . OOPHORECTOMY Bilateral   . ORIF NASAL FRACTURE N/A 04/09/2020   Procedure: OPEN REDUCTION INTERNAL FIXATION (ORIF) NASAL FRACTURE;  Surgeon: Margaretha Sheffield, MD;  Location: [place] [facility];  Service: ENT;  Laterality: N/A;  . REPAIR EXTENSOR TENDON Left 01/08/2019   Procedure: Realignment  EXTENSOR TENDON;  Surgeon: Hessie Knows, MD;  Location: [facility];  Service: Orthopedics;  Laterality: Left;  . REVERSE SHOULDER ARTHROPLASTY Right 04/18/2019   Procedure: REVERSE SHOULDER ARTHROPLASTY;  Surgeon: Corky Mull, MD;  Location: [facility];  Service: Orthopedics;  Laterality: Right;  . SHOULDER ARTHROSCOPY WITH ROTATOR CUFF REPAIR AND SUBACROMIAL DECOMPRESSION Right 04/13/2016   Procedure: SHOULDER ARTHROSCOPY WITH ROTATOR CUFF REPAIR AND SUBACROMIAL DECOMPRESSION, release of long head biceps tendon;  Surgeon: Leanor Kail, MD;  Location: [facility];  Service: Orthopedics;  Laterality: Right;  . TOE SURGERY Right 2013  . TONSILLECTOMY  1995  . UPPER GI ENDOSCOPY    . WRIST FRACTURE SURGERY Bilateral     Allergies: Allergies as of 10/17/2020 - Review Complete 10/17/2020  Allergen Reaction Noted  . Ciprofloxacin Itching and Rash 08/31/2016  . Levofloxacin Hives and Rash 01/08/2014  . Methotrexate  11/21/2017  . Methotrexate derivatives  11/21/2017  . Other Other (See Comments) and Rash 07/16/2013  . Sulfa antibiotics Other (See Comments) 07/16/2013  . Trelegy ellipta [fluticasone-umeclidin-vilant]  09/24/2020  . Cefazolin Itching and Rash 04/13/2015  . Cefprozil Rash 07/16/2013  . Cephalosporins Itching and Rash 04/13/2015  . Enbrel [etanercept] Itching and Rash 04/13/2015  . Humira [adalimumab] Itching and Rash 08/04/2015  . Hydrocodone-acetaminophen Itching and Other (See Comments) 04/29/2015  . Lorabid [loracarbef] Itching and Rash 04/13/2015  . Meropenem Itching and Rash 04/13/2015  . Oxycodone Itching, Other (See Comments), and Rash 09/06/2017  . Oxycodone-acetaminophen Itching 09/06/2017  . Primidone Itching and Rash 04/13/2015    Medications: No current facility-administered medications for this encounter.  Current  Outpatient Medications:  .  acetaminophen (TYLENOL) 650 MG CR tablet, Take 1,300 mg by mouth every 8 (eight) hours as needed for pain., Disp: , Rfl:  .  alendronate (FOSAMAX) 70 MG tablet, Take 70 mg by mouth every Monday. Take with a full glass of water on an empty stomach., Disp: , Rfl:  .  aspirin EC 81 MG tablet, Take 81 mg by mouth daily. Swallow whole., Disp: , Rfl:  .  Baclofen 5 MG TABS, Take 5 mg by mouth every 6 (six) hours., Disp: , Rfl:  .  betamethasone dipropionate 0.05 % lotion, Apply topically 2 (two) times daily as needed (Rash). Apply thin coat to affected areas twice daily for 2 weeks (Patient not taking: Reported on 09/24/2020), Disp: 60 mL, Rfl: 0 .  cetirizine (ZYRTEC) 10 MG tablet, Take 10 mg by mouth daily., Disp: , Rfl:  .  chlorhexidine (PERIDEX) 0.12 % solution, Use as directed  15 mLs in the mouth or throat as needed. , Disp: , Rfl:  .  cholecalciferol (VITAMIN D3) 25 MCG (1000 UT) tablet, Take 1,000 Units by mouth daily., Disp: , Rfl:  .  cyanocobalamin (,VITAMIN B-12,) 1000 MCG/ML injection, Inject 1,000 mcg into the muscle every 30 (thirty) days., Disp: , Rfl:  .  diazepam (VALIUM) 10 MG tablet, Take 1 tablet (10 mg total) by mouth daily as needed (vaginal pain with intercourse, use before sexual activity per vagina). (Patient taking differently: Take 10 mg by mouth as needed (vaginal pain with intercourse, use before sexual activity per vagina).), Disp: 30 tablet, Rfl: 0 .  diphenoxylate-atropine (LOMOTIL) 2.5-0.025 MG per tablet, Take 1 tablet by mouth daily. am, Disp: , Rfl:  .  fluconazole (DIFLUCAN) 200 MG tablet, Take 400 mg by mouth daily., Disp: , Rfl:  .  furosemide (LASIX) 20 MG tablet, Take 20 mg by mouth daily. am, Disp: , Rfl:  .  gabapentin (NEURONTIN) 100 MG capsule, Take 100 mg by mouth every morning., Disp: , Rfl:  .  gabapentin (NEURONTIN) 400 MG capsule, Take 400 mg by mouth at bedtime., Disp: , Rfl:  .  lidocaine (XYLOCAINE) 2 % solution, Use as  directed 15 mLs in the mouth or throat as needed for mouth pain., Disp: , Rfl:  .  magnesium oxide (MAG-OX) 400 MG tablet, Take 400 mg by mouth every Monday, Wednesday, and Friday. 3 times per week, Disp: , Rfl:  .  morphine (MSIR) 15 MG tablet, Take 1 tablet (15 mg total) by mouth every 3 (three) hours as needed for up to 7 days for severe pain or moderate pain., Disp: 50 tablet, Rfl: 0 .  Multiple Vitamin (MULTIVITAMIN WITH MINERALS) TABS tablet, Take 1 tablet by mouth daily., Disp: , Rfl:  .  omeprazole (PRILOSEC) 40 MG capsule, Take 40 mg by mouth daily before breakfast. , Disp: , Rfl:  .  potassium chloride SA (K-DUR,KLOR-CON) 20 MEQ tablet, Take 20 mEq by mouth every Monday, Wednesday, and Friday., Disp: , Rfl:  .  predniSONE (DELTASONE) 1 MG tablet, Take 3 mg by mouth daily with breakfast. , Disp: , Rfl:  .  Probiotic Product (PROBIOTIC PO), Take 1 capsule by mouth daily. , Disp: , Rfl:  .  rOPINIRole (REQUIP) 1 MG tablet, Take 1 mg by mouth at bedtime as needed (for restless legs). , Disp: , Rfl:  .  sucralfate (CARAFATE) 1 g tablet, Take 0.5 g by mouth 3 (three) times daily with meals as needed (for stomach cramping (crohn's disease)). , Disp: , Rfl:  .  telmisartan (MICARDIS) 20 MG tablet, Take 20 mg by mouth every Monday, Wednesday, and Friday., Disp: , Rfl:    Social History: Social History   Tobacco Use  . Smoking status: Never Smoker  . Smokeless tobacco: Never Used  Vaping Use  . Vaping Use: Never used  Substance Use Topics  . Alcohol use: No  . Drug use: No    Family Medical History: Family History  Problem Relation Age of Onset  . Diabetes Mother   . Emphysema Mother   . Emphysema Father   . Colon cancer Maternal Grandfather   . Breast cancer Neg Hx   . Ovarian cancer Neg Hx     Physical Examination: Vitals:   10/17/20 1000 10/17/20 1030  BP: 124/64 108/67  Pulse: 91 (!) 102  Resp: (!) 22 (!) 25  Temp:    SpO2: 100% 97%   Heart sounds normal no MRG.  Chest Clear to Auscultation Bilaterally.   General: Patient is well developed, well nourished, and in moderate distress.  Psychiatric: Patient is anxious.  Head:  Pupils equal, round, and reactive to light.  ENT:  Oral mucosa appears well hydrated.  Neck:   Supple.  Full range of motion.  Respiratory: Patient is breathing without any difficulty.  Extremities: No edema.  Vascular: Palpable pulses in dorsal pedal vessels.  Skin:   On exposed skin, there are no abnormal skin lesions.  NEUROLOGICAL:  General: In no acute distress.   Awake, alert, oriented to person, place, and time.  Pupils equal round and reactive to light.  Facial tone is symmetric.  Tongue protrusion is midline.  There is no pronator drift.   Strength: Side Biceps Triceps Deltoid Interossei Grip Wrist Ext. Wrist Flex.  R 5 5 5 5 5 5 5   L 5 5 5 5 5 5 5    Side Iliopsoas Quads Hamstring PF DF EHL  R 5 5 5 5 5 5   L 5 5 5 5 5 5    Incision c/d/i  Imaging: Prior imaging reviewed.  I have personally reviewed the images and agree with the above interpretation.  Labs: CBC Latest Ref Rng & Units 10/17/2020 10/12/2020 07/24/2020  WBC 4.0 - 10.5 K/uL 10.1 11.7(H) 7.5  Hemoglobin 12.0 - 15.0 g/dL 8.3(L) 8.4(L) 12.4  Hematocrit 36.0 - 46.0 % 25.6(L) 26.0(L) 36.7  Platelets 150 - 400 K/uL 471(H) 336 249   UA - no infection, +ketones Lactate 3.2    Assessment and Plan: Ms. Givans is a pleasant 70 y.o. female with pain exacerbation after surgery. She has no worrying findings concerning for infection.  She appears to be dehydrated.  - admit - pain control - PTOT - hydrate  Angeliyah Kirkey K. Izora Ribas MD, [place] [facility]

## 2020-10-17 NOTE — ED Notes (Signed)
Pt placed on 3L/min [place] after fentanyl admin due to pt breathing shallow and O2 sat dropping to mid 80's, MD aware.

## 2020-10-17 NOTE — ED Notes (Signed)
Pt presents to ED with c/o of AMS per husband and also "severe pain". Pt recently had a surgery to remove a cyst on 2/28 and as recentaly discharged a few days ago and RX'ed morphine. Morphine was stopped on Thursday due to pt being altered and not herself per husband. Pt started taking tramadol in place of morphine and per husband "it was working". Husband states she has been doing okay and up ambulating but states this morning she woke up fine and then all of a sudden started acting "not herself". Pt and husband deny fever and chills and also deny N/V/D. Pt is A&Ox4 but is constantly moving around in the bed due to pain. Pt states pain is in her lower back where the surgery took place. Pt denies urinary symptoms at this time.

## 2020-10-17 NOTE — ED Notes (Signed)
Informed RN bed assigned 

## 2020-10-18 LAB — SARS CORONAVIRUS 2 (TAT 6-24 HRS): SARS Coronavirus 2: NEGATIVE

## 2020-10-18 MED ORDER — ACETAMINOPHEN 500 MG PO TABS
1000.0000 mg | ORAL_TABLET | Freq: Three times a day (TID) | ORAL | Status: DC
  Administered 2020-10-18 – 2020-10-20 (×6): 1000 mg via ORAL
  Filled 2020-10-18 (×6): qty 2

## 2020-10-18 MED ORDER — POTASSIUM CHLORIDE CRYS ER 20 MEQ PO TBCR
30.0000 meq | EXTENDED_RELEASE_TABLET | Freq: Two times a day (BID) | ORAL | Status: AC
  Administered 2020-10-18 (×2): 30 meq via ORAL
  Filled 2020-10-18 (×2): qty 1

## 2020-10-18 MED ORDER — BACLOFEN 10 MG PO TABS
10.0000 mg | ORAL_TABLET | Freq: Four times a day (QID) | ORAL | Status: DC
  Administered 2020-10-18 – 2020-10-20 (×8): 10 mg via ORAL
  Filled 2020-10-18 (×10): qty 1

## 2020-10-18 NOTE — Evaluation (Signed)
Physical Therapy Evaluation Patient Details Name: Nicole Bass MRN: [id number] DOB: 1951/04/19 Today's Date: 10/18/2020   History of Present Illness  Pt is a 70 y.o. female presenting to hospital with confusion, SOB, and back pain.  Recent L L5/S1 transforaminal interbody fusion, facetectomy, laminectomy with discectomy 10/12/20.  Pt admitted with pain exacerbation s/p surgery.  PMH includes anemia, anxiety, RA, collagen vascular disease, Crohn's disease, cervical and lumbar DDD, dyspnea, L hand weakness and tremors, HA, h/o claustrophobia, HOH L ear, htn, Meniere's disease, motion sickness, nasal fx, peripheral neuropathy, vertigo, R reverse TSR, s/p R partial knee replacement, back sx x3, c-spine sx, kyphoplasty, B wrist fx sx.  Clinical Impression  Prior to hospital admission, pt was recently ambulating at home with RW (s/p TLIF surgery 10/12/20); lives with her husband.  Currently pt is modified independent with bed mobility (via logrolling); CGA with transfers; and CGA ambulating up to 120 feet with RW.  Pt's HR 103 bpm at rest and increased up to 116-120 bpm with activity; O2 sats 95% or greater on room air.  Pain low back 6/10 beginning of session at rest and increased to 8/10 end of session (nurse notified of pt's request for pain meds).  Therapist educated pt and pt's husband on appropriate fit and donning/doffing of back brace: both verbalizing appropriate understanding.  Pt oriented to person, [facility], time, and situation.  Pt would benefit from skilled PT to address noted impairments and functional limitations (see below for any additional details).  Upon hospital discharge, pt would benefit from [place].    Follow Up Recommendations Home health PT;Supervision for mobility/OOB    Equipment Recommendations  None recommended by PT (pt has RW at home already)    Recommendations for Other Services OT consult     Precautions / Restrictions Precautions Precautions:  Fall;Back Precaution Booklet Issued: Yes (comment) Precaution Comments: [place] Lumbar Brace: may remover when in bed; may ambulate to bathroom without brace; apply/remove brace while sitting; may remove brace to shower Required Braces or Orthoses: Spinal Brace Spinal Brace: Lumbar corset;Applied in sitting position Restrictions Weight Bearing Restrictions: No      Mobility  Bed Mobility Overal bed mobility: Needs Assistance Bed Mobility: Rolling;Sidelying to Sit Rolling: Modified independent (Device/Increase time) Sidelying to sit: Modified independent (Device/Increase time);HOB elevated       General bed mobility comments: mild increased effort for pt to perform on own    Transfers Overall transfer level: Needs assistance Equipment used: Rolling walker (2 wheeled) Transfers: Sit to/from Stand Sit to Stand: Min guard         General transfer comment: fairly strong stand from bed and from toilet up to RW; controlled descent sitting noted  Ambulation/Gait Ambulation/Gait assistance: Min guard Gait Distance (Feet):  (20 feet (to bathroom); 120 feet) Assistive device: Rolling walker (2 wheeled)   Gait velocity: mildly decreased   General Gait Details: step through steady gait with RW (appearing more cautious but safe)  Stairs            Wheelchair Mobility    Modified Rankin (Stroke Patients Only)       Balance Overall balance assessment: Needs assistance Sitting-balance support: No upper extremity supported;Feet supported Sitting balance-Leahy Scale: Good Sitting balance - Comments: steady sitting reaching within BOS   Standing balance support: No upper extremity supported Standing balance-Leahy Scale: Good Standing balance comment: steady standing washing hands at sink  Pertinent Vitals/Pain Pain Assessment: 0-10 Pain Score: 8  Pain Location: low back Pain Descriptors / Indicators: Aching;Sore Pain  Intervention(s): Limited activity within patient's tolerance;Monitored during session;Repositioned;Patient requesting pain meds-RN notified    Home Living Family/patient expects to be discharged to:: Private residence Living Arrangements: Spouse/significant other Available Help at Discharge: Family;Available PRN/intermittently (Husband at work throughout the day) Type of Home: House Home Access: Stairs to enter Entrance Stairs-Rails: Can reach both Entrance Stairs-Number of Steps: 3 Home Layout: Multi-level;Able to live on main level with bedroom/bathroom Home Equipment: Shower seat;Hand held shower head;Adaptive equipment      Prior Function Level of Independence: Independent         Comments: Pt ambulating with RW at home (did not use a walker prior to back surgery).  Per OT pt independent with ADL's and husband has been helping since recent discharge.     Hand Dominance   Dominant Hand: Right    Extremity/Trunk Assessment   Upper Extremity Assessment Upper Extremity Assessment: Defer to OT evaluation;Overall WFL for tasks assessed    Lower Extremity Assessment Lower Extremity Assessment: Overall WFL for tasks assessed (pt reporting no numbness/tingling in B LE's)    Cervical / Trunk Assessment Cervical / Trunk Assessment: Normal  Communication   Communication: No difficulties  Cognition Arousal/Alertness: Awake/alert Behavior During Therapy: WFL for tasks assessed/performed Overall Cognitive Status: Within Functional Limits for tasks assessed                                 General Comments: Oriented to person, [facility]; time, and situation      General Comments   Nursing cleared pt for participation in physical therapy.  Pt agreeable to PT session.  Pt's husband present during session.    Exercises     Assessment/Plan    PT Assessment Patient needs continued PT services  PT Problem List Decreased strength;Decreased activity  tolerance;Decreased balance;Decreased mobility;Decreased knowledge of use of DME;Decreased safety awareness;Pain;Decreased knowledge of precautions       PT Treatment Interventions DME instruction;Gait training;Stair training;Functional mobility training;Therapeutic activities;Therapeutic exercise;Balance training;Neuromuscular re-education;Patient/family education    PT Goals (Current goals can be found in the Care Plan section)  Acute Rehab PT Goals Patient Stated Goal: to go home and improve pain PT Goal Formulation: With patient Time For Goal Achievement: 11/01/20 Potential to Achieve Goals: Good    Frequency 7X/week   Barriers to discharge        Co-evaluation               AM-PAC PT "6 Clicks" Mobility  Outcome Measure Help needed turning from your back to your side while in a flat bed without using bedrails?: None Help needed moving from lying on your back to sitting on the side of a flat bed without using bedrails?: None Help needed moving to and from a bed to a chair (including a wheelchair)?: A Little Help needed standing up from a chair using your arms (e.g., wheelchair or bedside chair)?: A Little Help needed to walk in hospital room?: A Little Help needed climbing 3-5 steps with a railing? : A Little 6 Click Score: 20    End of Session Equipment Utilized During Treatment: Gait belt;Back brace Activity Tolerance: Patient limited by pain Patient left: in chair;with call bell/phone within reach;with chair alarm set;with nursing/sitter in room Nurse Communication: Mobility status;Precautions;Patient requests pain meds PT Visit Diagnosis: Muscle weakness (generalized) (M62.81);Difficulty in walking,  not elsewhere classified (R26.2);Pain    Time: 2174-7159 PT Time Calculation (min) (ACUTE ONLY): 30 min   Charges:   PT Evaluation $PT Eval Low Complexity: 1 Low PT Treatments $Therapeutic Activity: 8-22 mins       Leitha Bleak, PT 10/18/20, 1:18  PM

## 2020-10-18 NOTE — Progress Notes (Signed)
Progress Note   Date: 10/18/2020  POD#6 L5/S1 TLIF  24 Hour events: POD6: Continued pain.  No new neurological symptoms.  POD3: She is much improved.  She mobilized well yesterday  POD2: She is having lots of pain.  Otherwise is ambulatory and has no new neurologic complaints.  POD1: Nicole Bass is doing well after surgery. She endorses expected back pain but denies leg pain or numbness. Her drain put out 45 cc. She is starting to tolerate diet. Her foley catheter is in place. She is getting pain control with pain medication.     Vital Signs: Temp:  [97.8 F (36.6 C)-98.5 F (36.9 C)] 97.9 F (36.6 C) (03/06 0856) Pulse Rate:  [91-103] 97 (03/06 0856) Resp:  [13-25] 20 (03/06 0856) BP: (108-137)/(60-74) 122/69 (03/06 0856) SpO2:  [97 %-100 %] 98 % (03/06 0856) Temp (24hrs), Avg:98.1 F (36.7 C), Min:97.8 F (36.6 C), Max:98.5 F (36.9 C)  Weight: 79.4 kg   Problem List Patient Active Problem List   Diagnosis Date Noted  . Fusion of lumbar spine 10/17/2020  . Lumbar stenosis 10/12/2020  . Swelling of limb 06/02/2020  . Cervical spondylosis 05/01/2020  . Atherosclerosis of native coronary artery of native heart without angina pectoris 04/27/2020  . Numbness and tingling in left hand 10/29/2019  . 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 Status post right partial knee replacement 02/12/2019  . Rotator cuff arthropathy, right 01/25/2019  . Iron deficiency anemia 09/13/2018  . Anemia, unspecified 09/09/2018  . Iron deficiency 09/04/2018  . Acute kidney injury ([place]) 08/23/2018  . HTN, goal below 140/80 06/04/2018  . Hyperthyroidism 02/08/2018  . Imbalance 12/18/2017  . Stomatitis, ulcerative 09/06/2017  . Dyspareunia in female 08/22/2017  . Vaginal atrophy 08/22/2017  . Obesity (BMI 30.0-34.9) 08/22/2017  . Varicose veins of leg with pain, left 04/05/2017  . Peripheral polyneuropathy 04/05/2017  . Lumbar radiculopathy 04/05/2017  .  Tremor 02/13/2017  . Sensory neuropathy 02/13/2017  . Restless leg syndrome 02/13/2017  . Impingement syndrome of left shoulder 09/29/2016  . Acute diarrhea 09/06/2016  . History of pyelonephritis 09/06/2016  . Hypokalemia 09/06/2016  . 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 Pyelonephritis 08/29/2016  . Rheumatoid arthritis, unspecified ([place]) 08/29/2016  . Nontraumatic complete tear of right rotator cuff 03/08/2016  . Acute pain of right shoulder 01/27/2016  . Type 2 diabetes mellitus with diabetic neuropathy ([place]) 10/27/2015  . Closed fracture of distal end of right radius with routine healing 09/17/2015  . Crohn's colitis ([place]) 09/04/2015  . Acute bilateral low back pain without sciatica 03/23/2015  . SOB (shortness of breath) on exertion 01/15/2015  . Pedal edema 01/15/2015  . Status post open reduction with internal fixation of fracture 11/27/2014  . Aftercare for healing traumatic fracture of lower arm 09/25/2014  . Painful rib 09/11/2014  . Primary osteoarthritis of left knee 04/24/2014  . Osteoporosis 03/17/2014  . Depression 03/17/2014  . B12 deficiency 03/17/2014  . Crohn's disease, unspecified, without complications ([place]) 36/46/8032  . Urolith 07/16/2013  . Right flank pain 07/16/2013  . Abdominal pain 07/16/2013    Medications: Scheduled Meds: . acidophilus  1 capsule Oral Daily  . [START ON 10/19/2020] alendronate  70 mg Oral Q Mon  . baclofen  5 mg Oral Q6H  . cholecalciferol  1,000 Units Oral Daily  . cyanocobalamin  1,000 mcg Intramuscular Q30 days  . diphenoxylate-atropine  1 tablet Oral Daily  . docusate sodium  100 mg Oral BID  .  enoxaparin (LOVENOX) injection  40 mg Subcutaneous Q24H  . fluconazole  400 mg Oral Daily  . furosemide  20 mg Oral Daily  . gabapentin  100 mg Oral BH-q7a  . gabapentin  400 mg Oral QHS  . irbesartan  75 mg Oral Daily  . loratadine  10 mg Oral Daily  . [START ON 10/19/2020] magnesium oxide  400 mg Oral Q M,W,F  .  meloxicam  15 mg Oral Daily  . multivitamin with minerals  1 tablet Oral Daily  . pantoprazole  40 mg Oral Daily  . [START ON 10/19/2020] potassium chloride SA  20 mEq Oral Q M,W,F  . predniSONE  3 mg Oral Q breakfast  . senna  1 tablet Oral BID  . sodium chloride flush  3 mL Intravenous Q12H   Continuous Infusions: . sodium chloride    . sodium chloride 125 mL/hr at 10/17/20 1554   PRN Meds:.acetaminophen **OR** acetaminophen, betamethasone dipropionate, bisacodyl, diazepam, HYDROcodone-acetaminophen, HYDROmorphone (DILAUDID) injection, lidocaine, menthol-cetylpyridinium **OR** phenol, ondansetron **OR** ondansetron (ZOFRAN) IV, polyethylene glycol, rOPINIRole, sodium chloride flush, sucralfate  Labs:  Lab Results  Component Value Date   WBC 8.9 10/17/2020   WBC 10.1 10/17/2020   HCT 27.3 (L) 10/17/2020   HCT 25.6 (L) 10/17/2020   HCT 34.1 (L) 11/25/2013   HCT 32.8 (L) 10/19/2013   PLT 461 (H) 10/17/2020   PLT 471 (H) 10/17/2020   PLT 344 11/25/2013   PLT 260 10/19/2013    Lab Results  Component Value Date   INR 1.0 10/02/2020   APTT 29 10/02/2020    Lab Results  Component Value Date   NA 138 10/17/2020   NA 138 10/02/2020   NA 136 11/25/2013   NA 135 (L) 10/19/2013   K 3.2 (L) 10/17/2020   K 3.9 10/02/2020   K 2.8 (L) 06/09/2014   K 2.9 (L) 11/25/2013   BUN 12 10/17/2020   BUN 22 10/02/2020   BUN 11 11/25/2013   BUN 9 10/19/2013    Lab Results  Component Value Date   MG 1.8 09/08/2017   MG 0.9 (L) 10/19/2013   Exam: Dressing with mild saturation around drain 5/5 strength in lower extremities Sensation intact to light touch   Imaging: No issue noted - good position of TLIF cage  A/P:  POD#6 L5/S1 TLIF  Plan:  Pain control - increase baclofen, schedule acetaminophen.    Meade Maw, MD

## 2020-10-18 NOTE — Evaluation (Signed)
Occupational Therapy Evaluation Patient Details Name: Nicole Bass MRN: [id number] DOB: Jun 03, 1951 Today's Date: 10/18/2020    History of Present Illness Pt is a 70 y.o. female presenting to hospital with confusion, SOB, and back pain.  Recent L L5/S1 transforaminal interbody fusion, facetectomy, laminectomy with discectomy (10/12/20).  Pt admitted with pain exacerbation s/p surgery.  PMH includes anemia, anxiety, RA, collagen vascular disease, Crohn's disease, cervical and lumbar DDD, dyspnea, L hand weakness and tremors, HA, h/o claustrophobia, HOH L ear, htn, Meniere's disease, motion sickness, nasal fx, peripheral neuropathy, vertigo, R reverse TSR, s/p R partial knee replacement, back sx x3, c-spine sx, kyphoplasty, B wrist fx sx.   Clinical Impression   Pt seen for OT evaluation this date. Upon arrival to room, pt awake and side-lying in bed. Pt agreeable to OT evaluation and treatment. Prior to most recent admission (Feb 2022), pt was independent with ADLs. Since d/c (10/12/2020), pt's spouse assisted with ADLs d/t back pain. Pt reports using a RW at home for functional mobility since d/c (did not use a walker prior to back surgery). Pt currently requires MIN A for brace mgt, CGA for ADL transfers with RW, and SUPERVISION/SET-UP for seated grooming due to current functional impairments (See OT Problem List below). Pt alert and oriented, however unable to recall spinal precautions and implications for bed/functional mobility; pt re-educated on back precautions and how to maintain during ADL and IADL tasks. Of note, pt expressed concern regarding loss of dentures and became tearful, however dentures appeared to be donned; RN informed. Pt would benefit from additional skilled OT services to maximize return to PLOF and minimize risk of future falls, injury, caregiver burden, and readmission. Upon discharge, recommend HHOT services and supervision/assistance as needed.    Follow Up Recommendations   Home health OT;Supervision - Intermittent    Equipment Recommendations  3 in 1 bedside commode       Precautions / Restrictions Precautions Precautions: Fall;Back Precaution Booklet Issued: Yes (comment) Precaution Comments: [place] Lumbar Brace: may remover when in bed; may ambulate to bathroom without brace; apply/remove brace while sitting; may remove brace to shower Required Braces or Orthoses: Spinal Brace Spinal Brace: Lumbar corset;Applied in sitting position Restrictions Weight Bearing Restrictions: No      Mobility Bed Mobility Overal bed mobility: Needs Assistance Bed Mobility: Rolling;Sidelying to Sit Rolling: Modified independent (Device/Increase time) Sidelying to sit: Modified independent (Device/Increase time);HOB elevated       General bed mobility comments: Required increased time and effort    Transfers Overall transfer level: Needs assistance Equipment used: Rolling walker (2 wheeled) Transfers: Sit to/from Stand Sit to Stand: Min guard          Balance Overall balance assessment: Needs assistance Sitting-balance support: No upper extremity supported;Feet supported Sitting balance-Leahy Scale: Good Sitting balance - Comments: steady sitting while donning brace   Standing balance support: Bilateral upper extremity supported;During functional activity Standing balance-Leahy Scale: Good Standing balance comment: Walking to/from bathroom with RW; no LOB observed                           ADL either performed or assessed with clinical judgement   ADL Overall ADL's : Needs assistance/impaired                                       General ADL Comments: MIN A for brace  mgt, CGA for ADL transfers with RW, and SUPERVISION/SET-UP for seated grooming     Vision Baseline Vision/History: Wears glasses Wears Glasses: At all times              Pertinent Vitals/Pain Pain Assessment: 0-10 Pain Score: 9  Pain Location: low  back Pain Descriptors / Indicators: Aching;Sore Pain Intervention(s): Limited activity within patient's tolerance;Monitored during session;Repositioned;Premedicated before session     Hand Dominance Right   Extremity/Trunk Assessment Upper Extremity Assessment Upper Extremity Assessment: Overall WFL for tasks assessed;Generalized weakness   Lower Extremity Assessment Lower Extremity Assessment: Defer to PT evaluation   Cervical / Trunk Assessment Cervical / Trunk Assessment: Normal   Communication Communication Communication: No difficulties   Cognition Arousal/Alertness: Awake/alert Behavior During Therapy: WFL for tasks assessed/performed Overall Cognitive Status: Within Functional Limits for tasks assessed                                 General Comments: Pt alert and oriented, however pt unable to recall spinal precautions and implications for bed/functional mobility. Pt concerned about losing dentures, becoming tearful, however dentures appeared to be donned.      Exercises Other Exercises Other Exercises: Pt re-educated on back precautions and how to maintain during ADL and IADL tasks, RW mgt for ADL transfers, AE/DME, self care skills, and back brace mgt.        Home Living Family/patient expects to be discharged to:: Private residence Living Arrangements: Spouse/significant other Available Help at Discharge: Family;Available PRN/intermittently (Husband at work throughout the day) Type of Home: House Home Access: Stairs to enter [employer] of Steps: 3 Entrance Stairs-Rails: Can reach both Home Layout: Multi-level;Able to live on main level with bedroom/bathroom     Bathroom Shower/Tub: Occupational psychologist: Handicapped height     Home Equipment: Ruth held shower head;Adaptive equipment Adaptive Equipment: Reacher        Prior Functioning/Environment Level of Independence: Independent        Comments:  Prior to most recent admission, pt was independent with ADLs. Since d/c 2/28, pt's spouse has been assisting with ADLs d/t pain. Pt reports using a RW at home for functional mobility (did not use a walker prior to back surgery).        OT Problem List: Decreased strength;Pain;Decreased range of motion;Impaired balance (sitting and/or standing);Decreased knowledge of use of DME or AE;Decreased knowledge of precautions      OT Treatment/Interventions: Self-care/ADL training;Therapeutic exercise;Therapeutic activities;DME and/or AE instruction;Patient/family education;Balance training    OT Goals(Current goals can be found in the care plan section) Acute Rehab OT Goals Patient Stated Goal: to go home and improve pain OT Goal Formulation: With patient Time For Goal Achievement: 11/01/20 Potential to Achieve Goals: Good ADL Goals Pt Will Transfer to Toilet: with modified independence;ambulating;regular height toilet Pt Will Perform Toileting - Clothing Manipulation and hygiene: with modified independence;sit to/from stand Additional ADL Goal #1: Pt will recall and adhere to 3/3 spinal precautions  OT Frequency: Min 1X/week    AM-PAC OT "6 Clicks" Daily Activity     Outcome Measure Help from another person eating meals?: None Help from another person taking care of personal grooming?: None Help from another person toileting, which includes using toliet, bedpan, or urinal?: A Lot Help from another person bathing (including washing, rinsing, drying)?: A Lot Help from another person to put on and taking off regular upper body clothing?: A  Little Help from another person to put on and taking off regular lower body clothing?: A Lot 6 Click Score: 17   End of Session Equipment Utilized During Treatment: Back brace;Rolling walker Nurse Communication: Mobility status  Activity Tolerance: Patient limited by pain Patient left: in chair;with call bell/phone within reach;with chair alarm set  OT  Visit Diagnosis: Other abnormalities of gait and mobility (R26.89);Muscle weakness (generalized) (M62.81);Pain Pain - Right/Left:  (back)                Time: 5883-2549 OT Time Calculation (min): 28 min Charges:  OT General Charges $OT Visit: 1 Visit OT Evaluation $OT Eval Moderate Complexity: 1 Mod OT Treatments $Self Care/Home Management : 8-22 mins  Fredirick Maudlin, OTR/L [place]

## 2020-10-18 NOTE — Progress Notes (Signed)

## 2020-10-19 LAB — URINE CULTURE: Culture: >=100000 — AB

## 2020-10-19 MED ORDER — NITROFURANTOIN MONOHYD MACRO 100 MG PO CAPS
100.0000 mg | ORAL_CAPSULE | Freq: Two times a day (BID) | ORAL | Status: DC
  Administered 2020-10-19 – 2020-10-20 (×2): 100 mg via ORAL
  Filled 2020-10-19 (×3): qty 1

## 2020-10-19 NOTE — TOC Initial Note (Signed)
Transition of Care [facility]) - Initial/Assessment Note    Patient Details  Name: Nicole Bass MRN: [id number] Date of Birth: 05/15/1951  Transition of Care [facility]) CM/SW Contact:    Pete Pelt, RN Phone Number: 10/19/2020, 11:01 AM  Clinical Narrative:      TOC in room to discuss discharge plan for Home Health with patient.  Introductions made, patient amenable to discussion.  TOC verified with Dr. Rhea Bleacher office that they had set up [facility], this was communicated to patient.  Patient amenable.  No further needs.  TOC signing off.                   Patient Goals and CMS Choice        Expected Discharge Plan and Services                                                Prior Living Arrangements/Services                       Activities of Daily Living Home Assistive Devices/Equipment: Gilford Rile (specify type),Dentures (specify type),Eyeglasses ADL Screening (condition at time of admission) Patient's cognitive ability adequate to safely complete daily activities?: Yes Is the patient deaf or have difficulty hearing?: No Does the patient have difficulty seeing, even when wearing glasses/contacts?: No Does the patient have difficulty concentrating, remembering, or making decisions?: No Patient able to express need for assistance with ADLs?: Yes Does the patient have difficulty dressing or bathing?: Yes Independently performs ADLs?: Yes (appropriate for developmental age) Does the patient have difficulty walking or climbing stairs?: No Weakness of Legs: None Weakness of Arms/Hands: None  Permission Sought/Granted                  Emotional Assessment              Admission diagnosis:  Fusion of lumbar spine [M43.26] Postoperative pain after spinal surgery [G89.18] Patient Active Problem List   Diagnosis Date Noted  . Fusion of lumbar spine 10/17/2020  . Lumbar stenosis 10/12/2020  . Swelling of limb 06/02/2020  . Cervical  spondylosis 05/01/2020  . Atherosclerosis of native coronary artery of native heart without angina pectoris 04/27/2020  . Numbness and tingling in left hand 10/29/2019  . 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 Status post right partial knee replacement 02/12/2019  . Rotator cuff arthropathy, right 01/25/2019  . Iron deficiency anemia 09/13/2018  . Anemia, unspecified 09/09/2018  . Iron deficiency 09/04/2018  . Acute kidney injury ([place]) 08/23/2018  . HTN, goal below 140/80 06/04/2018  . Hyperthyroidism 02/08/2018  . Imbalance 12/18/2017  . Stomatitis, ulcerative 09/06/2017  . Dyspareunia in female 08/22/2017  . Vaginal atrophy 08/22/2017  . Obesity (BMI 30.0-34.9) 08/22/2017  . Varicose veins of leg with pain, left 04/05/2017  . Peripheral polyneuropathy 04/05/2017  . Lumbar radiculopathy 04/05/2017  . Tremor 02/13/2017  . Sensory neuropathy 02/13/2017  . Restless leg syndrome 02/13/2017  . Impingement syndrome of left shoulder 09/29/2016  . Acute diarrhea 09/06/2016  . History of pyelonephritis 09/06/2016  . Hypokalemia 09/06/2016  . 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 Pyelonephritis 08/29/2016  . Rheumatoid arthritis, unspecified ([place]) 08/29/2016  . Nontraumatic complete tear of right rotator cuff 03/08/2016  . Acute pain of right shoulder 01/27/2016  . Type  2 diabetes mellitus with diabetic neuropathy ([place]) 10/27/2015  . Closed fracture of distal end of right radius with routine healing 09/17/2015  . Crohn's colitis ([place]) 09/04/2015  . Acute bilateral low back pain without sciatica 03/23/2015  . SOB (shortness of breath) on exertion 01/15/2015  . Pedal edema 01/15/2015  . Status post open reduction with internal fixation of fracture 11/27/2014  . Aftercare for healing traumatic fracture of lower arm 09/25/2014  . Painful rib 09/11/2014  . Primary osteoarthritis of left knee 04/24/2014  . Osteoporosis 03/17/2014  . Depression  03/17/2014  . B12 deficiency 03/17/2014  . Crohn's disease, unspecified, without complications ([place]) 27/78/2423  . Urolith 07/16/2013  . Right flank pain 07/16/2013  . Abdominal pain 07/16/2013   PCP:  [name], MD Pharmacy:   [facility] [place], [place] - [address] [facility] [address] AT [address] [facility] [place] [address] Phone: [phone] Fax: [place] #[id number] [place], [place] - [address] AT [address] [place] [address] Phone: [phone] Fax: [phone]     Social Determinants of Health (SDOH) Interventions    Readmission Risk Interventions No flowsheet data found.

## 2020-10-19 NOTE — Progress Notes (Signed)
`  Progress Note   Date: 10/19/2020  POD#7 L5/S1 TLIF  24 Hour events: POD7: States pain improved, has been ambulating in halls. Feels she loses her train of thought but is oriented.   Vital Signs: Temp:  [97.5 F (36.4 C)-98.9 F (37.2 C)] 97.5 F (36.4 C) (03/07 0740) Pulse Rate:  [87-104] 87 (03/07 0740) Resp:  [15-20] 16 (03/07 0740) BP: (108-128)/(49-73) 108/73 (03/07 0740) SpO2:  [93 %-100 %] 97 % (03/07 0740) Temp (24hrs), Avg:98.2 F (36.8 C), Min:97.5 F (36.4 C), Max:98.9 F (37.2 C)  Weight: 79.4 kg   Problem List Patient Active Problem List   Diagnosis Date Noted   Fusion of lumbar spine 10/17/2020   Lumbar stenosis 10/12/2020   Swelling of limb 06/02/2020   Cervical spondylosis 05/01/2020   Atherosclerosis of native coronary artery of native heart without angina pectoris 04/27/2020   Numbness and tingling in left hand 10/29/2019   Status post reverse total shoulder replacement, 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   Status post right partial knee replacement 02/12/2019   Rotator cuff arthropathy, right 01/25/2019   Iron deficiency anemia 09/13/2018   Anemia, unspecified 09/09/2018   Iron deficiency 09/04/2018   Acute kidney injury ([place]) 08/23/2018   HTN, goal below 140/80 06/04/2018   Hyperthyroidism 02/08/2018   Imbalance 12/18/2017   Stomatitis, ulcerative 09/06/2017   Dyspareunia in female 08/22/2017   Vaginal atrophy 08/22/2017   Obesity (BMI 30.0-34.9) 08/22/2017   Varicose veins of leg with pain, left 04/05/2017   Peripheral polyneuropathy 04/05/2017   Lumbar radiculopathy 04/05/2017   Tremor 02/13/2017   Sensory neuropathy 02/13/2017   Restless leg syndrome 02/13/2017   Impingement syndrome of left shoulder 09/29/2016   Acute diarrhea 09/06/2016   History of pyelonephritis 09/06/2016   Hypokalemia 09/06/2016   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   Pyelonephritis 08/29/2016   Rheumatoid arthritis, unspecified  ([place]) 08/29/2016   Nontraumatic complete tear of right rotator cuff 03/08/2016   Acute pain of right shoulder 01/27/2016   Type 2 diabetes mellitus with diabetic neuropathy ([place]) 10/27/2015   Closed fracture of distal end of right radius with routine healing 09/17/2015   Crohn's colitis ([place]) 09/04/2015   Acute bilateral low back pain without sciatica 03/23/2015   SOB (shortness of breath) on exertion 01/15/2015   Pedal edema 01/15/2015   Status post open reduction with internal fixation of fracture 11/27/2014   Aftercare for healing traumatic fracture of lower arm 09/25/2014   Painful rib 09/11/2014   Primary osteoarthritis of left knee 04/24/2014   Osteoporosis 03/17/2014   Depression 03/17/2014   B12 deficiency 03/17/2014   Crohn's disease, unspecified, without complications ([place]) 28/76/8115   Urolith 07/16/2013   Right flank pain 07/16/2013   Abdominal pain 07/16/2013    Medications: Scheduled Meds:  acetaminophen  1,000 mg Oral Q8H   acidophilus  1 capsule Oral Daily   baclofen  10 mg Oral Q6H   cholecalciferol  1,000 Units Oral Daily   cyanocobalamin  1,000 mcg Intramuscular Q30 days   diphenoxylate-atropine  1 tablet Oral Daily   docusate sodium  100 mg Oral BID   enoxaparin (LOVENOX) injection  40 mg Subcutaneous Q24H   fluconazole  400 mg Oral Daily   furosemide  20 mg Oral Daily   gabapentin  100 mg Oral BH-q7a   gabapentin  400 mg Oral QHS   irbesartan  75 mg Oral Daily   loratadine  10 mg Oral Daily   magnesium oxide  400 mg Oral Q M,W,F  meloxicam  15 mg Oral Daily   multivitamin with minerals  1 tablet Oral Daily   pantoprazole  40 mg Oral Daily   potassium chloride SA  20 mEq Oral Q M,W,F   predniSONE  3 mg Oral Q breakfast   senna  1 tablet Oral BID   sodium chloride flush  3 mL Intravenous Q12H   Continuous Infusions:  sodium chloride     sodium chloride 125 mL/hr at 10/17/20 1554   PRN Meds:.betamethasone  dipropionate, bisacodyl, diazepam, HYDROcodone-acetaminophen, HYDROmorphone (DILAUDID) injection, lidocaine, menthol-cetylpyridinium **OR** phenol, ondansetron **OR** ondansetron (ZOFRAN) IV, polyethylene glycol, rOPINIRole, sodium chloride flush, sucralfate  Labs:  Lab Results  Component Value Date   WBC 8.9 10/17/2020   WBC 10.1 10/17/2020   HCT 27.3 (L) 10/17/2020   HCT 25.6 (L) 10/17/2020   HCT 34.1 (L) 11/25/2013   HCT 32.8 (L) 10/19/2013   PLT 461 (H) 10/17/2020   PLT 471 (H) 10/17/2020   PLT 344 11/25/2013   PLT 260 10/19/2013    Lab Results  Component Value Date   INR 1.0 10/02/2020   APTT 29 10/02/2020    Lab Results  Component Value Date   NA 138 10/17/2020   NA 138 10/02/2020   NA 136 11/25/2013   NA 135 (L) 10/19/2013   K 3.2 (L) 10/17/2020   K 3.9 10/02/2020   K 2.8 (L) 06/09/2014   K 2.9 (L) 11/25/2013   BUN 12 10/17/2020   BUN 22 10/02/2020   BUN 11 11/25/2013   BUN 9 10/19/2013    Lab Results  Component Value Date   MG 1.8 09/08/2017   MG 0.9 (L) 10/19/2013   Exam: 5/5 strength in lower extremities Sensation intact to light touch   Imaging: No issue noted - good position of TLIF cage  A/P:  POD#7 L5/S1 TLIF  Plan: PT cleared for home Pain control - increased baclofen, schedule acetaminophen. Appears adequate Dispo planning: will need to discuss if patient needs additional care than can be provided at home   Deetta Perla, MD

## 2020-10-19 NOTE — Plan of Care (Signed)

## 2020-10-19 NOTE — Progress Notes (Signed)
Physical Therapy Treatment Patient Details Name: Nicole Bass MRN: [id number] DOB: 17-May-1951 Today's Date: 10/19/2020    History of Present Illness Pt is a 70 y.o. female presenting to hospital with confusion, SOB, and back pain.  Recent L L5/S1 transforaminal interbody fusion, facetectomy, laminectomy with discectomy (10/12/20).  Pt admitted with pain exacerbation s/p surgery.  PMH includes anemia, anxiety, RA, collagen vascular disease, Crohn's disease, cervical and lumbar DDD, dyspnea, L hand weakness and tremors, HA, h/o claustrophobia, HOH L ear, htn, Meniere's disease, motion sickness, nasal fx, peripheral neuropathy, vertigo, R reverse TSR, s/p R partial knee replacement, back sx x3, c-spine sx, kyphoplasty, B wrist fx sx.    PT Comments    Pt sitting on edge of bed upon PT arrival; pt able to donn back brace with a little extra time on her own.  A&O x4.  SBA with transfers and ambulation (120 feet and then 140 feet with RW); and CGA navigating 4 steps with B railings.  Pt steady and safe throughout PT session; no loss of balance during sessions activities.  LBP 4/10 beginning of session but decreased to 0/10 end of session.  Pt progressing well with functional mobility.  Will continue to focus on strengthening, balance, and progressive functional mobility during hospitalization.    Follow Up Recommendations  Home health PT     Equipment Recommendations  None recommended by PT (pt has RW at home already)    Recommendations for Other Services OT consult     Precautions / Restrictions Precautions Precautions: Fall;Back Precaution Booklet Issued: Yes (comment) Precaution Comments: [place] Lumbar Brace: may remover when in bed; may ambulate to bathroom without brace; apply/remove brace while sitting; may remove brace to shower Required Braces or Orthoses: Spinal Brace Spinal Brace: Lumbar corset;Applied in sitting position Restrictions Weight Bearing Restrictions: No     Mobility  Bed Mobility               General bed mobility comments: Deferred (pt sitting up on edge of bed upon PT arrival; no brace on)    Transfers Overall transfer level: Needs assistance Equipment used: Rolling walker (2 wheeled) Transfers: Sit to/from Stand Sit to Stand: Supervision         General transfer comment: fairly strong stand from bed and mat table up to RW; controlled descent sitting down  Ambulation/Gait Ambulation/Gait assistance: Supervision Gait Distance (Feet):  (120 feet; 140 feet) Assistive device: Rolling walker (2 wheeled)   Gait velocity: mildly decreased   General Gait Details: step through steady gait with RW   Stairs Stairs: Yes Stairs assistance: Min guard Stair Management: Two rails;Forwards Number of Stairs: 4 General stair comments: alternating pattern ascending and step to pattern descending; steady and safe   Wheelchair Mobility    Modified Rankin (Stroke Patients Only)       Balance Overall balance assessment: Needs assistance Sitting-balance support: No upper extremity supported;Feet supported Sitting balance-Leahy Scale: Good Sitting balance - Comments: steady sitting reaching within BOS   Standing balance support: No upper extremity supported Standing balance-Leahy Scale: Good Standing balance comment: steady standing reaching within BOS                            Cognition Arousal/Alertness: Awake/alert Behavior During Therapy: WFL for tasks assessed/performed Overall Cognitive Status: Within Functional Limits for tasks assessed  Exercises  Reviewed spinal precautions with pt (pt verbalizing understanding); occasional cues for not twisting when turning to look during session.    General Comments   Nursing cleared pt for participation in physical therapy.  Pt agreeable to PT session.      Pertinent Vitals/Pain Pain Assessment: 0-10 Pain  Score: 0-No pain Pain Location: low back Pain Intervention(s): Limited activity within patient's tolerance;Monitored during session;Repositioned;Premedicated before session  O2 sats on room air stable and WFL throughout treatment session. HR 87-107 bpm during sessions activities.    Home Living                      Prior Function            PT Goals (current goals can now be found in the care plan section) Acute Rehab PT Goals Patient Stated Goal: to go home and improve pain PT Goal Formulation: With patient Time For Goal Achievement: 11/01/20 Potential to Achieve Goals: Good Progress towards PT goals: Progressing toward goals    Frequency    7X/week      PT Plan Current plan remains appropriate    Co-evaluation              AM-PAC PT "6 Clicks" Mobility   Outcome Measure  Help needed turning from your back to your side while in a flat bed without using bedrails?: None Help needed moving from lying on your back to sitting on the side of a flat bed without using bedrails?: None Help needed moving to and from a bed to a chair (including a wheelchair)?: A Little Help needed standing up from a chair using your arms (e.g., wheelchair or bedside chair)?: A Little Help needed to walk in hospital room?: A Little Help needed climbing 3-5 steps with a railing? : A Little 6 Click Score: 20    End of Session Equipment Utilized During Treatment: Gait belt;Back brace Activity Tolerance: Patient tolerated treatment well Patient left: in chair;with call bell/phone within reach;with chair alarm set Nurse Communication: Mobility status;Precautions;Other (comment) (pt's pain status) PT Visit Diagnosis: Muscle weakness (generalized) (M62.81);Difficulty in walking, not elsewhere classified (R26.2);Pain Pain - Right/Left: Left Pain - part of body: Hip     Time: 3845-3646 PT Time Calculation (min) (ACUTE ONLY): 24 min  Charges:  $Gait Training: 8-22 mins $Therapeutic  Activity: 8-22 mins                    Leitha Bleak, PT 10/19/20, 9:33 AM

## 2020-10-20 MED ORDER — HYDROCODONE-ACETAMINOPHEN 10-325 MG PO TABS: 1.0000 | ORAL_TABLET | Freq: Four times a day (QID) | ORAL | 0 refills | Status: DC | PRN

## 2020-10-20 MED ORDER — BACLOFEN 10 MG PO TABS: 10.0000 mg | ORAL_TABLET | Freq: Three times a day (TID) | ORAL | 0 refills | Status: DC

## 2020-10-20 MED ORDER — NITROFURANTOIN MONOHYD MACRO 100 MG PO CAPS: 100.0000 mg | ORAL_CAPSULE | Freq: Two times a day (BID) | ORAL | 0 refills | Status: DC

## 2020-10-20 NOTE — Progress Notes (Incomplete)
Discharge instructions given. Patient verbalized understanding and all questions were answered.  ?

## 2020-10-20 NOTE — Discharge Instructions (Signed)
NEUROSURGERY DISCHARGE INSTRUCTIONS  Admission diagnosis: Fusion of lumbar spine [M43.26] Postoperative pain after spinal surgery [G89.18]  Operative procedure: L5/S1 TLIF  What to do after you leave the hospital:  Recommended diet: regular diet. Increase protein intake to promote wound healing.  Recommended activity: no lifting or strenuous exercise for 6 weeks. You should walk multiple times per day  Special Instructions  No straining, no heavy lifting > 10lbs x 6 weeks.  Keep incision area clean and dry. May shower now. No baths or pools for 5 weeks.  No need to apply a bandage   You have no sutures to remove, the skin is closed with adhesive  Please take pain medications as directed. Take a stool softener if on pain medications   Please Report any of the following: Nausea or Vomiting, Temperature is greater than 101.58F (38.1C) degrees, Dizziness, Abdominal Pain, Difficulty Breathing or Shortness of Breath, Inability to Eat, drink Fluids, or Take medications, Bleeding, swelling, or drainage from surgical incision sites, New numbness or weakness, and Bowel or bladder dysfunction to the neurosurgeon on call at [phone]  Additional Follow up appointments Please follow up with Dr Lacinda Axon in [place] [facility] as scheduled in 1-2 weeks   Please see below for scheduled appointments:  Future Appointments  Date Time Provider [place]  12/28/2020  1:30 PM Lin Landsman, MD [facility] None  06/16/2021 11:15 AM Hollice Espy, MD [facility] None

## 2020-10-20 NOTE — Discharge Summary (Signed)
Physician Discharge Summary  Patient ID: Nicole Bass MRN: [id number] DOB/AGE: November 10, 1950 70 y.o.  Admit date: 10/17/2020 Discharge date: 10/20/2020  Admission Diagnoses: Fusion of lumbar spine  Discharge Diagnoses:  Active Problems:   Fusion of lumbar spine   Discharged Condition: good  Hospital Course: Nicole Bass was readmitted to the hospital on 3/5 for increased pain and confusion.  She had undergone the L5-S1 TLIF on 2/28.  She had been discharged home with adequate pain control on oral medications but over the past weekend she developed mild disorientation.  In the emergency department, laboratory values were unremarkable and there was concern for narcotic use causing confusion.  She was admitted to the floor for observation and started on baclofen and an NSAID.  She was able to work with physical therapy and her pain was under better control on 3/7.  She was tolerating a diet.  Her disorientation improved.  She was noted on urine culture to have a urinary tract infection and started on antibiotic.  She was seen to be at her neurological baseline on 3/8.  Her incisions are healing appropriately.  She is ready for discharge.  Consults: None   Discharge Exam: Blood pressure 132/67, pulse (!) 101, temperature 98 F (36.7 C), resp. rate 18, weight 79.4 kg, SpO2 99 %. Awake, alert 5/5 in bilateral lower extremities Sensation intact to light touch Incisions flat, healing appropriately   Disposition: Discharge disposition: 01-Home or Self Care       Discharge Instructions    Diet - low sodium heart healthy   Complete by: As directed    Incentive spirometry RT   Complete by: As directed    Increase activity slowly   Complete by: As directed    No dressing needed   Complete by: As directed      Allergies as of 10/20/2020      Reactions   Ciprofloxacin Itching, Rash   Levofloxacin Hives, Rash   Methotrexate    Other reaction(s): Other (See Comments) Mouth  Ulcers/[place] Mouth and throat ulcers   Methotrexate Derivatives    Mouth and throat ulcers   Other Other (See Comments), Rash   Other reaction(s): Other (See Comments), Unknown Per pts MD she is unable to take this medication because it is contraindicated with her other medications. Per pts MD she is unable to take this medication because it is contraindicated with her other medications.  Other reaction(s): Other (See Comments), Unknown Other reaction(s): Other (See Comments), Unknown Per pts MD she is unable to take this medication because it is contraindicated with her other medications. Per pts MD she is unable to take this medication because it is contraindicated with her other medications. Other reaction(s): Other (See Comments), Unknown Other reaction(s): Other (See Comments), Unknown Per pts MD she is unable to take this medication because it is contraindicated with her  Per pts MD she is unable to take this medication because it is contraindicated with her other medications. Other reaction(s): Other (See Comments) Other reaction(s): Other (See Comments), Unknown Per pts MD she is unable to take this medication because it is contraindicated with her other medications. Per pts MD she is unable to take this medication because it is contraindicated with her other medications.  Other reaction(s): Other (See Comments), Unknown Other reaction(s): Other (See Comments), Unknown Per pts MD she is unable to take this medication because it is contraindicated with her other medications. Per pts MD she is unable to take this medication because it is  contraindicated with her other medications. Other reaction(s): Other (See Comments), Unknown Other reaction(s): Other (See Comments), Unknown Per pts MD she is unable to take this medication because it is contraindicated with her  Per pts MD she is unable to take this medication because it is contraindicated with her other  medications.   Sulfa Antibiotics Other (See Comments)   Per pts MD she is unable to take this medication because it is contraindicated with her other medications.   Trelegy Ellipta [fluticasone-umeclidin-vilant]    Laryngitis    Cefazolin Itching, Rash   Cefprozil Rash   Cephalosporins Itching, Rash   Enbrel [etanercept] Itching, Rash   Humira [adalimumab] Itching, Rash   Hydrocodone-acetaminophen Itching, Other (See Comments)   Itching only   Lorabid [loracarbef] Itching, Rash   Meropenem Itching, Rash   Oxycodone Itching, Other (See Comments), Rash   Other reaction(s): Other (See Comments)   Oxycodone-acetaminophen Itching   Primidone Itching, Rash      Medication List    STOP taking these medications   morphine 15 MG tablet Commonly known as: MSIR   traMADol 50 MG tablet Commonly known as: ULTRAM     TAKE these medications   acetaminophen 650 MG CR tablet Commonly known as: TYLENOL Take 1,300 mg by mouth every 8 (eight) hours as needed for pain.   alendronate 70 MG tablet Commonly known as: FOSAMAX Take 70 mg by mouth every Monday. Take with a full glass of water on an empty stomach.   aspirin EC 81 MG tablet Take 81 mg by mouth daily. Swallow whole.   baclofen 10 MG tablet Commonly known as: LIORESAL Take 1 tablet (10 mg total) by mouth every 8 (eight) hours. What changed:   medication strength  how much to take  when to take this   betamethasone dipropionate 0.05 % lotion Apply topically 2 (two) times daily as needed (Rash). Apply thin coat to affected areas twice daily for 2 weeks   cetirizine 10 MG tablet Commonly known as: ZYRTEC Take 10 mg by mouth daily.   chlorhexidine 0.12 % solution Commonly known as: PERIDEX Use as directed 15 mLs in the mouth or throat as needed.   cholecalciferol 25 MCG (1000 UNIT) tablet Commonly known as: VITAMIN D3 Take 1,000 Units by mouth daily.   cyanocobalamin 1000 MCG/ML injection Commonly known as:  (VITAMIN B-12) Inject 1,000 mcg into the muscle every 30 (thirty) days.   diazepam 10 MG tablet Commonly known as: Valium Take 1 tablet (10 mg total) by mouth daily as needed (vaginal pain with intercourse, use before sexual activity per vagina). What changed: when to take this   diphenoxylate-atropine 2.5-0.025 MG tablet Commonly known as: LOMOTIL Take 1 tablet by mouth daily. am   fluconazole 200 MG tablet Commonly known as: DIFLUCAN Take 400 mg by mouth daily.   furosemide 20 MG tablet Commonly known as: LASIX Take 20 mg by mouth daily. am   gabapentin 400 MG capsule Commonly known as: NEURONTIN Take 400 mg by mouth at bedtime.   gabapentin 100 MG capsule Commonly known as: NEURONTIN Take 100 mg by mouth every morning.   HYDROcodone-acetaminophen 10-325 MG tablet Commonly known as: NORCO Take 1 tablet by mouth every 6 (six) hours as needed for severe pain.   lidocaine 2 % solution Commonly known as: XYLOCAINE Use as directed 15 mLs in the mouth or throat as needed for mouth pain.   magnesium oxide 400 MG tablet Commonly known as: MAG-OX Take 400 mg by mouth  every Monday, Wednesday, and Friday. 3 times per week   multivitamin with minerals Tabs tablet Take 1 tablet by mouth daily.   nitrofurantoin (macrocrystal-monohydrate) 100 MG capsule Commonly known as: MACROBID Take 1 capsule (100 mg total) by mouth every 12 (twelve) hours.   omeprazole 40 MG capsule Commonly known as: PRILOSEC Take 40 mg by mouth daily before breakfast.   potassium chloride SA 20 MEQ tablet Commonly known as: KLOR-CON Take 20 mEq by mouth every Monday, Wednesday, and Friday.   predniSONE 1 MG tablet Commonly known as: DELTASONE Take 3 mg by mouth daily with breakfast.   PROBIOTIC PO Take 1 capsule by mouth daily.   rOPINIRole 1 MG tablet Commonly known as: REQUIP Take 1 mg by mouth at bedtime as needed (for restless legs).   sucralfate 1 g tablet Commonly known as:  CARAFATE Take 0.5 g by mouth 3 (three) times daily with meals as needed (for stomach cramping (crohn's disease)).   telmisartan 20 MG tablet Commonly known as: MICARDIS Take 20 mg by mouth every Monday, Wednesday, and Friday.            Discharge Care Instructions  (From admission, onward)         Start     Ordered   10/20/20 0000  No dressing needed        10/20/20 0732           Signed: Deetta Perla 10/20/2020, 7:34 AM

## 2020-10-20 NOTE — Care Management Important Message (Signed)
Important Message  Patient Details  Name: CAPRISHA BRIDGETT MRN: [id number] Date of Birth: [birth year]   Medicare Important Message Given:  Yes     Juliann Pulse A Hughey Rittenberry 10/20/2020, 11:38 AM

## 2020-10-22 LAB — CULTURE, BLOOD (ROUTINE X 2)
Culture: NO GROWTH
Culture: NO GROWTH
Special Requests: ADEQUATE

## 2020-10-25 DIAGNOSIS — B459 Cryptococcosis, unspecified: Secondary | ICD-10-CM | POA: Insufficient documentation

## 2020-10-25 DIAGNOSIS — B450 Pulmonary cryptococcosis: Secondary | ICD-10-CM | POA: Insufficient documentation

## 2020-11-09 ENCOUNTER — Encounter: Payer: Self-pay | Admitting: Emergency Medicine

## 2020-11-09 ENCOUNTER — Observation Stay: Payer: Medicare Other

## 2020-11-09 ENCOUNTER — Emergency Department: Payer: Medicare Other

## 2020-11-09 ENCOUNTER — Observation Stay: Admit: 2020-11-09 | Payer: Medicare Other

## 2020-11-09 ENCOUNTER — Other Ambulatory Visit: Payer: Self-pay

## 2020-11-09 ENCOUNTER — Inpatient Hospital Stay
Admission: EM | Admit: 2020-11-09 | Discharge: 2020-11-12 | DRG: 312 | Disposition: A | Discharge status: Home / Self Care | Payer: Medicare Other | Attending: Family Medicine | Admitting: Family Medicine

## 2020-11-09 DIAGNOSIS — K50119 Crohn's disease of large intestine with unspecified complications: Secondary | ICD-10-CM | POA: Diagnosis not present

## 2020-11-09 DIAGNOSIS — B450 Pulmonary cryptococcosis: Secondary | ICD-10-CM | POA: Diagnosis present

## 2020-11-09 DIAGNOSIS — M069 Rheumatoid arthritis, unspecified: Secondary | ICD-10-CM

## 2020-11-09 DIAGNOSIS — F418 Other specified anxiety disorders: Secondary | ICD-10-CM | POA: Diagnosis present

## 2020-11-09 DIAGNOSIS — D649 Anemia, unspecified: Secondary | ICD-10-CM

## 2020-11-09 DIAGNOSIS — K5010 Crohn's disease of large intestine without complications: Secondary | ICD-10-CM | POA: Diagnosis present

## 2020-11-09 DIAGNOSIS — Z7952 Long term (current) use of systemic steroids: Secondary | ICD-10-CM

## 2020-11-09 DIAGNOSIS — E1142 Type 2 diabetes mellitus with diabetic polyneuropathy: Secondary | ICD-10-CM | POA: Diagnosis present

## 2020-11-09 DIAGNOSIS — Z888 Allergy status to other drugs, medicaments and biological substances status: Secondary | ICD-10-CM

## 2020-11-09 DIAGNOSIS — Z79899 Other long term (current) drug therapy: Secondary | ICD-10-CM

## 2020-11-09 DIAGNOSIS — Z825 Family history of asthma and other chronic lower respiratory diseases: Secondary | ICD-10-CM

## 2020-11-09 DIAGNOSIS — E875 Hyperkalemia: Secondary | ICD-10-CM | POA: Diagnosis present

## 2020-11-09 DIAGNOSIS — F419 Anxiety disorder, unspecified: Secondary | ICD-10-CM | POA: Diagnosis present

## 2020-11-09 DIAGNOSIS — R112 Nausea with vomiting, unspecified: Secondary | ICD-10-CM

## 2020-11-09 DIAGNOSIS — Z833 Family history of diabetes mellitus: Secondary | ICD-10-CM

## 2020-11-09 DIAGNOSIS — I110 Hypertensive heart disease with heart failure: Secondary | ICD-10-CM | POA: Diagnosis present

## 2020-11-09 DIAGNOSIS — I2510 Atherosclerotic heart disease of native coronary artery without angina pectoris: Secondary | ICD-10-CM | POA: Diagnosis present

## 2020-11-09 DIAGNOSIS — E876 Hypokalemia: Secondary | ICD-10-CM

## 2020-11-09 DIAGNOSIS — Z981 Arthrodesis status: Secondary | ICD-10-CM

## 2020-11-09 DIAGNOSIS — Z20822 Contact with and (suspected) exposure to covid-19: Secondary | ICD-10-CM | POA: Diagnosis present

## 2020-11-09 DIAGNOSIS — K219 Gastro-esophageal reflux disease without esophagitis: Secondary | ICD-10-CM | POA: Diagnosis present

## 2020-11-09 DIAGNOSIS — E860 Dehydration: Secondary | ICD-10-CM | POA: Diagnosis present

## 2020-11-09 DIAGNOSIS — Z7982 Long term (current) use of aspirin: Secondary | ICD-10-CM

## 2020-11-09 DIAGNOSIS — R55 Syncope and collapse: Principal | ICD-10-CM

## 2020-11-09 DIAGNOSIS — Z96611 Presence of right artificial shoulder joint: Secondary | ICD-10-CM | POA: Diagnosis present

## 2020-11-09 DIAGNOSIS — Z96651 Presence of right artificial knee joint: Secondary | ICD-10-CM | POA: Diagnosis present

## 2020-11-09 DIAGNOSIS — I5032 Chronic diastolic (congestive) heart failure: Secondary | ICD-10-CM | POA: Diagnosis not present

## 2020-11-09 DIAGNOSIS — R7989 Other specified abnormal findings of blood chemistry: Secondary | ICD-10-CM

## 2020-11-09 DIAGNOSIS — F32A Depression, unspecified: Secondary | ICD-10-CM | POA: Diagnosis present

## 2020-11-09 DIAGNOSIS — Z792 Long term (current) use of antibiotics: Secondary | ICD-10-CM

## 2020-11-09 DIAGNOSIS — F40240 Claustrophobia: Secondary | ICD-10-CM | POA: Diagnosis present

## 2020-11-09 DIAGNOSIS — I10 Essential (primary) hypertension: Secondary | ICD-10-CM

## 2020-11-09 DIAGNOSIS — Z882 Allergy status to sulfonamides status: Secondary | ICD-10-CM

## 2020-11-09 DIAGNOSIS — J189 Pneumonia, unspecified organism: Secondary | ICD-10-CM | POA: Diagnosis present

## 2020-11-09 DIAGNOSIS — E039 Hypothyroidism, unspecified: Secondary | ICD-10-CM | POA: Diagnosis present

## 2020-11-09 LAB — URINALYSIS, COMPLETE (UACMP) WITH MICROSCOPIC
Bilirubin Urine: NEGATIVE
Glucose, UA: NEGATIVE mg/dL
Hgb urine dipstick: NEGATIVE
Ketones, ur: NEGATIVE mg/dL
Leukocytes,Ua: NEGATIVE
Nitrite: NEGATIVE
Protein, ur: NEGATIVE mg/dL
Specific Gravity, Urine: 1.013 (ref 1.005–1.030)
pH: 7 (ref 5.0–8.0)

## 2020-11-09 LAB — BASIC METABOLIC PANEL
Anion gap: 11 (ref 5–15)
BUN: 12 mg/dL (ref 8–23)
CO2: 26 mmol/L (ref 22–32)
Calcium: 8.8 mg/dL — ABNORMAL LOW (ref 8.9–10.3)
Chloride: 103 mmol/L (ref 98–111)
Creatinine, Ser: 0.72 mg/dL (ref 0.44–1.00)
GFR, Estimated: >60 mL/min (ref >60)
Glucose, Bld: 125 mg/dL — ABNORMAL HIGH (ref 70–99)
Potassium: 2.5 mmol/L — CL (ref 3.5–5.1)
Sodium: 140 mmol/L (ref 135–145)

## 2020-11-09 LAB — CBC
HCT: 31.3 % — ABNORMAL LOW (ref 36.0–46.0)
Hemoglobin: 10 g/dL — ABNORMAL LOW (ref 12.0–15.0)
MCH: 30.4 pg (ref 26.0–34.0)
MCHC: 31.9 g/dL (ref 30.0–36.0)
MCV: 95.1 fL (ref 80.0–100.0)
Platelets: 538 10*3/uL — ABNORMAL HIGH (ref 150–400)
RBC: 3.29 MIL/uL — ABNORMAL LOW (ref 3.87–5.11)
RDW: 15 % (ref 11.5–15.5)
WBC: 10.5 10*3/uL (ref 4.0–10.5)
nRBC: 0.2 % (ref 0.0–0.2)

## 2020-11-09 LAB — MAGNESIUM: Magnesium: 1.6 mg/dL — ABNORMAL LOW (ref 1.7–2.4)

## 2020-11-09 LAB — HEPATIC FUNCTION PANEL
ALT: 18 U/L (ref 0–44)
AST: 54 U/L — ABNORMAL HIGH (ref 15–41)
Albumin: 3.2 g/dL — ABNORMAL LOW (ref 3.5–5.0)
Alkaline Phosphatase: 95 U/L (ref 38–126)
Bilirubin, Direct: 0.1 mg/dL (ref 0.0–0.2)
Indirect Bilirubin: 0.3 mg/dL (ref 0.3–0.9)
Total Bilirubin: 0.4 mg/dL (ref 0.3–1.2)
Total Protein: 6.9 g/dL (ref 6.5–8.1)

## 2020-11-09 LAB — TROPONIN I (HIGH SENSITIVITY)
Troponin I (High Sensitivity): 11 ng/L (ref <18)
Troponin I (High Sensitivity): 9 ng/L (ref <18)

## 2020-11-09 LAB — BRAIN NATRIURETIC PEPTIDE: B Natriuretic Peptide: 94 pg/mL (ref 0.0–100.0)

## 2020-11-09 LAB — D-DIMER, QUANTITATIVE: D-Dimer, Quant: 3.46 ug/mL-FEU — ABNORMAL HIGH (ref 0.00–0.50)

## 2020-11-09 LAB — POTASSIUM: Potassium: 2.8 mmol/L — ABNORMAL LOW (ref 3.5–5.1)

## 2020-11-09 MED ORDER — ACETAMINOPHEN 325 MG PO TABS: 650.0000 mg | ORAL_TABLET | Freq: Four times a day (QID) | ORAL | Status: DC | PRN

## 2020-11-09 MED ORDER — TOFACITINIB CITRATE 5 MG PO TABS
5.0000 mg | ORAL_TABLET | Freq: Two times a day (BID) | ORAL | Status: DC
  Administered 2020-11-10 – 2020-11-12 (×5): 5 mg via ORAL
  Filled 2020-11-09 (×10): qty 1

## 2020-11-09 MED ORDER — LACTATED RINGERS IV SOLN: INTRAVENOUS | Status: DC

## 2020-11-09 MED ORDER — PREDNISONE 1 MG PO TABS
3.0000 mg | ORAL_TABLET | Freq: Every day | ORAL | Status: DC
  Administered 2020-11-10 – 2020-11-12 (×3): 3 mg via ORAL
  Filled 2020-11-09 (×3): qty 3

## 2020-11-09 MED ORDER — PROBIOTIC 250 MG PO CAPS: 1.0000 | ORAL_CAPSULE | Freq: Every day | ORAL | Status: DC

## 2020-11-09 MED ORDER — ONDANSETRON HCL 4 MG/2ML IJ SOLN: 4.0000 mg | Freq: Three times a day (TID) | INTRAMUSCULAR | Status: DC | PRN

## 2020-11-09 MED ORDER — VITAMIN D 25 MCG (1000 UNIT) PO TABS
1000.0000 [IU] | ORAL_TABLET | Freq: Every day | ORAL | Status: DC
  Administered 2020-11-09 – 2020-11-12 (×4): 1000 [IU] via ORAL
  Filled 2020-11-09 (×4): qty 1

## 2020-11-09 MED ORDER — MAGNESIUM OXIDE 400 MG PO TABS
400.0000 mg | ORAL_TABLET | ORAL | Status: DC
  Administered 2020-11-11: 15:00:00 400 mg via ORAL
  Filled 2020-11-09 (×3): qty 1

## 2020-11-09 MED ORDER — HYDRALAZINE HCL 20 MG/ML IJ SOLN: 5.0000 mg | INTRAMUSCULAR | Status: DC | PRN

## 2020-11-09 MED ORDER — IOHEXOL 350 MG/ML SOLN
75.0000 mL | Freq: Once | INTRAVENOUS | Status: AC | PRN
  Administered 2020-11-09: 75 mL via INTRAVENOUS

## 2020-11-09 MED ORDER — ENOXAPARIN SODIUM 40 MG/0.4ML ~~LOC~~ SOLN
40.0000 mg | SUBCUTANEOUS | Status: DC
  Administered 2020-11-09 – 2020-11-11 (×3): 40 mg via SUBCUTANEOUS
  Filled 2020-11-09 (×3): qty 0.4

## 2020-11-09 MED ORDER — ONDANSETRON HCL 4 MG/2ML IJ SOLN
4.0000 mg | Freq: Once | INTRAMUSCULAR | Status: AC
  Administered 2020-11-09: 4 mg via INTRAVENOUS
  Filled 2020-11-09: qty 2

## 2020-11-09 MED ORDER — MAGNESIUM SULFATE 2 GM/50ML IV SOLN
2.0000 g | Freq: Once | INTRAVENOUS | Status: AC
  Administered 2020-11-09: 2 g via INTRAVENOUS
  Filled 2020-11-09: qty 50

## 2020-11-09 MED ORDER — GABAPENTIN 100 MG PO CAPS
100.0000 mg | ORAL_CAPSULE | ORAL | Status: DC
  Administered 2020-11-10 – 2020-11-12 (×3): 100 mg via ORAL
  Filled 2020-11-09 (×3): qty 1

## 2020-11-09 MED ORDER — HYDROCORTISONE NA SUCCINATE PF 100 MG IJ SOLR
50.0000 mg | Freq: Once | INTRAMUSCULAR | Status: AC
  Administered 2020-11-09: 21:00:00 50 mg via INTRAVENOUS
  Filled 2020-11-09: qty 1

## 2020-11-09 MED ORDER — HYDROCODONE-ACETAMINOPHEN 10-325 MG PO TABS
1.0000 | ORAL_TABLET | Freq: Four times a day (QID) | ORAL | Status: DC | PRN
  Administered 2020-11-09 – 2020-11-12 (×6): 1 via ORAL
  Filled 2020-11-09 (×6): qty 1

## 2020-11-09 MED ORDER — SUCRALFATE 1 G PO TABS: 0.5000 g | ORAL_TABLET | Freq: Three times a day (TID) | ORAL | Status: DC | PRN

## 2020-11-09 MED ORDER — GABAPENTIN 300 MG PO CAPS
400.0000 mg | ORAL_CAPSULE | Freq: Every day | ORAL | Status: DC
  Administered 2020-11-09 – 2020-11-11 (×3): 400 mg via ORAL
  Filled 2020-11-09 (×3): qty 1

## 2020-11-09 MED ORDER — LORATADINE 10 MG PO TABS
10.0000 mg | ORAL_TABLET | Freq: Every day | ORAL | Status: DC
  Administered 2020-11-09 – 2020-11-12 (×4): 10 mg via ORAL
  Filled 2020-11-09 (×4): qty 1

## 2020-11-09 MED ORDER — DIAZEPAM 5 MG PO TABS
5.0000 mg | ORAL_TABLET | Freq: Once | ORAL | Status: AC
  Administered 2020-11-09: 5 mg via ORAL
  Filled 2020-11-09: qty 1

## 2020-11-09 MED ORDER — ROPINIROLE HCL 1 MG PO TABS: 1.0000 mg | ORAL_TABLET | Freq: Every evening | ORAL | Status: DC | PRN

## 2020-11-09 MED ORDER — PANTOPRAZOLE SODIUM 40 MG PO TBEC
40.0000 mg | DELAYED_RELEASE_TABLET | Freq: Every day | ORAL | Status: DC
  Administered 2020-11-09 – 2020-11-12 (×4): 40 mg via ORAL
  Filled 2020-11-09 (×4): qty 1

## 2020-11-09 MED ORDER — DIPHENOXYLATE-ATROPINE 2.5-0.025 MG PO TABS: 1.0000 | ORAL_TABLET | Freq: Every day | ORAL | Status: DC | PRN

## 2020-11-09 MED ORDER — POTASSIUM CHLORIDE CRYS ER 20 MEQ PO TBCR
40.0000 meq | EXTENDED_RELEASE_TABLET | Freq: Once | ORAL | Status: AC
  Administered 2020-11-09: 40 meq via ORAL
  Filled 2020-11-09: qty 2

## 2020-11-09 MED ORDER — POTASSIUM CHLORIDE 10 MEQ/100ML IV SOLN
10.0000 meq | INTRAVENOUS | Status: AC
  Administered 2020-11-09 (×2): 10 meq via INTRAVENOUS
  Filled 2020-11-09 (×2): qty 100

## 2020-11-09 MED ORDER — RISAQUAD PO CAPS
1.0000 | ORAL_CAPSULE | Freq: Every day | ORAL | Status: DC
  Administered 2020-11-10 – 2020-11-12 (×3): 1 via ORAL
  Filled 2020-11-09 (×3): qty 1

## 2020-11-09 MED ORDER — ASPIRIN EC 81 MG PO TBEC
81.0000 mg | DELAYED_RELEASE_TABLET | Freq: Every day | ORAL | Status: DC
  Administered 2020-11-09 – 2020-11-12 (×4): 81 mg via ORAL
  Filled 2020-11-09 (×4): qty 1

## 2020-11-09 MED ORDER — LIDOCAINE VISCOUS HCL 2 % MT SOLN
15.0000 mL | OROMUCOSAL | Status: DC | PRN
  Filled 2020-11-09: qty 15

## 2020-11-09 MED ORDER — FLUCONAZOLE 100 MG PO TABS
400.0000 mg | ORAL_TABLET | Freq: Every day | ORAL | Status: DC
  Administered 2020-11-09 – 2020-11-12 (×4): 400 mg via ORAL
  Filled 2020-11-09 (×5): qty 4

## 2020-11-09 MED ORDER — ADULT MULTIVITAMIN W/MINERALS CH
1.0000 | ORAL_TABLET | Freq: Every day | ORAL | Status: DC
  Administered 2020-11-09 – 2020-11-12 (×4): 1 via ORAL
  Filled 2020-11-09 (×4): qty 1

## 2020-11-09 MED ORDER — LACTATED RINGERS IV BOLUS
1000.0000 mL | Freq: Once | INTRAVENOUS | Status: AC
  Administered 2020-11-09: 1000 mL via INTRAVENOUS

## 2020-11-09 NOTE — H&P (Signed)
History and Physical    Nicole Bass:[id number] DOB: May 17, 1951 DOA: 11/09/2020  Referring MD/NP/PA:   PCP: [name], MD   Patient coming from:  The patient is coming from home.  At baseline, pt is independent for most of ADL.        Chief Complaint: syncope  HPI: Nicole Bass is a 70 y.o. female with medical history significant of Crohn's disease and rheumatoid arthritis on Xeljanz, hypertension, diet-controlled diabetes, GERD, depression, anxiety, vertigo, Mnire's disease, sclerosing mesenteritis, kidney stone, DDD, anemia, pyelonephritis, dCHF, cryptococcal pneumonia, recent back surgery, who presents with syncope.  Patient recently underwent back surgery of L5-S1 TLIF 10/12/20. Pt states that she went to GI clinic for f/u visit. She felt dizzy and lightheaded. Per her husband, pt passed out twice, each episode lasted for few minutes.  No seizure activity.  No unilateral numbness or tingling to extremities, no facial droop or slurred speech. Patient has mild shortness breath, no chest pain, fever or chills.  She has nausea and chronic vomited once earlier.  She has intermittent chronic diarrhea due to Crohn's disease which has not changed.  Currently patient does not have nausea, vomiting, diarrhea or abdominal pain.  No symptoms of UTI.  ED Course: pt was found to have WBC 10.5, troponin level 11, 9, negative urinalysis, pending COVID-19 PCR, potassium 2.8, magnesium 1.6, temperature normal, blood pressure 135/59, heart rate 88, RR 17, oxygen saturation 96% on room air.  Chest x-ray negative.  Patient is placed on MedSurg bed for observation  Review of Systems:   General: no fevers, chills, no body weight gain, has fatigue HEENT: no blurry vision, hearing changes or sore throat Respiratory: has dyspnea, no coughing, wheezing CV: no chest pain, no palpitations GI: has nausea, vomiting,  Diarrhea, no constipation, abdominal pain, GU: no dysuria, burning on  urination, increased urinary frequency, hematuria  Ext: no leg edema Neuro: no unilateral weakness, numbness, or tingling, no vision change or hearing loss. Has syncope. Skin: no rash, no skin tear. MSK: No muscle spasm, no deformity, no limitation of range of movement in spin Heme: No easy bruising.  Travel history: No recent long distant travel.  Allergy:  Allergies  Allergen Reactions  . Ciprofloxacin Itching and Rash  . Levofloxacin Hives and Rash  . Methotrexate     Other reaction(s): Other (See Comments) Mouth Ulcers/[place] Mouth and throat ulcers  . Methotrexate Derivatives     Mouth and throat ulcers  . Other Other (See Comments) and Rash    Other reaction(s): Other (See Comments), Unknown Per pts MD she is unable to take this medication because it is contraindicated with her other medications. Per pts MD she is unable to take this medication because it is contraindicated with her other medications.  Other reaction(s): Other (See Comments), Unknown Other reaction(s): Other (See Comments), Unknown Per pts MD she is unable to take this medication because it is contraindicated with her other medications. Per pts MD she is unable to take this medication because it is contraindicated with her other medications. Other reaction(s): Other (See Comments), Unknown Other reaction(s): Other (See Comments), Unknown Per pts MD she is unable to take this medication because it is contraindicated with her  Per pts MD she is unable to take this medication because it is contraindicated with her other medications. Other reaction(s): Other (See Comments) Other reaction(s): Other (See Comments), Unknown Per pts MD she is unable to take this medication because it is contraindicated with her  other medications. Per pts MD she is unable to take this medication because it is contraindicated with her other medications.  Other reaction(s): Other (See Comments), Unknown Other reaction(s):  Other (See Comments), Unknown Per pts MD she is unable to take this medication because it is contraindicated with her other medications. Per pts MD she is unable to take this medication because it is contraindicated with her other medications. Other reaction(s): Other (See Comments), Unknown Other reaction(s): Other (See Comments), Unknown Per pts MD she is unable to take this medication because it is contraindicated with her  Per pts MD she is unable to take this medication because it is contraindicated with her other medications.  . Sulfa Antibiotics Other (See Comments)    Per pts MD she is unable to take this medication because it is contraindicated with her other medications.  . Trelegy Ellipta [Fluticasone-Umeclidin-Vilant]     Laryngitis   . Cefazolin Itching and Rash  . Cefprozil Rash  . Cephalosporins Itching and Rash  . Enbrel [Etanercept] Itching and Rash  . Humira [Adalimumab] Itching and Rash  . Hydrocodone-Acetaminophen Itching and Other (See Comments)    Itching only  . Lorabid [Loracarbef] Itching and Rash  . Meropenem Itching and Rash  . Oxycodone Itching, Other (See Comments) and Rash    Other reaction(s): Other (See Comments)  . Oxycodone-Acetaminophen Itching  . Primidone Itching and Rash    Past Medical History:  Diagnosis Date  . Anemia    iron everyday, 3 iron infusions last one August 2020  . Anxiety    nervous  . Arthritis    rheumatoid  . Collagen vascular disease ([place])   . Crohn's disease ([place])   . Crohn's disease ([place])   . DDD (degenerative disc disease), cervical   . DDD (degenerative disc disease), cervical   . DDD (degenerative disc disease), cervical 2003  . DDD (degenerative disc disease), cervical   . DDD (degenerative disc disease), lumbar   . Degenerative disc disease, cervical   . Dyspnea    out of shape  . GERD (gastroesophageal reflux disease)   . Hand weakness    left hand worse, small tremors  . Headache    with  accident  . History of claustrophobia   . History of kidney stones   . HOH (hard of hearing)    left ear  . Hypertension     controlled on meds  . Lymphatic disorder   . Meniere's disease   . Motion sickness   . Nasal fracture 03/27/2020   due to fall  . Neuromuscular disorder ([place])   . Neuropathy    legs  . Peripheral neuropathy   . 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[place])   . UTI (urinary tract infection)    HO  . Vertigo    can be accompanied by nausea  . Wears dentures    upper dentures    Past Surgical History:  Procedure Laterality Date  . ABDOMINAL HYSTERECTOMY    . APPENDECTOMY    . BACK SURGERY     x 3  . CERVICAL SPINE SURGERY     metal plate  . [place]   X 2  . CHOLECYSTECTOMY    . CLOSED REDUCTION NASAL FRACTURE N/A 04/09/2020   Procedure: CLOSED REDUCTION NASAL FRACTURE;  Surgeon: Margaretha Sheffield, MD;  Location: [place];  Service: ENT;  Laterality: N/A;  . [place]   removed 18 inches of colon,removed appendix  . COLONOSCOPY    .  dental implants     3 teeth/ wears dentures  . DILATION AND CURETTAGE OF UTERUS  2004  . ELBOW FRACTURE SURGERY Left 2008  . FIBEROPTIC BRONCHOSCOPY N/A 06/01/2020   Procedure: BEDSIDE BRONCHOSCOPY FIBEROPTIC;  Surgeon: Ottie Glazier, MD;  Location: [facility];  Service: Thoracic;  Laterality: N/A;  . FRACTURE SURGERY    . JOINT REPLACEMENT Right 2015   partial knee replacement  . KYPHOPLASTY    . KYPHOPLASTY N/A 04/14/2015   Procedure: KYPHOPLASTY;  Surgeon: Hessie Knows, MD;  Location: [facility];  Service: Orthopedics;  Laterality: N/A;  . KYPHOPLASTY N/A 06/04/2015   Procedure: KYPHOPLASTY L-5;  Surgeon: Hessie Knows, MD;  Location: [facility];  Service: Orthopedics;  Laterality: N/A;  . KYPHOPLASTY    . LUMBAR LAMINECTOMY FOR EPIDURAL ABSCESS N/A 10/12/2020   Procedure: SYNOVIAL CYST RESECTION;  Surgeon: Deetta Perla, MD;  Location: [facility];  Service: Neurosurgery;  Laterality: N/A;  .  MAXIMUM ACCESS (MAS) TRANSFORAMINAL LUMBAR INTERBODY FUSION (TLIF) 1 LEVEL Left 10/12/2020   Procedure: OPEN L5/S1 TRANSFORAMINAL LUMBAR INTERBODY FUSION (TLIF) 1 LEVEL;  Surgeon: Deetta Perla, MD;  Location: [facility];  Service: Neurosurgery;  Laterality: Left;  [place] MEDIAL PARTIAL KNEE REPLACEMENT Right 2015  . NECK/PLATE  7824  . OOPHORECTOMY Bilateral   . ORIF NASAL FRACTURE N/A 04/09/2020   Procedure: OPEN REDUCTION INTERNAL FIXATION (ORIF) NASAL FRACTURE;  Surgeon: Margaretha Sheffield, MD;  Location: [place];  Service: ENT;  Laterality: N/A;  . REPAIR EXTENSOR TENDON Left 01/08/2019   Procedure: Realignment  EXTENSOR TENDON;  Surgeon: Hessie Knows, MD;  Location: [facility];  Service: Orthopedics;  Laterality: Left;  . REVERSE SHOULDER ARTHROPLASTY Right 04/18/2019   Procedure: REVERSE SHOULDER ARTHROPLASTY;  Surgeon: Corky Mull, MD;  Location: [facility];  Service: Orthopedics;  Laterality: Right;  . SHOULDER ARTHROSCOPY WITH ROTATOR CUFF REPAIR AND SUBACROMIAL DECOMPRESSION Right 04/13/2016   Procedure: SHOULDER ARTHROSCOPY WITH ROTATOR CUFF REPAIR AND SUBACROMIAL DECOMPRESSION, release of long head biceps tendon;  Surgeon: Leanor Kail, MD;  Location: [facility];  Service: Orthopedics;  Laterality: Right;  . TOE SURGERY Right 2013  . TONSILLECTOMY  1995  . UPPER GI ENDOSCOPY    . WRIST FRACTURE SURGERY Bilateral     Social History:  reports that she has never smoked. She has never used smokeless tobacco. She reports that she does not drink alcohol and does not use drugs.  Family History:  Family History  Problem Relation Age of Onset  . Diabetes Mother   . Emphysema Mother   . Emphysema Father   . Colon cancer Maternal Grandfather   . Breast cancer Neg Hx   . Ovarian cancer Neg Hx      Prior to Admission medications   Medication Sig Start Date End Date Taking? Authorizing Provider  acetaminophen (TYLENOL) 650 MG CR tablet Take 1,300 mg by mouth every 8 (eight) hours as needed  for pain.    [provider]  alendronate (FOSAMAX) 70 MG tablet Take 70 mg by mouth every Monday. Take with a full glass of water on an empty stomach.    [provider]  aspirin EC 81 MG tablet Take 81 mg by mouth daily. Swallow whole.    [provider]  baclofen (LIORESAL) 10 MG tablet Take 1 tablet (10 mg total) by mouth every 8 (eight) hours. 10/20/20   Deetta Perla, MD  betamethasone dipropionate 0.05 % lotion Apply topically 2 (two) times daily as needed (Rash). Apply thin coat to affected  areas twice daily for 2 weeks 09/07/20   Ralene Bathe, MD  cetirizine (ZYRTEC) 10 MG tablet Take 10 mg by mouth daily.    [provider]  chlorhexidine (PERIDEX) 0.12 % solution Use as directed 15 mLs in the mouth or throat as needed.     [provider]  cholecalciferol (VITAMIN D3) 25 MCG (1000 UT) tablet Take 1,000 Units by mouth daily.    [provider]  cyanocobalamin (,VITAMIN B-12,) 1000 MCG/ML injection Inject 1,000 mcg into the muscle every 30 (thirty) days.    [provider]  diazepam (VALIUM) 10 MG tablet Take 1 tablet (10 mg total) by mouth daily as needed (vaginal pain with intercourse, use before sexual activity per vagina). Patient taking differently: Take 10 mg by mouth as needed (vaginal pain with intercourse, use before sexual activity per vagina). 02/22/19   Hollice Espy, MD  diphenoxylate-atropine (LOMOTIL) 2.5-0.025 MG per tablet Take 1 tablet by mouth daily. am    [provider]  fluconazole (DIFLUCAN) 200 MG tablet Take 400 mg by mouth daily.    [provider]  furosemide (LASIX) 20 MG tablet Take 20 mg by mouth daily. am    [provider]  gabapentin (NEURONTIN) 100 MG capsule Take 100 mg by mouth every morning. 08/20/19   [provider]  gabapentin (NEURONTIN) 400 MG capsule Take 400 mg by mouth at bedtime.    [provider]  HYDROcodone-acetaminophen (NORCO) 10-325  MG tablet Take 1 tablet by mouth every 6 (six) hours as needed for severe pain. 10/20/20   Deetta Perla, MD  lidocaine (XYLOCAINE) 2 % solution Use as directed 15 mLs in the mouth or throat as needed for mouth pain.    [provider]  magnesium oxide (MAG-OX) 400 MG tablet Take 400 mg by mouth every Monday, Wednesday, and Friday. 3 times per week    [provider]  Multiple Vitamin (MULTIVITAMIN WITH MINERALS) TABS tablet Take 1 tablet by mouth daily.    [provider]  nitrofurantoin, macrocrystal-monohydrate, (MACROBID) 100 MG capsule Take 1 capsule (100 mg total) by mouth every 12 (twelve) hours. 10/20/20   Deetta Perla, MD  omeprazole (PRILOSEC) 40 MG capsule Take 40 mg by mouth daily before breakfast.     [provider]  potassium chloride SA (K-DUR,KLOR-CON) 20 MEQ tablet Take 20 mEq by mouth every Monday, Wednesday, and Friday.    [provider]  predniSONE (DELTASONE) 1 MG tablet Take 3 mg by mouth daily with breakfast.     [provider]  Probiotic Product (PROBIOTIC PO) Take 1 capsule by mouth daily.     [provider]  rOPINIRole (REQUIP) 1 MG tablet Take 1 mg by mouth at bedtime as needed (for restless legs).     [provider]  sucralfate (CARAFATE) 1 g tablet Take 0.5 g by mouth 3 (three) times daily with meals as needed (for stomach cramping (crohn's disease)).     [provider]  telmisartan (MICARDIS) 20 MG tablet Take 20 mg by mouth every Monday, Wednesday, and Friday. 08/30/18   [provider]    Physical Exam: Vitals:   11/09/20 1210 11/09/20 1630 11/09/20 1700 11/09/20 1726  BP: (!) 135/59 (!) 141/60  113/84  Pulse: 88 98 94 85  Resp: 16 18 17 20   Temp:  (!) 97.5 F (36.4 C)  98 F (36.7 C)  TempSrc:    Oral  SpO2: 99% 100% 97% 97%  Weight:  Height:       General: Not in acute distress. HEENT:       Eyes: PERRL, EOMI, no scleral icterus.       ENT: No discharge from  the ears and nose, no pharynx injection, no tonsillar enlargement.        Neck: No JVD, no bruit, no mass felt. Heme: No neck lymph node enlargement. Cardiac: S1/S2, RRR, No murmurs, No gallops or rubs. Respiratory: No rales, wheezing, rhonchi or rubs. GI: Soft, nondistended, nontender, no rebound pain, no organomegaly, BS present. GU: No hematuria Ext: No pitting leg edema bilaterally. 1+DP/PT pulse bilaterally. Musculoskeletal: No joint deformities, No joint redness or warmth, no limitation of ROM in spin. Skin: No rashes.  Neuro: Alert, oriented X3, cranial nerves II-XII grossly intact, moves all extremities normally. Muscle strength 5/5 in all extremities Psych: Patient is not psychotic, no suicidal or hemocidal ideation.  Labs on Admission: I have personally reviewed following labs and imaging studies  CBC: Recent Labs  Lab 11/09/20 0935  WBC 10.5  HGB 10.0*  HCT 31.3*  MCV 95.1  PLT 831*   Basic Metabolic Panel: Recent Labs  Lab 11/09/20 0935 11/09/20 1233  NA 140  --   K 2.5* 2.8*  CL 103  --   CO2 26  --   GLUCOSE 125*  --   BUN 12  --   CREATININE 0.72  --   CALCIUM 8.8*  --   MG 1.6*  --    GFR: Estimated Creatinine Clearance: 65.7 mL/min (by C-G formula based on SCr of 0.72 mg/dL). Liver Function Tests: Recent Labs  Lab 11/09/20 0935  AST 54*  ALT 18  ALKPHOS 95  BILITOT 0.4  PROT 6.9  ALBUMIN 3.2*   No results for input(s): LIPASE, AMYLASE in the last 168 hours. No results for input(s): AMMONIA in the last 168 hours. Coagulation Profile: No results for input(s): INR, PROTIME in the last 168 hours. Cardiac Enzymes: No results for input(s): CKTOTAL, CKMB, CKMBINDEX, TROPONINI in the last 168 hours. BNP (last 3 results) No results for input(s): PROBNP in the last 8760 hours. HbA1C: No results for input(s): HGBA1C in the last 72 hours. CBG: No results for input(s): GLUCAP in the last 168 hours. Lipid Profile: No results for input(s): CHOL,  HDL, LDLCALC, TRIG, CHOLHDL, LDLDIRECT in the last 72 hours. Thyroid Function Tests: No results for input(s): TSH, T4TOTAL, FREET4, T3FREE, THYROIDAB in the last 72 hours. Anemia Panel: No results for input(s): VITAMINB12, FOLATE, FERRITIN, TIBC, IRON, RETICCTPCT in the last 72 hours. Urine analysis:    Component Value Date/Time   COLORURINE YELLOW (A) 11/09/2020 1209   APPEARANCEUR CLEAR (A) 11/09/2020 1209   APPEARANCEUR Clear 06/16/2020 0915   LABSPEC 1.013 11/09/2020 1209   LABSPEC 1.009 10/19/2013 1359   PHURINE 7.0 11/09/2020 1209   GLUCOSEU NEGATIVE 11/09/2020 1209   GLUCOSEU Negative 10/19/2013 1359   HGBUR NEGATIVE 11/09/2020 1209   BILIRUBINUR NEGATIVE 11/09/2020 1209   BILIRUBINUR Negative 06/16/2020 0915   BILIRUBINUR Negative 10/19/2013 [place] 11/09/2020 1209   PROTEINUR NEGATIVE 11/09/2020 1209   NITRITE NEGATIVE 11/09/2020 1209   LEUKOCYTESUR NEGATIVE 11/09/2020 1209   LEUKOCYTESUR Trace 10/19/2013 1359   Sepsis Labs: @LABRCNTIP (procalcitonin:4,lacticidven:4) )No results found for this or any previous visit (from the past 240 hour(s)).   Radiological Exams on Admission: DG Chest 2 View  Result Date: 11/09/2020 CLINICAL DATA:  Emesis and syncope. EXAM: CHEST - 2 VIEW COMPARISON:  10/17/2020 FINDINGS: Stable cardiomediastinal  contours. No pleural effusion or interstitial edema. Similar appearance known right upper lobe lung nodules. These measure up to 1.5 cm. Previously characterized as sequelae of pulmonary fungal infection. No acute superimposed airspace consolidation. Previous right shoulder arthroplasty and ACDF within the lower cervical spine. Treated compression fractures noted within the midthoracic and upper lumbar spine. IMPRESSION: No acute cardiopulmonary abnormalities. Electronically Signed   By: Kerby Moors M.D.   On: 11/09/2020 11:44     EKG: I have personally reviewed.  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 The differential diagnosis is broad, including vasovagal syncope, TIA/stroke, arrhythmia, orthostatic status. Given recent back surgery, PE is an another important differential diagnosis.  - Place on med bed for obs - Orthostatic vital signs  - CT-head (patient has severe claustrophobia, does not want to do MRI of the brain) - 2d echo - Neuro checks  - IVF: 1 L LR, then 75 cc/h -Hold Lasix - D-dimer --> if positive, will get CTA to r/o PE.  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  Patient does not have leg edema.  No signs of CHF exacerbation. -Hold Lasix -Check BNP --> 94         DVT ppx: SQ Lovenox Code Status: Full code Family Communication: Yes, patient's  husband  at bed side Disposition Plan:  Anticipate discharge back to previous environment Consults called:  none Admission status and Level of care: Med-Surg:     for obs   Status is: Observation  The patient remains OBS appropriate and will d/c before 2  midnights.  Dispo: The patient is from: home              Anticipated d/c is to: Home              Patient currently is not medically stable to d/c.   Difficult to place patient No         Date of Service 11/09/2020    [facility]   If 7PM-7AM, please contact night-coverage [email or web address] 11/09/2020, 6:46 PM

## 2020-11-09 NOTE — ED Notes (Signed)
MD aware of patient unable to tolerate IV potassium, MD to order additional PO potassium. Patient tolerated PO well. Patient to be admitted for observation.

## 2020-11-09 NOTE — ED Provider Notes (Signed)
[facility] Emergency Department Provider Note  ____________________________________________   Event Date/Time   First MD Initiated Contact with Patient 11/09/20 [phone]     (approximate)  I have reviewed the triage vital signs and the nursing notes.   HISTORY  Chief Complaint Loss of Consciousness and Emesis   HPI Nicole Bass is a 70 y.o. 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  Patient states that she does not have any other acute symptoms      Past Medical History:  Diagnosis Date  . Anemia    iron everyday, 3 iron infusions last one August 2020  . Anxiety    nervous  . Arthritis    rheumatoid  . Collagen vascular disease ([place])   . Crohn's disease ([place])   . Crohn's disease ([place])   . DDD (degenerative disc disease), cervical   . DDD (degenerative disc disease), cervical   . DDD (degenerative disc disease), cervical 2003  . DDD (degenerative disc disease), cervical   . DDD (degenerative disc disease), lumbar   . Degenerative disc disease, cervical   . Dyspnea    out of shape  . GERD (gastroesophageal reflux disease)   . Hand weakness    left hand worse, small tremors  . Headache    with accident  . History of claustrophobia   . History of kidney stones   . HOH (hard of hearing)    left ear  . Hypertension     controlled on meds  . Lymphatic disorder   . Meniere's disease   . Motion  sickness   . Nasal fracture 03/27/2020   due to fall  . Neuromuscular disorder ([place])   . Neuropathy    legs  . Peripheral neuropathy   . 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 UTI (urinary tract infection)    HO  . Vertigo    can be accompanied by nausea  . Wears dentures    upper dentures    Patient Active Problem List   Diagnosis Date Noted  . Syncope 11/09/2020  . Normocytic anemia 11/09/2020  . Hypomagnesemia 11/09/2020  . 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 Chronic diastolic CHF (congestive heart failure) ([place]) 11/09/2020  . Fusion of lumbar spine 10/17/2020  . Lumbar stenosis 10/12/2020  . Swelling of limb 06/02/2020  . Cervical spondylosis 05/01/2020  . Atherosclerosis of native coronary artery of native heart without angina pectoris 04/27/2020  . Numbness and tingling in left hand 10/29/2019  . 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 Status post right partial knee replacement 02/12/2019  . Rotator cuff arthropathy, right 01/25/2019  . Iron deficiency anemia 09/13/2018  . Anemia, unspecified 09/09/2018  . Iron deficiency 09/04/2018  . Acute kidney injury ([place]) 08/23/2018  . HTN, goal below 140/80 06/04/2018  . Hyperthyroidism 02/08/2018  . Imbalance 12/18/2017  . Stomatitis, ulcerative 09/06/2017  . Dyspareunia in female 08/22/2017  . Vaginal atrophy 08/22/2017  . Obesity (BMI 30.0-34.9) 08/22/2017  . Varicose veins of leg  with pain, left 04/05/2017  . Peripheral polyneuropathy 04/05/2017  . Lumbar radiculopathy 04/05/2017  . Tremor 02/13/2017  . Sensory neuropathy 02/13/2017  . Restless leg syndrome 02/13/2017  . Impingement syndrome of left shoulder 09/29/2016  . Acute diarrhea 09/06/2016  . History of pyelonephritis 09/06/2016  . Hypokalemia 09/06/2016  . 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 Pyelonephritis 08/29/2016  . Rheumatoid arthritis, unspecified ([place]) 08/29/2016  . Nontraumatic complete tear of  right rotator cuff 03/08/2016  . Acute pain of right shoulder 01/27/2016  . Type 2 diabetes mellitus with diabetic neuropathy ([place]) 10/27/2015  . Closed fracture of distal end of right radius with routine healing 09/17/2015  . Crohn's colitis ([place]) 09/04/2015  . Acute bilateral low back pain without sciatica 03/23/2015  . SOB (shortness of breath) on exertion 01/15/2015  . Pedal edema 01/15/2015  . Status post open reduction with internal fixation of fracture 11/27/2014  . Aftercare for healing traumatic fracture of lower arm 09/25/2014  . Painful rib 09/11/2014  . Primary osteoarthritis of left knee 04/24/2014  . Osteoporosis 03/17/2014  . Depression with anxiety 03/17/2014  . B12 deficiency 03/17/2014  . Crohn's disease, unspecified, without complications ([place]) 35/36/1443  . Urolith 07/16/2013  . Right flank pain 07/16/2013  . Abdominal pain 07/16/2013    Past Surgical History:  Procedure Laterality Date  . ABDOMINAL HYSTERECTOMY    . APPENDECTOMY    . BACK SURGERY     x 3  . CERVICAL SPINE SURGERY     metal plate  . [place]   X 2  . CHOLECYSTECTOMY    . CLOSED REDUCTION NASAL FRACTURE N/A 04/09/2020   Procedure: CLOSED REDUCTION NASAL FRACTURE;  Surgeon: Margaretha Sheffield, MD;  Location: [facility];  Service: ENT;  Laterality: N/A;  . [place]   removed 18 inches of colon,removed appendix  . COLONOSCOPY    . dental implants     3 teeth/ wears dentures  . DILATION AND CURETTAGE OF UTERUS  2004  . ELBOW FRACTURE SURGERY Left 2008  . FIBEROPTIC BRONCHOSCOPY N/A 06/01/2020   Procedure: BEDSIDE BRONCHOSCOPY FIBEROPTIC;  Surgeon: Ottie Glazier, MD;  Location: [facility];  Service: Thoracic;  Laterality: N/A;  . FRACTURE SURGERY    . JOINT REPLACEMENT Right 2015   partial knee replacement  . KYPHOPLASTY    . KYPHOPLASTY N/A 04/14/2015   Procedure: KYPHOPLASTY;  Surgeon: Hessie Knows, MD;  Location: [facility];  Service: Orthopedics;   Laterality: N/A;  . KYPHOPLASTY N/A 06/04/2015   Procedure: KYPHOPLASTY L-5;  Surgeon: Hessie Knows, MD;  Location: [facility];  Service: Orthopedics;  Laterality: N/A;  . KYPHOPLASTY    . LUMBAR LAMINECTOMY FOR EPIDURAL ABSCESS N/A 10/12/2020   Procedure: SYNOVIAL CYST RESECTION;  Surgeon: Deetta Perla, MD;  Location: [facility];  Service: Neurosurgery;  Laterality: N/A;  . MAXIMUM ACCESS (MAS) TRANSFORAMINAL LUMBAR INTERBODY FUSION (TLIF) 1 LEVEL Left 10/12/2020   Procedure: OPEN L5/S1 TRANSFORAMINAL LUMBAR INTERBODY FUSION (TLIF) 1 LEVEL;  Surgeon: Deetta Perla, MD;  Location: [facility];  Service: Neurosurgery;  Laterality: Left;  Marland Kitchen MEDIAL PARTIAL KNEE REPLACEMENT Right 2015  . NECK/PLATE  1540  . OOPHORECTOMY Bilateral   . ORIF NASAL FRACTURE N/A 04/09/2020   Procedure: OPEN REDUCTION INTERNAL FIXATION (ORIF) NASAL FRACTURE;  Surgeon: Margaretha Sheffield, MD;  Location: [facility];  Service: ENT;  Laterality: N/A;  . REPAIR EXTENSOR TENDON Left 01/08/2019   Procedure: Realignment  EXTENSOR TENDON;  Surgeon: Hessie Knows,  MD;  Location: [facility];  Service: Orthopedics;  Laterality: Left;  . REVERSE SHOULDER ARTHROPLASTY Right 04/18/2019   Procedure: REVERSE SHOULDER ARTHROPLASTY;  Surgeon: Corky Mull, MD;  Location: [facility];  Service: Orthopedics;  Laterality: Right;  . SHOULDER ARTHROSCOPY WITH ROTATOR CUFF REPAIR AND SUBACROMIAL DECOMPRESSION Right 04/13/2016   Procedure: SHOULDER ARTHROSCOPY WITH ROTATOR CUFF REPAIR AND SUBACROMIAL DECOMPRESSION, release of long head biceps tendon;  Surgeon: Leanor Kail, MD;  Location: [facility];  Service: Orthopedics;  Laterality: Right;  . TOE SURGERY Right 2013  . TONSILLECTOMY  1995  . UPPER GI ENDOSCOPY    . WRIST FRACTURE SURGERY Bilateral     Prior to Admission medications   Medication Sig Start Date End Date Taking? Authorizing Provider  acetaminophen (TYLENOL) 650 MG CR tablet Take 1,300 mg by mouth every 8 (eight) hours as needed for pain.    Yes [provider]  alendronate (FOSAMAX) 70 MG tablet Take 70 mg by mouth every Monday. Take with a full glass of water on an empty stomach.   Yes [provider]  aspirin EC 81 MG tablet Take 81 mg by mouth daily. Swallow whole.   Yes [provider]  betamethasone dipropionate 0.05 % lotion Apply topically 2 (two) times daily as needed (Rash). Apply thin coat to affected areas twice daily for 2 weeks 09/07/20  Yes Ralene Bathe, MD  cetirizine (ZYRTEC) 10 MG tablet Take 10 mg by mouth daily.   Yes [provider]  chlorhexidine (PERIDEX) 0.12 % solution Use as directed 15 mLs in the mouth or throat as needed (mouth sores).   Yes [provider]  cholecalciferol (VITAMIN D3) 25 MCG (1000 UT) tablet Take 1,000 Units by mouth daily.   Yes [provider]  cyanocobalamin (,VITAMIN B-12,) 1000 MCG/ML injection Inject 1,000 mcg into the muscle every 30 (thirty) days.   Yes [provider]  diazepam (VALIUM) 10 MG tablet Take 1 tablet (10 mg total) by mouth daily as needed (vaginal pain with intercourse, use before sexual activity per vagina). Patient taking differently: Take 10 mg by mouth as needed (vaginal pain with intercourse, use before sexual activity per vagina). 02/22/19  Yes Hollice Espy, MD  diphenoxylate-atropine (LOMOTIL) 2.5-0.025 MG per tablet Take 1 tablet by mouth daily as needed (Crohn's symptoms).   Yes [provider]  fluconazole (DIFLUCAN) 200 MG tablet Take 400 mg by mouth daily.   Yes [provider]  furosemide (LASIX) 20 MG tablet Take 20 mg by mouth daily.   Yes [provider]  gabapentin (NEURONTIN) 100 MG capsule Take 100 mg by mouth every morning. 08/20/19  Yes [provider]  gabapentin (NEURONTIN) 400 MG capsule Take 400 mg by mouth at bedtime.   Yes [provider]  lidocaine (XYLOCAINE) 2 % solution Use as directed 15 mLs in the mouth or throat as needed for  mouth pain.   Yes [provider]  magnesium oxide (MAG-OX) 400 MG tablet Take 400 mg by mouth every Monday, Wednesday, and Friday.   Yes [provider]  Multiple Vitamin (MULTIVITAMIN WITH MINERALS) TABS tablet Take 1 tablet by mouth daily.   Yes [provider]  omeprazole (PRILOSEC) 40 MG capsule Take 40 mg by mouth daily before breakfast.    Yes [provider]  potassium chloride SA (K-DUR,KLOR-CON) 20 MEQ tablet Take 20 mEq by mouth every Monday, Wednesday, and Friday.   Yes [provider]  predniSONE (DELTASONE) 1 MG tablet Take  3 mg by mouth daily with breakfast.    Yes [provider]  Probiotic Product (PROBIOTIC PO) Take 1 capsule by mouth daily.    Yes [provider]  rOPINIRole (REQUIP) 1 MG tablet Take 1 mg by mouth at bedtime as needed (for restless legs).    Yes [provider]  sucralfate (CARAFATE) 1 g tablet Take 0.5 g by mouth 3 (three) times daily with meals as needed (stomach cramping/Crohn's symptoms).   Yes [provider]  Tofacitinib Citrate (XELJANZ) 5 MG TABS Take 5 mg by mouth 2 (two) times daily.   Yes [provider]  traMADol (ULTRAM) 50 MG tablet Take 50 mg by mouth 2 (two) times daily as needed for pain. 11/02/20  Yes [provider]  HYDROcodone-acetaminophen (NORCO) 10-325 MG tablet Take 1 tablet by mouth every 6 (six) hours as needed for severe pain. Patient not taking: No sig reported 10/20/20   Deetta Perla, MD  nitrofurantoin, macrocrystal-monohydrate, (MACROBID) 100 MG capsule Take 1 capsule (100 mg total) by mouth every 12 (twelve) hours. Patient not taking: No sig reported 10/20/20   Deetta Perla, MD  telmisartan (MICARDIS) 20 MG tablet Take 20 mg by mouth every Monday, Wednesday, and Friday. 08/30/18   [provider]    Allergies Ciprofloxacin, Levofloxacin, Methotrexate, Methotrexate derivatives, Other, Sulfa antibiotics, Trelegy ellipta  [fluticasone-umeclidin-vilant], Cefazolin, Cefprozil, Cephalosporins, Enbrel [etanercept], Humira [adalimumab], Hydrocodone-acetaminophen, Lorabid [loracarbef], Meropenem, Oxycodone, Oxycodone-acetaminophen, and Primidone  Family History  Problem Relation Age of Onset  . Diabetes Mother   . Emphysema Mother   . Emphysema Father   . Colon cancer Maternal Grandfather   . Breast cancer Neg Hx   . Ovarian cancer Neg Hx     Social History Social History   Tobacco Use  . Smoking status: Never Smoker  . Smokeless tobacco: Never Used  Vaping Use  . Vaping Use: Never used  Substance Use Topics  . Alcohol use: No  . Drug use: No    Review of Systems  Review of Systems  Constitutional: Negative for chills and fever.  HENT: Negative for sore throat.   Eyes: Negative for pain.  Respiratory: Negative for cough and stridor.   Cardiovascular: Negative for chest pain.  Gastrointestinal: Positive for nausea. Negative for vomiting.  Skin: Negative for rash.  Neurological: Positive for loss of consciousness. Negative for seizures and headaches.  Psychiatric/Behavioral: Negative for suicidal ideas.  All other systems reviewed and are negative.     ____________________________________________   PHYSICAL EXAM:  VITAL SIGNS: ED Triage Vitals  Enc Vitals Group     BP 11/09/20 0919 120/71     Pulse Rate 11/09/20 0919 78     Resp 11/09/20 0919 16     Temp 11/09/20 0919 98.1 F (36.7 C)     Temp Source 11/09/20 0919 Oral     SpO2 11/09/20 0919 98 %     Weight 11/09/20 0920 175 lb 0.7 oz (79.4 kg)     Height 11/09/20 0920 5' 2"  (1.575 m)     Head Circumference --      Peak Flow --      Pain Score 11/09/20 0920 0     Pain Loc --      Pain Edu? --      Excl. in [place]? --    Vitals:   11/09/20 1210 11/09/20 1630  BP: (!) 135/59 (!) 141/60  Pulse: 88 98  Resp: 16 18  Temp:  (!) 97.5 F (36.4 C)  SpO2: 99% 100%   Physical Exam Vitals and nursing note reviewed.  Constitutional:       General: She is not in acute distress.    Appearance: She is well-developed.  HENT:     Head: Normocephalic and atraumatic.     Right Ear: External ear normal.     Left Ear: External ear normal.     Nose: Nose normal.     Mouth/Throat:     Mouth: Mucous membranes are dry.  Eyes:     Conjunctiva/sclera: Conjunctivae normal.  Cardiovascular:     Rate and Rhythm: Normal rate and regular rhythm.     Heart sounds: No murmur heard.   Pulmonary:     Effort: Pulmonary effort is normal. No respiratory distress.     Breath sounds: Normal breath sounds.  Abdominal:     Palpations: Abdomen is soft.     Tenderness: There is no abdominal tenderness.  Musculoskeletal:     Cervical back: Neck supple.  Skin:    General: Skin is warm and dry.     Capillary Refill: Capillary refill takes more than 3 seconds.  Neurological:     Mental Status: She is alert and oriented to person, place, and time.  Psychiatric:        Mood and Affect: Mood is anxious.     Cranial nerves II through XII grossly intact.  No finger dysmetria.  No pronator drift.  Patient has symmetric strength in all extremities.  Sensation is intact to light touch of all extremities.  2+ bilateral radial pulses.  Patient's spinal incision sites appear clean dry and intact and healing appropriately without any evidence on exam of infection. ____________________________________________   LABS (all labs ordered are listed, but only abnormal results are displayed)  Labs Reviewed  BASIC METABOLIC PANEL - Abnormal; Notable for the following components:      Result Value   Potassium 2.5 (*)    Glucose, Bld 125 (*)    Calcium 8.8 (*)    All other components within normal limits  CBC - Abnormal; Notable for the following components:   RBC 3.29 (*)    Hemoglobin 10.0 (*)    HCT 31.3 (*)    Platelets 538 (*)    All other components within normal limits  URINALYSIS, COMPLETE (UACMP) WITH MICROSCOPIC - Abnormal; Notable for the  following components:   Color, Urine YELLOW (*)    APPearance CLEAR (*)    Bacteria, UA RARE (*)    All other components within normal limits  HEPATIC FUNCTION PANEL - Abnormal; Notable for the following components:   Albumin 3.2 (*)    AST 54 (*)    All other components within normal limits  MAGNESIUM - Abnormal; Notable for the following components:   Magnesium 1.6 (*)    All other components within normal limits  POTASSIUM - Abnormal; Notable for the following components:   Potassium 2.8 (*)    All other components within normal limits  SARS CORONAVIRUS 2 (TAT 6-24 HRS)  BRAIN NATRIURETIC PEPTIDE  D-DIMER, QUANTITATIVE  HIV ANTIBODY (ROUTINE TESTING W REFLEX)  CBG MONITORING, ED  TROPONIN I (HIGH SENSITIVITY)  TROPONIN I (HIGH SENSITIVITY)   ____________________________________________  EKG  Sinus rhythm with ventricular of 86, normal axis, normal intervals, nonspecific ST change versus artifact in the inferior leads lead III and aVF without other clearance of acute ischemia or other significant underlying arrhythmia. ____________________________________________  RADIOLOGY  ED MD interpretation: No evidence of pneumonia, thorax, effusion, edema  or other clear acute intrathoracic process there is evidence of prior right shoulder arthroplasty and ACDF in the lower cervical spine.  There are also evidence of remote thoracic and upper lumbar compression fractures.   Official radiology report(s): DG Chest 2 View  Result Date: 11/09/2020 CLINICAL DATA:  Emesis and syncope. EXAM: CHEST - 2 VIEW COMPARISON:  10/17/2020 FINDINGS: Stable cardiomediastinal contours. No pleural effusion or interstitial edema. Similar appearance known right upper lobe lung nodules. These measure up to 1.5 cm. Previously characterized as sequelae of pulmonary fungal infection. No acute superimposed airspace consolidation. Previous right shoulder arthroplasty and ACDF within the lower cervical spine. Treated  compression fractures noted within the midthoracic and upper lumbar spine. IMPRESSION: No acute cardiopulmonary abnormalities. Electronically Signed   By: Kerby Moors M.D.   On: 11/09/2020 11:44    ____________________________________________   PROCEDURES  Procedure(s) performed (including Critical Care):  .1-3 Lead EKG Interpretation Performed by: Lucrezia Starch, MD Authorized by: Lucrezia Starch, MD     Interpretation: normal     ECG rate assessment: normal     Rhythm: sinus rhythm     Ectopy: none     Conduction: normal       ____________________________________________   INITIAL IMPRESSION / ASSESSMENT AND PLAN / ED COURSE      Patient presents with above-stated exam for assessment of 2 syncopal episodes witnessed by husband earlier today as well as some nonbloody nonbilious vomiting.  This in the setting of 2 recent hospitalizations after lower spine surgery in February.   Differential includes syncope related to orthostasis, vasovagal, ACS, arrhythmia, PE, metabolic derangements, acute infectious process, symptomatic carotid stenosis, cardiomyopathy and symptomatic anemia.  No focal deficits on exam to suggest CVA patient is not altered or confused and will suspicion for toxic ingestion.  Chest x-ray obtained shows no evidence of pneumonia and there are no obvious foci of infection on exam i.e. cellulitis.  UA does not appear infected.  It is certainly possible patient has infectious gastroenteritis as she does not have any historical or exam features to suggest enteritis at this time i.e. abdominal pain or diarrhea.  EKG has some nonspecific findings but troponins are nonelevated x2 and not consistent with ACS.  No evidence of significant arrhythmia.  Very low suspicion for PE as patient is not tachycardic, hypoxic, tachypneic and denies any chest pain or shortness of breath and has no asymmetric lower extremity edema or hemoptysis.  She does not appear acutely volume  overloaded.  In addition BNP is not elevated and not consistent with CHF.  BMP remarkable for potassium of 2.5 no other significant electrolyte or metabolic derangements.  Hepatic function panel is unremarkable and given absence of tenderness in the epigastric or right upper quadrant Evalose patient for cholecystitis or other cholestatic process.  Magnesium is 1.6.  Patient was treated with IV fluids and aggressive repletion of magnesium and potassium.  Given unclear etiology with potassium still below 3 on recheck I will plan admit to hospital service for further evaluation and management of hyper syncope.       ____________________________________________   FINAL CLINICAL IMPRESSION(S) / ED DIAGNOSES  Final diagnoses:  Syncope and collapse  Hypomagnesemia  Hypokalemia  Non-intractable vomiting with nausea, unspecified vomiting type  Dehydration    Medications  potassium chloride 10 mEq in 100 mL IVPB (10 mEq Intravenous Not Given 11/09/20 1444)  ondansetron (ZOFRAN) injection 4 mg (has no administration in time range)  hydrALAZINE (APRESOLINE) injection 5 mg (has no  administration in time range)  acetaminophen (TYLENOL) tablet 650 mg (has no administration in time range)  aspirin EC tablet 81 mg (has no administration in time range)  Tofacitinib Citrate TABS 5 mg (has no administration in time range)  HYDROcodone-acetaminophen (NORCO) 10-325 MG per tablet 1 tablet (has no administration in time range)  fluconazole (DIFLUCAN) tablet 400 mg (has no administration in time range)  predniSONE (DELTASONE) tablet 3 mg (has no administration in time range)  diphenoxylate-atropine (LOMOTIL) 2.5-0.025 MG per tablet 1 tablet (has no administration in time range)  magnesium oxide (MAG-OX) tablet 400 mg (has no administration in time range)  pantoprazole (PROTONIX) EC tablet 40 mg (has no administration in time range)  sucralfate (CARAFATE) tablet 0.5 g (has no administration in time range)   gabapentin (NEURONTIN) capsule 100 mg (has no administration in time range)  gabapentin (NEURONTIN) capsule 400 mg (has no administration in time range)  rOPINIRole (REQUIP) tablet 1 mg (has no administration in time range)  cholecalciferol (VITAMIN D3) tablet 1,000 Units (has no administration in time range)  multivitamin with minerals tablet 1 tablet (has no administration in time range)  loratadine (CLARITIN) tablet 10 mg (has no administration in time range)  lidocaine (XYLOCAINE) 2 % viscous mouth solution 15 mL (has no administration in time range)  enoxaparin (LOVENOX) injection 40 mg (has no administration in time range)  acidophilus (RISAQUAD) capsule 1 capsule (has no administration in time range)  potassium chloride SA (KLOR-CON) CR tablet 40 mEq (40 mEq Oral Given 11/09/20 1020)  ondansetron (ZOFRAN) injection 4 mg (4 mg Intravenous Given 11/09/20 1021)  lactated ringers bolus 1,000 mL (0 mLs Intravenous Stopped 11/09/20 1245)  magnesium sulfate IVPB 2 g 50 mL (0 g Intravenous Stopped 11/09/20 1127)  diazepam (VALIUM) tablet 5 mg (5 mg Oral Given 11/09/20 1042)  potassium chloride SA (KLOR-CON) CR tablet 40 mEq (40 mEq Oral Given 11/09/20 1419)     ED Discharge Orders    None       Note:  This document was prepared using Dragon voice recognition software and may include unintentional dictation errors.   Lucrezia Starch, MD 11/09/20 [phone]

## 2020-11-09 NOTE — ED Triage Notes (Signed)
C/O emesis and syncope this morning at the GI doctors this morning.  Recent back surgery.  AAOx3. Skin pale, warm and dry.

## 2020-11-09 NOTE — ED Notes (Signed)
Phlebotomy notified of need for blood draw

## 2020-11-09 NOTE — ED Notes (Signed)
Patient crying and states she cannot tolerate any more potassium infusion, patient repeat K level in lab, patient informed we will wait to see result and then re start K infusion with addition IVF.

## 2020-11-10 ENCOUNTER — Observation Stay: Payer: Medicare Other

## 2020-11-10 DIAGNOSIS — R55 Syncope and collapse: Secondary | ICD-10-CM | POA: Diagnosis not present

## 2020-11-10 LAB — CBC
HCT: 28.7 % — ABNORMAL LOW (ref 36.0–46.0)
Hemoglobin: 8.8 g/dL — ABNORMAL LOW (ref 12.0–15.0)
MCH: 30 pg (ref 26.0–34.0)
MCHC: 30.7 g/dL (ref 30.0–36.0)
MCV: 98 fL (ref 80.0–100.0)
Platelets: 426 10*3/uL — ABNORMAL HIGH (ref 150–400)
RBC: 2.93 MIL/uL — ABNORMAL LOW (ref 3.87–5.11)
RDW: 15.2 % (ref 11.5–15.5)
WBC: 9.1 10*3/uL (ref 4.0–10.5)
nRBC: 0 % (ref 0.0–0.2)

## 2020-11-10 LAB — BASIC METABOLIC PANEL
Anion gap: 6 (ref 5–15)
BUN: 10 mg/dL (ref 8–23)
CO2: 28 mmol/L (ref 22–32)
Calcium: 8.5 mg/dL — ABNORMAL LOW (ref 8.9–10.3)
Chloride: 106 mmol/L (ref 98–111)
Creatinine, Ser: 0.73 mg/dL (ref 0.44–1.00)
GFR, Estimated: >60 mL/min (ref >60)
Glucose, Bld: 124 mg/dL — ABNORMAL HIGH (ref 70–99)
Potassium: 4.3 mmol/L (ref 3.5–5.1)
Sodium: 140 mmol/L (ref 135–145)

## 2020-11-10 LAB — MAGNESIUM: Magnesium: 1.9 mg/dL (ref 1.7–2.4)

## 2020-11-10 LAB — SARS CORONAVIRUS 2 (TAT 6-24 HRS): SARS Coronavirus 2: NEGATIVE

## 2020-11-10 LAB — HIV ANTIBODY (ROUTINE TESTING W REFLEX): HIV Screen 4th Generation wRfx: NONREACTIVE

## 2020-11-10 NOTE — Progress Notes (Signed)
PROGRESS NOTE    Nicole Bass  GSU:[id number] DOB: 11-22-1950 DOA: 11/09/2020 PCP: [name], MD   Brief Narrative: 70 year old with past medical history significant for Crohn's disease, rheumatoid arthritis on Xeljanz, hypertension, diet-controlled diabetes, depression, anxiety, Mnire disease, sclerosing mesenteritis, kidney stone, diastolic heart failure, cryptococcal pneumonia, recent back surgery who presented after syncope episode.  Underwent back surgery on L5-S1 TL IVF 28/28/2022.  Patient went to GI clinic for follow-up visit, she felt dizzy and lightheaded.  Per husband patient passed out twice, episode lasted few minutes.  No seizure-like activity.  No focal neurological deficit.  She vomited once, she has intermittent chronic diarrhea due to Crohn's disease.  Patient was found to have potassium of 2.8, magnesium of 1.6 white count 10.5 troponin XI. D-dimer elevated, CT angio: Negative for PE, changes consistent with prior granulomatous disease, Pulmonary nodular changes in the right upper lobe consistent with the given clinical history of prior fungal infection. They have decreased somewhat in the interval from the prior exam. No new focal infiltrate is seen.    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  Correct  electrolytes -Orthostatic vitals this morning negative. -2D echo: Pending.  -CT head: No acute intracranial abnormality -Likely related to dehydration and electrolyte  abnormalities  Crohn's Colitis and rheumatoid arthritis Continue with prednisone, Tofacitinib.  Depression with anxiety  HTN;  Micardis due to dehydration  Hypokalemia and hypomagnesemia Replaced IV and orally.  Anemia; hemoglobin stable Hb today at 8 consistent with her previous baseline.  Hemoglobin on admission 10 likely related to hemoconcentration  Pneumonia cryptococcal pneumonia: Stable on CT chest Continue with Diflucan Needs to follow-up with ID  Chronic diastolic heart failure: Holding diuretics   Estimated body mass index is 32.02 kg/m as calculated from the following:   Height as of this encounter: 5' 2"  (1.575 m).   Weight as of this encounter: 79.4 kg.   DVT prophylaxis: Lovenox Code Status: Full code Family Communication: Care discussed with patient Disposition Plan:  Status is: Observation  The patient remains OBS appropriate and will d/c before 2 midnights.  Dispo: The patient is from: Home              Anticipated d/c is to: Home              Patient currently is not medically stable to d/c.   Difficult to place patient No        Consultants:   None  Procedures:   Echo pending  Antimicrobials:    Subjective: She is alert, she is feeling better she has been able to ambulate to the bathroom without any dizziness.  She denies diarrhea.  She did have diarrhea last week  Objective: Vitals:   11/09/20 1726 11/09/20 2024 11/10/20 0103 11/10/20 0509  BP: 113/84 (!) 142/80 (!) 143/76 (!) 148/71  Pulse: 85 96 82 80  Resp: 20 16 16 16   Temp: 98 F (36.7 C) 98.1 F (36.7 C) 97.8 F (36.6 C) (!) 97.5 F (36.4 C)  TempSrc: Oral  Oral Oral  SpO2: 97% 99% 97% 98%  Weight:      Height:       No intake or output data in the 24 hours ending 11/10/20 0714 Filed Weights   11/09/20 0920  Weight: 79.4 kg    Examination:  General exam: Appears calm and comfortable  Respiratory system: Clear to auscultation. Respiratory effort  normal. Cardiovascular system: S1 & S2 heard, RRR. No JVD, murmurs, rubs, gallops or clicks. No pedal edema. Gastrointestinal system: Abdomen is nondistended, soft and nontender. No organomegaly or masses felt. Normal bowel sounds heard. Central nervous system: Alert and oriented. No focal neurological deficits. Extremities: Symmetric 5 x 5 power. Skin: No rashes, lesions or ulcers   Data Reviewed: I have personally reviewed following labs and imaging studies  CBC: Recent Labs  Lab 11/09/20 0935 11/10/20 0500  WBC 10.5 9.1  HGB 10.0* 8.8*  HCT 31.3* 28.7*  MCV 95.1 98.0  PLT 538* 754*   Basic Metabolic Panel: Recent Labs  Lab 11/09/20 0935 11/09/20 1233 11/10/20 0500  NA 140  --  140  K 2.5* 2.8* 4.3  CL 103  --  106  CO2 26  --  28  GLUCOSE 125*  --  124*  BUN 12  --  10  CREATININE 0.72  --  0.73  CALCIUM 8.8*  --  8.5*  MG 1.6*  --  1.9   GFR: Estimated Creatinine Clearance: 65.7 mL/min (by C-G formula based on SCr of 0.73 mg/dL). Liver Function Tests: Recent Labs  Lab 11/09/20 0935  AST 54*  ALT 18  ALKPHOS 95  BILITOT 0.4  PROT 6.9  ALBUMIN 3.2*   No results for input(s): LIPASE, AMYLASE in the last 168 hours. No results for input(s): AMMONIA in the last 168 hours. Coagulation Profile: No results for input(s): INR, PROTIME in the last 168 hours. Cardiac Enzymes: No results for input(s): CKTOTAL, CKMB, CKMBINDEX, TROPONINI in the last 168 hours. BNP (last 3 results) No results for input(s): PROBNP in the last 8760 hours. HbA1C: No results for input(s): HGBA1C in the last 72 hours. CBG: No results for input(s): GLUCAP in the last 168 hours. Lipid Profile: No results for input(s): CHOL, HDL, LDLCALC, TRIG, CHOLHDL, LDLDIRECT in the last 72 hours. Thyroid Function Tests: No results for input(s): TSH, T4TOTAL, FREET4, T3FREE, THYROIDAB in the last 72 hours. Anemia Panel: No results for input(s): VITAMINB12, FOLATE, FERRITIN, TIBC, IRON, RETICCTPCT in  the last 72 hours. Sepsis Labs: No results for input(s): PROCALCITON, LATICACIDVEN in the last 168 hours.  Recent Results (from the past 240 hour(s))  SARS CORONAVIRUS 2 (TAT 6-24 HRS) Nasopharyngeal Nasopharyngeal Swab     Status: None   Collection Time: 11/09/20  3:35 PM   Specimen: Nasopharyngeal Swab  Result Value Ref Range Status   SARS Coronavirus 2 NEGATIVE NEGATIVE Final    Comment: (NOTE) SARS-CoV-2 target nucleic acids are NOT DETECTED.  The SARS-CoV-2 RNA is generally detectable in upper and lower respiratory specimens during the acute phase of infection. Negative results do not preclude SARS-CoV-2 infection, do not rule out co-infections with other pathogens, and should not be used as the sole basis for treatment or other patient management decisions. Negative results must be combined with clinical observations, patient history, and epidemiological information. The expected result is Negative.  Fact Sheet for Patients: SugarRoll.be  Fact Sheet for Healthcare Providers: https://www.woods-mathews.com/  This test is not yet approved or cleared by the [place] FDA and  has been authorized for detection and/or diagnosis  of SARS-CoV-2 by FDA under an Emergency Use Authorization (EUA). This EUA will remain  in effect (meaning this test can be used) for the duration of the COVID-19 declaration under Se ction 564(b)(1) of the Act, 21 U.S.C. section 360bbb-3(b)(1), unless the authorization is terminated or revoked sooner.  Performed at [facility], [place] [address], [place], [place] [address]          Radiology Studies: DG Chest 2 View  Result Date: 11/09/2020 CLINICAL DATA:  Emesis and syncope. EXAM: CHEST - 2 VIEW COMPARISON:  10/17/2020 FINDINGS: Stable cardiomediastinal contours. No pleural effusion or interstitial edema. Similar appearance known right upper lobe lung nodules. These measure up to 1.5 cm.  Previously characterized as sequelae of pulmonary fungal infection. No acute superimposed airspace consolidation. Previous right shoulder arthroplasty and ACDF within the lower cervical spine. Treated compression fractures noted within the midthoracic and upper lumbar spine. IMPRESSION: No acute cardiopulmonary abnormalities. Electronically Signed   By: Kerby Moors M.D.   On: 11/09/2020 11:44   CT HEAD WO CONTRAST  Result Date: 11/09/2020 CLINICAL DATA:  Recent syncopal episode EXAM: CT HEAD WITHOUT CONTRAST TECHNIQUE: Contiguous axial images were obtained from the base of the skull through the vertex without intravenous contrast. COMPARISON:  03/27/2020 FINDINGS: Brain: No evidence of acute infarction, hemorrhage, hydrocephalus, extra-axial collection or mass lesion/mass effect. Chronic atrophic and ischemic changes are noted. Cavum septum pellucidum is again seen and stable. Vascular: No hyperdense vessel or unexpected calcification. Skull: Normal. Negative for fracture or focal lesion. Sinuses/Orbits: No acute finding. Other: None. IMPRESSION: Chronic atrophic and ischemic changes without acute abnormality. Electronically Signed   By: Inez Catalina M.D.   On: 11/09/2020 19:26   CT ANGIO CHEST PE W OR WO CONTRAST  Result Date: 11/09/2020 CLINICAL DATA:  Recent syncopal episode with positive D-dimer EXAM: CT ANGIOGRAPHY CHEST WITH CONTRAST TECHNIQUE: Multidetector CT imaging of the chest was performed using the standard protocol during bolus administration of intravenous contrast. Multiplanar CT image reconstructions and MIPs were obtained to evaluate the vascular anatomy. CONTRAST:  58m OMNIPAQUE IOHEXOL 350 MG/ML SOLN COMPARISON:  Chest x-ray from earlier in the same day. FINDINGS: Cardiovascular: Thoracic aorta demonstrates a normal branching pattern. No aneurysmal dilatation or dissection is seen. No cardiac enlargement is noted. Mild coronary calcifications are seen. The pulmonary artery is well  visualized within normal branching pattern. No intraluminal filling defect is noted. Mediastinum/Nodes: Thoracic inlet is within normal limits. No hilar or mediastinal adenopathy is noted. The esophagus as visualized is within normal limits. Lungs/Pleura: Scattered calcified granulomas are noted in the left lung. The lungs otherwise well aerated. Right lung again demonstrates multiple upper lobe pulmonary nodules. These have decreased slightly in the interval from the prior exam. No new focal infiltrate is seen. Upper Abdomen: Changes of prior cholecystectomy. No acute abnormality is noted. Musculoskeletal: Postsurgical changes are noted in the cervical spine and right shoulder. Degenerative changes of the thoracic spine are seen. Changes of prior vertebral augmentation are noted. Healed sternal fracture is noted. Old healed rib fractures are noted on the right. Review of the MIP images confirms the above findings. IMPRESSION: No evidence of pulmonary emboli.  No thoracic aneurysm is seen. Changes consistent with prior granulomatous disease. Pulmonary nodular changes in the right upper lobe consistent with the given clinical history of prior fungal infection. They have decreased somewhat in the interval from the prior exam. No new focal infiltrate is seen. Electronically Signed   By: MLinus MakoD.  On: 11/09/2020 23:57        Scheduled Meds: . acidophilus  1 capsule Oral Daily  . aspirin EC  81 mg Oral Daily  . cholecalciferol  1,000 Units Oral Daily  . enoxaparin (LOVENOX) injection  40 mg Subcutaneous Q24H  . fluconazole  400 mg Oral Daily  . gabapentin  100 mg Oral BH-q7a  . gabapentin  400 mg Oral QHS  . loratadine  10 mg Oral Daily  . magnesium oxide  400 mg Oral Q M,W,F  . multivitamin with minerals  1 tablet Oral Daily  . pantoprazole  40 mg Oral Daily  . predniSONE  3 mg Oral Q breakfast  . Tofacitinib Citrate  5 mg Oral BID   Continuous Infusions: . lactated ringers 75 mL/hr at  11/09/20 1941     LOS: 0 days    Time spent: 35 minutes.     Elmarie Shiley, MD [facility]   If 7PM-7AM, please contact night-coverage [email or web address]  11/10/2020, 7:14 AM

## 2020-11-10 NOTE — Plan of Care (Signed)

## 2020-11-10 NOTE — Care Management Obs Status (Signed)
[facility] NOTIFICATION   Patient Details  Name: BICH MCHANEY MRN: [id number] Date of Birth: Jan 01, 1951   Medicare Observation Status Notification Given:  Yes    Shelbie Hutching, RN 11/10/2020, 10:32 AM

## 2020-11-10 NOTE — Evaluation (Signed)
Physical Therapy Evaluation Patient Details Name: Nicole Bass MRN: [id number] DOB: October 26, 1950 Today's Date: 11/10/2020   History of Present Illness  Nicole Bass is a 31yoF who [facility] c emesis, syncope. Pt found to have hypokalemia. PMH: anemia, GAD, collagen vascular disease, Crohn's, DDD (cervical, lumbar), GERD, claustrophobia, HOH (L ear), kidney stones, HTN, Meniere's disease, neuropathy (legs), vertigo, and UTI, L5/S1 TLIF 10/12/20.  Clinical Impression  Pt admitted with above diagnosis. Pt currently with functional limitations due to the deficits listed below (see "PT Problem List"). Upon entry, pt in bed, awake and agreeable to participate. The pt is alert, pleasant, interactive, and able to provide info regarding prior level of function, both in tolerance and independence. Pt able to perform all basic mobility at least as well as she ever has with our services in the past 4 weeks, reports to feel to be at her most recent baseline, has no concerns over independent mobility in the home at DC. Patient's performance this date reveals decreased ability, independence, and tolerance in performing all basic mobility required for performance of activities of daily living, but can perform all of with with modified independence, additional time, and equipment. Pt could likely transition to OPPT at DC, but I will defer that to the neurosurgical team's plan in place. Pt requires additional DME, close physical assistance, and cues for safe participate in mobility. Pt will benefit from skilled PT intervention to increase independence and safety with basic mobility in preparation for discharge to the venue listed below.       Follow Up Recommendations Follow surgeon's recommendation for DC plan and follow-up therapies;Supervision - Intermittent    Equipment Recommendations  None recommended by PT    Recommendations for Other Services       Precautions / Restrictions  Precautions Precautions: Fall;Back Restrictions Weight Bearing Restrictions: No      Mobility  Bed Mobility Overal bed mobility: Modified Independent Bed Mobility: Rolling;Sidelying to Sit Rolling: Modified independent (Device/Increase time) Sidelying to sit: Modified independent (Device/Increase time)       General bed mobility comments: aware of and demonstrates log roll and BLT express    Transfers Overall transfer level: Modified independent Equipment used: None Transfers: Sit to/from Stand Sit to Stand: Modified independent (Device/Increase time)            Ambulation/Gait Ambulation/Gait assistance: Supervision;Min guard Gait Distance (Feet): 350 Feet Assistive device: Rolling walker (2 wheeled) Gait Pattern/deviations: WFL(Within Functional Limits) Gait velocity: 0.69ms   General Gait Details: no presyncopal prodrome, no frank LOB; mild perceived unsteadiness compared to baseline  Stairs            Wheelchair Mobility    Modified Rankin (Stroke Patients Only)       Balance Overall balance assessment: Modified Independent;No apparent balance deficits (not formally assessed)                                           Pertinent Vitals/Pain Pain Assessment: No/denies pain    Home Living Family/patient expects to be discharged to:: Private residence Living Arrangements: Spouse/significant other Available Help at Discharge: Family;Available PRN/intermittently Type of Home: House Home Access: Stairs to enter Entrance Stairs-Rails: Can reach both Entrance Stairs-Number of Steps: 3 Home Layout: Multi-level;Full bath on main level;Bed/bath upstairs Home Equipment: Shower seat;Hand held shower head;Adaptive equipment;Walker - 2 wheels;Walker - 4 wheels  Prior Function Level of Independence: Needs assistance   Gait / Transfers Assistance Needed: Household distances c RW since surgery for 4 weeks, unable to progress with HHPT  due to 3 hospitalizatios since then.  ADL's / Homemaking Assistance Needed: has recently been modI, but was supervision for a coupe weeks post surgery.        Hand Dominance   Dominant Hand: Right    Extremity/Trunk Assessment   Upper Extremity Assessment Upper Extremity Assessment: Overall WFL for tasks assessed    Lower Extremity Assessment Lower Extremity Assessment: Overall WFL for tasks assessed    Cervical / Trunk Assessment Cervical / Trunk Assessment: Normal  Communication   Communication: No difficulties  Cognition Arousal/Alertness: Awake/alert Behavior During Therapy: WFL for tasks assessed/performed Overall Cognitive Status: Within Functional Limits for tasks assessed                                        General Comments      Exercises     Assessment/Plan    PT Assessment Patient needs continued PT services  PT Problem List Decreased strength;Decreased activity tolerance;Decreased mobility;Decreased knowledge of use of DME;Decreased safety awareness;Pain;Decreased knowledge of precautions;Decreased balance       PT Treatment Interventions DME instruction;Gait training;Stair training;Functional mobility training;Therapeutic activities;Therapeutic exercise;Balance training;Neuromuscular re-education;Patient/family education    PT Goals (Current goals can be found in the Care Plan section)  Acute Rehab PT Goals Patient Stated Goal: return to home to progress mobility PT Goal Formulation: With patient Time For Goal Achievement: 11/24/20 Potential to Achieve Goals: Good    Frequency Min 2X/week   Barriers to discharge        Co-evaluation               AM-PAC PT "6 Clicks" Mobility  Outcome Measure Help needed turning from your back to your side while in a flat bed without using bedrails?: None Help needed moving from lying on your back to sitting on the side of a flat bed without using bedrails?: None Help needed moving  to and from a bed to a chair (including a wheelchair)?: None Help needed standing up from a chair using your arms (e.g., wheelchair or bedside chair)?: None Help needed to walk in hospital room?: A Little Help needed climbing 3-5 steps with a railing? : A Little 6 Click Score: 22    End of Session Equipment Utilized During Treatment: Gait belt Activity Tolerance: Patient tolerated treatment well;No increased pain;Patient limited by fatigue Patient left: with call bell/phone within reach;in bed Nurse Communication: Mobility status PT Visit Diagnosis: Muscle weakness (generalized) (M62.81);Difficulty in walking, not elsewhere classified (R26.2)    Time: 5038-8828 PT Time Calculation (min) (ACUTE ONLY): 28 min   Charges:   PT Evaluation $PT Eval Moderate Complexity: 1 Mod PT Treatments $Therapeutic Exercise: 8-22 mins        4:00 PM, 11/10/20 Etta Grandchild, PT, DPT Physical Therapist - [facility]  [phone] ([place])   Dexter C 11/10/2020, 3:56 PM

## 2020-11-10 NOTE — TOC Initial Note (Signed)
Transition of Care [facility]) - Initial/Assessment Note    Patient Details  Name: Nicole Bass MRN: [id number] Date of Birth: 1951-05-20  Transition of Care [facility]) CM/SW Contact:    Shelbie Hutching, RN Phone Number: 11/10/2020, 3:24 PM  Clinical Narrative:                 Patient placed under observation for syncopal episode.  RNCM met with patient at the bedside.  Patient reports that ever since she had back surgery she has been in and out of the hospital and she reports that they are not sure what is causing her to be sick.   Patient lives with her husband and uses a walker at home.  Patient was previously set up with [facility] for home health services but she reports that ended last week.  Patient is not sure that she needs home health when discharged this time.  Patient is current with PCP Dr. Doy Hutching.  TOC will cont to follow and assist with any needs.   Expected Discharge Plan: Home/Self Care Barriers to Discharge: Continued Medical Work up   Patient Goals and CMS Choice Patient states their goals for this hospitalization and ongoing recovery are:: Wants to find out what is wrong with her and why she keeps getting sick      Expected Discharge Plan and Services Expected Discharge Plan: Home/Self Care   Discharge Planning Services: CM Consult   Living arrangements for the past 2 months: Single Family Home                                      Prior Living Arrangements/Services Living arrangements for the past 2 months: Single Family Home Lives with:: Spouse Patient language and need for interpreter reviewed:: Yes Do you feel safe going back to the place where you live?: Yes      Need for Family Participation in Patient Care: Yes (Comment) (syncopal episode) Care giver support system in place?: Yes (comment) (husband and daughter) Current home services: DME (walker) Criminal Activity/Legal Involvement Pertinent to Current Situation/Hospitalization: No - Comment as  needed  Activities of Daily Living Home Assistive Devices/Equipment: Walker (specify type),Shower chair without back ADL Screening (condition at time of admission) Patient's cognitive ability adequate to safely complete daily activities?: Yes Is the patient deaf or have difficulty hearing?: No Does the patient have difficulty seeing, even when wearing glasses/contacts?: No Does the patient have difficulty concentrating, remembering, or making decisions?: No Patient able to express need for assistance with ADLs?: Yes Does the patient have difficulty dressing or bathing?: Yes Independently performs ADLs?: Yes (appropriate for developmental age) Does the patient have difficulty walking or climbing stairs?: No Weakness of Legs: None Weakness of Arms/Hands: None  Permission Sought/Granted Permission sought to share information with : Case Manager,Family Supports Permission granted to share information with : Yes, Verbal Permission Granted  Share Information with NAME: Ronalee Belts and [place] granted to share info w Relationship: husband and daughter     Emotional Assessment Appearance:: Appears stated age Attitude/Demeanor/Rapport: Engaged Affect (typically observed): Accepting Orientation: : Oriented to Self,Oriented to Place,Oriented to  Time,Oriented to Situation Alcohol / Substance Use: Not Applicable Psych Involvement: No (comment)  Admission diagnosis:  Syncope and collapse [R55] Dehydration [E86.0] Hypokalemia [E87.6] Hypomagnesemia [E83.42] Syncope [R55] Non-intractable vomiting with nausea, unspecified vomiting type [R11.2] Patient Active Problem List   Diagnosis Date Noted  .  Syncope 11/09/2020  . Normocytic anemia 11/09/2020  . Hypomagnesemia 11/09/2020  . 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 Chronic diastolic CHF (congestive heart failure) ([place]) 11/09/2020  . Fusion of lumbar spine 10/17/2020  . Lumbar stenosis 10/12/2020  . Swelling of limb  06/02/2020  . Cervical spondylosis 05/01/2020  . Atherosclerosis of native coronary artery of native heart without angina pectoris 04/27/2020  . Numbness and tingling in left hand 10/29/2019  . 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 Status post right partial knee replacement 02/12/2019  . Rotator cuff arthropathy, right 01/25/2019  . Iron deficiency anemia 09/13/2018  . Anemia, unspecified 09/09/2018  . Iron deficiency 09/04/2018  . Acute kidney injury ([place]) 08/23/2018  . HTN, goal below 140/80 06/04/2018  . Hyperthyroidism 02/08/2018  . Imbalance 12/18/2017  . Stomatitis, ulcerative 09/06/2017  . Dyspareunia in female 08/22/2017  . Vaginal atrophy 08/22/2017  . Obesity (BMI 30.0-34.9) 08/22/2017  . Varicose veins of leg with pain, left 04/05/2017  . Peripheral polyneuropathy 04/05/2017  . Lumbar radiculopathy 04/05/2017  . Tremor 02/13/2017  . Sensory neuropathy 02/13/2017  . Restless leg syndrome 02/13/2017  . Impingement syndrome of left shoulder 09/29/2016  . Acute diarrhea 09/06/2016  . History of pyelonephritis 09/06/2016  . Hypokalemia 09/06/2016  . 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 Pyelonephritis 08/29/2016  . Rheumatoid arthritis, unspecified ([place]) 08/29/2016  . Nontraumatic complete tear of right rotator cuff 03/08/2016  . Acute pain of right shoulder 01/27/2016  . Type 2 diabetes mellitus with diabetic neuropathy ([place]) 10/27/2015  . Closed fracture of distal end of right radius with routine healing 09/17/2015  . Crohn's colitis ([place]) 09/04/2015  . Acute bilateral low back pain without sciatica 03/23/2015  . SOB (shortness of breath) on exertion 01/15/2015  . Pedal edema 01/15/2015  . Status post open reduction with internal fixation of fracture 11/27/2014  . Aftercare for healing traumatic fracture of lower arm 09/25/2014  . Painful rib 09/11/2014  . Primary osteoarthritis of left knee 04/24/2014  . Osteoporosis  03/17/2014  . Depression with anxiety 03/17/2014  . B12 deficiency 03/17/2014  . Crohn's disease, unspecified, without complications ([place]) 22/63/3354  . Urolith 07/16/2013  . Right flank pain 07/16/2013  . Abdominal pain 07/16/2013   PCP:  [name], MD Pharmacy:   [facility] [place], [place] - [facility] [address] AT [place] [address] [facility] [place] [address] Phone: [phone] Fax: [place] [id number] [place], [place] - [address] [place] [address] Phone: [phone] Fax: [phone]     Social Determinants of Health (SDOH) Interventions    Readmission Risk Interventions No flowsheet data found.

## 2020-11-10 NOTE — Progress Notes (Signed)
Patient alert and oriented, complained of pain x1, PRN given with improvement. Will continue to monitor.

## 2020-11-11 ENCOUNTER — Observation Stay
Admit: 2020-11-11 | Discharge: 2020-11-11 | Disposition: A | Discharge status: Home / Self Care | Payer: Medicare Other | Attending: Internal Medicine | Admitting: Internal Medicine

## 2020-11-11 DIAGNOSIS — Z20822 Contact with and (suspected) exposure to covid-19: Secondary | ICD-10-CM | POA: Diagnosis present

## 2020-11-11 DIAGNOSIS — K50119 Crohn's disease of large intestine with unspecified complications: Secondary | ICD-10-CM | POA: Diagnosis not present

## 2020-11-11 DIAGNOSIS — M069 Rheumatoid arthritis, unspecified: Secondary | ICD-10-CM | POA: Diagnosis present

## 2020-11-11 DIAGNOSIS — E875 Hyperkalemia: Secondary | ICD-10-CM | POA: Diagnosis present

## 2020-11-11 DIAGNOSIS — I5032 Chronic diastolic (congestive) heart failure: Secondary | ICD-10-CM | POA: Diagnosis present

## 2020-11-11 DIAGNOSIS — K5010 Crohn's disease of large intestine without complications: Secondary | ICD-10-CM | POA: Diagnosis present

## 2020-11-11 DIAGNOSIS — F40240 Claustrophobia: Secondary | ICD-10-CM | POA: Diagnosis present

## 2020-11-11 DIAGNOSIS — E860 Dehydration: Secondary | ICD-10-CM

## 2020-11-11 DIAGNOSIS — E039 Hypothyroidism, unspecified: Secondary | ICD-10-CM | POA: Diagnosis present

## 2020-11-11 DIAGNOSIS — K219 Gastro-esophageal reflux disease without esophagitis: Secondary | ICD-10-CM | POA: Diagnosis present

## 2020-11-11 DIAGNOSIS — Z882 Allergy status to sulfonamides status: Secondary | ICD-10-CM | POA: Diagnosis not present

## 2020-11-11 DIAGNOSIS — I110 Hypertensive heart disease with heart failure: Secondary | ICD-10-CM | POA: Diagnosis present

## 2020-11-11 DIAGNOSIS — I2510 Atherosclerotic heart disease of native coronary artery without angina pectoris: Secondary | ICD-10-CM | POA: Diagnosis present

## 2020-11-11 DIAGNOSIS — D649 Anemia, unspecified: Secondary | ICD-10-CM | POA: Diagnosis present

## 2020-11-11 DIAGNOSIS — F32A Depression, unspecified: Secondary | ICD-10-CM | POA: Diagnosis present

## 2020-11-11 DIAGNOSIS — Z888 Allergy status to other drugs, medicaments and biological substances status: Secondary | ICD-10-CM | POA: Diagnosis not present

## 2020-11-11 DIAGNOSIS — E876 Hypokalemia: Secondary | ICD-10-CM | POA: Diagnosis not present

## 2020-11-11 DIAGNOSIS — R55 Syncope and collapse: Secondary | ICD-10-CM | POA: Diagnosis present

## 2020-11-11 DIAGNOSIS — E1142 Type 2 diabetes mellitus with diabetic polyneuropathy: Secondary | ICD-10-CM | POA: Diagnosis present

## 2020-11-11 DIAGNOSIS — B450 Pulmonary cryptococcosis: Secondary | ICD-10-CM | POA: Diagnosis present

## 2020-11-11 DIAGNOSIS — Z96611 Presence of right artificial shoulder joint: Secondary | ICD-10-CM | POA: Diagnosis present

## 2020-11-11 DIAGNOSIS — J189 Pneumonia, unspecified organism: Secondary | ICD-10-CM | POA: Diagnosis present

## 2020-11-11 DIAGNOSIS — Z96651 Presence of right artificial knee joint: Secondary | ICD-10-CM | POA: Diagnosis present

## 2020-11-11 DIAGNOSIS — F419 Anxiety disorder, unspecified: Secondary | ICD-10-CM | POA: Diagnosis present

## 2020-11-11 LAB — ECHOCARDIOGRAM COMPLETE
AR max vel: 3.32 cm2
AV Area VTI: 3.78 cm2
AV Area mean vel: 3.66 cm2
AV Mean grad: 2 mmHg
AV Peak grad: 5.1 mmHg
Ao pk vel: 1.13 m/s
Area-P 1/2: 5.97 cm2
Height: 62 in
MV VTI: 4.09 cm2
S' Lateral: 3 cm
Weight: 2800.72 oz

## 2020-11-11 LAB — BASIC METABOLIC PANEL
Anion gap: 6 (ref 5–15)
BUN: 9 mg/dL (ref 8–23)
CO2: 29 mmol/L (ref 22–32)
Calcium: 8.4 mg/dL — ABNORMAL LOW (ref 8.9–10.3)
Chloride: 106 mmol/L (ref 98–111)
Creatinine, Ser: 0.71 mg/dL (ref 0.44–1.00)
GFR, Estimated: >60 mL/min (ref >60)
Glucose, Bld: 92 mg/dL (ref 70–99)
Potassium: 4 mmol/L (ref 3.5–5.1)
Sodium: 141 mmol/L (ref 135–145)

## 2020-11-11 LAB — CBC
HCT: 27.2 % — ABNORMAL LOW (ref 36.0–46.0)
Hemoglobin: 8.4 g/dL — ABNORMAL LOW (ref 12.0–15.0)
MCH: 30.4 pg (ref 26.0–34.0)
MCHC: 30.9 g/dL (ref 30.0–36.0)
MCV: 98.6 fL (ref 80.0–100.0)
Platelets: 340 10*3/uL (ref 150–400)
RBC: 2.76 MIL/uL — ABNORMAL LOW (ref 3.87–5.11)
RDW: 15.4 % (ref 11.5–15.5)
WBC: 5.8 10*3/uL (ref 4.0–10.5)
nRBC: 0 % (ref 0.0–0.2)

## 2020-11-11 LAB — CORTISOL: Cortisol, Plasma: 13.2 ug/dL

## 2020-11-11 MED ORDER — SODIUM CHLORIDE 0.9 % IV SOLN: INTRAVENOUS | Status: DC

## 2020-11-11 NOTE — Progress Notes (Signed)
Physical Therapy Treatment Patient Details Name: Nicole Bass MRN: [id number] DOB: November 28, 1950 Today's Date: 11/11/2020    History of Present Illness Nicole Bass is a 61yoF who [facility] on 3/28 c emesis, syncope. Pt found to have hypokalemia. PMH: anemia, GAD, collagen vascular disease, Crohn's, DDD (cervical, lumbar), GERD, claustrophobia, HOH (L ear), kidney stones, HTN, Meniere's disease, neuropathy (legs), vertigo, and UTI, L5/S1 TLIF 10/12/20.    PT Comments    Pt in bed upon entry, reports momentary dizziness earlier when getting back into bed, less than 3 sec. RN recently performed a seated and standing BP, vitals flat per RN. Pt progresses to 4 laps around unit today, gait speed unchanged compared to previous day. Pt has no presyncopal prodrome. Pt holds conversation the entire time. Discussed post DC recommendations for OPPT however will wait to see what the plan is from Dr.Cook's office.        Follow Up Recommendations  Follow surgeon's recommendation for DC plan and follow-up therapies;Supervision - Intermittent (does not need HHPT at this point; would benefit from OPPT; unclear what the postoperative plan is with neurosurgery)     Equipment Recommendations  None recommended by PT    Recommendations for Other Services       Precautions / Restrictions Precautions Precautions: Fall;Back Restrictions Weight Bearing Restrictions: No    Mobility  Bed Mobility Overal bed mobility: Modified Independent Bed Mobility: Rolling;Sidelying to Sit Rolling: Modified independent (Device/Increase time) Sidelying to sit: Modified independent (Device/Increase time)       General bed mobility comments: aware of and demonstrates log roll and BLT express    Transfers Overall transfer level: Modified independent Equipment used: None Transfers: Sit to/from Stand Sit to Stand: Modified independent (Device/Increase time)         General transfer comment: fairly strong  stand from bed and mat table up to RW; controlled descent sitting down  Ambulation/Gait Ambulation/Gait assistance: Supervision Gait Distance (Feet): 670 Feet Assistive device: Rolling walker (2 wheeled) Gait Pattern/deviations: WFL(Within Functional Limits) Gait velocity: 0.33ms today; 0.779m yesterday   General Gait Details: no presyncopal prodrome, no frank LOB; mild perceived unsteadiness compared to baseline   Stairs             Wheelchair Mobility    Modified Rankin (Stroke Patients Only)       Balance Overall balance assessment: Modified Independent;No apparent balance deficits (not formally assessed)                                          Cognition Arousal/Alertness: Awake/alert Behavior During Therapy: WFL for tasks assessed/performed Overall Cognitive Status: Within Functional Limits for tasks assessed                                        Exercises      General Comments        Pertinent Vitals/Pain Pain Assessment: No/denies pain    Home Living                      Prior Function            PT Goals (current goals can now be found in the care plan section) Acute Rehab PT Goals Patient Stated Goal: return to home to progress mobility PT Goal  Formulation: With patient Time For Goal Achievement: 11/24/20 Potential to Achieve Goals: Good Progress towards PT goals: Progressing toward goals    Frequency    Min 2X/week      PT Plan Current plan remains appropriate    Co-evaluation              AM-PAC PT "6 Clicks" Mobility   Outcome Measure  Help needed turning from your back to your side while in a flat bed without using bedrails?: None Help needed moving from lying on your back to sitting on the side of a flat bed without using bedrails?: None Help needed moving to and from a bed to a chair (including a wheelchair)?: None Help needed standing up from a chair using your arms  (e.g., wheelchair or bedside chair)?: None Help needed to walk in hospital room?: A Little Help needed climbing 3-5 steps with a railing? : A Little 6 Click Score: 22    End of Session Equipment Utilized During Treatment: Gait belt Activity Tolerance: Patient tolerated treatment well;No increased pain;Patient limited by fatigue Patient left: with call bell/phone within reach;in bed;Other (comment) (seated EOB) Nurse Communication: Mobility status PT Visit Diagnosis: Muscle weakness (generalized) (M62.81);Difficulty in walking, not elsewhere classified (R26.2)     Time: 1437-1500 PT Time Calculation (min) (ACUTE ONLY): 23 min  Charges:  $Therapeutic Exercise: 23-37 mins                     3:22 PM, 11/11/20 Etta Grandchild, PT, DPT Physical Therapist - [facility]  [phone] ([place])     [place] C 11/11/2020, 3:17 PM

## 2020-11-11 NOTE — Progress Notes (Signed)
*  PRELIMINARY RESULTS* Echocardiogram 2D Echocardiogram has been performed.  Wallie Char Kiah Keay 11/11/2020, 10:01 AM

## 2020-11-11 NOTE — TOC Progression Note (Signed)
Transition of Care [facility]) - Progression Note    Patient Details  Name: LASHAUNDA SCHILD MRN: [id number] Date of Birth: November 10, 1950  Transition of Care [facility]) CM/SW [employer], RN Phone Number: 11/11/2020, 12:58 PM  Clinical Narrative:    [name] to [facility] @ [phone] at the request of the patient to discuss observation status. Spouse states he was told patient was being admitted and the treatment team never mentioned observation status or he would have taken patient elsewhere as they have had other observation admissions and it is very expensive for them. TOC supervisor attempted to explain process and guidelines for observation however spouse continued to report he was going to have his attorney review the letter. Writer offered to contact attending and request a call be placed to spouse to discuss observation as well. Spouse also reports that he is going to call his PCP, Dr. Doy Hutching to discuss further. Writer agreed and confirmed spouse with no other questions at this time.   Outreached via secure chat to attending Dr. Darrick Meigs, Frederich Chick requesting call be placed to spouse to discuss observation criteria further.    Expected Discharge Plan: Home/Self Care Barriers to Discharge: Continued Medical Work up  Expected Discharge Plan and Services Expected Discharge Plan: Home/Self Care   Discharge Planning Services: CM Consult   Living arrangements for the past 2 months: Single Family Home                                       Social Determinants of Health (SDOH) Interventions    Readmission Risk Interventions No flowsheet data found.

## 2020-11-11 NOTE — Progress Notes (Signed)
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 Patient underwent back surgery on L5-S1 TL IVF 10/12/2020.  Patient went to GI clinic for follow-up visit and felt dizzy and lightheaded.  Per patient's husband she passed out twice.  There was no seizure-like activity.  No focal neurological deficit. She was found to have potassium of 2.8, magnesium 1.6, WBC 10.5.  D-dimer was elevated CTA was negative for PE.  CT chest showed prior granulomatous disease, pulmonary nodular changes in the right upper lobe consistent with prior fungal infection.  No new focal infiltrate seen.   Subjective   Patient seen and examined, denies any complaints.  No chest pain or shortness of breath.  No further syncopal episodes in the hospital.   Assessment/Plan:     1. Syncope-unclear etiology, D-dimer was positive, CTA chest was negative for PE.  2D echo is unremarkable.  CT head showed no acute abnormality.  Patient has been taking prednisone 3 mg daily for past 10 years, random serum cortisol level obtained this morning is 13.2.  Patient has seen cardiology as outpatient, likely will need Holter monitoring versus event monitoring as outpatient.  Will discuss with cardiology in a.m. 2. Hypomagnesemia-patient's magnesium was 1.6, magnesium was replaced.  Repeat magnesium is 1.9. 3. Hypokalemia-potassium was 2.5 on presentation, potassium replaced.  Repeat potassium this morning is 4.3. 4. Hypertension-blood pressure is stable 5. Crohn's colitis/rheumatoid arthritis-continue prednisone,Tofacitinib. 6. Cryptococcal  pneumonia-stable on CT chest, continue Diflucan.  Patient to follow-up with ID as outpatient. 7. Anemia-hemoglobin is 8.4, dropped from 10.0, 2 days ago.  Likely dilutional.  Serum iron 127, transferrin 190.2, ferritin 1500, saturation 48%.  Check FOBT.  Follow CBC in a.m.    Scheduled medications:   . acidophilus  1 capsule Oral Daily  . aspirin EC  81 mg Oral Daily  . cholecalciferol  1,000 Units Oral Daily  . enoxaparin (LOVENOX) injection  40 mg Subcutaneous Q24H  . fluconazole  400 mg Oral Daily  . gabapentin  100 mg Oral BH-q7a  . gabapentin  400 mg Oral QHS  . loratadine  10 mg Oral Daily  . magnesium oxide  400 mg Oral Q M,W,F  . multivitamin with minerals  1 tablet Oral Daily  . pantoprazole  40 mg Oral Daily  . predniSONE  3 mg Oral Q breakfast  . Tofacitinib Citrate  5 mg Oral BID         Data Reviewed:   CBG:  No results for input(s): GLUCAP in the last 168 hours.  SpO2: 99 %    Vitals:   11/11/20 0838 11/11/20 1229 11/11/20 1428 11/11/20 1627  BP: 134/61 (!) 142/74 (!) 149/80 (!) 142/69  Pulse: 76 81 79 88  Resp: 17 18  18   Temp: 98.3 F (36.8 C) 98.3 F (36.8 C)  99.2 F (37.3 C)  TempSrc:  Oral  Oral  SpO2: 98% 100%  99%  Weight:      Height:         Intake/Output Summary (Last 24 hours) at 11/11/2020 1748 Last data filed at 11/11/2020 1358 Gross per 24 hour  Intake 2012.28 ml  Output --  Net 2012.28 ml    03/28 1901 -  03/30 0700 In: 1372.3 [P.O.:480; I.V.:892.3] Out: -   Filed Weights   11/09/20 0920  Weight: 79.4 kg    CBC:  Recent Labs  Lab 11/09/20 0935 11/10/20 0500 11/11/20 0629  WBC 10.5 9.1 5.8  HGB 10.0* 8.8* 8.4*  HCT 31.3* 28.7* 27.2*  PLT 538* 426* 340  MCV 95.1 98.0 98.6  MCH 30.4 30.0 30.4  MCHC 31.9 30.7 30.9  RDW 15.0 15.2 15.4    Complete metabolic panel:  Recent Labs  Lab 11/09/20 0925 11/09/20 0935 11/09/20 1233 11/09/20 1942 11/10/20 0500 11/11/20 0629  NA  --  140  --   --  140 141  K  --   2.5* 2.8*  --  4.3 4.0  CL  --  103  --   --  106 106  CO2  --  26  --   --  28 29  GLUCOSE  --  125*  --   --  124* 92  BUN  --  12  --   --  10 9  CREATININE  --  0.72  --   --  0.73 0.71  CALCIUM  --  8.8*  --   --  8.5* 8.4*  AST  --  54*  --   --   --   --   ALT  --  18  --   --   --   --   ALKPHOS  --  95  --   --   --   --   BILITOT  --  0.4  --   --   --   --   ALBUMIN  --  3.2*  --   --   --   --   MG  --  1.6*  --   --  1.9  --   DDIMER  --   --   --  3.46*  --   --   BNP 94.0  --   --   --   --   --     No results for input(s): LIPASE, AMYLASE in the last 168 hours.  Recent Labs  Lab 11/09/20 0925 11/09/20 1535 11/09/20 1942  DDIMER  --   --  3.46*  BNP 94.0  --   --   SARSCOV2NAA  --  NEGATIVE  --     ------------------------------------------------------------------------------------------------------------------ No results for input(s): CHOL, HDL, LDLCALC, TRIG, CHOLHDL, LDLDIRECT in the last 72 hours.  No results found for: HGBA1C ------------------------------------------------------------------------------------------------------------------ No results for input(s): TSH, T4TOTAL, T3FREE, THYROIDAB in the last 72 hours.  Invalid input(s): FREET3 ------------------------------------------------------------------------------------------------------------------ No results for input(s): VITAMINB12, FOLATE, FERRITIN, TIBC, IRON, RETICCTPCT in the last 72 hours.  Coagulation profile  No results for input(s): INR, PROTIME in the last 168 hours.  Recent Labs    11/09/20 1942  DDIMER 3.46*    Cardiac Enzymes  No results for input(s): CKMB, TROPONINI, MYOGLOBIN in the last 168 hours.  Invalid input(s): CK ------------------------------------------------------------------------------------------------------------------    Component Value Date/Time   BNP 94.0 11/09/2020 0925     Antibiotics: Anti-infectives (From admission, onward)   Start      Dose/Rate Route Frequency Ordered Stop   11/09/20 1615  fluconazole (DIFLUCAN) tablet 400 mg        400 mg Oral Daily 11/09/20 1607         Radiology Reports  CT HEAD WO CONTRAST  Result Date: 11/09/2020 CLINICAL DATA:  Recent syncopal episode EXAM: CT HEAD WITHOUT CONTRAST TECHNIQUE: Contiguous  axial images were obtained from the base of the skull through the vertex without intravenous contrast. COMPARISON:  03/27/2020 FINDINGS: Brain: No evidence of acute infarction, hemorrhage, hydrocephalus, extra-axial collection or mass lesion/mass effect. Chronic atrophic and ischemic changes are noted. Cavum septum pellucidum is again seen and stable. Vascular: No hyperdense vessel or unexpected calcification. Skull: Normal. Negative for fracture or focal lesion. Sinuses/Orbits: No acute finding. Other: None. IMPRESSION: Chronic atrophic and ischemic changes without acute abnormality. Electronically Signed   By: Inez Catalina M.D.   On: 11/09/2020 19:26   CT ANGIO CHEST PE W OR WO CONTRAST  Result Date: 11/09/2020 CLINICAL DATA:  Recent syncopal episode with positive D-dimer EXAM: CT ANGIOGRAPHY CHEST WITH CONTRAST TECHNIQUE: Multidetector CT imaging of the chest was performed using the standard protocol during bolus administration of intravenous contrast. Multiplanar CT image reconstructions and MIPs were obtained to evaluate the vascular anatomy. CONTRAST:  55m OMNIPAQUE IOHEXOL 350 MG/ML SOLN COMPARISON:  Chest x-ray from earlier in the same day. FINDINGS: Cardiovascular: Thoracic aorta demonstrates a normal branching pattern. No aneurysmal dilatation or dissection is seen. No cardiac enlargement is noted. Mild coronary calcifications are seen. The pulmonary artery is well visualized within normal branching pattern. No intraluminal filling defect is noted. Mediastinum/Nodes: Thoracic inlet is within normal limits. No hilar or mediastinal adenopathy is noted. The esophagus as visualized is within normal  limits. Lungs/Pleura: Scattered calcified granulomas are noted in the left lung. The lungs otherwise well aerated. Right lung again demonstrates multiple upper lobe pulmonary nodules. These have decreased slightly in the interval from the prior exam. No new focal infiltrate is seen. Upper Abdomen: Changes of prior cholecystectomy. No acute abnormality is noted. Musculoskeletal: Postsurgical changes are noted in the cervical spine and right shoulder. Degenerative changes of the thoracic spine are seen. Changes of prior vertebral augmentation are noted. Healed sternal fracture is noted. Old healed rib fractures are noted on the right. Review of the MIP images confirms the above findings. IMPRESSION: No evidence of pulmonary emboli.  No thoracic aneurysm is seen. Changes consistent with prior granulomatous disease. Pulmonary nodular changes in the right upper lobe consistent with the given clinical history of prior fungal infection. They have decreased somewhat in the interval from the prior exam. No new focal infiltrate is seen. Electronically Signed   By: MInez CatalinaM.D.   On: 11/09/2020 23:57   [place]Venous Img Lower Bilateral (DVT)  Result Date: 11/10/2020 CLINICAL DATA:  70year old female history of positive D-dimer EXAM: BILATERAL LOWER EXTREMITY VENOUS DOPPLER ULTRASOUND TECHNIQUE: Gray-scale sonography with graded compression, as well as color Doppler and duplex ultrasound were performed to evaluate the lower extremity deep venous systems from the level of the common femoral vein and including the common femoral, femoral, profunda femoral, popliteal and calf veins including the posterior tibial, peroneal and gastrocnemius veins when visible. The superficial great saphenous vein was also interrogated. Spectral Doppler was utilized to evaluate flow at rest and with distal augmentation maneuvers in the common femoral, femoral and popliteal veins. COMPARISON:  None. FINDINGS: RIGHT LOWER EXTREMITY Common  Femoral Vein: No evidence of thrombus. Normal compressibility, respiratory phasicity and response to augmentation. Saphenofemoral Junction: No evidence of thrombus. Normal compressibility and flow on color Doppler imaging. Profunda Femoral Vein: No evidence of thrombus. Normal compressibility and flow on color Doppler imaging. Femoral Vein: No evidence of thrombus. Normal compressibility, respiratory phasicity and response to augmentation. Popliteal Vein: No evidence of thrombus. Normal compressibility, respiratory phasicity and response to augmentation. Calf Veins:  No evidence of thrombus. Normal compressibility and flow on color Doppler imaging. Superficial Great Saphenous Vein: No evidence of thrombus. Normal compressibility and flow on color Doppler imaging. Other Findings:  None. LEFT LOWER EXTREMITY Common Femoral Vein: No evidence of thrombus. Normal compressibility, respiratory phasicity and response to augmentation. Saphenofemoral Junction: No evidence of thrombus. Normal compressibility and flow on color Doppler imaging. Profunda Femoral Vein: No evidence of thrombus. Normal compressibility and flow on color Doppler imaging. Femoral Vein: No evidence of thrombus. Normal compressibility, respiratory phasicity and response to augmentation. Popliteal Vein: No evidence of thrombus. Normal compressibility, respiratory phasicity and response to augmentation. Calf Veins: No evidence of thrombus. Normal compressibility and flow on color Doppler imaging. Superficial Great Saphenous Vein: No evidence of thrombus. Normal compressibility and flow on color Doppler imaging. Other Findings:  None. IMPRESSION: Sonographic survey of the bilateral lower extremities negative for DVT Electronically Signed   By: Corrie Mckusick D.O.   On: 11/10/2020 10:25   ECHOCARDIOGRAM COMPLETE  Result Date: 11/11/2020    ECHOCARDIOGRAM REPORT   Patient Name:   REMONIA OTTE Date of Exam: 11/11/2020 Medical Rec #:  [id number]          Height:       62.0 in Accession #:    [id number]        Weight:       175.0 lb Date of Birth:  07-01-1951         BSA:          1.806 m Patient Age:    43 years          BP:           140/72 mmHg Patient Gender: F                 HR:           80 bpm. Exam Location:  [facility] Procedure: 2D Echo, Color Doppler and Cardiac Doppler Indications:     R55 Syncope  History:         Patient has prior history of Echocardiogram examinations. Risk                  Factors:Hypertension.  Sonographer:     Charmayne Sheer RDCS (AE) Referring Phys:  [id number] Soledad Gerlach NIU Diagnosing Phys: Serafina Royals MD IMPRESSIONS  1. Left ventricular ejection fraction, by estimation, is 50 to 55%. The left ventricle has low normal function. The left ventricle has no regional wall motion abnormalities. Left ventricular diastolic parameters were normal.  2. Right ventricular systolic function is normal. The right ventricular size is normal.  3. The mitral valve is normal in structure. Trivial mitral valve regurgitation.  4. The aortic valve is normal in structure. Aortic valve regurgitation is not visualized. FINDINGS  Left Ventricle: Left ventricular ejection fraction, by estimation, is 50 to 55%. The left ventricle has low normal function. The left ventricle has no regional wall motion abnormalities. The left ventricular internal cavity size was normal in size. There is no left ventricular hypertrophy. Left ventricular diastolic parameters were normal. Right Ventricle: The right ventricular size is normal. No increase in right ventricular wall thickness. Right ventricular systolic function is normal. Left Atrium: Left atrial size was normal in size. Right Atrium: Right atrial size was normal in size. Pericardium: There is no evidence of pericardial effusion. Mitral Valve: The mitral valve is normal in structure. Trivial mitral valve regurgitation. MV peak gradient, 4.2 mmHg. The mean mitral valve gradient is 2.0  mmHg. Tricuspid Valve: The tricuspid valve is  normal in structure. Tricuspid valve regurgitation is trivial. Aortic Valve: The aortic valve is normal in structure. Aortic valve regurgitation is not visualized. Aortic valve mean gradient measures 2.0 mmHg. Aortic valve peak gradient measures 5.1 mmHg. Aortic valve area, by VTI measures 3.78 cm. Pulmonic Valve: The pulmonic valve was normal in structure. Pulmonic valve regurgitation is trivial. Aorta: The aortic root and ascending aorta are structurally normal, with no evidence of dilitation. IAS/Shunts: No atrial level shunt detected by color flow Doppler.  LEFT VENTRICLE PLAX 2D LVIDd:         4.30 cm  Diastology LVIDs:         3.00 cm  LV e' medial:    6.64 cm/s LV PW:         1.10 cm  LV E/e' medial:  10.5 LV IVS:        1.10 cm  LV e' lateral:   5.98 cm/s LVOT diam:     2.40 cm  LV E/e' lateral: 11.7 LV SV:         78 LV SV Index:   43 LVOT Area:     4.52 cm  RIGHT VENTRICLE RV Basal diam:  3.60 cm LEFT ATRIUM             Index LA diam:        4.00 cm 2.21 cm/m LA Vol (A2C):   48.5 ml 26.85 ml/m LA Vol (A4C):   55.4 ml 30.67 ml/m LA Biplane Vol: 52.1 ml 28.84 ml/m  AORTIC VALVE                   PULMONIC VALVE AV Area (Vmax):    3.32 cm    PV Vmax:       0.92 m/s AV Area (Vmean):   3.66 cm    PV Vmean:      65.100 cm/s AV Area (VTI):     3.78 cm    PV VTI:        0.152 m AV Vmax:           113.00 cm/s PV Peak grad:  3.4 mmHg AV Vmean:          73.900 cm/s PV Mean grad:  2.0 mmHg AV VTI:            0.207 m AV Peak Grad:      5.1 mmHg AV Mean Grad:      2.0 mmHg LVOT Vmax:         82.90 cm/s LVOT Vmean:        59.800 cm/s LVOT VTI:          0.173 m LVOT/AV VTI ratio: 0.84  AORTA Ao Root diam: 3.10 cm MITRAL VALVE MV Area (PHT): 5.97 cm    SHUNTS MV Area VTI:   4.09 cm    Systemic VTI:  0.17 m MV Peak grad:  4.2 mmHg    Systemic Diam: 2.40 cm MV Mean grad:  2.0 mmHg MV Vmax:       1.03 m/s MV Vmean:      65.3 cm/s MV Decel Time: 127 msec MV E velocity: 69.80 cm/s MV A velocity: 84.80 cm/s MV E/A  ratio:  0.82 Serafina Royals MD Electronically signed by Serafina Royals MD Signature Date/Time: 11/11/2020/5:18:18 PM    Final       DVT prophylaxis: Lovenox  Code Status: Full code  Family Communication: No family at bedside   Consultants:  Procedures:      Objective    Physical Examination:    General-appears in no acute distress  Heart-S1-S2, regular, no murmur auscultated  Lungs-clear to auscultation bilaterally, no wheezing or crackles auscultated  Abdomen-soft, nontender, no organomegaly  Extremities-no edema in the lower extremities  Neuro-alert, oriented x3, no focal deficit noted   Status is: Inpatient  Dispo: The patient is from: Home              Anticipated d/c is to: Home              Anticipated d/c date is: 11/12/2020              Patient currently not stable for discharge  Barrier to discharge-ongoing valuation for syncope  COVID-19 Labs  Recent Labs    11/09/20 1942  DDIMER 3.46*    Lab Results  Component Value Date   [place] 11/09/2020   [place] NEGATIVE 10/17/2020   [place] NEGATIVE 05/29/2020   [place] NEGATIVE 04/07/2020    Microbiology  Recent Results (from the past 240 hour(s))  SARS CORONAVIRUS 2 (TAT 6-24 HRS) Nasopharyngeal Nasopharyngeal Swab     Status: None   Collection Time: 11/09/20  3:35 PM   Specimen: Nasopharyngeal Swab  Result Value Ref Range Status   SARS Coronavirus 2 NEGATIVE NEGATIVE Final    Comment: (NOTE) SARS-CoV-2 target nucleic acids are NOT DETECTED.  The SARS-CoV-2 RNA is generally detectable in upper and lower respiratory specimens during the acute phase of infection. Negative results do not preclude SARS-CoV-2 infection, do not rule out co-infections with other pathogens, and should not be used as the sole basis for treatment or other patient management decisions. Negative results must be combined with clinical observations, patient history, and epidemiological  information. The expected result is Negative.  Fact Sheet for Patients: SugarRoll.be  Fact Sheet for Healthcare Providers: https://www.woods-mathews.com/  This test is not yet approved or cleared by the [place] FDA and  has been authorized for detection and/or diagnosis of SARS-CoV-2 by FDA under an Emergency Use Authorization (EUA). This EUA will remain  in effect (meaning this test can be used) for the duration of the COVID-19 declaration under Se ction 564(b)(1) of the Act, 21 U.S.C. section 360bbb-3(b)(1), unless the authorization is terminated or revoked sooner.  Performed at [facility], [place] [address], [place],  [address]              [facility] If 7PM-7AM, please contact night-coverage at [email or web address], Office  [phone]   11/11/2020, 5:48 PM  LOS: 0 days

## 2020-11-12 DIAGNOSIS — R55 Syncope and collapse: Secondary | ICD-10-CM | POA: Diagnosis not present

## 2020-11-12 LAB — COMPREHENSIVE METABOLIC PANEL
ALT: 12 U/L (ref 0–44)
AST: 26 U/L (ref 15–41)
Albumin: 2.5 g/dL — ABNORMAL LOW (ref 3.5–5.0)
Alkaline Phosphatase: 79 U/L (ref 38–126)
Anion gap: 7 (ref 5–15)
BUN: 9 mg/dL (ref 8–23)
CO2: 29 mmol/L (ref 22–32)
Calcium: 8.5 mg/dL — ABNORMAL LOW (ref 8.9–10.3)
Chloride: 106 mmol/L (ref 98–111)
Creatinine, Ser: 0.69 mg/dL (ref 0.44–1.00)
GFR, Estimated: >60 mL/min (ref >60)
Glucose, Bld: 83 mg/dL (ref 70–99)
Potassium: 3.9 mmol/L (ref 3.5–5.1)
Sodium: 142 mmol/L (ref 135–145)
Total Bilirubin: 0.5 mg/dL (ref 0.3–1.2)
Total Protein: 5.4 g/dL — ABNORMAL LOW (ref 6.5–8.1)

## 2020-11-12 LAB — CBC
HCT: 27.1 % — ABNORMAL LOW (ref 36.0–46.0)
Hemoglobin: 8.6 g/dL — ABNORMAL LOW (ref 12.0–15.0)
MCH: 31 pg (ref 26.0–34.0)
MCHC: 31.7 g/dL (ref 30.0–36.0)
MCV: 97.8 fL (ref 80.0–100.0)
Platelets: 329 10*3/uL (ref 150–400)
RBC: 2.77 MIL/uL — ABNORMAL LOW (ref 3.87–5.11)
RDW: 15.2 % (ref 11.5–15.5)
WBC: 5.2 10*3/uL (ref 4.0–10.5)
nRBC: 0 % (ref 0.0–0.2)

## 2020-11-12 LAB — OCCULT BLOOD X 1 CARD TO LAB, STOOL: Fecal Occult Bld: NEGATIVE

## 2020-11-12 MED ORDER — HYDROCODONE-ACETAMINOPHEN 10-325 MG PO TABS: 1.0000 | ORAL_TABLET | Freq: Four times a day (QID) | ORAL | 0 refills | Status: DC | PRN

## 2020-11-12 NOTE — Consult Note (Signed)
[facility]  CARDIOLOGY CONSULT NOTE  Patient ID: DEMITRA DANLEY MRN: [id number] DOB/AGE: [birth year] 70 y.o.  Admit date: 11/09/2020 Referring Physician Darrick Meigs Primary Physician [facility] Primary Cardiologist Rut [place] Reason for Consultation Syncope  HPI: 70 year old female referred for evaluation of syncope.  Patient recently underwent back surgery 10/12/2020.  Reports a recent history of recurrent diarrhea, with history of Crohn's disease.  She was seen in the GI clinic 11/09/2020 when she felt lightheaded, and had a syncopal episode.  She was brought to [facility] ED ECG revealed sinus rhythm.  Telemetry is also revealed sinus rhythm.  Head CT was negative.  Chest CT did not reveal evidence for pulmonary embolus.  Opponent is normal.  2D echocardiogram 11/11/2020 revealed normal left ventricular function, with LVEF 50 to 55%.  The patient has had no recurrent presyncope or syncope.  Review of systems complete and found to be negative unless listed above     Past Medical History:  Diagnosis Date  . Anemia    iron everyday, 3 iron infusions last one August 2020  . Anxiety    nervous  . Arthritis    rheumatoid  . Collagen vascular disease ([place])   . Crohn's disease ([place])   . Crohn's disease ([place])   . DDD (degenerative disc disease), cervical   . DDD (degenerative disc disease), cervical   . DDD (degenerative disc disease), cervical 2003  . DDD (degenerative disc disease), cervical   . DDD (degenerative disc disease), lumbar   . Degenerative disc disease, cervical   . Dyspnea    out of shape  . GERD (gastroesophageal reflux disease)   . Hand weakness    left hand worse, small tremors  . Headache    with accident  . History of claustrophobia   . History of kidney stones   . HOH (hard of hearing)    left ear  . Hypertension     controlled on meds  . Lymphatic disorder   . Meniere's disease   . Motion sickness   . Nasal fracture 03/27/2020   due to fall  . Neuromuscular  disorder ([place])   . Neuropathy    legs  . Peripheral neuropathy   . 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 UTI (urinary tract infection)    HO  . Vertigo    can be accompanied by nausea  . Wears dentures    upper dentures    Past Surgical History:  Procedure Laterality Date  . ABDOMINAL HYSTERECTOMY    . APPENDECTOMY    . BACK SURGERY     x 3  . CERVICAL SPINE SURGERY     metal plate  . [place]   X 2  . CHOLECYSTECTOMY    . CLOSED REDUCTION NASAL FRACTURE N/A 04/09/2020   Procedure: CLOSED REDUCTION NASAL FRACTURE;  Surgeon: Margaretha Sheffield, MD;  Location: [place];  Service: ENT;  Laterality: N/A;  . [place]   removed 18 inches of colon,removed appendix  . COLONOSCOPY    . dental implants     3 teeth/ wears dentures  . DILATION AND CURETTAGE OF UTERUS  2004  . ELBOW FRACTURE SURGERY Left 2008  . FIBEROPTIC BRONCHOSCOPY N/A 06/01/2020   Procedure: BEDSIDE BRONCHOSCOPY FIBEROPTIC;  Surgeon: Ottie Glazier, MD;  Location: [facility];  Service: Thoracic;  Laterality: N/A;  . FRACTURE SURGERY    . JOINT REPLACEMENT Right 2015   partial knee replacement  . KYPHOPLASTY    . KYPHOPLASTY  N/A 04/14/2015   Procedure: KYPHOPLASTY;  Surgeon: Hessie Knows, MD;  Location: [facility];  Service: Orthopedics;  Laterality: N/A;  . KYPHOPLASTY N/A 06/04/2015   Procedure: KYPHOPLASTY L-5;  Surgeon: Hessie Knows, MD;  Location: [facility];  Service: Orthopedics;  Laterality: N/A;  . KYPHOPLASTY    . LUMBAR LAMINECTOMY FOR EPIDURAL ABSCESS N/A 10/12/2020   Procedure: SYNOVIAL CYST RESECTION;  Surgeon: Deetta Perla, MD;  Location: [facility];  Service: Neurosurgery;  Laterality: N/A;  . MAXIMUM ACCESS (MAS) TRANSFORAMINAL LUMBAR INTERBODY FUSION (TLIF) 1 LEVEL Left 10/12/2020   Procedure: OPEN L5/S1 TRANSFORAMINAL LUMBAR INTERBODY FUSION (TLIF) 1 LEVEL;  Surgeon: Deetta Perla, MD;  Location: [facility];  Service: Neurosurgery;  Laterality: Left;  Marland Kitchen MEDIAL PARTIAL KNEE  REPLACEMENT Right 2015  . NECK/PLATE  7616  . OOPHORECTOMY Bilateral   . ORIF NASAL FRACTURE N/A 04/09/2020   Procedure: OPEN REDUCTION INTERNAL FIXATION (ORIF) NASAL FRACTURE;  Surgeon: Margaretha Sheffield, MD;  Location: [place];  Service: ENT;  Laterality: N/A;  . REPAIR EXTENSOR TENDON Left 01/08/2019   Procedure: Realignment  EXTENSOR TENDON;  Surgeon: Hessie Knows, MD;  Location: [facility];  Service: Orthopedics;  Laterality: Left;  . REVERSE SHOULDER ARTHROPLASTY Right 04/18/2019   Procedure: REVERSE SHOULDER ARTHROPLASTY;  Surgeon: Corky Mull, MD;  Location: [facility];  Service: Orthopedics;  Laterality: Right;  . SHOULDER ARTHROSCOPY WITH ROTATOR CUFF REPAIR AND SUBACROMIAL DECOMPRESSION Right 04/13/2016   Procedure: SHOULDER ARTHROSCOPY WITH ROTATOR CUFF REPAIR AND SUBACROMIAL DECOMPRESSION, release of long head biceps tendon;  Surgeon: Leanor Kail, MD;  Location: [facility];  Service: Orthopedics;  Laterality: Right;  . TOE SURGERY Right 2013  . TONSILLECTOMY  1995  . UPPER GI ENDOSCOPY    . WRIST FRACTURE SURGERY Bilateral     Medications Prior to Admission  Medication Sig Dispense Refill Last Dose  . acetaminophen (TYLENOL) 650 MG CR tablet Take 1,300 mg by mouth every 8 (eight) hours as needed for pain.   Unknown at PRN  . alendronate (FOSAMAX) 70 MG tablet Take 70 mg by mouth every Monday. Take with a full glass of water on an empty stomach.     Marland Kitchen aspirin EC 81 MG tablet Take 81 mg by mouth daily. Swallow whole.     . betamethasone dipropionate 0.05 % lotion Apply topically 2 (two) times daily as needed (Rash). Apply thin coat to affected areas twice daily for 2 weeks 60 mL 0 Unknown at PRN  . cetirizine (ZYRTEC) 10 MG tablet Take 10 mg by mouth daily.     . chlorhexidine (PERIDEX) 0.12 % solution Use as directed 15 mLs in the mouth or throat as needed (mouth sores).   Unknown at PRN  . cholecalciferol (VITAMIN D3) 25 MCG (1000 UT) tablet Take 1,000 Units by mouth daily.      . cyanocobalamin (,VITAMIN B-12,) 1000 MCG/ML injection Inject 1,000 mcg into the muscle every 30 (thirty) days.     . diazepam (VALIUM) 10 MG tablet Take 1 tablet (10 mg total) by mouth daily as needed (vaginal pain with intercourse, use before sexual activity per vagina). (Patient taking differently: Take 10 mg by mouth as needed (vaginal pain with intercourse, use before sexual activity per vagina).) 30 tablet 0 Unknown at PRN  . diphenoxylate-atropine (LOMOTIL) 2.5-0.025 MG per tablet Take 1 tablet by mouth daily as needed (Crohn's symptoms).   Unknown at PRN  . fluconazole (DIFLUCAN) 200 MG tablet Take 400 mg by mouth daily.   11/08/2020 at 0800  .  furosemide (LASIX) 20 MG tablet Take 20 mg by mouth daily.   Past Week at Unknown  . gabapentin (NEURONTIN) 100 MG capsule Take 100 mg by mouth every morning.   11/08/2020 at 0800  . gabapentin (NEURONTIN) 400 MG capsule Take 400 mg by mouth at bedtime.   11/08/2020 at 2100  . lidocaine (XYLOCAINE) 2 % solution Use as directed 15 mLs in the mouth or throat as needed for mouth pain.   Unknown at PRN  . magnesium oxide (MAG-OX) 400 MG tablet Take 400 mg by mouth every Monday, Wednesday, and Friday.     . Multiple Vitamin (MULTIVITAMIN WITH MINERALS) TABS tablet Take 1 tablet by mouth daily.     Marland Kitchen omeprazole (PRILOSEC) 40 MG capsule Take 40 mg by mouth daily before breakfast.    11/08/2020 at 1800  . potassium chloride SA (K-DUR,KLOR-CON) 20 MEQ tablet Take 20 mEq by mouth every Monday, Wednesday, and Friday.     . predniSONE (DELTASONE) 1 MG tablet Take 3 mg by mouth daily with breakfast.      . Probiotic Product (PROBIOTIC PO) Take 1 capsule by mouth daily.      Marland Kitchen rOPINIRole (REQUIP) 1 MG tablet Take 1 mg by mouth at bedtime as needed (for restless legs).    Unknown at PRN  . sucralfate (CARAFATE) 1 g tablet Take 0.5 g by mouth 3 (three) times daily with meals as needed (stomach cramping/Crohn's symptoms).   Unknown at PRN  . Tofacitinib Citrate  (XELJANZ) 5 MG TABS Take 5 mg by mouth 2 (two) times daily.   11/08/2020 at 2100  . traMADol (ULTRAM) 50 MG tablet Take 50 mg by mouth 2 (two) times daily as needed for pain.   Unknown at PRN  . HYDROcodone-acetaminophen (NORCO) 10-325 MG tablet Take 1 tablet by mouth every 6 (six) hours as needed for severe pain. (Patient not taking: No sig reported) 30 tablet 0 Not Taking at Unknown time  . nitrofurantoin, macrocrystal-monohydrate, (MACROBID) 100 MG capsule Take 1 capsule (100 mg total) by mouth every 12 (twelve) hours. (Patient not taking: No sig reported) 14 capsule 0 Not Taking at Unknown time  . telmisartan (MICARDIS) 20 MG tablet Take 20 mg by mouth every Monday, Wednesday, and Friday.      Social History   Socioeconomic History  . Marital status: Married    Spouse name: Not on file  . Number of children: Not on file  . Years of education: Not on file  . Highest education level: Not on file  Occupational History  . Not on file  Tobacco Use  . Smoking status: Never Smoker  . Smokeless tobacco: Never Used  Vaping Use  . Vaping Use: Never used  Substance and Sexual Activity  . Alcohol use: No  . Drug use: No  . Sexual activity: Yes    Birth control/protection: Surgical  Other Topics Concern  . Not on file  Social History Narrative  . Not on file   Social Determinants of Health   Financial Resource Strain: Not on file  Food Insecurity: Not on file  Transportation Needs: Not on file  Physical Activity: Not on file  Stress: Not on file  Social Connections: Not on file  Intimate Partner Violence: Not on file    Family History  Problem Relation Age of Onset  . Diabetes Mother   . Emphysema Mother   . Emphysema Father   . Colon cancer Maternal Grandfather   . Breast cancer Neg Hx   .  Ovarian cancer Neg Hx       Review of systems complete and found to be negative unless listed above      PHYSICAL EXAM  General: Well developed, well nourished, in no acute  distress HEENT:  Normocephalic and atramatic Neck:  No JVD.  Lungs: Clear bilaterally to auscultation and percussion. Heart: HRRR . Normal S1 and S2 without gallops or murmurs.  Abdomen: Bowel sounds are positive, abdomen soft and non-tender  Msk:  Back normal, normal gait. Normal strength and tone for age. Extremities: No clubbing, cyanosis or edema.   Neuro: Alert and oriented X 3. Psych:  Good affect, responds appropriately  Labs:   Lab Results  Component Value Date   WBC 5.2 11/12/2020   HGB 8.6 (L) 11/12/2020   HCT 27.1 (L) 11/12/2020   MCV 97.8 11/12/2020   PLT 329 11/12/2020    Recent Labs  Lab 11/12/20 0428  NA 142  K 3.9  CL 106  CO2 29  BUN 9  CREATININE 0.69  CALCIUM 8.5*  PROT 5.4*  BILITOT 0.5  ALKPHOS 79  ALT 12  AST 26  GLUCOSE 83   Lab Results  Component Value Date   TROPONINI <0.03 08/23/2018   No results found for: CHOL No results found for: HDL No results found for: LDLCALC No results found for: TRIG No results found for: CHOLHDL No results found for: LDLDIRECT    Radiology: DG Chest 2 View  Result Date: 11/09/2020 CLINICAL DATA:  Emesis and syncope. EXAM: CHEST - 2 VIEW COMPARISON:  10/17/2020 FINDINGS: Stable cardiomediastinal contours. No pleural effusion or interstitial edema. Similar appearance known right upper lobe lung nodules. These measure up to 1.5 cm. Previously characterized as sequelae of pulmonary fungal infection. No acute superimposed airspace consolidation. Previous right shoulder arthroplasty and ACDF within the lower cervical spine. Treated compression fractures noted within the midthoracic and upper lumbar spine. IMPRESSION: No acute cardiopulmonary abnormalities. Electronically Signed   By: Kerby Moors M.D.   On: 11/09/2020 11:44   CT HEAD WO CONTRAST  Result Date: 11/09/2020 CLINICAL DATA:  Recent syncopal episode EXAM: CT HEAD WITHOUT CONTRAST TECHNIQUE: Contiguous axial images were obtained from the base of the skull  through the vertex without intravenous contrast. COMPARISON:  03/27/2020 FINDINGS: Brain: No evidence of acute infarction, hemorrhage, hydrocephalus, extra-axial collection or mass lesion/mass effect. Chronic atrophic and ischemic changes are noted. Cavum septum pellucidum is again seen and stable. Vascular: No hyperdense vessel or unexpected calcification. Skull: Normal. Negative for fracture or focal lesion. Sinuses/Orbits: No acute finding. Other: None. IMPRESSION: Chronic atrophic and ischemic changes without acute abnormality. Electronically Signed   By: Inez Catalina M.D.   On: 11/09/2020 19:26   CT ANGIO CHEST PE W OR WO CONTRAST  Result Date: 11/09/2020 CLINICAL DATA:  Recent syncopal episode with positive D-dimer EXAM: CT ANGIOGRAPHY CHEST WITH CONTRAST TECHNIQUE: Multidetector CT imaging of the chest was performed using the standard protocol during bolus administration of intravenous contrast. Multiplanar CT image reconstructions and MIPs were obtained to evaluate the vascular anatomy. CONTRAST:  24m OMNIPAQUE IOHEXOL 350 MG/ML SOLN COMPARISON:  Chest x-ray from earlier in the same day. FINDINGS: Cardiovascular: Thoracic aorta demonstrates a normal branching pattern. No aneurysmal dilatation or dissection is seen. No cardiac enlargement is noted. Mild coronary calcifications are seen. The pulmonary artery is well visualized within normal branching pattern. No intraluminal filling defect is noted. Mediastinum/Nodes: Thoracic inlet is within normal limits. No hilar or mediastinal adenopathy is noted. The esophagus as  visualized is within normal limits. Lungs/Pleura: Scattered calcified granulomas are noted in the left lung. The lungs otherwise well aerated. Right lung again demonstrates multiple upper lobe pulmonary nodules. These have decreased slightly in the interval from the prior exam. No new focal infiltrate is seen. Upper Abdomen: Changes of prior cholecystectomy. No acute abnormality is noted.  Musculoskeletal: Postsurgical changes are noted in the cervical spine and right shoulder. Degenerative changes of the thoracic spine are seen. Changes of prior vertebral augmentation are noted. Healed sternal fracture is noted. Old healed rib fractures are noted on the right. Review of the MIP images confirms the above findings. IMPRESSION: No evidence of pulmonary emboli.  No thoracic aneurysm is seen. Changes consistent with prior granulomatous disease. Pulmonary nodular changes in the right upper lobe consistent with the given clinical history of prior fungal infection. They have decreased somewhat in the interval from the prior exam. No new focal infiltrate is seen. Electronically Signed   By: Inez Catalina M.D.   On: 11/09/2020 23:57   US Venous Img Lower Bilateral (DVT)  Result Date: 11/10/2020 CLINICAL DATA:  70 year old female history of positive D-dimer EXAM: BILATERAL LOWER EXTREMITY VENOUS DOPPLER ULTRASOUND TECHNIQUE: Gray-scale sonography with graded compression, as well as color Doppler and duplex ultrasound were performed to evaluate the lower extremity deep venous systems from the level of the common femoral vein and including the common femoral, femoral, profunda femoral, popliteal and calf veins including the posterior tibial, peroneal and gastrocnemius veins when visible. The superficial great saphenous vein was also interrogated. Spectral Doppler was utilized to evaluate flow at rest and with distal augmentation maneuvers in the common femoral, femoral and popliteal veins. COMPARISON:  None. FINDINGS: RIGHT LOWER EXTREMITY Common Femoral Vein: No evidence of thrombus. Normal compressibility, respiratory phasicity and response to augmentation. Saphenofemoral Junction: No evidence of thrombus. Normal compressibility and flow on color Doppler imaging. Profunda Femoral Vein: No evidence of thrombus. Normal compressibility and flow on color Doppler imaging. Femoral Vein: No evidence of thrombus.  Normal compressibility, respiratory phasicity and response to augmentation. Popliteal Vein: No evidence of thrombus. Normal compressibility, respiratory phasicity and response to augmentation. Calf Veins: No evidence of thrombus. Normal compressibility and flow on color Doppler imaging. Superficial Great Saphenous Vein: No evidence of thrombus. Normal compressibility and flow on color Doppler imaging. Other Findings:  None. LEFT LOWER EXTREMITY Common Femoral Vein: No evidence of thrombus. Normal compressibility, respiratory phasicity and response to augmentation. Saphenofemoral Junction: No evidence of thrombus. Normal compressibility and flow on color Doppler imaging. Profunda Femoral Vein: No evidence of thrombus. Normal compressibility and flow on color Doppler imaging. Femoral Vein: No evidence of thrombus. Normal compressibility, respiratory phasicity and response to augmentation. Popliteal Vein: No evidence of thrombus. Normal compressibility, respiratory phasicity and response to augmentation. Calf Veins: No evidence of thrombus. Normal compressibility and flow on color Doppler imaging. Superficial Great Saphenous Vein: No evidence of thrombus. Normal compressibility and flow on color Doppler imaging. Other Findings:  None. IMPRESSION: Sonographic survey of the bilateral lower extremities negative for DVT Electronically Signed   By: Corrie Mckusick D.O.   On: 11/10/2020 10:25   DG Chest Port 1 View  Result Date: 10/17/2020 CLINICAL DATA:  Pt in with AMS per husband, had spinal cyst surgery on 2/28, discharged on 3/3. tachypneic in triage, sats 92-97% appears to be grimacing in pain. Husband says she woke up in excruciating pain EXAM: PORTABLE CHEST - 1 VIEW COMPARISON:  03/27/2020 FINDINGS: Lungs are clear. Heart size and mediastinal  contours are within normal limits. No effusion.  No pneumothorax. Stable changes of cement augmentation, midthoracic spine. Stable cervical fixation hardware. Previous right  shoulder arthroplasty. IMPRESSION: No acute cardiopulmonary disease and a. Electronically Signed   By: Lucrezia Europe M.D.   On: 10/17/2020 09:48   ECHOCARDIOGRAM COMPLETE  Result Date: 11/11/2020    ECHOCARDIOGRAM REPORT   Patient Name:   KARESS HARNER Date of Exam: 11/11/2020 Medical Rec #:  [id number]         Height:       62.0 in Accession #:    [id number]        Weight:       175.0 lb Date of Birth:  Nov 21, 1950         BSA:          1.806 m Patient Age:    82 years          BP:           140/72 mmHg Patient Gender: F                 HR:           80 bpm. Exam Location:  [facility] Procedure: 2D Echo, Color Doppler and Cardiac Doppler Indications:     R55 Syncope  History:         Patient has prior history of Echocardiogram examinations. Risk                  Factors:Hypertension.  Sonographer:     Charmayne Sheer RDCS (AE) Referring Phys:  [id number] Soledad Gerlach NIU Diagnosing Phys: Serafina Royals MD IMPRESSIONS  1. Left ventricular ejection fraction, by estimation, is 50 to 55%. The left ventricle has low normal function. The left ventricle has no regional wall motion abnormalities. Left ventricular diastolic parameters were normal.  2. Right ventricular systolic function is normal. The right ventricular size is normal.  3. The mitral valve is normal in structure. Trivial mitral valve regurgitation.  4. The aortic valve is normal in structure. Aortic valve regurgitation is not visualized. FINDINGS  Left Ventricle: Left ventricular ejection fraction, by estimation, is 50 to 55%. The left ventricle has low normal function. The left ventricle has no regional wall motion abnormalities. The left ventricular internal cavity size was normal in size. There is no left ventricular hypertrophy. Left ventricular diastolic parameters were normal. Right Ventricle: The right ventricular size is normal. No increase in right ventricular wall thickness. Right ventricular systolic function is normal. Left Atrium: Left atrial size was normal in size.  Right Atrium: Right atrial size was normal in size. Pericardium: There is no evidence of pericardial effusion. Mitral Valve: The mitral valve is normal in structure. Trivial mitral valve regurgitation. MV peak gradient, 4.2 mmHg. The mean mitral valve gradient is 2.0 mmHg. Tricuspid Valve: The tricuspid valve is normal in structure. Tricuspid valve regurgitation is trivial. Aortic Valve: The aortic valve is normal in structure. Aortic valve regurgitation is not visualized. Aortic valve mean gradient measures 2.0 mmHg. Aortic valve peak gradient measures 5.1 mmHg. Aortic valve area, by VTI measures 3.78 cm. Pulmonic Valve: The pulmonic valve was normal in structure. Pulmonic valve regurgitation is trivial. Aorta: The aortic root and ascending aorta are structurally normal, with no evidence of dilitation. IAS/Shunts: No atrial level shunt detected by color flow Doppler.  LEFT VENTRICLE PLAX 2D LVIDd:         4.30 cm  Diastology LVIDs:         3.00  cm  LV e' medial:    6.64 cm/s LV PW:         1.10 cm  LV E/e' medial:  10.5 LV IVS:        1.10 cm  LV e' lateral:   5.98 cm/s LVOT diam:     2.40 cm  LV E/e' lateral: 11.7 LV SV:         78 LV SV Index:   43 LVOT Area:     4.52 cm  RIGHT VENTRICLE RV Basal diam:  3.60 cm LEFT ATRIUM             Index LA diam:        4.00 cm 2.21 cm/m LA Vol (A2C):   48.5 ml 26.85 ml/m LA Vol (A4C):   55.4 ml 30.67 ml/m LA Biplane Vol: 52.1 ml 28.84 ml/m  AORTIC VALVE                   PULMONIC VALVE AV Area (Vmax):    3.32 cm    PV Vmax:       0.92 m/s AV Area (Vmean):   3.66 cm    PV Vmean:      65.100 cm/s AV Area (VTI):     3.78 cm    PV VTI:        0.152 m AV Vmax:           113.00 cm/s PV Peak grad:  3.4 mmHg AV Vmean:          73.900 cm/s PV Mean grad:  2.0 mmHg AV VTI:            0.207 m AV Peak Grad:      5.1 mmHg AV Mean Grad:      2.0 mmHg LVOT Vmax:         82.90 cm/s LVOT Vmean:        59.800 cm/s LVOT VTI:          0.173 m LVOT/AV VTI ratio: 0.84  AORTA Ao Root diam:  3.10 cm MITRAL VALVE MV Area (PHT): 5.97 cm    SHUNTS MV Area VTI:   4.09 cm    Systemic VTI:  0.17 m MV Peak grad:  4.2 mmHg    Systemic Diam: 2.40 cm MV Mean grad:  2.0 mmHg MV Vmax:       1.03 m/s MV Vmean:      65.3 cm/s MV Decel Time: 127 msec MV E velocity: 69.80 cm/s MV A velocity: 84.80 cm/s MV E/A ratio:  0.82 Serafina Royals MD Electronically signed by Serafina Royals MD Signature Date/Time: 11/11/2020/5:18:18 PM    Final     EKG: Sinus rhythm at 86 bpm  ASSESSMENT AND PLAN:   1.  Syncope, in the absence of arrhythmias, with normal troponin, normal 2D echocardiogram, negative head CT, negative chest CT, without recurrence, in the setting of recurrent diarrhea with history of Crohn's disease, likely vasovagal in nature 2.  Essential hypertension, blood pressure in normal range, not currently on BP medications  Recommendations  1.  Agree with current therapy 2.  Defer further cardiac diagnostics at this time 3.  30-day Holter monitor to be placed 4.  May discharge home later today 5.  Follow-up in 1-2 weeks  Signed: Isaias Cowman MD,PhD, [facility] 11/12/2020, 9:21 AM

## 2020-11-12 NOTE — Plan of Care (Signed)
  Problem: Education: Goal: Knowledge of General Education information will improve Description: Including pain rating scale, medication(s)/side effects and non-pharmacologic comfort measures Outcome: Progressing   Problem: Health Behavior/Discharge Planning: Goal: Ability to manage health-related needs will improve Outcome: Progressing   Problem: Clinical Measurements: Goal: Ability to maintain clinical measurements within normal limits will improve Outcome: Progressing Goal: Will remain free from infection Outcome: Progressing Goal: Diagnostic test results will improve Outcome: Progressing   Problem: Activity: Goal: Risk for activity intolerance will decrease Outcome: Progressing   Problem: Pain Managment: Goal: General experience of comfort will improve Outcome: Progressing   Problem: Safety: Goal: Ability to remain free from injury will improve Outcome: Progressing   Problem: Skin Integrity: Goal: Risk for impaired skin integrity will decrease Outcome: Progressing

## 2020-11-12 NOTE — Discharge Summary (Addendum)
Physician Discharge Summary  Nicole MARCIEL EEF:[id number] DOB: Aug 19, 1950 DOA: 11/09/2020  PCP: [name], MD  Admit date: 11/09/2020 Discharge date: 11/12/2020  Time spent: 50* minutes  Recommendations for Outpatient Follow-up:  1. Follow-up cardiology in 2 weeks 2. 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 Patient underwent back surgery on L5-S1 TL IVF 10/12/2020.  Patient went to GI clinic for follow-up visit and felt dizzy and lightheaded.  Per patient's husband she passed out twice.  There was no seizure-like activity.  No focal neurological deficit. She was found to have potassium of 2.8, magnesium 1.6, WBC 10.5.  D-dimer was elevated CTA was negative for PE.  CT chest showed prior granulomatous disease, pulmonary nodular changes in the right upper lobe consistent with prior fungal infection.  No new focal infiltrate seen  Hospital Course:  1. *Syncope-unclear etiology, D-dimer was positive, CTA chest was negative for PE.  2D echo is unremarkable.  CT head showed no acute abnormality.  Patient has been taking prednisone 3 mg daily for past 10 years, random serum cortisol level obtained this morning is 13.2.     Cardiology was consulted, Holter monitor has been set up.  Patient will follow up with cardiology in 2 weeks as outpatient.  2. Hypomagnesemia-patient's magnesium was 1.6, magnesium was replaced.  Repeat magnesium is 1.9.  3. Hypokalemia-potassium was 2.5 on presentation, potassium replaced.  Repeat potassium this morning is 4.3.  4. Hypertension-blood pressure is stable  5. Crohn's colitis/rheumatoid arthritis-continue prednisone,Tofacitinib.  6. 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  Patient to follow-up with ID as outpatient.  7. Anemia-hemoglobin is 8.4, dropped from 10.0, 2 days ago.  Likely dilutional.  Serum iron 127, transferrin 190.2, ferritin 1500, saturation 48%.    FOBT was negative.  Follow-up gastroenterology as outpatient.  8. Recent back surgery-follow-up with neurosurgery as outpatient.  Continue PT as outpatient.   Procedures:    Consultations:    Discharge Exam: Vitals:   11/12/20 0740 11/12/20 1116  BP: 117/79 (!) 146/71  Pulse: 91 87  Resp: 17 18  Temp: 98.5 F (36.9 C) 98.5 F (36.9 C)  SpO2: 98% 99%    General: Appears in no acute distress Cardiovascular: S1-S2, regular, no murmur auscultated Respiratory: Clear to auscultation bilaterally  Discharge Instructions   Discharge Instructions    Diet - low sodium heart healthy   Complete by: As directed    Increase activity slowly   Complete by: As directed    No wound care   Complete by: As directed      Allergies as of 11/12/2020      Reactions   Ciprofloxacin Itching, Rash   Levofloxacin Hives, Rash   Methotrexate    Other reaction(s): Other (See Comments) Mouth Ulcers/[place] Mouth and throat ulcers   Methotrexate  Derivatives    Mouth and throat ulcers   Other Other (See Comments), Rash   Other reaction(s): Other (See Comments), Unknown Per pts MD she is unable to take this medication because it is contraindicated with her other medications. Per pts MD  she is unable to take this medication because it is contraindicated with her other medications.  Other reaction(s): Other (See Comments), Unknown Other reaction(s): Other (See Comments), Unknown Per pts MD she is unable to take this medication because it is contraindicated with her other medications. Per pts MD she is unable to take this medication because it is contraindicated with her other medications. Other reaction(s): Other (See Comments), Unknown Other reaction(s): Other (See Comments), Unknown Per pts MD she is unable to take this medication because it is contraindicated with her  Per pts MD she is unable to take this medication because it is contraindicated with her other medications. Other reaction(s): Other (See Comments) Other reaction(s): Other (See Comments), Unknown Per pts MD she is unable to take this medication because it is contraindicated with her other medications. Per pts MD she is unable to take this medication because it is contraindicated with her other medications.  Other reaction(s): Other (See Comments), Unknown Other reaction(s): Other (See Comments), Unknown Per pts MD she is unable to take this medication because it is contraindicated with her other medications. Per pts MD she is unable to take this medication because it is contraindicated with her other medications. Other reaction(s): Other (See Comments), Unknown Other reaction(s): Other (See Comments), Unknown Per pts MD she is unable to take this medication because it is contraindicated with her  Per pts MD she is unable to take this medication because it is contraindicated with her other medications.   Sulfa Antibiotics Other (See Comments)   Per pts MD she is unable to take this medication because it is contraindicated with her other medications.   Trelegy Ellipta [fluticasone-umeclidin-vilant]    Laryngitis    Cefazolin Itching, Rash   Cefprozil Rash   Cephalosporins Itching, Rash    Enbrel [etanercept] Itching, Rash   Humira [adalimumab] Itching, Rash   Hydrocodone-acetaminophen Itching, Other (See Comments)   Itching only   Lorabid [loracarbef] Itching, Rash   Meropenem Itching, Rash   Oxycodone Itching, Other (See Comments), Rash   Other reaction(s): Other (See Comments)   Oxycodone-acetaminophen Itching   Primidone Itching, Rash      Medication List    STOP taking these medications   acetaminophen 650 MG CR tablet Commonly known as: TYLENOL   furosemide 20 MG tablet Commonly known as: LASIX   nitrofurantoin (macrocrystal-monohydrate) 100 MG capsule Commonly known as: MACROBID   potassium chloride SA 20 MEQ tablet Commonly known as: KLOR-CON   telmisartan 20 MG tablet Commonly known as: MICARDIS   traMADol 50 MG tablet Commonly known as: ULTRAM     TAKE these medications   alendronate 70 MG tablet Commonly known as: FOSAMAX Take 70 mg by mouth every Monday. Take with a full glass of water on an empty stomach.   aspirin EC 81 MG tablet Take 81 mg by mouth daily. Swallow whole.   betamethasone dipropionate 0.05 % lotion Apply topically 2 (two) times daily as needed (Rash). Apply thin coat to affected areas twice daily for 2 weeks   cetirizine 10 MG tablet Commonly known as: ZYRTEC Take 10 mg by mouth daily.   chlorhexidine 0.12 % solution Commonly known as: PERIDEX Use as directed 15 mLs in the mouth or throat  as needed (mouth sores).   cholecalciferol 25 MCG (1000 UNIT) tablet Commonly known as: VITAMIN D3 Take 1,000 Units by mouth daily.   cyanocobalamin 1000 MCG/ML injection Commonly known as: (VITAMIN B-12) Inject 1,000 mcg into the muscle every 30 (thirty) days.   diazepam 10 MG tablet Commonly known as: Valium Take 1 tablet (10 mg total) by mouth daily as needed (vaginal pain with intercourse, use before sexual activity per vagina). What changed: when to take this   diphenoxylate-atropine 2.5-0.025 MG tablet Commonly known  as: LOMOTIL Take 1 tablet by mouth daily as needed (Crohn's symptoms).   fluconazole 200 MG tablet Commonly known as: DIFLUCAN Take 400 mg by mouth daily.   gabapentin 400 MG capsule Commonly known as: NEURONTIN Take 400 mg by mouth at bedtime.   gabapentin 100 MG capsule Commonly known as: NEURONTIN Take 100 mg by mouth every morning.   HYDROcodone-acetaminophen 10-325 MG tablet Commonly known as: NORCO Take 1 tablet by mouth every 6 (six) hours as needed for severe pain.   lidocaine 2 % solution Commonly known as: XYLOCAINE Use as directed 15 mLs in the mouth or throat as needed for mouth pain.   magnesium oxide 400 MG tablet Commonly known as: MAG-OX Take 400 mg by mouth every Monday, Wednesday, and Friday.   multivitamin with minerals Tabs tablet Take 1 tablet by mouth daily.   omeprazole 40 MG capsule Commonly known as: PRILOSEC Take 40 mg by mouth daily before breakfast.   predniSONE 1 MG tablet Commonly known as: DELTASONE Take 3 mg by mouth daily with breakfast.   PROBIOTIC PO Take 1 capsule by mouth daily.   rOPINIRole 1 MG tablet Commonly known as: REQUIP Take 1 mg by mouth at bedtime as needed (for restless legs).   sucralfate 1 g tablet Commonly known as: CARAFATE Take 0.5 g by mouth 3 (three) times daily with meals as needed (stomach cramping/Crohn's symptoms).   Xeljanz 5 MG Tabs Generic drug: Tofacitinib Citrate Take 5 mg by mouth 2 (two) times daily.      Allergies  Allergen Reactions  . Ciprofloxacin Itching and Rash  . Levofloxacin Hives and Rash  . Methotrexate     Other reaction(s): Other (See Comments) Mouth Ulcers/[place] Mouth and throat ulcers  . Methotrexate Derivatives     Mouth and throat ulcers  . Other Other (See Comments) and Rash    Other reaction(s): Other (See Comments), Unknown Per pts MD she is unable to take this medication because it is contraindicated with her other medications. Per pts MD she is unable to take  this medication because it is contraindicated with her other medications.  Other reaction(s): Other (See Comments), Unknown Other reaction(s): Other (See Comments), Unknown Per pts MD she is unable to take this medication because it is contraindicated with her other medications. Per pts MD she is unable to take this medication because it is contraindicated with her other medications. Other reaction(s): Other (See Comments), Unknown Other reaction(s): Other (See Comments), Unknown Per pts MD she is unable to take this medication because it is contraindicated with her  Per pts MD she is unable to take this medication because it is contraindicated with her other medications. Other reaction(s): Other (See Comments) Other reaction(s): Other (See Comments), Unknown Per pts MD she is unable to take this medication because it is contraindicated with her other medications. Per pts MD she is unable to take this medication because it is contraindicated with her other medications.  Other reaction(s): Other (  See Comments), Unknown Other reaction(s): Other (See Comments), Unknown Per pts MD she is unable to take this medication because it is contraindicated with her other medications. Per pts MD she is unable to take this medication because it is contraindicated with her other medications. Other reaction(s): Other (See Comments), Unknown Other reaction(s): Other (See Comments), Unknown Per pts MD she is unable to take this medication because it is contraindicated with her  Per pts MD she is unable to take this medication because it is contraindicated with her other medications.  . Sulfa Antibiotics Other (See Comments)    Per pts MD she is unable to take this medication because it is contraindicated with her other medications.  . Trelegy Ellipta [Fluticasone-Umeclidin-Vilant]     Laryngitis   . Cefazolin Itching and Rash  . Cefprozil Rash  . Cephalosporins Itching and Rash  . Enbrel  [Etanercept] Itching and Rash  . Humira [Adalimumab] Itching and Rash  . Hydrocodone-Acetaminophen Itching and Other (See Comments)    Itching only  . Lorabid [Loracarbef] Itching and Rash  . Meropenem Itching and Rash  . Oxycodone Itching, Other (See Comments) and Rash    Other reaction(s): Other (See Comments)  . Oxycodone-Acetaminophen Itching  . Primidone Itching and Rash      The results of significant diagnostics from this hospitalization (including imaging, microbiology, ancillary and laboratory) are listed below for reference.    Significant Diagnostic Studies: DG Chest 2 View  Result Date: 11/09/2020 CLINICAL DATA:  Emesis and syncope. EXAM: CHEST - 2 VIEW COMPARISON:  10/17/2020 FINDINGS: Stable cardiomediastinal contours. No pleural effusion or interstitial edema. Similar appearance known right upper lobe lung nodules. These measure up to 1.5 cm. Previously characterized as sequelae of pulmonary fungal infection. No acute superimposed airspace consolidation. Previous right shoulder arthroplasty and ACDF within the lower cervical spine. Treated compression fractures noted within the midthoracic and upper lumbar spine. IMPRESSION: No acute cardiopulmonary abnormalities. Electronically Signed   By: Kerby Moors M.D.   On: 11/09/2020 11:44   CT HEAD WO CONTRAST  Result Date: 11/09/2020 CLINICAL DATA:  Recent syncopal episode EXAM: CT HEAD WITHOUT CONTRAST TECHNIQUE: Contiguous axial images were obtained from the base of the skull through the vertex without intravenous contrast. COMPARISON:  03/27/2020 FINDINGS: Brain: No evidence of acute infarction, hemorrhage, hydrocephalus, extra-axial collection or mass lesion/mass effect. Chronic atrophic and ischemic changes are noted. Cavum septum pellucidum is again seen and stable. Vascular: No hyperdense vessel or unexpected calcification. Skull: Normal. Negative for fracture or focal lesion. Sinuses/Orbits: No acute finding. Other: None.  IMPRESSION: Chronic atrophic and ischemic changes without acute abnormality. Electronically Signed   By: Inez Catalina M.D.   On: 11/09/2020 19:26   CT ANGIO CHEST PE W OR WO CONTRAST  Result Date: 11/09/2020 CLINICAL DATA:  Recent syncopal episode with positive D-dimer EXAM: CT ANGIOGRAPHY CHEST WITH CONTRAST TECHNIQUE: Multidetector CT imaging of the chest was performed using the standard protocol during bolus administration of intravenous contrast. Multiplanar CT image reconstructions and MIPs were obtained to evaluate the vascular anatomy. CONTRAST:  46m OMNIPAQUE IOHEXOL 350 MG/ML SOLN COMPARISON:  Chest x-ray from earlier in the same day. FINDINGS: Cardiovascular: Thoracic aorta demonstrates a normal branching pattern. No aneurysmal dilatation or dissection is seen. No cardiac enlargement is noted. Mild coronary calcifications are seen. The pulmonary artery is well visualized within normal branching pattern. No intraluminal filling defect is noted. Mediastinum/Nodes: Thoracic inlet is within normal limits. No hilar or mediastinal adenopathy is noted. The esophagus as  visualized is within normal limits. Lungs/Pleura: Scattered calcified granulomas are noted in the left lung. The lungs otherwise well aerated. Right lung again demonstrates multiple upper lobe pulmonary nodules. These have decreased slightly in the interval from the prior exam. No new focal infiltrate is seen. Upper Abdomen: Changes of prior cholecystectomy. No acute abnormality is noted. Musculoskeletal: Postsurgical changes are noted in the cervical spine and right shoulder. Degenerative changes of the thoracic spine are seen. Changes of prior vertebral augmentation are noted. Healed sternal fracture is noted. Old healed rib fractures are noted on the right. Review of the MIP images confirms the above findings. IMPRESSION: No evidence of pulmonary emboli.  No thoracic aneurysm is seen. Changes consistent with prior granulomatous disease.  Pulmonary nodular changes in the right upper lobe consistent with the given clinical history of prior fungal infection. They have decreased somewhat in the interval from the prior exam. No new focal infiltrate is seen. Electronically Signed   By: Inez Catalina M.D.   On: 11/09/2020 23:57   US Venous Img Lower Bilateral (DVT)  Result Date: 11/10/2020 CLINICAL DATA:  70 year old female history of positive D-dimer EXAM: BILATERAL LOWER EXTREMITY VENOUS DOPPLER ULTRASOUND TECHNIQUE: Gray-scale sonography with graded compression, as well as color Doppler and duplex ultrasound were performed to evaluate the lower extremity deep venous systems from the level of the common femoral vein and including the common femoral, femoral, profunda femoral, popliteal and calf veins including the posterior tibial, peroneal and gastrocnemius veins when visible. The superficial great saphenous vein was also interrogated. Spectral Doppler was utilized to evaluate flow at rest and with distal augmentation maneuvers in the common femoral, femoral and popliteal veins. COMPARISON:  None. FINDINGS: RIGHT LOWER EXTREMITY Common Femoral Vein: No evidence of thrombus. Normal compressibility, respiratory phasicity and response to augmentation. Saphenofemoral Junction: No evidence of thrombus. Normal compressibility and flow on color Doppler imaging. Profunda Femoral Vein: No evidence of thrombus. Normal compressibility and flow on color Doppler imaging. Femoral Vein: No evidence of thrombus. Normal compressibility, respiratory phasicity and response to augmentation. Popliteal Vein: No evidence of thrombus. Normal compressibility, respiratory phasicity and response to augmentation. Calf Veins: No evidence of thrombus. Normal compressibility and flow on color Doppler imaging. Superficial Great Saphenous Vein: No evidence of thrombus. Normal compressibility and flow on color Doppler imaging. Other Findings:  None. LEFT LOWER EXTREMITY Common  Femoral Vein: No evidence of thrombus. Normal compressibility, respiratory phasicity and response to augmentation. Saphenofemoral Junction: No evidence of thrombus. Normal compressibility and flow on color Doppler imaging. Profunda Femoral Vein: No evidence of thrombus. Normal compressibility and flow on color Doppler imaging. Femoral Vein: No evidence of thrombus. Normal compressibility, respiratory phasicity and response to augmentation. Popliteal Vein: No evidence of thrombus. Normal compressibility, respiratory phasicity and response to augmentation. Calf Veins: No evidence of thrombus. Normal compressibility and flow on color Doppler imaging. Superficial Great Saphenous Vein: No evidence of thrombus. Normal compressibility and flow on color Doppler imaging. Other Findings:  None. IMPRESSION: Sonographic survey of the bilateral lower extremities negative for DVT Electronically Signed   By: Corrie Mckusick D.O.   On: 11/10/2020 10:25   DG Chest Port 1 View  Result Date: 10/17/2020 CLINICAL DATA:  Pt in with AMS per husband, had spinal cyst surgery on 2/28, discharged on 3/3. tachypneic in triage, sats 92-97% appears to be grimacing in pain. Husband says she woke up in excruciating pain EXAM: PORTABLE CHEST - 1 VIEW COMPARISON:  03/27/2020 FINDINGS: Lungs are clear. Heart size and mediastinal  contours are within normal limits. No effusion.  No pneumothorax. Stable changes of cement augmentation, midthoracic spine. Stable cervical fixation hardware. Previous right shoulder arthroplasty. IMPRESSION: No acute cardiopulmonary disease and a. Electronically Signed   By: Lucrezia Europe M.D.   On: 10/17/2020 09:48   ECHOCARDIOGRAM COMPLETE  Result Date: 11/11/2020    ECHOCARDIOGRAM REPORT   Patient Name:   Nicole Bass Date of Exam: 11/11/2020 Medical Rec #:  [id number]         Height:       62.0 in Accession #:    [id number]        Weight:       175.0 lb Date of Birth:  May 31, 1951         BSA:          1.806 m  Patient Age:    70 years          BP:           140/72 mmHg Patient Gender: F                 HR:           80 bpm. Exam Location:  [facility] Procedure: 2D Echo, Color Doppler and Cardiac Doppler Indications:     R55 Syncope  History:         Patient has prior history of Echocardiogram examinations. Risk                  Factors:Hypertension.  Sonographer:     Charmayne Sheer RDCS (AE) Referring Phys:  [id number] Soledad Gerlach NIU Diagnosing Phys: Serafina Royals MD IMPRESSIONS  1. Left ventricular ejection fraction, by estimation, is 50 to 55%. The left ventricle has low normal function. The left ventricle has no regional wall motion abnormalities. Left ventricular diastolic parameters were normal.  2. Right ventricular systolic function is normal. The right ventricular size is normal.  3. The mitral valve is normal in structure. Trivial mitral valve regurgitation.  4. The aortic valve is normal in structure. Aortic valve regurgitation is not visualized. FINDINGS  Left Ventricle: Left ventricular ejection fraction, by estimation, is 50 to 55%. The left ventricle has low normal function. The left ventricle has no regional wall motion abnormalities. The left ventricular internal cavity size was normal in size. There is no left ventricular hypertrophy. Left ventricular diastolic parameters were normal. Right Ventricle: The right ventricular size is normal. No increase in right ventricular wall thickness. Right ventricular systolic function is normal. Left Atrium: Left atrial size was normal in size. Right Atrium: Right atrial size was normal in size. Pericardium: There is no evidence of pericardial effusion. Mitral Valve: The mitral valve is normal in structure. Trivial mitral valve regurgitation. MV peak gradient, 4.2 mmHg. The mean mitral valve gradient is 2.0 mmHg. Tricuspid Valve: The tricuspid valve is normal in structure. Tricuspid valve regurgitation is trivial. Aortic Valve: The aortic valve is normal in structure. Aortic valve  regurgitation is not visualized. Aortic valve mean gradient measures 2.0 mmHg. Aortic valve peak gradient measures 5.1 mmHg. Aortic valve area, by VTI measures 3.78 cm. Pulmonic Valve: The pulmonic valve was normal in structure. Pulmonic valve regurgitation is trivial. Aorta: The aortic root and ascending aorta are structurally normal, with no evidence of dilitation. IAS/Shunts: No atrial level shunt detected by color flow Doppler.  LEFT VENTRICLE PLAX 2D LVIDd:         4.30 cm  Diastology LVIDs:         3.00  cm  LV e' medial:    6.64 cm/s LV PW:         1.10 cm  LV E/e' medial:  10.5 LV IVS:        1.10 cm  LV e' lateral:   5.98 cm/s LVOT diam:     2.40 cm  LV E/e' lateral: 11.7 LV SV:         78 LV SV Index:   43 LVOT Area:     4.52 cm  RIGHT VENTRICLE RV Basal diam:  3.60 cm LEFT ATRIUM             Index LA diam:        4.00 cm 2.21 cm/m LA Vol (A2C):   48.5 ml 26.85 ml/m LA Vol (A4C):   55.4 ml 30.67 ml/m LA Biplane Vol: 52.1 ml 28.84 ml/m  AORTIC VALVE                   PULMONIC VALVE AV Area (Vmax):    3.32 cm    PV Vmax:       0.92 m/s AV Area (Vmean):   3.66 cm    PV Vmean:      65.100 cm/s AV Area (VTI):     3.78 cm    PV VTI:        0.152 m AV Vmax:           113.00 cm/s PV Peak grad:  3.4 mmHg AV Vmean:          73.900 cm/s PV Mean grad:  2.0 mmHg AV VTI:            0.207 m AV Peak Grad:      5.1 mmHg AV Mean Grad:      2.0 mmHg LVOT Vmax:         82.90 cm/s LVOT Vmean:        59.800 cm/s LVOT VTI:          0.173 m LVOT/AV VTI ratio: 0.84  AORTA Ao Root diam: 3.10 cm MITRAL VALVE MV Area (PHT): 5.97 cm    SHUNTS MV Area VTI:   4.09 cm    Systemic VTI:  0.17 m MV Peak grad:  4.2 mmHg    Systemic Diam: 2.40 cm MV Mean grad:  2.0 mmHg MV Vmax:       1.03 m/s MV Vmean:      65.3 cm/s MV Decel Time: 127 msec MV E velocity: 69.80 cm/s MV A velocity: 84.80 cm/s MV E/A ratio:  0.82 Serafina Royals MD Electronically signed by Serafina Royals MD Signature Date/Time: 11/11/2020/5:18:18 PM    Final      Microbiology: Recent Results (from the past 240 hour(s))  SARS CORONAVIRUS 2 (TAT 6-24 HRS) Nasopharyngeal Nasopharyngeal Swab     Status: None   Collection Time: 11/09/20  3:35 PM   Specimen: Nasopharyngeal Swab  Result Value Ref Range Status   SARS Coronavirus 2 NEGATIVE NEGATIVE Final    Comment: (NOTE) SARS-CoV-2 target nucleic acids are NOT DETECTED.  The SARS-CoV-2 RNA is generally detectable in upper and lower respiratory specimens during the acute phase of infection. Negative results do not preclude SARS-CoV-2 infection, do not rule out co-infections with other pathogens, and should not be used as the sole basis for treatment or other patient management decisions. Negative results must be combined with clinical observations, patient history, and epidemiological information. The expected result is Negative.  Fact Sheet for Patients: SugarRoll.be  Fact Sheet  for Healthcare Providers: [email or web address]  This test is not yet approved or cleared by the [place] and  has been authorized for detection and/or diagnosis of SARS-CoV-2 by FDA under an Emergency Use Authorization (EUA). This EUA will remain  in effect (meaning this test can be used) for the duration of the COVID-19 declaration under Se ction 564(b)(1) of the Act, 21 U.S.C. section 360bbb-3(b)(1), unless the authorization is terminated or revoked sooner.  Performed at [facility], [place] [address] [place], [place] [address]      Labs: Basic Metabolic Panel: Recent Labs  Lab 11/09/20 0935 11/09/20 1233 11/10/20 0500 11/11/20 0629 11/12/20 0428  NA 140  --  140 141 142  K 2.5* 2.8* 4.3 4.0 3.9  CL 103  --  106 106 106  CO2 26  --  28 29 29   GLUCOSE 125*  --  124* 92 83  BUN 12  --  10 9 9   CREATININE 0.72  --  0.73 0.71 0.69  CALCIUM 8.8*  --  8.5* 8.4* 8.5*  MG 1.6*  --  1.9  --   --    Liver Function Tests: Recent Labs  Lab  11/09/20 0935 11/12/20 0428  AST 54* 26  ALT 18 12  ALKPHOS 95 79  BILITOT 0.4 0.5  PROT 6.9 5.4*  ALBUMIN 3.2* 2.5*   No results for input(s): LIPASE, AMYLASE in the last 168 hours. No results for input(s): AMMONIA in the last 168 hours. CBC: Recent Labs  Lab 11/09/20 0935 11/10/20 0500 11/11/20 0629 11/12/20 0428  WBC 10.5 9.1 5.8 5.2  HGB 10.0* 8.8* 8.4* 8.6*  HCT 31.3* 28.7* 27.2* 27.1*  MCV 95.1 98.0 98.6 97.8  PLT 538* 426* 340 329   Cardiac Enzymes: No results for input(s): CKTOTAL, CKMB, CKMBINDEX, TROPONINI in the last 168 hours. BNP: BNP (last 3 results) Recent Labs    11/09/20 0925  BNP 94.0       Signed:  Oswald Hillock MD.  [facility] 11/12/2020, 11:41 AM

## 2020-11-12 NOTE — Progress Notes (Signed)
Pt to d/c home per MD order. Discharge instructions give to pt and instructed to follow up with PCP and cardiology. Pt verbalized understanding. IV removed, clear, dry and intact. Tele box removed and returned. Pt awaiting daughter to pick pt up.

## 2020-11-12 NOTE — Progress Notes (Signed)
Pt ambulated around nurses station x2 with walker. Pt tolerated well. No distress noted.

## 2020-12-04 DIAGNOSIS — M7582 Other shoulder lesions, left shoulder: Secondary | ICD-10-CM | POA: Insufficient documentation

## 2020-12-07 ENCOUNTER — Telehealth: Payer: Self-pay

## 2020-12-07 NOTE — Telephone Encounter (Signed)
Patient left a voicemail inquiring about her next appt date. Called patient on 12/07/20, no answer. No vmail available.

## 2020-12-08 ENCOUNTER — Other Ambulatory Visit: Payer: Self-pay | Admitting: Pulmonary Disease

## 2020-12-08 DIAGNOSIS — J479 Bronchiectasis, uncomplicated: Secondary | ICD-10-CM

## 2020-12-14 ENCOUNTER — Ambulatory Visit: Payer: Medicare Other | Admitting: Gastroenterology

## 2020-12-14 ENCOUNTER — Encounter: Payer: Self-pay | Admitting: Gastroenterology

## 2020-12-28 ENCOUNTER — Ambulatory Visit: Payer: Medicare Other | Admitting: Gastroenterology

## 2021-03-17 ENCOUNTER — Other Ambulatory Visit: Payer: Self-pay | Admitting: Gastroenterology

## 2021-03-17 DIAGNOSIS — K50819 Crohn's disease of both small and large intestine with unspecified complications: Secondary | ICD-10-CM

## 2021-03-30 ENCOUNTER — Telehealth (INDEPENDENT_AMBULATORY_CARE_PROVIDER_SITE_OTHER): Payer: Self-pay | Admitting: Vascular Surgery

## 2021-03-30 NOTE — Telephone Encounter (Signed)
Called stating that she has been waiting to receive a call since 07/2020. Patient was told the laser kits were on back order and called today to check the status. I  informed the patient that she may need to reestablish care with provider and possibly need another [place] as well.   Please advise.

## 2021-03-31 NOTE — Telephone Encounter (Signed)
As far as an ultrasound, it hasn't been a year yet so we may be able to get away with the last one.  Nicole Bass, an idea on the status ?

## 2021-04-08 ENCOUNTER — Ambulatory Visit
Admission: RE | Admit: 2021-04-08 | Discharge: 2021-04-08 | Disposition: A | Discharge status: Home / Self Care | Payer: Medicare Other | Source: Ambulatory Visit | Attending: Gastroenterology | Admitting: Gastroenterology

## 2021-04-08 ENCOUNTER — Other Ambulatory Visit: Payer: Self-pay

## 2021-04-08 DIAGNOSIS — K50819 Crohn's disease of both small and large intestine with unspecified complications: Secondary | ICD-10-CM | POA: Insufficient documentation

## 2021-04-08 MED ORDER — IOHEXOL 350 MG/ML SOLN
100.0000 mL | Freq: Once | INTRAVENOUS | Status: AC | PRN
  Administered 2021-04-08: 100 mL via INTRAVENOUS

## 2021-04-30 ENCOUNTER — Ambulatory Visit (INDEPENDENT_AMBULATORY_CARE_PROVIDER_SITE_OTHER): Payer: Medicare Other | Admitting: Vascular Surgery

## 2021-04-30 ENCOUNTER — Other Ambulatory Visit: Payer: Self-pay

## 2021-04-30 ENCOUNTER — Encounter (INDEPENDENT_AMBULATORY_CARE_PROVIDER_SITE_OTHER): Payer: Self-pay | Admitting: Vascular Surgery

## 2021-04-30 VITALS — BP 112/69 | HR 83 | Resp 16 | Wt 163.4 lb

## 2021-04-30 DIAGNOSIS — I10 Essential (primary) hypertension: Secondary | ICD-10-CM | POA: Diagnosis not present

## 2021-04-30 DIAGNOSIS — E1140 Type 2 diabetes mellitus with diabetic neuropathy, unspecified: Secondary | ICD-10-CM

## 2021-04-30 DIAGNOSIS — M7989 Other specified soft tissue disorders: Secondary | ICD-10-CM | POA: Diagnosis not present

## 2021-04-30 DIAGNOSIS — I83812 Varicose veins of left lower extremities with pain: Secondary | ICD-10-CM | POA: Diagnosis not present

## 2021-04-30 NOTE — Assessment & Plan Note (Signed)
blood glucose control important in reducing the progression of atherosclerotic disease. Also, involved in wound healing. On appropriate medications.  

## 2021-04-30 NOTE — Assessment & Plan Note (Signed)
Likely related to her venous disease.  May have a component of lymphedema as well.

## 2021-04-30 NOTE — Assessment & Plan Note (Signed)
blood pressure control important in reducing the progression of atherosclerotic disease. On appropriate oral medications.  

## 2021-04-30 NOTE — Assessment & Plan Note (Signed)
Previously performed venous duplex demonstrated significant left great saphenous vein reflux.  Risks and benefits of this procedure were discussed with the patient in detail and she desires to have it done.  This will be scheduled in the near future at her convenience.

## 2021-04-30 NOTE — Progress Notes (Signed)
MRN : [id number]  Nicole Bass is a 70 y.o. (Sep 02, 1950) female who presents with chief complaint of  Chief Complaint  Patient presents with   Follow-up    Ref [place] PVD  .  History of Present Illness: Patient returns today in follow up of her venous insufficiency.  She is still having worsening problems with pain and swelling in her left leg which have progressed since her last visit.  At her last visit, we discussed laser ablation of the left great saphenous vein for symptomatic reflux was seen on ultrasound.  At that time, the laser kits were on backorder and she was not seen for several months.  She has since had another ultrasound about 5 or 6 months ago which was just a DVT study but was negative for DVT.  She continues to wear her compression socks.  She continues to elevate her legs.  Despite this, her leg swelling and pain persists and has worsened.  A previous reflux study did show significant venous reflux in the left great saphenous vein.  Current Outpatient Medications  Medication Sig Dispense Refill   alendronate (FOSAMAX) 70 MG tablet Take 70 mg by mouth every Monday. Take with a full glass of water on an empty stomach.     aspirin EC 81 MG tablet Take 81 mg by mouth daily. Swallow whole.     betamethasone dipropionate 0.05 % lotion Apply topically 2 (two) times daily as needed (Rash). Apply thin coat to affected areas twice daily for 2 weeks 60 mL 0   cetirizine (ZYRTEC) 10 MG tablet Take 10 mg by mouth daily.     chlorhexidine (PERIDEX) 0.12 % solution Use as directed 15 mLs in the mouth or throat as needed (mouth sores).     cholecalciferol (VITAMIN D3) 25 MCG (1000 UT) tablet Take 1,000 Units by mouth daily.     clindamycin (CLEOCIN) 300 MG capsule Take 300 mg by mouth 3 (three) times daily.     cyanocobalamin (,VITAMIN B-12,) 1000 MCG/ML injection Inject 1,000 mcg into the muscle every 30 (thirty) days.     diazepam (VALIUM) 10 MG tablet Take 1 tablet (10 mg total)  by mouth daily as needed (vaginal pain with intercourse, use before sexual activity per vagina). (Patient taking differently: Take 10 mg by mouth as needed (vaginal pain with intercourse, use before sexual activity per vagina).) 30 tablet 0   diphenoxylate-atropine (LOMOTIL) 2.5-0.025 MG per tablet Take 1 tablet by mouth daily as needed (Crohn's symptoms).     fluconazole (DIFLUCAN) 200 MG tablet Take 400 mg by mouth daily.     gabapentin (NEURONTIN) 100 MG capsule Take 100 mg by mouth every morning.     gabapentin (NEURONTIN) 400 MG capsule Take 400 mg by mouth at bedtime.     lidocaine (XYLOCAINE) 2 % solution Use as directed 15 mLs in the mouth or throat as needed for mouth pain.     magnesium oxide (MAG-OX) 400 MG tablet Take 400 mg by mouth every Monday, Wednesday, and Friday.     Multiple Vitamin (MULTIVITAMIN WITH MINERALS) TABS tablet Take 1 tablet by mouth daily.     omeprazole (PRILOSEC) 40 MG capsule Take 40 mg by mouth daily before breakfast.      predniSONE (DELTASONE) 1 MG tablet Take 3 mg by mouth daily with breakfast.      Probiotic Product (PROBIOTIC PO) Take 1 capsule by mouth daily.      rOPINIRole (REQUIP) 1 MG tablet Take 1  mg by mouth at bedtime as needed (for restless legs).      sucralfate (CARAFATE) 1 g tablet Take 0.5 g by mouth 3 (three) times daily with meals as needed (stomach cramping/Crohn's symptoms).     Tofacitinib Citrate (XELJANZ) 5 MG TABS Take 5 mg by mouth 2 (two) times daily.     HYDROcodone-acetaminophen (NORCO) 10-325 MG tablet Take 1 tablet by mouth every 6 (six) hours as needed for severe pain. (Patient not taking: Reported on 04/30/2021) 20 tablet 0   No current facility-administered medications for this visit.    Past Medical History:  Diagnosis Date   Anemia    iron everyday, 3 iron infusions last one August 2020   Anxiety    nervous   Arthritis    rheumatoid   Collagen vascular disease ([place])    Crohn's disease ([place])    Crohn's disease ([place])     DDD (degenerative disc disease), cervical    DDD (degenerative disc disease), cervical    DDD (degenerative disc disease), cervical 2003   DDD (degenerative disc disease), cervical    DDD (degenerative disc disease), lumbar    Degenerative disc disease, cervical    Dyspnea    out of shape   GERD (gastroesophageal reflux disease)    Hand weakness    left hand worse, small tremors   Headache    with accident   History of claustrophobia    History of kidney stones    HOH (hard of hearing)    left ear   Lymphatic disorder    Meniere's disease    Motion sickness    Nasal fracture 03/27/2020   due to fall   Neuromuscular disorder (HCC)    Neuropathy    legs   Peripheral neuropathy    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[place])    UTI (urinary tract infection)    HO   Vertigo    can be accompanied by nausea   Wears dentures    upper dentures    Past Surgical History:  Procedure Laterality Date   ABDOMINAL HYSTERECTOMY     APPENDECTOMY     BACK SURGERY     x 3   CERVICAL SPINE SURGERY     metal plate   [place]   X 2   CHOLECYSTECTOMY     CLOSED REDUCTION NASAL FRACTURE N/A 04/09/2020   Procedure: CLOSED REDUCTION NASAL FRACTURE;  Surgeon: Margaretha Sheffield, MD;  Location: [facility];  Service: ENT;  Laterality: N/A;   [place]   removed 18 inches of colon,removed appendix   COLONOSCOPY     dental implants     3 teeth/ wears dentures   DILATION AND CURETTAGE OF UTERUS  2004   ELBOW FRACTURE SURGERY Left 2008   FIBEROPTIC BRONCHOSCOPY N/A 06/01/2020   Procedure: BEDSIDE BRONCHOSCOPY FIBEROPTIC;  Surgeon: Ottie Glazier, MD;  Location: [facility];  Service: Thoracic;  Laterality: N/A;   FRACTURE SURGERY     JOINT REPLACEMENT Right 2015   partial knee replacement   KYPHOPLASTY     KYPHOPLASTY N/A 04/14/2015   Procedure: KYPHOPLASTY;  Surgeon: Hessie Knows, MD;  Location: [facility];  Service: Orthopedics;  Laterality: N/A;   KYPHOPLASTY N/A  06/04/2015   Procedure: KYPHOPLASTY L-5;  Surgeon: Hessie Knows, MD;  Location: [facility];  Service: Orthopedics;  Laterality: N/A;   KYPHOPLASTY     LUMBAR LAMINECTOMY FOR EPIDURAL ABSCESS N/A 10/12/2020   Procedure: SYNOVIAL CYST RESECTION;  Surgeon: Deetta Perla,  MD;  Location: [facility];  Service: Neurosurgery;  Laterality: N/A;   MAXIMUM ACCESS (MAS) TRANSFORAMINAL LUMBAR INTERBODY FUSION (TLIF) 1 LEVEL Left 10/12/2020   Procedure: OPEN L5/S1 TRANSFORAMINAL LUMBAR INTERBODY FUSION (TLIF) 1 LEVEL;  Surgeon: Deetta Perla, MD;  Location: [facility];  Service: Neurosurgery;  Laterality: Left;   MEDIAL PARTIAL KNEE REPLACEMENT Right 2015   NECK/PLATE  7353   OOPHORECTOMY Bilateral    ORIF NASAL FRACTURE N/A 04/09/2020   Procedure: OPEN REDUCTION INTERNAL FIXATION (ORIF) NASAL FRACTURE;  Surgeon: Margaretha Sheffield, MD;  Location: [facility];  Service: ENT;  Laterality: N/A;   REPAIR EXTENSOR TENDON Left 01/08/2019   Procedure: Realignment  EXTENSOR TENDON;  Surgeon: Hessie Knows, MD;  Location: [facility];  Service: Orthopedics;  Laterality: Left;   REVERSE SHOULDER ARTHROPLASTY Right 04/18/2019   Procedure: REVERSE SHOULDER ARTHROPLASTY;  Surgeon: Corky Mull, MD;  Location: [facility];  Service: Orthopedics;  Laterality: Right;   SHOULDER ARTHROSCOPY WITH ROTATOR CUFF REPAIR AND SUBACROMIAL DECOMPRESSION Right 04/13/2016   Procedure: SHOULDER ARTHROSCOPY WITH ROTATOR CUFF REPAIR AND SUBACROMIAL DECOMPRESSION, release of long head biceps tendon;  Surgeon: Leanor Kail, MD;  Location: [facility];  Service: Orthopedics;  Laterality: Right;   TOE SURGERY Right 2013   TONSILLECTOMY  1995   UPPER GI ENDOSCOPY     WRIST FRACTURE SURGERY Bilateral      Social History   Tobacco Use   Smoking status: Never   Smokeless tobacco: Never  Vaping Use   Vaping Use: Never used  Substance Use Topics   Alcohol use: No   Drug use: No       Family History  Problem Relation Age of Onset   Diabetes  Mother    Emphysema Mother    Emphysema Father    Colon cancer Maternal Grandfather    Breast cancer Neg Hx    Ovarian cancer Neg Hx      Allergies  Allergen Reactions   Ciprofloxacin Itching and Rash   Levofloxacin Hives and Rash   Methotrexate     Other reaction(s): Other (See Comments) Mouth Ulcers/[place] Mouth and throat ulcers   Methotrexate Derivatives     Mouth and throat ulcers   Other Other (See Comments) and Rash    Other reaction(s): Other (See Comments), Unknown Per pts MD she is unable to take this medication because it is contraindicated with her other medications.   Per pts MD she is unable to take this medication because it is contraindicated with her other medications.   Other reaction(s): Other (See Comments), Unknown Other reaction(s): Other (See Comments), Unknown Per pts MD she is unable to take this medication because it is contraindicated with her other medications.   Per pts MD she is unable to take this medication because it is contraindicated with her other medications.   Other reaction(s): Other (See Comments), Unknown Other reaction(s): Other (See Comments), Unknown Per pts MD she is unable to take this medication because it is contraindicated with her  Per pts MD she is unable to take this medication because it is contraindicated with her other medications.   Other reaction(s): Other (See Comments) Other reaction(s): Other (See Comments), Unknown Per pts MD she is unable to take this medication because it is contraindicated with her other medications.   Per pts MD she is unable to take this medication because it is contraindicated with her other medications.   Other reaction(s): Other (See Comments), Unknown Other reaction(s): Other (See Comments), Unknown Per pts MD  she is unable to take this medication because it is contraindicated with her other medications.   Per pts MD she is unable to take this medication because it is contraindicated with her  other medications.   Other reaction(s): Other (See Comments), Unknown Other reaction(s): Other (See Comments), Unknown Per pts MD she is unable to take this medication because it is contraindicated with her  Per pts MD she is unable to take this medication because it is contraindicated with her other medications.     Sulfa Antibiotics Other (See Comments)    Per pts MD she is unable to take this medication because it is contraindicated with her other medications.     Trelegy Ellipta [Fluticasone-Umeclidin-Vilant]     Laryngitis    Cefazolin Itching and Rash   Cefprozil Rash   Cephalosporins Itching and Rash   Doxycycline Rash   Enbrel [Etanercept] Itching and Rash   Humira [Adalimumab] Itching and Rash   Hydrocodone-Acetaminophen Itching and Other (See Comments)    Itching only   Lorabid [Loracarbef] Itching and Rash   Meropenem Itching and Rash   Oxycodone Itching, Other (See Comments) and Rash    Other reaction(s): Other (See Comments)   Oxycodone-Acetaminophen Itching   Primidone Itching and Rash     REVIEW OF SYSTEMS (Negative unless checked)  Constitutional: [] Weight loss  [] Fever  [] Chills Cardiac: [] Chest pain   [] Chest pressure   [] Palpitations   [] Shortness of breath when laying flat   [] Shortness of breath at rest   [] Shortness of breath with exertion. Vascular:  [x] Pain in legs with walking   [x] Pain in legs at rest   [] Pain in legs when laying flat   [] Claudication   [] Pain in feet when walking  [] Pain in feet at rest  [] Pain in feet when laying flat   [] History of DVT   [] Phlebitis   [x] Swelling in legs   [x] Varicose veins   [] Non-healing ulcers Pulmonary:   [] Uses home oxygen   [] Productive cough   [] Hemoptysis   [] Wheeze  [] COPD   [] Asthma Neurologic:  [] Dizziness  [] Blackouts   [] Seizures   [] History of stroke   [] History of TIA  [] Aphasia   [] Temporary blindness   [] Dysphagia   [] Weakness or numbness in arms   [] Weakness or numbness in legs Musculoskeletal:   [x] Arthritis   [] Joint swelling   [] Joint pain   [] Low back pain Hematologic:  [] Easy bruising  [] Easy bleeding   [] Hypercoagulable state   [x] Anemic   Gastrointestinal:  [] Blood in stool   [] Vomiting blood  [] Gastroesophageal reflux/heartburn   [] Abdominal pain Genitourinary:  [] Chronic kidney disease   [] Difficult urination  [] Frequent urination  [] Burning with urination   [] Hematuria Skin:  [] Rashes   [] Ulcers   [] Wounds Psychological:  [x] History of anxiety   []  History of major depression.  Physical Examination  BP 112/69 (BP Location: Right Arm)   Pulse 83   Resp 16   Wt 163 lb 6.4 oz (74.1 kg)   BMI 29.89 kg/m  Gen:  WD/WN, NAD Head: [place]/AT, No temporalis wasting. Ear/Nose/Throat: Hearing grossly intact, nares w/o erythema or drainage Eyes: Conjunctiva clear. Sclera non-icteric Neck: Supple.  Trachea midline Pulmonary:  Good air movement, no use of accessory muscles.  Cardiac: RRR, no JVD Vascular:  Vessel Right Left  Radial Palpable Palpable                          PT Palpable Palpable  DP Palpable Palpable  Gastrointestinal: soft, non-tender/non-distended. No guarding/reflex.  Musculoskeletal: M/S 5/5 throughout.  No deformity or atrophy.  Diffuse varicosities bilaterally.  No right lower extremity edema.  1+ left lower extremity edema. Neurologic: Sensation grossly intact in extremities.  Symmetrical.  Speech is fluent.  Psychiatric: Judgment intact, Mood & affect appropriate for pt's clinical situation. Dermatologic: No rashes or ulcers noted.  No cellulitis or open wounds.      Labs No results found for this or any previous visit (from the past 2160 hour(s)).  Radiology CT ENTERO ABD/PELVIS W CONTAST  Result Date: 04/09/2021 CLINICAL DATA:  Routine imaging of Crohn disease. No specific symptoms submitted. EXAM: CT ABDOMEN AND PELVIS WITH CONTRAST (ENTEROGRAPHY) TECHNIQUE: Multidetector CT of the abdomen and pelvis during bolus administration of  intravenous contrast. Negative oral contrast was given. CONTRAST:  128m OMNIPAQUE IOHEXOL 350 MG/ML SOLN COMPARISON:  Plain film 06/17/2020.  09/04/2015 abdominopelvic CT FINDINGS: Lower chest: Clear lung bases. Normal heart size without pericardial or pleural effusion. Hepatobiliary: Normal liver. Cholecystectomy, without biliary ductal dilatation. Pancreas: Normal, without mass or ductal dilatation. Spleen: Normal in size, without focal abnormality. Adrenals/Urinary Tract: Normal adrenal glands. Bilateral extrarenal pelves. Mild renal cortical thinning bilaterally. Normal urinary bladder. Stomach/Bowel: Normal stomach, without wall thickening. Fluid-filled colon, likely physiologic after neutral contrast administration. Apparent soft tissue thickening in the ascending colon on 108/3 is favored to be due to underdistention. Surgical changes, likely of ileocecectomy. The extent of small bowel distension with neutral contrast is moderate. The neo terminal ileum is normal including on 119/3 and 31/6. No evidence of small bowel mass, inflammation, mucosal hyperenhancement, obstruction, or stricture. Vascular/Lymphatic: Aortic atherosclerosis. Multiple left renal arteries. Small bowel mesenteric soft tissue thickening is decreased, including on 45/2. No abdominopelvic adenopathy. Reproductive: Hysterectomy.  No adnexal mass. Other: No significant free fluid. Mild pelvic floor laxity. No free intraperitoneal air. Musculoskeletal: No evidence of sacroiliitis. Mild osteopenia. Lumbosacral trans pedicle screw fixation. Vertebral augmentation at L5 and L1. IMPRESSION: 1. Status post ileocecetomy, without evidence of active Crohn disease or complication. 2.  No acute process in the abdomen or pelvis. 3.  Aortic Atherosclerosis (ICD10-I70.0). Electronically Signed   By: KAbigail MiyamotoM.D.   On: 04/09/2021 08:51    Assessment/Plan  Varicose veins of leg with pain, left Previously performed venous duplex demonstrated  significant left great saphenous vein reflux.  Risks and benefits of this procedure were discussed with the patient in detail and she desires to have it done.  This will be scheduled in the near future at her convenience.  HTN, goal below 140/80 blood pressure control important in reducing the progression of atherosclerotic disease. On appropriate oral medications.   Type 2 diabetes mellitus with diabetic neuropathy (HCC) blood glucose control important in reducing the progression of atherosclerotic disease. Also, involved in wound healing. On appropriate medications.   Swelling of limb Likely related to her venous disease.  May have a component of lymphedema as well.    JLeotis Pain MD  04/30/2021 10:19 AM    This note was created with Dragon medical transcription system.  Any errors from dictation are purely unintentional

## 2021-05-10 NOTE — Telephone Encounter (Signed)
Patient called again to check the status of laser

## 2021-05-21 ENCOUNTER — Other Ambulatory Visit: Payer: Self-pay | Admitting: Internal Medicine

## 2021-05-21 DIAGNOSIS — Z1231 Encounter for screening mammogram for malignant neoplasm of breast: Secondary | ICD-10-CM

## 2021-06-09 ENCOUNTER — Ambulatory Visit: Admission: RE | Admit: 2021-06-09 | Payer: Medicare Other | Source: Ambulatory Visit

## 2021-06-10 ENCOUNTER — Ambulatory Visit: Admission: RE | Admit: 2021-06-10 | Payer: Medicare Other | Source: Ambulatory Visit

## 2021-06-15 NOTE — Progress Notes (Signed)
06/16/21 11:40 AM   Nicole Bass May 13, 1951 [id number]  Referring provider:  [name], MD [facility] [place],  [place] [address] Chief Complaint  Patient presents with   Nephrolithiasis     HPI: Nicole Bass is a 71 y.o.female with a personal history of weak stream, history of kidney stones, and recurrent UTIs, who presents today for 1 year follow-up with KUB prior.   No obvious stones revealed on KUB today for on CT scan a few months ago (CT abd/ pelvis with contrast).  She reports today that she is doing well in a urological aspect. She has not had any recent UTIs. She is concerned today by hair loss.   She also is concerned about pain during sexual intercourse. She reports she feels tearing at the opening.  They are using a water-soluble lubricant.  She did not have this issue with her 2 previous husbands.  She tried vaginal Valium but did not see much benefit.  She does not currently see a gynecologist.   PMH: Past Medical History:  Diagnosis Date   Anemia    iron everyday, 3 iron infusions last one August 2020   Anxiety    nervous   Arthritis    rheumatoid   Collagen vascular disease ([place])    Crohn's disease ([place])    Crohn's disease ([place])    DDD (degenerative disc disease), cervical    DDD (degenerative disc disease), cervical    DDD (degenerative disc disease), cervical 2003   DDD (degenerative disc disease), cervical    DDD (degenerative disc disease), lumbar    Degenerative disc disease, cervical    Dyspnea    out of shape   GERD (gastroesophageal reflux disease)    Hand weakness    left hand worse, small tremors   Headache    with accident   History of claustrophobia    History of kidney stones    HOH (hard of hearing)    left ear   Lymphatic disorder    Meniere's disease    Motion sickness    Nasal fracture 03/27/2020   due to fall   Neuromuscular disorder (HCC)    Neuropathy    legs   Peripheral  neuropathy    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[place])    UTI (urinary tract infection)    HO   Vertigo    can be accompanied by nausea   Wears dentures    upper dentures    Surgical History: Past Surgical History:  Procedure Laterality Date   ABDOMINAL HYSTERECTOMY     APPENDECTOMY     BACK SURGERY     x 3   CERVICAL SPINE SURGERY     metal plate   [place]   X 2   CHOLECYSTECTOMY     CLOSED REDUCTION NASAL FRACTURE N/A 04/09/2020   Procedure: CLOSED REDUCTION NASAL FRACTURE;  Surgeon: Margaretha Sheffield, MD;  Location: [place];  Service: ENT;  Laterality: N/A;   [place]   removed 18 inches of colon,removed appendix   COLONOSCOPY     dental implants     3 teeth/ wears dentures   DILATION AND CURETTAGE OF UTERUS  2004   ELBOW FRACTURE SURGERY Left 2008   FIBEROPTIC BRONCHOSCOPY N/A 06/01/2020   Procedure: BEDSIDE BRONCHOSCOPY FIBEROPTIC;  Surgeon: Ottie Glazier, MD;  Location: [facility];  Service: Thoracic;  Laterality: N/A;   FRACTURE SURGERY     JOINT  REPLACEMENT Right 2015   partial knee replacement   KYPHOPLASTY     KYPHOPLASTY N/A 04/14/2015   Procedure: KYPHOPLASTY;  Surgeon: Hessie Knows, MD;  Location: [facility];  Service: Orthopedics;  Laterality: N/A;   KYPHOPLASTY N/A 06/04/2015   Procedure: KYPHOPLASTY L-5;  Surgeon: Hessie Knows, MD;  Location: [facility];  Service: Orthopedics;  Laterality: N/A;   KYPHOPLASTY     LUMBAR LAMINECTOMY FOR EPIDURAL ABSCESS N/A 10/12/2020   Procedure: SYNOVIAL CYST RESECTION;  Surgeon: Deetta Perla, MD;  Location: [facility];  Service: Neurosurgery;  Laterality: N/A;   MAXIMUM ACCESS (MAS) TRANSFORAMINAL LUMBAR INTERBODY FUSION (TLIF) 1 LEVEL Left 10/12/2020   Procedure: OPEN L5/S1 TRANSFORAMINAL LUMBAR INTERBODY FUSION (TLIF) 1 LEVEL;  Surgeon: Deetta Perla, MD;  Location: [facility];  Service: Neurosurgery;  Laterality: Left;   MEDIAL PARTIAL KNEE REPLACEMENT Right 2015   NECK/PLATE  4680   OOPHORECTOMY  Bilateral    ORIF NASAL FRACTURE N/A 04/09/2020   Procedure: OPEN REDUCTION INTERNAL FIXATION (ORIF) NASAL FRACTURE;  Surgeon: Margaretha Sheffield, MD;  Location: [place];  Service: ENT;  Laterality: N/A;   REPAIR EXTENSOR TENDON Left 01/08/2019   Procedure: Realignment  EXTENSOR TENDON;  Surgeon: Hessie Knows, MD;  Location: [facility];  Service: Orthopedics;  Laterality: Left;   REVERSE SHOULDER ARTHROPLASTY Right 04/18/2019   Procedure: REVERSE SHOULDER ARTHROPLASTY;  Surgeon: Corky Mull, MD;  Location: [facility];  Service: Orthopedics;  Laterality: Right;   SHOULDER ARTHROSCOPY WITH ROTATOR CUFF REPAIR AND SUBACROMIAL DECOMPRESSION Right 04/13/2016   Procedure: SHOULDER ARTHROSCOPY WITH ROTATOR CUFF REPAIR AND SUBACROMIAL DECOMPRESSION, release of long head biceps tendon;  Surgeon: Leanor Kail, MD;  Location: [facility];  Service: Orthopedics;  Laterality: Right;   TOE SURGERY Right 2013   TONSILLECTOMY  1995   UPPER GI ENDOSCOPY     WRIST FRACTURE SURGERY Bilateral     Home Medications:  Allergies as of 06/16/2021       Reactions   Ciprofloxacin Itching, Rash   Levofloxacin Hives, Rash   Methotrexate    Other reaction(s): Other (See Comments) Mouth Ulcers/[place] Mouth and throat ulcers   Methotrexate Derivatives    Mouth and throat ulcers   Other Other (See Comments), Rash   Other reaction(s): Other (See Comments), Unknown Per pts MD she is unable to take this medication because it is contraindicated with her other medications.   Per pts MD she is unable to take this medication because it is contraindicated with her other medications.   Other reaction(s): Other (See Comments), Unknown Other reaction(s): Other (See Comments), Unknown Per pts MD she is unable to take this medication because it is contraindicated with her other medications.   Per pts MD she is unable to take this medication because it is contraindicated with her other medications.   Other reaction(s): Other  (See Comments), Unknown Other reaction(s): Other (See Comments), Unknown Per pts MD she is unable to take this medication because it is contraindicated with her  Per pts MD she is unable to take this medication because it is contraindicated with her other medications.   Other reaction(s): Other (See Comments) Other reaction(s): Other (See Comments), Unknown Per pts MD she is unable to take this medication because it is contraindicated with her other medications.   Per pts MD she is unable to take this medication because it is contraindicated with her other medications.   Other reaction(s): Other (See Comments), Unknown Other reaction(s): Other (See Comments), Unknown Per pts MD she is unable  to take this medication because it is contraindicated with her other medications.   Per pts MD she is unable to take this medication because it is contraindicated with her other medications.   Other reaction(s): Other (See Comments), Unknown Other reaction(s): Other (See Comments), Unknown Per pts MD she is unable to take this medication because it is contraindicated with her  Per pts MD she is unable to take this medication because it is contraindicated with her other medications.     Sulfa Antibiotics Other (See Comments)   Per pts MD she is unable to take this medication because it is contraindicated with her other medications.     Trelegy Ellipta [fluticasone-umeclidin-vilant]    Laryngitis    Cefazolin Itching, Rash   Cefprozil Rash   Cephalosporins Itching, Rash   Doxycycline Rash   Enbrel [etanercept] Itching, Rash   Humira [adalimumab] Itching, Rash   Hydrocodone-acetaminophen Itching, Other (See Comments)   Itching only   Lorabid [loracarbef] Itching, Rash   Meropenem Itching, Rash   Oxycodone Itching, Other (See Comments), Rash   Other reaction(s): Other (See Comments)   Oxycodone-acetaminophen Itching   Primidone Itching, Rash        Medication List        Accurate as of  June 16, 2021 11:40 AM. If you have any questions, ask your nurse or doctor.          STOP taking these medications    diphenoxylate-atropine 2.5-0.025 MG tablet Commonly known as: LOMOTIL Stopped by: Hollice Espy, MD       TAKE these medications    alendronate 70 MG tablet Commonly known as: FOSAMAX Take 70 mg by mouth every Monday. Take with a full glass of water on an empty stomach.   aspirin EC 81 MG tablet Take 81 mg by mouth daily. Swallow whole.   betamethasone dipropionate 0.05 % lotion Apply topically 2 (two) times daily as needed (Rash). Apply thin coat to affected areas twice daily for 2 weeks   cetirizine 10 MG tablet Commonly known as: ZYRTEC Take 10 mg by mouth daily.   chlorhexidine 0.12 % solution Commonly known as: PERIDEX Use as directed 15 mLs in the mouth or throat as needed (mouth sores).   cholecalciferol 25 MCG (1000 UNIT) tablet Commonly known as: VITAMIN D3 Take 1,000 Units by mouth daily.   clindamycin 300 MG capsule Commonly known as: CLEOCIN Take 300 mg by mouth 3 (three) times daily.   cyanocobalamin 1000 MCG/ML injection Commonly known as: (VITAMIN B-12) Inject 1,000 mcg into the muscle every 30 (thirty) days.   diazepam 10 MG tablet Commonly known as: Valium Take 1 tablet (10 mg total) by mouth daily as needed (vaginal pain with intercourse, use before sexual activity per vagina). What changed: when to take this   fluconazole 200 MG tablet Commonly known as: DIFLUCAN Take 400 mg by mouth daily.   gabapentin 400 MG capsule Commonly known as: NEURONTIN Take 400 mg by mouth at bedtime.   gabapentin 100 MG capsule Commonly known as: NEURONTIN Take 100 mg by mouth every morning.   HYDROcodone-acetaminophen 10-325 MG tablet Commonly known as: NORCO Take 1 tablet by mouth every 6 (six) hours as needed for severe pain.   lidocaine 2 % solution Commonly known as: XYLOCAINE Use as directed 15 mLs in the mouth or throat as  needed for mouth pain.   magnesium oxide 400 MG tablet Commonly known as: MAG-OX Take 400 mg by mouth every Monday, Wednesday, and Friday.  multivitamin with minerals Tabs tablet Take 1 tablet by mouth daily.   omeprazole 40 MG capsule Commonly known as: PRILOSEC Take 40 mg by mouth daily before breakfast.   predniSONE 1 MG tablet Commonly known as: DELTASONE Take 3 mg by mouth daily with breakfast.   PROBIOTIC PO Take 1 capsule by mouth daily.   rOPINIRole 1 MG tablet Commonly known as: REQUIP Take 1 mg by mouth at bedtime as needed (for restless legs).   sucralfate 1 g tablet Commonly known as: CARAFATE Take 0.5 g by mouth 3 (three) times daily with meals as needed (stomach cramping/Crohn's symptoms).   Xeljanz 5 MG Tabs Generic drug: Tofacitinib Citrate Take 5 mg by mouth 2 (two) times daily.        Allergies:  Allergies  Allergen Reactions   Ciprofloxacin Itching and Rash   Levofloxacin Hives and Rash   Methotrexate     Other reaction(s): Other (See Comments) Mouth Ulcers/[place] Mouth and throat ulcers   Methotrexate Derivatives     Mouth and throat ulcers   Other Other (See Comments) and Rash    Other reaction(s): Other (See Comments), Unknown Per pts MD she is unable to take this medication because it is contraindicated with her other medications.   Per pts MD she is unable to take this medication because it is contraindicated with her other medications.   Other reaction(s): Other (See Comments), Unknown Other reaction(s): Other (See Comments), Unknown Per pts MD she is unable to take this medication because it is contraindicated with her other medications.   Per pts MD she is unable to take this medication because it is contraindicated with her other medications.   Other reaction(s): Other (See Comments), Unknown Other reaction(s): Other (See Comments), Unknown Per pts MD she is unable to take this medication because it is contraindicated with her  Per  pts MD she is unable to take this medication because it is contraindicated with her other medications.   Other reaction(s): Other (See Comments) Other reaction(s): Other (See Comments), Unknown Per pts MD she is unable to take this medication because it is contraindicated with her other medications.   Per pts MD she is unable to take this medication because it is contraindicated with her other medications.   Other reaction(s): Other (See Comments), Unknown Other reaction(s): Other (See Comments), Unknown Per pts MD she is unable to take this medication because it is contraindicated with her other medications.   Per pts MD she is unable to take this medication because it is contraindicated with her other medications.   Other reaction(s): Other (See Comments), Unknown Other reaction(s): Other (See Comments), Unknown Per pts MD she is unable to take this medication because it is contraindicated with her  Per pts MD she is unable to take this medication because it is contraindicated with her other medications.     Sulfa Antibiotics Other (See Comments)    Per pts MD she is unable to take this medication because it is contraindicated with her other medications.     Trelegy Ellipta [Fluticasone-[place]clidin-Vilant]     Laryngitis    Cefazolin Itching and Rash   Cefprozil Rash   Cephalosporins Itching and Rash   Doxycycline Rash   Enbrel [Etanercept] Itching and Rash   Humira [Adalimumab] Itching and Rash   Hydrocodone-Acetaminophen Itching and Other (See Comments)    Itching only   Lorabid [Loracarbef] Itching and Rash   Meropenem Itching and Rash   Oxycodone Itching, Other (See Comments) and Rash  Other reaction(s): Other (See Comments)   Oxycodone-Acetaminophen Itching   Primidone Itching and Rash    Family History: Family History  Problem Relation Age of Onset   Diabetes Mother    Emphysema Mother    Emphysema Father    Colon cancer Maternal Grandfather    Breast cancer Neg Hx     Ovarian cancer Neg Hx     Social History:  reports that she has never smoked. She has never used smokeless tobacco. She reports that she does not drink alcohol and does not use drugs.   Physical Exam: BP (!) 155/83   Pulse 90   Ht 5' 2"  (1.575 m)   Wt 163 lb (73.9 kg)   BMI 29.81 kg/m   Constitutional:  Alert and oriented, No acute distress. HEENT: [place] AT, moist mucus membranes.  Trachea midline, no masses. Cardiovascular: No clubbing, cyanosis, or edema. Respiratory: Normal respiratory effort, no increased work of breathing. Skin: No rashes, bruises or suspicious lesions. Neurologic: Grossly intact, no focal deficits, moving all 4 extremities. Psychiatric: Normal mood and affect.  Laboratory Data: Lab Results  Component Value Date   CREATININE 0.69 11/12/2020   KUB was personally reviewed today.  No obvious stone burden but may be obscured by bowel.  Previous CT scan from 03/2021 also personally reviewed and no obvious stone burden although this was with contrast which may also obscure stone.   Assessment & Plan:    History of kidney stones  - Not visible on KUB or most recent CT scan which is reassuring; may still be present but not likely clinically significant -Recommend stone diet and observation  2. Dyspareunia    - Will try estrogen cream. Apply 3 times a week with applicator   - Strongly encourage her to follow-up with OB-GYN if symptoms worsen or improve versus physical therapy vs. Vaginal dilators  3. rUTIs - No recurrent UTIs this year  -#2 will likely help with this issue as well, discussed data  Follow-up in 2 years or sooner as needed  I,Kailey Littlejohn,acting as a scribe for Hollice Espy, MD.,have documented all relevant documentation on the behalf of Hollice Espy, MD,as directed by  Hollice Espy, MD while in the presence of Hollice Espy, MD.  I have reviewed the above documentation for accuracy and completeness, and I agree with the above.    Hollice Espy, MD  [facility] [address], [place], [place] [address] [phone]

## 2021-06-16 ENCOUNTER — Encounter: Payer: Self-pay | Admitting: Urology

## 2021-06-16 ENCOUNTER — Other Ambulatory Visit: Payer: Self-pay | Admitting: *Deleted

## 2021-06-16 ENCOUNTER — Other Ambulatory Visit: Payer: Self-pay

## 2021-06-16 ENCOUNTER — Ambulatory Visit
Admission: RE | Admit: 2021-06-16 | Discharge: 2021-06-16 | Disposition: A | Discharge status: Home / Self Care | Payer: Medicare Other | Attending: Psychiatry | Admitting: Psychiatry

## 2021-06-16 ENCOUNTER — Ambulatory Visit
Admission: RE | Admit: 2021-06-16 | Discharge: 2021-06-16 | Disposition: A | Discharge status: Home / Self Care | Payer: Medicare Other | Source: Ambulatory Visit | Attending: Urology | Admitting: Urology

## 2021-06-16 ENCOUNTER — Ambulatory Visit: Payer: Medicare Other | Admitting: Urology

## 2021-06-16 VITALS — BP 155/83 | HR 90 | Ht 62.0 in | Wt 163.0 lb

## 2021-06-16 DIAGNOSIS — N200 Calculus of kidney: Secondary | ICD-10-CM

## 2021-06-16 DIAGNOSIS — N941 Unspecified dyspareunia: Secondary | ICD-10-CM

## 2021-06-16 DIAGNOSIS — N390 Urinary tract infection, site not specified: Secondary | ICD-10-CM | POA: Diagnosis not present

## 2021-06-16 MED ORDER — ESTRADIOL 0.1 MG/GM VA CREA: TOPICAL_CREAM | VAGINAL | 12 refills | Status: DC

## 2021-06-28 ENCOUNTER — Other Ambulatory Visit: Payer: Self-pay

## 2021-06-28 ENCOUNTER — Ambulatory Visit: Payer: Medicare Other | Admitting: Dermatology

## 2021-06-28 ENCOUNTER — Ambulatory Visit
Admission: RE | Admit: 2021-06-28 | Discharge: 2021-06-28 | Disposition: A | Discharge status: Home / Self Care | Payer: Medicare Other | Source: Ambulatory Visit | Attending: Internal Medicine | Admitting: Internal Medicine

## 2021-06-28 DIAGNOSIS — I872 Venous insufficiency (chronic) (peripheral): Secondary | ICD-10-CM

## 2021-06-28 DIAGNOSIS — L650 Telogen effluvium: Secondary | ICD-10-CM | POA: Diagnosis not present

## 2021-06-28 DIAGNOSIS — I781 Nevus, non-neoplastic: Secondary | ICD-10-CM

## 2021-06-28 DIAGNOSIS — L739 Follicular disorder, unspecified: Secondary | ICD-10-CM | POA: Diagnosis not present

## 2021-06-28 DIAGNOSIS — Z1231 Encounter for screening mammogram for malignant neoplasm of breast: Secondary | ICD-10-CM | POA: Insufficient documentation

## 2021-06-28 MED ORDER — KETOCONAZOLE 2 % EX SHAM: MEDICATED_SHAMPOO | CUTANEOUS | 5 refills | Status: DC

## 2021-06-28 MED ORDER — CLINDAMYCIN PHOSPHATE 1 % EX SOLN: CUTANEOUS | 3 refills | Status: DC

## 2021-06-28 NOTE — Patient Instructions (Signed)

## 2021-06-28 NOTE — Progress Notes (Signed)
   Follow-Up Visit   Subjective  Nicole Bass is a 70 y.o. female who presents for the following: hair issues (Patient has noticed more hair falling out over the past two months. Had Covid last January recent back surgery in March and she took pain meds and anti-biotics. She also had RSV in Sept and followed by GI virus. ) and irregular lesion (On the nose - has been there for about a year and will not heal. ).  The following portions of the chart were reviewed this encounter and updated as appropriate:   Tobacco  Allergies  Meds  Problems  Med Hx  Surg Hx  Fam Hx     Review of Systems:  No other skin or systemic complaints except as noted in HPI or Assessment and Plan.  Objective  Well appearing patient in no apparent distress; mood and affect are within normal limits.  A focused examination was performed including the face and scalp. Relevant physical exam findings are noted in the Assessment and Plan.  Nose 0.2 cm blanching pink macule.    Scalp Diffuse thinning of hair.  Scalp Crust of the scalp.  L ankle L ankle swelling with history of neuropathy.    Assessment & Plan  Telangiectasia Nose Discussed laser treatment - advised not covered by insurance and is an out-of-pocket cosmetic procedure.   Telogen effluvium -most likely related to multiple stresses such as COVID; back surgery; RSV in September; and GI virus more recently. Scalp Telogen effluvium is a benign, self limited condition causing increased hair shedding usually for several months. It does not progress to baldness, and the hair eventually grows back on its own. It can be triggered by recent illness, recent surgery, thyroid disease, low iron stores, vitamin D deficiency, fad diets or rapid weight loss, hormonal changes such as pregnancy or birth control pills, and some medication. Usually the hair loss starts 2-3 months after the illness or health change. Rarely, it can continue for longer than a  year.  Start Rogaine 5% BID, recommend Biotin 2.4m po QD, and LLL therapy.  Discussed oral Biotin and side effects.  Consider oral minoxidil 2.5 mg  - half a pill a day for hair loss.  Folliculitis Scalp /Rosacea -  Start Clindamycin solution to aa's QD PRN.   Start Ketoconazole 2% shampoo twice per week. Let sit 5-10 minutes before washing out.   Rosacea is a chronic progressive skin condition usually affecting the face of adults, causing redness and/or acne bumps. It is treatable but not curable. It sometimes affects the eyes (ocular rosacea) as well. It may respond to topical and/or systemic medication and can flare with stress, sun exposure, alcohol, exercise and some foods.  Daily application of broad spectrum spf 30+ sunscreen to face is recommended to reduce flares.  clindamycin (CLEOCIN T) 1 % external solution - Scalp Apply to sores on scalp QD PRN.  ketoconazole (NIZORAL) 2 % shampoo - Scalp Shampoo into the scalp let sit 5-10 minutes then wash out. Use twice per week.  Venous stasis dermatitis of left lower extremity L ankle Continue care with vein specialist.   Return in about 3 months (around 09/28/2021) for scalp recheck .  ILuther Redo CMA, am acting as scribe for DSarina Ser MD . Documentation: I have reviewed the above documentation for accuracy and completeness, and I agree with the above.  DSarina Ser MD

## 2021-06-30 ENCOUNTER — Ambulatory Visit
Admission: RE | Admit: 2021-06-30 | Discharge: 2021-06-30 | Disposition: A | Discharge status: Home / Self Care | Payer: Medicare Other | Source: Ambulatory Visit | Attending: Pulmonary Disease | Admitting: Pulmonary Disease

## 2021-06-30 ENCOUNTER — Other Ambulatory Visit: Payer: Self-pay

## 2021-06-30 DIAGNOSIS — J479 Bronchiectasis, uncomplicated: Secondary | ICD-10-CM | POA: Insufficient documentation

## 2021-06-30 MED ORDER — IOHEXOL 300 MG/ML  SOLN
75.0000 mL | Freq: Once | INTRAMUSCULAR | Status: AC | PRN
  Administered 2021-06-30: 60 mL via INTRAVENOUS

## 2021-07-04 ENCOUNTER — Encounter: Payer: Self-pay | Admitting: Dermatology

## 2021-07-05 ENCOUNTER — Ambulatory Visit: Admission: RE | Admit: 2021-07-05 | Payer: Medicare Other | Source: Home / Self Care

## 2021-07-05 ENCOUNTER — Encounter: Admission: RE | Payer: Self-pay | Source: Home / Self Care

## 2021-07-05 SURGERY — COLONOSCOPY WITH PROPOFOL
Anesthesia: General

## 2021-07-06 ENCOUNTER — Other Ambulatory Visit: Payer: Self-pay | Admitting: Infectious Diseases

## 2021-07-06 DIAGNOSIS — K50819 Crohn's disease of both small and large intestine with unspecified complications: Secondary | ICD-10-CM

## 2021-07-06 DIAGNOSIS — B450 Pulmonary cryptococcosis: Secondary | ICD-10-CM

## 2021-07-06 HISTORY — PX: CATARACT EXTRACTION W/ INTRAOCULAR LENS IMPLANT: SHX1309

## 2021-07-21 ENCOUNTER — Ambulatory Visit: Payer: Medicare Other | Admitting: Dermatology

## 2021-07-27 HISTORY — PX: CATARACT EXTRACTION W/ INTRAOCULAR LENS IMPLANT: SHX1309

## 2021-07-30 ENCOUNTER — Encounter: Payer: Self-pay | Admitting: *Deleted

## 2021-08-02 ENCOUNTER — Ambulatory Visit
Admission: RE | Admit: 2021-08-02 | Discharge: 2021-08-02 | Disposition: A | Discharge status: Home / Self Care | Payer: Medicare Other | Attending: Gastroenterology | Admitting: Gastroenterology

## 2021-08-02 ENCOUNTER — Ambulatory Visit: Payer: Medicare Other | Admitting: Anesthesiology

## 2021-08-02 ENCOUNTER — Other Ambulatory Visit: Payer: Self-pay

## 2021-08-02 ENCOUNTER — Encounter: Payer: Self-pay | Admitting: *Deleted

## 2021-08-02 ENCOUNTER — Encounter
Admission: RE | Disposition: A | Discharge status: Home / Self Care | Payer: Self-pay | Source: Home / Self Care | Attending: Gastroenterology

## 2021-08-02 DIAGNOSIS — E119 Type 2 diabetes mellitus without complications: Secondary | ICD-10-CM | POA: Diagnosis not present

## 2021-08-02 DIAGNOSIS — Z79899 Other long term (current) drug therapy: Secondary | ICD-10-CM | POA: Insufficient documentation

## 2021-08-02 DIAGNOSIS — K5000 Crohn's disease of small intestine without complications: Secondary | ICD-10-CM | POA: Insufficient documentation

## 2021-08-02 DIAGNOSIS — Z7984 Long term (current) use of oral hypoglycemic drugs: Secondary | ICD-10-CM | POA: Diagnosis not present

## 2021-08-02 DIAGNOSIS — Z800 Family history of malignant neoplasm of digestive organs: Secondary | ICD-10-CM | POA: Diagnosis not present

## 2021-08-02 DIAGNOSIS — K640 First degree hemorrhoids: Secondary | ICD-10-CM | POA: Diagnosis not present

## 2021-08-02 DIAGNOSIS — K219 Gastro-esophageal reflux disease without esophagitis: Secondary | ICD-10-CM | POA: Diagnosis not present

## 2021-08-02 DIAGNOSIS — I10 Essential (primary) hypertension: Secondary | ICD-10-CM | POA: Diagnosis not present

## 2021-08-02 HISTORY — DX: Fatty (change of) liver, not elsewhere classified: K76.0

## 2021-08-02 HISTORY — DX: Crohn's disease, unspecified, without complications: K50.90

## 2021-08-02 HISTORY — PX: COLONOSCOPY WITH PROPOFOL: SHX5780

## 2021-08-02 HISTORY — DX: Depression, unspecified: F32.A

## 2021-08-02 HISTORY — DX: Immunodeficiency, unspecified: D84.9

## 2021-08-02 HISTORY — DX: Diarrhea, unspecified: R19.7

## 2021-08-02 HISTORY — DX: Tubulo-interstitial nephritis, not specified as acute or chronic: N12

## 2021-08-02 HISTORY — DX: Personal history of other infectious and parasitic diseases: Z86.19

## 2021-08-02 SURGERY — COLONOSCOPY WITH PROPOFOL
Anesthesia: General

## 2021-08-02 MED ORDER — PROPOFOL 500 MG/50ML IV EMUL
INTRAVENOUS | Status: AC
  Filled 2021-08-02: qty 50

## 2021-08-02 MED ORDER — LIDOCAINE HCL (CARDIAC) PF 100 MG/5ML IV SOSY
PREFILLED_SYRINGE | INTRAVENOUS | Status: DC | PRN
  Administered 2021-08-02: 50 mg via INTRAVENOUS

## 2021-08-02 MED ORDER — PROPOFOL 10 MG/ML IV BOLUS
INTRAVENOUS | Status: DC | PRN
  Administered 2021-08-02: 50 mg via INTRAVENOUS

## 2021-08-02 MED ORDER — SODIUM CHLORIDE 0.9 % IV SOLN: INTRAVENOUS | Status: DC

## 2021-08-02 MED ORDER — PROPOFOL 500 MG/50ML IV EMUL
INTRAVENOUS | Status: DC | PRN
  Administered 2021-08-02: 150 ug/kg/min via INTRAVENOUS

## 2021-08-02 NOTE — Anesthesia Procedure Notes (Signed)
Date/Time: 08/02/2021 8:38 AM Performed by: Johnna Acosta, CRNA Pre-anesthesia Checklist: Patient identified, Emergency Drugs available, Suction available, Patient being monitored and Timeout performed Patient Re-evaluated:Patient Re-evaluated prior to induction Oxygen Delivery Method: Nasal cannula Preoxygenation: Pre-oxygenation with 100% oxygen Induction Type: IV induction

## 2021-08-02 NOTE — Anesthesia Preprocedure Evaluation (Signed)
Anesthesia Evaluation  Patient identified by MRN, date of birth, ID band Patient awake    Reviewed: Allergy & Precautions, NPO status , Patient's Chart, lab work & pertinent test results  History of Anesthesia Complications Negative for: history of anesthetic complications (Slow Emergence)  Airway Mallampati: III       Dental  (+) Upper Dentures   Pulmonary shortness of breath, neg sleep apnea, pneumonia, resolved, neg COPD, Not current smoker,    Pulmonary exam normal        Cardiovascular hypertension, Pt. on medications + CAD  (-) Past MI and (-) CHF Normal cardiovascular exam(-) dysrhythmias (-) Valvular Problems/Murmurs     Neuro/Psych  Headaches, neg Seizures PSYCHIATRIC DISORDERS Anxiety Depression  Neuromuscular disease    GI/Hepatic Neg liver ROS, Bowel prep,GERD  Medicated and Controlled,  Endo/Other  diabetes, Type 2, Oral Hypoglycemic AgentsHyperthyroidism   Renal/GU Renal disease     Musculoskeletal  (+) Arthritis , Osteoarthritis,    Abdominal   Peds  Hematology  (+) anemia ,   Anesthesia Other Findings  Acute diarrhea, unspecified 09/06/2016   Allergic state   Anemia   Anesthesia complication [id number]  slow to wake up after back surgery   Arthritis   Chicken pox   Crohn's disease (CMS-HCC)   Deaf, 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   Depression   DJD (degenerative joint disease)   Exertional dyspnea   Fatigue  exertional   Fatty infiltration of liver   GERD (gastroesophageal reflux disease)   Hearing aid worn  Bilateral, deaf in left ear   Hearing aid worn   Hemorrhoids   History of blood transfusion 1976  no adverse reactions   History of pyelonephritis 09/06/2016   Hypokalemia 09/06/2016   Immune deficiency disorder, disease or syndrome (CMS-HCC)   Kidney stones   Meniere disease   Migraines   Osteoporosis, post-menopausal   Rheumatoid arthritis (CMS-HCC)    Rheumatoid arthritis, adult (CMS-HCC)   Sclerosing mesenteric fibrosis (CMS-HCC)   Sclerosing mesenteric fibrosis (CMS-HCC)   Sensory neuropathy   Sensory neuropathy   Shingles   Sleep apnea   Tremor   Ulceration   Wears glasses    Reproductive/Obstetrics                             Anesthesia Physical  Anesthesia Plan  ASA: 3  Anesthesia Plan: General   Post-op Pain Management:    Induction: Intravenous  PONV Risk Score and Plan: 3 and TIVA and Propofol infusion  Airway Management Planned: Natural Airway and Nasal Cannula  Additional Equipment:   Intra-op Plan:   Post-operative Plan:   Informed Consent: I have reviewed the patients History and Physical, chart, labs and discussed the procedure including the risks, benefits and alternatives for the proposed anesthesia with the patient or authorized representative who has indicated his/her understanding and acceptance.       Plan Discussed with: CRNA, Anesthesiologist and Surgeon  Anesthesia Plan Comments:         Anesthesia Quick Evaluation

## 2021-08-02 NOTE — Interval H&P Note (Signed)
History and Physical Interval Note:  08/02/2021 8:27 AM  [name]  has presented today for surgery, with the diagnosis of Crohn's disease of small and large intestines with complication  P91.505.  The various methods of treatment have been discussed with the patient and family. After consideration of risks, benefits and other options for treatment, the patient has consented to  Procedure(s): COLONOSCOPY WITH PROPOFOL (N/A) as a surgical intervention.  The patient's history has been reviewed, patient examined, no change in status, stable for surgery.  I have reviewed the patient's chart and labs.  Questions were answered to the patient's satisfaction.     Nicole Bass  Ok to proceed with colonoscopy

## 2021-08-02 NOTE — Transfer of Care (Addendum)
Immediate Anesthesia Transfer of Care Note  Patient: Nicole Bass  Procedure(s) Performed: COLONOSCOPY WITH PROPOFOL  Patient Location: PACU  Anesthesia Type:General  Level of Consciousness: sedated  Airway & Oxygen Therapy: Patient Spontanous Breathing  Post-op Assessment: Report given to RN and Post -op Vital signs reviewed and stable  Post vital signs: Reviewed and stable  Last Vitals:  Vitals Value Taken Time  BP 118/64 08/02/21 0853  Temp 36.4 C 08/02/21 0853  Pulse 83 08/02/21 0854  Resp 14 08/02/21 0854  SpO2 97 % 08/02/21 0854    Last Pain:  Vitals:   08/02/21 0853  TempSrc: Tympanic  PainSc: Asleep         Complications: No notable events documented.

## 2021-08-02 NOTE — Op Note (Signed)
[facility]astroenterology Patient Name: Nicole Bass Procedure Date: 08/02/2021 8:18 AM MRN: [id number] Account #: [id number] Date of Birth: [birth year] Admit Type: Outpatient Age: 70 Room: [facility] ENDO ROOM 4 Gender: Female Note Status: Finalized Instrument Name: Nicole Bass [id number] Procedure:             Colonoscopy Indications:           Crohn's disease of the small bowel Providers:             Andrey Farmer MD, MD Medicines:             Monitored Anesthesia Care Complications:         No immediate complications. Estimated blood loss:                         Minimal. Procedure:             Pre-Anesthesia Assessment:                        - Prior to the procedure, a History and Physical was                         performed, and patient medications and allergies were                         reviewed. The patient is competent. The risks and                         benefits of the procedure and the sedation options and                         risks were discussed with the patient. All questions                         were answered and informed consent was obtained.                         Patient identification and proposed procedure were                         verified by the physician, the nurse, the                         anesthesiologist, the anesthetist and the technician                         in the pre-procedure area in the endoscopy suite.                         Mental Status Examination: alert and oriented. Airway                         Examination: normal oropharyngeal airway and neck                         mobility. Respiratory Examination: clear to                         auscultation. CV Examination: normal. Prophylactic  Antibiotics: The patient does not require prophylactic                         antibiotics. Prior Anticoagulants: The patient has                         taken no previous anticoagulant or  antiplatelet                         agents. ASA Grade Assessment: III - A patient with                         severe systemic disease. After reviewing the risks and                         benefits, the patient was deemed in satisfactory                         condition to undergo the procedure. The anesthesia                         plan was to use monitored anesthesia care (MAC).                         Immediately prior to administration of medications,                         the patient was re-assessed for adequacy to receive                         sedatives. The heart rate, respiratory rate, oxygen                         saturations, blood pressure, adequacy of pulmonary                         ventilation, and response to care were monitored                         throughout the procedure. The physical status of the                         patient was re-assessed after the procedure.                        After obtaining informed consent, the colonoscope was                         passed under direct vision. Throughout the procedure,                         the patient's blood pressure, pulse, and oxygen                         saturations were monitored continuously. The                         Colonoscope was introduced through the anus and  advanced to the the ileocolonic anastomosis. The                         colonoscopy was performed without difficulty. The                         patient tolerated the procedure well. The quality of                         the bowel preparation was good. Findings:      The perianal and digital rectal examinations were normal.      The neo-terminal ileum appeared normal. Biopsies were taken with a cold       forceps for histology. Estimated blood loss was minimal.      The colon (entire examined portion) appeared normal.      Internal hemorrhoids were found during retroflexion. The hemorrhoids       were Grade I  (internal hemorrhoids that do not prolapse). Impression:            - The examined portion of the ileum was normal.                         Biopsied.                        - The entire examined colon is normal.                        - Internal hemorrhoids. Recommendation:        - Repeat colonoscopy for surveillance based on                         pathology results.                        - Return to referring physician as previously                         scheduled.                        - Await pathology results.                        - Continue present medications. Procedure Code(s):     --- Professional ---                        [phone], Colonoscopy, flexible; with biopsy, single or                         multiple Diagnosis Code(s):     --- Professional ---                        K64.0, First degree hemorrhoids                        K50.00, Crohn's disease of small intestine without                         complications CPT copyright 2019 American Medical Association. All rights reserved. The  codes documented in this report are preliminary and upon coder review may  be revised to meet current compliance requirements. Andrey Farmer MD, MD 08/02/2021 8:53:05 AM Number of Addenda: 0 Note Initiated On: 08/02/2021 8:18 AM Scope Withdrawal Time: 0 hours 7 minutes 27 seconds  Total Procedure Duration: 0 hours 10 minutes 48 seconds  Estimated Blood Loss:  Estimated blood loss was minimal.      [facility]

## 2021-08-02 NOTE — Anesthesia Postprocedure Evaluation (Signed)
Anesthesia Post Note  Patient: Nicole Bass  Procedure(s) Performed: COLONOSCOPY WITH PROPOFOL  Patient location during evaluation: Phase II Anesthesia Type: General Level of consciousness: awake and alert, awake and oriented Pain management: pain level controlled Vital Signs Assessment: post-procedure vital signs reviewed and stable Respiratory status: spontaneous breathing, nonlabored ventilation and respiratory function stable Cardiovascular status: blood pressure returned to baseline and stable Postop Assessment: no apparent nausea or vomiting Anesthetic complications: no   No notable events documented.   Last Vitals:  Vitals:   08/02/21 0913 08/02/21 0923  BP: 128/67 122/64  Pulse: 82 81  Resp: 20 20  Temp:    SpO2: 99% 100%    Last Pain:  Vitals:   08/02/21 0923  TempSrc:   PainSc: 0-No pain                 Phill Mutter

## 2021-08-02 NOTE — H&P (Signed)
Outpatient short stay form Pre-procedure 08/02/2021  Nicole Rubenstein, MD  Primary Physician: [name], MD  Reason for visit:  Crohn's disease  History of present illness:   70 y/o lady with history of crohns with history of ileocectomy here for disease assessment due to taking xeljanz. No blood thinners. Grandfather had colon cancer. Last colonoscopy in 2018 with no evidence of active crohn's disease.    Current Facility-Administered Medications:    0.9 %  sodium chloride infusion, , Intravenous, Continuous, Daniya Aramburo, Hilton Cork, MD, Last Rate: 20 mL/hr at 08/02/21 0803, Continued from Pre-op at 08/02/21 0803  Medications Prior to Admission  Medication Sig Dispense Refill Last Dose   alendronate (FOSAMAX) 70 MG tablet Take 70 mg by mouth every Monday. Take with a full glass of water on an empty stomach.   Past Week   aspirin EC 81 MG tablet Take 81 mg by mouth daily. Swallow whole.   Past Week   betamethasone dipropionate 0.05 % lotion Apply topically 2 (two) times daily as needed (Rash). Apply thin coat to affected areas twice daily for 2 weeks 60 mL 0 Past Week   cetirizine (ZYRTEC) 10 MG tablet Take 10 mg by mouth daily.   08/01/2021 at 0700   chlorhexidine (PERIDEX) 0.12 % solution Use as directed 15 mLs in the mouth or throat as needed (mouth sores).   Past Week   cholecalciferol (VITAMIN D3) 25 MCG (1000 UT) tablet Take 1,000 Units by mouth daily.   Past Week   clindamycin (CLEOCIN T) 1 % external solution Apply to sores on scalp QD PRN. 60 mL 3 08/01/2021   cyanocobalamin (,VITAMIN B-12,) 1000 MCG/ML injection Inject 1,000 mcg into the muscle every 30 (thirty) days.   Past Week   diazepam (VALIUM) 10 MG tablet Take 1 tablet (10 mg total) by mouth daily as needed (vaginal pain with intercourse, use before sexual activity per vagina). (Patient taking differently: Take 10 mg by mouth as needed (vaginal pain with intercourse, use before sexual activity per vagina).) 30 tablet  0 Past Month   estradiol (ESTRACE VAGINAL) 0.1 MG/GM vaginal cream Use one applicator 3 times a week at night 42.5 g 12 Past Week   fluconazole (DIFLUCAN) 200 MG tablet Take 400 mg by mouth daily.   Past Week   gabapentin (NEURONTIN) 100 MG capsule Take 100 mg by mouth every morning.   08/02/2021 at 0600   gabapentin (NEURONTIN) 400 MG capsule Take 400 mg by mouth at bedtime.   08/02/2021 at 0600   ketoconazole (NIZORAL) 2 % shampoo Shampoo into the scalp let sit 5-10 minutes then wash out. Use twice per week. 120 mL 5 Past Week   lidocaine (XYLOCAINE) 2 % solution Use as directed 15 mLs in the mouth or throat as needed for mouth pain.   Past Week   magnesium oxide (MAG-OX) 400 MG tablet Take 400 mg by mouth every Monday, Wednesday, and Friday.   Past Week   Multiple Vitamin (MULTIVITAMIN WITH MINERALS) TABS tablet Take 1 tablet by mouth daily.   Past Week   omeprazole (PRILOSEC) 40 MG capsule Take 40 mg by mouth daily before breakfast.    08/02/2021 at 0600   predniSONE (DELTASONE) 1 MG tablet Take 3 mg by mouth daily with breakfast.    08/02/2021 at 0600   Probiotic Product (PROBIOTIC PO) Take 1 capsule by mouth daily.    Past Week   rOPINIRole (REQUIP) 1 MG tablet Take 1 mg by mouth at bedtime  as needed (for restless legs).    Past Week   sucralfate (CARAFATE) 1 g tablet Take 0.5 g by mouth 3 (three) times daily with meals as needed (stomach cramping/Crohn's symptoms).   Past Week   Tofacitinib Citrate (XELJANZ) 5 MG TABS Take 5 mg by mouth 2 (two) times daily.   08/02/2021 at 0600   clindamycin (CLEOCIN) 300 MG capsule Take 300 mg by mouth 3 (three) times daily.      HYDROcodone-acetaminophen (NORCO) 10-325 MG tablet Take 1 tablet by mouth every 6 (six) hours as needed for severe pain. (Patient not taking: Reported on 06/28/2021) 20 tablet 0      Allergies  Allergen Reactions   Ciprofloxacin Itching and Rash   Levofloxacin Hives and Rash   Methotrexate     Other reaction(s): Other (See  Comments) Mouth Ulcers/[place] Mouth and throat ulcers   Methotrexate Derivatives     Mouth and throat ulcers   Other Other (See Comments) and Rash    Other reaction(s): Other (See Comments), Unknown Per pts MD she is unable to take this medication because it is contraindicated with her other medications.   Per pts MD she is unable to take this medication because it is contraindicated with her other medications.   Other reaction(s): Other (See Comments), Unknown Other reaction(s): Other (See Comments), Unknown Per pts MD she is unable to take this medication because it is contraindicated with her other medications.   Per pts MD she is unable to take this medication because it is contraindicated with her other medications.   Other reaction(s): Other (See Comments), Unknown Other reaction(s): Other (See Comments), Unknown Per pts MD she is unable to take this medication because it is contraindicated with her  Per pts MD she is unable to take this medication because it is contraindicated with her other medications.   Other reaction(s): Other (See Comments) Other reaction(s): Other (See Comments), Unknown Per pts MD she is unable to take this medication because it is contraindicated with her other medications.   Per pts MD she is unable to take this medication because it is contraindicated with her other medications.   Other reaction(s): Other (See Comments), Unknown Other reaction(s): Other (See Comments), Unknown Per pts MD she is unable to take this medication because it is contraindicated with her other medications.   Per pts MD she is unable to take this medication because it is contraindicated with her other medications.   Other reaction(s): Other (See Comments), Unknown Other reaction(s): Other (See Comments), Unknown Per pts MD she is unable to take this medication because it is contraindicated with her  Per pts MD she is unable to take this medication because it is contraindicated with  her other medications.     Sulfa Antibiotics Other (See Comments)    Per pts MD she is unable to take this medication because it is contraindicated with her other medications.     Trelegy Ellipta [Fluticasone-Umeclidin-Vilant]     Laryngitis    Cefazolin Itching and Rash   Cefprozil Rash   Cephalosporins Itching and Rash   Doxycycline Rash   Enbrel [Etanercept] Itching and Rash   Humira [Adalimumab] Itching and Rash   Hydrocodone-Acetaminophen Itching and Other (See Comments)    Itching only   Lorabid [Loracarbef] Itching and Rash   Meropenem Itching and Rash   Oxycodone Itching, Other (See Comments) and Rash    Other reaction(s): Other (See Comments)   Oxycodone-Acetaminophen Itching   Primidone Itching and Rash  Past Medical History:  Diagnosis Date   Anemia    iron everyday, 3 iron infusions last one August 2020   Anxiety    nervous   Arthritis    rheumatoid   Collagen vascular disease ([place])    Crohn disease ([place])    Crohn's disease ([place])    Crohn's disease ([place])    DDD (degenerative disc disease), cervical    DDD (degenerative disc disease), cervical    DDD (degenerative disc disease), cervical 2003   DDD (degenerative disc disease), cervical    DDD (degenerative disc disease), lumbar    Degenerative disc disease, cervical    Depression    Diarrhea    Dyspnea    out of shape   Fatty liver    GERD (gastroesophageal reflux disease)    Hand weakness    left hand worse, small tremors   Headache    with accident   History of chicken pox    History of claustrophobia    History of kidney stones    History of shingles    HOH (hard of hearing)    left ear   Immune deficiency disorder (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    Physical Exam  Gen: Alert, oriented. Appears stated age.  HEENT: PERRLA. Lungs: No respiratory distress CV: RRR Abd: soft, benign, no masses Ext: No edema    Planned procedures: Proceed with colonoscopy. The patient understands the nature of the planned procedure, indications, risks, alternatives and potential complications including but not limited to bleeding, infection, perforation, damage to internal organs and possible oversedation/side effects from anesthesia. The patient agrees and gives consent to proceed.  Please refer to procedure notes for findings, recommendations and patient disposition/instructions.     Nicole Rubenstein, MD [facility] Gastroenterology

## 2021-08-03 ENCOUNTER — Encounter: Payer: Self-pay | Admitting: Gastroenterology

## 2021-08-03 LAB — SURGICAL PATHOLOGY

## 2021-08-03 NOTE — Progress Notes (Signed)
Post Call Follow-Up:  spoke with the patient on 08-03-2021 as follow-up after her Colonoscopy with Dr Haig Prophet on 08-02-2021. Patient states she feels 'drained' today and 'weak'.  Denies sore throat, cough, body aches, fever, nausea/vomiting, abdominal pain, body aches.  Encouraged patient to drink plenty of fluids, including gatorade if possible, and increase her diet as tolerated to include fruits, vegetables, and proteins.  Tolerating diet.  Will continue to monitor her symptoms and notify Dr Drinda Butts office or Primary Care Doctor if symptoms develop or worsen.  Patient agrees with the plan.

## 2021-08-27 ENCOUNTER — Other Ambulatory Visit (INDEPENDENT_AMBULATORY_CARE_PROVIDER_SITE_OTHER): Payer: Self-pay | Admitting: Nurse Practitioner

## 2021-08-27 MED ORDER — ALPRAZOLAM 0.5 MG PO TABS: ORAL_TABLET | ORAL | 0 refills | Status: DC

## 2021-08-30 ENCOUNTER — Telehealth (INDEPENDENT_AMBULATORY_CARE_PROVIDER_SITE_OTHER): Payer: Self-pay

## 2021-08-30 NOTE — Telephone Encounter (Signed)
The patient is having a laser procedure and it is for her procedure.

## 2021-08-30 NOTE — Telephone Encounter (Signed)
I called the pt and left a VM making her aware of her laser and that she should get a packet of info in the mail as well

## 2021-08-30 NOTE — Telephone Encounter (Signed)
Pt called an left a VM on the nurses line wanting to know why a Rx for Xanax has been called into her pharmacy. Please advise.

## 2021-09-15 ENCOUNTER — Telehealth: Payer: Self-pay | Admitting: Urology

## 2021-09-15 MED ORDER — ESTRADIOL 0.1 MG/GM VA CREA: TOPICAL_CREAM | VAGINAL | 12 refills | Status: DC

## 2021-09-15 NOTE — Telephone Encounter (Signed)
RX sent to pharmacy as requested

## 2021-09-15 NOTE — Telephone Encounter (Signed)
Patient called and stated that the Estradiol vaginal cream is working.  She is requesting a refill to be sent to [facility]

## 2021-09-21 ENCOUNTER — Other Ambulatory Visit: Payer: Self-pay | Admitting: Gastroenterology

## 2021-09-21 DIAGNOSIS — R1084 Generalized abdominal pain: Secondary | ICD-10-CM

## 2021-09-30 ENCOUNTER — Other Ambulatory Visit: Payer: Self-pay

## 2021-09-30 ENCOUNTER — Ambulatory Visit
Admission: RE | Admit: 2021-09-30 | Discharge: 2021-09-30 | Disposition: A | Discharge status: Home / Self Care | Payer: Medicare Other | Source: Ambulatory Visit | Attending: Gastroenterology | Admitting: Gastroenterology

## 2021-09-30 DIAGNOSIS — R1084 Generalized abdominal pain: Secondary | ICD-10-CM | POA: Insufficient documentation

## 2021-09-30 LAB — POCT I-STAT CREATININE: Creatinine, Ser: 1 mg/dL (ref 0.44–1.00)

## 2021-09-30 MED ORDER — IOHEXOL 300 MG/ML  SOLN
85.0000 mL | Freq: Once | INTRAMUSCULAR | Status: AC | PRN
  Administered 2021-09-30: 85 mL via INTRAVENOUS

## 2021-10-01 ENCOUNTER — Ambulatory Visit (INDEPENDENT_AMBULATORY_CARE_PROVIDER_SITE_OTHER): Payer: Medicare Other | Admitting: Vascular Surgery

## 2021-10-01 VITALS — BP 122/74 | HR 83 | Resp 16 | Ht 61.0 in | Wt 172.0 lb

## 2021-10-01 DIAGNOSIS — I83812 Varicose veins of left lower extremities with pain: Secondary | ICD-10-CM

## 2021-10-01 NOTE — Progress Notes (Signed)
Nicole Bass is a 71 y.o. female who presents with symptomatic venous reflux  Past Medical History:  Diagnosis Date   Anemia    iron everyday, 3 iron infusions last one August 2020   Anxiety    nervous   Arthritis    rheumatoid   Collagen vascular disease ([place])    Crohn disease ([place])    Crohn's disease ([place])    Crohn's disease ([place])    DDD (degenerative disc disease), cervical    DDD (degenerative disc disease), cervical    DDD (degenerative disc disease), cervical 2003   DDD (degenerative disc disease), cervical    DDD (degenerative disc disease), lumbar    Degenerative disc disease, cervical    Depression    Diarrhea    Dyspnea    out of shape   Fatty liver    GERD (gastroesophageal reflux disease)    Hand weakness    left hand worse, small tremors   Headache    with accident   History of chicken pox    History of claustrophobia    History of kidney stones    History of shingles    HOH (hard of hearing)    left ear   Immune deficiency disorder ([place])    Lymphatic disorder    Meniere's disease    Motion sickness    Nasal fracture 03/27/2020   due to fall   Neuromuscular disorder ([facility])    Neuropathy    legs   Peripheral neuropathy    Pyelonephritis    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[place])    UTI (urinary tract infection)    HO   Vertigo    can be accompanied by nausea   Wears dentures    upper dentures    Past Surgical History:  Procedure Laterality Date   ABDOMINAL HYSTERECTOMY     APPENDECTOMY     BACK SURGERY     x 3   CERVICAL SPINE SURGERY     metal plate   [place]   X 2   CHOLECYSTECTOMY     CLOSED REDUCTION NASAL FRACTURE N/A 04/09/2020   Procedure: CLOSED REDUCTION NASAL FRACTURE;  Surgeon: Margaretha Sheffield, MD;  Location: [facility];  Service: ENT;  Laterality: N/A;   [place]   removed 18 inches of colon,removed appendix   COLONOSCOPY     COLONOSCOPY WITH PROPOFOL N/A 08/02/2021   Procedure:  COLONOSCOPY WITH PROPOFOL;  Surgeon: Lesly Rubenstein, MD;  Location: [facility];  Service: Endoscopy;  Laterality: N/A;   dental implants     3 teeth/ wears dentures   DILATION AND CURETTAGE OF UTERUS  2004   ELBOW FRACTURE SURGERY Left 2008   FIBEROPTIC BRONCHOSCOPY N/A 06/01/2020   Procedure: BEDSIDE BRONCHOSCOPY FIBEROPTIC;  Surgeon: Ottie Glazier, MD;  Location: [facility];  Service: Thoracic;  Laterality: N/A;   FRACTURE SURGERY     JOINT REPLACEMENT Right 2015   partial knee replacement   KYPHOPLASTY     KYPHOPLASTY N/A 04/14/2015   Procedure: KYPHOPLASTY;  Surgeon: Hessie Knows, MD;  Location: [facility];  Service: Orthopedics;  Laterality: N/A;   KYPHOPLASTY N/A 06/04/2015   Procedure: KYPHOPLASTY L-5;  Surgeon: Hessie Knows, MD;  Location: [facility];  Service: Orthopedics;  Laterality: N/A;   KYPHOPLASTY     LUMBAR LAMINECTOMY FOR EPIDURAL ABSCESS N/A 10/12/2020   Procedure: SYNOVIAL CYST RESECTION;  Surgeon: Deetta Perla, MD;  Location: [facility];  Service: Neurosurgery;  Laterality: N/A;  MAXIMUM ACCESS (MAS) TRANSFORAMINAL LUMBAR INTERBODY FUSION (TLIF) 1 LEVEL Left 10/12/2020   Procedure: OPEN L5/S1 TRANSFORAMINAL LUMBAR INTERBODY FUSION (TLIF) 1 LEVEL;  Surgeon: Deetta Perla, MD;  Location: [facility];  Service: Neurosurgery;  Laterality: Left;   MEDIAL PARTIAL KNEE REPLACEMENT Right 2015   NECK/PLATE  7829   OOPHORECTOMY Bilateral    ORIF NASAL FRACTURE N/A 04/09/2020   Procedure: OPEN REDUCTION INTERNAL FIXATION (ORIF) NASAL FRACTURE;  Surgeon: Margaretha Sheffield, MD;  Location: [facility];  Service: ENT;  Laterality: N/A;   REPAIR EXTENSOR TENDON Left 01/08/2019   Procedure: Realignment  EXTENSOR TENDON;  Surgeon: Hessie Knows, MD;  Location: [facility];  Service: Orthopedics;  Laterality: Left;   REVERSE SHOULDER ARTHROPLASTY Right 04/18/2019   Procedure: REVERSE SHOULDER ARTHROPLASTY;  Surgeon: Corky Mull, MD;  Location: [facility];  Service: Orthopedics;  Laterality:  Right;   SHOULDER ARTHROSCOPY WITH ROTATOR CUFF REPAIR AND SUBACROMIAL DECOMPRESSION Right 04/13/2016   Procedure: SHOULDER ARTHROSCOPY WITH ROTATOR CUFF REPAIR AND SUBACROMIAL DECOMPRESSION, release of long head biceps tendon;  Surgeon: Leanor Kail, MD;  Location: [facility];  Service: Orthopedics;  Laterality: Right;   TOE SURGERY Right 2013   TONSILLECTOMY  1995   UPPER GI ENDOSCOPY     WRIST FRACTURE SURGERY Bilateral      Current Outpatient Medications:    alendronate (FOSAMAX) 70 MG tablet, Take 70 mg by mouth every Monday. Take with a full glass of water on an empty stomach., Disp: , Rfl:    ALPRAZolam (XANAX) 0.5 MG tablet, Take one tab 1 hour before scheduled procedure and one tab when you arrive in office, Disp: 2 tablet, Rfl: 0   aspirin EC 81 MG tablet, Take 81 mg by mouth daily. Swallow whole., Disp: , Rfl:    betamethasone dipropionate 0.05 % lotion, Apply topically 2 (two) times daily as needed (Rash). Apply thin coat to affected areas twice daily for 2 weeks, Disp: 60 mL, Rfl: 0   cetirizine (ZYRTEC) 10 MG tablet, Take 10 mg by mouth daily., Disp: , Rfl:    chlorhexidine (PERIDEX) 0.12 % solution, Use as directed 15 mLs in the mouth or throat as needed (mouth sores)., Disp: , Rfl:    cholecalciferol (VITAMIN D3) 25 MCG (1000 UT) tablet, Take 1,000 Units by mouth daily., Disp: , Rfl:    clindamycin (CLEOCIN T) 1 % external solution, Apply to sores on scalp QD PRN., Disp: 60 mL, Rfl: 3   clindamycin (CLEOCIN) 300 MG capsule, Take 300 mg by mouth 3 (three) times daily., Disp: , Rfl:    cyanocobalamin (,VITAMIN B-12,) 1000 MCG/ML injection, Inject 1,000 mcg into the muscle every 30 (thirty) days., Disp: , Rfl:    diazepam (VALIUM) 10 MG tablet, Take 1 tablet (10 mg total) by mouth daily as needed (vaginal pain with intercourse, use before sexual activity per vagina). (Patient taking differently: Take 10 mg by mouth as needed (vaginal pain with intercourse, use before sexual activity  per vagina).), Disp: 30 tablet, Rfl: 0   estradiol (ESTRACE VAGINAL) 0.1 MG/GM vaginal cream, Apply a pea size amount 3 nights a week, Disp: 42.5 g, Rfl: 12   fluconazole (DIFLUCAN) 200 MG tablet, Take 400 mg by mouth daily., Disp: , Rfl:    gabapentin (NEURONTIN) 100 MG capsule, Take 100 mg by mouth every morning., Disp: , Rfl:    gabapentin (NEURONTIN) 400 MG capsule, Take 400 mg by mouth at bedtime., Disp: , Rfl:    HYDROcodone-acetaminophen (NORCO) 10-325 MG tablet, Take 1  tablet by mouth every 6 (six) hours as needed for severe pain. (Patient not taking: Reported on 06/28/2021), Disp: 20 tablet, Rfl: 0   ketoconazole (NIZORAL) 2 % shampoo, Shampoo into the scalp let sit 5-10 minutes then wash out. Use twice per week., Disp: 120 mL, Rfl: 5   lidocaine (XYLOCAINE) 2 % solution, Use as directed 15 mLs in the mouth or throat as needed for mouth pain., Disp: , Rfl:    magnesium oxide (MAG-OX) 400 MG tablet, Take 400 mg by mouth every Monday, Wednesday, and Friday., Disp: , Rfl:    Multiple Vitamin (MULTIVITAMIN WITH MINERALS) TABS tablet, Take 1 tablet by mouth daily., Disp: , Rfl:    omeprazole (PRILOSEC) 40 MG capsule, Take 40 mg by mouth daily before breakfast. , Disp: , Rfl:    predniSONE (DELTASONE) 1 MG tablet, Take 3 mg by mouth daily with breakfast. , Disp: , Rfl:    Probiotic Product (PROBIOTIC PO), Take 1 capsule by mouth daily. , Disp: , Rfl:    rOPINIRole (REQUIP) 1 MG tablet, Take 1 mg by mouth at bedtime as needed (for restless legs). , Disp: , Rfl:    sucralfate (CARAFATE) 1 g tablet, Take 0.5 g by mouth 3 (three) times daily with meals as needed (stomach cramping/Crohn's symptoms)., Disp: , Rfl:    Tofacitinib Citrate (XELJANZ) 5 MG TABS, Take 5 mg by mouth 2 (two) times daily., Disp: , Rfl:   Allergies  Allergen Reactions   Ciprofloxacin Itching and Rash   Levofloxacin Hives and Rash   Methotrexate     Other reaction(s): Other (See Comments) Mouth Ulcers/[place] Mouth and  throat ulcers   Methotrexate Derivatives     Mouth and throat ulcers   Other Other (See Comments) and Rash    Other reaction(s): Other (See Comments), Unknown Per pts MD she is unable to take this medication because it is contraindicated with her other medications.   Per pts MD she is unable to take this medication because it is contraindicated with her other medications.   Other reaction(s): Other (See Comments), Unknown Other reaction(s): Other (See Comments), Unknown Per pts MD she is unable to take this medication because it is contraindicated with her other medications.   Per pts MD she is unable to take this medication because it is contraindicated with her other medications.   Other reaction(s): Other (See Comments), Unknown Other reaction(s): Other (See Comments), Unknown Per pts MD she is unable to take this medication because it is contraindicated with her  Per pts MD she is unable to take this medication because it is contraindicated with her other medications.   Other reaction(s): Other (See Comments) Other reaction(s): Other (See Comments), Unknown Per pts MD she is unable to take this medication because it is contraindicated with her other medications.   Per pts MD she is unable to take this medication because it is contraindicated with her other medications.   Other reaction(s): Other (See Comments), Unknown Other reaction(s): Other (See Comments), Unknown Per pts MD she is unable to take this medication because it is contraindicated with her other medications.   Per pts MD she is unable to take this medication because it is contraindicated with her other medications.   Other reaction(s): Other (See Comments), Unknown Other reaction(s): Other (See Comments), Unknown Per pts MD she is unable to take this medication because it is contraindicated with her  Per pts MD she is unable to take this medication because it is contraindicated with her  other medications.     Sulfa  Antibiotics Other (See Comments)    Per pts MD she is unable to take this medication because it is contraindicated with her other medications.     Trelegy Ellipta [Fluticasone-Umeclidin-Vilant]     Laryngitis    Cefazolin Itching and Rash   Cefprozil Rash   Cephalosporins Itching and Rash   Doxycycline Rash   Enbrel [Etanercept] Itching and Rash   Humira [Adalimumab] Itching and Rash   Hydrocodone-Acetaminophen Itching and Other (See Comments)    Itching only   Lorabid [Loracarbef] Itching and Rash   Meropenem Itching and Rash   Oxycodone Itching, Other (See Comments) and Rash    Other reaction(s): Other (See Comments)   Oxycodone-Acetaminophen Itching   Primidone Itching and Rash     Varicose veins of leg with pain, left     PLAN: The patient's left lower extremity was sterilely prepped and draped. The ultrasound machine was used to visualize the saphenous vein throughout its course. A segment in the knee area was selected for access. The saphenous vein was accessed without difficulty using ultrasound guidance with a micropuncture needle. A 0.018 wire was then placed beyond the saphenofemoral junction and the needle was removed. The 65 cm sheath was then placed over the wire and the wire and dilator were removed. The laser fiber was then placed through the sheath and its tip was placed approximately 4-5 centimeters below the saphenofemoral junction. Tumescent anesthesia was then created with a dilute lidocaine solution. Laser energy was then delivered with constant withdrawal of the sheath and laser fiber. Approximately 1221 joules of energy were delivered over a length of 28 centimeters using a 1470 Hz VenaCure machine at 7 W. Sterile dressings were placed. The patient tolerated the procedure well without obvious complications.   Follow-up in 1 week with post-laser duplex.

## 2021-10-08 ENCOUNTER — Ambulatory Visit (INDEPENDENT_AMBULATORY_CARE_PROVIDER_SITE_OTHER): Payer: Medicare Other

## 2021-10-08 ENCOUNTER — Other Ambulatory Visit: Payer: Self-pay

## 2021-10-08 DIAGNOSIS — I83812 Varicose veins of left lower extremities with pain: Secondary | ICD-10-CM | POA: Diagnosis not present

## 2021-10-11 ENCOUNTER — Other Ambulatory Visit: Payer: Self-pay

## 2021-10-11 ENCOUNTER — Ambulatory Visit: Payer: Medicare Other | Admitting: Dermatology

## 2021-10-11 DIAGNOSIS — L820 Inflamed seborrheic keratosis: Secondary | ICD-10-CM | POA: Diagnosis not present

## 2021-10-11 DIAGNOSIS — L739 Follicular disorder, unspecified: Secondary | ICD-10-CM | POA: Diagnosis not present

## 2021-10-11 DIAGNOSIS — L719 Rosacea, unspecified: Secondary | ICD-10-CM | POA: Diagnosis not present

## 2021-10-11 DIAGNOSIS — L821 Other seborrheic keratosis: Secondary | ICD-10-CM | POA: Diagnosis not present

## 2021-10-11 MED ORDER — KETOCONAZOLE 2 % EX SHAM: MEDICATED_SHAMPOO | CUTANEOUS | 5 refills | Status: DC

## 2021-10-11 NOTE — Patient Instructions (Signed)

## 2021-10-11 NOTE — Progress Notes (Signed)
° °  Follow-Up Visit   Subjective  Nicole Bass is a 71 y.o. female who presents for the following: Folliculitis/rosacea (Of the scalp - improved since the last visit. Did use Clindamycin solution but has been clear and hasn't needed to use it, and Ketoconazole 2% shampoo. She would like refills on the Ketoconazole 2% shampoo. ) and Rash (On the face - has been happening since December, small papules scattered on the face. ).  The following portions of the chart were reviewed this encounter and updated as appropriate:   Tobacco   Allergies   Meds   Problems   Med Hx   Surg Hx   Fam Hx      Review of Systems:  No other skin or systemic complaints except as noted in HPI or Assessment and Plan.  Objective  Well appearing patient in no apparent distress; mood and affect are within normal limits.  A focused examination was performed including the scalp and face. Relevant physical exam findings are noted in the Assessment and Plan.  R temple near the zygoma x 1, L cheek x 2 (3) Erythematous stuck-on, waxy papule or plaque   Assessment & Plan  Folliculitis With rosacea - improved Scalp  Continue Ketoconazole 2% shampoo 2d/wk and  Clindamycin solution to aa's QD PRN.   Rosacea is a chronic progressive skin condition usually affecting the face of adults, causing redness and/or acne bumps. It is treatable but not curable. It sometimes affects the eyes (ocular rosacea) as well. It may respond to topical and/or systemic medication and can flare with stress, sun exposure, alcohol, exercise and some foods.  Daily application of broad spectrum spf 30+ sunscreen to face is recommended to reduce flares.  Related Medications clindamycin (CLEOCIN T) 1 % external solution Apply to sores on scalp QD PRN.  ketoconazole (NIZORAL) 2 % shampoo Shampoo into the scalp let sit 5-10 minutes then wash out. Use twice per week.  Inflamed seborrheic keratosis (3) R temple near the zygoma x 1, L cheek x  2  Multiple ISK's on the face - patient has never had treatment before and would like to treat just a few today to see how it feels and heals.  Likely irritated by CPAP straps.  Destruction of lesion - R temple near the zygoma x 1, L cheek x 2 Complexity: simple   Destruction method: cryotherapy   Informed consent: discussed and consent obtained   Timeout:  patient name, date of birth, surgical site, and procedure verified Lesion destroyed using liquid nitrogen: Yes   Region frozen until ice ball extended beyond lesion: Yes   Outcome: patient tolerated procedure well with no complications   Post-procedure details: wound care instructions given    Seborrheic Keratoses - face - Stuck-on, waxy, tan-brown papules and/or plaques  - Benign-appearing - Discussed benign etiology and prognosis. - Observe - Call for any changes  Return in about 6 weeks (around 11/22/2021) for ISK follow up and treatment.  Luther Redo, CMA, am acting as scribe for Sarina Ser, MD . Documentation: I have reviewed the above documentation for accuracy and completeness, and I agree with the above.  Sarina Ser, MD

## 2021-10-12 ENCOUNTER — Encounter: Payer: Self-pay | Admitting: Dermatology

## 2021-10-19 ENCOUNTER — Other Ambulatory Visit: Payer: Self-pay

## 2021-10-19 ENCOUNTER — Ambulatory Visit
Admission: RE | Admit: 2021-10-19 | Discharge: 2021-10-19 | Disposition: A | Discharge status: Home / Self Care | Payer: Medicare Other | Source: Ambulatory Visit | Attending: Infectious Diseases | Admitting: Infectious Diseases

## 2021-10-19 DIAGNOSIS — K50819 Crohn's disease of both small and large intestine with unspecified complications: Secondary | ICD-10-CM | POA: Insufficient documentation

## 2021-10-19 DIAGNOSIS — B450 Pulmonary cryptococcosis: Secondary | ICD-10-CM | POA: Diagnosis not present

## 2021-10-28 ENCOUNTER — Other Ambulatory Visit: Payer: Self-pay | Admitting: Neurology

## 2021-10-28 ENCOUNTER — Telehealth (INDEPENDENT_AMBULATORY_CARE_PROVIDER_SITE_OTHER): Payer: Self-pay | Admitting: Vascular Surgery

## 2021-10-28 NOTE — Telephone Encounter (Signed)
Called pt. And LVM to confirm if she wanted to see FB because JD would not be in tomorrow.   ?

## 2021-10-29 ENCOUNTER — Ambulatory Visit (INDEPENDENT_AMBULATORY_CARE_PROVIDER_SITE_OTHER): Payer: Medicare Other | Admitting: Vascular Surgery

## 2021-10-29 IMAGING — CT CT MAXILLOFACIAL W/O CM
3 series · 14 of 47 positions shown, 16 images · non-contrast
Comparison: CT cervical spine 02/03/2020, CT head 12/16/2018

CLINICAL DATA: Fall, facial injury, facial laceration

EXAM:
CT HEAD WITHOUT CONTRAST
CT MAXILLOFACIAL WITHOUT CONTRAST
CT CERVICAL SPINE WITHOUT CONTRAST
TECHNIQUE: Multidetector CT imaging of the head, cervical spine, and
maxillofacial structures were performed using the standard protocol
without intravenous contrast. Multiplanar CT image reconstructions
of the cervical spine and maxillofacial structures were also
generated.

[Series 3: max soft · axial · 0.36mm/px · z∈[-239,-93]mm · 8 of 85 slices shown, 10 images]
[im 6/85  brain]
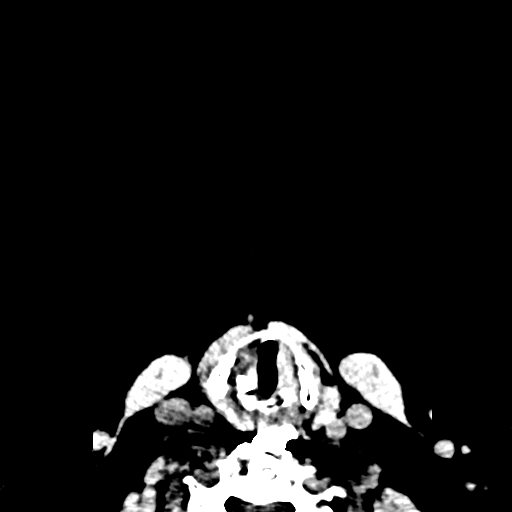
[im 6/85  bone]
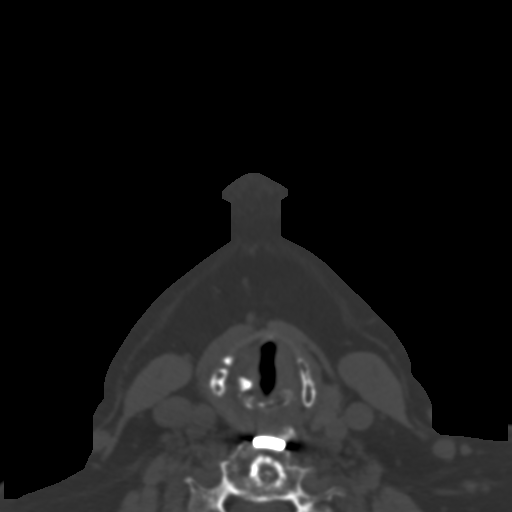
[im 18/85  bone]
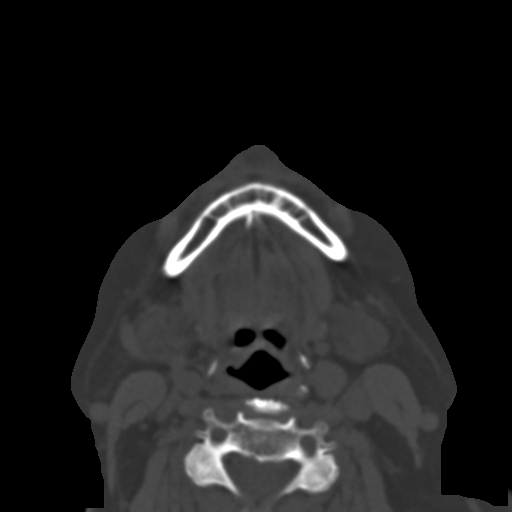
[im 27/85  bone]
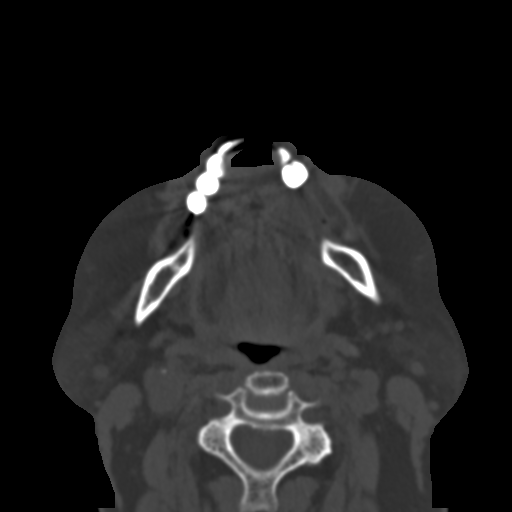
[im 38/85  bone]
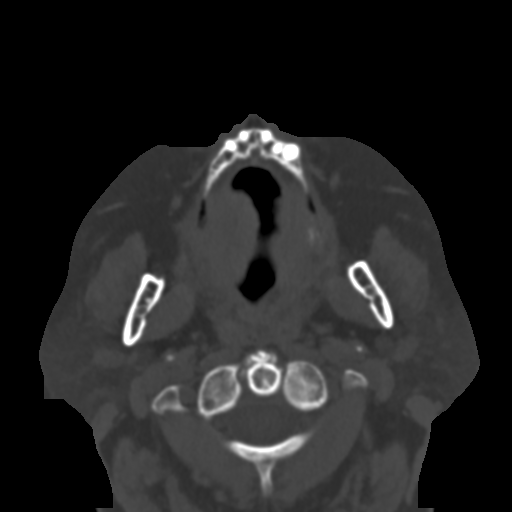
[im 47/85  brain]
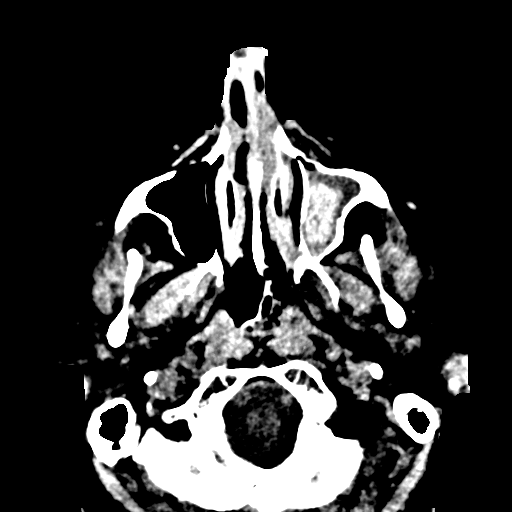
[im 47/85  bone]
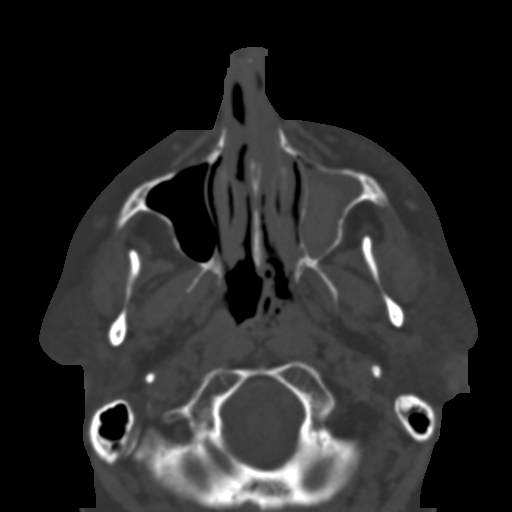
[im 58/85  bone]
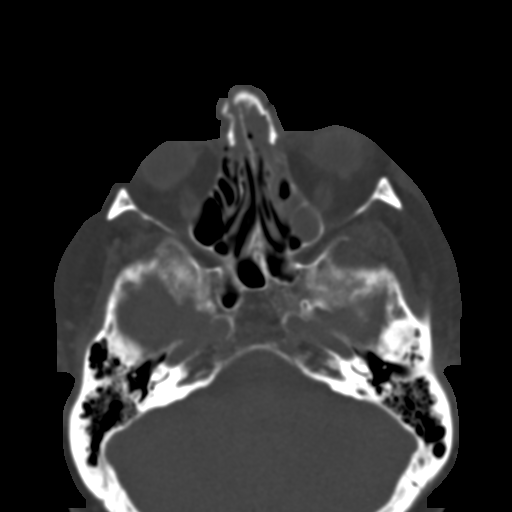
[im 67/85  bone]
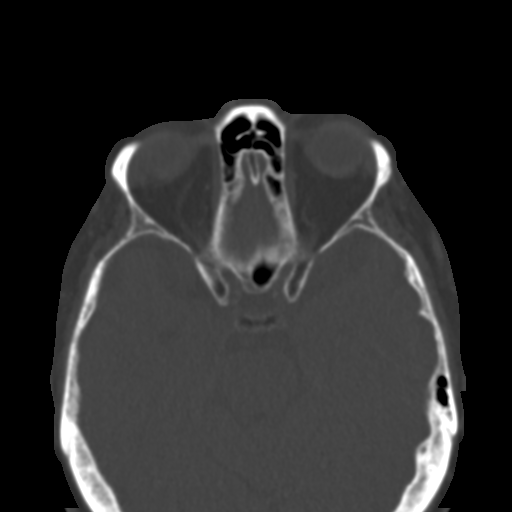
[im 79/85  bone]
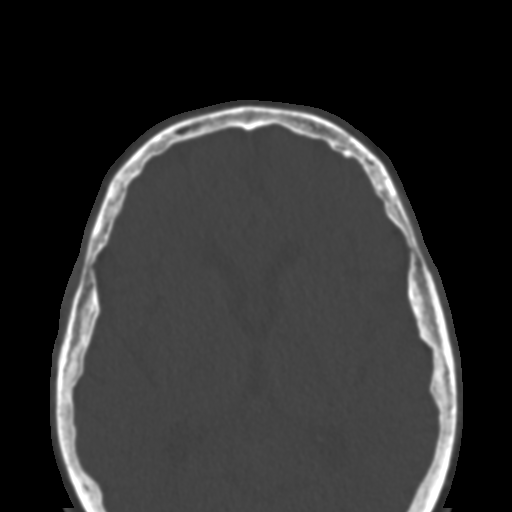

[Series 6: coronal soft · coronal · 0.37mm/px · 3 of 106 slices shown]
[im 36/106  bone]
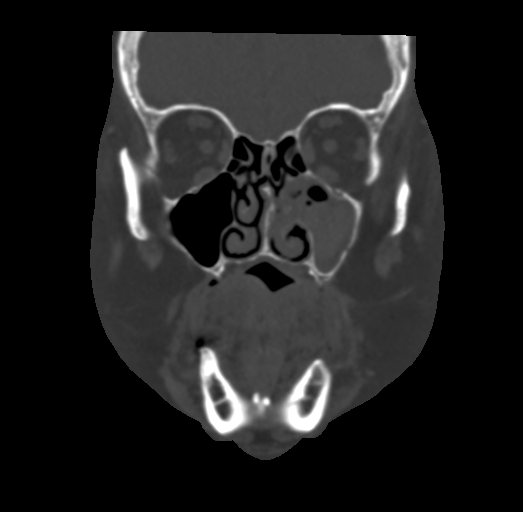
[im 47/106  bone]
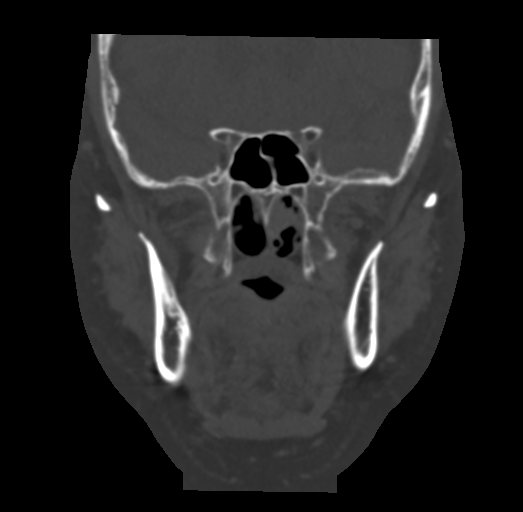
[im 59/106  bone]
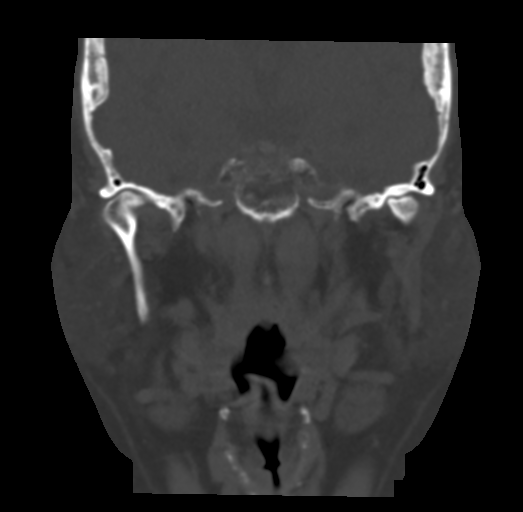

[Series 7: sagittal soft · sagittal · 0.38mm/px · 3 of 87 slices shown]
[im 29/87  bone]
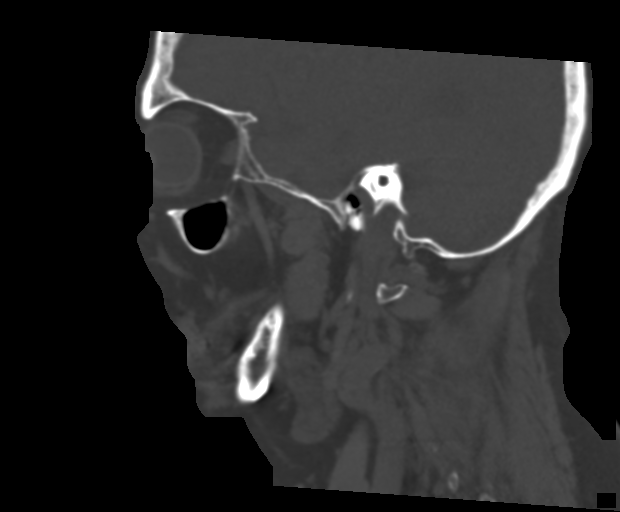
[im 44/87  bone]
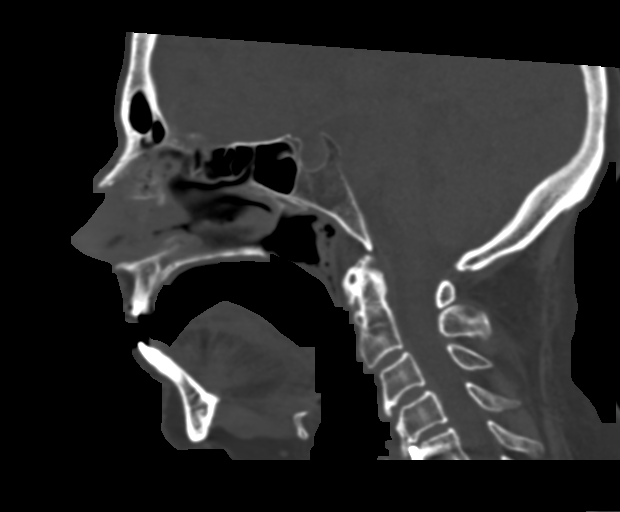
[im 58/87  bone]
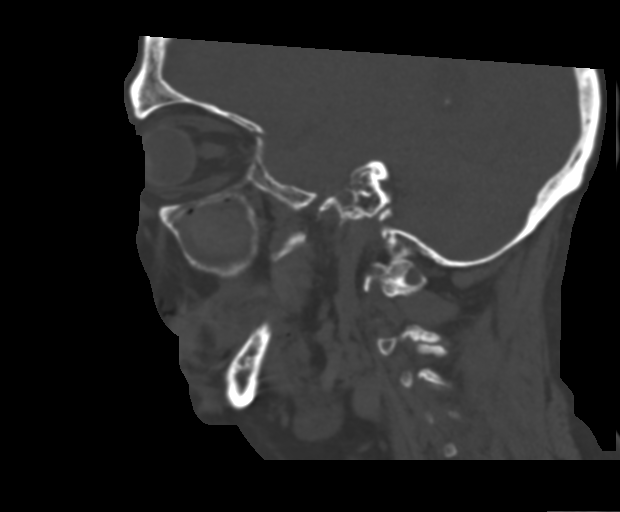

[14 of 47 positions shown; findings below may reference images not displayed]

FINDINGS: CT HEAD FINDINGS

Brain: Cavum septum pellucidum. Mild parenchymal volume loss is
commensurate with the patient's age. Tiny remote lacunar infarct
noted within the right putamen. No evidence of acute intracranial
hemorrhage or infarct. No abnormal mass effect or midline shift. No
abnormal intra or extra-axial mass lesion or fluid collection.
Ventricular size is normal. Cerebellum is unremarkable.

Vascular: Unremarkable.

Skull: Calvarium is intact.

Other: Mastoid air cells and middle ear cavities are clear.

CT MAXILLOFACIAL FINDINGS

Osseous: There is a mildly comminuted displaced fracture of the
nasal bones bilaterally as well as the nasal septum anteriorly with
minimal right lateral displacement. No other acute facial fracture
identified. Possible minimally displaced remote left inferior
orbital wall fracture.

Orbits: There is moderate left preseptal and infraorbital soft
tissue swelling. The orbital contents are unremarkable bilaterally,
however. The ocular globes are intact in the ocular lenses are in
appropriate position. The retro-orbital fat is preserved.

Sinuses: There is near complete opacification of the left maxillary
sinus with gas and high density heterogeneous fluid likely
representing blood products. Remaining paranasal sinuses are clear.
High density fluid tracks into the posterior nasopharynx.

Soft tissues: There is moderate soft tissue swelling superficial to
the nasal fracture. Preseptal and left infraorbital soft tissue
swelling previously noted. Facial soft tissues are otherwise
unremarkable.

CT CERVICAL SPINE FINDINGS

Alignment: There is mild reversal of the normal cervical lordosis,
unchanged from prior examination. No listhesis.

Skull base and vertebrae: The craniocervical junction is
unremarkable. The atlantodental interval is normal. Anterior
cervical discectomy and fusion of C5-C7 with an anterior plate and
multiple vertebral body screws is identified. There is solid
incorporation of interbody bone graft. Remaining vertebral body
height has been preserved. No acute fracture of the cervical spine.
No lytic or blastic bone lesion.

Soft tissues and spinal canal: No prevertebral fluid or swelling. No
visible canal hematoma.

Disc levels: Review of the sagittal images demonstrates severe
intervertebral disc space narrowing and endplate remodeling at C7-T1
in keeping it changes of severe degenerative disc disease. Moderate
degenerative changes are noted at T1-2. Milder degenerative changes
are noted at C3-4 and C4-5 with endplate remodeling noted. Axial
images demonstrate:

C1-2: Moderate degenerative arthritis involving the atlantodental
articulation. No canal stenosis.

C2-3: Moderate left facet arthrosis. Moderate left uncovertebral
arthrosis. The combination results in moderate left neural foraminal
narrowing. Moderate posterior disc bulge. No canal stenosis.

C3-4: Moderate left uncovertebral arthrosis. Mild right
uncovertebral arthrosis. Mild bilateral facet arthrosis. Moderate
central and left paracentral disc bulge. The combination results in
moderate left neural foraminal narrowing. Mild central canal
stenosis.

C4-5: Mild uncovertebral spurring and minimal facet arthrosis. No
significant neural foraminal narrowing. Mild broad-based disc bulge
noted. No significant canal stenosis.

C5-6: No significant canal stenosis. No significant neural foraminal
narrowing.

C6-7: No significant canal stenosis. No significant neural foraminal
narrowing.

C7-T1: Marked bilateral uncovertebral arthrosis. Minimal facet
arthrosis. Resultant severe left neural foraminal narrowing.
Moderate right neural foraminal narrowing. No significant canal
stenosis.

Upper chest: Unremarkable

Other: None significant
IMPRESSION: CT HEAD:

No acute intracranial process.

CT MAXILLOFACIAL:

1. Mildly comminuted displaced fracture of the nasal bones
bilaterally as well as the nasal septum anteriorly with minimal
right lateral displacement.
2. Possible minimally displaced remote left inferior orbital wall
fracture.
3. Moderate left preseptal and infraorbital soft tissue swelling.
4. Near complete opacification of the left maxillary sinus with
blood products.

CT CERVICAL SPINE:

1. No acute fracture or traumatic listhesis of the cervical spine.
2. Anterior cervical discectomy and fusion of C5-C7.
3. Multilevel degenerative changes as described.

## 2021-11-09 ENCOUNTER — Encounter (INDEPENDENT_AMBULATORY_CARE_PROVIDER_SITE_OTHER): Payer: Self-pay | Admitting: Vascular Surgery

## 2021-11-09 ENCOUNTER — Other Ambulatory Visit: Payer: Self-pay

## 2021-11-09 ENCOUNTER — Ambulatory Visit (INDEPENDENT_AMBULATORY_CARE_PROVIDER_SITE_OTHER): Payer: Medicare Other | Admitting: Vascular Surgery

## 2021-11-09 VITALS — BP 159/106 | HR 102 | Resp 16 | Ht 62.0 in | Wt 171.6 lb

## 2021-11-09 DIAGNOSIS — E1140 Type 2 diabetes mellitus with diabetic neuropathy, unspecified: Secondary | ICD-10-CM

## 2021-11-09 DIAGNOSIS — I10 Essential (primary) hypertension: Secondary | ICD-10-CM

## 2021-11-09 DIAGNOSIS — I83812 Varicose veins of left lower extremities with pain: Secondary | ICD-10-CM

## 2021-11-09 NOTE — Assessment & Plan Note (Signed)
Recommend: ? 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 ? ?Patient should undergo injection of saline sclerotherapy to treat the residual varicosities. ? ?The risks, benefits and alternative therapies were reviewed in detail with the patient.  All questions were answered.  The patient agrees to proceed with sclerotherapy at their convenience. ? ?The patient will continue wearing the graduated compression stockings and using the over-the-counter pain medications to treat her symptoms.  ? ?  ? ?

## 2021-11-09 NOTE — Patient Instructions (Signed)
Sclerotherapy ?Sclerotherapy is a procedure that is done to improve the appearance of varicose veins and spider veins. It also helps to relieve aching, swelling, cramping, and pain in the legs. Varicose veins are veins that have become enlarged, bulging, and twisted due to a damaged valve that causes blood to collect (pool) in the veins. Spider veins are small varicose veins. Sclerotherapy is usually performed on the legs because that is where varicose and spider veins most often occur. ?Sclerotherapy usually works best for smaller spider and varicose veins. This procedure involves injecting a chemical directly into the lining of the vein, causing it to swell and stick together. Over time, the vessel turns into scar tissue that fades from view. You may need more than one treatment to close a vein all the way. The number of veins injected in one session depends on the size and location of the veins, and on your overall medical condition. ?Tell a health care provider about: ?Any allergies you have. ?All medicines you are taking, including vitamins, herbs, eye drops, creams, and over-the-counter medicines. ?Any bleeding problems you have. ?Any surgeries you have had. ?Any medical conditions you have. ?Whether you are pregnant or may be pregnant. ?What are the risks? ?Generally, this is a safe procedure. However, problems may occur, including: ?Infection. ?Bleeding or blood clots. ?Allergic reactions to medicines or to the chemicals injected, which are called sclerosing agents. ?Larger injected veins becoming lumpy or hard. This may last for several months before getting better. ?Formation of small sores (ulcers) at the injection site. ?Red streaking in the groin area or bruising around the injection sites. ?[employer] or spots at the injection sites. These usually disappear within 3 to 6 months, but in rare cases they can be permanent. ?What happens before the procedure? ?Medicines ?Ask your health care provider  about: ?Changing or stopping your regular medicines. This is especially important if you are taking diabetes medicines or blood thinners. ?Taking medicines such as aspirin and ibuprofen. These medicines can thin your blood. Do not take these medicines unless your health care provider tells you to take them. ?Taking over-the-counter medicines, vitamins, herbs, and supplements. ?Tests ?You may have an ultrasound of the affected area to check for blood clots and to check blood flow. ?In rare cases, you may have an X-ray procedure to check how blood flows through your veins (angiogram). For an angiogram, a dye is injected to outline your veins on X-rays. ?General instructions ?Do not use lotions or creams on your legs before the procedure unless your health care provider approves. ?Follow instructions from your health care provider about eating and drinking restrictions. ?Do not use any products that contain nicotine or tobacco before the procedure. These products include cigarettes, chewing tobacco, and vaping devices, such as e-cigarettes. If you need help quitting, ask your health care provider. ?Ask your health care provider what steps will be taken to help prevent infection. These steps may include: ?Removing hair at the injection sites. ?Washing skin with a germ-killing soap. ?What happens during the procedure? ? ?The treatment area will be cleansed. ?A small, thin needle will be used to inject a chemical (sclerosant) into your varicose or spider veins. The sclerosant will irritate the lining of the vein and cause the vein to close below the injection site. You may feel some stinging, burning, or irritation. ?The injection may be repeated for more than one varicose or spider vein. ?After the procedure, the injection area will be wrapped with elastic bandages. ?The procedure  may vary among health care providers and hospitals. ?What can I expect after the procedure? ?After the procedure, it is common to  have: ?Swelling. ?Bruising and soreness. ?Mild skin discoloration. ?Slight bleeding from an injection site. ?Your injection area will be wrapped with elastic bandages. If there is bleeding, the bandages may be changed. ?After the treatment, you will be able to drive yourself home. ?You will need to wear compression stockings for about a week, or as long as your health care provider recommends. ?Follow these instructions at home: ?Injection area care ?Do not take baths, swim, or use a hot tub until your health care provider approves. Ask your health care provider if you may take showers. ?Wash the injection sites with a mild soap and lukewarm water. ?Do not apply hot compresses or any form of heat to the injected areas. ?Avoid direct exposure to sunlight, including suntanning and tanning beds. ?Activity ?Do light exercise every day, as told by your health care provider. Walking or riding a stationary bike may be good options for you. ?Return to your normal activities as told by your health care provider. Ask your health care provider what activities are safe for you. ?General instructions ?Take over-the-counter and prescription medicines only as told by your health care provider. ?Do not use any products that contain nicotine or tobacco. These products include cigarettes, chewing tobacco, and vaping devices, such as e-cigarettes. These can delay healing after the procedure. If you need help quitting, ask your health care provider. ?Do not use lotions or creams on your legs unless your health care provider approves. ?Wear compression stockings as told by your health care provider. These stockings help to prevent blood clots and reduce swelling in your legs. ?Wear loose-fitting clothing on the treatment area. ?Keep all follow-up visits. This is important. ?Contact a health care provider if: ?You have more redness, swelling, or pain around an injection site. ?You have more fluid or blood coming from an injection  site. ?An injection area feels warm to the touch. ?You have pus or a bad smell coming from an injection site. ?You have a fever. ?Get help right away if: ?You have leg pain that gets worse when you walk. ?You have redness or swelling in your leg that is getting worse. ?You have trouble breathing. ?You cough up blood. ?You have severe pain. ?Summary ?Sclerotherapy is a procedure that is done to improve the appearance of varicose veins and spider veins and to help relieve aching, swelling, cramping, and pain in the legs. ?A small, thin needle is used to inject a chemical (sclerosant) into a spider vein or varicose vein to close it off. ?Elastic bandages will be wrapped around the injection area after the procedure. ?Wear compression stockings as told by your health care provider. These stockings help to prevent blood clots and reduce swelling in your legs. ?This information is not intended to replace advice given to you by your health care provider. Make sure you discuss any questions you have with your health care provider. ?Document Revised: 01/07/2021 Document Reviewed: 01/07/2021 ?Elsevier Patient Education ? [place]. ? ?

## 2021-11-09 NOTE — Progress Notes (Signed)
? ? ?MRN : [id number] ? ?Nicole Bass is a 71 y.o. (Dec 17, 1950) female who presents with chief complaint of No chief complaint on file. ?. ? ?History of Present Illness: Patient returns today in follow up of her venous insufficiency.  She is about a month status post laser ablation of the left great saphenous vein.  She is doing well.  She has noticed significant improvement in pain and swelling in that left leg after laser ablation.  Duplex showed a successful ablation without DVT.  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 ? ?Current Outpatient Medications  ?Medication Sig Dispense Refill  ? alendronate (FOSAMAX) 70 MG tablet Take 70 mg by mouth every Monday. Take with a full glass of water on an empty stomach.    ? ALPRAZolam (XANAX) 0.5 MG tablet Take one tab 1 hour before scheduled procedure and one tab when you arrive in office 2 tablet 0  ? aspirin EC 81 MG tablet Take 81 mg by mouth daily. Swallow whole.    ? betamethasone dipropionate 0.05 % lotion Apply topically 2 (two) times daily as needed (Rash). Apply thin coat to affected areas twice daily for 2 weeks 60 mL 0  ? cetirizine (ZYRTEC) 10 MG tablet Take 10 mg by mouth daily.    ? chlorhexidine (PERIDEX) 0.12 % solution Use as directed 15 mLs in the mouth or throat as needed (mouth sores).    ? cholecalciferol (VITAMIN D3) 25 MCG (1000 UT) tablet Take 1,000 Units by mouth daily.    ? clindamycin (CLEOCIN T) 1 % external solution Apply to sores on scalp QD PRN. 60 mL 3  ? clindamycin (CLEOCIN) 300 MG capsule Take 300 mg by mouth 3 (three) times daily.    ? cyanocobalamin (,VITAMIN B-12,) 1000 MCG/ML injection Inject 1,000 mcg into the muscle every 30 (thirty) days.    ? estradiol (ESTRACE VAGINAL) 0.1 MG/GM vaginal cream Apply a pea size amount 3 nights a week 42.5 g 12  ? gabapentin (NEURONTIN) 100 MG capsule Take 100 mg by mouth every morning.    ? gabapentin (NEURONTIN) 400  MG capsule Take 400 mg by mouth at bedtime.    ? lidocaine (XYLOCAINE) 2 % solution Use as directed 15 mLs in the mouth or throat as needed for mouth pain.    ? magnesium oxide (MAG-OX) 400 MG tablet Take 400 mg by mouth every Monday, Wednesday, and Friday.    ? methocarbamol (ROBAXIN) 500 MG tablet Take 500 mg by mouth 3 (three) times daily.    ? Multiple Vitamin (MULTIVITAMIN WITH MINERALS) TABS tablet Take 1 tablet by mouth daily.    ? nortriptyline (PAMELOR) 10 MG capsule Take 10 mg nightly for one week, then increase to 20 mg nightly    ? omeprazole (PRILOSEC) 40 MG capsule Take 40 mg by mouth daily before breakfast.    ? potassium chloride SA (KLOR-CON M) 20 MEQ tablet Take by mouth.    ? predniSONE (DELTASONE) 1 MG tablet Take 3 mg by mouth daily with breakfast.     ? predniSONE (STERAPRED UNI-PAK 21 TAB) 5 MG (21) TBPK tablet Take by mouth as directed.    ? Probiotic Product (PROBIOTIC PO) Take 1 capsule by mouth daily.     ? RESTASIS 0.05 % ophthalmic emulsion 1 drop 2 (two) times daily.    ? rOPINIRole (REQUIP) 1 MG tablet Take 1 mg by mouth at bedtime as needed (for  restless legs).     ? sucralfate (CARAFATE) 1 g tablet Take 0.5 g by mouth 3 (three) times daily with meals as needed (stomach cramping/Crohn's symptoms).    ? Tofacitinib Citrate (XELJANZ) 5 MG TABS Take 5 mg by mouth 2 (two) times daily.    ? traMADol (ULTRAM) 50 MG tablet Take by mouth.    ? acetaminophen (TYLENOL) 650 MG CR tablet Take by mouth.    ? diazepam (VALIUM) 10 MG tablet Take 1 tablet (10 mg total) by mouth daily as needed (vaginal pain with intercourse, use before sexual activity per vagina). (Patient not taking: Reported on 11/09/2021) 30 tablet 0  ? ketoconazole (NIZORAL) 2 % shampoo Shampoo into the scalp let sit 5-10 minutes then wash out. Use twice per week. (Patient not taking: Reported on 11/09/2021) 120 mL 5  ? ?No current facility-administered medications for this visit.  ? ? ?Past Medical History:  ?Diagnosis Date  ?  Anemia   ? iron everyday, 3 iron infusions last one August 2020  ? Anxiety   ? nervous  ? Arthritis   ? rheumatoid  ? Collagen vascular disease ([place])   ? Crohn disease ([place])   ? Crohn's disease ([place])   ? Crohn's disease ([place])   ? DDD (degenerative disc disease), cervical   ? DDD (degenerative disc disease), cervical   ? DDD (degenerative disc disease), cervical 2003  ? DDD (degenerative disc disease), cervical   ? DDD (degenerative disc disease), lumbar   ? Degenerative disc disease, cervical   ? Depression   ? Diarrhea   ? Dyspnea   ? out of shape  ? Fatty liver   ? GERD (gastroesophageal reflux disease)   ? Hand weakness   ? left hand worse, small tremors  ? Headache   ? with accident  ? History of chicken pox   ? History of claustrophobia   ? History of kidney stones   ? History of shingles   ? HOH (hard of hearing)   ? left ear  ? Immune deficiency disorder ([place])   ? Lymphatic disorder   ? Meniere's disease   ? Motion sickness   ? Nasal fracture 03/27/2020  ? due to fall  ? Neuromuscular disorder ([place])   ? Neuropathy   ? legs  ? Peripheral neuropathy   ? Pyelonephritis   ? 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 UTI (urinary tract infection)   ? HO  ? Vertigo   ? can be accompanied by nausea  ? Wears dentures   ? upper dentures  ? ? ?Past Surgical History:  ?Procedure Laterality Date  ? ABDOMINAL HYSTERECTOMY    ? APPENDECTOMY    ? BACK SURGERY    ? x 3  ? CERVICAL SPINE SURGERY    ? metal plate  ? [place]  ? X 2  ? CHOLECYSTECTOMY    ? CLOSED REDUCTION NASAL FRACTURE N/A 04/09/2020  ? Procedure: CLOSED REDUCTION NASAL FRACTURE;  Surgeon: Margaretha Sheffield, MD;  Location: [place];  Service: ENT;  Laterality: N/A;  ? [place]  ? removed 18 inches of colon,removed appendix  ? COLONOSCOPY    ? COLONOSCOPY WITH PROPOFOL N/A 08/02/2021  ? Procedure: COLONOSCOPY WITH PROPOFOL;  Surgeon: Lesly Rubenstein, MD;  Location: [facility] ENDOSCOPY;  Service: Endoscopy;  Laterality: N/A;  ?  dental implants    ? 3 teeth/ wears dentures  ? DILATION AND CURETTAGE OF UTERUS  2004  ?  ELBOW FRACTURE SURGERY Left 2008  ? FIBEROPTIC BRONCHOSCOPY N/A 06/01/2020  ? Procedure: BEDSIDE BRONCHOSCOPY FIBEROPTIC;  Surgeon: Ottie Glazier, MD;  Location: [facility];  Service: Thoracic;  Laterality: N/A;  ? FRACTURE SURGERY    ? JOINT REPLACEMENT Right 2015  ? partial knee replacement  ? KYPHOPLASTY    ? KYPHOPLASTY N/A 04/14/2015  ? Procedure: KYPHOPLASTY;  Surgeon: Hessie Knows, MD;  Location: [facility];  Service: Orthopedics;  Laterality: N/A;  ? KYPHOPLASTY N/A 06/04/2015  ? Procedure: KYPHOPLASTY L-5;  Surgeon: Hessie Knows, MD;  Location: [facility];  Service: Orthopedics;  Laterality: N/A;  ? KYPHOPLASTY    ? LUMBAR LAMINECTOMY FOR EPIDURAL ABSCESS N/A 10/12/2020  ? Procedure: SYNOVIAL CYST RESECTION;  Surgeon: Deetta Perla, MD;  Location: [facility];  Service: Neurosurgery;  Laterality: N/A;  ? MAXIMUM ACCESS (MAS) TRANSFORAMINAL LUMBAR INTERBODY FUSION (TLIF) 1 LEVEL Left 10/12/2020  ? Procedure: OPEN L5/S1 TRANSFORAMINAL LUMBAR INTERBODY FUSION (TLIF) 1 LEVEL;  Surgeon: Deetta Perla, MD;  Location: [facility];  Service: Neurosurgery;  Laterality: Left;  ? MEDIAL PARTIAL KNEE REPLACEMENT Right 2015  ? NECK/PLATE  3154  ? OOPHORECTOMY Bilateral   ? ORIF NASAL FRACTURE N/A 04/09/2020  ? Procedure: OPEN REDUCTION INTERNAL FIXATION (ORIF) NASAL FRACTURE;  Surgeon: Margaretha Sheffield, MD;  Location: [place];  Service: ENT;  Laterality: N/A;  ? REPAIR EXTENSOR TENDON Left 01/08/2019  ? Procedure: Realignment  EXTENSOR TENDON;  Surgeon: Hessie Knows, MD;  Location: [facility];  Service: Orthopedics;  Laterality: Left;  ? REVERSE SHOULDER ARTHROPLASTY Right 04/18/2019  ? Procedure: REVERSE SHOULDER ARTHROPLASTY;  Surgeon: Corky Mull, MD;  Location: [facility];  Service: Orthopedics;  Laterality: Right;  ? SHOULDER ARTHROSCOPY WITH ROTATOR CUFF REPAIR AND SUBACROMIAL DECOMPRESSION Right 04/13/2016  ? Procedure: SHOULDER  ARTHROSCOPY WITH ROTATOR CUFF REPAIR AND SUBACROMIAL DECOMPRESSION, release of long head biceps tendon;  Surgeon: Leanor Kail, MD;  Location: [facility];  Service: Orthopedics;  Laterality: Right;  ? TOE SURGERY

## 2021-11-09 NOTE — Assessment & Plan Note (Signed)
blood glucose control important in reducing the progression of atherosclerotic disease. Also, involved in wound healing. On appropriate medications.  

## 2021-11-09 NOTE — Assessment & Plan Note (Signed)
blood pressure control important in reducing the progression of atherosclerotic disease. On appropriate oral medications.  

## 2021-11-10 ENCOUNTER — Ambulatory Visit
Admission: RE | Admit: 2021-11-10 | Discharge: 2021-11-10 | Disposition: A | Discharge status: Home / Self Care | Payer: Medicare Other | Source: Ambulatory Visit | Attending: Neurology | Admitting: Neurology

## 2021-11-10 DIAGNOSIS — R202 Paresthesia of skin: Secondary | ICD-10-CM | POA: Diagnosis present

## 2021-11-10 DIAGNOSIS — R531 Weakness: Secondary | ICD-10-CM | POA: Insufficient documentation

## 2021-11-10 DIAGNOSIS — M545 Low back pain, unspecified: Secondary | ICD-10-CM | POA: Diagnosis present

## 2021-11-10 DIAGNOSIS — G8929 Other chronic pain: Secondary | ICD-10-CM | POA: Diagnosis present

## 2021-11-10 DIAGNOSIS — R200 Anesthesia of skin: Secondary | ICD-10-CM | POA: Insufficient documentation

## 2021-11-25 ENCOUNTER — Encounter: Payer: Self-pay | Admitting: Dermatology

## 2021-11-25 ENCOUNTER — Ambulatory Visit: Payer: Medicare Other | Admitting: Dermatology

## 2021-11-25 DIAGNOSIS — L578 Other skin changes due to chronic exposure to nonionizing radiation: Secondary | ICD-10-CM | POA: Diagnosis not present

## 2021-11-25 DIAGNOSIS — B009 Herpesviral infection, unspecified: Secondary | ICD-10-CM

## 2021-11-25 DIAGNOSIS — L820 Inflamed seborrheic keratosis: Secondary | ICD-10-CM | POA: Diagnosis not present

## 2021-11-25 DIAGNOSIS — L821 Other seborrheic keratosis: Secondary | ICD-10-CM

## 2021-11-25 MED ORDER — VALACYCLOVIR HCL 500 MG PO TABS: ORAL_TABLET | ORAL | 5 refills | Status: AC

## 2021-11-25 NOTE — Patient Instructions (Addendum)
Cryotherapy Aftercare ? ?Wash gently with soap and water everyday.   ?Apply Vaseline and Band-Aid daily until healed.  ? ?Prior to procedure, discussed risks of blister formation, small wound, skin dyspigmentation, or rare scar following cryotherapy. Recommend Vaseline ointment to treated areas while healing.  ? ?For Fever Blister: ?Start Valacyclovir 526m one tablet twice daily for 5 days. Start at first sign of symptoms.  ? 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  It is infectious and can be spread from person to person by direct contact.  It is not curable, but is treatable with topical and oral medication. ? ? ?If You Need Anything After Your Visit ? ?If you have any questions or concerns for your doctor, please call our main line at [phone] press option 4 to reach your doctor's medical assistant. If no one answers, please leave a voicemail as directed and we will return your call as soon as possible. Messages left after 4 pm will be answered the following business day.  ? ?You may also send [place] message via MyChart. We typically respond to MyChart messages within 1-2 business days. ? ?For prescription refills, please ask your pharmacy to contact our office. Our fax number is [phone] ? ?If you have an urgent issue when the clinic is closed that cannot wait until the next business day, you can page your doctor at the number below.   ? ?Please note that while we do our best to be available for urgent issues outside of office hours, we are not available 24/7.  ? ?If you have an urgent issue and are unable to reach [place] you may choose to seek medical care at your doctor's office, retail clinic, urgent care center, or emergency room. ? ?If you have a medical emergency, please immediately call 911 or go to the emergency  department. ? ?Pager Numbers ? ?- Dr. KNehemiah Massed [phone]? ?- Dr. MLaurence Ferrari [phone]? ?- Dr. SNicole Kindred [phone]? ?In the event of inclement weather, please call our main line at [phone] an update on the status of any delays or closures. ? ?Dermatology Medication Tips: ?Please keep the boxes that topical medications come in in order to help keep track of the instructions about where and how to use these. Pharmacies typically print the medication instructions only on the boxes and not directly on the medication tubes.  ? ?If your medication is too expensive, please contact our office at [phone] 4 or send [place] message through [place]  ? ?We are unable to tell what your co-pay for medications will be in advance as this is different depending on your insurance coverage. However, we may be able to find a substitute medication at lower cost or fill out paperwork to get insurance to cover a needed medication.  ? ?If a prior authorization is required to get your medication covered by your insurance company, please allow u[place]1-2 business days to complete this process. ? ?Drug prices often vary depending on where the prescription is filled and some pharmacies may offer cheaper prices. ? ?The website www.goodrx.com contains coupons for medications through different pharmacies. The prices here do not account for what the cost may be with help from insurance (it may be cheaper with your insurance), but the website can give you the price if you did not use any insurance.  ?-  You can print the associated coupon and take it with your prescription to the pharmacy.  ?- You may also stop by our office during regular business hours and pick up a GoodRx coupon card.  ?- If you need your prescription sent electronically to a different pharmacy, notify our office through [facility] or by phone at [phone] option 4. ? ? ? ? ?Si Usted Necesita Algo Despu?s de Su Visita ? ?Tambi?n puede enviarnos un  mensaje a trav?s de MyChart. Por lo general respondemos a los mensajes de MyChart en el transcurso de 1 a 2 d?as h?biles. ? ?Para renovar recetas, por favor pida a su farmacia que se ponga en contacto con nuestra oficina. Nuestro n?mero de fax es el [phone]. ? ?Si tiene un asunto urgente cuando la cl?nica est? cerrada y que no puede esperar hasta el siguiente d?a h?bil, puede llamar/localizar a su doctor(a) al n?mero que aparece a continuaci?n.  ? ?Por favor, tenga en cuenta que aunque hacemos todo lo posible para estar disponibles para asuntos urgentes fuera del horario de oficina, no estamos disponibles las 24 horas del d?a, los 7 d?as de la semana.  ? ?Si tiene un problema urgente y no puede comunicarse con nosotros, puede optar por buscar atenci?n m?dica  en el consultorio de su doctor(a), en una cl?nica privada, en un centro de atenci?n urgente o en una sala de emergencias. ? ?Si tiene Engineer, maintenance (IT) m?dica, por favor llame inmediatamente al 911 o vaya a la sala de emergencias. ? ?N?meros de b?per ? ?- Dr. Nehemiah Massed: [phone] ? ?- Dra. Moye: [phone] ? ?- Dra. Nicole Kindred: [phone] ? ?En caso de inclemencias del tiempo, por favor llame a nuestra l?nea principal al [phone] para una actualizaci?n sobre el estado de cualquier retraso o cierre. ? ?Consejos para la medicaci?n en dermatolog?a: ?Por favor, guarde las cajas en las que vienen los medicamentos de uso t?pico para ayudarle a seguir las instrucciones sobre d?nde y c?mo usarlos. Las farmacias generalmente imprimen las instrucciones del medicamento s?lo en las cajas y no directamente en los tubos del [place].  ? ?Si su medicamento es muy caro, por favor, p?ngase en contacto con Zigmund Daniel llamando al [phone] y presione la opci?n 4 o env?enos un mensaje a trav?s de MyChart.  ? ?No podemos decirle cu?l ser? su copago por los medicamentos por adelantado ya que esto es diferente dependiendo de la cobertura de su seguro. Sin embargo,  es posible que podamos encontrar un medicamento sustituto a Electrical engineer un formulario para que el seguro cubra el medicamento que se considera necesario.  ? ?Si se requiere Ardelia Mems autorizaci?n previa para que su compa??a de seguros [place] su medicamento, por favor perm?tanos de 1 a 2 d?as h?biles para completar este proceso. ? ?Los precios de los medicamentos var?an con frecuencia dependiendo del Environmental consultant de d?nde se surte la receta y alguna farmacias pueden ofrecer precios m?s baratos. ? ?El sitio web www.goodrx.com tiene cupones para medicamentos de Airline pilot. Los precios aqu? no tienen en cuenta lo que podr?a costar con la ayuda del seguro (puede ser m?s barato con su seguro), pero el sitio web puede darle el precio si no utiliz? ning?n seguro.  ?- Puede imprimir el cup?n correspondiente y llevarlo con su receta a la farmacia.  ?- Tambi?n puede pasar por nuestra oficina durante el horario de atenci?n regular y recoger una tarjeta de cupones de GoodRx.  ?- Si necesita que su receta se env?e electr?nicamente a Chiropodist, informe  a nuestra oficina a trav?s de MyChart de [place] o por tel?fono llamando al [phone] y presione la opci?n 4.  ?

## 2021-11-25 NOTE — Progress Notes (Signed)
? ?  Follow-Up Visit ?  ?Subjective  ?Nicole Bass is a 71 y.o. female who presents for the following: Seborrheic Keratosis (Face. Had few treated at last visit. Healed well. May have more to treat today) and fever blister (Left upper lip. Dur: ~2 weeks. Used Abreva when first came up. Using Vaseline Jelly now. Does not have Rx Tx ). ?The patient has spots, moles and lesions to be evaluated, some may be new or changing and the patient has concerns that these could be cancer. ? ?The following portions of the chart were reviewed this encounter and updated as appropriate:  Tobacco  Allergies  Meds  Problems  Med Hx  Surg Hx  Fam Hx   ?  ?Review of Systems: No other skin or systemic complaints except as noted in HPI or Assessment and Plan. ? ?Objective  ?Well appearing patient in no apparent distress; mood and affect are within normal limits. ? ?A focused examination was performed including head, including the scalp, face, neck, nose, ears, eyelids, and lips. Relevant physical exam findings are noted in the Assessment and Plan. ? ?right temple  x2, left lateral brow x1 (3) ?Erythematous keratotic or waxy stuck-on papule or plaque. ? ?Left Upper Vermilion Lip ?Erythematous vesicular papule ? ? ?Assessment & Plan  ? ?Actinic Damage ?- chronic, secondary to cumulative UV radiation exposure/sun exposure over time ?- diffuse scaly erythematous macules with underlying dyspigmentation ?- Recommend daily broad spectrum sunscreen SPF 30+ to sun-exposed areas, reapply every 2 hours as needed.  ?- Recommend staying in the shade or wearing long sleeves, sun glasses (UVA+UVB protection) and wide brim hats (4-inch brim around the entire circumference of the hat). ?- Call for new or changing lesions. ? ?Seborrheic Keratoses ?- Stuck-on, waxy, tan-brown papules and/or plaques  ?- Benign-appearing ?- Discussed benign etiology and prognosis. ?- Observe ?- Call for any changes ? ?Inflamed seborrheic keratosis (3) ?right temple   x2, left lateral brow x1 ? ?Destruction of lesion - right temple  x2, left lateral brow x1 ?Complexity: simple   ?Destruction method: cryotherapy   ?Informed consent: discussed and consent obtained   ?Timeout:  patient name, date of birth, surgical site, and procedure verified ?Lesion destroyed using liquid nitrogen: Yes   ?Region frozen until ice ball extended beyond lesion: Yes   ?Outcome: patient tolerated procedure well with no complications   ?Post-procedure details: wound care instructions given   ? 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 Start at first sign of symptoms.  ? 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  It is infectious and can be spread from person to person by direct contact.  It is not curable, but is treatable with topical and oral medication. ? ?valACYclovir (VALTREX) 500 MG tablet - Left Upper Vermilion Lip ?Take 1 tablet twice daily for 5 days for flares. Start at first sign of symptoms. ? ?Return for ISK Follow Up 3-4 months. ? ?I, JEmelia Salisbury CMA, am acting as scribe for DSarina Ser MD. ?Documentation: I have reviewed the above documentation for accuracy and completeness, and I agree with the above. ? ?DSarina Ser MD ? ? ?

## 2021-11-30 ENCOUNTER — Encounter: Payer: Self-pay | Admitting: Dermatology

## 2021-11-30 ENCOUNTER — Telehealth: Payer: Self-pay

## 2021-11-30 NOTE — Telephone Encounter (Signed)
Made appointment for 01/26/22 ?

## 2021-11-30 NOTE — Telephone Encounter (Signed)
Patient was seeing Dr. Marius Ditch in the office for Crohn's disease of both small and large intestine with unspecified complications last time she was seen by Dr. Marius Ditch was 06/17/2020. She did not like Dr. Marius Ditch care and transfer to Dr. Haig Prophet at [facility]. Now patient has referral to see Dr. Allen Norris.  Patient states she does not like Dr. Haig Prophet. Please advise if I can schedule a patient with you to see you since she is jumping around between GI doctors  ?

## 2021-12-03 ENCOUNTER — Telehealth (INDEPENDENT_AMBULATORY_CARE_PROVIDER_SITE_OTHER): Payer: Self-pay | Admitting: Vascular Surgery

## 2021-12-03 NOTE — Telephone Encounter (Signed)
LVM for pt TC and schedule sclero appts ? ?L leg SALINE sclero. no auth req. see fb/jd ?

## 2021-12-06 ENCOUNTER — Telehealth (INDEPENDENT_AMBULATORY_CARE_PROVIDER_SITE_OTHER): Payer: Self-pay | Admitting: Vascular Surgery

## 2021-12-06 NOTE — Telephone Encounter (Signed)
Spoke with pt regarding scheduling sclero appt. After explaining that insurance will need to be called by her to see how much of her deductible has been met and how much they will allow. I did [place] [address] as well as DX code of I83.812 to give to the insurance. Pt states that she will call insurance and call us back to schedule dependent on outcome of call with insurance. Nothing further needed at this time.  ? ?L leg SALINE sclero. No auth req. See fb/jd ?

## 2022-01-25 NOTE — Progress Notes (Unsigned)
Primary Care Physician: [name], MD  Primary Gastroenterologist:  Dr. Lucilla Lame  No chief complaint on file.   HPI: Nicole Bass is a 71 y.o. female here with a history of Crohn's disease.  The patient had been seen by Dr. Marius Ditch in the past and was not happy with her care and went to see Dr. Haig Prophet. The patient had a colonoscopy that showed a ileocolonic anastomosis and biopsies taken during the procedure did not show any sign of active Crohn's disease. The patient was last seen by Dr. Haig Prophet in February of this year. The patient had a CT scan in February that showed:  IMPRESSION: Postoperative change consistent with the prior ileo seek ectomy. No obstructive changes are noted. No bowel dilatation is seen to correspond with the given clinical history. No acute findings to correspond with the given clinical history of Crohn's are noted.   Stable mesenteric thickening unchanged from the prior exam.   Punctate bilateral renal calculi in the lower poles without complicating factors.  In February the patient had a GI panel that was negative and a fecal Attack that was also negative. Dr. Marius Ditch his last note reported:  "?  Small bowel Crohn's with initial presentation of small bowel obstruction s/p terminal ileal resection with primary anastomosis more than 15 years ago not on maintenance therapy, history of rheumatoid arthritis currently maintained on Xeljanz and low-dose prednisone   Possible Crohn's Obtain most recent colonoscopy report as patient is not willing to undergo repeat colonoscopy at this time Recommend to check fecal calprotectin levels Further recommendations based on above studies Okay to continue Lomotil once a day Patient does not have iron, B12 or folate deficiency   Osteoporosis Most recent DEXA scan 01/09/2020 revealed osteopenia Continue Fosamax Continue calcium and vitamin D supplements"    Past Medical History:  Diagnosis Date    Anemia    iron everyday, 3 iron infusions last one August 2020   Anxiety    nervous   Arthritis    rheumatoid   Collagen vascular disease ([place])    Crohn disease ([place])    Crohn's disease ([place])    Crohn's disease ([place])    DDD (degenerative disc disease), cervical    DDD (degenerative disc disease), cervical    DDD (degenerative disc disease), cervical 2003   DDD (degenerative disc disease), cervical    DDD (degenerative disc disease), lumbar    Degenerative disc disease, cervical    Depression    Diarrhea    Dyspnea    out of shape   Fatty liver    GERD (gastroesophageal reflux disease)    Hand weakness    left hand worse, small tremors   Headache    with accident   History of chicken pox    History of claustrophobia    History of kidney stones    History of shingles    HOH (hard of hearing)    left ear   Immune deficiency disorder ([place])    Lymphatic disorder    Meniere's disease    Motion sickness    Nasal fracture 03/27/2020   due to fall   Neuromuscular disorder ([facility])    Neuropathy    legs   Peripheral neuropathy    Pyelonephritis    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     alendronate (FOSAMAX) 70 MG tablet Take 70 mg by mouth every Monday. Take with a full glass of water on an empty stomach.     ALPRAZolam (XANAX) 0.5 MG tablet Take one tab 1 hour before scheduled procedure and one tab when you arrive in office 2 tablet 0   aspirin EC 81 MG tablet Take 81 mg by mouth daily. Swallow whole.     betamethasone dipropionate 0.05 % lotion Apply topically 2 (two) times daily as needed (Rash). Apply thin coat to affected areas twice daily for 2 weeks 60 mL 0   cetirizine (ZYRTEC) 10 MG tablet Take 10 mg by mouth daily.      chlorhexidine (PERIDEX) 0.12 % solution Use as directed 15 mLs in the mouth or throat as needed (mouth sores).     cholecalciferol (VITAMIN D3) 25 MCG (1000 UT) tablet Take 1,000 Units by mouth daily.     clindamycin (CLEOCIN T) 1 % external solution Apply to sores on scalp QD PRN. 60 mL 3   clindamycin (CLEOCIN) 300 MG capsule Take 300 mg by mouth 3 (three) times daily.     cyanocobalamin (,VITAMIN B-12,) 1000 MCG/ML injection Inject 1,000 mcg into the muscle every 30 (thirty) days.     diazepam (VALIUM) 10 MG tablet Take 1 tablet (10 mg total) by mouth daily as needed (vaginal pain with intercourse, use before sexual activity per vagina). (Patient not taking: Reported on 11/09/2021) 30 tablet 0   estradiol (ESTRACE VAGINAL) 0.1 MG/GM vaginal cream Apply a pea size amount 3 nights a week 42.5 g 12   gabapentin (NEURONTIN) 100 MG capsule Take 100 mg by mouth every morning.     gabapentin (NEURONTIN) 400 MG capsule Take 400 mg by mouth at bedtime.     ketoconazole (NIZORAL) 2 % shampoo Shampoo into the scalp let sit 5-10 minutes then wash out. Use twice per week. (Patient not taking: Reported on 11/09/2021) 120 mL 5   lidocaine (XYLOCAINE) 2 % solution Use as directed 15 mLs in the mouth or throat as needed for mouth pain.     magnesium oxide (MAG-OX) 400 MG tablet Take 400 mg by mouth every Monday, Wednesday, and Friday.     methocarbamol (ROBAXIN) 500 MG tablet Take 500 mg by mouth 3 (three) times daily.     Multiple Vitamin (MULTIVITAMIN WITH MINERALS) TABS tablet Take 1 tablet by mouth daily.     nortriptyline (PAMELOR) 10 MG capsule Take 10 mg nightly for one week, then increase to 20 mg nightly     omeprazole (PRILOSEC) 40 MG capsule Take 40 mg by mouth daily before breakfast.     potassium chloride SA (KLOR-CON M) 20 MEQ tablet Take by mouth.     predniSONE (DELTASONE) 1 MG tablet Take 3 mg by mouth daily with breakfast.      predniSONE (STERAPRED UNI-PAK 21 TAB) 5 MG (21) TBPK tablet Take by  mouth as directed.     Probiotic Product (PROBIOTIC PO) Take 1 capsule by mouth daily.      RESTASIS 0.05 % ophthalmic emulsion 1 drop 2 (two) times daily.     rOPINIRole (REQUIP) 1 MG tablet Take 1 mg by mouth at bedtime as needed (for restless legs).      sucralfate (CARAFATE) 1 g tablet Take 0.5 g by mouth 3 (three) times daily with meals as needed (stomach  cramping/Crohn's symptoms).     Tofacitinib Citrate (XELJANZ) 5 MG TABS Take 5 mg by mouth 2 (two) times daily.     traMADol (ULTRAM) 50 MG tablet Take by mouth.     valACYclovir (VALTREX) 500 MG tablet Take 1 tablet twice daily for 5 days for flares. Start at first sign of symptoms. 30 tablet 5   No current facility-administered medications for this visit.    Allergies as of 01/26/2022 - Review Complete 11/30/2021  Allergen Reaction Noted   Ciprofloxacin Itching and Rash 08/31/2016   Levofloxacin Hives and Rash 01/08/2014   Methotrexate  11/21/2017   Methotrexate derivatives  11/21/2017   Other Other (See Comments) and Rash 07/16/2013   Sulfa antibiotics Other (See Comments) 07/16/2013   Trelegy ellipta [fluticasone-umeclidin-vilant]  09/24/2020   Cefazolin Itching and Rash 04/13/2015   Cefprozil Rash 07/16/2013   Cephalosporins Itching and Rash 04/13/2015   Doxycycline Rash 03/02/2021   Enbrel [etanercept] Itching and Rash 04/13/2015   Humira [adalimumab] Itching and Rash 08/04/2015   Hydrocodone-acetaminophen Itching and Other (See Comments) 04/29/2015   Lorabid [loracarbef] Itching and Rash 04/13/2015   Meropenem Itching and Rash 04/13/2015   Oxycodone Itching, Other (See Comments), and Rash 09/06/2017   Oxycodone-acetaminophen Itching 09/06/2017   Primidone Itching and Rash 04/13/2015    ROS:  General: Negative for anorexia, weight loss, fever, chills, fatigue, weakness. ENT: Negative for hoarseness, difficulty swallowing , nasal congestion. CV: Negative for chest pain, angina, palpitations, dyspnea on exertion,  peripheral edema.  Respiratory: Negative for dyspnea at rest, dyspnea on exertion, cough, sputum, wheezing.  GI: See history of present illness. GU:  Negative for dysuria, hematuria, urinary incontinence, urinary frequency, nocturnal urination.  Endo: Negative for unusual weight change.    Physical Examination:   There were no vitals taken for this visit.  General: Well-nourished, well-developed in no acute distress.  Eyes: No icterus. Conjunctivae pink. Lungs: Clear to auscultation bilaterally. Non-labored. Heart: Regular rate and rhythm, no murmurs rubs or gallops.  Abdomen: Bowel sounds are normal, nontender, nondistended, no hepatosplenomegaly or masses, no abdominal bruits or hernia , no rebound or guarding.   Extremities: No lower extremity edema. No clubbing or deformities. Neuro: Alert and oriented x 3.  Grossly intact. Skin: Warm and dry, no jaundice.   Psych: Alert and cooperative, normal mood and affect.  Labs:    Imaging Studies: No results found.  Assessment and Plan:   Nicole Bass is a 71 y.o. y/o female ***     Lucilla Lame, MD. [facility] Regal    Note: This dictation was prepared with Dragon dictation along with smaller phrase technology. Any transcriptional errors that result from this process are unintentional.

## 2022-01-26 ENCOUNTER — Encounter: Payer: Self-pay | Admitting: Gastroenterology

## 2022-01-26 ENCOUNTER — Ambulatory Visit (INDEPENDENT_AMBULATORY_CARE_PROVIDER_SITE_OTHER): Payer: Medicare Other | Admitting: Gastroenterology

## 2022-01-26 VITALS — BP 121/76 | HR 96 | Temp 97.9°F | Ht 62.0 in | Wt 173.0 lb

## 2022-01-26 DIAGNOSIS — K50019 Crohn's disease of small intestine with unspecified complications: Secondary | ICD-10-CM | POA: Diagnosis not present

## 2022-01-26 MED ORDER — LIDOCAINE VISCOUS HCL 2 % MT SOLN: 15.0000 mL | OROMUCOSAL | 1 refills | Status: DC | PRN

## 2022-01-30 NOTE — Progress Notes (Unsigned)
Cardiology Office Note  Date:  01/31/2022   ID:  Nicole Bass, DOB 1950/10/22, MRN [id number]  PCP:  [name], MD   Chief Complaint  Patient presents with   New Patient (Initial Visit)    Self referral to establish care for a history of coronary artery atherosclerotic calcifications  on chest CT. Medications reviewed by the patient verbally.     HPI:  Nicole Bass is a 71 y.o.female patient with a history of  nonsmoker coronary artery atherosclerotic calcifications  on chest CT,  hypertension,  rheumatoid arthritis,  Crohn's disease,  GERD.  mechanical fall at home, hitting her face and chest wall on hardwood floors in 03/2020.  OSA on CPAP Who presents for new patient evaluation of her coronary artery calcification  Previously seen by [facility] cardiology January 2023 Notes indicating a mechanical fall in the past August 2021, has neuropathy evaluated at [facility] with an overall unremarkable work-up with the exception of a large laceration of her nose. She later underwent surgical repair of her nasal septum and bony structures.   Chest CT for evaluation of sternal pain showed left anterior descending, circumflex, and right coronary artery atherosclerotic calcifications, a punctate calcified granuloma in the right middle lobe, left upper lobe tiny punctate calcified granulomas present, and suspected distal bronchiectasis with mild airway plugging in the left lower lobe. She was evaluated by Dr. Lanney Gins.   Lexiscan Myoview on 05/04/2020.  gated review revealed LVEF 75%. SPECT imaging revealed no evidence for scar or ischemia. PFTs were normal.   admitted to [facility] from 3/28-3/31/2022 for syncope following recent back surgery.  Her potassium was 2.8, magnesium 1.6, D-dimer elevated, CTA negative for PE.  Chest CT did show prior granulomatous disease, pulmonary nodular changes in the right upper lobe consistent with prior fungal infection.   30-day Holter monitor was placed as  outpatient, which revealed predominant sinus rhythm with a mean heart rate of 92 bpm. Sinus tachycardia was observed. There was 1 4 beat ventricular run, and premature atrial contractions. The patient was asymptomatic while wearing the Holter monitor.  Changed diet, less potatoes Chronic SOB, followed by pulmonary Has RA, neuropathy (prior hx of falls), DJD  Labs reviewed: A1C: 6.5 Total chol >250  EKG personally reviewed by myself on todays visit  Showing nsr rate 88 bpm, no significant ST or T wave changes   PMH:   has a past medical history of Anemia, Anxiety, Arthritis, Collagen vascular disease ([place]), Crohn disease ([place]), Crohn's disease ([place]), Crohn's disease ([place]), DDD (degenerative disc disease), cervical, DDD (degenerative disc disease), cervical, DDD (degenerative disc disease), cervical (2003), DDD (degenerative disc disease), cervical, DDD (degenerative disc disease), lumbar, Degenerative disc disease, cervical, Depression, Diarrhea, Dyspnea, Fatty liver, GERD (gastroesophageal reflux disease), Hand weakness, Headache, History of chicken pox, History of claustrophobia, History of kidney stones, History of shingles, HOH (hard of hearing), Immune deficiency disorder ([place]), Lymphatic disorder, Meniere's disease, Motion sickness, Nasal fracture (03/27/2020), Neuromuscular disorder ([place]), Neuropathy, Peripheral neuropathy, Pyelonephritis, 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  PSH:    Past Surgical History:  Procedure Laterality Date   ABDOMINAL HYSTERECTOMY     APPENDECTOMY     BACK SURGERY     x 3   CERVICAL SPINE SURGERY     metal plate   CESAREAN SECTION  1976 & 1989   X 2   CHOLECYSTECTOMY     CLOSED REDUCTION NASAL FRACTURE N/A 04/09/2020   Procedure: CLOSED REDUCTION NASAL FRACTURE;  Surgeon: Margaretha Sheffield, MD;  Location: [facility];  Service: ENT;  Laterality: N/A;   [place]   removed 18 inches of  colon,removed appendix   COLONOSCOPY     COLONOSCOPY WITH PROPOFOL N/A 08/02/2021   Procedure: COLONOSCOPY WITH PROPOFOL;  Surgeon: Lesly Rubenstein, MD;  Location: [facility];  Service: Endoscopy;  Laterality: N/A;   dental implants     3 teeth/ wears dentures   DILATION AND CURETTAGE OF UTERUS  2004   ELBOW FRACTURE SURGERY Left 2008   FIBEROPTIC BRONCHOSCOPY N/A 06/01/2020   Procedure: BEDSIDE BRONCHOSCOPY FIBEROPTIC;  Surgeon: Ottie Glazier, MD;  Location: [facility];  Service: Thoracic;  Laterality: N/A;   FRACTURE SURGERY     JOINT REPLACEMENT Right 2015   partial knee replacement   KYPHOPLASTY     KYPHOPLASTY N/A 04/14/2015   Procedure: KYPHOPLASTY;  Surgeon: Hessie Knows, MD;  Location: [facility];  Service: Orthopedics;  Laterality: N/A;   KYPHOPLASTY N/A 06/04/2015   Procedure: KYPHOPLASTY L-5;  Surgeon: Hessie Knows, MD;  Location: [facility];  Service: Orthopedics;  Laterality: N/A;   KYPHOPLASTY     LUMBAR LAMINECTOMY FOR EPIDURAL ABSCESS N/A 10/12/2020   Procedure: SYNOVIAL CYST RESECTION;  Surgeon: Deetta Perla, MD;  Location: [facility];  Service: Neurosurgery;  Laterality: N/A;   MAXIMUM ACCESS (MAS) TRANSFORAMINAL LUMBAR INTERBODY FUSION (TLIF) 1 LEVEL Left 10/12/2020   Procedure: OPEN L5/S1 TRANSFORAMINAL LUMBAR INTERBODY FUSION (TLIF) 1 LEVEL;  Surgeon: Deetta Perla, MD;  Location: [facility];  Service: Neurosurgery;  Laterality: Left;   MEDIAL PARTIAL KNEE REPLACEMENT Right 2015   NECK/PLATE  6720   OOPHORECTOMY Bilateral    ORIF NASAL FRACTURE N/A 04/09/2020   Procedure: OPEN REDUCTION INTERNAL FIXATION (ORIF) NASAL FRACTURE;  Surgeon: Margaretha Sheffield, MD;  Location: [place];  Service: ENT;  Laterality: N/A;   REPAIR EXTENSOR TENDON Left 01/08/2019   Procedure: Realignment  EXTENSOR TENDON;  Surgeon: Hessie Knows, MD;  Location: [facility];  Service: Orthopedics;  Laterality: Left;   REVERSE SHOULDER ARTHROPLASTY Right 04/18/2019   Procedure: REVERSE SHOULDER  ARTHROPLASTY;  Surgeon: Corky Mull, MD;  Location: [facility];  Service: Orthopedics;  Laterality: Right;   SHOULDER ARTHROSCOPY WITH ROTATOR CUFF REPAIR AND SUBACROMIAL DECOMPRESSION Right 04/13/2016   Procedure: SHOULDER ARTHROSCOPY WITH ROTATOR CUFF REPAIR AND SUBACROMIAL DECOMPRESSION, release of long head biceps tendon;  Surgeon: Leanor Kail, MD;  Location: [facility];  Service: Orthopedics;  Laterality: Right;   TOE SURGERY Right 2013   TONSILLECTOMY  1995   UPPER GI ENDOSCOPY     WRIST FRACTURE SURGERY Bilateral     Current Outpatient Medications  Medication Sig Dispense Refill   acetaminophen (TYLENOL) 650 MG CR tablet Take by mouth.     alendronate (FOSAMAX) 70 MG tablet Take 70 mg by mouth every Monday. Take with a full glass of water on an empty stomach.     ALPRAZolam (XANAX) 0.5 MG tablet Take one tab 1 hour before scheduled procedure and one tab when you arrive in office 2 tablet 0   aspirin EC 81 MG tablet Take 81 mg by mouth daily. Swallow whole.     betamethasone dipropionate 0.05 % lotion Apply topically 2 (two) times daily as needed (Rash). Apply thin coat to affected areas twice daily for 2 weeks 60 mL 0   cetirizine (ZYRTEC) 10 MG tablet Take 10 mg by mouth daily.     chlorhexidine (PERIDEX) 0.12 % solution Use as directed 15 mLs in the  mouth or throat as needed (mouth sores).     cholecalciferol (VITAMIN D3) 25 MCG (1000 UT) tablet Take 1,000 Units by mouth daily.     clindamycin (CLEOCIN T) 1 % external solution Apply to sores on scalp QD PRN. 60 mL 3   clindamycin (CLEOCIN) 300 MG capsule Take 300 mg by mouth 3 (three) times daily.     cyanocobalamin (,VITAMIN B-12,) 1000 MCG/ML injection Inject 1,000 mcg into the muscle every 30 (thirty) days.     diazepam (VALIUM) 10 MG tablet Take 1 tablet (10 mg total) by mouth daily as needed (vaginal pain with intercourse, use before sexual activity per vagina). 30 tablet 0   estradiol (ESTRACE VAGINAL) 0.1 MG/GM vaginal cream  Apply a pea size amount 3 nights a week 42.5 g 12   gabapentin (NEURONTIN) 100 MG capsule Take 100 mg by mouth every morning.     gabapentin (NEURONTIN) 400 MG capsule Take 400 mg by mouth at bedtime.     ketoconazole (NIZORAL) 2 % shampoo Shampoo into the scalp let sit 5-10 minutes then wash out. Use twice per week. 120 mL 5   lidocaine (XYLOCAINE) 2 % solution Use as directed 15 mLs in the mouth or throat as needed for mouth pain. 100 mL 1   magnesium oxide (MAG-OX) 400 MG tablet Take 400 mg by mouth every Monday, Wednesday, and Friday.     methocarbamol (ROBAXIN) 500 MG tablet Take 500 mg by mouth 3 (three) times daily.     Multiple Vitamin (MULTIVITAMIN WITH MINERALS) TABS tablet Take 1 tablet by mouth daily.     nortriptyline (PAMELOR) 10 MG capsule Take 10 mg nightly for one week, then increase to 20 mg nightly     omeprazole (PRILOSEC) 40 MG capsule Take 40 mg by mouth daily before breakfast.     potassium chloride SA (KLOR-CON M) 20 MEQ tablet Take by mouth.     predniSONE (DELTASONE) 1 MG tablet Take 3 mg by mouth daily with breakfast.      Probiotic Product (PROBIOTIC PO) Take 1 capsule by mouth daily.      RESTASIS 0.05 % ophthalmic emulsion 1 drop 2 (two) times daily.     rOPINIRole (REQUIP) 1 MG tablet Take 1 mg by mouth at bedtime as needed (for restless legs).      sucralfate (CARAFATE) 1 g tablet Take 0.5 g by mouth 3 (three) times daily with meals as needed (stomach cramping/Crohn's symptoms).     Tofacitinib Citrate (XELJANZ) 5 MG TABS Take 5 mg by mouth 2 (two) times daily.     traMADol (ULTRAM) 50 MG tablet Take by mouth.     valACYclovir (VALTREX) 500 MG tablet Take 1 tablet twice daily for 5 days for flares. Start at first sign of symptoms. 30 tablet 5   traMADol (ULTRAM) 50 MG tablet Take by mouth. (Patient not taking: Reported on 01/31/2022)     No current facility-administered medications for this visit.    Allergies:   Ciprofloxacin, Levofloxacin, Methotrexate,  Methotrexate derivatives, Other, Sulfa antibiotics, Trelegy ellipta [fluticasone-umeclidin-vilant], Cefazolin, Cefprozil, Cephalosporins, Doxycycline, Enbrel [etanercept], Humira [adalimumab], Hydrocodone-acetaminophen, Lorabid [loracarbef], Meropenem, Oxycodone, Oxycodone-acetaminophen, and Primidone   Social History:  The patient  reports that she has never smoked. She has never used smokeless tobacco. She reports that she does not drink alcohol and does not use drugs.   Family History:   family history includes Colon cancer in her maternal grandfather; Diabetes in her mother; Emphysema in her father and mother.  Review of Systems: Review of Systems  Constitutional: Negative.   HENT: Negative.    Respiratory: Negative.    Cardiovascular: Negative.   Gastrointestinal: Negative.   Musculoskeletal: Negative.   Neurological: Negative.   Psychiatric/Behavioral: Negative.    All other systems reviewed and are negative.    PHYSICAL EXAM: VS:  BP 130/70 (BP Location: Right Arm, Patient Position: Sitting, Cuff Size: Normal)   Ht 5' 2"  (1.575 m)   Wt 173 lb 6 oz (78.6 kg)   SpO2 98%   BMI 31.71 kg/m  , BMI Body mass index is 31.71 kg/m. GEN: Well nourished, well developed, in no acute distress HEENT: normal Neck: no JVD, carotid bruits, or masses Cardiac: RRR; no murmurs, rubs, or gallops,no edema  Respiratory:  clear to auscultation bilaterally, normal work of breathing GI: soft, nontender, nondistended, + BS MS: no deformity or atrophy Skin: warm and dry, no rash Neuro:  Strength and sensation are intact Psych: euthymic mood, full affect    Recent Labs: 09/30/2021: Creatinine, Ser 1.00    Lipid Panel No results found for: "CHOL", "HDL", "LDLCALC", "TRIG"    Wt Readings from Last 3 Encounters:  01/31/22 173 lb 6 oz (78.6 kg)  01/26/22 173 lb (78.5 kg)  11/09/21 171 lb 9.6 oz (77.8 kg)       ASSESSMENT AND PLAN:  Problem List Items Addressed This Visit        Cardiology Problems   HTN, goal below 140/80 - Primary   Chronic diastolic CHF (congestive heart failure) (HCC)   Atherosclerosis of native coronary artery of native heart without angina pectoris     Other   Type 2 diabetes mellitus with diabetic neuropathy (HCC)   Syncope   SOB (shortness of breath) on exertion   Coronary artery disease with stable angina CT scan images pulled up and reviewed, she denies significant anginal symptoms No ischemic work-up needed at this time We have recommended aggressive cholesterol management Last modification recommended Suggested we start Crestor 10 mg daily titrating up to 20 mg as tolerated May need to add Zetia Goal total cholesterol less than 150, goal LDL less than 70  Diabetes type 2 with diabetic neuropathy We have encouraged continued exercise, careful diet management in an effort to lose weight.  A1c 6.5 discussed  Leg weakness/fall risk Recommended exercise program for leg strengthening Currently sedentary, concerned about leg weakness and gait instability/falls  Hyperlipidemia Recommend starting Crestor 10 with titration up to 20 mg as above For any cramping may need to decrease the dose and add Zetia     Total encounter time more than 60 minutes  Greater than 50% was spent in counseling and coordination of care with the patient    Signed, Esmond Plants, M.D., Ph.D. [place], [place]

## 2022-01-31 ENCOUNTER — Ambulatory Visit: Payer: Medicare Other | Admitting: Cardiovascular Disease

## 2022-01-31 ENCOUNTER — Encounter: Payer: Self-pay | Admitting: Cardiovascular Disease

## 2022-01-31 VITALS — BP 130/70 | HR 88 | Ht 62.0 in | Wt 173.4 lb

## 2022-01-31 DIAGNOSIS — I25118 Atherosclerotic heart disease of native coronary artery with other forms of angina pectoris: Secondary | ICD-10-CM | POA: Diagnosis not present

## 2022-01-31 DIAGNOSIS — I2510 Atherosclerotic heart disease of native coronary artery without angina pectoris: Secondary | ICD-10-CM

## 2022-01-31 DIAGNOSIS — I10 Essential (primary) hypertension: Secondary | ICD-10-CM

## 2022-01-31 DIAGNOSIS — R55 Syncope and collapse: Secondary | ICD-10-CM

## 2022-01-31 DIAGNOSIS — R0602 Shortness of breath: Secondary | ICD-10-CM | POA: Diagnosis not present

## 2022-01-31 DIAGNOSIS — I5032 Chronic diastolic (congestive) heart failure: Secondary | ICD-10-CM

## 2022-01-31 DIAGNOSIS — E1140 Type 2 diabetes mellitus with diabetic neuropathy, unspecified: Secondary | ICD-10-CM

## 2022-01-31 MED ORDER — ROSUVASTATIN CALCIUM 20 MG PO TABS: 20.0000 mg | ORAL_TABLET | Freq: Every day | ORAL | 3 refills | Status: DC

## 2022-01-31 NOTE — Patient Instructions (Signed)
Medication Instructions:  Please start crestor 20 mg daily  If you need a refill on your cardiac medications before your next appointment, please call your pharmacy.   Lab work: No new labs needed  Testing/Procedures: No new testing needed  Follow-Up: At [facility], you and your health needs are our priority.  As part of our continuing mission to provide you with exceptional heart care, we have created designated Provider Care Teams.  These Care Teams include your primary Cardiologist (physician) and Advanced Practice Providers (APPs -  Physician Assistants and Nurse Practitioners) who all work together to provide you with the care you need, when you need it.  You will need a follow up appointment in 12 months  Providers on your designated Care Team:   Murray Hodgkins, NP Christell Faith, PA-C Cadence Kathlen Mody, [place]  COVID-19 Vaccine Information can be found at: [email or web address].uk For questions related to vaccine distribution or appointments, please email [email or web address] or call [phone].

## 2022-02-23 DIAGNOSIS — E782 Mixed hyperlipidemia: Secondary | ICD-10-CM | POA: Insufficient documentation

## 2022-02-24 ENCOUNTER — Telehealth (INDEPENDENT_AMBULATORY_CARE_PROVIDER_SITE_OTHER): Payer: Self-pay | Admitting: Vascular Surgery

## 2022-02-24 NOTE — Telephone Encounter (Signed)
After receiving referral from Dr. Doy Hutching regarding varicose veins, I LVM for pt letting her know that I had received it. Wanted to know if she was needing to come in for the sclero appts. Pt was going to call us back to schedule. No PA is needed so if pt is just wanting to move forward with sclero, then we can schedule X 3 with Eulogio Ditch, NP. If she is needing something different, we will bring her in for a referral.

## 2022-03-03 ENCOUNTER — Ambulatory Visit: Payer: Medicare Other | Admitting: Dermatology

## 2022-03-11 ENCOUNTER — Telehealth: Payer: Self-pay | Admitting: Cardiovascular Disease

## 2022-03-11 NOTE — Telephone Encounter (Signed)
Left voicemail message to call back  

## 2022-03-11 NOTE — Telephone Encounter (Signed)
Spoke with patient and she took it for 2 weeks. At night her legs felt something like a cramp. She then did not feel good and was very fatigued. She changed times from AM to PM. She then stopped medication and her symptoms resolved. Advised that I would send this over to provider for his review and any further recommendations. She verbalized understanding with no further questions at this time.

## 2022-03-11 NOTE — Telephone Encounter (Signed)
Pt returning a call, she states she is home now and should answer.

## 2022-03-11 NOTE — Telephone Encounter (Signed)
Pt c/o medication issue:  1. Name of Medication: rosuvastatin (CRESTOR) 20 MG tablet  2. How are you currently taking this medication (dosage and times per day)? As written  3. Are you having a reaction (difficulty breathing--STAT)? no  4. What is your medication issue? Pt states she has been really sluggish and fatigue She also states she has the funny, cramp feeling in her leg. She states she was told to call back if she had any issues with medication, please advise.

## 2022-03-14 NOTE — Telephone Encounter (Signed)
Nicole Merritts, MD     Can we find out if she can tolerate the Crestor 10 mg daily  Cannot tell from the note if she is taking half dose or full 20 mg  Thx  TGollan   Called patient. No answer. Detailed message left per DPR. Advised patient to call back or send mychart message.

## 2022-03-15 NOTE — Telephone Encounter (Signed)
Pt called back stating she is taking the full 39m once every evening.

## 2022-03-16 MED ORDER — ROSUVASTATIN CALCIUM 10 MG PO TABS: 10.0000 mg | ORAL_TABLET | Freq: Every day | ORAL | 3 refills | Status: DC

## 2022-03-16 NOTE — Telephone Encounter (Signed)
Nicole Merritts, MD     She could hold the Crestor for a week or 2 until she feels normal   then would retry 10 mg daily  Thx  TG    Called patient and went over recommendations. Pt verbalized understanding and will hold crestor for 2 weeks then try taking 10 mg daily. She will call to let us know if she is having problems on the 10 mg qd

## 2022-03-16 NOTE — Addendum Note (Signed)
Addended by: Mila Merry on: 03/16/2022 02:13 PM   Modules accepted: Orders

## 2022-03-17 ENCOUNTER — Ambulatory Visit: Payer: Medicare Other | Admitting: Urology

## 2022-03-17 VITALS — BP 115/79 | HR 102 | Ht 62.0 in | Wt 170.0 lb

## 2022-03-17 DIAGNOSIS — Z87442 Personal history of urinary calculi: Secondary | ICD-10-CM

## 2022-03-17 DIAGNOSIS — R109 Unspecified abdominal pain: Secondary | ICD-10-CM

## 2022-03-17 LAB — URINALYSIS, COMPLETE
Bilirubin, UA: NEGATIVE
Glucose, UA: NEGATIVE
Leukocytes,UA: NEGATIVE
Nitrite, UA: NEGATIVE
Protein,UA: NEGATIVE
Specific Gravity, UA: 1.02 (ref 1.005–1.030)
Urobilinogen, Ur: 0.2 mg/dL (ref 0.2–1.0)
pH, UA: 5.5 (ref 5.0–7.5)

## 2022-03-17 LAB — MICROSCOPIC EXAMINATION: Bacteria, UA: NONE SEEN

## 2022-03-17 NOTE — Progress Notes (Signed)
03/17/22 9:08 AM   [name] Aug 28, 1950 [id number]  Referring provider:  [name], MD [facility] [place],  [place] [address] Chief Complaint  Patient presents with   Flank Pain     HPI: Nicole Bass is a 71 y.o.female with a personal history of weak stream, history of kidney stones, and recurrent UTIs, and dyspareunia on estrogen cream who presents today for further evaluation.   Her most recent imaging was on 09/30/2021 in the form of CT abdomen and pelvis. It visualized punctate bilateral renal calculi in the lower poles without complicating factors.  Imaging prior to this was negative for stone disease.  She is never had an acute stone event.  She reports pain in her right kidney that has been ongoing for 3 weeks she reports that it feels like a knot and it is constant the level of pan is waxing and waning. She has a pinched nerve in her spine and underwent surgery last year and is now being referred to chronic pain.  Notably, her pain improves with stretching and heating pads.  She denies any dysuria or gross hematuria.  No nausea or vomiting.  PMH: Past Medical History:  Diagnosis Date   Anemia    iron everyday, 3 iron infusions last one August 2020   Anxiety    nervous   Arthritis    rheumatoid   Collagen vascular disease ([place])    Crohn disease ([place])    Crohn's disease ([place])    Crohn's disease ([place])    DDD (degenerative disc disease), cervical    DDD (degenerative disc disease), cervical    DDD (degenerative disc disease), cervical 2003   DDD (degenerative disc disease), cervical    DDD (degenerative disc disease), lumbar    Degenerative disc disease, cervical    Depression    Diarrhea    Dyspnea    out of shape   Fatty liver    GERD (gastroesophageal reflux disease)    Hand weakness    left hand worse, small tremors   Headache    with accident   History of chicken pox    History of claustrophobia     History of kidney stones    History of shingles    HOH (hard of hearing)    left ear   Immune deficiency disorder ([place])    Lymphatic disorder    Meniere's disease    Motion sickness    Nasal fracture 03/27/2020   due to fall   Neuromuscular disorder (H[facility])    Neuropathy    legs   Peripheral neuropathy    Pyelonephritis    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[place])    UTI (urinary tract infection)    HO   Vertigo    can be accompanied by nausea   Wears dentures    upper dentures    Surgical History: Past Surgical History:  Procedure Laterality Date   ABDOMINAL HYSTERECTOMY     APPENDECTOMY     BACK SURGERY     x 3   CERVICAL SPINE SURGERY     metal plate   CESAREAN SECTION  1976 & 1989   X 2   CHOLECYSTECTOMY     CLOSED REDUCTION NASAL FRACTURE N/A 04/09/2020   Procedure: CLOSED REDUCTION NASAL FRACTURE;  Surgeon: Margaretha Sheffield, MD;  Location: [employer] [facility];  Service: ENT;  Laterality: N/A;   [place]   removed 18 inches of colon,removed appendix  COLONOSCOPY     COLONOSCOPY WITH PROPOFOL N/A 08/02/2021   Procedure: COLONOSCOPY WITH PROPOFOL;  Surgeon: Lesly Rubenstein, MD;  Location: [facility];  Service: Endoscopy;  Laterality: N/A;   dental implants     3 teeth/ wears dentures   DILATION AND CURETTAGE OF UTERUS  2004   ELBOW FRACTURE SURGERY Left 2008   FIBEROPTIC BRONCHOSCOPY N/A 06/01/2020   Procedure: BEDSIDE BRONCHOSCOPY FIBEROPTIC;  Surgeon: Ottie Glazier, MD;  Location: [facility];  Service: Thoracic;  Laterality: N/A;   FRACTURE SURGERY     JOINT REPLACEMENT Right 2015   partial knee replacement   KYPHOPLASTY     KYPHOPLASTY N/A 04/14/2015   Procedure: KYPHOPLASTY;  Surgeon: Hessie Knows, MD;  Location: [facility];  Service: Orthopedics;  Laterality: N/A;   KYPHOPLASTY N/A 06/04/2015   Procedure: KYPHOPLASTY L-5;  Surgeon: Hessie Knows, MD;  Location: [facility];  Service: Orthopedics;  Laterality: N/A;   KYPHOPLASTY     LUMBAR LAMINECTOMY FOR  EPIDURAL ABSCESS N/A 10/12/2020   Procedure: SYNOVIAL CYST RESECTION;  Surgeon: Deetta Perla, MD;  Location: [facility];  Service: Neurosurgery;  Laterality: N/A;   MAXIMUM ACCESS (MAS) TRANSFORAMINAL LUMBAR INTERBODY FUSION (TLIF) 1 LEVEL Left 10/12/2020   Procedure: OPEN L5/S1 TRANSFORAMINAL LUMBAR INTERBODY FUSION (TLIF) 1 LEVEL;  Surgeon: Deetta Perla, MD;  Location: [facility];  Service: Neurosurgery;  Laterality: Left;   MEDIAL PARTIAL KNEE REPLACEMENT Right 2015   NECK/PLATE  9798   OOPHORECTOMY Bilateral    ORIF NASAL FRACTURE N/A 04/09/2020   Procedure: OPEN REDUCTION INTERNAL FIXATION (ORIF) NASAL FRACTURE;  Surgeon: Margaretha Sheffield, MD;  Location: [facility];  Service: ENT;  Laterality: N/A;   REPAIR EXTENSOR TENDON Left 01/08/2019   Procedure: Realignment  EXTENSOR TENDON;  Surgeon: Hessie Knows, MD;  Location: [facility];  Service: Orthopedics;  Laterality: Left;   REVERSE SHOULDER ARTHROPLASTY Right 04/18/2019   Procedure: REVERSE SHOULDER ARTHROPLASTY;  Surgeon: Corky Mull, MD;  Location: [facility];  Service: Orthopedics;  Laterality: Right;   SHOULDER ARTHROSCOPY WITH ROTATOR CUFF REPAIR AND SUBACROMIAL DECOMPRESSION Right 04/13/2016   Procedure: SHOULDER ARTHROSCOPY WITH ROTATOR CUFF REPAIR AND SUBACROMIAL DECOMPRESSION, release of long head biceps tendon;  Surgeon: Leanor Kail, MD;  Location: [facility];  Service: Orthopedics;  Laterality: Right;   TOE SURGERY Right 2013   TONSILLECTOMY  1995   UPPER GI ENDOSCOPY     WRIST FRACTURE SURGERY Bilateral     Home Medications:  Allergies as of 03/17/2022       Reactions   Ciprofloxacin Itching, Rash   Levofloxacin Hives, Rash   Methotrexate    Other reaction(s): Other (See Comments) Mouth Ulcers/[place] Mouth and throat ulcers   Methotrexate Derivatives    Mouth and throat ulcers   Other Other (See Comments), Rash   Other reaction(s): Other (See Comments), Unknown Per pts MD she is unable to take this medication because it  is contraindicated with her other medications.   Per pts MD she is unable to take this medication because it is contraindicated with her other medications.   Other reaction(s): Other (See Comments), Unknown Other reaction(s): Other (See Comments), Unknown Per pts MD she is unable to take this medication because it is contraindicated with her other medications.   Per pts MD she is unable to take this medication because it is contraindicated with her other medications.   Other reaction(s): Other (See Comments), Unknown Other reaction(s): Other (See Comments), Unknown Per pts MD she is unable to take this medication  because it is contraindicated with her  Per pts MD she is unable to take this medication because it is contraindicated with her other medications.   Other reaction(s): Other (See Comments) Other reaction(s): Other (See Comments), Unknown Per pts MD she is unable to take this medication because it is contraindicated with her other medications.   Per pts MD she is unable to take this medication because it is contraindicated with her other medications.   Other reaction(s): Other (See Comments), Unknown Other reaction(s): Other (See Comments), Unknown Per pts MD she is unable to take this medication because it is contraindicated with her other medications.   Per pts MD she is unable to take this medication because it is contraindicated with her other medications.   Other reaction(s): Other (See Comments), Unknown Other reaction(s): Other (See Comments), Unknown Per pts MD she is unable to take this medication because it is contraindicated with her  Per pts MD she is unable to take this medication because it is contraindicated with her other medications.     Sulfa Antibiotics Other (See Comments)   Per pts MD she is unable to take this medication because it is contraindicated with her other medications.     Trelegy Ellipta [fluticasone-umeclidin-vilant]    Laryngitis    Cefazolin  Itching, Rash   Cefprozil Rash   Cephalosporins Itching, Rash   Doxycycline Rash   Pt tolerates Z pack and amoxicillin   Enbrel [etanercept] Itching, Rash   Humira [adalimumab] Itching, Rash   Hydrocodone-acetaminophen Itching, Other (See Comments)   Itching only   Lorabid [loracarbef] Itching, Rash   Meropenem Itching, Rash   Oxycodone Itching, Other (See Comments), Rash   Other reaction(s): Other (See Comments)   Oxycodone-acetaminophen Itching   Primidone Itching, Rash        Medication List        Accurate as of March 17, 2022 11:59 PM. If you have any questions, ask your nurse or doctor.          STOP taking these medications    methocarbamol 500 MG tablet Commonly known as: ROBAXIN   nortriptyline 10 MG capsule Commonly known as: PAMELOR       TAKE these medications    acetaminophen 650 MG CR tablet Commonly known as: TYLENOL Take by mouth.   alendronate 70 MG tablet Commonly known as: FOSAMAX Take 70 mg by mouth every Monday. Take with a full glass of water on an empty stomach.   ALPRAZolam 0.5 MG tablet Commonly known as: Xanax Take one tab 1 hour before scheduled procedure and one tab when you arrive in office   aspirin EC 81 MG tablet Take 81 mg by mouth daily. Swallow whole.   betamethasone dipropionate 0.05 % lotion Apply topically 2 (two) times daily as needed (Rash). Apply thin coat to affected areas twice daily for 2 weeks   cetirizine 10 MG tablet Commonly known as: ZYRTEC Take 10 mg by mouth daily.   chlorhexidine 0.12 % solution Commonly known as: PERIDEX Use as directed 15 mLs in the mouth or throat as needed (mouth sores).   cholecalciferol 25 MCG (1000 UNIT) tablet Commonly known as: VITAMIN D3 Take 1,000 Units by mouth daily.   clindamycin 1 % external solution Commonly known as: CLEOCIN T Apply to sores on scalp QD PRN.   clindamycin 300 MG capsule Commonly known as: CLEOCIN Take 300 mg by mouth 3 (three) times  daily.   cyanocobalamin 1000 MCG/ML injection Commonly known as: VITAMIN B12  Inject 1,000 mcg into the muscle every 30 (thirty) days.   diazepam 10 MG tablet Commonly known as: Valium Take 1 tablet (10 mg total) by mouth daily as needed (vaginal pain with intercourse, use before sexual activity per vagina).   estradiol 0.1 MG/GM vaginal cream Commonly known as: ESTRACE VAGINAL Apply a pea size amount 3 nights a week   gabapentin 400 MG capsule Commonly known as: NEURONTIN Take 400 mg by mouth at bedtime.   gabapentin 100 MG capsule Commonly known as: NEURONTIN Take 100 mg by mouth every morning.   ketoconazole 2 % shampoo Commonly known as: NIZORAL Shampoo into the scalp let sit 5-10 minutes then wash out. Use twice per week.   lidocaine 2 % solution Commonly known as: XYLOCAINE Use as directed 15 mLs in the mouth or throat as needed for mouth pain.   magnesium oxide 400 MG tablet Commonly known as: MAG-OX Take 400 mg by mouth every Monday, Wednesday, and Friday.   multivitamin with minerals Tabs tablet Take 1 tablet by mouth daily.   omeprazole 40 MG capsule Commonly known as: PRILOSEC Take 40 mg by mouth daily before breakfast.   potassium chloride SA 20 MEQ tablet Commonly known as: KLOR-CON M Take by mouth.   predniSONE 1 MG tablet Commonly known as: DELTASONE Take 3 mg by mouth daily with breakfast.   PROBIOTIC PO Take 1 capsule by mouth daily.   Restasis 0.05 % ophthalmic emulsion Generic drug: cycloSPORINE 1 drop 2 (two) times daily.   rOPINIRole 1 MG tablet Commonly known as: REQUIP Take 1 mg by mouth at bedtime as needed (for restless legs).   rosuvastatin 10 MG tablet Commonly known as: CRESTOR Take 1 tablet (10 mg total) by mouth daily.   sucralfate 1 g tablet Commonly known as: CARAFATE Take 0.5 g by mouth 3 (three) times daily with meals as needed (stomach cramping/Crohn's symptoms).   valACYclovir 500 MG tablet Commonly known as:  VALTREX Take 1 tablet twice daily for 5 days for flares. Start at first sign of symptoms.   Xeljanz 5 MG Tabs Generic drug: Tofacitinib Citrate Take 1 tablet by mouth 2 (two) times daily.        Allergies:  Allergies  Allergen Reactions   Ciprofloxacin Itching and Rash   Levofloxacin Hives and Rash   Methotrexate     Other reaction(s): Other (See Comments) Mouth Ulcers/[place] Mouth and throat ulcers   Methotrexate Derivatives     Mouth and throat ulcers   Other Other (See Comments) and Rash    Other reaction(s): Other (See Comments), Unknown Per pts MD she is unable to take this medication because it is contraindicated with her other medications.   Per pts MD she is unable to take this medication because it is contraindicated with her other medications.   Other reaction(s): Other (See Comments), Unknown Other reaction(s): Other (See Comments), Unknown Per pts MD she is unable to take this medication because it is contraindicated with her other medications.   Per pts MD she is unable to take this medication because it is contraindicated with her other medications.   Other reaction(s): Other (See Comments), Unknown Other reaction(s): Other (See Comments), Unknown Per pts MD she is unable to take this medication because it is contraindicated with her  Per pts MD she is unable to take this medication because it is contraindicated with her other medications.   Other reaction(s): Other (See Comments) Other reaction(s): Other (See Comments), Unknown Per pts MD she is  unable to take this medication because it is contraindicated with her other medications.   Per pts MD she is unable to take this medication because it is contraindicated with her other medications.   Other reaction(s): Other (See Comments), Unknown Other reaction(s): Other (See Comments), Unknown Per pts MD she is unable to take this medication because it is contraindicated with her other medications.   Per pts MD she is  unable to take this medication because it is contraindicated with her other medications.   Other reaction(s): Other (See Comments), Unknown Other reaction(s): Other (See Comments), Unknown Per pts MD she is unable to take this medication because it is contraindicated with her  Per pts MD she is unable to take this medication because it is contraindicated with her other medications.     Sulfa Antibiotics Other (See Comments)    Per pts MD she is unable to take this medication because it is contraindicated with her other medications.     Trelegy Ellipta [Fluticasone-Umeclidin-Vilant]     Laryngitis    Cefazolin Itching and Rash   Cefprozil Rash   Cephalosporins Itching and Rash   Doxycycline Rash    Pt tolerates Z pack and amoxicillin   Enbrel [Etanercept] Itching and Rash   Humira [Adalimumab] Itching and Rash   Hydrocodone-Acetaminophen Itching and Other (See Comments)    Itching only   Lorabid [Loracarbef] Itching and Rash   Meropenem Itching and Rash   Oxycodone Itching, Other (See Comments) and Rash    Other reaction(s): Other (See Comments)   Oxycodone-Acetaminophen Itching   Primidone Itching and Rash    Family History: Family History  Problem Relation Age of Onset   Diabetes Mother    Emphysema Mother    Emphysema Father    Colon cancer Maternal Grandfather    Breast cancer Neg Hx    Ovarian cancer Neg Hx     Social History:  reports that she has never smoked. She has never used smokeless tobacco. She reports that she does not drink alcohol and does not use drugs.   Physical Exam: BP 115/79   Pulse (!) 102   Ht 5' 2"  (1.575 m)   Wt 170 lb (77.1 kg)   BMI 31.09 kg/m   Constitutional:  Alert and oriented, No acute distress. HEENT: [place] AT, moist mucus membranes.  Trachea midline, no masses. Cardiovascular: No clubbing, cyanosis, or edema. Respiratory: Normal respiratory effort, no increased work of breathing. Skin: No rashes, bruises or suspicious  lesions. Neurologic: Grossly intact, no focal deficits, moving all 4 extremities. Psychiatric: Normal mood and affect.  Laboratory Data:  Lab Results  Component Value Date   CREATININE 1.00 09/30/2021     Pertinent Imaging: CLINICAL DATA:  Abdominal pain and bloating for 2 months, history of Crohn's disease with prior surgical resection, initial encounter   EXAM: CT ABDOMEN AND PELVIS WITH CONTRAST   TECHNIQUE: Multidetector CT imaging of the abdomen and pelvis was performed using the standard protocol following bolus administration of intravenous contrast.   RADIATION DOSE REDUCTION: This exam was performed according to the departmental dose-optimization program which includes automated exposure control, adjustment of the mA and/or kV according to patient size and/or use of iterative reconstruction technique.   CONTRAST:  60m OMNIPAQUE IOHEXOL 300 MG/ML  SOLN   COMPARISON:  04/08/2021   FINDINGS: Lower chest: No acute abnormality.   Hepatobiliary: Fatty infiltration of the liver is noted. Gallbladder has been surgically removed.   Pancreas: Unremarkable. No pancreatic ductal dilatation or  surrounding inflammatory changes.   Spleen: Normal in size without focal abnormality.   Adrenals/Urinary Tract: Adrenal glands are within normal limits. Kidneys demonstrate a normal enhancement pattern bilaterally. Small 2-3 mm nonobstructing right renal stone is noted in the lower pole. Punctate calcification is noted in the lower pole of the left kidney. No obstructive changes are seen. Ureters are within normal limits. The bladder is decompressed.   Stomach/Bowel: Postsurgical changes are noted in the right colon with primary small bowel to colon anastomosis. Appendix has been surgically removed. No obstructive changes are seen. Small bowel and stomach are unremarkable.   Vascular/Lymphatic: No significant vascular findings are present. No enlarged abdominal or pelvic  lymph nodes. Mesenteric thickening is again identified centrally although stable in appearance from the prior exam. Focal mass lesion is noted.   Reproductive: Status post hysterectomy. No adnexal masses.   Other: No abdominal wall hernia or abnormality. No abdominopelvic ascites.   Musculoskeletal: Degenerative changes of lumbar spine are noted. There are findings of prior vertebral augmentation at both L1 and L5 as well as prior L5-1 fusion with posterior fixation. No acute compression deformity is noted.   IMPRESSION: Postoperative change consistent with the prior ileo seek ectomy. No obstructive changes are noted. No bowel dilatation is seen to correspond with the given clinical history. No acute findings to correspond with the given clinical history of Crohn's are noted.   Stable mesenteric thickening unchanged from the prior exam.   Punctate bilateral renal calculi in the lower poles without complicating factors.     Electronically Signed   By: Inez Catalina M.D.   On: 10/01/2021 19:52   I have personally reviewed the images and agree with radiologist interpretation.   Assessment & Plan:    1. Right flank pain/ history of nephrolithiasis  - Likely musculoskeletal in nature based on her history as well as the nature of the pain - UA benign for hematuria  - CT in February was reviewed and reassuring with minimal stone burden  - Will further evaluate with RUS to ensure no interval development of hydronephrosis or significant stone burden  Call with renal ultrasound results  Conley Rolls as a scribe for Hollice Espy, MD.,have documented all relevant documentation on the behalf of Hollice Espy, MD,as directed by  Hollice Espy, MD while in the presence of Hollice Espy, MD.  I have reviewed the above documentation for accuracy and completeness, and I agree with the above.   Hollice Espy, MD   [facility] [address] [place], [place] [address] [phone]

## 2022-03-21 ENCOUNTER — Telehealth: Payer: Self-pay | Admitting: Cardiovascular Disease

## 2022-03-21 NOTE — Telephone Encounter (Signed)
Pt c/o swelling: STAT is pt has developed SOB within 24 hours  If swelling, where is the swelling located? Both Feet, ankles, legs and fingers  How much weight have you gained and in what time span? 7-8 pounds in a week  Have you gained 3 pounds in a day or 5 pounds in a week? yes  Do you have a log of your daily weights (if so, list)? No, nurse came out today to do a yearly f/u for insurance purposes  Are you currently taking a fluid pill? Aspirin everyday  Are you currently SOB? no  Have you traveled recently? No

## 2022-03-22 NOTE — Telephone Encounter (Signed)
Patient called back to follow-up on the swelling in her feet and hands.  Patient thinks this may be water retention.  Pt c/o swelling: STAT is pt has developed SOB within 24 hours  If swelling, where is the swelling located? Both hands, feet, ankle and legs  How much weight have you gained and in what time span? 9 lbs in week  Have you gained 3 pounds in a day or 5 pounds in a week?   Do you have a log of your daily weights (if so, list)? No  Are you currently taking a fluid pill? Yes  Are you currently SOB?   A littl  Have you traveled recently? No  Patient stated her BP was 122/74.

## 2022-03-23 NOTE — Telephone Encounter (Addendum)
Spoke with the patient.  Patient reports a 9 lb pound weight gain in 1 week. Pt denies sob, orthopnea, pnd. Pt reports LE edema and swelling in both hands. Pt sts that Dr. Rockey Situ started her on Crestor several weeks ago. Shortly after she developed muscle aches and pain. She contacted Dr. Rockey Situ and was told to cut her tablets in half and take a half of a tablet daily.  Pt sts that she has not been having any muscle aches, but thinks that the statin is contributing to her fluid retention. She is not on a fluid pill. She does add salt to her food and does not monitor her sodium intake. She has a home nurse that comes out to her home weekly. The nurse was at her home on Monday. Her VS (BP 122/74) were good, her lungs were clear, but the nurse was concerned about the edema and advised her to call her Cardiologist.  Pt called the office on 8/7 and 8/8 and is frustrated because she never received a call back from the nurse. I apologized to the pt for the late returned call.  Adv the pt that I did not think her fluid retention was due to her statin. Adv the pt to HOLD rosuvastatin for now. Adv the patient that due to her rapid weight gain and swelling I do think she needs to be evaluated. Patient agreed. Adv the patient that Dr. Rockey Situ is on vacation this week. Scheduled an appt with the DOD Dr. Garen Lah on 03/24/22 @ 10:20 am. Patient was very appreciative for the assistance.

## 2022-03-24 ENCOUNTER — Ambulatory Visit: Payer: Medicare Other | Admitting: Cardiology

## 2022-03-24 ENCOUNTER — Encounter: Payer: Self-pay | Admitting: Cardiology

## 2022-03-24 VITALS — BP 112/72 | HR 82 | Ht 62.0 in | Wt 175.2 lb

## 2022-03-24 DIAGNOSIS — R600 Localized edema: Secondary | ICD-10-CM

## 2022-03-24 DIAGNOSIS — I2510 Atherosclerotic heart disease of native coronary artery without angina pectoris: Secondary | ICD-10-CM

## 2022-03-24 MED ORDER — FUROSEMIDE 20 MG PO TABS: 20.0000 mg | ORAL_TABLET | Freq: Every day | ORAL | 0 refills | Status: DC

## 2022-03-24 NOTE — Progress Notes (Signed)
Cardiology Office Note:    Date:  03/24/2022   ID:  Nicole Bass, DOB 1950/09/15, MRN [id number]  PCP:  [name], MD   [facility] Cardiologist:  Ida Rogue, MD     Referring MD: [name], MD   Chief Complaint  Patient presents with   Weight gain & swelling     Patient c/o hands and feet swelling, has bilateral LE edema; was told to stop the Crestor on Tuesday, Aug. 8th and has shortness of breath. Medications reviewed by the patient verbally.     History of Present Illness:    Nicole Bass is a 71 y.o. female with a hx of coronary artery calcification, hypertension, rheumatoid arthritis, OSA on CPAP presents due to leg edema.  She was recently started on Crestor due to coronary artery calcifications about 2 weeks ago.  She took Crestor 20 mg for about 1 week, states having leg crampiness, called the office was advised to decrease Crestor to 10 mg daily.  She later on developed leg edema and leg tenderness, stop Crestor 2 days ago.  States being her regular self 3 weeks ago with no swelling prior to starting Crestor.  Past Medical History:  Diagnosis Date   Anemia    iron everyday, 3 iron infusions last one August 2020   Anxiety    nervous   Arthritis    rheumatoid   Collagen vascular disease ([place])    Crohn disease ([place])    Crohn's disease ([place])    Crohn's disease ([place])    DDD (degenerative disc disease), cervical    DDD (degenerative disc disease), cervical    DDD (degenerative disc disease), cervical 2003   DDD (degenerative disc disease), cervical    DDD (degenerative disc disease), lumbar    Degenerative disc disease, cervical    Depression    Diarrhea    Dyspnea    out of shape   Fatty liver    GERD (gastroesophageal reflux disease)    Hand weakness    left hand worse, small tremors   Headache    with accident   History of chicken pox    History of claustrophobia    History of kidney stones    History of  shingles    HOH (hard of hearing)    left ear   Immune deficiency disorder ([place])    Lymphatic disorder    Meniere's disease    Motion sickness    Nasal fracture 03/27/2020   due to fall   Neuromuscular disorder (HCC)    Neuropathy    legs   Peripheral neuropathy    Pyelonephritis    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[place])    UTI (urinary tract infection)    HO   Vertigo    can be accompanied by nausea   Wears dentures    upper dentures    Past Surgical History:  Procedure Laterality Date   ABDOMINAL HYSTERECTOMY     APPENDECTOMY     BACK SURGERY     x 3   CERVICAL SPINE SURGERY     metal plate   CESAREAN SECTION  1976 & 1989   X 2   CHOLECYSTECTOMY     CLOSED REDUCTION NASAL FRACTURE N/A 04/09/2020   Procedure: CLOSED REDUCTION NASAL FRACTURE;  Surgeon: Margaretha Sheffield, MD;  Location: [facility];  Service: ENT;  Laterality: N/A;   [place]   removed 18 inches of colon,removed appendix   COLONOSCOPY  COLONOSCOPY WITH PROPOFOL N/A 08/02/2021   Procedure: COLONOSCOPY WITH PROPOFOL;  Surgeon: Lesly Rubenstein, MD;  Location: [facility];  Service: Endoscopy;  Laterality: N/A;   dental implants     3 teeth/ wears dentures   DILATION AND CURETTAGE OF UTERUS  2004   ELBOW FRACTURE SURGERY Left 2008   FIBEROPTIC BRONCHOSCOPY N/A 06/01/2020   Procedure: BEDSIDE BRONCHOSCOPY FIBEROPTIC;  Surgeon: Ottie Glazier, MD;  Location: [facility];  Service: Thoracic;  Laterality: N/A;   FRACTURE SURGERY     JOINT REPLACEMENT Right 2015   partial knee replacement   KYPHOPLASTY     KYPHOPLASTY N/A 04/14/2015   Procedure: KYPHOPLASTY;  Surgeon: Hessie Knows, MD;  Location: [facility];  Service: Orthopedics;  Laterality: N/A;   KYPHOPLASTY N/A 06/04/2015   Procedure: KYPHOPLASTY L-5;  Surgeon: Hessie Knows, MD;  Location: [facility];  Service: Orthopedics;  Laterality: N/A;   KYPHOPLASTY     LUMBAR LAMINECTOMY FOR EPIDURAL ABSCESS N/A 10/12/2020   Procedure: SYNOVIAL CYST  RESECTION;  Surgeon: Deetta Perla, MD;  Location: [facility];  Service: Neurosurgery;  Laterality: N/A;   MAXIMUM ACCESS (MAS) TRANSFORAMINAL LUMBAR INTERBODY FUSION (TLIF) 1 LEVEL Left 10/12/2020   Procedure: OPEN L5/S1 TRANSFORAMINAL LUMBAR INTERBODY FUSION (TLIF) 1 LEVEL;  Surgeon: Deetta Perla, MD;  Location: [facility];  Service: Neurosurgery;  Laterality: Left;   MEDIAL PARTIAL KNEE REPLACEMENT Right 2015   NECK/PLATE  8115   OOPHORECTOMY Bilateral    ORIF NASAL FRACTURE N/A 04/09/2020   Procedure: OPEN REDUCTION INTERNAL FIXATION (ORIF) NASAL FRACTURE;  Surgeon: Margaretha Sheffield, MD;  Location: [facility];  Service: ENT;  Laterality: N/A;   REPAIR EXTENSOR TENDON Left 01/08/2019   Procedure: Realignment  EXTENSOR TENDON;  Surgeon: Hessie Knows, MD;  Location: [facility];  Service: Orthopedics;  Laterality: Left;   REVERSE SHOULDER ARTHROPLASTY Right 04/18/2019   Procedure: REVERSE SHOULDER ARTHROPLASTY;  Surgeon: Corky Mull, MD;  Location: [facility];  Service: Orthopedics;  Laterality: Right;   SHOULDER ARTHROSCOPY WITH ROTATOR CUFF REPAIR AND SUBACROMIAL DECOMPRESSION Right 04/13/2016   Procedure: SHOULDER ARTHROSCOPY WITH ROTATOR CUFF REPAIR AND SUBACROMIAL DECOMPRESSION, release of long head biceps tendon;  Surgeon: Leanor Kail, MD;  Location: [facility];  Service: Orthopedics;  Laterality: Right;   TOE SURGERY Right 2013   TONSILLECTOMY  1995   UPPER GI ENDOSCOPY     WRIST FRACTURE SURGERY Bilateral     Current Medications: Current Meds  Medication Sig   acetaminophen (TYLENOL) 650 MG CR tablet Take by mouth.   alendronate (FOSAMAX) 70 MG tablet Take 70 mg by mouth every Monday. Take with a full glass of water on an empty stomach.   aspirin EC 81 MG tablet Take 81 mg by mouth daily. Swallow whole.   cetirizine (ZYRTEC) 10 MG tablet Take 10 mg by mouth daily.   chlorhexidine (PERIDEX) 0.12 % solution Use as directed 15 mLs in the mouth or throat as needed (mouth sores).    cholecalciferol (VITAMIN D3) 25 MCG (1000 UT) tablet Take 1,000 Units by mouth daily.   cyanocobalamin (,VITAMIN B-12,) 1000 MCG/ML injection Inject 1,000 mcg into the muscle every 30 (thirty) days.   diazepam (VALIUM) 10 MG tablet Take 1 tablet (10 mg total) by mouth daily as needed (vaginal pain with intercourse, use before sexual activity per vagina).   furosemide (LASIX) 20 MG tablet Take 1 tablet (20 mg total) by mouth daily.   gabapentin (NEURONTIN) 100 MG capsule Take 100 mg by mouth every morning.  gabapentin (NEURONTIN) 400 MG capsule Take 400 mg by mouth at bedtime.   lidocaine (XYLOCAINE) 2 % solution Use as directed 15 mLs in the mouth or throat as needed for mouth pain.   magnesium oxide (MAG-OX) 400 MG tablet Take 400 mg by mouth every Monday, Wednesday, and Friday.   Multiple Vitamin (MULTIVITAMIN WITH MINERALS) TABS tablet Take 1 tablet by mouth daily.   omeprazole (PRILOSEC) 40 MG capsule Take 40 mg by mouth daily before breakfast.   potassium chloride SA (KLOR-CON M) 20 MEQ tablet Take by mouth.   predniSONE (DELTASONE) 1 MG tablet Take 3 mg by mouth daily with breakfast.    Probiotic Product (PROBIOTIC PO) Take 1 capsule by mouth daily.    rOPINIRole (REQUIP) 1 MG tablet Take 1 mg by mouth at bedtime as needed (for restless legs).    sucralfate (CARAFATE) 1 g tablet Take 0.5 g by mouth 3 (three) times daily with meals as needed (stomach cramping/Crohn's symptoms).   Tofacitinib Citrate (XELJANZ) 5 MG TABS Take 1 tablet by mouth 2 (two) times daily.   traMADol (ULTRAM) 50 MG tablet Take 50 mg by mouth 2 (two) times daily as needed.     Allergies:   Ciprofloxacin, Levofloxacin, Methotrexate, Methotrexate derivatives, Other, Sulfa antibiotics, Crestor [rosuvastatin], Trelegy ellipta [fluticasone-umeclidin-vilant], Cefazolin, Cefprozil, Cephalosporins, Doxycycline, Enbrel [etanercept], Humira [adalimumab], Hydrocodone-acetaminophen, Lorabid [loracarbef], Meropenem, Oxycodone,  Oxycodone-acetaminophen, and Primidone   Social History   Socioeconomic History   Marital status: Married    Spouse name: Not on file   Number of children: Not on file   Years of education: Not on file   Highest education level: Not on file  Occupational History   Not on file  Tobacco Use   Smoking status: Never   Smokeless tobacco: Never  Vaping Use   Vaping Use: Never used  Substance and Sexual Activity   Alcohol use: No   Drug use: No   Sexual activity: Yes    Birth control/protection: Surgical  Other Topics Concern   Not on file  Social History Narrative   Not on file   Social Determinants of Health   Financial Resource Strain: Unknown (08/23/2018)   Overall Financial Resource Strain (CARDIA)    Difficulty of Paying Living Expenses: Patient refused  Food Insecurity: Unknown (08/23/2018)   Hunger Vital Sign    Worried About Running Out of Food in the Last Year: Patient refused    [place] in the Last Year: Patient refused  Transportation Needs: Unknown (08/23/2018)   PRAPARE - Transportation    Lack of Transportation (Medical): Patient refused    Lack of Transportation (Non-Medical): Patient refused  Physical Activity: Unknown (08/23/2018)   Exercise Vital Sign    Days of Exercise per Week: Patient refused    Minutes of Exercise per Session: Patient refused  Stress: Unknown (08/23/2018)   [place]    Feeling of Stress : Patient refused  Social Connections: Unknown (08/23/2018)   Social Connection and Isolation Panel [NHANES]    Frequency of Communication with Friends and Family: Patient refused    Frequency of Social Gatherings with Friends and Family: Patient refused    Attends Religious Services: Patient refused    Marine scientist or Organizations: Patient refused    Attends Archivist Meetings: Patient refused    Marital Status: Patient refused     Family History: The  patient's family history includes Colon cancer in her maternal grandfather;  Diabetes in her mother; Emphysema in her father and mother. There is no history of Breast cancer or Ovarian cancer.  ROS:   Please see the history of present illness.     All other systems reviewed and are negative.  EKGs/Labs/Other Studies Reviewed:    The following studies were reviewed today:   EKG:  EKG is  ordered today.  The ekg ordered today demonstrates normal sinus rhythm, normal ECG  Recent Labs: 09/30/2021: Creatinine, Ser 1.00  Recent Lipid Panel No results found for: "CHOL", "TRIG", "HDL", "CHOLHDL", "VLDL", "LDLCALC", "LDLDIRECT"   Risk Assessment/Calculations:        Physical Exam:    VS:  BP 112/72 (BP Location: Left Arm, Patient Position: Sitting, Cuff Size: Normal)   Pulse 82   Ht 5' 2"  (1.575 m)   Wt 175 lb 4 oz (79.5 kg)   SpO2 98%   BMI 32.05 kg/m     Wt Readings from Last 3 Encounters:  03/24/22 175 lb 4 oz (79.5 kg)  03/17/22 170 lb (77.1 kg)  01/31/22 173 lb 6 oz (78.6 kg)     GEN:  Well nourished, well developed in no acute distress HEENT: Normal NECK: No JVD; No carotid bruits CARDIAC: RRR, no murmurs, rubs, gallops RESPIRATORY:  Clear to auscultation without rales, wheezing or rhonchi  ABDOMEN: Soft, non-tender, non-distended MUSCULOSKELETAL: 1-2+ edema, varicose veins SKIN: Warm and dry NEUROLOGIC:  Alert and oriented x 3 PSYCHIATRIC:  Normal affect   ASSESSMENT:    1. Bilateral leg edema   2. Coronary artery disease involving native coronary artery of native heart without angina pectoris    PLAN:    In order of problems listed above:  Bilateral leg edema after starting statin.  Varicose veins noted in lower extremities, this could also be contributing.  Stop statin, start Lasix 20 mg daily. hopefully symptoms improve with stopping statin and starting diuretics.  Check BMP in 5 days. CAD, continue aspirin 81 mg, consider Zetia at follow-up visit .  Will not  want to introduce a new medication at this time until patient is asymptomatic.  Follow-up with Dr. Rockey Situ or APP in about 2 -3 weeks      Medication Adjustments/Labs and Tests Ordered: Current medicines are reviewed at length with the patient today.  Concerns regarding medicines are outlined above.  Orders Placed This Encounter  Procedures   Basic Metabolic Panel (BMET)   EKG 12-Lead   Meds ordered this encounter  Medications   furosemide (LASIX) 20 MG tablet    Sig: Take 1 tablet (20 mg total) by mouth daily.    Dispense:  30 tablet    Refill:  0    Patient Instructions  Medication Instructions:   Your physician has recommended you make the following change in your medication:   START Lasix (furosemide) 20 mg daily   *If you need a refill on your cardiac medications before your next appointment, please call your pharmacy*   Lab Work:  Your physician recommends that you return for lab work in 5 days (03/29/22): - BMET (This lab is NOT fasting)  -  Please go to the [place] [facility] [place] in at the Registration Desk: 1st desk to the right, past the screening table -  Valet Parking is offered if needed -  No appointment needed -  Lab hours: Monday- Friday (7:30 am- 5:30 pm)    Testing/Procedures:  None ordered   Follow-Up: At [facility], you and your health  needs are our priority.  As part of our continuing mission to provide you with exceptional heart care, we have created designated Provider Care Teams.  These Care Teams include your primary Cardiologist (physician) and Advanced Practice Providers (APPs -  Physician Assistants and Nurse Practitioners) who all work together to provide you with the care you need, when you need it.  We recommend signing up for the patient portal called "MyChart".  Sign up information is provided on this After Visit Summary.  MyChart is used to connect with patients for Virtual Visits (Telemedicine).  Patients are able  to view lab/test results, encounter notes, upcoming appointments, etc.  Non-urgent messages can be sent to your provider as well.   To learn more about what you can do with MyChart, go to [email or web address].    Your next appointment:   2 - 3 week(s)  The format for your next appointment:   In Person  Provider:   You may see Ida Rogue, MD or one of the following Advanced Practice Providers on your designated Care Team:   Murray Hodgkins, NP Christell Faith, PA-C Cadence Kathlen Mody, [place]   Important Information About Sugar         Signed, Kate Sable, MD  03/24/2022 12:13 PM    [place]

## 2022-03-24 NOTE — Patient Instructions (Signed)
Medication Instructions:   Your physician has recommended you make the following change in your medication:   START Lasix (furosemide) 20 mg daily   *If you need a refill on your cardiac medications before your next appointment, please call your pharmacy*   Lab Work:  Your physician recommends that you return for lab work in 5 days (03/29/22): - BMET (This lab is NOT fasting)  -  Please go to the [place] at [place] in at the Registration Desk: 1st desk to the right, past the screening table -  Valet Parking is offered if needed -  No appointment needed -  Lab hours: Monday- Friday (7:30 am- 5:30 pm)    Testing/Procedures:  None ordered   Follow-Up: At [facility], you and your health needs are our priority.  As part of our continuing mission to provide you with exceptional heart care, we have created designated Provider Care Teams.  These Care Teams include your primary Cardiologist (physician) and Advanced Practice Providers (APPs -  Physician Assistants and Nurse Practitioners) who all work together to provide you with the care you need, when you need it.  We recommend signing up for the patient portal called "MyChart".  Sign up information is provided on this After Visit Summary.  MyChart is used to connect with patients for Virtual Visits (Telemedicine).  Patients are able to view lab/test results, encounter notes, upcoming appointments, etc.  Non-urgent messages can be sent to your provider as well.   To learn more about what you can do with MyChart, go to [email or web address].    Your next appointment:   2 - 3 week(s)  The format for your next appointment:   In Person  Provider:   You may see Ida Rogue, MD or one of the following Advanced Practice Providers on your designated Care Team:   Murray Hodgkins, NP Christell Faith, PA-C Cadence Kathlen Mody, [place]   Important Information About Sugar

## 2022-04-14 ENCOUNTER — Other Ambulatory Visit (INDEPENDENT_AMBULATORY_CARE_PROVIDER_SITE_OTHER): Payer: Self-pay | Admitting: Nurse Practitioner

## 2022-04-14 DIAGNOSIS — I8312 Varicose veins of left lower extremity with inflammation: Secondary | ICD-10-CM

## 2022-04-14 DIAGNOSIS — I8311 Varicose veins of right lower extremity with inflammation: Secondary | ICD-10-CM

## 2022-04-19 ENCOUNTER — Ambulatory Visit (INDEPENDENT_AMBULATORY_CARE_PROVIDER_SITE_OTHER): Payer: Medicare Other | Admitting: Vascular Surgery

## 2022-04-19 ENCOUNTER — Encounter (INDEPENDENT_AMBULATORY_CARE_PROVIDER_SITE_OTHER): Payer: Self-pay | Admitting: Vascular Surgery

## 2022-04-19 ENCOUNTER — Ambulatory Visit (INDEPENDENT_AMBULATORY_CARE_PROVIDER_SITE_OTHER): Payer: Medicare Other

## 2022-04-19 VITALS — BP 121/71 | HR 88 | Resp 16 | Wt 175.8 lb

## 2022-04-19 DIAGNOSIS — I890 Lymphedema, not elsewhere classified: Secondary | ICD-10-CM | POA: Insufficient documentation

## 2022-04-19 DIAGNOSIS — E1140 Type 2 diabetes mellitus with diabetic neuropathy, unspecified: Secondary | ICD-10-CM

## 2022-04-19 DIAGNOSIS — I8311 Varicose veins of right lower extremity with inflammation: Secondary | ICD-10-CM | POA: Diagnosis not present

## 2022-04-19 DIAGNOSIS — I8312 Varicose veins of left lower extremity with inflammation: Secondary | ICD-10-CM

## 2022-04-19 DIAGNOSIS — I83812 Varicose veins of left lower extremities with pain: Secondary | ICD-10-CM

## 2022-04-19 DIAGNOSIS — I10 Essential (primary) hypertension: Secondary | ICD-10-CM | POA: Diagnosis not present

## 2022-04-19 DIAGNOSIS — I83813 Varicose veins of bilateral lower extremities with pain: Secondary | ICD-10-CM | POA: Diagnosis not present

## 2022-04-19 NOTE — Assessment & Plan Note (Signed)
Patient is clearly developed lymphedema from chronic scarring and lymphatic channels with chronic swelling.  She now has some skin thickening and swelling refractory to compression and elevation, and would benefit from a lymphedema pump.  We are also going to performing venous ablation on her right leg and hopefully that will help the swelling some.  She will continue compression socks, elevation, and staying active.

## 2022-04-19 NOTE — Assessment & Plan Note (Signed)
She has undergone laser ablation of the left great saphenous vein with good results but still has painful varicosities and swelling of the left lower extremity.  We were not able to do the sclerotherapy treatments that were planned secondary to several major issues and surgeries required for her eyes delaying things.  This would still be an option going forward. For the right leg, she had long segment great saphenous vein reflux including the saphenofemoral junction on duplex today.  Laser ablation of this leg would be a reasonable option to treat her venous insufficiency.  Risks and benefits are discussed and she is agreeable to proceed.

## 2022-04-19 NOTE — Progress Notes (Signed)
Cardiology Clinic Note   Patient Name: Nicole Bass Date of Encounter: 04/21/2022  Primary Care Provider:  [name], MD Primary Cardiologist:  Ida Rogue, MD  Patient Profile    71 year old female with a history of coronary artery calcification, essential hypertension, rheumatoid arthritis, obstructive sleep apnea on CPAP, venous insufficiency status post ablation, who presents today to follow-up with shortness of breath and peripheral edema.  Past Medical History    Past Medical History:  Diagnosis Date   Anemia    iron everyday, 3 iron infusions last one August 2020   Anxiety    nervous   Arthritis    rheumatoid   Collagen vascular disease ([place])    Crohn disease ([place])    Crohn's disease ([place])    Crohn's disease ([place])    DDD (degenerative disc disease), cervical    DDD (degenerative disc disease), cervical    DDD (degenerative disc disease), cervical 2003   DDD (degenerative disc disease), cervical    DDD (degenerative disc disease), lumbar    Degenerative disc disease, cervical    Depression    Diarrhea    Dyspnea    out of shape   Fatty liver    GERD (gastroesophageal reflux disease)    Hand weakness    left hand worse, small tremors   Headache    with accident   History of chicken pox    History of claustrophobia    History of kidney stones    History of shingles    HOH (hard of hearing)    left ear   Immune deficiency disorder ([place])    Lymphatic disorder    Meniere's disease    Motion sickness    Nasal fracture 03/27/2020   due to fall   Neuromuscular disorder (HCC)    Neuropathy    legs   Peripheral neuropathy    Pyelonephritis    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[place])    UTI (urinary tract infection)    HO   Vertigo    can be accompanied by nausea   Wears dentures    upper dentures   Past Surgical History:  Procedure Laterality Date   ABDOMINAL HYSTERECTOMY     APPENDECTOMY     BACK SURGERY     x 3   CERVICAL SPINE SURGERY      metal plate   [place]   X 2   CHOLECYSTECTOMY     CLOSED REDUCTION NASAL FRACTURE N/A 04/09/2020   Procedure: CLOSED REDUCTION NASAL FRACTURE;  Surgeon: Margaretha Sheffield, MD;  Location: [place];  Service: ENT;  Laterality: N/A;   [place]   removed 18 inches of colon,removed appendix   COLONOSCOPY     COLONOSCOPY WITH PROPOFOL N/A 08/02/2021   Procedure: COLONOSCOPY WITH PROPOFOL;  Surgeon: Lesly Rubenstein, MD;  Location: [facility];  Service: Endoscopy;  Laterality: N/A;   dental implants     3 teeth/ wears dentures   DILATION AND CURETTAGE OF UTERUS  2004   ELBOW FRACTURE SURGERY Left 2008   FIBEROPTIC BRONCHOSCOPY N/A 06/01/2020   Procedure: BEDSIDE BRONCHOSCOPY FIBEROPTIC;  Surgeon: Ottie Glazier, MD;  Location: [facility];  Service: Thoracic;  Laterality: N/A;   FRACTURE SURGERY     JOINT REPLACEMENT Right 2015   partial knee replacement   KYPHOPLASTY     KYPHOPLASTY N/A 04/14/2015   Procedure: KYPHOPLASTY;  Surgeon: Hessie Knows, MD;  Location: [facility];  Service: Orthopedics;  Laterality: N/A;   KYPHOPLASTY N/A 06/04/2015   Procedure: KYPHOPLASTY L-5;  Surgeon: Hessie Knows, MD;  Location: [facility];  Service: Orthopedics;  Laterality: N/A;   KYPHOPLASTY     LUMBAR LAMINECTOMY FOR EPIDURAL ABSCESS N/A 10/12/2020   Procedure: SYNOVIAL CYST RESECTION;  Surgeon: Deetta Perla, MD;  Location: [facility];  Service: Neurosurgery;  Laterality: N/A;   MAXIMUM ACCESS (MAS) TRANSFORAMINAL LUMBAR INTERBODY FUSION (TLIF) 1 LEVEL Left 10/12/2020   Procedure: OPEN L5/S1 TRANSFORAMINAL LUMBAR INTERBODY FUSION (TLIF) 1 LEVEL;  Surgeon: Deetta Perla, MD;  Location: [facility];  Service: Neurosurgery;  Laterality: Left;   MEDIAL PARTIAL KNEE REPLACEMENT Right 2015   NECK/PLATE  3557   OOPHORECTOMY Bilateral    ORIF NASAL FRACTURE N/A 04/09/2020   Procedure: OPEN REDUCTION INTERNAL FIXATION (ORIF) NASAL FRACTURE;  Surgeon: Margaretha Sheffield, MD;  Location:  [place];  Service: ENT;  Laterality: N/A;   REPAIR EXTENSOR TENDON Left 01/08/2019   Procedure: Realignment  EXTENSOR TENDON;  Surgeon: Hessie Knows, MD;  Location: [facility];  Service: Orthopedics;  Laterality: Left;   REVERSE SHOULDER ARTHROPLASTY Right 04/18/2019   Procedure: REVERSE SHOULDER ARTHROPLASTY;  Surgeon: Corky Mull, MD;  Location: [facility];  Service: Orthopedics;  Laterality: Right;   SHOULDER ARTHROSCOPY WITH ROTATOR CUFF REPAIR AND SUBACROMIAL DECOMPRESSION Right 04/13/2016   Procedure: SHOULDER ARTHROSCOPY WITH ROTATOR CUFF REPAIR AND SUBACROMIAL DECOMPRESSION, release of long head biceps tendon;  Surgeon: Leanor Kail, MD;  Location: [facility];  Service: Orthopedics;  Laterality: Right;   TOE SURGERY Right 2013   TONSILLECTOMY  1995   UPPER GI ENDOSCOPY     WRIST FRACTURE SURGERY Bilateral     Allergies  Allergies  Allergen Reactions   Ciprofloxacin Itching and Rash   Levofloxacin Hives and Rash   Methotrexate     Other reaction(s): Other (See Comments) Mouth Ulcers/[place] Mouth and throat ulcers   Methotrexate Derivatives     Mouth and throat ulcers   Other Other (See Comments) and Rash    Other reaction(s): Other (See Comments), Unknown Per pts MD she is unable to take this medication because it is contraindicated with her other medications.   Per pts MD she is unable to take this medication because it is contraindicated with her other medications.   Other reaction(s): Other (See Comments), Unknown Other reaction(s): Other (See Comments), Unknown Per pts MD she is unable to take this medication because it is contraindicated with her other medications.   Per pts MD she is unable to take this medication because it is contraindicated with her other medications.   Other reaction(s): Other (See Comments), Unknown Other reaction(s): Other (See Comments), Unknown Per pts MD she is unable to take this medication because it is contraindicated with her  Per  pts MD she is unable to take this medication because it is contraindicated with her other medications.   Other reaction(s): Other (See Comments) Other reaction(s): Other (See Comments), Unknown Per pts MD she is unable to take this medication because it is contraindicated with her other medications.   Per pts MD she is unable to take this medication because it is contraindicated with her other medications.   Other reaction(s): Other (See Comments), Unknown Other reaction(s): Other (See Comments), Unknown Per pts MD she is unable to take this medication because it is contraindicated with her other medications.   Per pts MD she is unable to take this medication because it is contraindicated with her other medications.   Other reaction(s): Other (  See Comments), Unknown Other reaction(s): Other (See Comments), Unknown Per pts MD she is unable to take this medication because it is contraindicated with her  Per pts MD she is unable to take this medication because it is contraindicated with her other medications.     Sulfa Antibiotics Other (See Comments)    Per pts MD she is unable to take this medication because it is contraindicated with her other medications.     Crestor [Rosuvastatin] Swelling   Trelegy Ellipta [Fluticasone-Umeclidin-Vilant]     Laryngitis    Cefazolin Itching and Rash   Cefprozil Rash   Cephalosporins Itching and Rash   Doxycycline Rash    Pt tolerates Z pack and amoxicillin   Enbrel [Etanercept] Itching and Rash   Humira [Adalimumab] Itching and Rash   Hydrocodone-Acetaminophen Itching and Other (See Comments)    Itching only   Lorabid [Loracarbef] Itching and Rash   Meropenem Itching and Rash   Oxycodone Itching, Other (See Comments) and Rash    Other reaction(s): Other (See Comments)   Oxycodone-Acetaminophen Itching   Primidone Itching and Rash    History of Present Illness    71 year old female with the previously mentioned past medical history of coronary  artery calcification, essential hypertension, rheumatoid arthritis, obstructive sleep apnea on CPAP, insufficiency, with recent complaints of shortness of breath and peripheral edema.  Previous studies include a CT of the chest for evaluation of sternal pain after status post mechanical fall which showed left anterior descending, circumflex and right coronary artery atherosclerotic calcifications, and punctuated classified granuloma in the right middle lobe, left upper lobe tiny punctuate calcified granulomas present and suspected distal bronchiolectasis with mild airway plugging in the left lower lobe.  She also underwent Lexiscan Myoview on 07/04/2020 which revealed LVEF 75%.  SPECT imaging revealed no evidence of scar or ischemia.  PFTs were also completed and revealed abnormal results.  She was admitted to [facility] from 328-11/12/2020 for syncope following a recent back surgery.  Potassium was noted at 2.8, magnesium 1.6, D-dimer was elevated, CTA negative for PE.  CT of the chest did show prior granulomatous disease, pulmonary nodular changes in the right upper lobe consistent with prior fungal infection.  30-day monitor placed as outpatient revealed predominant sinus rhythm with mean heart rate of 92 bpm, sinus tachycardia was observed, there were 14 beat ventricular runs, and premature atrial contractions.  She remained asymptomatic while wearing the monitor.  She was last seen in clinic 03/17/2022 by Dr. Garen Lah for shortness of breath and worsening peripheral edema.  She stated that her edema worsened when she had been started on statin therapy with rosuvastatin.  She had also had leg cramps with the 20 mg daily dose and had called the office and her dose was decreased to 10 mg.  Her symptoms slightly improved but then when the swelling started she had stopped the Crestor altogether at that point time.  During her visit she was advised to continue to stay off her statin therapy at that time until her  symptoms have resolved and was started on Lasix 20 mg daily with hopes of decreasing the amount of peripheral edema she was suffering from.  She was also advised at that time she would need repeat blood work done in 5 days after starting the Lasix.  She did have a CMP completed on 03/30/2022 which revealed a sodium of 140, potassium 3.7, chloride of 102, BUN 14, creatinine 0.8, with a GFR of 71.  She returns clinic today  for follow-up with continued concerns of shortness of breath that has slightly worsening over the last 6 months. She also continues to have swelling to her bilateral lower extremities. She recently has seen Dr. Lucky Cowboy and was advised she will need to have venous ablation completed on the right leg as she has already undergone therapy on the left leg. He also discussed the need to lymphedema pumps which she is very willing to try.  She has been continued on lasix and states that the medication has been working well and has helped the swelling some. Remains off of statin therapy currently, as previously she was having muscle aches and swelling thought to be related to the statin.   Home Medications    Current Outpatient Medications  Medication Sig Dispense Refill   acetaminophen (TYLENOL) 650 MG CR tablet Take by mouth.     alendronate (FOSAMAX) 70 MG tablet Take 70 mg by mouth every Monday. Take with a full glass of water on an empty stomach.     aspirin EC 81 MG tablet Take 81 mg by mouth daily. Swallow whole.     cetirizine (ZYRTEC) 10 MG tablet Take 10 mg by mouth daily.     chlorhexidine (PERIDEX) 0.12 % solution Use as directed 15 mLs in the mouth or throat as needed (mouth sores).     cholecalciferol (VITAMIN D3) 25 MCG (1000 UT) tablet Take 1,000 Units by mouth daily.     cyanocobalamin (,VITAMIN B-12,) 1000 MCG/ML injection Inject 1,000 mcg into the muscle every 30 (thirty) days.     diazepam (VALIUM) 10 MG tablet Take 1 tablet (10 mg total) by mouth daily as needed (vaginal pain  with intercourse, use before sexual activity per vagina). 30 tablet 0   furosemide (LASIX) 20 MG tablet Take 1 tablet (20 mg total) by mouth daily. 30 tablet 0   gabapentin (NEURONTIN) 100 MG capsule Take 100 mg by mouth every morning.     gabapentin (NEURONTIN) 400 MG capsule Take 400 mg by mouth at bedtime.     lidocaine (XYLOCAINE) 2 % solution Use as directed 15 mLs in the mouth or throat as needed for mouth pain. 100 mL 1   magnesium oxide (MAG-OX) 400 MG tablet Take 400 mg by mouth every Monday, Wednesday, and Friday.     Multiple Vitamin (MULTIVITAMIN WITH MINERALS) TABS tablet Take 1 tablet by mouth daily.     omeprazole (PRILOSEC) 40 MG capsule Take 40 mg by mouth daily before breakfast.     potassium chloride SA (KLOR-CON M) 20 MEQ tablet Take by mouth.     predniSONE (DELTASONE) 1 MG tablet Take 3 mg by mouth daily with breakfast.      Probiotic Product (PROBIOTIC PO) Take 1 capsule by mouth daily. Culturelle Probiotic     RESTASIS 0.05 % ophthalmic emulsion 1 drop 2 (two) times daily.     rOPINIRole (REQUIP) 1 MG tablet Take 1 mg by mouth at bedtime as needed (for restless legs).      sucralfate (CARAFATE) 1 g tablet Take 0.5 g by mouth 3 (three) times daily with meals as needed (stomach cramping/Crohn's symptoms).     Tofacitinib Citrate (XELJANZ) 5 MG TABS Take 1 tablet by mouth 2 (two) times daily.     traMADol (ULTRAM) 50 MG tablet Take 50 mg by mouth 2 (two) times daily as needed.     ALPRAZolam (XANAX) 0.5 MG tablet Take one tab 1 hour before scheduled procedure and one tab when you arrive  in office (Patient not taking: Reported on 03/24/2022) 2 tablet 0   betamethasone dipropionate 0.05 % lotion Apply topically 2 (two) times daily as needed (Rash). Apply thin coat to affected areas twice daily for 2 weeks (Patient not taking: Reported on 03/24/2022) 60 mL 0   clindamycin (CLEOCIN T) 1 % external solution Apply to sores on scalp QD PRN. (Patient not taking: Reported on 03/24/2022) 60  mL 3   clindamycin (CLEOCIN) 300 MG capsule Take 300 mg by mouth 3 (three) times daily. (Patient not taking: Reported on 03/24/2022)     estradiol (ESTRACE VAGINAL) 0.1 MG/GM vaginal cream Apply a pea size amount 3 nights a week (Patient not taking: Reported on 03/24/2022) 42.5 g 12   ketoconazole (NIZORAL) 2 % shampoo Shampoo into the scalp let sit 5-10 minutes then wash out. Use twice per week. (Patient not taking: Reported on 03/24/2022) 120 mL 5   valACYclovir (VALTREX) 500 MG tablet Take 1 tablet twice daily for 5 days for flares. Start at first sign of symptoms. (Patient not taking: Reported on 03/24/2022) 30 tablet 5   No current facility-administered medications for this visit.     Family History    Family History  Problem Relation Age of Onset   Diabetes Mother    Emphysema Mother    Emphysema Father    Colon cancer Maternal Grandfather    Breast cancer Neg Hx    Ovarian cancer Neg Hx    She indicated that her mother is deceased. She indicated that her father is deceased. She indicated that the status of her maternal grandfather is unknown. She indicated that the status of her neg hx is unknown.  Social History    Social History   Socioeconomic History   Marital status: Married    Spouse name: Not on file   Number of children: Not on file   Years of education: Not on file   Highest education level: Not on file  Occupational History   Not on file  Tobacco Use   Smoking status: Never   Smokeless tobacco: Never  Vaping Use   Vaping Use: Never used  Substance and Sexual Activity   Alcohol use: No   Drug use: No   Sexual activity: Yes    Birth control/protection: Surgical  Other Topics Concern   Not on file  Social History Narrative   Not on file   Social Determinants of Health   Financial Resource Strain: Unknown (08/23/2018)   Overall Financial Resource Strain (CARDIA)    Difficulty of Paying Living Expenses: Patient refused  Food Insecurity: Unknown (08/23/2018)    Hunger Vital Sign    Worried About Running Out of Food in the Last Year: Patient refused    [place] in the Last Year: Patient refused  Transportation Needs: Unknown (08/23/2018)   PRAPARE - Transportation    Lack of Transportation (Medical): Patient refused    Lack of Transportation (Non-Medical): Patient refused  Physical Activity: Unknown (08/23/2018)   Exercise Vital Sign    Days of Exercise per Week: Patient refused    Minutes of Exercise per Session: Patient refused  Stress: Unknown (08/23/2018)   [place]    Feeling of Stress : Patient refused  Social Connections: Unknown (08/23/2018)   Social Connection and Isolation Panel [NHANES]    Frequency of Communication with Friends and Family: Patient refused    Frequency of Social Gatherings with Friends and Family: Patient refused  Attends Religious Services: Patient refused    Active Member of Clubs or Organizations: Patient refused    Attends Archivist Meetings: Patient refused    Marital Status: Patient refused  Intimate Partner Violence: Unknown (08/23/2018)   Humiliation, Afraid, Rape, and Kick questionnaire    Fear of Current or Ex-Partner: Patient refused    Emotionally Abused: Patient refused    Physically Abused: Patient refused    Sexually Abused: Patient refused     Review of Systems    General:  No chills, fever, night sweats or weight changes. Endorses fatigue Cardiovascular:  No chest pain, Endorses dyspnea on exertion, edema, but denies orthopnea, palpitations, paroxysmal nocturnal dyspnea. Dermatological: No rash, lesions/masses Respiratory: No cough, endorses dyspnea Urologic: No hematuria, dysuria Abdominal:   No nausea, vomiting, diarrhea, bright red blood per rectum, melena, or hematemesis Neurologic:  No visual changes, wkns, changes in mental status. All other systems reviewed and are otherwise negative except as noted  above.   Physical Exam    VS:  BP 119/75 (BP Location: Left Arm, Patient Position: Sitting, Cuff Size: Normal)   Pulse 93   Ht 5' 2"  (1.575 m)   Wt 174 lb (78.9 kg)   SpO2 93%   BMI 31.83 kg/m  , BMI Body mass index is 31.83 kg/m.     GEN: Well nourished, well developed, in no acute distress. HEENT: normal. Glasses on Neck: Supple, no JVD, carotid bruits, or masses. Cardiac: RRR, no murmurs, rubs, or gallops. No clubbing, cyanosis, 1+ edema to the bilateral lower extremities.  Varicose veins CEAP III. Radials/DP/PT 2+ and equal bilaterally.  Respiratory:  Respirations regular and unlabored, clear to auscultation bilaterally. GI: Soft, nontender, nondistended, BS + x 4. MS: no deformity or atrophy. ACE wrap to the right ankle. Skin: warm and dry, no rash. Neuro:  Strength and sensation are intact. Psych: Normal affect.  Accessory Clinical Findings    ECG personally reviewed by me today- no new tracing was completed today  Lab Results  Component Value Date   WBC 5.2 11/12/2020   HGB 8.6 (L) 11/12/2020   HCT 27.1 (L) 11/12/2020   MCV 97.8 11/12/2020   PLT 329 11/12/2020   Lab Results  Component Value Date   CREATININE 1.00 09/30/2021   BUN 9 11/12/2020   NA 142 11/12/2020   K 3.9 11/12/2020   CL 106 11/12/2020   CO2 29 11/12/2020   Lab Results  Component Value Date   ALT 12 11/12/2020   AST 26 11/12/2020   ALKPHOS 79 11/12/2020   BILITOT 0.5 11/12/2020   No results found for: "CHOL", "HDL", "LDLCALC", "LDLDIRECT", "TRIG", "CHOLHDL"  No results found for: "HGBA1C"  Assessment & Plan   1.  Shortness of breath that has progressed. Over the last 6 months seems to be worse. Scheduled echocardiogram with the last study being in 10/2020 with LVEF 50-55%, NWMA, and trivial MR. Labs were reviewed and she has long standing history of anemia with last hgb 10.8. Respiratory panel was negative for Flu A&B, RSV, and COVID. She has been continued on furosemide 20 mg daily. There  were no refills that were needed today.   2. Bilateral lower extremity edema with venous insufficiency. She has previously undergone venous ablation to the left and has now been advised by Dr. Lucky Cowboy she needs to have the ablation procedure done to the right as well. Unfortunately, she recently sustained an stress fracture to the right foot. Vascular has recommended lymphedema pumps for her.  She has been advised to continue with conservative therapy of elevating her legs, low sodium, pumps or compression hose. Continue to follow with vascular. Has remained off statin at this time.  3. Coronary artery disease found of CT scan. She denies chest discomfort. Echocardiogram ordered if continued with SOB/DOE may revisit stress testing for anginal equivalent in a diabetic pending symptoms and testing results.. She has been continued on ASA therapy. For now she remains off of statin therapy and is reluctant to start new or additional medications at this point.   4. Hyperlipidemia with LDL 62, 02/22/22. Currently at goal. Did not tolerate crestor 20 mg due to leg pain and swelling, dose reduced without improved, so medication was stopped. Off statin therapy until follow up to determine if symptoms with legs have improved. Followed by her PCP. Recommend total cholesterol 150 or less and LDL 70 of less.   5. Type II Diabetes with A1c 6.5.  Continue to increase activity, dietary modifications which have improved, encourage weight loss. Followed by PCP.  6.Disposition patient to return to clinic to see MD/APP in 3 months or sooner if needed  Pleasant Britz, NP 04/21/2022, 12:22 PM

## 2022-04-19 NOTE — Progress Notes (Signed)
MRN : [id number]  Nicole Bass is a 71 y.o. ([birth year]) female who presents with chief complaint of  Chief Complaint  Patient presents with   Follow-up    Ultrasound follow up  .  History of Present Illness: Patient returns today in follow up of leg pain and swelling and venous insufficiency.  She underwent laser ablation of the left GSV in February of this year. She was slated to have sclerotherapy but this got delayed due to eye issues.  She is also had a fracture and a bad sprain of the right foot and ankle and that leg is bothering her more now.  The swelling has gotten significantly worse.  Both legs are actually swelling.  She has worn her compression socks and elevate her legs, but the swelling is actually getting worse in both legs.  Duplex was done today which showed persistent ablation of the left great saphenous vein without recanalization.  No significant abnormalities were seen on the left.  On the right, the right great saphenous vein demonstrated reflux all the way up to and including the saphenofemoral junction.  Current Outpatient Medications  Medication Sig Dispense Refill   acetaminophen (TYLENOL) 650 MG CR tablet Take by mouth.     alendronate (FOSAMAX) 70 MG tablet Take 70 mg by mouth every Monday. Take with a full glass of water on an empty stomach.     aspirin EC 81 MG tablet Take 81 mg by mouth daily. Swallow whole.     cetirizine (ZYRTEC) 10 MG tablet Take 10 mg by mouth daily.     chlorhexidine (PERIDEX) 0.12 % solution Use as directed 15 mLs in the mouth or throat as needed (mouth sores).     cholecalciferol (VITAMIN D3) 25 MCG (1000 UT) tablet Take 1,000 Units by mouth daily.     cyanocobalamin (,VITAMIN B-12,) 1000 MCG/ML injection Inject 1,000 mcg into the muscle every 30 (thirty) days.     diazepam (VALIUM) 10 MG tablet Take 1 tablet (10 mg total) by mouth daily as needed (vaginal pain with intercourse, use before sexual activity per vagina). 30 tablet 0    furosemide (LASIX) 20 MG tablet Take 1 tablet (20 mg total) by mouth daily. 30 tablet 0   gabapentin (NEURONTIN) 100 MG capsule Take 100 mg by mouth every morning.     gabapentin (NEURONTIN) 400 MG capsule Take 400 mg by mouth at bedtime.     lidocaine (XYLOCAINE) 2 % solution Use as directed 15 mLs in the mouth or throat as needed for mouth pain. 100 mL 1   magnesium oxide (MAG-OX) 400 MG tablet Take 400 mg by mouth every Monday, Wednesday, and Friday.     Multiple Vitamin (MULTIVITAMIN WITH MINERALS) TABS tablet Take 1 tablet by mouth daily.     omeprazole (PRILOSEC) 40 MG capsule Take 40 mg by mouth daily before breakfast.     potassium chloride SA (KLOR-CON M) 20 MEQ tablet Take by mouth.     predniSONE (DELTASONE) 1 MG tablet Take 3 mg by mouth daily with breakfast.      Probiotic Product (PROBIOTIC PO) Take 1 capsule by mouth daily.      RESTASIS 0.05 % ophthalmic emulsion 1 drop 2 (two) times daily.     rOPINIRole (REQUIP) 1 MG tablet Take 1 mg by mouth at bedtime as needed (for restless legs).      sucralfate (CARAFATE) 1 g tablet Take 0.5 g by mouth 3 (three) times daily with meals as  needed (stomach cramping/Crohn's symptoms).     Tofacitinib Citrate (XELJANZ) 5 MG TABS Take 1 tablet by mouth 2 (two) times daily.     traMADol (ULTRAM) 50 MG tablet Take 50 mg by mouth 2 (two) times daily as needed.     ALPRAZolam (XANAX) 0.5 MG tablet Take one tab 1 hour before scheduled procedure and one tab when you arrive in office (Patient not taking: Reported on 03/24/2022) 2 tablet 0   betamethasone dipropionate 0.05 % lotion Apply topically 2 (two) times daily as needed (Rash). Apply thin coat to affected areas twice daily for 2 weeks (Patient not taking: Reported on 03/24/2022) 60 mL 0   clindamycin (CLEOCIN T) 1 % external solution Apply to sores on scalp QD PRN. (Patient not taking: Reported on 03/24/2022) 60 mL 3   clindamycin (CLEOCIN) 300 MG capsule Take 300 mg by mouth 3 (three) times daily.  (Patient not taking: Reported on 03/24/2022)     estradiol (ESTRACE VAGINAL) 0.1 MG/GM vaginal cream Apply a pea size amount 3 nights a week (Patient not taking: Reported on 03/24/2022) 42.5 g 12   ketoconazole (NIZORAL) 2 % shampoo Shampoo into the scalp let sit 5-10 minutes then wash out. Use twice per week. (Patient not taking: Reported on 03/24/2022) 120 mL 5   valACYclovir (VALTREX) 500 MG tablet Take 1 tablet twice daily for 5 days for flares. Start at first sign of symptoms. (Patient not taking: Reported on 03/24/2022) 30 tablet 5   No current facility-administered medications for this visit.    Past Medical History:  Diagnosis Date   Anemia    iron everyday, 3 iron infusions last one August 2020   Anxiety    nervous   Arthritis    rheumatoid   Collagen vascular disease ([place])    Crohn disease ([place])    Crohn's disease ([place])    Crohn's disease ([place])    DDD (degenerative disc disease), cervical    DDD (degenerative disc disease), cervical    DDD (degenerative disc disease), cervical 2003   DDD (degenerative disc disease), cervical    DDD (degenerative disc disease), lumbar    Degenerative disc disease, cervical    Depression    Diarrhea    Dyspnea    out of shape   Fatty liver    GERD (gastroesophageal reflux disease)    Hand weakness    left hand worse, small tremors   Headache    with accident   History of chicken pox    History of claustrophobia    History of kidney stones    History of shingles    HOH (hard of hearing)    left ear   Immune deficiency disorder ([place])    Lymphatic disorder    Meniere's disease    Motion sickness    Nasal fracture 03/27/2020   due to fall   Neuromuscular disorder (HCC)    Neuropathy    legs   Peripheral neuropathy    Pyelonephritis    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[place])    UTI (urinary tract infection)    HO   Vertigo    can be accompanied by nausea   Wears dentures    upper dentures    Past Surgical History:  Procedure  Laterality Date   ABDOMINAL HYSTERECTOMY     APPENDECTOMY     BACK SURGERY     x 3   CERVICAL SPINE SURGERY     metal plate   Okay  X 2   CHOLECYSTECTOMY     CLOSED REDUCTION NASAL FRACTURE N/A 04/09/2020   Procedure: CLOSED REDUCTION NASAL FRACTURE;  Surgeon: Margaretha Sheffield, MD;  Location: [facility];  Service: ENT;  Laterality: N/A;   Jay   removed 18 inches of colon,removed appendix   COLONOSCOPY     COLONOSCOPY WITH PROPOFOL N/A 08/02/2021   Procedure: COLONOSCOPY WITH PROPOFOL;  Surgeon: Lesly Rubenstein, MD;  Location: [facility];  Service: Endoscopy;  Laterality: N/A;   dental implants     3 teeth/ wears dentures   DILATION AND CURETTAGE OF UTERUS  2004   ELBOW FRACTURE SURGERY Left 2008   FIBEROPTIC BRONCHOSCOPY N/A 06/01/2020   Procedure: BEDSIDE BRONCHOSCOPY FIBEROPTIC;  Surgeon: Ottie Glazier, MD;  Location: [facility];  Service: Thoracic;  Laterality: N/A;   FRACTURE SURGERY     JOINT REPLACEMENT Right 2015   partial knee replacement   KYPHOPLASTY     KYPHOPLASTY N/A 04/14/2015   Procedure: KYPHOPLASTY;  Surgeon: Hessie Knows, MD;  Location: [facility];  Service: Orthopedics;  Laterality: N/A;   KYPHOPLASTY N/A 06/04/2015   Procedure: KYPHOPLASTY L-5;  Surgeon: Hessie Knows, MD;  Location: [facility];  Service: Orthopedics;  Laterality: N/A;   KYPHOPLASTY     LUMBAR LAMINECTOMY FOR EPIDURAL ABSCESS N/A 10/12/2020   Procedure: SYNOVIAL CYST RESECTION;  Surgeon: Deetta Perla, MD;  Location: [facility];  Service: Neurosurgery;  Laterality: N/A;   MAXIMUM ACCESS (MAS) TRANSFORAMINAL LUMBAR INTERBODY FUSION (TLIF) 1 LEVEL Left 10/12/2020   Procedure: OPEN L5/S1 TRANSFORAMINAL LUMBAR INTERBODY FUSION (TLIF) 1 LEVEL;  Surgeon: Deetta Perla, MD;  Location: [facility];  Service: Neurosurgery;  Laterality: Left;   MEDIAL PARTIAL KNEE REPLACEMENT Right 2015   NECK/PLATE  6546   OOPHORECTOMY Bilateral    ORIF NASAL FRACTURE N/A  04/09/2020   Procedure: OPEN REDUCTION INTERNAL FIXATION (ORIF) NASAL FRACTURE;  Surgeon: Margaretha Sheffield, MD;  Location: [place];  Service: ENT;  Laterality: N/A;   REPAIR EXTENSOR TENDON Left 01/08/2019   Procedure: Realignment  EXTENSOR TENDON;  Surgeon: Hessie Knows, MD;  Location: [facility];  Service: Orthopedics;  Laterality: Left;   REVERSE SHOULDER ARTHROPLASTY Right 04/18/2019   Procedure: REVERSE SHOULDER ARTHROPLASTY;  Surgeon: Corky Mull, MD;  Location: [facility];  Service: Orthopedics;  Laterality: Right;   SHOULDER ARTHROSCOPY WITH ROTATOR CUFF REPAIR AND SUBACROMIAL DECOMPRESSION Right 04/13/2016   Procedure: SHOULDER ARTHROSCOPY WITH ROTATOR CUFF REPAIR AND SUBACROMIAL DECOMPRESSION, release of long head biceps tendon;  Surgeon: Leanor Kail, MD;  Location: [facility];  Service: Orthopedics;  Laterality: Right;   TOE SURGERY Right 2013   TONSILLECTOMY  1995   UPPER GI ENDOSCOPY     WRIST FRACTURE SURGERY Bilateral      Social History   Tobacco Use   Smoking status: Never   Smokeless tobacco: Never  Vaping Use   Vaping Use: Never used  Substance Use Topics   Alcohol use: No   Drug use: No      Family History  Problem Relation Age of Onset   Diabetes Mother    Emphysema Mother    Emphysema Father    Colon cancer Maternal Grandfather    Breast cancer Neg Hx    Ovarian cancer Neg Hx      Allergies  Allergen Reactions   Ciprofloxacin Itching and Rash   Levofloxacin Hives and Rash   Methotrexate     Other reaction(s): Other (See Comments) Mouth Ulcers/[place] Mouth and throat ulcers  Methotrexate Derivatives     Mouth and throat ulcers   Other Other (See Comments) and Rash    Other reaction(s): Other (See Comments), Unknown Per pts MD she is unable to take this medication because it is contraindicated with her other medications.   Per pts MD she is unable to take this medication because it is contraindicated with her other medications.   Other  reaction(s): Other (See Comments), Unknown Other reaction(s): Other (See Comments), Unknown Per pts MD she is unable to take this medication because it is contraindicated with her other medications.   Per pts MD she is unable to take this medication because it is contraindicated with her other medications.   Other reaction(s): Other (See Comments), Unknown Other reaction(s): Other (See Comments), Unknown Per pts MD she is unable to take this medication because it is contraindicated with her  Per pts MD she is unable to take this medication because it is contraindicated with her other medications.   Other reaction(s): Other (See Comments) Other reaction(s): Other (See Comments), Unknown Per pts MD she is unable to take this medication because it is contraindicated with her other medications.   Per pts MD she is unable to take this medication because it is contraindicated with her other medications.   Other reaction(s): Other (See Comments), Unknown Other reaction(s): Other (See Comments), Unknown Per pts MD she is unable to take this medication because it is contraindicated with her other medications.   Per pts MD she is unable to take this medication because it is contraindicated with her other medications.   Other reaction(s): Other (See Comments), Unknown Other reaction(s): Other (See Comments), Unknown Per pts MD she is unable to take this medication because it is contraindicated with her  Per pts MD she is unable to take this medication because it is contraindicated with her other medications.     Sulfa Antibiotics Other (See Comments)    Per pts MD she is unable to take this medication because it is contraindicated with her other medications.     Crestor [Rosuvastatin] Swelling   Trelegy Ellipta [Fluticasone-Umeclidin-Vilant]     Laryngitis    Cefazolin Itching and Rash   Cefprozil Rash   Cephalosporins Itching and Rash   Doxycycline Rash    Pt tolerates Z pack and amoxicillin    Enbrel [Etanercept] Itching and Rash   Humira [Adalimumab] Itching and Rash   Hydrocodone-Acetaminophen Itching and Other (See Comments)    Itching only   Lorabid [Loracarbef] Itching and Rash   Meropenem Itching and Rash   Oxycodone Itching, Other (See Comments) and Rash    Other reaction(s): Other (See Comments)   Oxycodone-Acetaminophen Itching   Primidone Itching and Rash    REVIEW OF SYSTEMS (Negative unless checked)   Constitutional: [] Weight loss  [] Fever  [] Chills Cardiac: [] Chest pain   [] Chest pressure   [] Palpitations   [] Shortness of breath when laying flat   [] Shortness of breath at rest   [] Shortness of breath with exertion. Vascular:  [] Pain in legs with walking   [] Pain in legs at rest   [] Pain in legs when laying flat   [] Claudication   [] Pain in feet when walking  [] Pain in feet at rest  [] Pain in feet when laying flat   [] History of DVT   [] Phlebitis   [x] Swelling in legs  [x]  Varicose veins   [] Non-healing ulcers Pulmonary:   [] Uses home oxygen   [] Productive cough   [] Hemoptysis   [] Wheeze  [] COPD   []   Asthma Neurologic:  [] Dizziness  [] Blackouts   [] Seizures   [] History of stroke   [] History of TIA  [] Aphasia   [] Temporary blindness   [] Dysphagia   [] Weakness or numbness in arms   [] Weakness or numbness in legs Musculoskeletal:  [x] Arthritis   [] Joint swelling   [] Joint pain   [] Low back pain Hematologic:  [] Easy bruising  [] Easy bleeding   [] Hypercoagulable state   [x] Anemic   Gastrointestinal:  [] Blood in stool   [] Vomiting blood  [x] Gastroesophageal reflux/heartburn   [] Abdominal pain Genitourinary:  [] Chronic kidney disease   [] Difficult urination  [] Frequent urination  [] Burning with urination   [] Hematuria Skin:  [] Rashes   [] Ulcers   [] Wounds Psychological:  [x] History of anxiety   [x]  History of major depression.   Physical Examination  BP 121/71 (BP Location: Right Arm)   Pulse 88   Resp 16   Wt 175 lb 12.8 oz (79.7 kg)   BMI 32.15 kg/m  Gen:  WD/WN,  NAD Head: [place]/AT, No temporalis wasting. Ear/Nose/Throat: Hearing grossly intact, nares w/o erythema or drainage Eyes: Conjunctiva clear. Sclera non-icteric Neck: Supple.  Trachea midline Pulmonary:  Good air movement, no use of accessory muscles.  Cardiac: RRR, no JVD Vascular:  Vessel Right Left  Radial Palpable Palpable                          PT Palpable Palpable  DP Palpable Palpable   Gastrointestinal: soft, non-tender/non-distended. No guarding/reflex.  Musculoskeletal: M/S 5/5 throughout.  No deformity or atrophy. 1+ BLE edema.  Diffuse varicosities bilaterally.  Right ankle is wrapped from her injury. Neurologic: Sensation grossly intact in extremities.  Symmetrical.  Speech is fluent.  Psychiatric: Judgment intact, Mood & affect appropriate for pt's clinical situation. Dermatologic: No rashes or ulcers noted.  No cellulitis or open wounds.      Labs Recent Results (from the past 2160 hour(s))  Urinalysis, Complete     Status: Abnormal   Collection Time: 03/17/22  9:11 AM  Result Value Ref Range   Specific Gravity, UA 1.020 1.005 - 1.030   pH, UA 5.5 5.0 - 7.5   Color, UA Yellow Yellow   Appearance Ur Clear Clear   Leukocytes,UA Negative Negative   Protein,UA Negative Negative/Trace   Glucose, UA Negative Negative   Ketones, UA Trace (A) Negative   RBC, UA Trace (A) Negative   Bilirubin, UA Negative Negative   Urobilinogen, Ur 0.2 0.2 - 1.0 mg/dL   Nitrite, UA Negative Negative   Microscopic Examination See below:   Microscopic Examination     Status: None   Collection Time: 03/17/22  9:11 AM   Urine  Result Value Ref Range   WBC, UA 0-5 0 - 5 /hpf   RBC, Urine 0-2 0 - 2 /hpf   Epithelial Cells (non renal) 0-10 0 - 10 /hpf   Bacteria, UA None seen None seen/Few    Radiology No results found.  Assessment/Plan HTN, goal below 140/80 blood pressure control important in reducing the progression of atherosclerotic disease. On appropriate oral  medications.     Type 2 diabetes mellitus with diabetic neuropathy (HCC) blood glucose control important in reducing the progression of atherosclerotic disease. Also, involved in wound healing. On appropriate medications.  Lymphedema Patient is clearly developed lymphedema from chronic scarring and lymphatic channels with chronic swelling.  She now has some skin thickening and swelling refractory to compression and elevation, and would benefit from a lymphedema pump.  We  are also going to performing venous ablation on her right leg and hopefully that will help the swelling some.  She will continue compression socks, elevation, and staying active.  Varicose veins of both lower extremities with pain She has undergone laser ablation of the left great saphenous vein with good results but still has painful varicosities and swelling of the left lower extremity.  We were not able to do the sclerotherapy treatments that were planned secondary to several major issues and surgeries required for her eyes delaying things.  This would still be an option going forward. For the right leg, she had long segment great saphenous vein reflux including the saphenofemoral junction on duplex today.  Laser ablation of this leg would be a reasonable option to treat her venous insufficiency.  Risks and benefits are discussed and she is agreeable to proceed.    Leotis Pain, MD  04/19/2022 5:28 PM    This note was created with Dragon medical transcription system.  Any errors from dictation are purely unintentional

## 2022-04-21 ENCOUNTER — Ambulatory Visit: Payer: Medicare Other | Attending: Cardiology | Admitting: Cardiology

## 2022-04-21 ENCOUNTER — Encounter: Payer: Self-pay | Admitting: Cardiology

## 2022-04-21 ENCOUNTER — Other Ambulatory Visit: Payer: Self-pay

## 2022-04-21 VITALS — BP 119/75 | HR 93 | Ht 62.0 in | Wt 174.0 lb

## 2022-04-21 DIAGNOSIS — R0602 Shortness of breath: Secondary | ICD-10-CM | POA: Diagnosis not present

## 2022-04-21 DIAGNOSIS — E1140 Type 2 diabetes mellitus with diabetic neuropathy, unspecified: Secondary | ICD-10-CM

## 2022-04-21 DIAGNOSIS — R600 Localized edema: Secondary | ICD-10-CM

## 2022-04-21 DIAGNOSIS — E782 Mixed hyperlipidemia: Secondary | ICD-10-CM

## 2022-04-21 DIAGNOSIS — I2510 Atherosclerotic heart disease of native coronary artery without angina pectoris: Secondary | ICD-10-CM

## 2022-04-21 MED ORDER — FUROSEMIDE 20 MG PO TABS: 20.0000 mg | ORAL_TABLET | Freq: Every day | ORAL | 2 refills | Status: DC

## 2022-04-21 NOTE — Patient Instructions (Signed)
Medication Instructions:  Your physician recommends that you continue on your current medications as directed. Please refer to the Current Medication list given to you today.  *If you need a refill on your cardiac medications before your next appointment, please call your pharmacy*   Lab Work: None  If you have labs (blood work) drawn today and your tests are completely normal, you will receive your results only by: [place] (if you have MyChart) OR A paper copy in the mail If you have any lab test that is abnormal or we need to change your treatment, we will call you to review the results.   Testing/Procedures: Your physician has requested that you have an echocardiogram. Echocardiography is a painless test that uses sound waves to create images of your heart. It provides your doctor with information about the size and shape of your heart and how well your heart's chambers and valves are working. This procedure takes approximately one hour. There are no restrictions for this procedure.    Follow-Up: At [facility], you and your health needs are our priority.  As part of our continuing mission to provide you with exceptional heart care, we have created designated Provider Care Teams.  These Care Teams include your primary Cardiologist (physician) and Advanced Practice Providers (APPs -  Physician Assistants and Nurse Practitioners) who all work together to provide you with the care you need, when you need it.  Your next appointment:   3 month(s)  The format for your next appointment:   In Person  Provider:   Ida Rogue, MD or Gerrie Nordmann, NP        Important Information About Sugar

## 2022-04-26 ENCOUNTER — Ambulatory Visit: Payer: Medicare Other | Admitting: Dermatology

## 2022-04-27 ENCOUNTER — Ambulatory Visit: Payer: Medicare Other | Admitting: Dermatology

## 2022-04-27 DIAGNOSIS — L821 Other seborrheic keratosis: Secondary | ICD-10-CM | POA: Diagnosis not present

## 2022-04-27 DIAGNOSIS — I781 Nevus, non-neoplastic: Secondary | ICD-10-CM

## 2022-04-27 DIAGNOSIS — L820 Inflamed seborrheic keratosis: Secondary | ICD-10-CM | POA: Diagnosis not present

## 2022-04-27 DIAGNOSIS — L578 Other skin changes due to chronic exposure to nonionizing radiation: Secondary | ICD-10-CM | POA: Diagnosis not present

## 2022-04-27 NOTE — Patient Instructions (Signed)
Due to recent changes in healthcare laws, you may see results of your pathology and/or laboratory studies on MyChart before the doctors have had a chance to review them. We understand that in some cases there may be results that are confusing or concerning to you. Please understand that not all results are received at the same time and often the doctors may need to interpret multiple results in order to provide you with the best plan of care or course of treatment. Therefore, we ask that you please give [place] 2 business days to thoroughly review all your results before contacting the office for clarification. Should we see a critical lab result, you will be contacted sooner.   If You Need Anything After Your Visit  If you have any questions or concerns for your doctor, please call our main line at [phone] and press option 4 to reach your doctor's medical assistant. If no one answers, please leave a voicemail as directed and we will return your call as soon as possible. Messages left after 4 pm will be answered the following business day.   You may also send [place] a message via [place]. We typically respond to MyChart messages within 1-2 business days.  For prescription refills, please ask your pharmacy to contact our office. Our fax number is [phone].  If you have an urgent issue when the clinic is closed that cannot wait until the next business day, you can page your doctor at the number below.    Please note that while we do our best to be available for urgent issues outside of office hours, we are not available 24/7.   If you have an urgent issue and are unable to reach [place], you may choose to seek medical care at your doctor's office, retail clinic, urgent care center, or emergency room.  If you have a medical emergency, please immediately call 911 or go to the emergency department.  Pager Numbers  - Dr. Nehemiah Massed: [phone]  - Dr. Laurence Ferrari: [phone]  - Dr. Nicole Kindred:  [phone]  In the event of inclement weather, please call our main line at [phone] for an update on the status of any delays or closures.  Dermatology Medication Tips: Please keep the boxes that topical medications come in in order to help keep track of the instructions about where and how to use these. Pharmacies typically print the medication instructions only on the boxes and not directly on the medication tubes.   If your medication is too expensive, please contact our office at [phone] option 4 or send [place] a message through [place].   We are unable to tell what your co-pay for medications will be in advance as this is different depending on your insurance coverage. However, we may be able to find a substitute medication at lower cost or fill out paperwork to get insurance to cover a needed medication.   If a prior authorization is required to get your medication covered by your insurance company, please allow [place] 1-2 business days to complete this process.  Drug prices often vary depending on where the prescription is filled and some pharmacies may offer cheaper prices.  The website www.goodrx.com contains coupons for medications through different pharmacies. The prices here do not account for what the cost may be with help from insurance (it may be cheaper with your insurance), but the website can give you the price if you did not use any insurance.  - You can print the associated coupon and take it with  your prescription to the pharmacy.  - You may also stop by our office during regular business hours and pick up a GoodRx coupon card.  - If you need your prescription sent electronically to a different pharmacy, notify our office through [facility] or by phone at [phone] option 4.     Si Usted Necesita Algo Despus de Su Visita  Tambin puede enviarnos un mensaje a travs de Pharmacist, community. Por lo general respondemos a los mensajes de MyChart en el transcurso de 1 a 2  das hbiles.  Para renovar recetas, por favor pida a su farmacia que se ponga en contacto con nuestra oficina. Harland Dingwall de fax es [place] [phone].  Si tiene un asunto urgente cuando la clnica est cerrada y que no puede esperar hasta el siguiente da hbil, puede llamar/localizar a su doctor(a) al nmero que aparece a continuacin.   Por favor, tenga en cuenta que aunque hacemos todo lo posible para estar disponibles para asuntos urgentes fuera del horario de [place], no estamos disponibles las 24 horas del da, los 7 das de la [place].   Si tiene un problema urgente y no puede comunicarse con nosotros, puede optar por buscar atencin mdica  en el consultorio de su doctor(a), en una clnica privada, en un centro de atencin urgente o en una sala de emergencias.  Si tiene Engineering geologist, por favor llame inmediatamente al 911 o vaya a la sala de emergencias.  Nmeros de bper  - Dr. Nehemiah Massed: [phone]  - Dra. Moye: [phone]  - Dra. Nicole Kindred: [phone]  En caso de inclemencias del [place], por favor llame a Johnsie Kindred principal al [phone] para una actualizacin sobre el [place] de cualquier retraso o cierre.  Consejos para la medicacin en dermatologa: Por favor, guarde las cajas en las que vienen los medicamentos de uso tpico para ayudarle a seguir las instrucciones sobre dnde y cmo usarlos. Las farmacias generalmente imprimen las instrucciones del medicamento slo en las cajas y no directamente en los tubos del [place].   Si su medicamento es muy caro, por favor, pngase en contacto con Zigmund Daniel llamando al [phone] y presione la opcin 4 o envenos un mensaje a travs de Pharmacist, community.   No podemos decirle cul ser su copago por los medicamentos por adelantado ya que esto es diferente dependiendo de la cobertura de su seguro. Sin embargo, es posible que podamos encontrar un medicamento sustituto a Electrical engineer un formulario para que el  seguro cubra el medicamento que se considera necesario.   Si se requiere una autorizacin previa para que su compaa de seguros [place] su medicamento, por favor permtanos de 1 a 2 das hbiles para completar este proceso.  Los precios de los medicamentos varan con frecuencia dependiendo del Environmental consultant de dnde se surte la receta y alguna farmacias pueden ofrecer precios ms baratos.  El sitio web www.goodrx.com tiene cupones para medicamentos de Airline pilot. Los precios aqu no tienen en cuenta lo que podra costar con la ayuda del seguro (puede ser ms barato con su seguro), pero el sitio web puede darle el precio si no utiliz Research scientist (physical sciences).  - Puede imprimir el cupn correspondiente y llevarlo con su receta a la farmacia.  - Tambin puede pasar por nuestra oficina durante el horario de atencin regular y Charity fundraiser una tarjeta de cupones de GoodRx.  - Si necesita que su receta se enve electrnicamente a Chiropodist, informe a nuestra oficina a travs [place]  o por telfono llamando al [phone] y presione la opcin 4.

## 2022-04-27 NOTE — Progress Notes (Signed)
   Follow-Up Visit   Subjective  Nicole Bass is a 71 y.o. female who presents for the following: ISK recheck (Of the R temple and L lat brow - some residual left today, patient would like them treated) and Irregular skin lesion (On the forehead - patient is concerned and would like lesion checked today). The patient has spots, moles and lesions to be evaluated, some may be new or changing and the patient has concerns that these could be cancer.  The following portions of the chart were reviewed this encounter and updated as appropriate:   Tobacco  Allergies  Meds  Problems  Med Hx  Surg Hx  Fam Hx     Review of Systems:  No other skin or systemic complaints except as noted in HPI or Assessment and Plan.  Objective  Well appearing patient in no apparent distress; mood and affect are within normal limits.  A focused examination was performed including the face. Relevant physical exam findings are noted in the Assessment and Plan.  R temple x 1, L lat brow x 1 Erythematous stuck-on, waxy papule or plaque  L mid dorsum nose Blanching pink macule.   Assessment & Plan  Inflamed seborrheic keratosis R temple x 1, L lat brow x 1 Symptomatic, irritating, patient would like treated. Destruction of lesion - R temple x 1, L lat brow x 1 Complexity: simple   Destruction method: cryotherapy   Informed consent: discussed and consent obtained   Timeout:  patient name, date of birth, surgical site, and procedure verified Lesion destroyed using liquid nitrogen: Yes   Region frozen until ice ball extended beyond lesion: Yes   Outcome: patient tolerated procedure well with no complications   Post-procedure details: wound care instructions given    Telangiectasia L mid dorsum nose Benign, observe. Stable compared to previous visit and photo.   Actinic Damage - chronic, secondary to cumulative UV radiation exposure/sun exposure over time - diffuse scaly erythematous macules with  underlying dyspigmentation - Recommend daily broad spectrum sunscreen SPF 30+ to sun-exposed areas, reapply every 2 hours as needed.  - Recommend staying in the shade or wearing long sleeves, sun glasses (UVA+UVB protection) and wide brim hats (4-inch brim around the entire circumference of the hat). - Call for new or changing lesions.  Seborrheic Keratoses - Stuck-on, waxy, tan-brown papules and/or plaques  - Benign-appearing - Discussed benign etiology and prognosis. - Observe - Call for any changes  Return in about 1 year (around 04/28/2023) for TBSE.  Luther Redo, CMA, am acting as scribe for Sarina Ser, MD . Documentation: I have reviewed the above documentation for accuracy and completeness, and I agree with the above.  Sarina Ser, MD

## 2022-05-01 ENCOUNTER — Encounter: Payer: Self-pay | Admitting: Dermatology

## 2022-05-11 DIAGNOSIS — K219 Gastro-esophageal reflux disease without esophagitis: Secondary | ICD-10-CM | POA: Insufficient documentation

## 2022-05-11 DIAGNOSIS — H35341 Macular cyst, hole, or pseudohole, right eye: Secondary | ICD-10-CM | POA: Insufficient documentation

## 2022-05-11 DIAGNOSIS — G4733 Obstructive sleep apnea (adult) (pediatric): Secondary | ICD-10-CM

## 2022-05-18 ENCOUNTER — Other Ambulatory Visit: Payer: Self-pay | Admitting: Rheumatology

## 2022-05-18 DIAGNOSIS — M79672 Pain in left foot: Secondary | ICD-10-CM

## 2022-05-18 DIAGNOSIS — M25475 Effusion, left foot: Secondary | ICD-10-CM

## 2022-05-18 DIAGNOSIS — M7750 Other enthesopathy of unspecified foot: Secondary | ICD-10-CM

## 2022-05-27 ENCOUNTER — Ambulatory Visit: Payer: Medicare Other | Attending: Cardiology

## 2022-05-27 DIAGNOSIS — I5189 Other ill-defined heart diseases: Secondary | ICD-10-CM

## 2022-05-27 DIAGNOSIS — R0602 Shortness of breath: Secondary | ICD-10-CM

## 2022-05-27 HISTORY — DX: Other ill-defined heart diseases: I51.89

## 2022-05-27 LAB — ECHOCARDIOGRAM COMPLETE
AR max vel: 3.26 cm2
AV Area VTI: 3.34 cm2
AV Area mean vel: 2.91 cm2
AV Mean grad: 3 mmHg
AV Peak grad: 5.2 mmHg
Ao pk vel: 1.14 m/s
Area-P 1/2: 3.21 cm2
Calc EF: 54.2 %
S' Lateral: 2.7 cm
Single Plane A2C EF: 54.2 %
Single Plane A4C EF: 55.1 %

## 2022-06-03 DIAGNOSIS — J9601 Acute respiratory failure with hypoxia: Secondary | ICD-10-CM | POA: Insufficient documentation

## 2022-06-03 DIAGNOSIS — N179 Acute kidney failure, unspecified: Secondary | ICD-10-CM

## 2022-06-09 ENCOUNTER — Ambulatory Visit: Payer: Medicare Other

## 2022-06-09 ENCOUNTER — Ambulatory Visit: Admission: RE | Admit: 2022-06-09 | Payer: Medicare Other | Source: Ambulatory Visit

## 2022-06-10 ENCOUNTER — Other Ambulatory Visit: Payer: Self-pay | Admitting: Internal Medicine

## 2022-06-10 DIAGNOSIS — Z1231 Encounter for screening mammogram for malignant neoplasm of breast: Secondary | ICD-10-CM

## 2022-06-13 ENCOUNTER — Encounter (INDEPENDENT_AMBULATORY_CARE_PROVIDER_SITE_OTHER): Payer: Self-pay

## 2022-06-22 ENCOUNTER — Ambulatory Visit: Payer: Medicare Other

## 2022-06-23 ENCOUNTER — Ambulatory Visit
Admission: RE | Admit: 2022-06-23 | Discharge: 2022-06-23 | Disposition: A | Discharge status: Home / Self Care | Payer: Medicare Other | Source: Ambulatory Visit | Attending: Rheumatology | Admitting: Rheumatology

## 2022-06-23 ENCOUNTER — Ambulatory Visit
Admission: RE | Admit: 2022-06-23 | Discharge: 2022-06-23 | Disposition: A | Discharge status: Home / Self Care | Payer: Medicare Other | Source: Ambulatory Visit | Attending: Urology | Admitting: Urology

## 2022-06-23 DIAGNOSIS — R109 Unspecified abdominal pain: Secondary | ICD-10-CM | POA: Insufficient documentation

## 2022-06-23 DIAGNOSIS — M7750 Other enthesopathy of unspecified foot: Secondary | ICD-10-CM

## 2022-06-23 DIAGNOSIS — M25475 Effusion, left foot: Secondary | ICD-10-CM | POA: Insufficient documentation

## 2022-06-23 DIAGNOSIS — M79672 Pain in left foot: Secondary | ICD-10-CM

## 2022-06-28 ENCOUNTER — Telehealth: Payer: Self-pay | Admitting: *Deleted

## 2022-06-28 NOTE — Telephone Encounter (Signed)
RUS is fairly unremarkable.  No evidence of an obstructing stone.   per DR. Erlene Quan .   Notified patient as instructed,

## 2022-07-19 ENCOUNTER — Ambulatory Visit: Payer: Medicare Other

## 2022-07-21 NOTE — Progress Notes (Signed)
Cardiology Clinic Note   Patient Name: Nicole Bass Date of Encounter: 07/22/2022  Primary Care Provider:  [name], MD Primary Cardiologist:  Ida Rogue, MD  Patient Profile    71 year old female with a history of coronary artery calcification, central hypertension, rheumatoid arthritis, struct of sleep apnea on CPAP, venous insufficiency status post ablation, presents today for follow-up.  Past Medical History    Past Medical History:  Diagnosis Date   Anemia    iron everyday, 3 iron infusions last one August 2020   Anxiety    nervous   Arthritis    rheumatoid   Collagen vascular disease ([place])    Crohn disease ([place])    Crohn's disease ([place])    Crohn's disease ([place])    DDD (degenerative disc disease), cervical    DDD (degenerative disc disease), cervical    DDD (degenerative disc disease), cervical 2003   DDD (degenerative disc disease), cervical    DDD (degenerative disc disease), lumbar    Degenerative disc disease, cervical    Depression    Diarrhea    Dyspnea    out of shape   Fatty liver    GERD (gastroesophageal reflux disease)    Hand weakness    left hand worse, small tremors   Headache    with accident   History of chicken pox    History of claustrophobia    History of kidney stones    History of shingles    HOH (hard of hearing)    left ear   Immune deficiency disorder ([place])    Lymphatic disorder    Meniere's disease    Motion sickness    Nasal fracture 03/27/2020   due to fall   Neuromuscular disorder (HCC)    Neuropathy    legs   Peripheral neuropathy    Pyelonephritis    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[place])    UTI (urinary tract infection)    HO   Vertigo    can be accompanied by nausea   Wears dentures    upper dentures   Past Surgical History:  Procedure Laterality Date   ABDOMINAL HYSTERECTOMY     APPENDECTOMY     BACK SURGERY     x 3   CERVICAL SPINE SURGERY     metal plate   [place]    X 2   CHOLECYSTECTOMY     CLOSED REDUCTION NASAL FRACTURE N/A 04/09/2020   Procedure: CLOSED REDUCTION NASAL FRACTURE;  Surgeon: Margaretha Sheffield, MD;  Location: [facility];  Service: ENT;  Laterality: N/A;   [place]   removed 18 inches of colon,removed appendix   COLONOSCOPY     COLONOSCOPY WITH PROPOFOL N/A 08/02/2021   Procedure: COLONOSCOPY WITH PROPOFOL;  Surgeon: Lesly Rubenstein, MD;  Location: [facility];  Service: Endoscopy;  Laterality: N/A;   dental implants     3 teeth/ wears dentures   DILATION AND CURETTAGE OF UTERUS  2004   ELBOW FRACTURE SURGERY Left 2008   FIBEROPTIC BRONCHOSCOPY N/A 06/01/2020   Procedure: BEDSIDE BRONCHOSCOPY FIBEROPTIC;  Surgeon: Ottie Glazier, MD;  Location: [facility];  Service: Thoracic;  Laterality: N/A;   FRACTURE SURGERY     JOINT REPLACEMENT Right 2015   partial knee replacement   KYPHOPLASTY     KYPHOPLASTY N/A 04/14/2015   Procedure: KYPHOPLASTY;  Surgeon: Hessie Knows, MD;  Location: [facility];  Service: Orthopedics;  Laterality: N/A;   KYPHOPLASTY N/A 06/04/2015  Procedure: KYPHOPLASTY L-5;  Surgeon: Hessie Knows, MD;  Location: [facility];  Service: Orthopedics;  Laterality: N/A;   KYPHOPLASTY     LUMBAR LAMINECTOMY FOR EPIDURAL ABSCESS N/A 10/12/2020   Procedure: SYNOVIAL CYST RESECTION;  Surgeon: Deetta Perla, MD;  Location: [facility];  Service: Neurosurgery;  Laterality: N/A;   MAXIMUM ACCESS (MAS) TRANSFORAMINAL LUMBAR INTERBODY FUSION (TLIF) 1 LEVEL Left 10/12/2020   Procedure: OPEN L5/S1 TRANSFORAMINAL LUMBAR INTERBODY FUSION (TLIF) 1 LEVEL;  Surgeon: Deetta Perla, MD;  Location: [facility];  Service: Neurosurgery;  Laterality: Left;   MEDIAL PARTIAL KNEE REPLACEMENT Right 2015   NECK/PLATE  1155   OOPHORECTOMY Bilateral    ORIF NASAL FRACTURE N/A 04/09/2020   Procedure: OPEN REDUCTION INTERNAL FIXATION (ORIF) NASAL FRACTURE;  Surgeon: Margaretha Sheffield, MD;  Location: [facility];  Service: ENT;  Laterality:  N/A;   REPAIR EXTENSOR TENDON Left 01/08/2019   Procedure: Realignment  EXTENSOR TENDON;  Surgeon: Hessie Knows, MD;  Location: [facility];  Service: Orthopedics;  Laterality: Left;   REVERSE SHOULDER ARTHROPLASTY Right 04/18/2019   Procedure: REVERSE SHOULDER ARTHROPLASTY;  Surgeon: Corky Mull, MD;  Location: [facility];  Service: Orthopedics;  Laterality: Right;   SHOULDER ARTHROSCOPY WITH ROTATOR CUFF REPAIR AND SUBACROMIAL DECOMPRESSION Right 04/13/2016   Procedure: SHOULDER ARTHROSCOPY WITH ROTATOR CUFF REPAIR AND SUBACROMIAL DECOMPRESSION, release of long head biceps tendon;  Surgeon: Leanor Kail, MD;  Location: [facility];  Service: Orthopedics;  Laterality: Right;   TOE SURGERY Right 2013   TONSILLECTOMY  1995   UPPER GI ENDOSCOPY     WRIST FRACTURE SURGERY Bilateral     Allergies  Allergies  Allergen Reactions   Ciprofloxacin Itching and Rash   Levofloxacin Hives and Rash   Methotrexate     Other reaction(s): Other (See Comments) Mouth Ulcers/[place] Mouth and throat ulcers   Methotrexate Derivatives     Mouth and throat ulcers   Other Other (See Comments) and Rash    Other reaction(s): Other (See Comments), Unknown Per pts MD she is unable to take this medication because it is contraindicated with her other medications.   Per pts MD she is unable to take this medication because it is contraindicated with her other medications.   Other reaction(s): Other (See Comments), Unknown Other reaction(s): Other (See Comments), Unknown Per pts MD she is unable to take this medication because it is contraindicated with her other medications.   Per pts MD she is unable to take this medication because it is contraindicated with her other medications.   Other reaction(s): Other (See Comments), Unknown Other reaction(s): Other (See Comments), Unknown Per pts MD she is unable to take this medication because it is contraindicated with her  Per pts MD she is unable to take this medication  because it is contraindicated with her other medications.   Other reaction(s): Other (See Comments) Other reaction(s): Other (See Comments), Unknown Per pts MD she is unable to take this medication because it is contraindicated with her other medications.   Per pts MD she is unable to take this medication because it is contraindicated with her other medications.   Other reaction(s): Other (See Comments), Unknown Other reaction(s): Other (See Comments), Unknown Per pts MD she is unable to take this medication because it is contraindicated with her other medications.   Per pts MD she is unable to take this medication because it is contraindicated with her other medications.   Other reaction(s): Other (See Comments), Unknown Other reaction(s): Other (See Comments), Unknown  Per pts MD she is unable to take this medication because it is contraindicated with her  Per pts MD she is unable to take this medication because it is contraindicated with her other medications.     Sulfa Antibiotics Other (See Comments)    Per pts MD she is unable to take this medication because it is contraindicated with her other medications.     Rosuvastatin Swelling and Other (See Comments)    And cramps   Trelegy Ellipta [Fluticasone-Umeclidin-Vilant]     Laryngitis    Cefazolin Itching and Rash   Cefprozil Rash   Cephalosporins Itching and Rash   Doxycycline Rash    Pt tolerates Z pack and amoxicillin   Enbrel [Etanercept] Itching and Rash   Humira [Adalimumab] Itching and Rash   Hydrocodone-Acetaminophen Itching and Other (See Comments)    Itching only   Lorabid [Loracarbef] Itching and Rash   Meropenem Itching and Rash   Oxycodone Itching, Other (See Comments) and Rash    Other reaction(s): Other (See Comments)   Oxycodone-Acetaminophen Itching   Primidone Itching and Rash    History of Present Illness    Niko Jakel is a 71 year old female with previously mentioned past medical history of  coronary calcification, send hypertension, rheumatoid arthritis, struct sleep apnea on CPAP, venous insufficiency status post venous ablation, and peripheral edema.  CT of the chest for evaluation of sternal pain status post mechanical fall which showed left anterior descending and circumflex and right coronary artery atherosclerotic calcifications and punctuated classified granuloma of the right middle lobe, left upper lobe tiny punctuate calcified granuloma suspected distal bronchiectasis with mild airway plugging in the left lower lobe.  She recently underwent Lexiscan Myoview 07/04/2020 which showed LVEF 75%.  No evidence of scar or ischemia.  PFTs were completed and revealed abnormal results.  She was admitted to [facility] from 3/28 - 11/12/2020 for syncope following a recent back surgery.  Potassium was 2.8 magnesium 1.6 and D-dimer was elevated.  CTA negative for PE.  30-day monitor placed as outpatient revealed predominant sinus rhythm with mean heart rate of 92 bpm, sinus tachycardia was observed.  There were 14 beat ventricular runs and premature atrial contractions.  She remained asymptomatic while wearing the monitor.  She was last seen in clinic 04/21/2022 at that time she had continued shortness of breath and worsening edema to her bilateral lower extremities.  She also recently seen vascular and advised her she would need to continue with completion of the venous ablation on the right.  She had an echocardiogram that was ordered.  Echocardiogram revealed LVEF 60-65%, no regional wall motion abnormalities, G1 DD, and no valvular abnormalities.  She returns to clinic today stating that she has been doing well with a cardiac standpoint.  She continues to send she has been tremors, changes in memory restless leg syndrome, some concerns for controlling and heavy breathing continues to be followed by pulmonary. She also states that she has a stress fracture to the left foot and is to be wearing a boot but  she wears it while she is at home and due to her ambulatory dysfunction she is scared that the boot will make her fall.  She states that she just followed up with her PCP and was placed on antianxiety medicine as they do have a lot of things that are going on between procedures and surgeries upcoming after the holidays for herself and her husband.  She denies any recent hospitalizations or visits to the  emergency department.   Home Medications    Current Outpatient Medications  Medication Sig Dispense Refill   acetaminophen (TYLENOL) 650 MG CR tablet Take by mouth.     alendronate (FOSAMAX) 70 MG tablet Take 70 mg by mouth every Monday. Take with a full glass of water on an empty stomach.     ALPRAZolam (XANAX) 0.25 MG tablet Take 0.25 mg by mouth at bedtime as needed for anxiety. Take 1 tablet (0.25 mg total) by mouth once daily as needed for Sleep for up to 30 days     aspirin EC 81 MG tablet Take 81 mg by mouth daily. Swallow whole.     betamethasone dipropionate 0.05 % lotion Apply topically 2 (two) times daily as needed (Rash). Apply thin coat to affected areas twice daily for 2 weeks 60 mL 0   cetirizine (ZYRTEC) 10 MG tablet Take 10 mg by mouth daily.     chlorhexidine (PERIDEX) 0.12 % solution Use as directed 15 mLs in the mouth or throat as needed (mouth sores).     cholecalciferol (VITAMIN D3) 25 MCG (1000 UT) tablet Take 1,000 Units by mouth daily.     citalopram (CELEXA) 10 MG tablet Take 1 tablet by mouth daily.     cyanocobalamin (,VITAMIN B-12,) 1000 MCG/ML injection Inject 1,000 mcg into the muscle every 30 (thirty) days.     diazepam (VALIUM) 10 MG tablet Take 1 tablet (10 mg total) by mouth daily as needed (vaginal pain with intercourse, use before sexual activity per vagina). 30 tablet 0   gabapentin (NEURONTIN) 100 MG capsule Take 100 mg by mouth every morning.     gabapentin (NEURONTIN) 400 MG capsule Take 400 mg by mouth at bedtime.     lidocaine (XYLOCAINE) 2 % solution Use  as directed 15 mLs in the mouth or throat as needed for mouth pain. 100 mL 1   magnesium oxide (MAG-OX) 400 MG tablet Take 400 mg by mouth every Monday, Wednesday, and Friday.     Multiple Vitamin (MULTIVITAMIN WITH MINERALS) TABS tablet Take 1 tablet by mouth daily.     omeprazole (PRILOSEC) 40 MG capsule Take 40 mg by mouth daily before breakfast.     potassium chloride SA (KLOR-CON M) 20 MEQ tablet Take by mouth.     predniSONE (DELTASONE) 1 MG tablet Take 3 mg by mouth daily with breakfast.      Probiotic Product (PROBIOTIC PO) Take 1 capsule by mouth daily. Culturelle Probiotic     RESTASIS 0.05 % ophthalmic emulsion 1 drop 2 (two) times daily.     rOPINIRole (REQUIP) 1 MG tablet Take 1 mg by mouth at bedtime as needed (for restless legs).      sucralfate (CARAFATE) 1 g tablet Take 0.5 g by mouth 3 (three) times daily with meals as needed (stomach cramping/Crohn's symptoms).     Tofacitinib Citrate (XELJANZ) 5 MG TABS Take 1 tablet by mouth 2 (two) times daily.     traMADol (ULTRAM) 50 MG tablet Take 50 mg by mouth 2 (two) times daily as needed.     valACYclovir (VALTREX) 500 MG tablet Take 1 tablet twice daily for 5 days for flares. Start at first sign of symptoms. 30 tablet 5   ALPRAZolam (XANAX) 0.5 MG tablet Take one tab 1 hour before scheduled procedure and one tab when you arrive in office (Patient not taking: Reported on 07/22/2022) 2 tablet 0   clindamycin (CLEOCIN T) 1 % external solution Apply to sores on scalp QD  PRN. (Patient not taking: Reported on 03/24/2022) 60 mL 3   clindamycin (CLEOCIN) 300 MG capsule Take 300 mg by mouth 3 (three) times daily. (Patient not taking: Reported on 03/24/2022)     estradiol (ESTRACE VAGINAL) 0.1 MG/GM vaginal cream Apply a pea size amount 3 nights a week (Patient not taking: Reported on 03/24/2022) 42.5 g 12   furosemide (LASIX) 20 MG tablet Take 1 tablet (20 mg total) by mouth daily. 30 tablet 2   ketoconazole (NIZORAL) 2 % shampoo Shampoo into the  scalp let sit 5-10 minutes then wash out. Use twice per week. (Patient not taking: Reported on 03/24/2022) 120 mL 5   No current facility-administered medications for this visit.     Family History    Family History  Problem Relation Age of Onset   Diabetes Mother    Emphysema Mother    Emphysema Father    Colon cancer Maternal Grandfather    Breast cancer Neg Hx    Ovarian cancer Neg Hx    She indicated that her mother is deceased. She indicated that her father is deceased. She indicated that the status of her maternal grandfather is unknown. She indicated that the status of her neg hx is unknown.  Social History    Social History   Socioeconomic History   Marital status: Married    Spouse name: Not on file   Number of children: Not on file   Years of education: Not on file   Highest education level: Not on file  Occupational History   Not on file  Tobacco Use   Smoking status: Never   Smokeless tobacco: Never  Vaping Use   Vaping Use: Never used  Substance and Sexual Activity   Alcohol use: No   Drug use: No   Sexual activity: Yes    Birth control/protection: Surgical  Other Topics Concern   Not on file  Social History Narrative   Not on file   Social Determinants of Health   Financial Resource Strain: Unknown (08/23/2018)   Overall Financial Resource Strain (CARDIA)    Difficulty of Paying Living Expenses: Patient refused  Food Insecurity: Unknown (08/23/2018)   Hunger Vital Sign    Worried About Running Out of Food in the Last Year: Patient refused    [place] in the Last Year: Patient refused  Transportation Needs: Unknown (08/23/2018)   PRAPARE - Transportation    Lack of Transportation (Medical): Patient refused    Lack of Transportation (Non-Medical): Patient refused  Physical Activity: Unknown (08/23/2018)   Exercise Vital Sign    Days of Exercise per Week: Patient refused    Minutes of Exercise per Session: Patient refused  Stress: Unknown  (08/23/2018)   [place]    Feeling of Stress : Patient refused  Social Connections: Unknown (08/23/2018)   Social Connection and Isolation Panel [NHANES]    Frequency of Communication with Friends and Family: Patient refused    Frequency of Social Gatherings with Friends and Family: Patient refused    Attends Religious Services: Patient refused    Active Member of Clubs or Organizations: Patient refused    Attends Archivist Meetings: Patient refused    Marital Status: Patient refused  Intimate Partner Violence: Unknown (08/23/2018)   Humiliation, Afraid, Rape, and Kick questionnaire    Fear of Current or Ex-Partner: Patient refused    Emotionally Abused: Patient refused    Physically Abused: Patient refused  Sexually Abused: Patient refused     Review of Systems    General:  No chills, fever, night sweats or weight changes.  Endorses fatigue Cardiovascular:  No chest pain, dyspnea on exertion, edema, orthopnea, palpitations, paroxysmal nocturnal dyspnea. Dermatological: No rash, lesions/masses Respiratory: No cough, dyspnea Urologic: No hematuria, dysuria Abdominal:   No nausea, vomiting, diarrhea, bright red blood per rectum, melena, or hematemesis Neurologic:  No visual changes, endorses wkns, changes in mental status, tremors, changes in balance, All other systems reviewed and are otherwise negative except as noted above.   Physical Exam    VS:  BP 136/78 (BP Location: Left Arm, Patient Position: Sitting, Cuff Size: Normal)   Pulse 86   Ht 5' 2"  (1.575 m)   Wt 173 lb (78.5 kg)   SpO2 97%   BMI 31.64 kg/m  , BMI Body mass index is 31.64 kg/m.     GEN: Well nourished, well developed, in no acute distress. HEENT: normal.  Glasses on. Neck: Supple, no JVD, carotid bruits, or masses. Cardiac: RRR, no murmurs, rubs, or gallops. No clubbing, cyanosis, edema.  Radials 2+/PT 2+ and equal bilaterally.   CEAP III varicose veins Respiratory:  Respirations regular and unlabored, clear to auscultation bilaterally. GI: Soft, nontender, nondistended, BS + x 4. MS: no deformity or atrophy. Skin: warm and dry, no rash. Neuro:  Strength and sensation are intact. Psych: Normal affect.  Accessory Clinical Findings    ECG personally reviewed by me today-no new tracings were completed  Lab Results  Component Value Date   WBC 5.2 11/12/2020   HGB 8.6 (L) 11/12/2020   HCT 27.1 (L) 11/12/2020   MCV 97.8 11/12/2020   PLT 329 11/12/2020   Lab Results  Component Value Date   CREATININE 1.00 09/30/2021   BUN 9 11/12/2020   NA 142 11/12/2020   K 3.9 11/12/2020   CL 106 11/12/2020   CO2 29 11/12/2020   Lab Results  Component Value Date   ALT 12 11/12/2020   AST 26 11/12/2020   ALKPHOS 79 11/12/2020   BILITOT 0.5 11/12/2020   No results found for: "CHOL", "HDL", "LDLCALC", "LDLDIRECT", "TRIG", "CHOLHDL"  No results found for: "HGBA1C"  Assessment & Plan   1.  Coronary artery disease found on CT scan.  She denies chest discomfort dysphagia symptoms.  Echocardiogram revealed no regional wall motion disease.  Shortness of breath and dyspnea on exertion has resolved as well.  She has been continued on aspirin.  Continues to remain off of statin is reluctant to start any new or additional medications at this time with multiple procedures that she has coming up.  2.  Hyperlipidemia with LDL. She had an intolerance to Crestor. Continues to be followed by PCP.  3.  Lower extremity edema with venous insufficiency.  She had previously undergone venous ablation to the left leg and has been advised by vein and vascular that she will need to have the procedure done on the right leg.  She continues to participate in conservative therapy, elevating her extremities, foot Pumps, decreasing sodium intake, and wearing compression stockings.  4.  Shortness of breath and dyspnea on exertion have resolved. Recent  echocardiogram revealed LVEF 60-65%, GIDD, no RWMA, and no valvular abnormalities.  5.  Type 2 diabetes 6.5.  She is encouraged to continue to increase her activity and continue with dietary modifications.  This continues to be followed by her PCP.  6.  Disposition patient return to clinic to see MD/APP in 4  months or sooner if needed Raylan Troiani, NP 07/22/2022, 12:29 PM

## 2022-07-22 ENCOUNTER — Encounter: Payer: Self-pay | Admitting: Cardiology

## 2022-07-22 ENCOUNTER — Ambulatory Visit: Payer: Medicare Other | Attending: Cardiology | Admitting: Cardiology

## 2022-07-22 VITALS — BP 136/78 | HR 86 | Ht 62.0 in | Wt 173.0 lb

## 2022-07-22 DIAGNOSIS — R600 Localized edema: Secondary | ICD-10-CM | POA: Diagnosis not present

## 2022-07-22 DIAGNOSIS — E1140 Type 2 diabetes mellitus with diabetic neuropathy, unspecified: Secondary | ICD-10-CM

## 2022-07-22 DIAGNOSIS — I2510 Atherosclerotic heart disease of native coronary artery without angina pectoris: Secondary | ICD-10-CM

## 2022-07-22 DIAGNOSIS — R0602 Shortness of breath: Secondary | ICD-10-CM | POA: Diagnosis not present

## 2022-07-22 DIAGNOSIS — E782 Mixed hyperlipidemia: Secondary | ICD-10-CM

## 2022-07-22 NOTE — Patient Instructions (Signed)
Medication Instructions:  Your Physician recommend you continue on your current medication as directed.    *If you need a refill on your cardiac medications before your next appointment, please call your pharmacy*   Lab Work: None ordered today   Testing/Procedures: None ordered today   Follow-Up: At [facility], you and your health needs are our priority.  As part of our continuing mission to provide you with exceptional heart care, we have created designated Provider Care Teams.  These Care Teams include your primary Cardiologist (physician) and Advanced Practice Providers (APPs -  Physician Assistants and Nurse Practitioners) who all work together to provide you with the care you need, when you need it.  We recommend signing up for the patient portal called "MyChart".  Sign up information is provided on this After Visit Summary.  MyChart is used to connect with patients for Virtual Visits (Telemedicine).  Patients are able to view lab/test results, encounter notes, upcoming appointments, etc.  Non-urgent messages can be sent to your provider as well.   To learn more about what you can do with MyChart, go to [email or web address].    Your next appointment:   4 month(s)  The format for your next appointment:   In Person  Provider:   Ida Rogue, MD

## 2022-08-03 DIAGNOSIS — Z961 Presence of intraocular lens: Secondary | ICD-10-CM | POA: Insufficient documentation

## 2022-08-03 DIAGNOSIS — H35371 Puckering of macula, right eye: Secondary | ICD-10-CM | POA: Insufficient documentation

## 2022-08-03 DIAGNOSIS — H43823 Vitreomacular adhesion, bilateral: Secondary | ICD-10-CM | POA: Insufficient documentation

## 2022-08-03 DIAGNOSIS — H43822 Vitreomacular adhesion, left eye: Secondary | ICD-10-CM | POA: Insufficient documentation

## 2022-08-03 DIAGNOSIS — E119 Type 2 diabetes mellitus without complications: Secondary | ICD-10-CM | POA: Insufficient documentation

## 2022-08-13 ENCOUNTER — Other Ambulatory Visit: Payer: Self-pay | Admitting: Cardiology

## 2022-08-30 ENCOUNTER — Ambulatory Visit: Payer: Medicare Other

## 2022-09-08 HISTORY — PX: PARS PLANA VITRECTOMY: SHX2166

## 2022-09-09 DIAGNOSIS — D3131 Benign neoplasm of right choroid: Secondary | ICD-10-CM | POA: Insufficient documentation

## 2022-09-09 DIAGNOSIS — L718 Other rosacea: Secondary | ICD-10-CM | POA: Insufficient documentation

## 2022-10-18 ENCOUNTER — Telehealth: Payer: Self-pay

## 2022-10-18 NOTE — Telephone Encounter (Signed)
Per pt phone call she wants to see Dr.Wohl. Pt was seen by Dr.Vanga but wasn't happy and went to see Dr. Haig Prophet at [facility]. Un happy with him she states she did a meet and greet with Dr. Allen Norris and she was told  she could be seen.  I just want to make sure it's ok to add her before I put pt on schedule.  Pt has Crohns

## 2022-10-25 ENCOUNTER — Ambulatory Visit: Payer: Medicare Other

## 2022-11-20 NOTE — Progress Notes (Unsigned)
Cardiology Office Note  Date:  11/21/2022   ID:  Nicole Bass, DOB 05-Jun-1951, MRN [id number]  PCP:  Marguarite Arbour, MD   Chief Complaint  Patient presents with   4 month follow up     Patient c/o shortness of breath with occasional irregular heart beats. Medications reviewed by the patient verbally.     HPI:  Nicole Bass is a 72 y.o.female patient with a history of  nonsmoker coronary artery atherosclerotic calcifications on chest CT,  hypertension,  rheumatoid arthritis,  Crohn's disease,  GERD.  admitted to [facility] from 3/28-3/31/2022 for syncope mechanical fall at home, hitting her face and chest wall on hardwood floors in 03/2020.  OSA on CPAP Who presents for f/u of her coronary artery calcification  Previously seen by [facility] cardiology January 2023 Last seen by myself in clinic 6/23 Seen by one of our providers 07/22/22  Echocardiogram  revealed LVEF 60-65%, no regional wall motion abnormalities, G1 DD, and no valvular abnormalities.   Eye surgery in Jan, sedentary after that Surgery "did not take" Hole in retina, has follow-up with her eye doctor at [facility] for further discussion  Attributes some of her gait instability to foot neuropathy  Reports having 1 episode of tachycardia, "jumping around", lasted for 3-4 minutes Resolved without intervention  Myalgias on crestor 10 No recent lipid panel through primary care, prior lipids total cholesterol 250 On medication total cholesterol down to 150  Concerned about family history, mother died at age 53 but she was diabetic, smoker with possibly high cholesterol  Declined EKG on today's visit  Other past medical history reviewed mechanical fall in the past August 2021, evaluated at [facility] with an overall unremarkable work-up with the exception of a large laceration of her nose. She later underwent surgical repair of her nasal septum and bony structures.   Chest CT for evaluation of sternal pain showed  left anterior descending, circumflex, and right coronary artery atherosclerotic calcifications, a punctate calcified granuloma in the right middle lobe, left upper lobe tiny punctate calcified granulomas present, and suspected distal bronchiectasis with mild airway plugging in the left lower lobe. She was evaluated by Dr. Karna Christmas.   Lexiscan Myoview on 05/04/2020.  gated review revealed LVEF 75%. SPECT imaging revealed no evidence for scar or ischemia. PFTs were normal.   admitted to [facility] from 3/28-3/31/2022 for syncope following recent back surgery.  Her potassium was 2.8, magnesium 1.6, D-dimer elevated, CTA negative for PE.  Chest CT did show prior granulomatous disease, pulmonary nodular changes in the right upper lobe consistent with prior fungal infection.   30-day Holter monitor was placed as outpatient, which revealed predominant sinus rhythm with a mean heart rate of 92 bpm. Sinus tachycardia was observed. There was 1 4 beat ventricular run, and premature atrial contractions. The patient was asymptomatic while wearing the Holter monitor.  Changed diet, less potatoes Chronic SOB, followed by pulmonary Has RA, neuropathy (prior hx of falls), DJD  Labs reviewed: A1C: 6.5 Total chol >250   PMH:   has a past medical history of Anemia, Anxiety, Arthritis, Collagen vascular disease, Crohn disease, Crohn's disease, Crohn's disease, DDD (degenerative disc disease), cervical, DDD (degenerative disc disease), cervical, DDD (degenerative disc disease), cervical (2003), DDD (degenerative disc disease), cervical, DDD (degenerative disc disease), lumbar, Degenerative disc disease, cervical, Depression, Diarrhea, Dyspnea, Fatty liver, GERD (gastroesophageal reflux disease), Hand weakness, Headache, History of chicken pox, History of claustrophobia, History of kidney stones, History of shingles, HOH (hard of hearing),  Immune deficiency disorder, Lymphatic disorder, Meniere's disease, Motion sickness,  Nasal fracture (03/27/2020), Neuromuscular disorder, Neuropathy, Peripheral neuropathy, Pyelonephritis, Sclerosing mesenteritis, UTI (urinary tract infection), Vertigo, and Wears dentures.  PSH:    Past Surgical History:  Procedure Laterality Date   ABDOMINAL HYSTERECTOMY     APPENDECTOMY     BACK SURGERY     x 3   CERVICAL SPINE SURGERY     metal plate   CESAREAN SECTION  1976 & 1989   X 2   CHOLECYSTECTOMY     CLOSED REDUCTION NASAL FRACTURE N/A 04/09/2020   Procedure: CLOSED REDUCTION NASAL FRACTURE;  Surgeon: Vernie Murders, MD;  Location: [facility];  Service: ENT;  Laterality: N/A;   COLON SURGERY  1999   removed 18 inches of colon,removed appendix   COLONOSCOPY     COLONOSCOPY WITH PROPOFOL N/A 08/02/2021   Procedure: COLONOSCOPY WITH PROPOFOL;  Surgeon: Regis Bill, MD;  Location: [facility];  Service: Endoscopy;  Laterality: N/A;   dental implants     3 teeth/ wears dentures   DILATION AND CURETTAGE OF UTERUS  2004   ELBOW FRACTURE SURGERY Left 2008   FIBEROPTIC BRONCHOSCOPY N/A 06/01/2020   Procedure: BEDSIDE BRONCHOSCOPY FIBEROPTIC;  Surgeon: Vida Rigger, MD;  Location: [facility] ORS;  Service: Thoracic;  Laterality: N/A;   FRACTURE SURGERY     JOINT REPLACEMENT Right 2015   partial knee replacement   KYPHOPLASTY     KYPHOPLASTY N/A 04/14/2015   Procedure: KYPHOPLASTY;  Surgeon: Kennedy Bucker, MD;  Location: [facility];  Service: Orthopedics;  Laterality: N/A;   KYPHOPLASTY N/A 06/04/2015   Procedure: KYPHOPLASTY L-5;  Surgeon: Kennedy Bucker, MD;  Location: [facility];  Service: Orthopedics;  Laterality: N/A;   KYPHOPLASTY     LUMBAR LAMINECTOMY FOR EPIDURAL ABSCESS N/A 10/12/2020   Procedure: SYNOVIAL CYST RESECTION;  Surgeon: Lucy Chris, MD;  Location: [facility];  Service: Neurosurgery;  Laterality: N/A;   MAXIMUM ACCESS (MAS) TRANSFORAMINAL LUMBAR INTERBODY FUSION (TLIF) 1 LEVEL Left 10/12/2020   Procedure: OPEN L5/S1 TRANSFORAMINAL LUMBAR INTERBODY  FUSION (TLIF) 1 LEVEL;  Surgeon: Lucy Chris, MD;  Location: [facility];  Service: Neurosurgery;  Laterality: Left;   MEDIAL PARTIAL KNEE REPLACEMENT Right 2015   NECK/PLATE  0076   OOPHORECTOMY Bilateral    ORIF NASAL FRACTURE N/A 04/09/2020   Procedure: OPEN REDUCTION INTERNAL FIXATION (ORIF) NASAL FRACTURE;  Surgeon: Vernie Murders, MD;  Location: [facility];  Service: ENT;  Laterality: N/A;   REPAIR EXTENSOR TENDON Left 01/08/2019   Procedure: Realignment  EXTENSOR TENDON;  Surgeon: Kennedy Bucker, MD;  Location: [facility];  Service: Orthopedics;  Laterality: Left;   REVERSE SHOULDER ARTHROPLASTY Right 04/18/2019   Procedure: REVERSE SHOULDER ARTHROPLASTY;  Surgeon: Christena Flake, MD;  Location: [facility];  Service: Orthopedics;  Laterality: Right;   SHOULDER ARTHROSCOPY WITH ROTATOR CUFF REPAIR AND SUBACROMIAL DECOMPRESSION Right 04/13/2016   Procedure: SHOULDER ARTHROSCOPY WITH ROTATOR CUFF REPAIR AND SUBACROMIAL DECOMPRESSION, release of long head biceps tendon;  Surgeon: Erin Sons, MD;  Location: [facility];  Service: Orthopedics;  Laterality: Right;   TOE SURGERY Right 2013   TONSILLECTOMY  1995   UPPER GI ENDOSCOPY     WRIST FRACTURE SURGERY Bilateral     Current Outpatient Medications  Medication Sig Dispense Refill   acetaminophen (TYLENOL) 650 MG CR tablet Take by mouth.     alendronate (FOSAMAX) 70 MG tablet Take 70 mg by mouth every Monday. Take with a full glass of water on an empty  stomach.     cetirizine (ZYRTEC) 10 MG tablet Take 10 mg by mouth daily.     chlorhexidine (PERIDEX) 0.12 % solution Use as directed 15 mLs in the mouth or throat as needed (mouth sores).     cholecalciferol (VITAMIN D3) 25 MCG (1000 UT) tablet Take 1,000 Units by mouth daily.     citalopram (CELEXA) 10 MG tablet Take 1 tablet by mouth daily.     cyanocobalamin (,VITAMIN B-12,) 1000 MCG/ML injection Inject 1,000 mcg into the muscle every 30 (thirty) days.     furosemide (LASIX) 20 MG tablet  TAKE 1 TABLET(20 MG) BY MOUTH DAILY 30 tablet 6   gabapentin (NEURONTIN) 100 MG capsule Take 100 mg by mouth every morning.     gabapentin (NEURONTIN) 400 MG capsule Take 400 mg by mouth at bedtime.     magnesium oxide (MAG-OX) 400 MG tablet Take 400 mg by mouth every Monday, Wednesday, and Friday.     Multiple Vitamin (MULTIVITAMIN WITH MINERALS) TABS tablet Take 1 tablet by mouth daily.     omeprazole (PRILOSEC) 40 MG capsule Take 40 mg by mouth daily before breakfast.     potassium chloride SA (KLOR-CON M) 20 MEQ tablet Take 20 mEq by mouth daily.     predniSONE (DELTASONE) 1 MG tablet Take 3 mg by mouth daily with breakfast.      Probiotic Product (PROBIOTIC PO) Take 1 capsule by mouth daily. Culturelle Probiotic     RESTASIS 0.05 % ophthalmic emulsion 1 drop 2 (two) times daily.     rOPINIRole (REQUIP) 1 MG tablet Take 1 mg by mouth at bedtime as needed (for restless legs).      sucralfate (CARAFATE) 1 g tablet Take 0.5 g by mouth 3 (three) times daily with meals as needed (stomach cramping/Crohn's symptoms).     Tofacitinib Citrate (XELJANZ) 5 MG TABS Take 1 tablet by mouth 2 (two) times daily.     traMADol (ULTRAM) 50 MG tablet Take 50 mg by mouth 2 (two) times daily as needed.     valACYclovir (VALTREX) 500 MG tablet Take 1 tablet twice daily for 5 days for flares. Start at first sign of symptoms. 30 tablet 5   ALPRAZolam (XANAX) 0.25 MG tablet Take 0.25 mg by mouth at bedtime as needed for anxiety. Take 1 tablet (0.25 mg total) by mouth once daily as needed for Sleep for up to 30 days (Patient not taking: Reported on 11/21/2022)     ALPRAZolam (XANAX) 0.5 MG tablet Take one tab 1 hour before scheduled procedure and one tab when you arrive in office (Patient not taking: Reported on 07/22/2022) 2 tablet 0   clindamycin (CLEOCIN T) 1 % external solution Apply to sores on scalp QD PRN. (Patient not taking: Reported on 11/21/2022) 60 mL 3   clindamycin (CLEOCIN) 300 MG capsule Take 300 mg by mouth 3  (three) times daily. (Patient not taking: Reported on 03/24/2022)     diazepam (VALIUM) 10 MG tablet Take 1 tablet (10 mg total) by mouth daily as needed (vaginal pain with intercourse, use before sexual activity per vagina). (Patient not taking: Reported on 11/21/2022) 30 tablet 0   estradiol (ESTRACE VAGINAL) 0.1 MG/GM vaginal cream Apply a pea size amount 3 nights a week (Patient not taking: Reported on 11/21/2022) 42.5 g 12   ketoconazole (NIZORAL) 2 % shampoo Shampoo into the scalp let sit 5-10 minutes then wash out. Use twice per week. (Patient not taking: Reported on 11/21/2022) 120 mL 5  lidocaine (XYLOCAINE) 2 % solution Use as directed 15 mLs in the mouth or throat as needed for mouth pain. (Patient not taking: Reported on 11/21/2022) 100 mL 1   No current facility-administered medications for this visit.    Allergies:   Amoxicillin-pot clavulanate, Ciprofloxacin, Levofloxacin, Methotrexate, Methotrexate derivatives, Other, Sulfa antibiotics, Rosuvastatin, Trelegy ellipta [fluticasone-umeclidin-vilant], Cefazolin, Cefprozil, Cephalosporins, Doxycycline, Enbrel [etanercept], Humira [adalimumab], Hydrocodone-acetaminophen, Lorabid [loracarbef], Meropenem, Oxycodone, Oxycodone-acetaminophen, and Primidone   Social History:  The patient  reports that she has never smoked. She has never used smokeless tobacco. She reports that she does not drink alcohol and does not use drugs.   Family History:   family history includes Colon cancer in her maternal grandfather; Diabetes in her mother; Emphysema in her father and mother.    Review of Systems: Review of Systems  Constitutional: Negative.   HENT: Negative.    Respiratory: Negative.    Cardiovascular: Negative.   Gastrointestinal: Negative.   Musculoskeletal: Negative.   Neurological: Negative.   Psychiatric/Behavioral: Negative.    All other systems reviewed and are negative.   PHYSICAL EXAM: VS:  BP 120/60 (BP Location: Left Arm, Patient  Position: Sitting, Cuff Size: Normal)   Pulse 68   Ht 5\' 2"  (1.575 m)   Wt 171 lb 6 oz (77.7 kg)   SpO2 97%   BMI 31.34 kg/m  , BMI Body mass index is 31.34 kg/m. Constitutional:  oriented to person, place, and time. No distress.  HENT:  Head: Grossly normal Eyes:  no discharge. No scleral icterus.  Neck: No JVD, no carotid bruits  Cardiovascular: Regular rate and rhythm, no murmurs appreciated Pulmonary/Chest: Clear to auscultation bilaterally, no wheezes or rails Abdominal: Soft.  no distension.  no tenderness.  Musculoskeletal: Normal range of motion Neurological:  normal muscle tone. Coordination normal. No atrophy Skin: Skin warm and dry Psychiatric: normal affect, pleasant  Recent Labs: No results found for requested labs within last 365 days.    Lipid Panel No results found for: "CHOL", "HDL", "LDLCALC", "TRIG"    Wt Readings from Last 3 Encounters:  11/21/22 171 lb 6 oz (77.7 kg)  07/22/22 173 lb (78.5 kg)  04/21/22 174 lb (78.9 kg)      ASSESSMENT AND PLAN:  Problem List Items Addressed This Visit       Cardiology Problems   HTN, goal below 140/80   Chronic diastolic CHF (congestive heart failure)     Other   Type 2 diabetes mellitus with diabetic neuropathy   Syncope   SOB (shortness of breath) on exertion - Primary   Other Visit Diagnoses     Coronary artery disease involving native coronary artery of native heart without angina pectoris       Mixed hyperlipidemia       Atherosclerosis of native coronary artery of native heart with stable angina pectoris       Bilateral leg edema         Coronary artery disease with stable angina Currently with no symptoms of angina. No further workup at this time. Continue current medication regimen. Previously did not tolerate Crestor 10 She will have repeat lipid panel with primary care and let us know when numbers are available for review Suspect she will need Zetia, retrial of alternate statin Prior total  cholesterol was 250  Diabetes type 2 with diabetic neuropathy We have encouraged continued exercise, careful diet management in an effort to lose weight.  Sedentary at baseline  Leg weakness/fall risk Recommended exercise program for  leg strengthening to minimize risk of falls Consider water walking, recumbent bike  Hyperlipidemia Did not tolerate Crestor 10 Plan as above, repeat lipid panel with primary care May need to can sit or alternate statin, the addition of Zetia She was not particularly interested in Repatha/Praluent    Total encounter time more than 30 minutes  Greater than 50% was spent in counseling and coordination of care with the patient    Signed, Dossie Arbour, M.D., Ph.D. [facility] [place], [place] [phone]

## 2022-11-21 ENCOUNTER — Encounter: Payer: Self-pay | Admitting: Cardiovascular Disease

## 2022-11-21 ENCOUNTER — Ambulatory Visit: Payer: Medicare Other | Attending: Cardiovascular Disease | Admitting: Cardiovascular Disease

## 2022-11-21 VITALS — BP 120/60 | HR 68 | Ht 62.0 in | Wt 171.4 lb

## 2022-11-21 DIAGNOSIS — R55 Syncope and collapse: Secondary | ICD-10-CM

## 2022-11-21 DIAGNOSIS — E782 Mixed hyperlipidemia: Secondary | ICD-10-CM | POA: Diagnosis not present

## 2022-11-21 DIAGNOSIS — I2510 Atherosclerotic heart disease of native coronary artery without angina pectoris: Secondary | ICD-10-CM | POA: Diagnosis not present

## 2022-11-21 DIAGNOSIS — R0602 Shortness of breath: Secondary | ICD-10-CM | POA: Diagnosis not present

## 2022-11-21 DIAGNOSIS — T466X5A Adverse effect of antihyperlipidemic and antiarteriosclerotic drugs, initial encounter: Secondary | ICD-10-CM

## 2022-11-21 DIAGNOSIS — I10 Essential (primary) hypertension: Secondary | ICD-10-CM

## 2022-11-21 DIAGNOSIS — R600 Localized edema: Secondary | ICD-10-CM

## 2022-11-21 DIAGNOSIS — I5032 Chronic diastolic (congestive) heart failure: Secondary | ICD-10-CM

## 2022-11-21 DIAGNOSIS — E1140 Type 2 diabetes mellitus with diabetic neuropathy, unspecified: Secondary | ICD-10-CM

## 2022-11-21 DIAGNOSIS — M791 Myalgia, unspecified site: Secondary | ICD-10-CM

## 2022-11-21 DIAGNOSIS — I25118 Atherosclerotic heart disease of native coronary artery with other forms of angina pectoris: Secondary | ICD-10-CM

## 2022-11-21 NOTE — Patient Instructions (Addendum)
Medication Instructions:  No changes  If you need a refill on your cardiac medications before your next appointment, please call your pharmacy.   Lab work:   Please call when labs are done with Dr. Judithann Sheen    Testing/Procedures: No new testing needed  Follow-Up: At [facility], you and your health needs are our priority.  As part of our continuing mission to provide you with exceptional heart care, we have created designated Provider Care Teams.  These Care Teams include your primary Cardiologist (physician) and Advanced Practice Providers (APPs -  Physician Assistants and Nurse Practitioners) who all work together to provide you with the care you need, when you need it.  You will need a follow up appointment in 12 months  Providers on your designated Care Team:   Nicolasa Ducking, NP Eula Listen, PA-C Cadence Fransico Michael, [place]  COVID-19 Vaccine Information can be found at: [email or web address] For questions related to vaccine distribution or appointments, please email [email or web address] or call [phone].

## 2022-11-24 ENCOUNTER — Ambulatory Visit: Payer: Medicare Other | Admitting: Gastroenterology

## 2022-11-24 ENCOUNTER — Encounter: Payer: Self-pay | Admitting: Gastroenterology

## 2022-11-24 VITALS — BP 118/68 | HR 99 | Temp 97.9°F | Wt 172.0 lb

## 2022-11-24 DIAGNOSIS — R197 Diarrhea, unspecified: Secondary | ICD-10-CM

## 2022-11-24 NOTE — Progress Notes (Signed)
Primary Care Physician: Marguarite ArbourSparks, Jeffrey D, MD  Primary Gastroenterologist:  Dr. Midge Miniumarren Karem Farha  Chief Complaint  Patient presents with   Follow-up    HPI: Nicole Bass is a 72 y.o. 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  The patient now comes in with worsening gas bloating and diarrhea.  The patient reports that she is not having the rectal bleeding.  The patient also denies any unexplained weight loss.  The patient has a diet with dairy products quite regularly.  Her diarrhea is explosive.  Past Medical History:  Diagnosis Date   Anemia    iron everyday, 3 iron infusions last one August 2020   Anxiety    nervous   Arthritis    rheumatoid   Collagen vascular disease    Crohn disease    Crohn's disease    Crohn's disease    DDD (degenerative disc disease), cervical    DDD (degenerative disc disease), cervical    DDD (degenerative disc disease), cervical 2003   DDD (degenerative disc disease), cervical    DDD (degenerative disc disease), lumbar    Degenerative disc disease, cervical    Depression    Diarrhea    Dyspnea    out of shape   Fatty liver    GERD (gastroesophageal reflux disease)    Hand weakness    left hand worse, small tremors   Headache    with accident   History of chicken pox    History of claustrophobia    History of kidney stones    History of shingles    HOH (hard of hearing)    left ear   Immune deficiency disorder    Lymphatic disorder    Meniere's disease    Motion sickness    Nasal fracture 03/27/2020   due to fall   Neuromuscular disorder    Neuropathy    legs   Peripheral neuropathy    Pyelonephritis    Sclerosing mesenteritis    UTI (urinary tract infection)    HO   Vertigo    can be accompanied by nausea   Wears dentures    upper dentures    Current Outpatient Medications  Medication Sig Dispense Refill   acetaminophen (TYLENOL) 650 MG CR tablet Take by mouth.      alendronate (FOSAMAX) 70 MG tablet Take 70 mg by mouth every Monday. Take with a full glass of water on an empty stomach.     ALPRAZolam (XANAX) 0.25 MG tablet Take 0.25 mg by mouth at bedtime as needed for anxiety. Take 1 tablet (0.25 mg total) by mouth once daily as needed for Sleep for up to 30 days (Patient not taking: Reported on 11/21/2022)     ALPRAZolam (XANAX) 0.5 MG tablet Take one tab 1 hour before scheduled procedure and one tab when you arrive in office (Patient not taking: Reported on 07/22/2022) 2 tablet 0   cetirizine (ZYRTEC) 10 MG tablet Take 10 mg by mouth daily.     chlorhexidine (PERIDEX) 0.12 % solution Use as directed 15 mLs in the mouth or throat as needed (mouth sores).     cholecalciferol (VITAMIN D3) 25 MCG (1000 UT) tablet Take 1,000 Units by mouth daily.     citalopram (CELEXA) 10 MG tablet Take 1 tablet by mouth daily.     clindamycin (CLEOCIN T) 1 % external solution Apply to sores on scalp QD PRN. (Patient not taking: Reported  on 11/21/2022) 60 mL 3   clindamycin (CLEOCIN) 300 MG capsule Take 300 mg by mouth 3 (three) times daily. (Patient not taking: Reported on 03/24/2022)     cyanocobalamin (,VITAMIN B-12,) 1000 MCG/ML injection Inject 1,000 mcg into the muscle every 30 (thirty) days.     diazepam (VALIUM) 10 MG tablet Take 1 tablet (10 mg total) by mouth daily as needed (vaginal pain with intercourse, use before sexual activity per vagina). (Patient not taking: Reported on 11/21/2022) 30 tablet 0   estradiol (ESTRACE VAGINAL) 0.1 MG/GM vaginal cream Apply a pea size amount 3 nights a week (Patient not taking: Reported on 11/21/2022) 42.5 g 12   furosemide (LASIX) 20 MG tablet TAKE 1 TABLET(20 MG) BY MOUTH DAILY 30 tablet 6   gabapentin (NEURONTIN) 100 MG capsule Take 100 mg by mouth every morning.     gabapentin (NEURONTIN) 400 MG capsule Take 400 mg by mouth at bedtime.     ketoconazole (NIZORAL) 2 % shampoo Shampoo into the scalp let sit 5-10 minutes then wash out. Use  twice per week. (Patient not taking: Reported on 11/21/2022) 120 mL 5   lidocaine (XYLOCAINE) 2 % solution Use as directed 15 mLs in the mouth or throat as needed for mouth pain. (Patient not taking: Reported on 11/21/2022) 100 mL 1   magnesium oxide (MAG-OX) 400 MG tablet Take 400 mg by mouth every Monday, Wednesday, and Friday.     Multiple Vitamin (MULTIVITAMIN WITH MINERALS) TABS tablet Take 1 tablet by mouth daily.     omeprazole (PRILOSEC) 40 MG capsule Take 40 mg by mouth daily before breakfast.     potassium chloride SA (KLOR-CON M) 20 MEQ tablet Take 20 mEq by mouth daily.     predniSONE (DELTASONE) 1 MG tablet Take 3 mg by mouth daily with breakfast.      Probiotic Product (PROBIOTIC PO) Take 1 capsule by mouth daily. Culturelle Probiotic     RESTASIS 0.05 % ophthalmic emulsion 1 drop 2 (two) times daily.     rOPINIRole (REQUIP) 1 MG tablet Take 1 mg by mouth at bedtime as needed (for restless legs).      sucralfate (CARAFATE) 1 g tablet Take 0.5 g by mouth 3 (three) times daily with meals as needed (stomach cramping/Crohn's symptoms).     Tofacitinib Citrate (XELJANZ) 5 MG TABS Take 1 tablet by mouth 2 (two) times daily.     traMADol (ULTRAM) 50 MG tablet Take 50 mg by mouth 2 (two) times daily as needed.     valACYclovir (VALTREX) 500 MG tablet Take 1 tablet twice daily for 5 days for flares. Start at first sign of symptoms. 30 tablet 5   No current facility-administered medications for this visit.    Allergies as of 11/24/2022 - Review Complete 11/24/2022  Allergen Reaction Noted   Amoxicillin-pot clavulanate Diarrhea 06/09/2022   Ciprofloxacin Itching and Rash 08/31/2016   Levofloxacin Hives and Rash 01/08/2014   Methotrexate  11/21/2017   Methotrexate derivatives  11/21/2017   Other Other (See Comments) and Rash 07/16/2013   Sulfa antibiotics Other (See Comments) 07/16/2013   Rosuvastatin Swelling and Other (See Comments) 03/24/2022   Trelegy ellipta  [fluticasone-umeclidin-vilant]  09/24/2020   Cefazolin Itching and Rash 04/13/2015   Cefprozil Rash 07/16/2013   Cephalosporins Itching and Rash 04/13/2015   Doxycycline Rash 03/02/2021   Enbrel [etanercept] Itching and Rash 04/13/2015   Humira [adalimumab] Itching and Rash 08/04/2015   Hydrocodone-acetaminophen Itching and Other (See Comments) 04/29/2015   Lorabid [loracarbef]  Itching and Rash 04/13/2015   Meropenem Itching and Rash 04/13/2015   Oxycodone Itching, Other (See Comments), and Rash 09/06/2017   Oxycodone-acetaminophen Itching 09/06/2017   Primidone Itching and Rash 04/13/2015    ROS:  General: Negative for anorexia, weight loss, fever, chills, fatigue, weakness. ENT: Negative for hoarseness, difficulty swallowing , nasal congestion. CV: Negative for chest pain, angina, palpitations, dyspnea on exertion, peripheral edema.  Respiratory: Negative for dyspnea at rest, dyspnea on exertion, cough, sputum, wheezing.  GI: See history of present illness. GU:  Negative for dysuria, hematuria, urinary incontinence, urinary frequency, nocturnal urination.  Endo: Negative for unusual weight change.    Physical Examination:   BP 118/68   Pulse 99   Temp 97.9 F (36.6 C) (Oral)   Wt 172 lb (78 kg)   BMI 31.46 kg/m   General: Well-nourished, well-developed in no acute distress.  Eyes: No icterus. Conjunctivae pink. Neuro: Alert and oriented x 3.  Grossly intact. Skin: Warm and dry, no jaundice.   Psych: Alert and cooperative, normal mood and affect.  Labs:    Imaging Studies: No results found.  Assessment and Plan:   Nicole Bass is a 72 y.o. y/o female who comes in today with a history of increased diarrhea with increased gas and explosive diarrhea.  The patient will have her stool sent off for fecal calprotectin to see if this is a flare of her Crohn's disease.  The patient will also try to avoid milk for 1 week to see if that helps her symptoms.  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  The patient has been explained the plan and agrees with it.     Midge Minium, MD. Clementeen Graham    Note: This dictation was prepared with Dragon dictation along with smaller phrase technology. Any transcriptional errors that result from this process are unintentional.

## 2022-11-28 ENCOUNTER — Telehealth: Payer: Self-pay | Admitting: Gastroenterology

## 2022-11-28 ENCOUNTER — Ambulatory Visit
Admission: RE | Admit: 2022-11-28 | Discharge: 2022-11-28 | Disposition: A | Discharge status: Home / Self Care | Payer: Medicare Other | Source: Ambulatory Visit | Attending: Internal Medicine | Admitting: Internal Medicine

## 2022-11-28 DIAGNOSIS — Z1231 Encounter for screening mammogram for malignant neoplasm of breast: Secondary | ICD-10-CM | POA: Insufficient documentation

## 2022-11-28 NOTE — Telephone Encounter (Signed)
Pt would like to know if Dr. Servando Snare would take on seeing her husband. He now seen by [facility]  Dr. Mia Creek and he very unhappy with MD.  Per pt she uses to see Dr. Mia Creek as well and switch over to Dr. Servando Snare. She's very pleased with the change and think the change would benefit her husband as now his care is very unsatisfying .

## 2022-11-29 ENCOUNTER — Telehealth: Payer: Self-pay

## 2022-11-29 NOTE — Telephone Encounter (Signed)
Pt would like to know test result from samples she drop off last week.Marland Kitchen

## 2022-11-30 LAB — CALPROTECTIN, FECAL: Calprotectin, Fecal: 28 ug/g (ref 0–120)

## 2022-12-06 ENCOUNTER — Other Ambulatory Visit: Payer: Self-pay

## 2022-12-06 ENCOUNTER — Emergency Department: Payer: Medicare Other

## 2022-12-06 ENCOUNTER — Inpatient Hospital Stay
Admission: EM | Admit: 2022-12-06 | Discharge: 2022-12-08 | DRG: 872 | Disposition: A | Discharge status: Home Health | Payer: Medicare Other | Attending: Student in an Organized Health Care Education/Training Program | Admitting: Student in an Organized Health Care Education/Training Program

## 2022-12-06 DIAGNOSIS — K760 Fatty (change of) liver, not elsewhere classified: Secondary | ICD-10-CM | POA: Diagnosis present

## 2022-12-06 DIAGNOSIS — M069 Rheumatoid arthritis, unspecified: Secondary | ICD-10-CM | POA: Diagnosis present

## 2022-12-06 DIAGNOSIS — R55 Syncope and collapse: Secondary | ICD-10-CM | POA: Diagnosis not present

## 2022-12-06 DIAGNOSIS — I130 Hypertensive heart and chronic kidney disease with heart failure and stage 1 through stage 4 chronic kidney disease, or unspecified chronic kidney disease: Secondary | ICD-10-CM | POA: Diagnosis present

## 2022-12-06 DIAGNOSIS — F418 Other specified anxiety disorders: Secondary | ICD-10-CM | POA: Diagnosis present

## 2022-12-06 DIAGNOSIS — R911 Solitary pulmonary nodule: Secondary | ICD-10-CM | POA: Diagnosis present

## 2022-12-06 DIAGNOSIS — H8109 Meniere's disease, unspecified ear: Secondary | ICD-10-CM | POA: Diagnosis present

## 2022-12-06 DIAGNOSIS — N1831 Chronic kidney disease, stage 3a: Secondary | ICD-10-CM | POA: Diagnosis present

## 2022-12-06 DIAGNOSIS — R531 Weakness: Principal | ICD-10-CM

## 2022-12-06 DIAGNOSIS — Z6830 Body mass index (BMI) 30.0-30.9, adult: Secondary | ICD-10-CM

## 2022-12-06 DIAGNOSIS — S62102A Fracture of unspecified carpal bone, left wrist, initial encounter for closed fracture: Secondary | ICD-10-CM | POA: Diagnosis present

## 2022-12-06 DIAGNOSIS — Z87442 Personal history of urinary calculi: Secondary | ICD-10-CM

## 2022-12-06 DIAGNOSIS — Z825 Family history of asthma and other chronic lower respiratory diseases: Secondary | ICD-10-CM

## 2022-12-06 DIAGNOSIS — N390 Urinary tract infection, site not specified: Secondary | ICD-10-CM | POA: Diagnosis present

## 2022-12-06 DIAGNOSIS — Z90710 Acquired absence of both cervix and uterus: Secondary | ICD-10-CM

## 2022-12-06 DIAGNOSIS — Z882 Allergy status to sulfonamides status: Secondary | ICD-10-CM

## 2022-12-06 DIAGNOSIS — R6520 Severe sepsis without septic shock: Secondary | ICD-10-CM | POA: Diagnosis present

## 2022-12-06 DIAGNOSIS — Z96611 Presence of right artificial shoulder joint: Secondary | ICD-10-CM | POA: Diagnosis present

## 2022-12-06 DIAGNOSIS — Z981 Arthrodesis status: Secondary | ICD-10-CM

## 2022-12-06 DIAGNOSIS — N3000 Acute cystitis without hematuria: Secondary | ICD-10-CM

## 2022-12-06 DIAGNOSIS — Z800 Family history of malignant neoplasm of digestive organs: Secondary | ICD-10-CM

## 2022-12-06 DIAGNOSIS — E860 Dehydration: Secondary | ICD-10-CM | POA: Diagnosis present

## 2022-12-06 DIAGNOSIS — J9811 Atelectasis: Secondary | ICD-10-CM | POA: Diagnosis present

## 2022-12-06 DIAGNOSIS — Z7982 Long term (current) use of aspirin: Secondary | ICD-10-CM

## 2022-12-06 DIAGNOSIS — Z7952 Long term (current) use of systemic steroids: Secondary | ICD-10-CM

## 2022-12-06 DIAGNOSIS — Z8619 Personal history of other infectious and parasitic diseases: Secondary | ICD-10-CM

## 2022-12-06 DIAGNOSIS — K5010 Crohn's disease of large intestine without complications: Secondary | ICD-10-CM | POA: Diagnosis present

## 2022-12-06 DIAGNOSIS — N179 Acute kidney failure, unspecified: Secondary | ICD-10-CM | POA: Diagnosis present

## 2022-12-06 DIAGNOSIS — E669 Obesity, unspecified: Secondary | ICD-10-CM | POA: Diagnosis present

## 2022-12-06 DIAGNOSIS — Z79899 Other long term (current) drug therapy: Secondary | ICD-10-CM

## 2022-12-06 DIAGNOSIS — D649 Anemia, unspecified: Secondary | ICD-10-CM | POA: Diagnosis present

## 2022-12-06 DIAGNOSIS — I959 Hypotension, unspecified: Secondary | ICD-10-CM | POA: Diagnosis present

## 2022-12-06 DIAGNOSIS — Z888 Allergy status to other drugs, medicaments and biological substances status: Secondary | ICD-10-CM

## 2022-12-06 DIAGNOSIS — Z881 Allergy status to other antibiotic agents status: Secondary | ICD-10-CM

## 2022-12-06 DIAGNOSIS — R0781 Pleurodynia: Secondary | ICD-10-CM | POA: Diagnosis present

## 2022-12-06 DIAGNOSIS — E876 Hypokalemia: Secondary | ICD-10-CM | POA: Diagnosis present

## 2022-12-06 DIAGNOSIS — Z7983 Long term (current) use of bisphosphonates: Secondary | ICD-10-CM

## 2022-12-06 DIAGNOSIS — Z96651 Presence of right artificial knee joint: Secondary | ICD-10-CM | POA: Diagnosis present

## 2022-12-06 DIAGNOSIS — I5A Non-ischemic myocardial injury (non-traumatic): Secondary | ICD-10-CM | POA: Diagnosis present

## 2022-12-06 DIAGNOSIS — I5032 Chronic diastolic (congestive) heart failure: Secondary | ICD-10-CM | POA: Diagnosis present

## 2022-12-06 DIAGNOSIS — K50119 Crohn's disease of large intestine with unspecified complications: Secondary | ICD-10-CM

## 2022-12-06 DIAGNOSIS — R7989 Other specified abnormal findings of blood chemistry: Secondary | ICD-10-CM

## 2022-12-06 DIAGNOSIS — Z9049 Acquired absence of other specified parts of digestive tract: Secondary | ICD-10-CM

## 2022-12-06 DIAGNOSIS — A419 Sepsis, unspecified organism: Secondary | ICD-10-CM | POA: Diagnosis present

## 2022-12-06 DIAGNOSIS — Z833 Family history of diabetes mellitus: Secondary | ICD-10-CM

## 2022-12-06 DIAGNOSIS — E66811 Obesity, class 1: Secondary | ICD-10-CM | POA: Diagnosis present

## 2022-12-06 DIAGNOSIS — Z885 Allergy status to narcotic agent status: Secondary | ICD-10-CM

## 2022-12-06 DIAGNOSIS — G629 Polyneuropathy, unspecified: Secondary | ICD-10-CM | POA: Diagnosis present

## 2022-12-06 DIAGNOSIS — W19XXXA Unspecified fall, initial encounter: Secondary | ICD-10-CM | POA: Diagnosis present

## 2022-12-06 DIAGNOSIS — I10 Essential (primary) hypertension: Secondary | ICD-10-CM | POA: Diagnosis present

## 2022-12-06 LAB — COMPREHENSIVE METABOLIC PANEL
ALT: 31 U/L (ref 0–44)
AST: 46 U/L — ABNORMAL HIGH (ref 15–41)
Albumin: 3.2 g/dL — ABNORMAL LOW (ref 3.5–5.0)
Alkaline Phosphatase: 64 U/L (ref 38–126)
Anion gap: 11 (ref 5–15)
BUN: 19 mg/dL (ref 8–23)
CO2: 27 mmol/L (ref 22–32)
Calcium: 8.2 mg/dL — ABNORMAL LOW (ref 8.9–10.3)
Chloride: 100 mmol/L (ref 98–111)
Creatinine, Ser: 1.4 mg/dL — ABNORMAL HIGH (ref 0.44–1.00)
GFR, Estimated: 40 mL/min — ABNORMAL LOW (ref >60)
Glucose, Bld: 105 mg/dL — ABNORMAL HIGH (ref 70–99)
Potassium: 2.9 mmol/L — ABNORMAL LOW (ref 3.5–5.1)
Sodium: 138 mmol/L (ref 135–145)
Total Bilirubin: 1.1 mg/dL (ref 0.3–1.2)
Total Protein: 6.4 g/dL — ABNORMAL LOW (ref 6.5–8.1)

## 2022-12-06 LAB — URINALYSIS, COMPLETE (UACMP) WITH MICROSCOPIC
Bilirubin Urine: NEGATIVE
Glucose, UA: NEGATIVE mg/dL
Hgb urine dipstick: NEGATIVE
Ketones, ur: NEGATIVE mg/dL
Nitrite: NEGATIVE
Protein, ur: 30 mg/dL — AB
Specific Gravity, Urine: 1.024 (ref 1.005–1.030)
pH: 6 (ref 5.0–8.0)

## 2022-12-06 LAB — CBC
HCT: 31.2 % — ABNORMAL LOW (ref 36.0–46.0)
Hemoglobin: 9.9 g/dL — ABNORMAL LOW (ref 12.0–15.0)
MCH: 31.5 pg (ref 26.0–34.0)
MCHC: 31.7 g/dL (ref 30.0–36.0)
MCV: 99.4 fL (ref 80.0–100.0)
Platelets: 174 10*3/uL (ref 150–400)
RBC: 3.14 MIL/uL — ABNORMAL LOW (ref 3.87–5.11)
RDW: 13.5 % (ref 11.5–15.5)
WBC: 11.8 10*3/uL — ABNORMAL HIGH (ref 4.0–10.5)
nRBC: 0 % (ref 0.0–0.2)

## 2022-12-06 LAB — LIPID PANEL
Cholesterol: 136 mg/dL (ref 0–200)
HDL: 64 mg/dL (ref >40)
LDL Cholesterol: 58 mg/dL (ref 0–99)
Total CHOL/HDL Ratio: 2.1 RATIO
Triglycerides: 70 mg/dL (ref <150)
VLDL: 14 mg/dL (ref 0–40)

## 2022-12-06 LAB — LACTIC ACID, PLASMA
Lactic Acid, Venous: 3 mmol/L (ref 0.5–1.9)
Lactic Acid, Venous: 3.6 mmol/L (ref 0.5–1.9)
Lactic Acid, Venous: 2.8 mmol/L (ref 0.5–1.9)
Lactic Acid, Venous: 1.5 mmol/L (ref 0.5–1.9)

## 2022-12-06 LAB — TROPONIN I (HIGH SENSITIVITY)
Troponin I (High Sensitivity): 52 ng/L — ABNORMAL HIGH (ref <18)
Troponin I (High Sensitivity): 103 ng/L (ref <18)
Troponin I (High Sensitivity): 149 ng/L (ref <18)
Troponin I (High Sensitivity): 275 ng/L (ref <18)

## 2022-12-06 LAB — CK: Total CK: 219 U/L (ref 38–234)

## 2022-12-06 LAB — MAGNESIUM: Magnesium: 1.4 mg/dL — ABNORMAL LOW (ref 1.7–2.4)

## 2022-12-06 LAB — BRAIN NATRIURETIC PEPTIDE: B Natriuretic Peptide: 102.5 pg/mL — ABNORMAL HIGH (ref 0.0–100.0)

## 2022-12-06 LAB — PHOSPHORUS: Phosphorus: 2 mg/dL — ABNORMAL LOW (ref 2.5–4.6)

## 2022-12-06 LAB — PROCALCITONIN: Procalcitonin: 7.13 ng/mL

## 2022-12-06 MED ORDER — POTASSIUM CHLORIDE CRYS ER 20 MEQ PO TBCR: 60.0000 meq | EXTENDED_RELEASE_TABLET | Freq: Once | ORAL | Status: DC

## 2022-12-06 MED ORDER — GABAPENTIN 400 MG PO CAPS
400.0000 mg | ORAL_CAPSULE | Freq: Every day | ORAL | Status: DC
  Administered 2022-12-06 – 2022-12-07 (×2): 400 mg via ORAL
  Filled 2022-12-06 (×2): qty 1

## 2022-12-06 MED ORDER — ASPIRIN 81 MG PO TBEC
81.0000 mg | DELAYED_RELEASE_TABLET | Freq: Every day | ORAL | Status: DC
  Administered 2022-12-06 – 2022-12-08 (×3): 81 mg via ORAL
  Filled 2022-12-06 (×3): qty 1

## 2022-12-06 MED ORDER — MAGNESIUM SULFATE 2 GM/50ML IV SOLN
2.0000 g | Freq: Once | INTRAVENOUS | Status: AC
  Administered 2022-12-06: 2 g via INTRAVENOUS
  Filled 2022-12-06: qty 50

## 2022-12-06 MED ORDER — PREDNISOLONE ACETATE 1 % OP SUSP
1.0000 [drp] | Freq: Four times a day (QID) | OPHTHALMIC | Status: DC
  Administered 2022-12-07 – 2022-12-08 (×4): 1 [drp] via OPHTHALMIC
  Filled 2022-12-06: qty 1

## 2022-12-06 MED ORDER — GABAPENTIN 100 MG PO CAPS
100.0000 mg | ORAL_CAPSULE | ORAL | Status: DC
  Administered 2022-12-07 – 2022-12-08 (×2): 100 mg via ORAL
  Filled 2022-12-06 (×2): qty 1

## 2022-12-06 MED ORDER — PANTOPRAZOLE SODIUM 40 MG PO TBEC
40.0000 mg | DELAYED_RELEASE_TABLET | Freq: Every day | ORAL | Status: DC
  Administered 2022-12-07 – 2022-12-08 (×2): 40 mg via ORAL
  Filled 2022-12-06 (×2): qty 1

## 2022-12-06 MED ORDER — DORZOLAMIDE HCL 2 % OP SOLN
1.0000 [drp] | Freq: Every day | OPHTHALMIC | Status: DC
  Administered 2022-12-07 – 2022-12-08 (×2): 1 [drp] via OPHTHALMIC
  Filled 2022-12-06: qty 10

## 2022-12-06 MED ORDER — ENOXAPARIN SODIUM 40 MG/0.4ML IJ SOSY
0.5000 mg/kg | PREFILLED_SYRINGE | INTRAMUSCULAR | Status: DC
  Administered 2022-12-06 – 2022-12-07 (×2): 37.5 mg via SUBCUTANEOUS
  Filled 2022-12-06 (×2): qty 0.4

## 2022-12-06 MED ORDER — SODIUM CHLORIDE 0.9 % IV BOLUS: 1000.0000 mL | Freq: Once | INTRAVENOUS | Status: DC

## 2022-12-06 MED ORDER — SODIUM CHLORIDE 0.9 % IV BOLUS
1000.0000 mL | Freq: Once | INTRAVENOUS | Status: AC
  Administered 2022-12-06: 1000 mL via INTRAVENOUS

## 2022-12-06 MED ORDER — SODIUM CHLORIDE 0.9 % IV BOLUS: 500.0000 mL | Freq: Once | INTRAVENOUS | Status: DC

## 2022-12-06 MED ORDER — SODIUM CHLORIDE 0.9 % IV SOLN
1.0000 g | Freq: Three times a day (TID) | INTRAVENOUS | Status: DC
  Filled 2022-12-06: qty 5

## 2022-12-06 MED ORDER — SODIUM CHLORIDE 0.9 % IV SOLN
2.0000 g | Freq: Once | INTRAVENOUS | Status: AC
  Administered 2022-12-06: 2 g via INTRAVENOUS
  Filled 2022-12-06: qty 10

## 2022-12-06 MED ORDER — FENTANYL CITRATE PF 50 MCG/ML IJ SOSY
50.0000 ug | PREFILLED_SYRINGE | Freq: Once | INTRAMUSCULAR | Status: AC
  Administered 2022-12-06: 50 ug via INTRAVENOUS
  Filled 2022-12-06: qty 1

## 2022-12-06 MED ORDER — SODIUM CHLORIDE 0.9 % IV SOLN
1.0000 g | Freq: Three times a day (TID) | INTRAVENOUS | Status: DC
  Administered 2022-12-06 – 2022-12-08 (×5): 1 g via INTRAVENOUS
  Filled 2022-12-06 (×2): qty 5
  Filled 2022-12-06 (×2): qty 1
  Filled 2022-12-06 (×3): qty 5

## 2022-12-06 MED ORDER — ONDANSETRON HCL 4 MG/2ML IJ SOLN
4.0000 mg | Freq: Once | INTRAMUSCULAR | Status: AC
  Administered 2022-12-06: 4 mg via INTRAVENOUS
  Filled 2022-12-06: qty 2

## 2022-12-06 MED ORDER — SACCHAROMYCES BOULARDII 250 MG PO CAPS
250.0000 mg | ORAL_CAPSULE | Freq: Every day | ORAL | Status: DC
  Administered 2022-12-07 – 2022-12-08 (×2): 250 mg via ORAL
  Filled 2022-12-06 (×2): qty 1

## 2022-12-06 MED ORDER — POTASSIUM CHLORIDE CRYS ER 20 MEQ PO TBCR
40.0000 meq | EXTENDED_RELEASE_TABLET | Freq: Once | ORAL | Status: AC
  Administered 2022-12-06: 40 meq via ORAL
  Filled 2022-12-06: qty 2

## 2022-12-06 MED ORDER — CITALOPRAM HYDROBROMIDE 20 MG PO TABS
10.0000 mg | ORAL_TABLET | Freq: Every day | ORAL | Status: DC
  Administered 2022-12-06 – 2022-12-08 (×3): 10 mg via ORAL
  Filled 2022-12-06 (×3): qty 1

## 2022-12-06 MED ORDER — PREDNISONE 1 MG PO TABS
3.0000 mg | ORAL_TABLET | Freq: Every day | ORAL | Status: DC
  Administered 2022-12-07 – 2022-12-08 (×2): 3 mg via ORAL
  Filled 2022-12-06 (×3): qty 3

## 2022-12-06 MED ORDER — POTASSIUM CHLORIDE 10 MEQ/100ML IV SOLN
10.0000 meq | INTRAVENOUS | Status: AC
  Administered 2022-12-06 (×2): 10 meq via INTRAVENOUS
  Filled 2022-12-06 (×2): qty 100

## 2022-12-06 MED ORDER — SODIUM CHLORIDE 0.9 % IV SOLN: INTRAVENOUS | Status: DC

## 2022-12-06 MED ORDER — IOHEXOL 350 MG/ML SOLN
60.0000 mL | Freq: Once | INTRAVENOUS | Status: AC | PRN
  Administered 2022-12-06: 60 mL via INTRAVENOUS

## 2022-12-06 MED ORDER — HYDROCORTISONE SOD SUC (PF) 100 MG IJ SOLR
100.0000 mg | Freq: Two times a day (BID) | INTRAMUSCULAR | Status: AC
  Administered 2022-12-06 – 2022-12-07 (×2): 100 mg via INTRAVENOUS
  Filled 2022-12-06 (×2): qty 2

## 2022-12-06 MED ORDER — POTASSIUM PHOSPHATES 15 MMOLE/5ML IV SOLN
15.0000 mmol | Freq: Once | INTRAVENOUS | Status: AC
  Administered 2022-12-06: 15 mmol via INTRAVENOUS
  Filled 2022-12-06: qty 5

## 2022-12-06 MED ORDER — HYDROCODONE-ACETAMINOPHEN 5-325 MG PO TABS
1.0000 | ORAL_TABLET | Freq: Four times a day (QID) | ORAL | Status: DC | PRN
  Administered 2022-12-06 – 2022-12-07 (×3): 1 via ORAL
  Filled 2022-12-06 (×3): qty 1

## 2022-12-06 NOTE — ED Triage Notes (Signed)
Pt from home reports having a fall Saturday and going to a hospital in [place]. Pt has a fracture to L wrist. Pt reports L rib pain and weakness. At pt home she took her blood pressure was reportedly low in the 70's systolic. 107/65 here. Pt also repots she could not sleep and had episodes of shivering all night.

## 2022-12-06 NOTE — ED Provider Notes (Signed)
[facility] Provider Note    Event Date/Time   First MD Initiated Contact with Patient 12/06/22 [phone]     (approximate)  History   Chief Complaint: Dizziness  HPI  Nicole Bass is a 72 y.o. female with a past medical history of anemia, anxiety, Crohn's, neuropathy, vertigo, presents to the emergency department for low blood pressure, weakness and continued pain across her left side.  Cording to the patient 3 days ago patient had a fall, landed on her left side.  Patient states she went to a local ER in [facility].  Stated she had CT scans performed of her head neck chest and x-rays of her left arm.  States that she had a "bone chip" of her left wrist where she is currently wearing a removable wrist brace.  States she had bruising over her ribs but they did not see any fractures.  Patient denies any shortness of breath but states continued pain mostly to the left lateral chest.  Patient states her main complaint today is feeling dizzy and lightheaded.  She states EMS arrived and checked her blood pressure and it was low so she came to the emergency department for evaluation.  Blood pressure in the emergency department currently 107/65 prior to intervention.  Physical Exam   Triage Vital Signs: ED Triage Vitals [12/06/22 0654]  Enc Vitals Group     BP 107/65     Pulse Rate (!) 108     Resp 20     Temp 98.2 F (36.8 C)     Temp Source Oral     SpO2 100 %     Weight 169 lb (76.7 kg)     Height  (1.575 m)     Head Circumference      Peak Flow      Pain Score 7     Pain Loc      Pain Edu?      Excl. in GC?     Most recent vital signs: Vitals:   12/06/22 0654 12/06/22 0730  BP: 107/65   Pulse: (!) 108 91  Resp: 20   Temp: 98.2 F (36.8 C)   SpO2: 100% 96%    General: Awake, no distress.  CV:  Good peripheral perfusion.  Regular rate and rhythm  Resp:  Normal effort.  Equal breath sounds bilaterally.  Mod tenderness to  palpation of the left lateral lower ribs. Abd:  No distention.  Soft, nontender.  No rebound or guarding.   ED Results / Procedures / Treatments   EKG  EKG viewed and interpreted by myself shows sinus tachycardia 113 bpm with a narrow QRS, normal axis, normal intervals, no concerning ST changes.  RADIOLOGY  Chest x-ray viewed and interpreted by myself does not appear to show any consolidation on my evaluation. Radiology is read the chest x-ray is negative for acute abnormality continued right upper lobe nodules.   MEDICATIONS ORDERED IN ED: Medications  sodium chloride 0.9 % bolus 1,000 mL (has no administration in time range)  ondansetron (ZOFRAN) injection 4 mg (has no administration in time range)  fentaNYL (SUBLIMAZE) injection 50 mcg (has no administration in time range)     IMPRESSION / MDM / ASSESSMENT AND PLAN / ED COURSE  I reviewed the triage vital signs and the nursing notes.  Patient's presentation is most consistent with acute presentation with potential threat to life or bodily function.  Patient presents emergency department for weakness/dizziness as well as  continued left lateral rib/chest wall pain.  Patient's blood pressure reportedly low by EMS in the 80s systolic currently 107/65 without intervention.  Patient's main complaint is pain to her rib cage and left wrist.  We will obtain a chest x-ray to evaluate for any pneumothorax or displaced rib fracture.  Will treat the patient's pain we will IV hydrate we will check labs.  Will also treat the patient's nausea while awaiting results.  Patient agreeable to plan.  Patient's troponin is mildly elevated around 50, CBC shows no significant findings besides mild anemia, creatinine slightly elevated to 1.4 consistent more with acute kidney injury.  Patient's BNP elevated to 102 lactic acid mildly elevated as well.  Given the patient's acute renal insufficiency continued borderline hypotension around 100 systolic we will  admit to the hospital service for further workup and treatment.  Given the patient's mild troponin elevation with left-sided chest pain although very likely chest wall pain we will obtain a CTA as a precaution to rule out PE or other intrathoracic abnormality.  CTA is negative for any significant finding.  Patient to be admitted to the hospital service for further workup and treatment.  FINAL CLINICAL IMPRESSION(S) / ED DIAGNOSES   Weakness Nausea Contusion Hypotension Acute renal insufficiency  Note:  This document was prepared using Dragon voice recognition software and may include unintentional dictation errors.   Minna Antis, MD 12/06/22 [phone]

## 2022-12-06 NOTE — H&P (Signed)
History and Physical    Nicole Bass ZOX:[id number] DOB: 12-27-1950 DOA: 12/06/2022  Referring MD/NP/PA:   PCP: Marguarite Arbour, MD   Patient coming from:  The patient is coming from home.     Chief Complaint: dizziness, weakness, left rib cage pain  HPI: Nicole Bass is a 72 y.o. female with medical history significant of HTN, dCHF, depression with anxiety, CKD-3a, anemia, obesity obesity BMI 30.91, kidney stone, Crohn's disease and rheumatoid arthritis (on chronic low-dose prednisone, Harriette Ohara), sclerosing mesenteritis, pyelonephritis, recent fall with left wrist fracture, who presents with dizziness, weakness, left rib cage pain.  Pt states that she had a fall, landed on her left side 3 days ago. She was seen in local ER in [facility]. She had CT scans performed of her head, neck, chest and x-rays of her left arm. States that she had a "bone chip" of her left wrist where she is currently wearing a removable wrist brace.  States she had bruising over her ribs but they did not see any fractures.  She continues to have left lateral rib cage pain, which is constant, sharp, moderate, nonradiating, aggravated by movement.  Patient does not have chest pain, shortness breath or cough.  No fever or chills.  Patient has nausea, no vomiting, diarrhea or abdominal pain.  Denies symptoms of UTI. Today, she has weakness, dizziness and lightheadedness. She states EMS arrived and checked her blood pressure which was 70/45 per her husband. Pt was given 3L of NS bolus. Her Bp has improved to 103/53 now.  Data reviewed independently and ED Course: pt was found to have WBC 11.8, positive urinalysis (hyze appearance, large amount of leukocyte, rare bacteria, WBC 21-50, squamous cell 613), BNP 102, trop 52 --> 103, worsening renal function, potassium 2.9, magnesium of 1.4, phosphorus of 2.0, lactic acid 3.6, 0.0, procalcitonin 7.13, temperature normal, heart rate 114, RR 22, oxygen saturation  94% on room air.  Chest x-ray showed chronic interstitial coarsening.  CTA with significant breathing motion effect, but it is negative for PE.  Patient is admitted to PCU as inpatient.  CTA: significant breathing motion, No segmental or larger pulmonary embolism identified. There is some enlargement of the main pulmonary artery. Please correlate for pulmonary artery hypertension.   Dependent bandlike opacity seen at the bases. Atelectasis favored. Stable nodular infiltrative areas in the right upper lobe these of the differential would have been stable by report since at least November 2022. Recommend continued surveillance in 6 months  EKG: I have personally reviewed.  Sinus rhythm, QTc 464, early R wave progression, low voltage.   Review of Systems:   General: no fevers, chills, no body weight gain, has fatigue HEENT: no blurry vision, hearing changes or sore throat Respiratory: no dyspnea, coughing, wheezing CV: no chest pain, no palpitations GI: no nausea, vomiting, abdominal pain, diarrhea, constipation GU: no dysuria, burning on urination, increased urinary frequency, hematuria  Ext: no leg edema Neuro: no unilateral weakness, numbness, or tingling, no vision change or hearing loss.  Has dizziness and lightheadedness. Skin: no rash, no skin tear. MSK: No muscle spasm, no deformity, no limitation of range of movement in spin. Has left lateral rib cage pain. Heme: No easy bruising.  Travel history: No recent long distant travel.   Allergy:  Allergies  Allergen Reactions   Amoxicillin-Pot Clavulanate Diarrhea    Severe diarrhea   Ciprofloxacin Itching and Rash   Levofloxacin Hives and Rash   Methotrexate     Other  reaction(s): Other (See Comments) Mouth Ulcers/[place] Mouth and throat ulcers   Methotrexate Derivatives     Mouth and throat ulcers   Other Other (See Comments) and Rash    Other reaction(s): Other (See Comments), Unknown Per pts MD she is unable to take  this medication because it is contraindicated with her other medications.   Per pts MD she is unable to take this medication because it is contraindicated with her other medications.   Other reaction(s): Other (See Comments), Unknown Other reaction(s): Other (See Comments), Unknown Per pts MD she is unable to take this medication because it is contraindicated with her other medications.   Per pts MD she is unable to take this medication because it is contraindicated with her other medications.   Other reaction(s): Other (See Comments), Unknown Other reaction(s): Other (See Comments), Unknown Per pts MD she is unable to take this medication because it is contraindicated with her  Per pts MD she is unable to take this medication because it is contraindicated with her other medications.   Other reaction(s): Other (See Comments) Other reaction(s): Other (See Comments), Unknown Per pts MD she is unable to take this medication because it is contraindicated with her other medications.   Per pts MD she is unable to take this medication because it is contraindicated with her other medications.   Other reaction(s): Other (See Comments), Unknown Other reaction(s): Other (See Comments), Unknown Per pts MD she is unable to take this medication because it is contraindicated with her other medications.   Per pts MD she is unable to take this medication because it is contraindicated with her other medications.   Other reaction(s): Other (See Comments), Unknown Other reaction(s): Other (See Comments), Unknown Per pts MD she is unable to take this medication because it is contraindicated with her  Per pts MD she is unable to take this medication because it is contraindicated with her other medications.     Sulfa Antibiotics Other (See Comments)    Per pts MD she is unable to take this medication because it is contraindicated with her other medications.  NOTE: Pt has never had reaction to these or  sulfasalazine    Per pts MD she is unable to take this medication because it is contraindicated with her other medications.    Per pts MD she is unable to take this medication because it is contraindicated with her other medications.    Other reaction(s): Other (See Comments), Unknown  Per pts MD she is unable to take this medication because it is contraindicated with her other medications.   Rosuvastatin Swelling and Other (See Comments)    And cramps   Trelegy Ellipta [Fluticasone-Umeclidin-Vilant]     Laryngitis    Cefazolin Itching and Rash   Cefprozil Rash   Cephalosporins Itching and Rash   Doxycycline Rash    Pt tolerates Z pack and amoxicillin   Enbrel [Etanercept] Itching and Rash   Humira [Adalimumab] Itching and Rash   Hydrocodone-Acetaminophen Itching and Other (See Comments)    Itching only   Lorabid [Loracarbef] Itching and Rash   Meropenem Itching and Rash   Oxycodone Itching, Other (See Comments) and Rash    Other reaction(s): Other (See Comments)   Oxycodone-Acetaminophen Itching   Primidone Itching and Rash    Past Medical History:  Diagnosis Date   Anemia    iron everyday, 3 iron infusions last one August 2020   Anxiety    nervous   Arthritis  rheumatoid   Collagen vascular disease    Crohn disease    Crohn's disease    Crohn's disease    DDD (degenerative disc disease), cervical    DDD (degenerative disc disease), cervical    DDD (degenerative disc disease), cervical 2003   DDD (degenerative disc disease), cervical    DDD (degenerative disc disease), lumbar    Degenerative disc disease, cervical    Depression    Diarrhea    Dyspnea    out of shape   Fatty liver    GERD (gastroesophageal reflux disease)    Hand weakness    left hand worse, small tremors   Headache    with accident   History of chicken pox    History of claustrophobia    History of kidney stones    History of shingles    HOH (hard of hearing)    left ear   Immune  deficiency disorder    Lymphatic disorder    Meniere's disease    Motion sickness    Nasal fracture 03/27/2020   due to fall   Neuromuscular disorder    Neuropathy    legs   Peripheral neuropathy    Pyelonephritis    Sclerosing mesenteritis    UTI (urinary tract infection)    HO   Vertigo    can be accompanied by nausea   Wears dentures    upper dentures    Past Surgical History:  Procedure Laterality Date   ABDOMINAL HYSTERECTOMY     APPENDECTOMY     BACK SURGERY     x 3   CERVICAL SPINE SURGERY     metal plate   CESAREAN SECTION  1976 & 1989   X 2   CHOLECYSTECTOMY     CLOSED REDUCTION NASAL FRACTURE N/A 04/09/2020   Procedure: CLOSED REDUCTION NASAL FRACTURE;  Surgeon: Vernie Murders, MD;  Location: [facility];  Service: ENT;  Laterality: N/A;   COLON SURGERY  1999   removed 18 inches of colon,removed appendix   COLONOSCOPY     COLONOSCOPY WITH PROPOFOL N/A 08/02/2021   Procedure: COLONOSCOPY WITH PROPOFOL;  Surgeon: Regis Bill, MD;  Location: [facility];  Service: Endoscopy;  Laterality: N/A;   dental implants     3 teeth/ wears dentures   DILATION AND CURETTAGE OF UTERUS  2004   ELBOW FRACTURE SURGERY Left 2008   FIBEROPTIC BRONCHOSCOPY N/A 06/01/2020   Procedure: BEDSIDE BRONCHOSCOPY FIBEROPTIC;  Surgeon: Vida Rigger, MD;  Location: [facility];  Service: Thoracic;  Laterality: N/A;   FRACTURE SURGERY     JOINT REPLACEMENT Right 2015   partial knee replacement   KYPHOPLASTY     KYPHOPLASTY N/A 04/14/2015   Procedure: KYPHOPLASTY;  Surgeon: Kennedy Bucker, MD;  Location: [facility];  Service: Orthopedics;  Laterality: N/A;   KYPHOPLASTY N/A 06/04/2015   Procedure: KYPHOPLASTY L-5;  Surgeon: Kennedy Bucker, MD;  Location: [facility];  Service: Orthopedics;  Laterality: N/A;   KYPHOPLASTY     LUMBAR LAMINECTOMY FOR EPIDURAL ABSCESS N/A 10/12/2020   Procedure: SYNOVIAL CYST RESECTION;  Surgeon: Lucy Chris, MD;  Location: [facility];  Service:  Neurosurgery;  Laterality: N/A;   MAXIMUM ACCESS (MAS) TRANSFORAMINAL LUMBAR INTERBODY FUSION (TLIF) 1 LEVEL Left 10/12/2020   Procedure: OPEN L5/S1 TRANSFORAMINAL LUMBAR INTERBODY FUSION (TLIF) 1 LEVEL;  Surgeon: Lucy Chris, MD;  Location: [facility];  Service: Neurosurgery;  Laterality: Left;   MEDIAL PARTIAL KNEE REPLACEMENT Right 2015   NECK/PLATE  1610   OOPHORECTOMY  Bilateral    ORIF NASAL FRACTURE N/A 04/09/2020   Procedure: OPEN REDUCTION INTERNAL FIXATION (ORIF) NASAL FRACTURE;  Surgeon: Vernie Murders, MD;  Location: [facility];  Service: ENT;  Laterality: N/A;   REPAIR EXTENSOR TENDON Left 01/08/2019   Procedure: Realignment  EXTENSOR TENDON;  Surgeon: Kennedy Bucker, MD;  Location: [facility];  Service: Orthopedics;  Laterality: Left;   REVERSE SHOULDER ARTHROPLASTY Right 04/18/2019   Procedure: REVERSE SHOULDER ARTHROPLASTY;  Surgeon: Christena Flake, MD;  Location: [facility];  Service: Orthopedics;  Laterality: Right;   SHOULDER ARTHROSCOPY WITH ROTATOR CUFF REPAIR AND SUBACROMIAL DECOMPRESSION Right 04/13/2016   Procedure: SHOULDER ARTHROSCOPY WITH ROTATOR CUFF REPAIR AND SUBACROMIAL DECOMPRESSION, release of long head biceps tendon;  Surgeon: Erin Sons, MD;  Location: [facility];  Service: Orthopedics;  Laterality: Right;   TOE SURGERY Right 2013   TONSILLECTOMY  1995   UPPER GI ENDOSCOPY     WRIST FRACTURE SURGERY Bilateral     Social History:  reports that she has never smoked. She has never used smokeless tobacco. She reports that she does not drink alcohol and does not use drugs.  Family History:  Family History  Problem Relation Age of Onset   Diabetes Mother    Emphysema Mother    Emphysema Father    Colon cancer Maternal Grandfather    Breast cancer Neg Hx    Ovarian cancer Neg Hx      Prior to Admission medications   Medication Sig Start Date End Date Taking? Authorizing Provider  atropine 1 % ophthalmic solution Place into the right eye. 09/08/22  Yes  [provider]  dorzolamide (TRUSOPT) 2 % ophthalmic solution Apply to eye. 11/23/22 11/23/23 Yes [provider]  HYDROcodone-acetaminophen (NORCO/VICODIN) 5-325 MG tablet Take 1 tablet by mouth every 6 (six) hours as needed. 12/04/22 12/09/22 Yes [provider]  ketorolac (TORADOL) 10 MG tablet Take 10 mg by mouth every 6 (six) hours as needed for moderate pain. 12/04/22 12/09/22 Yes [provider]  nystatin cream (MYCOSTATIN) Apply topically. 07/20/22 07/20/23 Yes [provider]  prednisoLONE acetate (PRED FORTE) 1 % ophthalmic suspension Prednisolone Taper in the right eye: One drop every hour while awake for 1 week, One drop every 2 hours while awake for 1 week, One drop every 3 hours while awake for 1 week, One drop 4 times a day - stay at this dose 11/23/22  Yes [provider]  acetaminophen (TYLENOL) 650 MG CR tablet Take by mouth.    [provider]  alendronate (FOSAMAX) 70 MG tablet Take 70 mg by mouth every Monday. Take with a full glass of water on an empty stomach.    [provider]  ALPRAZolam Prudy Feeler) 0.25 MG tablet Take 0.25 mg by mouth at bedtime as needed for anxiety. Take 1 tablet (0.25 mg total) by mouth once daily as needed for Sleep for up to 30 days Patient not taking: Reported on 11/21/2022    [provider]  ALPRAZolam Prudy Feeler) 0.5 MG tablet Take one tab 1 hour before scheduled procedure and one tab when you arrive in office Patient not taking: Reported on 07/22/2022 08/27/21   Georgiana Spinner, NP  cetirizine (ZYRTEC) 10 MG tablet Take 10 mg by mouth daily.    [provider]  chlorhexidine (PERIDEX) 0.12 % solution Use as directed 15 mLs in the mouth or throat as needed (mouth sores).    [provider]  cholecalciferol (VITAMIN D3) 25 MCG (1000 UT) tablet  Take 1,000 Units by mouth daily.    [provider]  citalopram (CELEXA) 10 MG tablet Take 1 tablet by mouth daily.  07/20/22 07/20/23  [provider]  clindamycin (CLEOCIN T) 1 % external solution Apply to sores on scalp QD PRN. Patient not taking: Reported on 11/21/2022 06/28/21   Deirdre Evener, MD  clindamycin (CLEOCIN) 300 MG capsule Take 300 mg by mouth 3 (three) times daily. Patient not taking: Reported on 03/24/2022 04/26/21   [provider]  cyanocobalamin (,VITAMIN B-12,) 1000 MCG/ML injection Inject 1,000 mcg into the muscle every 30 (thirty) days.    [provider]  diazepam (VALIUM) 10 MG tablet Take 1 tablet (10 mg total) by mouth daily as needed (vaginal pain with intercourse, use before sexual activity per vagina). Patient not taking: Reported on 11/21/2022 02/22/19   Vanna Scotland, MD  estradiol (ESTRACE VAGINAL) 0.1 MG/GM vaginal cream Apply a pea size amount 3 nights a week Patient not taking: Reported on 11/21/2022 09/15/21   Vanna Scotland, MD  furosemide (LASIX) 20 MG tablet TAKE 1 TABLET(20 MG) BY MOUTH DAILY 08/16/22   Debbe Odea, MD  gabapentin (NEURONTIN) 100 MG capsule Take 100 mg by mouth every morning. 08/20/19   [provider]  gabapentin (NEURONTIN) 400 MG capsule Take 400 mg by mouth at bedtime.    [provider]  ketoconazole (NIZORAL) 2 % shampoo Shampoo into the scalp let sit 5-10 minutes then wash out. Use twice per week. Patient not taking: Reported on 11/21/2022 10/11/21   Deirdre Evener, MD  lidocaine (XYLOCAINE) 2 % solution Use as directed 15 mLs in the mouth or throat as needed for mouth pain. Patient not taking: Reported on 11/21/2022 01/26/22   Midge Minium, MD  magnesium oxide (MAG-OX) 400 MG tablet Take 400 mg by mouth every Monday, Wednesday, and Friday.    [provider]  Multiple Vitamin (MULTIVITAMIN WITH MINERALS) TABS tablet Take 1 tablet by mouth daily.    [provider]  nortriptyline (PAMELOR) 10 MG capsule Take by mouth.    [provider]  omeprazole (PRILOSEC) 40 MG capsule Take 40  mg by mouth daily before breakfast.    [provider]  potassium chloride SA (KLOR-CON M) 20 MEQ tablet Take 20 mEq by mouth daily.    [provider]  predniSONE (DELTASONE) 1 MG tablet Take 3 mg by mouth daily with breakfast.     [provider]  Probiotic Product (PROBIOTIC PO) Take 1 capsule by mouth daily. Culturelle Probiotic    [provider]  RESTASIS 0.05 % ophthalmic emulsion 1 drop 2 (two) times daily. 09/29/21   [provider]  rOPINIRole (REQUIP) 1 MG tablet Take 1 mg by mouth at bedtime as needed (for restless legs).     [provider]  sucralfate (CARAFATE) 1 g tablet Take 0.5 g by mouth 3 (three) times daily with meals as needed (stomach cramping/Crohn's symptoms).    [provider]  Tofacitinib Citrate (XELJANZ) 5 MG TABS Take 1 tablet by mouth 2 (two) times daily. 02/28/22   [provider]  traMADol (ULTRAM) 50 MG tablet Take 50 mg by mouth 2 (two) times daily as needed. 03/18/22   [provider]  valACYclovir (VALTREX) 500 MG tablet Take 1 tablet twice daily for 5 days for flares. Start at first sign of symptoms. 11/25/21   Deirdre Evener, MD    Physical Exam: Vitals:   12/06/22 1530 12/06/22 1600 12/06/22 1700  12/06/22 1818  BP: (!) 90/51 (!) 106/56 (!) 109/50 (!) 120/51  Pulse: (!) 101 (!) 101 (!) 116 (!) 107  Resp:    18  Temp:    98.6 F (37 C)  TempSrc:      SpO2: 98% 95% 97% 94%  Weight:      Height:       General: Not in acute distress HEENT:       Eyes: PERRL, EOMI, no scleral icterus.       ENT: No discharge from the ears and nose, no pharynx injection, no tonsillar enlargement.        Neck: No JVD, no bruit, no mass felt. Heme: No neck lymph node enlargement. Cardiac: S1/S2, RRR, No murmurs, No gallops or rubs. Respiratory: No rales, wheezing, rhonchi or rubs. GI: Soft, nondistended, nontender, no rebound pain, no organomegaly, BS present. GU: No hematuria Ext: No  pitting leg edema bilaterally. 1+DP/PT pulse bilaterally. Musculoskeletal: has tenderness in left lateral rib cage. Has left wrist brace on Skin: No rashes.  Neuro: Alert, oriented X3, cranial nerves II-XII grossly intact, moves all extremities normally. Psych: Patient is not psychotic, no suicidal or hemocidal ideation.  Labs on Admission: I have personally reviewed following labs and imaging studies  CBC: Recent Labs  Lab 12/06/22 0729  WBC 11.8*  HGB 9.9*  HCT 31.2*  MCV 99.4  PLT 174   Basic Metabolic Panel: Recent Labs  Lab 12/06/22 0729  NA 138  K 2.9*  CL 100  CO2 27  GLUCOSE 105*  BUN 19  CREATININE 1.40*  CALCIUM 8.2*  MG 1.4*  PHOS 2.0*   GFR: Estimated Creatinine Clearance: 35.8 mL/min (A) (by C-G formula based on SCr of 1.4 mg/dL (H)). Liver Function Tests: Recent Labs  Lab 12/06/22 0729  AST 46*  ALT 31  ALKPHOS 64  BILITOT 1.1  PROT 6.4*  ALBUMIN 3.2*   No results for input(s): "LIPASE", "AMYLASE" in the last 168 hours. No results for input(s): "AMMONIA" in the last 168 hours. Coagulation Profile: No results for input(s): "INR", "PROTIME" in the last 168 hours. Cardiac Enzymes: Recent Labs  Lab 12/06/22 0729  CKTOTAL 219   BNP (last 3 results) No results for input(s): "PROBNP" in the last 8760 hours. HbA1C: No results for input(s): "HGBA1C" in the last 72 hours. CBG: No results for input(s): "GLUCAP" in the last 168 hours. Lipid Profile: Recent Labs    12/06/22 1106  CHOL 136  HDL 64  LDLCALC 58  TRIG 70  CHOLHDL 2.1   Thyroid Function Tests: No results for input(s): "TSH", "T4TOTAL", "FREET4", "T3FREE", "THYROIDAB" in the last 72 hours. Anemia Panel: No results for input(s): "VITAMINB12", "FOLATE", "FERRITIN", "TIBC", "IRON", "RETICCTPCT" in the last 72 hours. Urine analysis:    Component Value Date/Time   COLORURINE YELLOW (A) 12/06/2022 0729   APPEARANCEUR HAZY (A) 12/06/2022 0729   APPEARANCEUR Clear 03/17/2022 0911    LABSPEC 1.024 12/06/2022 0729   LABSPEC 1.009 10/19/2013 1359   PHURINE 6.0 12/06/2022 0729   GLUCOSEU NEGATIVE 12/06/2022 0729   GLUCOSEU Negative 10/19/2013 1359   HGBUR NEGATIVE 12/06/2022 0729   BILIRUBINUR NEGATIVE 12/06/2022 0729   BILIRUBINUR Negative 03/17/2022 0911   BILIRUBINUR Negative 10/19/2013 1359   KETONESUR NEGATIVE 12/06/2022 0729   PROTEINUR 30 (A) 12/06/2022 0729   NITRITE NEGATIVE 12/06/2022 0729   LEUKOCYTESUR LARGE (A) 12/06/2022 0729   LEUKOCYTESUR Trace 10/19/2013 1359   Sepsis Labs: (procalcitonin:4,lacticidven:4) )No results found for this or any previous visit (from  the past 240 hour(s)).   Radiological Exams on Admission: CT Angio Chest PE W and/or Wo Contrast  Result Date: 12/06/2022 CLINICAL DATA:  Recent fall.  Chest pain. EXAM: CT ANGIOGRAPHY CHEST WITH CONTRAST TECHNIQUE: Multidetector CT imaging of the chest was performed using the standard protocol during bolus administration of intravenous contrast. Multiplanar CT image reconstructions and MIPs were obtained to evaluate the vascular anatomy. RADIATION DOSE REDUCTION: This exam was performed according to the departmental dose-optimization program which includes automated exposure control, adjustment of the mA and/or kV according to patient size and/or use of iterative reconstruction technique. CONTRAST:  60mL OMNIPAQUE IOHEXOL 350 MG/ML SOLN COMPARISON:  X-ray 12/06/2022.  CT 10/19/2021 FINDINGS: Cardiovascular: Heart is nonenlarged. No pericardial effusion. There are some coronary artery calcifications are identified. There is some enlargement of the main pulmonary artery. Please correlate for pulmonary artery hypertension. Breathing motion identified. This limits evaluation of emboli particularly nondiagnostic for small and peripheral emboli. No segmental or larger pulmonary embolism identified. Mediastinum/Nodes: Slightly patulous thoracic esophagus. Preserved thyroid gland. No specific abnormal  lymph node enlargement identified in the axillary region, hilum or mediastinum. Lungs/Pleura: No pleural effusion or pneumothorax. Mild lung base opacities. Atelectasis is favored over infiltrate but recommend follow-up. There is also some ill-defined nodular opacities in the right upper lobe identified once again are similar to prior measuring up to 12 mm when adjusting for technique. Upper Abdomen: Adrenal glands are preserved in the upper abdomen. Musculoskeletal: Right shoulder arthroplasty. Multifocal degenerative changes along the spine. Fixation hardware along the lower cervical spine. Old upper sternal fracture. Augmentation cement along vertebral bodies. Review of the MIP images confirms the above findings. IMPRESSION: Significant breathing motion. No segmental or larger pulmonary embolism identified. There is some enlargement of the main pulmonary artery. Please correlate for pulmonary artery hypertension. Dependent bandlike opacity seen at the bases. Atelectasis favored. Stable nodular infiltrative areas in the right upper lobe these of the differential would have been stable by report since at least November 2022. Recommend continued surveillance in 6 months Aortic Atherosclerosis (ICD10-I70.0). Electronically Signed   By: Karen Kays M.D.   On: 12/06/2022 11:07   DG Chest Portable 1 View  Result Date: 12/06/2022 CLINICAL DATA:  Left chest pain. EXAM: PORTABLE CHEST 1 VIEW COMPARISON:  11/09/2020 FINDINGS: The cardio pericardial silhouette is enlarged. Interstitial markings are diffusely coarsened with chronic features. Nodular densities in the right upper lobe compatible with the nodularity seen on chest CT 10/19/2021. No pulmonary edema or focal lung consolidation. Bones are diffusely demineralized. Telemetry leads overlie the chest. IMPRESSION: Chronic interstitial coarsening without acute cardiopulmonary findings. Right upper lobe nodules better characterized on chest CT 10/19/2021.  Electronically Signed   By: Kennith Center M.D.   On: 12/06/2022 07:53      Assessment/Plan Principal Problem:   Severe sepsis Active Problems:   UTI (urinary tract infection)   Hypotension   Left wrist fracture   Myocardial injury   Acute renal failure superimposed on stage 3a chronic kidney disease   Hypokalemia   Hypomagnesemia   Hypophosphatemia   Chronic diastolic CHF (congestive heart failure)   HTN (hypertension)   Normocytic anemia   Depression with anxiety   Crohn's colitis   RA (rheumatoid arthritis)   Obesity (BMI 30.0-34.9)   Lung nodule   Assessment and Plan:  Severe sepsis due to UTI (urinary tract infection): Patient has positive urinalysis for UTI.  She meets criteria for severe sepsis with heart rate 114, RR 22.  Lactic acid at  3.6--> 3.0.  Procalcitonin 7.13.  Initially hypotensive which responded to IV fluid resuscitation.  -Admitted to PCU as inpatient -Aztreonam IV -Follow-up blood culture and urine culture -Trend lactic acid level -IV fluid: 2 L normal saline, Naima 100 cc/h  Hypotension: Initial blood pressure 86/79, which improved to 103/53, after giving 2 L normal saline bolus.  Likely due to sepsis, dehydration and continuation of Lasix -IV fluid as above -Hold Lasix -Solu-Cortef 100 mg 3 times daily, x 2 dose  Left wrist fracture -As needed Tylenol, Norco  Myocardial injury: Troponin level 52, 103, 149.  No chest pain -Aspirin 81 mg daily -Trend troponin -Check A1c, FLP  Acute renal failure superimposed on stage 3a chronic kidney disease: Baseline creatinine 1.0 on 09/30/2021.  Creatinine is 1.40, BUN 19, GFR 40.  Likely due to multifactorial etiology, including UTI, possible ATN due to hypotension, dehydration with continuation of Lasix. -IV fluid as above -Hold Lasix, ketorolac  Hypokalemia, Hypomagnesemia, Hypophosphatemia: Potassium 2.9, magnesium 1.4, phosphorus 2.0 -Repleted potassium, magnesium and phosphorus -Follow-up labs in  the morning  Chronic diastolic CHF (congestive heart failure): 2D echo on 05/17/2022 showed EF of 60-85% with grade 1 diastolic dysfunction.  Patient does not have leg edema JVD.  CHF seems to be compensated.  BNP 102. -Hold Lasix due to hypotension  HTN (hypertension) -Hold Lasix due to hypotension  Normocytic anemia: Hemoglobin stable 9.9 (8.6 on 11/12/2020) -Follow-up repeat CBC  Depression with anxiety -Continue home medications  Crohn's colitis and RA(rheumatoid arthritis): pt is taking prednisone 3 mg daily. Pt is not taking Harriette Ohara now -Give Solu-Cortef 100 mg twice daily x 2 doses -Continue home prednisone tomorrow  Obesity (BMI 30.0-34.9): Body weight 76.7 kg, BMI 30.91 -Encourage losing weight -Healthy diet and exercise  Lung nodule: Incidental findings of CTA  - Recommend continued surveillance in 6 months with PCP   DVT ppx: SQ Lovenox  Code Status: Full code  Family Communication:  Yes, patient's son and husband    at bed side.    Disposition Plan:  Anticipate discharge back to previous environment  Consults called:  none  Admission status and Level of care: Progressive:     as inpt      Dispo: The patient is from: Home              Anticipated d/c is to: Home              Anticipated d/c date is: 2 days              Patient currently is not medically stable to d/c.    Severity of Illness:  The appropriate patient status for this patient is INPATIENT. Inpatient status is judged to be reasonable and necessary in order to provide the required intensity of service to ensure the patient's safety. The patient's presenting symptoms, physical exam findings, and initial radiographic and laboratory data in the context of their chronic comorbidities is felt to place them at high risk for further clinical deterioration. Furthermore, it is not anticipated that the patient will be medically stable for discharge from the hospital within 2 midnights of admission.   * I  certify that at the point of admission it is my clinical judgment that the patient will require inpatient hospital care spanning beyond 2 midnights from the point of admission due to high intensity of service, high risk for further deterioration and high frequency of surveillance required.*       Date of Service 12/06/2022  Lorretta Harp [facility]   If 7PM-7AM, please contact night-coverage [email or web address] 12/06/2022, 6:49 PM

## 2022-12-06 NOTE — Progress Notes (Signed)
Pharmacy Antibiotic Note  Nicole Bass is a 72 y.o. female admitted on 12/06/2022 with UTI.  Pharmacy has been consulted for aztreonam dosing.  Pt with allergies to beta-lactams and fluoroquinolones.  Has tolerated aztreonam in the past.  CrCl 35.8.  Plan: Aztreonam 1 gm IV Q8H  Height:  (157.5 cm) Weight: 76.7 kg (169 lb) IBW/kg (Calculated) : 50.1  Temp (24hrs), Avg:98.3 F (36.8 C), Min:98.2 F (36.8 C), Max:98.4 F (36.9 C)  Recent Labs  Lab 12/06/22 0729  WBC 11.8*  CREATININE 1.40*    Estimated Creatinine Clearance: 35.8 mL/min (A) (by C-G formula based on SCr of 1.4 mg/dL (H)).    Allergies  Allergen Reactions   Amoxicillin-Pot Clavulanate Diarrhea    Severe diarrhea   Ciprofloxacin Itching and Rash   Levofloxacin Hives and Rash   Methotrexate     Other reaction(s): Other (See Comments) Mouth Ulcers/Sores Mouth and throat ulcers   Methotrexate Derivatives     Mouth and throat ulcers   Other Other (See Comments) and Rash    Other reaction(s): Other (See Comments), Unknown Per pts MD she is unable to take this medication because it is contraindicated with her other medications.   Per pts MD she is unable to take this medication because it is contraindicated with her other medications.   Other reaction(s): Other (See Comments), Unknown Other reaction(s): Other (See Comments), Unknown Per pts MD she is unable to take this medication because it is contraindicated with her other medications.   Per pts MD she is unable to take this medication because it is contraindicated with her other medications.   Other reaction(s): Other (See Comments), Unknown Other reaction(s): Other (See Comments), Unknown Per pts MD she is unable to take this medication because it is contraindicated with her  Per pts MD she is unable to take this medication because it is contraindicated with her other medications.   Other reaction(s): Other (See Comments) Other reaction(s): Other  (See Comments), Unknown Per pts MD she is unable to take this medication because it is contraindicated with her other medications.   Per pts MD she is unable to take this medication because it is contraindicated with her other medications.   Other reaction(s): Other (See Comments), Unknown Other reaction(s): Other (See Comments), Unknown Per pts MD she is unable to take this medication because it is contraindicated with her other medications.   Per pts MD she is unable to take this medication because it is contraindicated with her other medications.   Other reaction(s): Other (See Comments), Unknown Other reaction(s): Other (See Comments), Unknown Per pts MD she is unable to take this medication because it is contraindicated with her  Per pts MD she is unable to take this medication because it is contraindicated with her other medications.     Sulfa Antibiotics Other (See Comments)    Per pts MD she is unable to take this medication because it is contraindicated with her other medications.  NOTE: Pt has never had reaction to these or sulfasalazine    Per pts MD she is unable to take this medication because it is contraindicated with her other medications.    Per pts MD she is unable to take this medication because it is contraindicated with her other medications.    Other reaction(s): Other (See Comments), Unknown  Per pts MD she is unable to take this medication because it is contraindicated with her other medications.   Rosuvastatin Swelling and Other (See Comments)  And cramps   Trelegy Ellipta [Fluticasone-Umeclidin-Vilant]     Laryngitis    Cefazolin Itching and Rash   Cefprozil Rash   Cephalosporins Itching and Rash   Doxycycline Rash    Pt tolerates Z pack and amoxicillin   Enbrel [Etanercept] Itching and Rash   Humira [Adalimumab] Itching and Rash   Hydrocodone-Acetaminophen Itching and Other (See Comments)    Itching only   Lorabid [Loracarbef] Itching and Rash    Meropenem Itching and Rash   Oxycodone Itching, Other (See Comments) and Rash    Other reaction(s): Other (See Comments)   Oxycodone-Acetaminophen Itching   Primidone Itching and Rash    Antimicrobials this admission: 4/23 aztreonam >>   Dose adjustments this admission: N/A  Microbiology results: 4/23 BCx: pending 4/23 UCx: Dr. Clyde Lundborg states he will order    Thank you for allowing pharmacy to be a part of this patient's care.  Manfred Shirts 12/06/2022 11:13 AM

## 2022-12-07 DIAGNOSIS — R7989 Other specified abnormal findings of blood chemistry: Secondary | ICD-10-CM

## 2022-12-07 DIAGNOSIS — R6520 Severe sepsis without septic shock: Secondary | ICD-10-CM

## 2022-12-07 DIAGNOSIS — N179 Acute kidney failure, unspecified: Secondary | ICD-10-CM

## 2022-12-07 DIAGNOSIS — A419 Sepsis, unspecified organism: Secondary | ICD-10-CM | POA: Diagnosis not present

## 2022-12-07 DIAGNOSIS — R531 Weakness: Secondary | ICD-10-CM | POA: Diagnosis not present

## 2022-12-07 DIAGNOSIS — N390 Urinary tract infection, site not specified: Secondary | ICD-10-CM

## 2022-12-07 DIAGNOSIS — R55 Syncope and collapse: Secondary | ICD-10-CM

## 2022-12-07 LAB — URINE CULTURE

## 2022-12-07 LAB — BASIC METABOLIC PANEL
Anion gap: 5 (ref 5–15)
BUN: 16 mg/dL (ref 8–23)
CO2: 21 mmol/L — ABNORMAL LOW (ref 22–32)
Calcium: 7 mg/dL — ABNORMAL LOW (ref 8.9–10.3)
Chloride: 114 mmol/L — ABNORMAL HIGH (ref 98–111)
Creatinine, Ser: 0.97 mg/dL (ref 0.44–1.00)
GFR, Estimated: >60 mL/min (ref >60)
Glucose, Bld: 121 mg/dL — ABNORMAL HIGH (ref 70–99)
Potassium: 3.7 mmol/L (ref 3.5–5.1)
Sodium: 140 mmol/L (ref 135–145)

## 2022-12-07 LAB — CBC
HCT: 27.5 % — ABNORMAL LOW (ref 36.0–46.0)
Hemoglobin: 8.6 g/dL — ABNORMAL LOW (ref 12.0–15.0)
MCH: 31.9 pg (ref 26.0–34.0)
MCHC: 31.3 g/dL (ref 30.0–36.0)
MCV: 101.9 fL — ABNORMAL HIGH (ref 80.0–100.0)
Platelets: 157 10*3/uL (ref 150–400)
RBC: 2.7 MIL/uL — ABNORMAL LOW (ref 3.87–5.11)
RDW: 14.5 % (ref 11.5–15.5)
WBC: 16.9 10*3/uL — ABNORMAL HIGH (ref 4.0–10.5)
nRBC: 0 % (ref 0.0–0.2)

## 2022-12-07 LAB — GLUCOSE, CAPILLARY: Glucose-Capillary: 133 mg/dL — ABNORMAL HIGH (ref 70–99)

## 2022-12-07 LAB — HIV ANTIBODY (ROUTINE TESTING W REFLEX): HIV Screen 4th Generation wRfx: NONREACTIVE

## 2022-12-07 LAB — PHOSPHORUS: Phosphorus: 2.7 mg/dL (ref 2.5–4.6)

## 2022-12-07 LAB — MAGNESIUM: Magnesium: 1.9 mg/dL (ref 1.7–2.4)

## 2022-12-07 MED ORDER — TOFACITINIB CITRATE 5 MG PO TABS
2.0000 | ORAL_TABLET | Freq: Every day | ORAL | Status: DC
  Filled 2022-12-07: qty 2

## 2022-12-07 MED ORDER — HYDROCODONE-ACETAMINOPHEN 5-325 MG PO TABS
1.0000 | ORAL_TABLET | ORAL | Status: DC | PRN
  Administered 2022-12-07 – 2022-12-08 (×5): 1 via ORAL
  Filled 2022-12-07 (×5): qty 1

## 2022-12-07 MED ORDER — KETOROLAC TROMETHAMINE 15 MG/ML IJ SOLN
15.0000 mg | Freq: Once | INTRAMUSCULAR | Status: AC
  Administered 2022-12-07: 15 mg via INTRAVENOUS
  Filled 2022-12-07: qty 1

## 2022-12-07 NOTE — TOC Initial Note (Addendum)
Transition of Care [facility]) - Initial/Assessment Note    Patient Details  Name: Nicole Bass MRN: [id number] Date of Birth: 1951/02/04  [facility] CM/SW Contact:    Truddie Hidden, RN Phone Number: 12/07/2022, 12:17 PM  Clinical Narrative:                 Spoke with patient regarding therapy recommendation for HHPT. She is agreeable to therapy and does not have a preference. She stated she has a RW and husband will bring it here for someone to attach the platform. She was advised plat form will be requested via [employer]. She was also advised the [facility] agency will call her directly to scheduled her SOC.  Referral for [facility] sent to [facility] from [place].  Request for platform sent to [employer].          Patient Goals and CMS Choice            Expected Discharge Plan and Services                                              Prior Living Arrangements/Services                       Activities of Daily Living Home Assistive Devices/Equipment: Eyeglasses ADL Screening (condition at time of admission) Patient's cognitive ability adequate to safely complete daily activities?: Yes Is the patient deaf or have difficulty hearing?: No Does the patient have difficulty seeing, even when wearing glasses/contacts?: No Does the patient have difficulty concentrating, remembering, or making decisions?: No Patient able to express need for assistance with ADLs?: Yes Does the patient have difficulty dressing or bathing?: No Independently performs ADLs?: Yes (appropriate for developmental age) Does the patient have difficulty walking or climbing stairs?: No Weakness of Legs: None Weakness of Arms/Hands: None  Permission Sought/Granted                  Emotional Assessment              Admission diagnosis:  Weakness [R53.1] Elevated troponin [R79.89] AKI (acute kidney injury) [N17.9] Near syncope [R55] Hypotension [I95.9] Sepsis [A41.9] Patient  Active Problem List   Diagnosis Date Noted   Hypotension 12/06/2022   HTN (hypertension) 12/06/2022   Severe sepsis 12/06/2022   UTI (urinary tract infection) 12/06/2022   Left wrist fracture 12/06/2022   RA (rheumatoid arthritis) 12/06/2022   Myocardial injury 12/06/2022   Acute renal failure superimposed on stage 3a chronic kidney disease 12/06/2022   Hypophosphatemia 12/06/2022   Lung nodule 12/06/2022   Varicose veins of both lower extremities with pain 04/19/2022   Lymphedema 04/19/2022   Syncope 11/09/2020   Normocytic anemia 11/09/2020   Hypomagnesemia 11/09/2020   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 11/09/2020   Chronic diastolic CHF (congestive heart failure) 11/09/2020   Fusion of lumbar spine 10/17/2020   Lumbar stenosis 10/12/2020   Swelling of limb 06/02/2020   Cervical spondylosis 05/01/2020   Atherosclerosis of native coronary artery of native heart without angina pectoris 04/27/2020   Numbness and tingling in left hand 10/29/2019   Status post reverse total shoulder replacement, 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   Status post right partial knee replacement 02/12/2019   Rotator cuff arthropathy, right 01/25/2019   Iron deficiency anemia 09/13/2018   Anemia, unspecified 09/09/2018   Iron deficiency 09/04/2018   Acute kidney injury  08/23/2018   HTN, goal below 140/80 06/04/2018   Hyperthyroidism 02/08/2018   Imbalance 12/18/2017   Stomatitis, ulcerative 09/06/2017   Dyspareunia in female 08/22/2017   Vaginal atrophy 08/22/2017   Obesity (BMI 30.0-34.9) 08/22/2017   Varicose veins of leg with pain, left 04/05/2017   Peripheral polyneuropathy 04/05/2017   Lumbar radiculopathy 04/05/2017   Tremor 02/13/2017   Sensory neuropathy 02/13/2017   Restless leg syndrome 02/13/2017   Impingement syndrome of left shoulder 09/29/2016   Acute diarrhea 09/06/2016   History of pyelonephritis 09/06/2016   Hypokalemia 09/06/2016   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   Pyelonephritis  08/29/2016   Rheumatoid arthritis, unspecified 08/29/2016   Nontraumatic complete tear of right rotator cuff 03/08/2016   Acute pain of right shoulder 01/27/2016   Type 2 diabetes mellitus with diabetic neuropathy 10/27/2015   Closed fracture of distal end of right radius with routine healing 09/17/2015   Crohn's colitis 09/04/2015   Acute bilateral low back pain without sciatica 03/23/2015   SOB (shortness of breath) on exertion 01/15/2015   Pedal edema 01/15/2015   Status post open reduction with internal fixation of fracture 11/27/2014   Aftercare for healing traumatic fracture of lower arm 09/25/2014   Painful rib 09/11/2014   Primary osteoarthritis of left knee 04/24/2014   Osteoporosis 03/17/2014   Depression with anxiety 03/17/2014   B12 deficiency 03/17/2014   Crohn's disease, unspecified, without complications 03/17/2014   Urolith 07/16/2013   Right flank pain 07/16/2013   Abdominal pain 07/16/2013   PCP:  Marguarite Arbour, MD Pharmacy:   [facility] [phone] Dan Humphreys, [place] - [address] AT [facility] OF [address] [place] [address] Phone: [phone] Fax: [phone]  [facility] #[id number] [place] Rough, [place] - [address] AT [facility] OF [address] [place] [address] Phone: [phone] Fax: [phone]     Social Determinants of Health (SDOH) Social History: SDOH Screenings   Food Insecurity: No Food Insecurity (12/06/2022)  Housing: Low Risk  (12/06/2022)  Transportation Needs: No Transportation Needs (12/06/2022)  Utilities: Not At Risk (12/06/2022)  Financial Resource Strain: Unknown (08/23/2018)  Physical Activity: Unknown (08/23/2018)  Social Connections: Unknown (08/23/2018)  Stress: Unknown (08/23/2018)  Tobacco Use: Low Risk  (12/06/2022)   SDOH Interventions:     Readmission Risk Interventions     No data to display

## 2022-12-07 NOTE — Evaluation (Signed)
Physical Therapy Evaluation Patient Details Name: Nicole Bass MRN: [id number] DOB: April 13, 1951 Today's Date: 12/07/2022  History of Present Illness  Nicole Bass is a 72 y.o. female with medical history significant of HTN, dCHF, depression with anxiety, CKD-3a, anemia, obesity obesity BMI 30.91, kidney stone, Crohn's disease and rheumatoid arthritis (on chronic low-dose prednisone, Harriette Ohara), sclerosing mesenteritis, pyelonephritis, recent fall with left wrist fracture, who presents with dizziness, weakness, left rib cage pain, left hip pain.   Clinical Impression  Patient received in bed, she is agreeable to PT assessment. Patient reports pain on left side and left hip. She is mod A for bed mobility, min guard for sit to stand and min guard for ambulation with RW. Patient leaning on forearm during gait due to L wrist pain ( reports fracture). She was instructed on L Platform and set up with platform on walker in room. Patient will continue to benefit from skilled PT to improve functional independence and safety with mobility.        Recommendations for follow up therapy are one component of a multi-disciplinary discharge planning process, led by the attending physician.  Recommendations may be updated based on patient status, additional functional criteria and insurance authorization.  Follow Up Recommendations       Assistance Recommended at Discharge Frequent or constant Supervision/Assistance  Patient can return home with the following  A little help with walking and/or transfers;A little help with bathing/dressing/bathroom;Assist for transportation;Help with stairs or ramp for entrance;Assistance with cooking/housework    Equipment Recommendations Other (comment) (L platform for RW)  Recommendations for Other Services       Functional Status Assessment Patient has had a recent decline in their functional status and demonstrates the ability to make significant improvements in  function in a reasonable and predictable amount of time.     Precautions / Restrictions Precautions Precautions: Fall Restrictions Other Position/Activity Restrictions: recent L wrist fracture, treating as NWB      Mobility  Bed Mobility Overal bed mobility: Needs Assistance Bed Mobility: Supine to Sit     Supine to sit: Mod assist     General bed mobility comments: mod A to raise trunk to seated position.    Transfers Overall transfer level: Needs assistance Equipment used: Rolling walker (2 wheels) Transfers: Sit to/from Stand Sit to Stand: Min guard                Ambulation/Gait Ambulation/Gait assistance: Min guard Gait Distance (Feet): 60 Feet Assistive device: Rolling walker (2 wheels) Gait Pattern/deviations: Step-to pattern, Decreased step length - right, Decreased step length - left Gait velocity: decreased     General Gait Details: leaning on L forearm when using RW,(provided patient with platform for RW), pain limited  Stairs            Wheelchair Mobility    Modified Rankin (Stroke Patients Only)       Balance Overall balance assessment: Needs assistance, History of Falls Sitting-balance support: Feet supported Sitting balance-Leahy Scale: Good     Standing balance support: Bilateral upper extremity supported, During functional activity, Reliant on assistive device for balance Standing balance-Leahy Scale: Fair                               Pertinent Vitals/Pain Pain Assessment Pain Assessment: 0-10 Pain Score: 7  Pain Location: L h ip and L ribs Pain Descriptors / Indicators: Discomfort, Grimacing, Guarding, Moaning Pain Intervention(s): Monitored during  session, Patient requesting pain meds-RN notified, Repositioned    Home Living Family/patient expects to be discharged to:: Private residence Living Arrangements: Spouse/significant other Available Help at Discharge: Family;Available 24 hours/day Type of Home:  House Home Access: Stairs to enter Entrance Stairs-Rails: Right;Left;Can reach both Entrance Stairs-Number of Steps: 3   Home Layout: Multi-level;Able to live on main level with bedroom/bathroom;Laundry or work area in [employer] Equipment: Pharmacist, hospital (2 wheels)      Prior Function Prior Level of Function : Independent/Modified Independent;History of Falls (last six months)                     Hand Dominance   Dominant Hand: Right    Extremity/Trunk Assessment   Upper Extremity Assessment Upper Extremity Assessment: Defer to OT evaluation    Lower Extremity Assessment Lower Extremity Assessment: Generalized weakness;LLE deficits/detail LLE: Unable to fully assess due to pain    Cervical / Trunk Assessment Cervical / Trunk Assessment: Normal  Communication   Communication: No difficulties  Cognition Arousal/Alertness: Awake/alert Behavior During Therapy: WFL for tasks assessed/performed Overall Cognitive Status: Within Functional Limits for tasks assessed                                          General Comments      Exercises     Assessment/Plan    PT Assessment Patient needs continued PT services  PT Problem List Decreased strength;Decreased activity tolerance;Decreased mobility;Decreased balance;Decreased knowledge of use of DME;Pain       PT Treatment Interventions DME instruction;Gait training;Stair training;Functional mobility training;Therapeutic activities;Patient/family education;Balance training;Therapeutic exercise    PT Goals (Current goals can be found in the Care Plan section)  Acute Rehab PT Goals Patient Stated Goal: to return home, decrease pain PT Goal Formulation: With patient/family Time For Goal Achievement: 12/13/22 Potential to Achieve Goals: Good    Frequency Min 3X/week     Co-evaluation               AM-PAC PT "6 Clicks" Mobility  Outcome Measure Help needed turning from your  back to your side while in a flat bed without using bedrails?: A Lot Help needed moving from lying on your back to sitting on the side of a flat bed without using bedrails?: A Lot Help needed moving to and from a bed to a chair (including a wheelchair)?: A Little Help needed standing up from a chair using your arms (e.g., wheelchair or bedside chair)?: A Little Help needed to walk in hospital room?: A Little Help needed climbing 3-5 steps with a railing? : A Little 6 Click Score: 16    End of Session Equipment Utilized During Treatment: Gait belt Activity Tolerance: Patient limited by pain Patient left: in bed;with call bell/phone within reach;with bed alarm set;with family/visitor present Nurse Communication: Mobility status PT Visit Diagnosis: Other abnormalities of gait and mobility (R26.89);Muscle weakness (generalized) (M62.81);Difficulty in walking, not elsewhere classified (R26.2);Pain;History of falling (Z91.81) Pain - Right/Left: Left Pain - part of body: Arm;Hip (ribs)    Time: 4098-1191 PT Time Calculation (min) (ACUTE ONLY): 23 min   Charges:   PT Evaluation $PT Eval Moderate Complexity: 1 Mod PT Treatments $Gait Training: 8-22 mins        Callaghan Laverdure, PT, GCS 12/07/22,11:47 AM

## 2022-12-07 NOTE — Progress Notes (Signed)
       CROSS COVER NOTE  NAME: Nicole Bass MRN: [id number] DOB : 1951-04-22    HPI/Events of Note   Report:no relief post norco admin. Reports "bruised rib pain " 9/10  On review of chart:H & P, labs reviewed. Allergies reviewed    Assessment and  Interventions   Assessment:  Plan: Dose of torodol ordered       Donnie Mesa NP [facility]

## 2022-12-07 NOTE — Progress Notes (Signed)
PROGRESS NOTE  Nicole Bass    DOB: 03-Apr-1951, 72 y.o.  ZOX:[id number]    Code Status: Full Code   DOA: 12/06/2022   LOS: 1   Brief hospital course  Nicole Bass is a 72 y.o. female with a PMH significant for  HTN, dCHF, depression with anxiety, CKD-3a, anemia, obesity obesity BMI 30.91, kidney stone, Crohn's disease and rheumatoid arthritis (on chronic low-dose prednisone, Harriette Ohara), sclerosing mesenteritis, pyelonephritis, recent fall with left wrist fracture .  They presented from home to the ED on 12/06/2022 with dizziness, weakness, left rib cage pain x 1 days. Also had recent fall with minor left wrist fracture and other minor injuries that still cause pain. She states EMS arrived and checked her blood pressure which was 70/45 per her husband. Pt was given 3L of NS bolus. Her Bp has improved to 103/53 now.    In the ED, it was found that they had temperature normal, heart rate 114, RR 22, oxygen saturation 94% on room air. WBC 11.8, positive urinalysis (hyze appearance, large amount of leukocyte, rare bacteria, WBC 21-50, squamous cell 613), BNP 102, trop 52 --> 103, worsening renal function, potassium 2.9, magnesium of 1.4, phosphorus of 2.0, lactic acid 3.6, 0.0, procalcitonin 7.13  Significant findings included Chest x-ray showed chronic interstitial coarsening.  CTA with significant breathing motion effect, but it is negative for PE . WJX:BJYNW rhythm, QTc 464, early R wave progression, low voltage.   They were initially treated with fentanyl, gabapentin, zofran, potassium, aztreonam, IVF, magnesium, norco.   Patient was admitted to medicine service for further workup and management of severe sepsis as outlined in detail below.  12/07/22 -stable, improved  Assessment & Plan  Principal Problem:   Severe sepsis Active Problems:   UTI (urinary tract infection)   Hypotension   Left wrist fracture   Myocardial injury   Acute renal failure superimposed on stage 3a chronic  kidney disease   Hypokalemia   Hypomagnesemia   Hypophosphatemia   Chronic diastolic CHF (congestive heart failure)   HTN (hypertension)   Normocytic anemia   Depression with anxiety   Crohn's colitis   RA (rheumatoid arthritis)   Obesity (BMI 30.0-34.9)   Lung nodule  Severe sepsis due to UTI: Patient has positive urinalysis for UTI.  She meets criteria for severe sepsis with heart rate 114, RR 22.  Lactic acid at 3.6--> 3.0.  Procalcitonin 7.13.  Initially hypotensive which responded to IV fluid resuscitation. BxCx- NGTD. UxCx: multispecies and likely not to have accurate recollection results given 2 days of Abx. Endorses frequency but is on IVF and with infection. LA 1.5 - continue Aztreonam IV and assess response given no sensitivities possible - vital signs stable and able to tolerate PO, stop IVF   Hypotension: Initial blood pressure 86/79, which improved to 103/53, after giving 2 L normal saline bolus.  Likely due to sepsis, dehydration and continuation of Lasix. Has improved.  - trial off IVF -Hold Lasix   Left wrist fracture  recent fall - PT/OT- recommending HHPT. TOC engaged -As needed Tylenol, Norco   Myocardial injury: Troponin level 52, 103, 149.  No chest pain -Aspirin 81 mg daily -Check A1c, FLP   Acute renal failure superimposed on stage 3a chronic kidney disease: Baseline creatinine 1.0 on 09/30/2021.  Creatinine is 1.40, BUN 19, GFR 40.  Likely due to multifactorial etiology, including UTI, possible ATN due to hypotension, dehydration with continuation of Lasix. resolved -Hold Lasix, ketorolac   Hypokalemia, Hypomagnesemia, Hypophosphatemia:  Potassium 2.9, magnesium 1.4, phosphorus 2.0. resolved K+ and Phos.  -Repleted potassium, magnesium and phosphorus PRN -Follow-up labs in the morning   Chronic diastolic CHF (congestive heart failure): 2D echo on 05/17/2022 showed EF of 60-85% with grade 1 diastolic dysfunction.  Patient does not have leg edema JVD.  CHF  seems to be compensated.  BNP 102. -Hold Lasix due to hypotension   HTN (hypertension) -Hold Lasix due to hypotension   Normocytic anemia: Hemoglobin stable 9.9 (8.6 on 11/12/2020) Decreased to 8.6 today -Follow-up repeat CBC am   Depression with anxiety -Continue home medications   Crohn's colitis and RA: pt is taking prednisone 3 mg daily. Pt is also taking Harriette Ohara now -Continue home prednisone - continue home Xelijanz   Obesity (BMI 30.0-34.9): Body weight 76.7 kg, BMI 30.91 -Encourage losing weight -Healthy diet and exercise   Lung nodule: Incidental findings of CTA  - Recommend continued surveillance in 6 months with PCP  Body mass index is 30.91 kg/m.  VTE ppx: enoxaparin (LOVENOX) injection 37.5 mg Start: 12/06/22 2200   Diet:     Diet   Diet Heart Fluid consistency: Thin   Consultants: None   Subjective 12/07/22    Pt reports feeling improved. Still has some residual left sided pain from fall. Endorses urinary frequency. Denies dysuria.    Objective   Vitals:   12/06/22 2012 12/06/22 2333 12/07/22 0349 12/07/22 0755  BP: (!) 121/57 (!) 104/58 109/65 124/69  Pulse: (!) 105 95 79 76  Resp: 20 20 18 16   Temp: 98.3 F (36.8 C) 98.3 F (36.8 C) 98 F (36.7 C) 97.8 F (36.6 C)  TempSrc:      SpO2: 95% 97% 95% 97%  Weight:      Height:        Intake/Output Summary (Last 24 hours) at 12/07/2022 0805 Last data filed at 12/07/2022 0115 Gross per 24 hour  Intake 4743.41 ml  Output 800 ml  Net 3943.41 ml   Filed Weights   12/06/22 0654  Weight: 76.7 kg     Physical Exam:  General: awake, alert, NAD HEENT: ecchymosis left eye, clear conjunctiva, anicteric sclera, MMM, hearing grossly normal Respiratory: normal respiratory effort. CTAB Cardiovascular: quick capillary refill, normal S1/S2, RRR, no JVD, murmurs Gastrointestinal: soft, NT, ND Nervous: A&O x3. no gross focal neurologic deficits, normal speech Extremities: moves all equally, no edema,  normal tone. L wrist in brace. Skin: dry, intact, normal temperature, normal color. No rashes, lesions or ulcers on exposed skin Psychiatry: normal mood, congruent affect  Labs   I have personally reviewed the following labs and imaging studies CBC    Component Value Date/Time   WBC 16.9 (H) 12/07/2022 0447   RBC 2.70 (L) 12/07/2022 0447   HGB 8.6 (L) 12/07/2022 0447   HGB 11.2 (L) 11/25/2013 1439   HCT 27.5 (L) 12/07/2022 0447   HCT 34.1 (L) 11/25/2013 1439   PLT 157 12/07/2022 0447   PLT 344 11/25/2013 1439   MCV 101.9 (H) 12/07/2022 0447   MCV 87 11/25/2013 1439   MCH 31.9 12/07/2022 0447   MCHC 31.3 12/07/2022 0447   RDW 14.5 12/07/2022 0447   RDW 18.6 (H) 11/25/2013 1439   LYMPHSABS 1.0 10/17/2020 0924   LYMPHSABS 1.0 11/25/2013 1439   MONOABS 0.9 10/17/2020 0924   MONOABS 1.1 (H) 11/25/2013 1439   EOSABS 0.1 10/17/2020 0924   EOSABS 0.1 11/25/2013 1439   BASOSABS 0.1 10/17/2020 0924   BASOSABS 0.1 11/25/2013 1439  Latest Ref Rng & Units 12/07/2022    4:47 AM 12/06/2022    7:29 AM 09/30/2021    2:45 PM  BMP  Glucose 70 - 99 mg/dL 409  811    BUN 8 - 23 mg/dL 16  19    Creatinine 9.14 - 1.00 mg/dL 7.82  9.56  2.13   Sodium 135 - 145 mmol/L 140  138    Potassium 3.5 - 5.1 mmol/L 3.7  2.9    Chloride 98 - 111 mmol/L 114  100    CO2 22 - 32 mmol/L 21  27    Calcium 8.9 - 10.3 mg/dL 7.0  8.2      CT Angio Chest PE W and/or Wo Contrast  Result Date: 12/06/2022 CLINICAL DATA:  Recent fall.  Chest pain. EXAM: CT ANGIOGRAPHY CHEST WITH CONTRAST TECHNIQUE: Multidetector CT imaging of the chest was performed using the standard protocol during bolus administration of intravenous contrast. Multiplanar CT image reconstructions and MIPs were obtained to evaluate the vascular anatomy. RADIATION DOSE REDUCTION: This exam was performed according to the departmental dose-optimization program which includes automated exposure control, adjustment of the mA and/or kV according to  patient size and/or use of iterative reconstruction technique. CONTRAST:  60mL OMNIPAQUE IOHEXOL 350 MG/ML SOLN COMPARISON:  X-ray 12/06/2022.  CT 10/19/2021 FINDINGS: Cardiovascular: Heart is nonenlarged. No pericardial effusion. There are some coronary artery calcifications are identified. There is some enlargement of the main pulmonary artery. Please correlate for pulmonary artery hypertension. Breathing motion identified. This limits evaluation of emboli particularly nondiagnostic for small and peripheral emboli. No segmental or larger pulmonary embolism identified. Mediastinum/Nodes: Slightly patulous thoracic esophagus. Preserved thyroid gland. No specific abnormal lymph node enlargement identified in the axillary region, hilum or mediastinum. Lungs/Pleura: No pleural effusion or pneumothorax. Mild lung base opacities. Atelectasis is favored over infiltrate but recommend follow-up. There is also some ill-defined nodular opacities in the right upper lobe identified once again are similar to prior measuring up to 12 mm when adjusting for technique. Upper Abdomen: Adrenal glands are preserved in the upper abdomen. Musculoskeletal: Right shoulder arthroplasty. Multifocal degenerative changes along the spine. Fixation hardware along the lower cervical spine. Old upper sternal fracture. Augmentation cement along vertebral bodies. Review of the MIP images confirms the above findings. IMPRESSION: Significant breathing motion. No segmental or larger pulmonary embolism identified. There is some enlargement of the main pulmonary artery. Please correlate for pulmonary artery hypertension. Dependent bandlike opacity seen at the bases. Atelectasis favored. Stable nodular infiltrative areas in the right upper lobe these of the differential would have been stable by report since at least November 2022. Recommend continued surveillance in 6 months Aortic Atherosclerosis (ICD10-I70.0). Electronically Signed   By: Karen Kays  M.D.   On: 12/06/2022 11:07   DG Chest Portable 1 View  Result Date: 12/06/2022 CLINICAL DATA:  Left chest pain. EXAM: PORTABLE CHEST 1 VIEW COMPARISON:  11/09/2020 FINDINGS: The cardio pericardial silhouette is enlarged. Interstitial markings are diffusely coarsened with chronic features. Nodular densities in the right upper lobe compatible with the nodularity seen on chest CT 10/19/2021. No pulmonary edema or focal lung consolidation. Bones are diffusely demineralized. Telemetry leads overlie the chest. IMPRESSION: Chronic interstitial coarsening without acute cardiopulmonary findings. Right upper lobe nodules better characterized on chest CT 10/19/2021. Electronically Signed   By: Kennith Center M.D.   On: 12/06/2022 07:53    Disposition Plan & Communication  Patient status: Inpatient  Admitted From: Home Planned disposition location: Home health Anticipated discharge  date: 4/25 pending delivery of home safety equipment and transition to PO Abx  Family Communication: none at bedside    Author: Leeroy Bock, DO [facility] 12/07/2022, 8:05 AM   Available by Epic secure chat 7AM-7PM. If 7PM-7AM, please contact night-coverage.  [facility] contact information found on [email or web address].

## 2022-12-08 DIAGNOSIS — R55 Syncope and collapse: Secondary | ICD-10-CM | POA: Diagnosis not present

## 2022-12-08 DIAGNOSIS — N179 Acute kidney failure, unspecified: Secondary | ICD-10-CM | POA: Diagnosis not present

## 2022-12-08 DIAGNOSIS — A419 Sepsis, unspecified organism: Secondary | ICD-10-CM | POA: Diagnosis not present

## 2022-12-08 DIAGNOSIS — R531 Weakness: Secondary | ICD-10-CM | POA: Diagnosis not present

## 2022-12-08 LAB — BASIC METABOLIC PANEL
Anion gap: 5 (ref 5–15)
BUN: 18 mg/dL (ref 8–23)
CO2: 23 mmol/L (ref 22–32)
Calcium: 7.7 mg/dL — ABNORMAL LOW (ref 8.9–10.3)
Chloride: 112 mmol/L — ABNORMAL HIGH (ref 98–111)
Creatinine, Ser: 0.96 mg/dL (ref 0.44–1.00)
GFR, Estimated: >60 mL/min (ref >60)
Glucose, Bld: 91 mg/dL (ref 70–99)
Potassium: 3.6 mmol/L (ref 3.5–5.1)
Sodium: 140 mmol/L (ref 135–145)

## 2022-12-08 LAB — HEMOGLOBIN A1C
Hgb A1c MFr Bld: 6.3 % — ABNORMAL HIGH (ref 4.8–5.6)
Mean Plasma Glucose: 134 mg/dL

## 2022-12-08 MED ORDER — HYDROCODONE-ACETAMINOPHEN 5-325 MG PO TABS: 1.0000 | ORAL_TABLET | Freq: Four times a day (QID) | ORAL | 0 refills | Status: AC | PRN

## 2022-12-08 MED ORDER — AMOXICILLIN 500 MG PO CAPS: 500.0000 mg | ORAL_CAPSULE | Freq: Three times a day (TID) | ORAL | 0 refills | Status: AC

## 2022-12-08 NOTE — Discharge Summary (Signed)
Physician Discharge Summary  Patient: Nicole Bass:[id number] DOB: 08/01/1951   Code Status: Full Code Admit date: 12/06/2022 Discharge date: 12/08/2022 Disposition: Home health, PT and OT PCP: Marguarite Arbour, MD  Recommendations for Outpatient Follow-up:  Follow up with PCP within 1-2 weeks Regarding general hospital follow up and preventative care I highly Recommend attempting to reduce the number/dose/frequency of her sedating medications and those listed on [facility] list. She is on several medications that are major contributor to her frequent falls and subsequent injuries.  Follow up with ortho surgery Regarding wrist fracture  Discharge Diagnoses:  Principal Problem:   Severe sepsis Active Problems:   UTI (urinary tract infection)   Hypotension   Left wrist fracture   Myocardial injury   Acute renal failure superimposed on stage 3a chronic kidney disease   Hypokalemia   Hypomagnesemia   Hypophosphatemia   Chronic diastolic CHF (congestive heart failure)   HTN (hypertension)   Normocytic anemia   Depression with anxiety   Crohn's colitis   RA (rheumatoid arthritis)   Obesity (BMI 30.0-34.9)   Lung nodule   Weakness   Elevated troponin   Sepsis secondary to UTI   Near syncope   AKI (acute kidney injury)  Brief Hospital Course Summary: Nicole Bass is a 72 y.o. female with a PMH significant for  HTN, dCHF, depression with anxiety, CKD-3a, anemia, obesity obesity BMI 30.91, kidney stone, Crohn's disease and rheumatoid arthritis (on chronic low-dose prednisone, Harriette Ohara), sclerosing mesenteritis, pyelonephritis, recent fall with left wrist fracture .   They presented from home to the ED on 12/06/2022 with dizziness, weakness, left rib cage pain x 1 days. Also had recent fall with minor left wrist fracture and other minor injuries that still cause pain. She states EMS arrived and checked her blood pressure which was 70/45 per her husband. Pt was given 3L of  NS bolus. Her Bp has improved to 103/53 now.     In the ED, it was found that they had temperature normal, heart rate 114, RR 22, oxygen saturation 94% on room air. WBC 11.8, positive urinalysis (hyze appearance, large amount of leukocyte, rare bacteria, WBC 21-50, squamous cell 613), BNP 102, trop 52 --> 103, worsening renal function, potassium 2.9, magnesium of 1.4, phosphorus of 2.0, lactic acid 3.6, 0.0, procalcitonin 7.13  Significant findings included Chest x-ray showed chronic interstitial coarsening.  CTA with significant breathing motion effect, but it is negative for PE . NFA:OZHYQ rhythm, QTc 464, early R wave progression, low voltage.    They were initially treated with fentanyl, gabapentin, zofran, potassium, aztreonam, IVF, magnesium, norco.    Patient was admitted to medicine service for further workup and management of severe sepsis as outlined in detail below.   12/08/22 -stable, improved  Was continued on IV aztreonam throughout admission for several medication allergies limited choices. Urine culture was not helpful for antibiotic selection or narrowing due to multiple species present.  Discharged to complete treatment of amoxicillin for UTI.   Discharge Condition: Good, improved Recommended discharge diet: Regular healthy diet  Consultations: None   Procedures/Studies: None   Allergies as of 12/08/2022       Reactions   Amoxicillin-pot Clavulanate Diarrhea   Ciprofloxacin Itching, Rash   Levofloxacin Hives, Rash   Methotrexate    Other reaction(s): Other (See Comments) Mouth Ulcers/Sores Mouth and throat ulcers   Methotrexate Derivatives    Mouth and throat ulcers   Other Other (See Comments), Rash   Other reaction(s):  Other (See Comments), Unknown Per pts MD she is unable to take this medication because it is contraindicated with her other medications.   Per pts MD she is unable to take this medication because it is contraindicated with her other  medications.   Other reaction(s): Other (See Comments), Unknown Other reaction(s): Other (See Comments), Unknown Per pts MD she is unable to take this medication because it is contraindicated with her other medications.   Per pts MD she is unable to take this medication because it is contraindicated with her other medications.   Other reaction(s): Other (See Comments), Unknown Other reaction(s): Other (See Comments), Unknown Per pts MD she is unable to take this medication because it is contraindicated with her  Per pts MD she is unable to take this medication because it is contraindicated with her other medications.   Other reaction(s): Other (See Comments) Other reaction(s): Other (See Comments), Unknown Per pts MD she is unable to take this medication because it is contraindicated with her other medications.   Per pts MD she is unable to take this medication because it is contraindicated with her other medications.   Other reaction(s): Other (See Comments), Unknown Other reaction(s): Other (See Comments), Unknown Per pts MD she is unable to take this medication because it is contraindicated with her other medications.   Per pts MD she is unable to take this medication because it is contraindicated with her other medications.   Other reaction(s): Other (See Comments), Unknown Other reaction(s): Other (See Comments), Unknown Per pts MD she is unable to take this medication because it is contraindicated with her  Per pts MD she is unable to take this medication because it is contraindicated with her other medications.     Sulfa Antibiotics Other (See Comments)   Per pts MD she is unable to take this medication because it is contraindicated with her other medications. NOTE: Pt has never had reaction to these or sulfasalazine    Per pts MD she is unable to take this medication because it is contraindicated with her other medications.    Per pts MD she is unable to take this medication because  it is contraindicated with her other medications.    Other reaction(s): Other (See Comments), Unknown  Per pts MD she is unable to take this medication because it is contraindicated with her other medications.   Rosuvastatin Swelling, Other (See Comments)   And cramps   Trelegy Ellipta [fluticasone-umeclidin-vilant]    Laryngitis    Cefprozil Rash   Doxycycline Rash   Pt tolerates Z pack and amoxicillin   Enbrel [etanercept] Itching, Rash   Humira [adalimumab] Itching, Rash   Hydrocodone-acetaminophen Itching, Other (See Comments)   Itching only   Lorabid [loracarbef] Itching, Rash   Meropenem Itching, Rash   Oxycodone Itching, Other (See Comments), Rash   Other reaction(s): Other (See Comments)   Oxycodone-acetaminophen Itching   Primidone Itching, Rash        Medication List     STOP taking these medications    ALPRAZolam 0.25 MG tablet Commonly known as: XANAX   ALPRAZolam 0.5 MG tablet Commonly known as: Xanax   clindamycin 1 % external solution Commonly known as: CLEOCIN T   clindamycin 300 MG capsule Commonly known as: CLEOCIN   diazepam 10 MG tablet Commonly known as: Valium   estradiol 0.1 MG/GM vaginal cream Commonly known as: ESTRACE VAGINAL   furosemide 20 MG tablet Commonly known as: LASIX   ketoconazole 2 % shampoo Commonly  known as: NIZORAL   lidocaine 2 % solution Commonly known as: XYLOCAINE   Restasis 0.05 % ophthalmic emulsion Generic drug: cycloSPORINE   rOPINIRole 1 MG tablet Commonly known as: REQUIP   sucralfate 1 g tablet Commonly known as: CARAFATE   Xeljanz 5 MG Tabs Generic drug: Tofacitinib Citrate       TAKE these medications    acetaminophen 650 MG CR tablet Commonly known as: TYLENOL Take by mouth.   alendronate 70 MG tablet Commonly known as: FOSAMAX Take 70 mg by mouth every Monday. Take with a full glass of water on an empty stomach.   amoxicillin 500 MG capsule Commonly known as: AMOXIL Take 1 capsule  (500 mg total) by mouth 3 (three) times daily for 3 days.   atropine 1 % ophthalmic solution Place into the right eye.   cetirizine 10 MG tablet Commonly known as: ZYRTEC Take 10 mg by mouth daily.   chlorhexidine 0.12 % solution Commonly known as: PERIDEX Use as directed 15 mLs in the mouth or throat as needed (mouth sores).   cholecalciferol 25 MCG (1000 UNIT) tablet Commonly known as: VITAMIN D3 Take 1,000 Units by mouth daily.   citalopram 10 MG tablet Commonly known as: CELEXA Take 1 tablet by mouth daily.   cyanocobalamin 1000 MCG/ML injection Commonly known as: VITAMIN B12 Inject 1,000 mcg into the muscle every 30 (thirty) days.   dorzolamide 2 % ophthalmic solution Commonly known as: TRUSOPT Apply to eye.   gabapentin 400 MG capsule Commonly known as: NEURONTIN Take 400 mg by mouth at bedtime.   gabapentin 100 MG capsule Commonly known as: NEURONTIN Take 100 mg by mouth every morning.   HYDROcodone-acetaminophen 5-325 MG tablet Commonly known as: NORCO/VICODIN Take 1 tablet by mouth every 6 (six) hours as needed for up to 5 days.   ketorolac 10 MG tablet Commonly known as: TORADOL Take 10 mg by mouth every 6 (six) hours as needed for moderate pain.   magnesium oxide 400 MG tablet Commonly known as: MAG-OX Take 400 mg by mouth every Monday, Wednesday, and Friday.   multivitamin with minerals Tabs tablet Take 1 tablet by mouth daily.   nortriptyline 10 MG capsule Commonly known as: PAMELOR Take by mouth.   nystatin cream Commonly known as: MYCOSTATIN Apply topically.   omeprazole 40 MG capsule Commonly known as: PRILOSEC Take 40 mg by mouth daily before breakfast.   potassium chloride SA 20 MEQ tablet Commonly known as: KLOR-CON M Take 20 mEq by mouth daily.   prednisoLONE acetate 1 % ophthalmic suspension Commonly known as: PRED FORTE Prednisolone Taper in the right eye: One drop every hour while awake for 1 week, One drop every 2 hours  while awake for 1 week, One drop every 3 hours while awake for 1 week, One drop 4 times a day - stay at this dose   predniSONE 1 MG tablet Commonly known as: DELTASONE Take 3 mg by mouth daily with breakfast.   PROBIOTIC PO Take 1 capsule by mouth daily. Culturelle Probiotic   traMADol 50 MG tablet Commonly known as: ULTRAM Take 50 mg by mouth 2 (two) times daily as needed.   valACYclovir 500 MG tablet Commonly known as: VALTREX Take 1 tablet twice daily for 5 days for flares. Start at first sign of symptoms.               Durable Medical Equipment  (From admission, onward)           Start  Ordered   12/08/22 0952  For home use only DME Walker rolling  Once       Question Answer Comment  Walker: With 5 Inch Wheels   Patient needs a walker to treat with the following condition Fall      12/08/22 0951   12/07/22 1139  For home use only DME Walker platform  Once       Question:  Patient needs a walker to treat with the following condition  Answer:  Falls frequently   12/07/22 1138            Follow-up Information     [facility] Follow up.   Why: They will follow up with you for your home health therapy needs.                Subjective   Pt reports feeling well. Has some mild pain of left ribs remaining from prior fall. Denies dysuria. Frequency has improved after discontinuation of IVF.   All questions and concerns were addressed at time of discharge.  Objective  Blood pressure (!) 153/76, pulse 74, temperature 97.7 F (36.5 C), resp. rate 16, height 5\' 2"  (1.575 m), weight 83.6 kg, SpO2 96 %.   General: Pt is alert, awake, not in acute distress Cardiovascular: RRR, S1/S2 +, no rubs, no gallops Respiratory: CTA bilaterally, no wheezing, no rhonchi Abdominal: Soft, NT, ND, bowel sounds + Extremities: no edema, no cyanosis. Left wrist brace in place Skin: healing ecchymosis of left upper lid  The results of significant diagnostics  from this hospitalization (including imaging, microbiology, ancillary and laboratory) are listed below for reference.   Imaging studies: CT Angio Chest PE W and/or Wo Contrast  Result Date: 12/06/2022 CLINICAL DATA:  Recent fall.  Chest pain. EXAM: CT ANGIOGRAPHY CHEST WITH CONTRAST TECHNIQUE: Multidetector CT imaging of the chest was performed using the standard protocol during bolus administration of intravenous contrast. Multiplanar CT image reconstructions and MIPs were obtained to evaluate the vascular anatomy. RADIATION DOSE REDUCTION: This exam was performed according to the departmental dose-optimization program which includes automated exposure control, adjustment of the mA and/or kV according to patient size and/or use of iterative reconstruction technique. CONTRAST:  60mL OMNIPAQUE IOHEXOL 350 MG/ML SOLN COMPARISON:  X-ray 12/06/2022.  CT 10/19/2021 FINDINGS: Cardiovascular: Heart is nonenlarged. No pericardial effusion. There are some coronary artery calcifications are identified. There is some enlargement of the main pulmonary artery. Please correlate for pulmonary artery hypertension. Breathing motion identified. This limits evaluation of emboli particularly nondiagnostic for small and peripheral emboli. No segmental or larger pulmonary embolism identified. Mediastinum/Nodes: Slightly patulous thoracic esophagus. Preserved thyroid gland. No specific abnormal lymph node enlargement identified in the axillary region, hilum or mediastinum. Lungs/Pleura: No pleural effusion or pneumothorax. Mild lung base opacities. Atelectasis is favored over infiltrate but recommend follow-up. There is also some ill-defined nodular opacities in the right upper lobe identified once again are similar to prior measuring up to 12 mm when adjusting for technique. Upper Abdomen: Adrenal glands are preserved in the upper abdomen. Musculoskeletal: Right shoulder arthroplasty. Multifocal degenerative changes along the spine.  Fixation hardware along the lower cervical spine. Old upper sternal fracture. Augmentation cement along vertebral bodies. Review of the MIP images confirms the above findings. IMPRESSION: Significant breathing motion. No segmental or larger pulmonary embolism identified. There is some enlargement of the main pulmonary artery. Please correlate for pulmonary artery hypertension. Dependent bandlike opacity seen at the bases. Atelectasis favored. Stable nodular infiltrative areas in the  right upper lobe these of the differential would have been stable by report since at least November 2022. Recommend continued surveillance in 6 months Aortic Atherosclerosis (ICD10-I70.0). Electronically Signed   By: Karen Kays M.D.   On: 12/06/2022 11:07   DG Chest Portable 1 View  Result Date: 12/06/2022 CLINICAL DATA:  Left chest pain. EXAM: PORTABLE CHEST 1 VIEW COMPARISON:  11/09/2020 FINDINGS: The cardio pericardial silhouette is enlarged. Interstitial markings are diffusely coarsened with chronic features. Nodular densities in the right upper lobe compatible with the nodularity seen on chest CT 10/19/2021. No pulmonary edema or focal lung consolidation. Bones are diffusely demineralized. Telemetry leads overlie the chest. IMPRESSION: Chronic interstitial coarsening without acute cardiopulmonary findings. Right upper lobe nodules better characterized on chest CT 10/19/2021. Electronically Signed   By: Kennith Center M.D.   On: 12/06/2022 07:53   MM 3D SCREEN BREAST BILATERAL  Result Date: 11/29/2022 CLINICAL DATA:  Screening. EXAM: DIGITAL SCREENING BILATERAL MAMMOGRAM WITH TOMOSYNTHESIS AND CAD TECHNIQUE: Bilateral screening digital craniocaudal and mediolateral oblique mammograms were obtained. Bilateral screening digital breast tomosynthesis was performed. The images were evaluated with computer-aided detection. COMPARISON:  Previous exam(s). ACR Breast Density Category d: The breasts are extremely dense, which lowers  the sensitivity of mammography. FINDINGS: There are no findings suspicious for malignancy. IMPRESSION: No mammographic evidence of malignancy. A result letter of this screening mammogram will be mailed directly to the patient. RECOMMENDATION: Screening mammogram in one year. (Code:[id number]) BI-RADS CATEGORY  1: Negative. Electronically Signed   By: Frederico Hamman M.D.   On: 11/29/2022 16:13    Labs: Basic Metabolic Panel: Recent Labs  Lab 12/06/22 0729 12/07/22 0447 12/08/22 0438  NA 138 140 140  K 2.9* 3.7 3.6  CL 100 114* 112*  CO2 27 21* 23  GLUCOSE 105* 121* 91  BUN 19 16 18   CREATININE 1.40* 0.97 0.96  CALCIUM 8.2* 7.0* 7.7*  MG 1.4* 1.9  --   PHOS 2.0* 2.7  --    CBC: Recent Labs  Lab 12/06/22 0729 12/07/22 0447  WBC 11.8* 16.9*  HGB 9.9* 8.6*  HCT 31.2* 27.5*  MCV 99.4 101.9*  PLT 174 157   Microbiology: Results for orders placed or performed during the hospital encounter of 12/06/22  Urine Culture     Status: Abnormal   Collection Time: 12/06/22  7:29 AM   Specimen: Urine, Random  Result Value Ref Range Status   Specimen Description   Final    URINE, RANDOM Performed at [facility], [address] [place], [place] [address]    Special Requests   Final    NONE Performed at [facility], [address] [place], [place] [address]    Culture MULTIPLE SPECIES PRESENT, SUGGEST RECOLLECTION (A)  Final   Report Status 12/07/2022 FINAL  Final  Culture, blood (Routine X 2) w Reflex to ID Panel     Status: None (Preliminary result)   Collection Time: 12/06/22 11:06 AM   Specimen: BLOOD RIGHT ARM  Result Value Ref Range Status   Specimen Description BLOOD RIGHT ARM  Final   Special Requests   Final    BOTTLES DRAWN AEROBIC AND ANAEROBIC Blood Culture results may not be optimal due to an inadequate volume of blood received in culture bottles   Culture   Final    NO GROWTH 4 DAYS Performed at [facility], [address]  [place], [place] [address]    Report Status PENDING  Incomplete  Culture, blood (Routine  X 2) w Reflex to ID Panel     Status: None (Preliminary result)   Collection Time: 12/06/22 12:17 PM   Specimen: BLOOD  Result Value Ref Range Status   Specimen Description BLOOD RIGHT ANTECUBITAL  Final   Special Requests   Final    BOTTLES DRAWN AEROBIC AND ANAEROBIC Blood Culture adequate volume   Culture   Final    NO GROWTH 4 DAYS Performed at [facility], [address], [place], [place] [address]    Report Status PENDING  Incomplete   Time coordinating discharge: Over 30 minutes  Leeroy Bock, MD  [facility] 12/08/2022, 12:59 PM

## 2022-12-08 NOTE — Discharge Instructions (Signed)
Please follow up with your primary doctor within a week to discuss your medical care.  I am concerned that you have several medications on your home medication list that cause sedation and it's likely that this is a reason for falling and injuries.  Please discuss with your PCP the option of reducing some of these medications to keep you safe.  Use your new walker with platform when moving around the house. When moving from laying to sitting, then sitting to standing, and then standing to walking, please take at least 120 seconds in each position before moving forward to give your body time to adjust. This will help prevent falls.

## 2022-12-08 NOTE — Plan of Care (Signed)

## 2022-12-08 NOTE — Progress Notes (Signed)
Pharmacy Antibiotic Note  Nicole Bass is a 72 y.o. female admitted on 12/06/2022 with UTI.  Pharmacy has been consulted for aztreonam dosing.  Pt with allergies to beta-lactams and fluoroquinolones.  Has tolerated aztreonam in the past.  CrCl 35.8.  Plan: Continue Aztreonam 1 gm IV Q8H for now - recommend change to Keflex po or Augemtnin po once ready for oral therapy - patient tolerated cefazolin and amoxicillin in the past. Per patient's own recollection she NOT allergic to penicillins either.  Height:  (157.5 cm) Weight: 83.6 kg (184 lb 6.4 oz) IBW/kg (Calculated) : 50.1  Temp (24hrs), Avg:98 F (36.7 C), Min:97.7 F (36.5 C), Max:98.2 F (36.8 C)  Recent Labs  Lab 12/06/22 0729 12/06/22 1106 12/06/22 1903 12/06/22 2147 12/07/22 0447 12/08/22 0438  WBC 11.8*  --   --   --  16.9*  --   CREATININE 1.40*  --   --   --  0.97 0.96  LATICACIDVEN  --  3.0*  3.6* 2.8* 1.5  --   --      Estimated Creatinine Clearance: 54.7 mL/min (by C-G formula based on SCr of 0.96 mg/dL).    Allergies  Allergen Reactions   Amoxicillin-Pot Clavulanate Diarrhea   Ciprofloxacin Itching and Rash   Levofloxacin Hives and Rash   Methotrexate     Other reaction(s): Other (See Comments) Mouth Ulcers/Sores Mouth and throat ulcers   Methotrexate Derivatives     Mouth and throat ulcers   Other Other (See Comments) and Rash    Other reaction(s): Other (See Comments), Unknown Per pts MD she is unable to take this medication because it is contraindicated with her other medications.   Per pts MD she is unable to take this medication because it is contraindicated with her other medications.   Other reaction(s): Other (See Comments), Unknown Other reaction(s): Other (See Comments), Unknown Per pts MD she is unable to take this medication because it is contraindicated with her other medications.   Per pts MD she is unable to take this medication because it is contraindicated with her other  medications.   Other reaction(s): Other (See Comments), Unknown Other reaction(s): Other (See Comments), Unknown Per pts MD she is unable to take this medication because it is contraindicated with her  Per pts MD she is unable to take this medication because it is contraindicated with her other medications.   Other reaction(s): Other (See Comments) Other reaction(s): Other (See Comments), Unknown Per pts MD she is unable to take this medication because it is contraindicated with her other medications.   Per pts MD she is unable to take this medication because it is contraindicated with her other medications.   Other reaction(s): Other (See Comments), Unknown Other reaction(s): Other (See Comments), Unknown Per pts MD she is unable to take this medication because it is contraindicated with her other medications.   Per pts MD she is unable to take this medication because it is contraindicated with her other medications.   Other reaction(s): Other (See Comments), Unknown Other reaction(s): Other (See Comments), Unknown Per pts MD she is unable to take this medication because it is contraindicated with her  Per pts MD she is unable to take this medication because it is contraindicated with her other medications.     Sulfa Antibiotics Other (See Comments)    Per pts MD she is unable to take this medication because it is contraindicated with her other medications.  NOTE: Pt has never had reaction to  these or sulfasalazine    Per pts MD she is unable to take this medication because it is contraindicated with her other medications.    Per pts MD she is unable to take this medication because it is contraindicated with her other medications.    Other reaction(s): Other (See Comments), Unknown  Per pts MD she is unable to take this medication because it is contraindicated with her other medications.   Rosuvastatin Swelling and Other (See Comments)    And cramps   Trelegy Ellipta  [Fluticasone-Umeclidin-Vilant]     Laryngitis    Cefprozil Rash   Doxycycline Rash    Pt tolerates Z pack and amoxicillin   Enbrel [Etanercept] Itching and Rash   Humira [Adalimumab] Itching and Rash   Hydrocodone-Acetaminophen Itching and Other (See Comments)    Itching only   Lorabid [Loracarbef] Itching and Rash   Meropenem Itching and Rash   Oxycodone Itching, Other (See Comments) and Rash    Other reaction(s): Other (See Comments)   Oxycodone-Acetaminophen Itching   Primidone Itching and Rash    Antimicrobials this admission: 4/23 aztreonam >>   Dose adjustments this admission: N/A  Microbiology results: 4/23 uCx: multiple species 4/23 BCx: NG x 2 days   Thank you for allowing pharmacy to be a part of this patient's care.  Merdith Adan Rodriguez-Guzman PharmD, BCPS 12/08/2022 12:21 PM

## 2022-12-08 NOTE — TOC Transition Note (Signed)
Transition of Care [facility]) - CM/SW Discharge Note   Patient Details  Name: Nicole Bass MRN: [id number] Date of Birth: 02-Feb-1951  Transition of Care [facility]) CM/SW Contact:  Margarito Liner, LCSW Phone Number: 12/08/2022, 11:42 AM   Clinical Narrative:   Patient has orders to discharge home today. Left message for [facility] liaison to notify. [employer] liaison brought walker platform to the room but patient's home walker does not have the two bars to hook the platform onto. He asked CSW to also order a RW. Sent information to [employer] liaison to process. No further concerns. CSW signing off.  Final next level of care: Home w Home Health Services Barriers to Discharge: Barriers Resolved   Patient Goals and CMS Choice      Discharge Placement                      Patient and family notified of of transfer: 12/08/22  Discharge Plan and Services Additional resources added to the After Visit Summary for                  DME Arranged: Dan Humphreys platform, Walker rolling DME Agency: [employer] Date DME Agency Contacted: 12/08/22   Representative spoke with at DME Agency: Tommye Standard and Yvone Neu HH Arranged: PT, OT  Digestive Care Agency: [facility] Date HH Agency Contacted: 12/08/22   Representative spoke with at [facility] Agency: Feliberto Gottron  Social Determinants of Health (SDOH) Interventions SDOH Screenings   Food Insecurity: No Food Insecurity (12/06/2022)  Housing: Low Risk  (12/06/2022)  Transportation Needs: No Transportation Needs (12/06/2022)  Utilities: Not At Risk (12/06/2022)  Financial Resource Strain: Unknown (08/23/2018)  Physical Activity: Unknown (08/23/2018)  Social Connections: Unknown (08/23/2018)  Stress: Unknown (08/23/2018)  Tobacco Use: Low Risk  (12/06/2022)     Readmission Risk Interventions     No data to display

## 2022-12-08 NOTE — Progress Notes (Signed)
Physical Therapy Treatment Patient Details Name: Nicole Bass MRN: [id number] DOB: [birth year] Today's Date: 12/08/2022   History of Present Illness Nicole Bass is a 72 y.o. female with medical history significant of HTN, dCHF, depression with anxiety, CKD-3a, anemia, obesity obesity BMI 30.91, kidney stone, Crohn's disease and rheumatoid arthritis (on chronic low-dose prednisone, Harriette Ohara), sclerosing mesenteritis, pyelonephritis, recent fall with left wrist fracture, who presents with dizziness, weakness, left rib cage pain, left hip pain.    PT Comments    Patient received standing in room getting dressed with assistance from friend. Patient is agreeable to walk prior to going home. Waiting for equipment to be delivered. Patient with improved pain and activity tolerance today. Requiring min guard for ambulation and able to ambulate 160 feet with L platform RW. Patient will continue to benefit from skilled PT to improve safety and functional independence.       Recommendations for follow up therapy are one component of a multi-disciplinary discharge planning process, led by the attending physician.  Recommendations may be updated based on patient status, additional functional criteria and insurance authorization.  Follow Up Recommendations       Assistance Recommended at Discharge Frequent or constant Supervision/Assistance  Patient can return home with the following A little help with walking and/or transfers;A little help with bathing/dressing/bathroom;Assist for transportation;Help with stairs or ramp for entrance;Assistance with cooking/housework   Equipment Recommendations       Recommendations for Other Services       Precautions / Restrictions Precautions Precautions: Fall Restrictions Weight Bearing Restrictions: No Other Position/Activity Restrictions: recent L wrist fracture, treating as NWB     Mobility  Bed Mobility               General bed mobility  comments: NT, patient received standing in room with friends present to assist home    Transfers                   General transfer comment: NT    Ambulation/Gait Ambulation/Gait assistance: Supervision, Min guard Gait Distance (Feet): 160 Feet Assistive device: Left platform walker Gait Pattern/deviations: Step-through pattern, Decreased step length - right, Decreased step length - left Gait velocity: decreased     General Gait Details: min guard for ambulation with platform RW, decreased pain this session and increased tolerance to activity.   Stairs             Wheelchair Mobility    Modified Rankin (Stroke Patients Only)       Balance Overall balance assessment: Modified Independent, History of Falls         Standing balance support: Bilateral upper extremity supported, During functional activity, Reliant on assistive device for balance Standing balance-Leahy Scale: Fair                              Cognition Arousal/Alertness: Awake/alert Behavior During Therapy: WFL for tasks assessed/performed Overall Cognitive Status: Within Functional Limits for tasks assessed                                          Exercises      General Comments        Pertinent Vitals/Pain Pain Assessment Pain Assessment: Faces Faces Pain Scale: Hurts a little bit Pain Location: L h ip and L ribs Pain Intervention(s): Monitored during  session, Repositioned    Home Living                          Prior Function            PT Goals (current goals can now be found in the care plan section) Acute Rehab PT Goals Patient Stated Goal: to return home, decrease pain PT Goal Formulation: With patient/family Time For Goal Achievement: 12/13/22 Potential to Achieve Goals: Good Progress towards PT goals: Progressing toward goals    Frequency    Min 3X/week      PT Plan Current plan remains appropriate     Co-evaluation              AM-PAC PT "6 Clicks" Mobility   Outcome Measure  Help needed turning from your back to your side while in a flat bed without using bedrails?: A Little Help needed moving from lying on your back to sitting on the side of a flat bed without using bedrails?: A Little Help needed moving to and from a bed to a chair (including a wheelchair)?: A Little Help needed standing up from a chair using your arms (e.g., wheelchair or bedside chair)?: A Little Help needed to walk in hospital room?: A Little Help needed climbing 3-5 steps with a railing? : A Little 6 Click Score: 18    End of Session   Activity Tolerance: Patient tolerated treatment well Patient left: in bed;with family/visitor present;with nursing/sitter in room Nurse Communication: Mobility status PT Visit Diagnosis: Other abnormalities of gait and mobility (R26.89);Muscle weakness (generalized) (M62.81);Difficulty in walking, not elsewhere classified (R26.2);Pain;History of falling (Z91.81) Pain - Right/Left: Left Pain - part of body: Hip     Time: 1610-9604 PT Time Calculation (min) (ACUTE ONLY): 19 min  Charges:  $Gait Training: 8-22 mins                     Suzy Kugel, PT, GCS 12/08/22,1:36 PM

## 2022-12-08 NOTE — Care Management Important Message (Signed)
Important Message  Patient Details  Name: Nicole Bass MRN: [id number] Date of Birth: Feb 19, 1951   Medicare Important Message Given:  N/A - LOS <3 / Initial given by admissions     [name] 12/08/2022, 1:39 PM

## 2022-12-11 LAB — CULTURE, BLOOD (ROUTINE X 2)
Culture: NO GROWTH
Culture: NO GROWTH
Special Requests: ADEQUATE

## 2022-12-13 ENCOUNTER — Telehealth: Payer: Self-pay | Admitting: Cardiovascular Disease

## 2022-12-13 NOTE — Telephone Encounter (Signed)
Spoke with patient's spouse Kathlene November) and he stated that he wanted his wife to be seen because they were at their PCP today (12/13/22) and they stated that she was still short of breath after recent hospital admission. Spouse stated that she did not appear to be short of breath but agreed it is best to have her evaluated by cardiology. Patient scheduled 12/16/2022 3:35 PM.

## 2022-12-13 NOTE — Telephone Encounter (Signed)
Received referral from patient's PCP, spoke with Kathlene November, spouse of patient, states that patient went to [facility] on 4/20,  due to her BP was 75/40 and elevated heart rate, this followed after a fall the day before. Patient was advised to follow up with PCP, which she did today and they advised her to follow up with Dr. Mariah Milling. Pt c/o BP issue: STAT if pt c/o blurred vision, one-sided weakness or slurred speech  1. What are your last 5 BP readings?  Today- 110/60  2. Are you having any other symptoms (ex. Dizziness, headache, blurred vision, passed out)? Sob-shallow breaths  3. What is your BP issue? Low when went to [facility]  Patient's spouse states cardiac enzymes were elevated. Please call to discuss.  Pt c/o Shortness Of Breath: STAT if SOB developed within the last 24 hours or pt is noticeably SOB on the phone  1. Are you currently SOB (can you hear that pt is SOB on the phone)? no  2. How long have you been experiencing SOB? Patient was experiencing SOB since last visit with Dr. Mariah Milling  3. Are you SOB when sitting or when up moving around? both  4. Are you currently experiencing any other symptoms? No other symptoms at the current time.

## 2022-12-16 ENCOUNTER — Encounter: Payer: Self-pay | Admitting: Physician Assistant

## 2022-12-16 ENCOUNTER — Ambulatory Visit: Payer: Medicare Other | Attending: Physician Assistant | Admitting: Physician Assistant

## 2022-12-16 VITALS — BP 116/71 | HR 86 | Ht 62.0 in | Wt 174.0 lb

## 2022-12-16 DIAGNOSIS — R7989 Other specified abnormal findings of blood chemistry: Secondary | ICD-10-CM

## 2022-12-16 DIAGNOSIS — R600 Localized edema: Secondary | ICD-10-CM | POA: Diagnosis not present

## 2022-12-16 DIAGNOSIS — I25118 Atherosclerotic heart disease of native coronary artery with other forms of angina pectoris: Secondary | ICD-10-CM

## 2022-12-16 DIAGNOSIS — R0609 Other forms of dyspnea: Secondary | ICD-10-CM

## 2022-12-16 DIAGNOSIS — I10 Essential (primary) hypertension: Secondary | ICD-10-CM

## 2022-12-16 NOTE — H&P (View-Only) (Signed)
 Cardiology Office Note    Date:  12/16/2022   ID:  Whitni C Dorman, DOB 02/25/1951, MRN [id number]  PCP:  Sparks, Jeffrey D, MD  Cardiologist:  Timothy Gollan, MD  Electrophysiologist:  None   Chief Complaint: Follow-up  History of Present Illness:   Nicole Bass is a 72 y.o. female with history of coronary artery calcification, HTN, RA, OSA on CPAP, chronic dyspnea with pulmonary mycosis with cryptococcus pneumonia, bronchiectasis, venous insufficiency status post ablation, OSA, peripheral neuropathy, and cervical and lumbar degenerative disc disease who presents for follow-up of exertional dyspnea and elevated troponin.  Prior CT of the chest for evaluation of sternal pain in the setting of a mechanical fall showed coronary calcification involving the LAD, LCx, and RCA as well as punctuated calcified granuloma of the right middle lobe, left upper lobe tiny punctate calcified granuloma, and suspected distal bronchiectasis with mild airway plugging in the left lower lobe.  She underwent Lexiscan MPI in 06/2020 which showed no evidence of scar or ischemia with an LVEF of 75%.  PFTs completed reportedly abnormal.  She was admitted to the hospital in 2022 for syncope following back surgery.  There were significant electrolyte derangements noted with elevated D-dimer as well.  CTA chest negative for PE.  Subsequent outpatient cardiac monitoring showed a predominant rhythm of sinus rhythm with a mean heart rate of 92 bpm, sinus tachycardia, and NSVT up to 14 beats.  In follow-up, she has continued to note shortness of breath and worsening lower extremity edema.  In this setting, she underwent echo from 05/2022 showed an EF of 60 to 65%, no regional wall motion analysis, grade 1 diastolic dysfunction, normal RV systolic function and ventricular cavity size, no significant valvular abnormalities, and an estimated right atrial pressure of 3 mmHg.  She was most recently seen in our office on 11/21/2022  and was without symptoms of angina or cardiac decompensation.  No further workup was pursued at that time.  She was admitted to the hospital from 4/23-4/25/2024 after presenting from home with dizziness, weakness, and left rib cage pain after having sustained a fall several days prior.  She was treated for severe sepsis presumed to be in the setting of UTI.  High-sensitivity troponin 52 to trend down to 275.  BNP 102.  PCT 7.13.  Lactic acid 3.6 trending to 3.0.  Chest x-ray showed chronic interstitial coarsening without acute cardiopulmonary process.  CTA chest showed no evidence of PE but some enlargement of the main pulmonary artery along with dependent bandlike opacities in the bases favored to be atelectasis and a stable nodular infiltrative areas in the right upper.  She was managed by the hospitalist service.  She follow-up with her PCP on 12/13/2022 noting ongoing orthopedic discomfort from her fall as well as shortness of breath.  Chest x-ray was recommended.  She was referred to cardiology given dyspnea and elevated troponins noted during hospital admission.  She comes in accompanied by her husband today.  She reports a several month history, possibly 1 year of exertional dyspnea and fatigue.  No frank chest pain.  She indicates that she cannot, provide of stairs without having to stop and rest secondary to dyspnea.  No associated palpitations, diaphoresis, nausea, or vomiting.  No prior ischemic evaluation.  She words for a while, her dyspnea did improve following treatment of underlying anxiety, though of medication, her dyspnea has worsened.  She was also noted to have elevated high-sensitivity troponin trending to 275, not cycled thereafter.    She continues to be in pain involving her hip following her fall.  She remains unclear as to what led to this most recent fall.  She reports she was standing in the kitchen by the trash can and that her legs gave out from under her.  She denies LOC.  No chest  pain or palpitations preceding or following episode.  She has chronic lower extremity swelling.  No progressive orthopnea.  She reports her mother passed from an MI at age 70.   Labs independently reviewed: 11/2022 - A1c 6.4, Hgb 9.3, PLT 291, TC 142, TG 175, HDL 50, LDL 98, potassium 3.4 BUN 11, serum creatinine 1.0, albumin 3.6, AST/ALT normal, TSH normal  Past Medical History:  Diagnosis Date   Anemia    iron everyday, 3 iron infusions last one August 2020   Anxiety    nervous   Arthritis    rheumatoid   Collagen vascular disease (HCC)    Crohn disease (HCC)    Crohn's disease (HCC)    Crohn's disease (HCC)    DDD (degenerative disc disease), cervical    DDD (degenerative disc disease), cervical    DDD (degenerative disc disease), cervical 2003   DDD (degenerative disc disease), cervical    DDD (degenerative disc disease), lumbar    Degenerative disc disease, cervical    Depression    Diarrhea    Dyspnea    out of shape   Fatty liver    GERD (gastroesophageal reflux disease)    Hand weakness    left hand worse, small tremors   Headache    with accident   History of chicken pox    History of claustrophobia    History of kidney stones    History of shingles    HOH (hard of hearing)    left ear   Immune deficiency disorder (HCC)    Lymphatic disorder    Meniere's disease    Motion sickness    Nasal fracture 03/27/2020   due to fall   Neuromuscular disorder (HCC)    Neuropathy    legs   Peripheral neuropathy    Pyelonephritis    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HCC)    UTI (urinary tract infection)    HO   Vertigo    can be accompanied by nausea   Wears dentures    upper dentures    Past Surgical History:  Procedure Laterality Date   ABDOMINAL HYSTERECTOMY     APPENDECTOMY     BACK SURGERY     x 3   CERVICAL SPINE SURGERY     metal plate   CESAREAN SECTION  1976 & 1989   X 2   CHOLECYSTECTOMY     CLOSED REDUCTION NASAL FRACTURE N/A 04/09/2020   Procedure:  CLOSED REDUCTION NASAL FRACTURE;  Surgeon: Juengel, Paul, MD;  Location: [facility];  Service: ENT;  Laterality: N/A;   COLON SURGERY  1999   removed 18 inches of colon,removed appendix   COLONOSCOPY     COLONOSCOPY WITH PROPOFOL N/A 08/02/2021   Procedure: COLONOSCOPY WITH PROPOFOL;  Surgeon: Locklear, Cameron T, MD;  Location: [facility];  Service: Endoscopy;  Laterality: N/A;   dental implants     3 teeth/ wears dentures   DILATION AND CURETTAGE OF UTERUS  2004   ELBOW FRACTURE SURGERY Left 2008   FIBEROPTIC BRONCHOSCOPY N/A 06/01/2020   Procedure: BEDSIDE BRONCHOSCOPY FIBEROPTIC;  Surgeon: Aleskerov, Fuad, MD;  Location: [facility];  Service: Thoracic;  Laterality: N/A;   FRACTURE   SURGERY     JOINT REPLACEMENT Right 2015   partial knee replacement   KYPHOPLASTY     KYPHOPLASTY N/A 04/14/2015   Procedure: KYPHOPLASTY;  Surgeon: Michael Menz, MD;  Location: [facility];  Service: Orthopedics;  Laterality: N/A;   KYPHOPLASTY N/A 06/04/2015   Procedure: KYPHOPLASTY L-5;  Surgeon: Michael Menz, MD;  Location: [facility];  Service: Orthopedics;  Laterality: N/A;   KYPHOPLASTY     LUMBAR LAMINECTOMY FOR EPIDURAL ABSCESS N/A 10/12/2020   Procedure: SYNOVIAL CYST RESECTION;  Surgeon: Cook, Steven, MD;  Location: [facility];  Service: Neurosurgery;  Laterality: N/A;   MAXIMUM ACCESS (MAS) TRANSFORAMINAL LUMBAR INTERBODY FUSION (TLIF) 1 LEVEL Left 10/12/2020   Procedure: OPEN L5/S1 TRANSFORAMINAL LUMBAR INTERBODY FUSION (TLIF) 1 LEVEL;  Surgeon: Cook, Steven, MD;  Location: [facility];  Service: Neurosurgery;  Laterality: Left;   MEDIAL PARTIAL KNEE REPLACEMENT Right 2015   NECK/PLATE  2002   OOPHORECTOMY Bilateral    ORIF NASAL FRACTURE N/A 04/09/2020   Procedure: OPEN REDUCTION INTERNAL FIXATION (ORIF) NASAL FRACTURE;  Surgeon: Juengel, Paul, MD;  Location: [facility];  Service: ENT;  Laterality: N/A;   REPAIR EXTENSOR TENDON Left 01/08/2019   Procedure: Realignment  EXTENSOR TENDON;   Surgeon: Menz, Michael, MD;  Location: [facility];  Service: Orthopedics;  Laterality: Left;   REVERSE SHOULDER ARTHROPLASTY Right 04/18/2019   Procedure: REVERSE SHOULDER ARTHROPLASTY;  Surgeon: Poggi, John J, MD;  Location: [facility];  Service: Orthopedics;  Laterality: Right;   SHOULDER ARTHROSCOPY WITH ROTATOR CUFF REPAIR AND SUBACROMIAL DECOMPRESSION Right 04/13/2016   Procedure: SHOULDER ARTHROSCOPY WITH ROTATOR CUFF REPAIR AND SUBACROMIAL DECOMPRESSION, release of long head biceps tendon;  Surgeon: Harold Kernodle, MD;  Location: [facility];  Service: Orthopedics;  Laterality: Right;   TOE SURGERY Right 2013   TONSILLECTOMY  1995   UPPER GI ENDOSCOPY     WRIST FRACTURE SURGERY Bilateral     Current Medications: Current Meds  Medication Sig   acetaminophen (TYLENOL) 650 MG CR tablet Take by mouth.   alendronate (FOSAMAX) 70 MG tablet Take 70 mg by mouth every Monday. Take with a full glass of water on an empty stomach.   atropine 1 % ophthalmic solution Place into the right eye.   cetirizine (ZYRTEC) 10 MG tablet Take 10 mg by mouth daily.   chlorhexidine (PERIDEX) 0.12 % solution Use as directed 15 mLs in the mouth or throat as needed (mouth sores).   cholecalciferol (VITAMIN D3) 25 MCG (1000 UT) tablet Take 1,000 Units by mouth daily.   citalopram (CELEXA) 10 MG tablet Take 1 tablet by mouth daily.   cyanocobalamin (,VITAMIN B-12,) 1000 MCG/ML injection Inject 1,000 mcg into the muscle every 30 (thirty) days.   dorzolamide (TRUSOPT) 2 % ophthalmic solution Apply to eye.   Ferrous Sulfate (IRON PO) Take 1 tablet by mouth daily.   gabapentin (NEURONTIN) 100 MG capsule Take 100 mg by mouth every morning.   gabapentin (NEURONTIN) 400 MG capsule Take 400 mg by mouth at bedtime.   HYDROcodone-acetaminophen (NORCO) 10-325 MG tablet Take 1 tablet by mouth every 6 (six) hours as needed.   magnesium oxide (MAG-OX) 400 MG tablet Take 400 mg by mouth every Monday, Wednesday, and Friday.   Multiple  Vitamin (MULTIVITAMIN WITH MINERALS) TABS tablet Take 1 tablet by mouth daily.   nortriptyline (PAMELOR) 10 MG capsule Take by mouth.   nystatin cream (MYCOSTATIN) Apply topically.   omeprazole (PRILOSEC) 40 MG capsule Take 40 mg by mouth daily before breakfast.     potassium chloride SA (KLOR-CON M) 20 MEQ tablet Take 20 mEq by mouth daily.   prednisoLONE acetate (PRED FORTE) 1 % ophthalmic suspension Prednisolone Taper in the right eye: One drop every hour while awake for 1 week, One drop every 2 hours while awake for 1 week, One drop every 3 hours while awake for 1 week, One drop 4 times a day - stay at this dose   predniSONE (DELTASONE) 1 MG tablet Take 3 mg by mouth daily with breakfast.    Probiotic Product (PROBIOTIC PO) Take 1 capsule by mouth daily. Culturelle Probiotic   valACYclovir (VALTREX) 500 MG tablet Take 1 tablet twice daily for 5 days for flares. Start at first sign of symptoms.    Allergies:   Amoxicillin-pot clavulanate, Ciprofloxacin, Levofloxacin, Methotrexate, Methotrexate derivatives, Other, Sulfa antibiotics, Rosuvastatin, Trelegy ellipta [fluticasone-umeclidin-vilant], Cefprozil, Doxycycline, Enbrel [etanercept], Humira [adalimumab], Hydrocodone-acetaminophen, Lorabid [loracarbef], Meropenem, Oxycodone, Oxycodone-acetaminophen, and Primidone   Social History   Socioeconomic History   Marital status: Married    Spouse name: Not on file   Number of children: Not on file   Years of education: Not on file   Highest education level: Not on file  Occupational History   Not on file  Tobacco Use   Smoking status: Never   Smokeless tobacco: Never  Vaping Use   Vaping Use: Never used  Substance and Sexual Activity   Alcohol use: No   Drug use: No   Sexual activity: Yes    Birth control/protection: Surgical  Other Topics Concern   Not on file  Social History Narrative   Not on file   Social Determinants of Health   Financial Resource Strain: Unknown (08/23/2018)    Overall Financial Resource Strain (CARDIA)    Difficulty of Paying Living Expenses: Patient declined  Food Insecurity: No Food Insecurity (12/06/2022)   Hunger Vital Sign    Worried About Running Out of Food in the Last Year: Never true    Ran Out of Food in the Last Year: Never true  Transportation Needs: No Transportation Needs (12/06/2022)   PRAPARE - Transportation    Lack of Transportation (Medical): No    Lack of Transportation (Non-Medical): No  Physical Activity: Unknown (08/23/2018)   Exercise Vital Sign    Days of Exercise per Week: Patient declined    Minutes of Exercise per Session: Patient declined  Stress: Unknown (08/23/2018)   [employer] - Occupational Stress Questionnaire    Feeling of Stress : Patient declined  Social Connections: Unknown (08/23/2018)   Social Connection and Isolation Panel [NHANES]    Frequency of Communication with Friends and Family: Patient declined    Frequency of Social Gatherings with Friends and Family: Patient declined    Attends Religious Services: Patient declined    Active Member of Clubs or Organizations: Patient declined    Attends Club or Organization Meetings: Patient declined    Marital Status: Patient declined     Family History:  The patient's family history includes Colon cancer in her maternal grandfather; Diabetes in her mother; Emphysema in her father and mother. There is no history of Breast cancer or Ovarian cancer.  ROS:   12-point review of systems is negative unless otherwise noted in the HPI.   EKGs/Labs/Other Studies Reviewed:    Studies reviewed were summarized above. The additional studies were reviewed today:  2D echo 05/27/2022: 1. Left ventricular ejection fraction, by estimation, is 60 to 65%. The  left ventricle has normal function. The left   ventricle has no regional  wall motion abnormalities. Left ventricular diastolic parameters are  consistent with Grade I diastolic   dysfunction (impaired relaxation).   2. Right ventricular systolic function is normal. The right ventricular  size is normal.   3. The mitral valve is normal in structure. No evidence of mitral valve  regurgitation. No evidence of mitral stenosis.   4. The aortic valve is tricuspid. Aortic valve regurgitation is not  visualized. No aortic stenosis is present.   5. The inferior vena cava is normal in size with greater than 50%  respiratory variability, suggesting right atrial pressure of 3 mmHg.   Comparison(s): Previous LVEF reported as 50-55%.  __________  2D echo 11/11/2020: 1. Left ventricular ejection fraction, by estimation, is 50 to 55%. The  left ventricle has low normal function. The left ventricle has no regional  wall motion abnormalities. Left ventricular diastolic parameters were  normal.   2. Right ventricular systolic function is normal. The right ventricular  size is normal.   3. The mitral valve is normal in structure. Trivial mitral valve  regurgitation.   4. The aortic valve is normal in structure. Aortic valve regurgitation is  not visualized.  __________  2D echo with bubble 08/25/2018: - Left ventricle: Systolic function was normal. The estimated    ejection fraction was in the range of 55% to 60%.  - Atrial septum: Echo contrast study showed no right-to-left atrial    level shunt, at baseline or with provocation.   Impressions:   - No cardiac source of emboli was indentified.     EKG:  EKG is ordered today.  The EKG ordered today demonstrates NSR, 86 bpm, poor R wave progression along the precordial leads, no acute ST-T changes  Recent Labs: 12/06/2022: ALT 31; B Natriuretic Peptide 102.5 12/07/2022: Hemoglobin 8.6; Magnesium 1.9; Platelets 157 12/08/2022: BUN 18; Creatinine, Ser 0.96; Potassium 3.6; Sodium 140  Recent Lipid Panel    Component Value Date/Time   CHOL 136 12/06/2022 1106   TRIG 70 12/06/2022 1106   HDL 64 12/06/2022 1106   CHOLHDL  2.1 12/06/2022 1106   VLDL 14 12/06/2022 1106   LDLCALC 58 12/06/2022 1106    PHYSICAL EXAM:    VS:  BP 116/71 (BP Location: Right Arm, Patient Position: Sitting)   Pulse 86   Ht 5' 2" (1.575 m)   Wt 174 lb (78.9 kg)   LMP  (LMP Unknown)   SpO2 93%   BMI 31.83 kg/m   BMI: Body mass index is 31.83 kg/m.  Physical Exam Vitals reviewed.  Constitutional:      Appearance: She is well-developed.  HENT:     Head: Normocephalic and atraumatic.  Eyes:     General:        Right eye: No discharge.        Left eye: No discharge.  Neck:     Vascular: No JVD.  Cardiovascular:     Rate and Rhythm: Normal rate and regular rhythm.     Heart sounds: Normal heart sounds, S1 normal and S2 normal. Heart sounds not distant. No midsystolic click and no opening snap. No murmur heard.    No friction rub.  Pulmonary:     Effort: Pulmonary effort is normal. No respiratory distress.     Breath sounds: Normal breath sounds. No decreased breath sounds, wheezing or rales.  Chest:     Chest wall: No tenderness.  Abdominal:     General: There is no distension.  Musculoskeletal:       Cervical back: Normal range of motion.     Right lower leg: Edema present.     Left lower leg: Edema present.  Skin:    General: Skin is warm and dry.     Nails: There is no clubbing.  Neurological:     Mental Status: She is alert and oriented to person, place, and time.  Psychiatric:        Speech: Speech normal.        Behavior: Behavior normal.        Thought Content: Thought content normal.        Judgment: Judgment normal.     Wt Readings from Last 3 Encounters:  12/16/22 174 lb (78.9 kg)  12/07/22 184 lb 6.4 oz (83.6 kg)  11/24/22 172 lb (78 kg)     ASSESSMENT & PLAN:   CAD involving native coronary arteries with exertional dyspnea concerning for anginal equivalent with elevated high-sensitivity troponin: Currently asymptomatic, though she has been more sedentary in the setting of hip pain stemming  from her fall.  Recently admitted and diagnosed with sepsis from UTI with high-sensitivity troponin noted to trend to 275, not cycled thereafter.  Peak uncertain.  It remains unclear if this elevation is related to supply demand ischemia versus ACS.  Given duration since her admission, and in the context of the patient being asymptomatic at this time we deferred further cycling of cardiac enzymes.  We discussed noninvasive and invasive evaluation modalities and have elected to proceed with diagnostic R/LHC given exertional dyspnea concerning for anginal equivalent and in the context of elevated high-sensitivity troponin.  Recommend aggressive risk factor modification and escalation of medical therapy as indicated based on results.  Chronic dyspnea with history of bronchiectasis and pulmonary cryptococcal pneumonia: Pursue R/LHC as outlined above with otherwise ongoing management per PCP and pulmonology for underlying comorbidities.  Lower extremity edema with venous insufficiency: Stable.  Status post ablation by vascular surgery.  HTN: Blood pressure well-controlled in the office today.  Obesity with OSA: This was not discussed at today's visit.   Shared Decision Making/Informed Consent{  The risks [stroke (1 in 1000), death (1 in 1000), kidney failure [usually temporary] (1 in 500), bleeding (1 in 200), allergic reaction [possibly serious] (1 in 200)], benefits (diagnostic support and management of coronary artery disease) and alternatives of a cardiac catheterization were discussed in detail with Ms. Chilton and she is willing to proceed.     Disposition: F/u with Dr. Gollan or an APP after R/LHC.   Medication Adjustments/Labs and Tests Ordered: Current medicines are reviewed at length with the patient today.  Concerns regarding medicines are outlined above. Medication changes, Labs and Tests ordered today are summarized above and listed in the Patient Instructions accessible in Encounters.    Signed, Dorianna Mckiver, PA-C 12/16/2022 4:59 PM     [facility] [place] [address] [place], [place] [address] [phone] 

## 2022-12-16 NOTE — Patient Instructions (Signed)
Medication Instructions:  No changes at this time.   *If you need a refill on your cardiac medications before your next appointment, please call your pharmacy*   Lab Work: CBC & BMET to be done over at the [facility] entrance. Stop at registration desk to have them done. No appointment is needed.   If you have labs (blood work) drawn today and your tests are completely normal, you will receive your results only by: MyChart Message (if you have MyChart) OR A paper copy in the mail If you have any lab test that is abnormal or we need to change your treatment, we will call you to review the results.   Testing/Procedures:  [place] [facility] DEPT OF [facility] AT [place] [address] [place] [address] Dept: [phone] Loc: [phone]  MALIBU ARIZOLA  12/16/2022  You are scheduled for a Cardiac Catheterization on ____, ____  ____ with Dr. Pending.  1. Please arrive at the [facility] Entrance of [facility], [address], [place] [address] at PENDING (This is 2 hour(s) prior to your procedure time).  Proceed to the Check-In Desk directly inside the entrance.  Procedure Parking: Use the entrance off of the [address] side of the hospital. Turn right upon entering and follow the driveway to parking that is directly in front of the [facility] Vascular Center. There is no valet parking available at this entrance, however there is an awning directly in front of the [facility] & Vascular Center for drop off/ pick up for patients.  Special note: Every effort is made to have your procedure done on time. Please understand that emergencies sometimes delay scheduled procedures.  2. Diet: Do not eat solid foods after midnight.  The patient may have clear liquids until 5am upon the day of the procedure.  3. Medication instructions in preparation for your procedure:   Contrast Allergy: No   On  the morning of your procedure, take your Aspirin 81 mg and any morning medicines NOT listed above.  You may use sips of water.  5. Plan to go home the same day, you will only stay overnight if medically necessary. 6. Bring a current list of your medications and current insurance cards. 7. You MUST have a responsible person to drive you home. 8. Someone MUST be with you the first 24 hours after you arrive home or your discharge will be delayed. 9. Please wear clothes that are easy to get on and off and wear slip-on shoes.  Thank you for allowing [place] to care for you!   -- [facility]    Follow-Up: At [facility], you and your health needs are our priority.  As part of our continuing mission to provide you with exceptional heart care, we have created designated Provider Care Teams.  These Care Teams include your primary Cardiologist (physician) and Advanced Practice Providers (APPs -  Physician Assistants and Nurse Practitioners) who all work together to provide you with the care you need, when you need it.   Your next appointment:   2 week(s) after her procedure  Provider:   Julien Nordmann, MD or Eula Listen, PA-C

## 2022-12-16 NOTE — Progress Notes (Signed)
Cardiology Office Note    Date:  12/16/2022   ID:  Nicole Bass, DOB [birth year], MRN [id number]  PCP:  Marguarite Arbour, MD  Cardiologist:  Julien Nordmann, MD  Electrophysiologist:  None   Chief Complaint: Follow-up  History of Present Illness:   Nicole Bass is a 72 y.o. female with history of coronary artery calcification, HTN, RA, OSA on CPAP, chronic dyspnea with pulmonary mycosis with cryptococcus pneumonia, bronchiectasis, venous insufficiency status post ablation, OSA, peripheral neuropathy, and cervical and lumbar degenerative disc disease who presents for follow-up of exertional dyspnea and elevated troponin.  Prior CT of the chest for evaluation of sternal pain in the setting of a mechanical fall showed coronary calcification involving the LAD, LCx, and RCA as well as punctuated calcified granuloma of the right middle lobe, left upper lobe tiny punctate calcified granuloma, and suspected distal bronchiectasis with mild airway plugging in the left lower lobe.  She underwent Lexiscan MPI in 06/2020 which showed no evidence of scar or ischemia with an LVEF of 75%.  PFTs completed reportedly abnormal.  She was admitted to the hospital in 2022 for syncope following back surgery.  There were significant electrolyte derangements noted with elevated D-dimer as well.  CTA chest negative for PE.  Subsequent outpatient cardiac monitoring showed a predominant rhythm of sinus rhythm with a mean heart rate of 92 bpm, sinus tachycardia, and NSVT up to 14 beats.  In follow-up, she has continued to note shortness of breath and worsening lower extremity edema.  In this setting, she underwent echo from 05/2022 showed an EF of 60 to 65%, no regional wall motion analysis, grade 1 diastolic dysfunction, normal RV systolic function and ventricular cavity size, no significant valvular abnormalities, and an estimated right atrial pressure of 3 mmHg.  She was most recently seen in our office on 11/21/2022  and was without symptoms of angina or cardiac decompensation.  No further workup was pursued at that time.  She was admitted to the hospital from 4/23-4/25/2024 after presenting from home with dizziness, weakness, and left rib cage pain after having sustained a fall several days prior.  She was treated for severe sepsis presumed to be in the setting of UTI.  High-sensitivity troponin 52 to trend down to 275.  BNP 102.  PCT 7.13.  Lactic acid 3.6 trending to 3.0.  Chest x-ray showed chronic interstitial coarsening without acute cardiopulmonary process.  CTA chest showed no evidence of PE but some enlargement of the main pulmonary artery along with dependent bandlike opacities in the bases favored to be atelectasis and a stable nodular infiltrative areas in the right upper.  She was managed by the hospitalist service.  She follow-up with her PCP on 12/13/2022 noting ongoing orthopedic discomfort from her fall as well as shortness of breath.  Chest x-ray was recommended.  She was referred to cardiology given dyspnea and elevated troponins noted during hospital admission.  She comes in accompanied by her husband today.  She reports a several month history, possibly 1 year of exertional dyspnea and fatigue.  No frank chest pain.  She indicates that she cannot, provide of stairs without having to stop and rest secondary to dyspnea.  No associated palpitations, diaphoresis, nausea, or vomiting.  No prior ischemic evaluation.  She words for a while, her dyspnea did improve following treatment of underlying anxiety, though of medication, her dyspnea has worsened.  She was also noted to have elevated high-sensitivity troponin trending to 275, not cycled thereafter.  She continues to be in pain involving her hip following her fall.  She remains unclear as to what led to this most recent fall.  She reports she was standing in the kitchen by the trash can and that her legs gave out from under her.  She denies LOC.  No chest  pain or palpitations preceding or following episode.  She has chronic lower extremity swelling.  No progressive orthopnea.  She reports her mother passed from an MI at age 81.   Labs independently reviewed: 11/2022 - A1c 6.4, Hgb 9.3, PLT 291, TC 142, TG 175, HDL 50, LDL 98, potassium 3.4 BUN 11, serum creatinine 1.0, albumin 3.6, AST/ALT normal, TSH normal  Past Medical History:  Diagnosis Date   Anemia    iron everyday, 3 iron infusions last one August 2020   Anxiety    nervous   Arthritis    rheumatoid   Collagen vascular disease (HCC)    Crohn disease (HCC)    Crohn's disease (HCC)    Crohn's disease (HCC)    DDD (degenerative disc disease), cervical    DDD (degenerative disc disease), cervical    DDD (degenerative disc disease), cervical 2003   DDD (degenerative disc disease), cervical    DDD (degenerative disc disease), lumbar    Degenerative disc disease, cervical    Depression    Diarrhea    Dyspnea    out of shape   Fatty liver    GERD (gastroesophageal reflux disease)    Hand weakness    left hand worse, small tremors   Headache    with accident   History of chicken pox    History of claustrophobia    History of kidney stones    History of shingles    HOH (hard of hearing)    left ear   Immune deficiency disorder (HCC)    Lymphatic disorder    Meniere's disease    Motion sickness    Nasal fracture 03/27/2020   due to fall   Neuromuscular disorder (HCC)    Neuropathy    legs   Peripheral neuropathy    Pyelonephritis    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HCC)    UTI (urinary tract infection)    HO   Vertigo    can be accompanied by nausea   Wears dentures    upper dentures    Past Surgical History:  Procedure Laterality Date   ABDOMINAL HYSTERECTOMY     APPENDECTOMY     BACK SURGERY     x 3   CERVICAL SPINE SURGERY     metal plate   CESAREAN SECTION  1976 & 1989   X 2   CHOLECYSTECTOMY     CLOSED REDUCTION NASAL FRACTURE N/A 04/09/2020   Procedure:  CLOSED REDUCTION NASAL FRACTURE;  Surgeon: Vernie Murders, MD;  Location: [facility];  Service: ENT;  Laterality: N/A;   COLON SURGERY  1999   removed 18 inches of colon,removed appendix   COLONOSCOPY     COLONOSCOPY WITH PROPOFOL N/A 08/02/2021   Procedure: COLONOSCOPY WITH PROPOFOL;  Surgeon: Regis Bill, MD;  Location: [facility];  Service: Endoscopy;  Laterality: N/A;   dental implants     3 teeth/ wears dentures   DILATION AND CURETTAGE OF UTERUS  2004   ELBOW FRACTURE SURGERY Left 2008   FIBEROPTIC BRONCHOSCOPY N/A 06/01/2020   Procedure: BEDSIDE BRONCHOSCOPY FIBEROPTIC;  Surgeon: Vida Rigger, MD;  Location: [facility];  Service: Thoracic;  Laterality: N/A;   FRACTURE  SURGERY     JOINT REPLACEMENT Right 2015   partial knee replacement   KYPHOPLASTY     KYPHOPLASTY N/A 04/14/2015   Procedure: KYPHOPLASTY;  Surgeon: Kennedy Bucker, MD;  Location: [facility];  Service: Orthopedics;  Laterality: N/A;   KYPHOPLASTY N/A 06/04/2015   Procedure: KYPHOPLASTY L-5;  Surgeon: Kennedy Bucker, MD;  Location: [facility];  Service: Orthopedics;  Laterality: N/A;   KYPHOPLASTY     LUMBAR LAMINECTOMY FOR EPIDURAL ABSCESS N/A 10/12/2020   Procedure: SYNOVIAL CYST RESECTION;  Surgeon: Lucy Chris, MD;  Location: [facility];  Service: Neurosurgery;  Laterality: N/A;   MAXIMUM ACCESS (MAS) TRANSFORAMINAL LUMBAR INTERBODY FUSION (TLIF) 1 LEVEL Left 10/12/2020   Procedure: OPEN L5/S1 TRANSFORAMINAL LUMBAR INTERBODY FUSION (TLIF) 1 LEVEL;  Surgeon: Lucy Chris, MD;  Location: [facility];  Service: Neurosurgery;  Laterality: Left;   MEDIAL PARTIAL KNEE REPLACEMENT Right 2015   NECK/PLATE  6045   OOPHORECTOMY Bilateral    ORIF NASAL FRACTURE N/A 04/09/2020   Procedure: OPEN REDUCTION INTERNAL FIXATION (ORIF) NASAL FRACTURE;  Surgeon: Vernie Murders, MD;  Location: [facility];  Service: ENT;  Laterality: N/A;   REPAIR EXTENSOR TENDON Left 01/08/2019   Procedure: Realignment  EXTENSOR TENDON;   Surgeon: Kennedy Bucker, MD;  Location: [facility];  Service: Orthopedics;  Laterality: Left;   REVERSE SHOULDER ARTHROPLASTY Right 04/18/2019   Procedure: REVERSE SHOULDER ARTHROPLASTY;  Surgeon: Christena Flake, MD;  Location: [facility];  Service: Orthopedics;  Laterality: Right;   SHOULDER ARTHROSCOPY WITH ROTATOR CUFF REPAIR AND SUBACROMIAL DECOMPRESSION Right 04/13/2016   Procedure: SHOULDER ARTHROSCOPY WITH ROTATOR CUFF REPAIR AND SUBACROMIAL DECOMPRESSION, release of long head biceps tendon;  Surgeon: Erin Sons, MD;  Location: [facility];  Service: Orthopedics;  Laterality: Right;   TOE SURGERY Right 2013   TONSILLECTOMY  1995   UPPER GI ENDOSCOPY     WRIST FRACTURE SURGERY Bilateral     Current Medications: Current Meds  Medication Sig   acetaminophen (TYLENOL) 650 MG CR tablet Take by mouth.   alendronate (FOSAMAX) 70 MG tablet Take 70 mg by mouth every Monday. Take with a full glass of water on an empty stomach.   atropine 1 % ophthalmic solution Place into the right eye.   cetirizine (ZYRTEC) 10 MG tablet Take 10 mg by mouth daily.   chlorhexidine (PERIDEX) 0.12 % solution Use as directed 15 mLs in the mouth or throat as needed (mouth sores).   cholecalciferol (VITAMIN D3) 25 MCG (1000 UT) tablet Take 1,000 Units by mouth daily.   citalopram (CELEXA) 10 MG tablet Take 1 tablet by mouth daily.   cyanocobalamin (,VITAMIN B-12,) 1000 MCG/ML injection Inject 1,000 mcg into the muscle every 30 (thirty) days.   dorzolamide (TRUSOPT) 2 % ophthalmic solution Apply to eye.   Ferrous Sulfate (IRON PO) Take 1 tablet by mouth daily.   gabapentin (NEURONTIN) 100 MG capsule Take 100 mg by mouth every morning.   gabapentin (NEURONTIN) 400 MG capsule Take 400 mg by mouth at bedtime.   HYDROcodone-acetaminophen (NORCO) 10-325 MG tablet Take 1 tablet by mouth every 6 (six) hours as needed.   magnesium oxide (MAG-OX) 400 MG tablet Take 400 mg by mouth every Monday, Wednesday, and Friday.   Multiple  Vitamin (MULTIVITAMIN WITH MINERALS) TABS tablet Take 1 tablet by mouth daily.   nortriptyline (PAMELOR) 10 MG capsule Take by mouth.   nystatin cream (MYCOSTATIN) Apply topically.   omeprazole (PRILOSEC) 40 MG capsule Take 40 mg by mouth daily before breakfast.  potassium chloride SA (KLOR-CON M) 20 MEQ tablet Take 20 mEq by mouth daily.   prednisoLONE acetate (PRED FORTE) 1 % ophthalmic suspension Prednisolone Taper in the right eye: One drop every hour while awake for 1 week, One drop every 2 hours while awake for 1 week, One drop every 3 hours while awake for 1 week, One drop 4 times a day - stay at this dose   predniSONE (DELTASONE) 1 MG tablet Take 3 mg by mouth daily with breakfast.    Probiotic Product (PROBIOTIC PO) Take 1 capsule by mouth daily. Culturelle Probiotic   valACYclovir (VALTREX) 500 MG tablet Take 1 tablet twice daily for 5 days for flares. Start at first sign of symptoms.    Allergies:   Amoxicillin-pot clavulanate, Ciprofloxacin, Levofloxacin, Methotrexate, Methotrexate derivatives, Other, Sulfa antibiotics, Rosuvastatin, Trelegy ellipta [fluticasone-umeclidin-vilant], Cefprozil, Doxycycline, Enbrel [etanercept], Humira [adalimumab], Hydrocodone-acetaminophen, Lorabid [loracarbef], Meropenem, Oxycodone, Oxycodone-acetaminophen, and Primidone   Social History   Socioeconomic History   Marital status: Married    Spouse name: Not on file   Number of children: Not on file   Years of education: Not on file   Highest education level: Not on file  Occupational History   Not on file  Tobacco Use   Smoking status: Never   Smokeless tobacco: Never  Vaping Use   Vaping Use: Never used  Substance and Sexual Activity   Alcohol use: No   Drug use: No   Sexual activity: Yes    Birth control/protection: Surgical  Other Topics Concern   Not on file  Social History Narrative   Not on file   Social Determinants of Health   Financial Resource Strain: Unknown (08/23/2018)    Overall Financial Resource Strain (CARDIA)    Difficulty of Paying Living Expenses: Patient declined  Food Insecurity: No Food Insecurity (12/06/2022)   Hunger Vital Sign    Worried About Running Out of Food in the Last Year: Never true    Ran Out of Food in the Last Year: Never true  Transportation Needs: No Transportation Needs (12/06/2022)   PRAPARE - Administrator, Civil Service (Medical): No    Lack of Transportation (Non-Medical): No  Physical Activity: Unknown (08/23/2018)   Exercise Vital Sign    Days of Exercise per Week: Patient declined    Minutes of Exercise per Session: Patient declined  Stress: Unknown (08/23/2018)   [employer] of Occupational Health - Occupational Stress Questionnaire    Feeling of Stress : Patient declined  Social Connections: Unknown (08/23/2018)   Social Connection and Isolation Panel [NHANES]    Frequency of Communication with Friends and Family: Patient declined    Frequency of Social Gatherings with Friends and Family: Patient declined    Attends Religious Services: Patient declined    Database administrator or Organizations: Patient declined    Attends Banker Meetings: Patient declined    Marital Status: Patient declined     Family History:  The patient's family history includes Colon cancer in her maternal grandfather; Diabetes in her mother; Emphysema in her father and mother. There is no history of Breast cancer or Ovarian cancer.  ROS:   12-point review of systems is negative unless otherwise noted in the HPI.   EKGs/Labs/Other Studies Reviewed:    Studies reviewed were summarized above. The additional studies were reviewed today:  2D echo 05/27/2022: 1. Left ventricular ejection fraction, by estimation, is 60 to 65%. The  left ventricle has normal function. The left  ventricle has no regional  wall motion abnormalities. Left ventricular diastolic parameters are  consistent with Grade I diastolic   dysfunction (impaired relaxation).   2. Right ventricular systolic function is normal. The right ventricular  size is normal.   3. The mitral valve is normal in structure. No evidence of mitral valve  regurgitation. No evidence of mitral stenosis.   4. The aortic valve is tricuspid. Aortic valve regurgitation is not  visualized. No aortic stenosis is present.   5. The inferior vena cava is normal in size with greater than 50%  respiratory variability, suggesting right atrial pressure of 3 mmHg.   Comparison(s): Previous LVEF reported as 50-55%.  __________  2D echo 11/11/2020: 1. Left ventricular ejection fraction, by estimation, is 50 to 55%. The  left ventricle has low normal function. The left ventricle has no regional  wall motion abnormalities. Left ventricular diastolic parameters were  normal.   2. Right ventricular systolic function is normal. The right ventricular  size is normal.   3. The mitral valve is normal in structure. Trivial mitral valve  regurgitation.   4. The aortic valve is normal in structure. Aortic valve regurgitation is  not visualized.  __________  2D echo with bubble 08/25/2018: - Left ventricle: Systolic function was normal. The estimated    ejection fraction was in the range of 55% to 60%.  - Atrial septum: Echo contrast study showed no right-to-left atrial    level shunt, at baseline or with provocation.   Impressions:   - No cardiac source of emboli was indentified.     EKG:  EKG is ordered today.  The EKG ordered today demonstrates NSR, 86 bpm, poor R wave progression along the precordial leads, no acute ST-T changes  Recent Labs: 12/06/2022: ALT 31; B Natriuretic Peptide 102.5 12/07/2022: Hemoglobin 8.6; Magnesium 1.9; Platelets 157 12/08/2022: BUN 18; Creatinine, Ser 0.96; Potassium 3.6; Sodium 140  Recent Lipid Panel    Component Value Date/Time   CHOL 136 12/06/2022 1106   TRIG 70 12/06/2022 1106   HDL 64 12/06/2022 1106   CHOLHDL  2.1 12/06/2022 1106   VLDL 14 12/06/2022 1106   LDLCALC 58 12/06/2022 1106    PHYSICAL EXAM:    VS:  BP 116/71 (BP Location: Right Arm, Patient Position: Sitting)   Pulse 86   Ht 5\' 2"  (1.575 m)   Wt 174 lb (78.9 kg)   LMP  (LMP Unknown)   SpO2 93%   BMI 31.83 kg/m   BMI: Body mass index is 31.83 kg/m.  Physical Exam Vitals reviewed.  Constitutional:      Appearance: She is well-developed.  HENT:     Head: Normocephalic and atraumatic.  Eyes:     General:        Right eye: No discharge.        Left eye: No discharge.  Neck:     Vascular: No JVD.  Cardiovascular:     Rate and Rhythm: Normal rate and regular rhythm.     Heart sounds: Normal heart sounds, S1 normal and S2 normal. Heart sounds not distant. No midsystolic click and no opening snap. No murmur heard.    No friction rub.  Pulmonary:     Effort: Pulmonary effort is normal. No respiratory distress.     Breath sounds: Normal breath sounds. No decreased breath sounds, wheezing or rales.  Chest:     Chest wall: No tenderness.  Abdominal:     General: There is no distension.  Musculoskeletal:  Cervical back: Normal range of motion.     Right lower leg: Edema present.     Left lower leg: Edema present.  Skin:    General: Skin is warm and dry.     Nails: There is no clubbing.  Neurological:     Mental Status: She is alert and oriented to person, place, and time.  Psychiatric:        Speech: Speech normal.        Behavior: Behavior normal.        Thought Content: Thought content normal.        Judgment: Judgment normal.     Wt Readings from Last 3 Encounters:  12/16/22 174 lb (78.9 kg)  12/07/22 184 lb 6.4 oz (83.6 kg)  11/24/22 172 lb (78 kg)     ASSESSMENT & PLAN:   CAD involving native coronary arteries with exertional dyspnea concerning for anginal equivalent with elevated high-sensitivity troponin: Currently asymptomatic, though she has been more sedentary in the setting of hip pain stemming  from her fall.  Recently admitted and diagnosed with sepsis from UTI with high-sensitivity troponin noted to trend to 275, not cycled thereafter.  Peak uncertain.  It remains unclear if this elevation is related to supply demand ischemia versus ACS.  Given duration since her admission, and in the context of the patient being asymptomatic at this time we deferred further cycling of cardiac enzymes.  We discussed noninvasive and invasive evaluation modalities and have elected to proceed with diagnostic R/LHC given exertional dyspnea concerning for anginal equivalent and in the context of elevated high-sensitivity troponin.  Recommend aggressive risk factor modification and escalation of medical therapy as indicated based on results.  Chronic dyspnea with history of bronchiectasis and pulmonary cryptococcal pneumonia: Pursue R/LHC as outlined above with otherwise ongoing management per PCP and pulmonology for underlying comorbidities.  Lower extremity edema with venous insufficiency: Stable.  Status post ablation by vascular surgery.  HTN: Blood pressure well-controlled in the office today.  Obesity with OSA: This was not discussed at today's visit.   Shared Decision Making/Informed Consent{  The risks [stroke (1 in 1000), death (1 in 1000), kidney failure [usually temporary] (1 in 500), bleeding (1 in 200), allergic reaction [possibly serious] (1 in 200)], benefits (diagnostic support and management of coronary artery disease) and alternatives of a cardiac catheterization were discussed in detail with Nicole Bass and she is willing to proceed.     Disposition: F/u with Dr. Mariah Milling or an APP after [facility].   Medication Adjustments/Labs and Tests Ordered: Current medicines are reviewed at length with the patient today.  Concerns regarding medicines are outlined above. Medication changes, Labs and Tests ordered today are summarized above and listed in the Patient Instructions accessible in Encounters.    Signed, Eula Listen, PA-C 12/16/2022 4:59 PM      [facility] [address] [place], [place] [address] [phone]

## 2022-12-19 ENCOUNTER — Telehealth: Payer: Self-pay

## 2022-12-19 ENCOUNTER — Other Ambulatory Visit
Admission: RE | Admit: 2022-12-19 | Discharge: 2022-12-19 | Disposition: A | Discharge status: Home / Self Care | Payer: Medicare Other | Attending: Physician Assistant | Admitting: Physician Assistant

## 2022-12-19 ENCOUNTER — Telehealth: Payer: Self-pay | Admitting: Physician Assistant

## 2022-12-19 DIAGNOSIS — R7989 Other specified abnormal findings of blood chemistry: Secondary | ICD-10-CM | POA: Diagnosis present

## 2022-12-19 DIAGNOSIS — I25118 Atherosclerotic heart disease of native coronary artery with other forms of angina pectoris: Secondary | ICD-10-CM | POA: Insufficient documentation

## 2022-12-19 DIAGNOSIS — R0609 Other forms of dyspnea: Secondary | ICD-10-CM | POA: Diagnosis present

## 2022-12-19 LAB — CBC
HCT: 29.7 % — ABNORMAL LOW (ref 36.0–46.0)
Hemoglobin: 9.6 g/dL — ABNORMAL LOW (ref 12.0–15.0)
MCH: 31.7 pg (ref 26.0–34.0)
MCHC: 32.3 g/dL (ref 30.0–36.0)
MCV: 98 fL (ref 80.0–100.0)
Platelets: 340 10*3/uL (ref 150–400)
RBC: 3.03 MIL/uL — ABNORMAL LOW (ref 3.87–5.11)
RDW: 13.2 % (ref 11.5–15.5)
WBC: 4.9 10*3/uL (ref 4.0–10.5)
nRBC: 0 % (ref 0.0–0.2)

## 2022-12-19 LAB — BASIC METABOLIC PANEL
Anion gap: 10 (ref 5–15)
BUN: 12 mg/dL (ref 8–23)
CO2: 26 mmol/L (ref 22–32)
Calcium: 8.8 mg/dL — ABNORMAL LOW (ref 8.9–10.3)
Chloride: 104 mmol/L (ref 98–111)
Creatinine, Ser: 0.87 mg/dL (ref 0.44–1.00)
GFR, Estimated: >60 mL/min (ref >60)
Glucose, Bld: 116 mg/dL — ABNORMAL HIGH (ref 70–99)
Potassium: 3.1 mmol/L — ABNORMAL LOW (ref 3.5–5.1)
Sodium: 140 mmol/L (ref 135–145)

## 2022-12-19 NOTE — Telephone Encounter (Signed)
Spoke with patient and reviewed dates and times available for her heart cath. She preferred Tuesday May 14 at 09:30 am and was scheduled for that time. Instructed her to arrive at 08:30  am that day and if she should have any questions to please give [place] a call back. She verbalized understanding of our conversation with no further questions at this time.

## 2022-12-19 NOTE — Telephone Encounter (Signed)
Pt is not feeling well. Pt states MD told her no to drink milk. Pt has  not had  any milk this whole week . Pt is now nausea. Pt would like a call back. Pt is on her way to get labs now.

## 2022-12-19 NOTE — Telephone Encounter (Signed)
Left voicemail message to call back  

## 2022-12-20 ENCOUNTER — Other Ambulatory Visit: Payer: Self-pay | Admitting: *Deleted

## 2022-12-20 ENCOUNTER — Other Ambulatory Visit: Payer: Self-pay | Admitting: Physician Assistant

## 2022-12-20 DIAGNOSIS — I25118 Atherosclerotic heart disease of native coronary artery with other forms of angina pectoris: Secondary | ICD-10-CM

## 2022-12-20 DIAGNOSIS — E876 Hypokalemia: Secondary | ICD-10-CM

## 2022-12-20 MED ORDER — POTASSIUM CHLORIDE CRYS ER 20 MEQ PO TBCR: 40.0000 meq | EXTENDED_RELEASE_TABLET | Freq: Every day | ORAL | 3 refills | Status: DC

## 2022-12-20 NOTE — Progress Notes (Signed)
Orders for [facility] have been signed and held.

## 2022-12-20 NOTE — Telephone Encounter (Signed)
Pt report she has noticed some improvement since holding milk with a decrease in abd bloating, gas , and diarrhea, but recently she had and an issue with diarrhea all day and it was explosive at times.

## 2022-12-20 NOTE — H&P (View-Only) (Signed)
Orders for R/LHC have been signed and held. 

## 2022-12-21 NOTE — Telephone Encounter (Signed)
Pt is aware as instructed, expressed understanding

## 2022-12-23 ENCOUNTER — Telehealth: Payer: Self-pay | Admitting: Cardiovascular Disease

## 2022-12-23 NOTE — Telephone Encounter (Signed)
Patient is calling requesting a call to go over what she needs to do as well as how to take her meds prior to her heart cath. Please advise.

## 2022-12-23 NOTE — Telephone Encounter (Signed)
Spoke with patient and reviewed instructions for her heart cath. She wanted to know if she should hold her fluid pill. I do not see that listed on her medication list. Inquired the dosage and frequency in which she takes that medication. She take Furosemide 20 mg once daily. Updated med list to reflect that medication and advised that she does hold that medication that morning. She verbalized understanding of our conversation, instructions, recommendations, and confirmed date and time. No further questions at this time.

## 2022-12-26 ENCOUNTER — Other Ambulatory Visit
Admission: RE | Admit: 2022-12-26 | Discharge: 2022-12-26 | Disposition: A | Discharge status: Home / Self Care | Payer: Medicare Other | Attending: Physician Assistant | Admitting: Physician Assistant

## 2022-12-26 ENCOUNTER — Telehealth: Payer: Self-pay

## 2022-12-26 DIAGNOSIS — I25118 Atherosclerotic heart disease of native coronary artery with other forms of angina pectoris: Secondary | ICD-10-CM | POA: Diagnosis present

## 2022-12-26 DIAGNOSIS — I10 Essential (primary) hypertension: Secondary | ICD-10-CM

## 2022-12-26 DIAGNOSIS — I5032 Chronic diastolic (congestive) heart failure: Secondary | ICD-10-CM

## 2022-12-26 DIAGNOSIS — E876 Hypokalemia: Secondary | ICD-10-CM | POA: Diagnosis present

## 2022-12-26 LAB — BASIC METABOLIC PANEL
Anion gap: 10 (ref 5–15)
BUN: 13 mg/dL (ref 8–23)
CO2: 22 mmol/L (ref 22–32)
Calcium: 9 mg/dL (ref 8.9–10.3)
Chloride: 108 mmol/L (ref 98–111)
Creatinine, Ser: 0.95 mg/dL (ref 0.44–1.00)
GFR, Estimated: >60 mL/min (ref >60)
Glucose, Bld: 151 mg/dL — ABNORMAL HIGH (ref 70–99)
Potassium: 3 mmol/L — ABNORMAL LOW (ref 3.5–5.1)
Sodium: 140 mmol/L (ref 135–145)

## 2022-12-26 NOTE — Telephone Encounter (Signed)
The patient has been notified of the result and verbalized understanding.  All questions (if any) were answered. Frutoso Schatz, RN 12/26/2022 1:09 PM

## 2022-12-26 NOTE — Telephone Encounter (Signed)
-----   Message from Sondra Barges, PA-C sent at 12/26/2022 12:37 PM EDT ----- Potassium remains low.  Kidney function normal.  Random glucose mildly elevated.  Recommendations: -Patient currently takes KCl 40 mEq daily, please have her take an additional KCl 60 mEq this afternoon and again on the morning of 5/14, followed by resumption of 40 mEq daily thereafter -She will need a BMP when she arrives to specials prior to undergoing cardiac cath to ensure potassium is at least 3.5 prior to sedation

## 2022-12-27 ENCOUNTER — Ambulatory Visit
Admission: RE | Admit: 2022-12-27 | Discharge: 2022-12-27 | Disposition: A | Discharge status: Home / Self Care | Payer: Medicare Other | Attending: Internal Medicine | Admitting: Internal Medicine

## 2022-12-27 ENCOUNTER — Encounter
Admission: RE | Disposition: A | Discharge status: Home / Self Care | Payer: Self-pay | Source: Home / Self Care | Attending: Internal Medicine

## 2022-12-27 ENCOUNTER — Encounter: Payer: Self-pay | Admitting: Internal Medicine

## 2022-12-27 ENCOUNTER — Other Ambulatory Visit: Payer: Self-pay

## 2022-12-27 DIAGNOSIS — I2510 Atherosclerotic heart disease of native coronary artery without angina pectoris: Secondary | ICD-10-CM | POA: Diagnosis not present

## 2022-12-27 DIAGNOSIS — I872 Venous insufficiency (chronic) (peripheral): Secondary | ICD-10-CM | POA: Insufficient documentation

## 2022-12-27 DIAGNOSIS — G4733 Obstructive sleep apnea (adult) (pediatric): Secondary | ICD-10-CM | POA: Insufficient documentation

## 2022-12-27 DIAGNOSIS — R0600 Dyspnea, unspecified: Secondary | ICD-10-CM | POA: Diagnosis present

## 2022-12-27 DIAGNOSIS — I509 Heart failure, unspecified: Secondary | ICD-10-CM | POA: Insufficient documentation

## 2022-12-27 DIAGNOSIS — R7989 Other specified abnormal findings of blood chemistry: Secondary | ICD-10-CM

## 2022-12-27 DIAGNOSIS — I110 Hypertensive heart disease with heart failure: Secondary | ICD-10-CM | POA: Insufficient documentation

## 2022-12-27 DIAGNOSIS — R0609 Other forms of dyspnea: Secondary | ICD-10-CM | POA: Insufficient documentation

## 2022-12-27 DIAGNOSIS — E669 Obesity, unspecified: Secondary | ICD-10-CM | POA: Insufficient documentation

## 2022-12-27 DIAGNOSIS — Z6831 Body mass index (BMI) 31.0-31.9, adult: Secondary | ICD-10-CM | POA: Insufficient documentation

## 2022-12-27 HISTORY — PX: RIGHT/LEFT HEART CATH AND CORONARY ANGIOGRAPHY: CATH118266

## 2022-12-27 LAB — POCT I-STAT 7, (LYTES, BLD GAS, ICA,H+H)
Acid-base deficit: 5 mmol/L — ABNORMAL HIGH (ref 0.0–2.0)
Bicarbonate: 19.6 mmol/L — ABNORMAL LOW (ref 20.0–28.0)
Calcium, Ion: 1.28 mmol/L (ref 1.15–1.40)
HCT: 25 % — ABNORMAL LOW (ref 36.0–46.0)
Hemoglobin: 8.5 g/dL — ABNORMAL LOW (ref 12.0–15.0)
O2 Saturation: 97 %
Potassium: 3.8 mmol/L (ref 3.5–5.1)
Sodium: 141 mmol/L (ref 135–145)
TCO2: 21 mmol/L — ABNORMAL LOW (ref 22–32)
pCO2 arterial: 34.3 mmHg (ref 32–48)
pH, Arterial: 7.364 (ref 7.35–7.45)
pO2, Arterial: 93 mmHg (ref 83–108)

## 2022-12-27 LAB — BASIC METABOLIC PANEL
Anion gap: 10 (ref 5–15)
BUN: 12 mg/dL (ref 8–23)
CO2: 19 mmol/L — ABNORMAL LOW (ref 22–32)
Calcium: 9.1 mg/dL (ref 8.9–10.3)
Chloride: 111 mmol/L (ref 98–111)
Creatinine, Ser: 0.83 mg/dL (ref 0.44–1.00)
GFR, Estimated: >60 mL/min (ref >60)
Glucose, Bld: 97 mg/dL (ref 70–99)
Potassium: 4.2 mmol/L (ref 3.5–5.1)
Sodium: 140 mmol/L (ref 135–145)

## 2022-12-27 SURGERY — RIGHT/LEFT HEART CATH AND CORONARY ANGIOGRAPHY
Anesthesia: Moderate Sedation | Laterality: Bilateral

## 2022-12-27 MED ORDER — ACETAMINOPHEN 325 MG PO TABS: 650.0000 mg | ORAL_TABLET | ORAL | Status: DC | PRN

## 2022-12-27 MED ORDER — HYDRALAZINE HCL 20 MG/ML IJ SOLN: 10.0000 mg | INTRAMUSCULAR | Status: DC | PRN

## 2022-12-27 MED ORDER — SODIUM CHLORIDE 0.9 % IV SOLN: 250.0000 mL | INTRAVENOUS | Status: DC | PRN

## 2022-12-27 MED ORDER — SODIUM CHLORIDE 0.9% FLUSH: 3.0000 mL | Freq: Two times a day (BID) | INTRAVENOUS | Status: DC

## 2022-12-27 MED ORDER — IOHEXOL 300 MG/ML  SOLN
INTRAMUSCULAR | Status: DC | PRN
  Administered 2022-12-27: 50 mL

## 2022-12-27 MED ORDER — VERAPAMIL HCL 2.5 MG/ML IV SOLN
INTRAVENOUS | Status: AC
  Filled 2022-12-27: qty 2

## 2022-12-27 MED ORDER — MIDAZOLAM HCL 2 MG/2ML IJ SOLN
INTRAMUSCULAR | Status: DC | PRN
  Administered 2022-12-27 (×3): 1 mg via INTRAVENOUS

## 2022-12-27 MED ORDER — FENTANYL CITRATE (PF) 100 MCG/2ML IJ SOLN
INTRAMUSCULAR | Status: DC | PRN
  Administered 2022-12-27 (×3): 25 ug via INTRAVENOUS

## 2022-12-27 MED ORDER — ASPIRIN 81 MG PO CHEW
CHEWABLE_TABLET | ORAL | Status: AC
  Filled 2022-12-27: qty 1

## 2022-12-27 MED ORDER — ASPIRIN 81 MG PO CHEW: 81.0000 mg | CHEWABLE_TABLET | ORAL | Status: DC

## 2022-12-27 MED ORDER — SODIUM CHLORIDE 0.9 % IV SOLN: INTRAVENOUS | Status: DC

## 2022-12-27 MED ORDER — VERAPAMIL HCL 2.5 MG/ML IV SOLN
INTRAVENOUS | Status: DC | PRN
  Administered 2022-12-27 (×2): 2.5 mg via INTRA_ARTERIAL

## 2022-12-27 MED ORDER — LABETALOL HCL 5 MG/ML IV SOLN: 10.0000 mg | INTRAVENOUS | Status: DC | PRN

## 2022-12-27 MED ORDER — MIDAZOLAM HCL 2 MG/2ML IJ SOLN
INTRAMUSCULAR | Status: AC
  Filled 2022-12-27: qty 2

## 2022-12-27 MED ORDER — HEPARIN SODIUM (PORCINE) 1000 UNIT/ML IJ SOLN
INTRAMUSCULAR | Status: AC
  Filled 2022-12-27: qty 10

## 2022-12-27 MED ORDER — HEPARIN (PORCINE) IN NACL 1000-0.9 UT/500ML-% IV SOLN
INTRAVENOUS | Status: AC
  Filled 2022-12-27: qty 1000

## 2022-12-27 MED ORDER — FENTANYL CITRATE (PF) 100 MCG/2ML IJ SOLN
INTRAMUSCULAR | Status: AC
  Filled 2022-12-27: qty 2

## 2022-12-27 MED ORDER — SODIUM CHLORIDE 0.9% FLUSH: 3.0000 mL | INTRAVENOUS | Status: DC | PRN

## 2022-12-27 MED ORDER — HEPARIN (PORCINE) IN NACL 1000-0.9 UT/500ML-% IV SOLN
INTRAVENOUS | Status: DC | PRN
  Administered 2022-12-27 (×2): 500 mL

## 2022-12-27 MED ORDER — HEPARIN SODIUM (PORCINE) 1000 UNIT/ML IJ SOLN
INTRAMUSCULAR | Status: DC | PRN
  Administered 2022-12-27: 4000 [IU] via INTRAVENOUS

## 2022-12-27 SURGICAL SUPPLY — 15 items
CATH 5F 110X4 TIG (CATHETERS) IMPLANT
CATH BALLN WEDGE 5F 110CM (CATHETERS) IMPLANT
CATH INFINITI 5FR ANG PIGTAIL (CATHETERS) IMPLANT
DEVICE RAD TR BAND REGULAR (VASCULAR PRODUCTS) IMPLANT
DRAPE BRACHIAL (DRAPES) IMPLANT
GLIDESHEATH SLEND SS 6F .021 (SHEATH) IMPLANT
GUIDEWIRE .025 260CM (WIRE) IMPLANT
GUIDEWIRE INQWIRE 1.5J.035X260 (WIRE) IMPLANT
INQWIRE 1.5J .035X260CM (WIRE) ×1
PACK CARDIAC CATH (CUSTOM PROCEDURE TRAY) ×1 IMPLANT
PROTECTION STATION PRESSURIZED (MISCELLANEOUS) ×1
SET ATX-X65L (MISCELLANEOUS) IMPLANT
SHEATH GLIDE SLENDER 4/5FR (SHEATH) IMPLANT
STATION PROTECTION PRESSURIZED (MISCELLANEOUS) IMPLANT
WIRE HITORQ VERSACORE ST 145CM (WIRE) IMPLANT

## 2022-12-27 NOTE — Interval H&P Note (Signed)
History and Physical Interval Note:  12/27/2022 7:18 PM  Nicole Bass  has presented today for surgery, with the diagnosis of dyspnea and elevated troponin.  The various methods of treatment have been discussed with the patient and family. After consideration of risks, benefits and other options for treatment, the patient has consented to  Procedure(s): RIGHT/LEFT HEART CATH AND CORONARY ANGIOGRAPHY (Bilateral) as a surgical intervention.  The patient's history has been reviewed, patient examined, no change in status, stable for surgery.  I have reviewed the patient's chart and labs.  Questions were answered to the patient's satisfaction.    Cath Lab Visit (complete for each Cath Lab visit)  Clinical Evaluation Leading to the Procedure:   ACS: Yes.    Non-ACS:  N/A  Tremain Rucinski

## 2022-12-27 NOTE — Brief Op Note (Signed)
BRIEF CARDIAC CATHETERIZATION NOTE  12/27/2022  11:27 AM  PATIENT:  Nicole Bass  72 y.o. female  PRE-OPERATIVE DIAGNOSIS:  Dyspnea and elevated troponin  POST-OPERATIVE DIAGNOSIS:  Same  PROCEDURE:  Procedure(s): RIGHT/LEFT HEART CATH AND CORONARY ANGIOGRAPHY (Bilateral)  SURGEON:  Surgeon(s) and Role:    * Vedanshi Massaro, MD - Primary  FINDINGS: Mild, nonobstructive coronary artery disease. Normal left and right heart filling pressures. Normal Fick cardiac output/index.  RECOMMENDATIONS: Medical therapy and risk factor modification.  Nicole Kendall, MD [facility]

## 2022-12-27 NOTE — Interval H&P Note (Signed)
History and Physical Interval Note:  12/27/2022 10:14 AM  Nicole Bass  has presented today for surgery, with the diagnosis of elevated troponin and dyspnea on exertion.  The various methods of treatment have been discussed with the patient and family. After consideration of risks, benefits and other options for treatment, the patient has consented to  Procedure(s): RIGHT/LEFT HEART CATH AND CORONARY ANGIOGRAPHY (Bilateral) as a surgical intervention.  The patient's history has been reviewed, patient examined, no change in status, stable for surgery.  I have reviewed the patient's chart and labs.  Questions were answered to the patient's satisfaction.    Cath Lab Visit (complete for each Cath Lab visit)  Clinical Evaluation Leading to the Procedure:   ACS: Yes.    Non-ACS:  N/A   Nicole Bass

## 2022-12-28 ENCOUNTER — Encounter: Payer: Self-pay | Admitting: Internal Medicine

## 2022-12-28 LAB — POCT I-STAT EG7
Acid-base deficit: 4 mmol/L — ABNORMAL HIGH (ref 0.0–2.0)
Bicarbonate: 21.5 mmol/L (ref 20.0–28.0)
Calcium, Ion: 1.28 mmol/L (ref 1.15–1.40)
HCT: 27 % — ABNORMAL LOW (ref 36.0–46.0)
Hemoglobin: 9.2 g/dL — ABNORMAL LOW (ref 12.0–15.0)
O2 Saturation: 68 %
Potassium: 3.7 mmol/L (ref 3.5–5.1)
Sodium: 142 mmol/L (ref 135–145)
TCO2: 23 mmol/L (ref 22–32)
pCO2, Ven: 40.4 mmHg — ABNORMAL LOW (ref 44–60)
pH, Ven: 7.334 (ref 7.25–7.43)
pO2, Ven: 37 mmHg (ref 32–45)

## 2022-12-29 ENCOUNTER — Telehealth: Payer: Self-pay | Admitting: Cardiovascular Disease

## 2022-12-29 NOTE — Telephone Encounter (Signed)
I called and spoke with the patient. I have advised her of Eula Listen, PA's recommendations to remove her Tegaderm dressing if it is coming up and wash with warm soapy water.  She may apply a clean bandaid over the site, but no creams/ ointments of any kind of the cath site.   The advised she was told not to shower for 48 hours after her procedure.  I have advised her that she is at her 48 hour window and could shower at this time. She will wash her cath site with warm soapy water and redress this with a bandaid once it is dry. She is aware no tub baths at this time to prevent infection at the cath site.   I have also advised her if she notices any bleeding that is soaking a bandaid, to apply pressure and call the office, even if after hours (she will be directed to the physician on call.  The patient voices understanding and is agreeable.  She was appreciative of the call back.

## 2022-12-29 NOTE — Telephone Encounter (Signed)
Cath ordered by Eula Listen, PA & performed by Dr. Okey Dupre on 12/27/22.   To Nicole Bass to review as I am unsure what type of the "bandage" she is referring to.

## 2022-12-29 NOTE — Telephone Encounter (Signed)
Patient stated the bandage placed at her procedure is coming undone.  Patient wants to know next steps.

## 2022-12-29 NOTE — Telephone Encounter (Signed)
Typically, patients are sent home with gauze and a Tegaderm in place.  If the Tegaderm is coming up, it is okay to remove it.  She should wash with warm soapy water without creams, ointments, or Neosporin.  Okay to place a clean, dry Band-Aid over the arteriotomy site.

## 2023-01-05 DIAGNOSIS — J479 Bronchiectasis, uncomplicated: Secondary | ICD-10-CM | POA: Insufficient documentation

## 2023-01-05 DIAGNOSIS — E860 Dehydration: Secondary | ICD-10-CM | POA: Insufficient documentation

## 2023-01-05 DIAGNOSIS — Z9181 History of falling: Secondary | ICD-10-CM | POA: Insufficient documentation

## 2023-01-05 DIAGNOSIS — E869 Volume depletion, unspecified: Secondary | ICD-10-CM | POA: Insufficient documentation

## 2023-01-05 DIAGNOSIS — R0902 Hypoxemia: Secondary | ICD-10-CM | POA: Insufficient documentation

## 2023-01-10 ENCOUNTER — Ambulatory Visit: Payer: Medicare Other | Admitting: Physician Assistant

## 2023-01-20 ENCOUNTER — Other Ambulatory Visit: Payer: Self-pay

## 2023-01-25 ENCOUNTER — Ambulatory Visit: Payer: Medicare Other | Admitting: Physician Assistant

## 2023-04-03 ENCOUNTER — Ambulatory Visit: Payer: Medicare Other | Admitting: Gastroenterology

## 2023-04-03 ENCOUNTER — Encounter: Payer: Self-pay | Admitting: Gastroenterology

## 2023-04-03 VITALS — BP 127/78 | HR 84 | Temp 97.9°F | Wt 168.0 lb

## 2023-04-03 DIAGNOSIS — R197 Diarrhea, unspecified: Secondary | ICD-10-CM

## 2023-04-03 NOTE — Progress Notes (Signed)
Primary Care Physician: Marguarite Arbour, MD  Primary Gastroenterologist:  Dr. Midge Minium  Chief Complaint  Patient presents with   Follow-up    HPI: Nicole Bass is a 72 y.o. female here with a history of Crohn's disease with and gas and bloating with diarrhea.  The patient was told to avoid milk products and had contacted our office that some of her symptoms had improved.  She was last seen in April of this year.  The patient had been seen previously by Dr. Allegra Lai and was not happy with her care and went to see Dr. Mia Creek and then subsequently transferred to me. The patient has a history of rheumatoid arthritis and is followed by rheumatology.  The patient has had an elevation in her CRP and sedimentation rate.  The patient was in the hospital back in April for a fall and hypotension attributed to urosepsis.  The patient had fractured her left wrist. She now reports urgency and accidents at time.  The patient denies any black stools or bloody stools.  She also denies any significant abdominal pain.  She does partake in dairy products and had cereal this morning.  The patient also reports eating cheese and has lots of fruits.  She was told by a surgeon many years ago to have a cup of yogurt every day for her bowels.  Past Medical History:  Diagnosis Date   Anemia    iron everyday, 3 iron infusions last one August 2020   Anxiety    nervous   Arthritis    rheumatoid   Collagen vascular disease (HCC)    Crohn disease (HCC)    Crohn's disease (HCC)    Crohn's disease (HCC)    DDD (degenerative disc disease), cervical    DDD (degenerative disc disease), cervical    DDD (degenerative disc disease), cervical 2003   DDD (degenerative disc disease), cervical    DDD (degenerative disc disease), lumbar    Degenerative disc disease, cervical    Depression    Diarrhea    Dyspnea    out of shape   Fatty liver    GERD (gastroesophageal reflux disease)    Hand weakness    left  hand worse, small tremors   Headache    with accident   History of chicken pox    History of claustrophobia    History of kidney stones    History of shingles    HOH (hard of hearing)    left ear   Immune deficiency disorder (HCC)    Lymphatic disorder    Meniere's disease    Motion sickness    Nasal fracture 03/27/2020   due to fall   Neuromuscular disorder (HCC)    Neuropathy    legs   OSA (obstructive sleep apnea) 05/11/2022   Peripheral neuropathy    Pyelonephritis    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HCC)    UTI (urinary tract infection)    HO   Vertigo    can be accompanied by nausea   Wears dentures    upper dentures    Current Outpatient Medications  Medication Sig Dispense Refill   acetaminophen (TYLENOL) 650 MG CR tablet Take by mouth.     alendronate (FOSAMAX) 70 MG tablet Take 70 mg by mouth every Monday. Take with a full glass of water on an empty stomach.     aspirin 81 MG chewable tablet Chew 81 mg by mouth daily.     atropine 1 % ophthalmic solution  Place into the right eye.     calcitonin, salmon, (MIACALCIN/FORTICAL) 200 UNIT/ACT nasal spray Place 1 spray into the nose daily.     cetirizine (ZYRTEC) 10 MG tablet Take 10 mg by mouth daily.     chlorhexidine (PERIDEX) 0.12 % solution Use as directed 15 mLs in the mouth or throat as needed (mouth sores).     cholecalciferol (VITAMIN D3) 25 MCG (1000 UT) tablet Take 1,000 Units by mouth daily.     citalopram (CELEXA) 10 MG tablet Take 1 tablet by mouth daily.     cyanocobalamin (,VITAMIN B-12,) 1000 MCG/ML injection Inject 1,000 mcg into the muscle every 30 (thirty) days.     dorzolamide (TRUSOPT) 2 % ophthalmic solution Apply to eye.     Ferrous Sulfate (IRON PO) Take 1 tablet by mouth daily.     furosemide (LASIX) 20 MG tablet Take 20 mg by mouth daily.     gabapentin (NEURONTIN) 100 MG capsule Take 100 mg by mouth every morning.     gabapentin (NEURONTIN) 400 MG capsule Take 400 mg by mouth at bedtime.      magnesium oxide (MAG-OX) 400 MG tablet Take 400 mg by mouth every Monday, Wednesday, and Friday.     Multiple Vitamin (MULTIVITAMIN WITH MINERALS) TABS tablet Take 1 tablet by mouth daily.     Multiple Vitamins-Minerals (MEMORY VITE PO) Take by mouth.     nortriptyline (PAMELOR) 10 MG capsule Take by mouth.     nystatin cream (MYCOSTATIN) Apply topically.     omeprazole (PRILOSEC) 40 MG capsule Take 40 mg by mouth daily before breakfast.     potassium chloride SA (KLOR-CON M) 20 MEQ tablet Take 2 tablets (40 mEq total) by mouth daily. 180 tablet 3   prednisoLONE acetate (PRED FORTE) 1 % ophthalmic suspension Prednisolone Taper in the right eye: One drop every hour while awake for 1 week, One drop every 2 hours while awake for 1 week, One drop every 3 hours while awake for 1 week, One drop 4 times a day - stay at this dose     predniSONE (DELTASONE) 1 MG tablet Take 3 mg by mouth daily with breakfast.      Probiotic Product (PROBIOTIC PO) Take 1 capsule by mouth daily. Culturelle Probiotic     traMADol (ULTRAM) 50 MG tablet Take 50 mg by mouth 2 (two) times daily as needed.     valACYclovir (VALTREX) 500 MG tablet Take 1 tablet twice daily for 5 days for flares. Start at first sign of symptoms. 30 tablet 5   No current facility-administered medications for this visit.    Allergies as of 04/03/2023 - Review Complete 04/03/2023  Allergen Reaction Noted   Amoxicillin-pot clavulanate Diarrhea 06/09/2022   Ciprofloxacin Itching and Rash 08/31/2016   Levofloxacin Hives and Rash 01/08/2014   Methotrexate  11/21/2017   Methotrexate derivatives  11/21/2017   Other Other (See Comments) and Rash 07/16/2013   Sulfa antibiotics Other (See Comments) 07/16/2013   Rosuvastatin Swelling and Other (See Comments) 03/24/2022   Trelegy ellipta [fluticasone-umeclidin-vilant]  09/24/2020   Cefprozil Rash 07/16/2013   Clindamycin/lincomycin Rash 12/27/2022   Doxycycline Rash 03/02/2021   Enbrel [etanercept]  Itching and Rash 04/13/2015   Humira [adalimumab] Itching and Rash 08/04/2015   Hydrocodone-acetaminophen Itching and Other (See Comments) 04/29/2015   Lorabid [loracarbef] Itching and Rash 04/13/2015   Meropenem Itching and Rash 04/13/2015   Oxycodone Itching, Other (See Comments), and Rash 09/06/2017   Oxycodone-acetaminophen Itching 09/06/2017   Primidone  Itching and Rash 04/13/2015    ROS:  General: Negative for anorexia, weight loss, fever, chills, fatigue, weakness. ENT: Negative for hoarseness, difficulty swallowing , nasal congestion. CV: Negative for chest pain, angina, palpitations, dyspnea on exertion, peripheral edema.  Respiratory: Negative for dyspnea at rest, dyspnea on exertion, cough, sputum, wheezing.  GI: See history of present illness. GU:  Negative for dysuria, hematuria, urinary incontinence, urinary frequency, nocturnal urination.  Endo: Negative for unusual weight change.    Physical Examination:   BP 127/78 (BP Location: Left Arm, Patient Position: Sitting, Cuff Size: Normal)   Pulse 84   Temp 97.9 F (36.6 C) (Oral)   Wt 168 lb (76.2 kg)   LMP  (LMP Unknown)   BMI 30.73 kg/m   General: Well-nourished, well-developed in no acute distress.  Eyes: No icterus. Conjunctivae pink. Lungs: Clear to auscultation bilaterally. Non-labored. Heart: Regular rate and rhythm, no murmurs rubs or gallops.  Abdomen: Bowel sounds are normal, nontender, nondistended, no hepatosplenomegaly or masses, no abdominal bruits or hernia , no rebound or guarding.   Extremities: No lower extremity edema. No clubbing or deformities. Neuro: Alert and oriented x 3.  Grossly intact. Skin: Warm and dry, no jaundice.   Psych: Alert and cooperative, normal mood and affect.  Labs:    Imaging Studies: No results found.  Assessment and Plan:   Nicole Bass is a 72 y.o. y/o female who comes in today with a history of Crohn's disease who is now having significant urgency and  diarrhea.  The patient has been told to avoid dairy products for 4 to 5 days to see if her diarrhea gets better.  She has been told that if it does not get better then she can go back on the dairy products and try to limit her fruits which may be contributing to her diarrhea.  The patient denies taking any artificial sweeteners.  She has also been told that if stopping the fruits and milk do not help with her diarrhea then she can start taking Imodium as needed.  She we will also have her stool sent for fecal calprotectin.  The patient has been explained the plan and agrees with it.    Midge Minium, MD. Clementeen Graham    Note: This dictation was prepared with Dragon dictation along with smaller phrase technology. Any transcriptional errors that result from this process are unintentional.

## 2023-04-11 LAB — CALPROTECTIN, FECAL: Calprotectin, Fecal: 17 ug/g (ref 0–120)

## 2023-04-27 ENCOUNTER — Telehealth: Payer: Self-pay

## 2023-04-27 NOTE — Telephone Encounter (Signed)
Pt would like to know test results from stool test.

## 2023-05-04 ENCOUNTER — Other Ambulatory Visit: Payer: Self-pay | Admitting: Pulmonary Disease

## 2023-05-04 DIAGNOSIS — R911 Solitary pulmonary nodule: Secondary | ICD-10-CM

## 2023-06-19 ENCOUNTER — Other Ambulatory Visit: Payer: Self-pay

## 2023-06-19 ENCOUNTER — Telehealth: Payer: Self-pay

## 2023-06-19 ENCOUNTER — Encounter
Admission: RE | Admit: 2023-06-19 | Discharge: 2023-06-19 | Disposition: A | Discharge status: Home / Self Care | Payer: Medicare Other | Source: Ambulatory Visit | Attending: Pulmonary Disease | Admitting: Pulmonary Disease

## 2023-06-19 VITALS — Ht 62.0 in | Wt 168.0 lb

## 2023-06-19 DIAGNOSIS — Z01812 Encounter for preprocedural laboratory examination: Secondary | ICD-10-CM

## 2023-06-19 HISTORY — DX: Venous insufficiency (chronic) (peripheral): I87.2

## 2023-06-19 HISTORY — DX: Solitary pulmonary nodule: R91.1

## 2023-06-19 HISTORY — DX: Mixed hyperlipidemia: E78.2

## 2023-06-19 HISTORY — DX: Rheumatoid arthritis, unspecified: M06.9

## 2023-06-19 HISTORY — DX: Restless legs syndrome: G25.81

## 2023-06-19 HISTORY — DX: Pneumonia, unspecified organism: J18.9

## 2023-06-19 HISTORY — DX: Thyrotoxicosis, unspecified without thyrotoxic crisis or storm: E05.90

## 2023-06-19 HISTORY — DX: Macular cyst, hole, or pseudohole, right eye: H35.341

## 2023-06-19 HISTORY — DX: Hypokalemia: E87.6

## 2023-06-19 HISTORY — DX: Colles' fracture of unspecified radius, initial encounter for closed fracture: S52.539A

## 2023-06-19 HISTORY — DX: Bilateral primary osteoarthritis of knee: M17.0

## 2023-06-19 HISTORY — DX: Age-related osteoporosis without current pathological fracture: M81.0

## 2023-06-19 HISTORY — DX: Iron deficiency anemia, unspecified: D50.9

## 2023-06-19 HISTORY — DX: Type 2 diabetes mellitus with diabetic neuropathy, unspecified: E11.40

## 2023-06-19 HISTORY — DX: Deficiency of other specified B group vitamins: E53.8

## 2023-06-19 NOTE — Telephone Encounter (Signed)
This encounter was created in error - please disregard.

## 2023-06-19 NOTE — Telephone Encounter (Signed)
   Pre-operative Risk Assessment    Patient Name: MEKA LEWAN  DOB: 1951/02/27 MRN: [id number]     Request for Surgical Clearance    Procedure:  ROBOTIC ASSISTED NAVIGATIONAL BRONCHOSCOPY   Date of Surgery:  Clearance 06/26/23                                 Surgeon:  Dr. Vida Rigger  Surgeon's Group or Practice Name:  [facility]  Phone number:  [phone]  Fax number:  [phone]    Type of Clearance Requested:   - Medical    Type of Anesthesia:  General    Additional requests/questions:    Scarlette Shorts   06/19/2023, 3:17 PM

## 2023-06-19 NOTE — Telephone Encounter (Signed)
Patient scheduled for tele visit in 06/20/23. Med rec and consent done. Ok'd to use slot per preop APP

## 2023-06-19 NOTE — Patient Instructions (Signed)
Your procedure is scheduled [id number], November 11 Report to the Registration Desk on the 1st floor of the [facility] To find out your arrival time, please call [phone] between 1PM - 3PM on: Friday, November 8 If your arrival time is 6:00 am, do not arrive before that time as the [facility] entrance doors do not open until 6:00 am.  REMEMBER: Instructions that are not followed completely may result in serious medical risk, up to and including death; or upon the discretion of your surgeon and anesthesiologist your surgery may need to be rescheduled.  Do not eat food after midnight the night before surgery.  No gum chewing or hard candies.  You may however, drink water up to 2 hours before you are scheduled to arrive for your surgery. Do not drink anything within 2 hours of your scheduled arrival time.  One week prior to surgery: starting November 4 Stop Anti-inflammatories (NSAIDS) such as Advil, Aleve, Ibuprofen, Motrin, Naproxen, Naprosyn and Aspirin based products such as Excedrin, Goody's Powder, BC Powder. Stop ANY OVER THE COUNTER supplements until after surgery. Stop biotin, vitamin D, iron, magnesium, multiple vitamins, probiotic.   You may however, continue to take Tylenol if needed for pain up until the day of surgery.  Continue taking all of your other prescription medications up until the day of surgery.  ON THE DAY OF SURGERY ONLY TAKE THESE MEDICATIONS WITH SIPS OF WATER:  Citalopram (Celexa) Gabapentin Omeprazole (Prilosec) Potassium Prednisone All morning eyes drops as usual  No Alcohol for 24 hours before or after surgery.  No Smoking including e-cigarettes for 24 hours before surgery.  No chewable tobacco products for at least 6 hours before surgery.  No nicotine patches on the day of surgery.  Do not use any "recreational" drugs for at least a week (preferably 2 weeks) before your surgery.  Please be advised that the combination of cocaine and  anesthesia may have negative outcomes, up to and including death. If you test positive for cocaine, your surgery will be cancelled.  On the morning of surgery brush your teeth with toothpaste and water, you may rinse your mouth with mouthwash if you wish. Do not swallow any toothpaste or mouthwash.  Do not wear jewelry, make-up, hairpins, clips or nail polish.  For welded (permanent) jewelry: bracelets, anklets, waist bands, etc.  Please have this removed prior to surgery.  If it is not removed, there is a chance that hospital personnel will need to cut it off on the day of surgery.  Do not wear lotions, powders, or perfumes.   Do not shave body hair from the neck down 48 hours before surgery.  Contact lenses, hearing aids and dentures may not be worn into surgery.  Do not bring valuables to the hospital. [facility] is not responsible for any missing/lost belongings or valuables.   Notify your doctor if there is any change in your medical condition (cold, fever, infection).  Wear comfortable clothing (specific to your surgery type) to the hospital.  After surgery, you can help prevent lung complications by doing breathing exercises.  Take deep breaths and cough every 1-2 hours.   If you are being discharged the day of surgery, you will not be allowed to drive home. You will need a responsible individual to drive you home and stay with you for 24 hours after surgery.   If you are taking public transportation, you will need to have a responsible individual with you.  Please call the Pre-admissions Testing  Dept. at [phone] if you have any questions about these instructions.  Surgery Visitation Policy:  Patients having surgery or a procedure may have two visitors.  Children under the age of 64 must have an adult with them who is not the patient.

## 2023-06-19 NOTE — Telephone Encounter (Signed)
   Name: Nicole Bass  DOB: [birth year]  MRN: [id number]  Primary Cardiologist: Julien Nordmann, MD   Preoperative team, please contact this patient and set up a phone call appointment for further preoperative risk assessment. Please obtain consent and complete medication review. Thank you for your help.  Okay to use provider slot at 3:20 tomorrow or any other slots Wednesday, Thursday or Friday.   I confirm that guidance regarding antiplatelet and oral anticoagulation therapy has been completed and, if necessary, noted below.  None requested.   I also confirmed the patient resides in the state of [place]. As per [facility] Board telemedicine laws, the patient must reside in the state in which the provider is licensed.   Carlos Levering, NP 06/19/2023, 3:46 PM [facility]

## 2023-06-19 NOTE — Telephone Encounter (Signed)
  Patient Consent for Virtual Visit         Nicole Bass has provided verbal consent on 06/19/2023 for a virtual visit (video or telephone).   CONSENT FOR VIRTUAL VISIT FOR:  Nicole Bass  By participating in this virtual visit I agree to the following:  I hereby voluntarily request, consent and authorize [facility] and its employed or contracted physicians, physician assistants, nurse practitioners or other licensed health care professionals (the Practitioner), to provide me with telemedicine health care services (the "Services") as deemed necessary by the treating Practitioner. I acknowledge and consent to receive the Services by the Practitioner via telemedicine. I understand that the telemedicine visit will involve communicating with the Practitioner through live audiovisual communication technology and the disclosure of certain medical information by electronic transmission. I acknowledge that I have been given the opportunity to request an in-person assessment or other available alternative prior to the telemedicine visit and am voluntarily participating in the telemedicine visit.  I understand that I have the right to withhold or withdraw my consent to the use of telemedicine in the course of my care at any time, without affecting my right to future care or treatment, and that the Practitioner or I may terminate the telemedicine visit at any time. I understand that I have the right to inspect all information obtained and/or recorded in the course of the telemedicine visit and may receive copies of available information for a reasonable fee.  I understand that some of the potential risks of receiving the Services via telemedicine include:  Delay or interruption in medical evaluation due to technological equipment failure or disruption; Information transmitted may not be sufficient (e.g. poor resolution of images) to allow for appropriate medical decision making by the  Practitioner; and/or  In rare instances, security protocols could fail, causing a breach of personal health information.  Furthermore, I acknowledge that it is my responsibility to provide information about my medical history, conditions and care that is complete and accurate to the best of my ability. I acknowledge that Practitioner's advice, recommendations, and/or decision may be based on factors not within their control, such as incomplete or inaccurate data provided by me or distortions of diagnostic images or specimens that may result from electronic transmissions. I understand that the practice of medicine is not an exact science and that Practitioner makes no warranties or guarantees regarding treatment outcomes. I acknowledge that a copy of this consent can be made available to me via my patient portal [facility] MyChart), or I can request a printed copy by calling the office of [facility].    I understand that my insurance will be billed for this visit.   I have read or had this consent read to me. I understand the contents of this consent, which adequately explains the benefits and risks of the Services being provided via telemedicine.  I have been provided ample opportunity to ask questions regarding this consent and the Services and have had my questions answered to my satisfaction. I give my informed consent for the services to be provided through the use of telemedicine in my medical care

## 2023-06-20 ENCOUNTER — Ambulatory Visit: Payer: Medicare Other | Attending: Cardiovascular Disease | Admitting: Student

## 2023-06-20 DIAGNOSIS — Z01810 Encounter for preprocedural cardiovascular examination: Secondary | ICD-10-CM

## 2023-06-20 NOTE — Progress Notes (Signed)
Virtual Visit via Telephone Note   Because of Nicole Bass's co-morbid illnesses, she is at least at moderate risk for complications without adequate follow up.  This format is felt to be most appropriate for this patient at this time.  The patient did not have access to video technology/had technical difficulties with video requiring transitioning to audio format only (telephone).  All issues noted in this document were discussed and addressed.  No physical exam could be performed with this format.  Please refer to the patient's chart for her consent to telehealth for [facility].  Evaluation Performed:  Preoperative cardiovascular risk assessment _____________   Date:  06/20/2023   Patient ID:  Nicole Bass, DOB [birth year], MRN [id number] Patient Location:  Home Provider location:   Office  Primary Care Provider:  Marguarite Arbour, MD Primary Cardiologist:  Julien Nordmann, MD  Chief Complaint / Patient Profile   72 y.o. y/o female with a h/o nonobstructive CAD per angiography, chronic diastolic heart failure, hypertension, hyperlipidemia, OSA, GERD, Crohn's, T2DM, hypothyroidism, RA who is pending robotic assisted navigational bronchoscopy by Dr. Karna Christmas on 06/26/2023 and presents today for telephonic preoperative cardiovascular risk assessment.  History of Present Illness    Nicole Bass is a 72 y.o. female who presents via audio/video conferencing for a telehealth visit today.  Pt was last seen in cardiology clinic on 12/16/2022 by Eula Listen, PA-C.  At that time MANE CONSOLO had recently undergone hospital admission for dizziness, weakness, left rib pain.  Upon follow-up she complained of a 1 year history of exertional dyspnea and fatigue.  She underwent R/LHC which showed nonobstructive CAD and normal heart pressures.  The patient is now pending procedure as outlined above. Since her last visit, she is doing well. Patient denies lower extremity edema,  orthopnea or PND. She reports she does not necessarily feel short of breath but feels she breathes heavily which is why she is having the bronchoscopy. No chest pain, pressure, or tightness. No palpitations.  She does not do formal exercise. She stays active walking around [place] and other stores. She is able to perform light to moderate household activities without difficulty.   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   History of chicken pox    History of claustrophobia    History of kidney stones    History of shingles    HOH (hard of hearing)    left ear   Hyperthyroidism    Hypokalemia    Immune deficiency disorder (HCC)    Iron deficiency anemia    Lung nodule    Lymphatic disorder    Macular hole of right eye    Meniere's disease    Mixed hyperlipidemia  Motion sickness    Nasal fracture 03/27/2020   due to fall   Neuromuscular disorder (HCC)    Neuropathy    legs   OSA (obstructive sleep apnea) 05/11/2022   Osteoarthritis of both knees    Osteoporosis    Peripheral neuropathy    Pneumonia    Pyelonephritis    Restless leg syndrome    Rheumatoid arthritis (HCC)    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HCC)    Type 2 diabetes mellitus with diabetic neuropathy (HCC)    diet controlled   UTI (urinary tract infection)    HO   Venous insufficiency    Vertigo    can be accompanied by nausea   Vitamin B 12 deficiency     Wears dentures    upper dentures   Past Surgical History:  Procedure Laterality Date   ABDOMINAL HYSTERECTOMY  2005   APPENDECTOMY     BACK SURGERY     x 3   CATARACT EXTRACTION W/ INTRAOCULAR LENS IMPLANT Right 07/06/2021   CATARACT EXTRACTION W/ INTRAOCULAR LENS IMPLANT Left 07/27/2021   CERVICAL SPINE SURGERY     metal plate   CESAREAN SECTION  1976 & 1989   X 2   CHOLECYSTECTOMY     CLOSED REDUCTION NASAL FRACTURE N/A 04/09/2020   Procedure: CLOSED REDUCTION NASAL FRACTURE;  Surgeon: Vernie Murders, MD;  Location: [facility];  Service: ENT;  Laterality: N/A;   COLON SURGERY  1999   removed 18 inches of colon,removed appendix   COLONOSCOPY     1998, 2001, 2004, 2008, 2009, 2010, 2014, 2018   COLONOSCOPY WITH PROPOFOL N/A 08/02/2021   Procedure: COLONOSCOPY WITH PROPOFOL;  Surgeon: Regis Bill, MD;  Location: [facility];  Service: Endoscopy;  Laterality: N/A;   dental implants     3 teeth/ wears dentures   DILATION AND CURETTAGE OF UTERUS  2004   ELBOW FRACTURE SURGERY Left 2008   ESOPHAGOGASTRODUODENOSCOPY     1998, 2001, 2004, 2008, 2009, 2010, 2014   FIBEROPTIC BRONCHOSCOPY N/A 06/01/2020   Procedure: BEDSIDE BRONCHOSCOPY FIBEROPTIC;  Surgeon: Vida Rigger, MD;  Location: [facility] OR[facility];  Service: Thoracic;  Laterality: N/A;   FRACTURE SURGERY     HALLUX VALGUS CORRECTION Right 12/27/2012   JOINT REPLACEMENT Right 2015   partial knee replacement   KYPHOPLASTY     KYPHOPLASTY N/A 04/14/2015   Procedure: KYPHOPLASTY;  Surgeon: Kennedy Bucker, MD;  Location: [facility];  Service: Orthopedics;  Laterality: N/A;   KYPHOPLASTY N/A 06/04/2015   Procedure: KYPHOPLASTY L-5;  Surgeon: Kennedy Bucker, MD;  Location: [facility];  Service: Orthopedics;  Laterality: N/A;   KYPHOPLASTY     LUMBAR LAMINECTOMY FOR EPIDURAL ABSCESS N/A 10/12/2020   Procedure: SYNOVIAL CYST RESECTION;  Surgeon: Lucy Chris, MD;  Location: [facility];  Service: Neurosurgery;  Laterality: N/A;    MAXIMUM ACCESS (MAS) TRANSFORAMINAL LUMBAR INTERBODY FUSION (TLIF) 1 LEVEL Left 10/12/2020   Procedure: OPEN L5/S1 TRANSFORAMINAL LUMBAR INTERBODY FUSION (TLIF) 1 LEVEL;  Surgeon: Lucy Chris, MD;  Location: [facility];  Service: Neurosurgery;  Laterality: Left;   MEDIAL PARTIAL KNEE REPLACEMENT Right 2015   NECK/PLATE  1610   OOPHORECTOMY Bilateral    ORIF NASAL FRACTURE N/A 04/09/2020   Procedure: OPEN REDUCTION INTERNAL FIXATION (ORIF) NASAL FRACTURE;  Surgeon: Vernie Murders, MD;  Location: [facility];  Service: ENT;  Laterality: N/A;   PARS PLANA VITRECTOMY Right 09/08/2022   REPAIR EXTENSOR TENDON Left 01/08/2019   Procedure: Realignment  EXTENSOR TENDON;  Surgeon: Kennedy Bucker,  MD;  Location: [facility];  Service: Orthopedics;  Laterality: Left;   REVERSE SHOULDER ARTHROPLASTY Right 04/18/2019   Procedure: REVERSE SHOULDER ARTHROPLASTY;  Surgeon: Christena Flake, MD;  Location: [facility];  Service: Orthopedics;  Laterality: Right;   RIGHT/LEFT HEART CATH AND CORONARY ANGIOGRAPHY Bilateral 12/27/2022   Procedure: RIGHT/LEFT HEART CATH AND CORONARY ANGIOGRAPHY;  Surgeon: Yvonne Kendall, MD;  Location: [facility];  Service: Cardiovascular;  Laterality: Bilateral;   SHOULDER ARTHROSCOPY WITH ROTATOR CUFF REPAIR AND SUBACROMIAL DECOMPRESSION Right 04/13/2016   Procedure: SHOULDER ARTHROSCOPY WITH ROTATOR CUFF REPAIR AND SUBACROMIAL DECOMPRESSION, release of long head biceps tendon;  Surgeon: Erin Sons, MD;  Location: [facility];  Service: Orthopedics;  Laterality: Right;   TOE SURGERY Right 2013   first MTP osteotomy   TONSILLECTOMY  1995   UPPER GI ENDOSCOPY     WRIST FRACTURE SURGERY Bilateral 2016    Allergies  Allergies  Allergen Reactions   Amoxicillin-Pot Clavulanate Diarrhea   Ciprofloxacin Itching and Rash   Levofloxacin Hives and Rash   Methotrexate     Other reaction(s): Other (See Comments) Mouth Ulcers/[place] Mouth and throat ulcers   Methotrexate  Derivatives     Mouth and throat ulcers   Sulfa Antibiotics Other (See Comments)    Per pts MD she is unable to take this medication because it is contraindicated with her other medications.  NOTE: Pt has never had reaction to these or sulfasalazine    Per pts MD she is unable to take this medication because it is contraindicated with her other medications.    Per pts MD she is unable to take this medication because it is contraindicated with her other medications.    Other reaction(s): Other (See Comments), Unknown  Per pts MD she is unable to take this medication because it is contraindicated with her other medications.   Rosuvastatin Swelling and Other (See Comments)    And cramps   Trelegy Ellipta [Fluticasone-Umeclidin-Vilant]     Laryngitis    Cefprozil Rash   Clindamycin/Lincomycin Rash   Doxycycline Rash    Pt tolerates Z pack and amoxicillin   Enbrel [Etanercept] Itching and Rash   Humira [Adalimumab] Itching and Rash   Hydrocodone-Acetaminophen Itching and Other (See Comments)    Itching only   Lorabid [Loracarbef] Itching and Rash   Meropenem Itching and Rash   Oxycodone Itching, Other (See Comments) and Rash    Other reaction(s): Other (See Comments)   Oxycodone-Acetaminophen Itching   Primidone Itching and Rash    Home Medications    Prior to Admission medications   Medication Sig Start Date End Date Taking? Authorizing Provider  acetaminophen (TYLENOL) 650 MG CR tablet Take 1,300 mg by mouth every 8 (eight) hours as needed.    [provider]  alendronate (FOSAMAX) 70 MG tablet Take 70 mg by mouth every Monday. Take with a full glass of water on an empty stomach.    [provider]  atropine 1 % ophthalmic solution Place 1 drop into the right eye 3 (three) times daily. 09/08/22   [provider]  Biotin 5000 MCG CHEW Chew 1 tablet by mouth daily.    [provider]  cetirizine (ZYRTEC) 10 MG tablet Take 10 mg by mouth daily.     [provider]  chlorhexidine (PERIDEX) 0.12 % solution Use as directed 15 mLs in the mouth or throat as needed (mouth sores).    [provider]  cholecalciferol (VITAMIN D3) 25 MCG (1000 UT)  tablet Take 1,000 Units by mouth daily.    [provider]  citalopram (CELEXA) 10 MG tablet Take 1 tablet by mouth daily. 07/20/22 07/20/23  [provider]  cyanocobalamin (,VITAMIN B-12,) 1000 MCG/ML injection Inject 1,000 mcg into the muscle every 30 (thirty) days.    [provider]  dorzolamide (TRUSOPT) 2 % ophthalmic solution Place 1 drop into the right eye 3 (three) times daily. 11/23/22 11/23/23  [provider]  ferrous sulfate 324 (65 Fe) MG TBEC Take 1 tablet by mouth daily.    [provider]  gabapentin (NEURONTIN) 100 MG capsule Take 100 mg by mouth every morning. 08/20/19   [provider]  gabapentin (NEURONTIN) 400 MG capsule Take 400 mg by mouth at bedtime.    [provider]  magnesium oxide (MAG-OX) 400 MG tablet Take 400 mg by mouth daily.    [provider]  Multiple Vitamin (MULTIVITAMIN WITH MINERALS) TABS tablet Take 1 tablet by mouth daily.    [provider]  nystatin cream (MYCOSTATIN) Apply 1 Application topically as needed. Under breasts 07/20/22 07/20/23  [provider]  omeprazole (PRILOSEC) 40 MG capsule Take 40 mg by mouth daily before breakfast.    [provider]  potassium chloride SA (KLOR-CON M) 20 MEQ tablet Take 20 mEq by mouth 3 (three) times a week. M-W-F    [provider]  prednisoLONE acetate (PRED FORTE) 1 % ophthalmic suspension Place 1 drop into the right eye 3 (three) times daily. 11/23/22   [provider]  predniSONE (DELTASONE) 1 MG tablet Take 3 mg by mouth daily with breakfast.     [provider]  predniSONE (DELTASONE) 10 MG tablet Take 10 mg by mouth daily with breakfast.  06/20/23  [provider]  Probiotic  Product (PROBIOTIC PO) Take 1 capsule by mouth daily. Culturelle Probiotic    [provider]  Tofacitinib Citrate (XELJANZ) 5 MG TABS Take 1 tablet by mouth 2 (two) times daily.    [provider]  traMADol (ULTRAM) 50 MG tablet Take 50 mg by mouth 2 (two) times daily as needed. 03/18/22   [provider]  valACYclovir (VALTREX) 500 MG tablet Take 1 tablet twice daily for 5 days for flares. Start at first sign of symptoms. 11/25/21   Deirdre Evener, MD    Physical Exam    Vital Signs:  Nicole Bass does not have vital signs available for review today.  Given telephonic nature of communication, physical exam is limited. AAOx3. NAD. Normal affect.  Speech and respirations are unlabored.  Accessory Clinical Findings    None  Assessment & Plan    Primary Cardiologist: Julien Nordmann, MD  Preoperative cardiovascular risk assessment.  Robotic assisted navigational bronchoscopy by Dr. Karna Christmas on 06/26/2023.  Chart reviewed as part of pre-operative protocol coverage. According to the RCRI, patient has a 0.9% risk of MACE. Patient reports activity equivalent to 4.0 METS (performs light to moderate household chores, walks around [place] and the grocery store).   Given past medical history and time since last visit, based on ACC/AHA guidelines, GERALDYNE BARRACLOUGH would be at acceptable risk for the planned procedure without further cardiovascular testing.   Patient was advised that if she develops new symptoms prior to surgery to contact our office to arrange a follow-up appointment.  she verbalized understanding.  I will route this recommendation to the requesting party via Epic fax function.  Please call with questions.  Time:   Today,  I have spent 6 minutes with the patient with telehealth technology discussing medical history, symptoms, and management plan.     Carlos Levering, NP  06/20/2023, 8:29 AM

## 2023-06-22 ENCOUNTER — Ambulatory Visit
Admission: RE | Admit: 2023-06-22 | Discharge: 2023-06-22 | Disposition: A | Discharge status: Home / Self Care | Payer: Medicare Other | Source: Ambulatory Visit | Attending: Pulmonary Disease | Admitting: Pulmonary Disease

## 2023-06-22 ENCOUNTER — Other Ambulatory Visit: Payer: Medicare Other

## 2023-06-22 DIAGNOSIS — R911 Solitary pulmonary nodule: Secondary | ICD-10-CM | POA: Insufficient documentation

## 2023-06-23 ENCOUNTER — Encounter: Payer: Self-pay | Admitting: Urgent Care

## 2023-06-25 MED ORDER — ORAL CARE MOUTH RINSE: 15.0000 mL | Freq: Once | OROMUCOSAL | Status: AC

## 2023-06-25 MED ORDER — SODIUM CHLORIDE 0.9 % IV SOLN: INTRAVENOUS | Status: DC

## 2023-06-25 MED ORDER — CHLORHEXIDINE GLUCONATE 0.12 % MT SOLN
15.0000 mL | Freq: Once | OROMUCOSAL | Status: AC
  Administered 2023-06-26: 15 mL via OROMUCOSAL

## 2023-06-26 ENCOUNTER — Ambulatory Visit
Admission: RE | Admit: 2023-06-26 | Discharge: 2023-06-26 | Disposition: A | Discharge status: Home / Self Care | Payer: Medicare Other | Attending: Pulmonary Disease | Admitting: Pulmonary Disease

## 2023-06-26 ENCOUNTER — Ambulatory Visit: Payer: Medicare Other

## 2023-06-26 ENCOUNTER — Ambulatory Visit: Payer: Medicare Other | Admitting: Urgent Care

## 2023-06-26 ENCOUNTER — Other Ambulatory Visit: Payer: Self-pay

## 2023-06-26 ENCOUNTER — Encounter
Admission: RE | Disposition: A | Discharge status: Home / Self Care | Payer: Self-pay | Source: Home / Self Care | Attending: Pulmonary Disease

## 2023-06-26 DIAGNOSIS — Z7984 Long term (current) use of oral hypoglycemic drugs: Secondary | ICD-10-CM | POA: Insufficient documentation

## 2023-06-26 DIAGNOSIS — I10 Essential (primary) hypertension: Secondary | ICD-10-CM | POA: Diagnosis not present

## 2023-06-26 DIAGNOSIS — K760 Fatty (change of) liver, not elsewhere classified: Secondary | ICD-10-CM | POA: Diagnosis not present

## 2023-06-26 DIAGNOSIS — R911 Solitary pulmonary nodule: Secondary | ICD-10-CM | POA: Diagnosis present

## 2023-06-26 DIAGNOSIS — Z01812 Encounter for preprocedural laboratory examination: Secondary | ICD-10-CM

## 2023-06-26 DIAGNOSIS — M0510 Rheumatoid lung disease with rheumatoid arthritis of unspecified site: Secondary | ICD-10-CM | POA: Diagnosis not present

## 2023-06-26 DIAGNOSIS — G4733 Obstructive sleep apnea (adult) (pediatric): Secondary | ICD-10-CM | POA: Diagnosis not present

## 2023-06-26 DIAGNOSIS — I2510 Atherosclerotic heart disease of native coronary artery without angina pectoris: Secondary | ICD-10-CM | POA: Insufficient documentation

## 2023-06-26 DIAGNOSIS — K219 Gastro-esophageal reflux disease without esophagitis: Secondary | ICD-10-CM | POA: Insufficient documentation

## 2023-06-26 DIAGNOSIS — E1142 Type 2 diabetes mellitus with diabetic polyneuropathy: Secondary | ICD-10-CM | POA: Insufficient documentation

## 2023-06-26 HISTORY — DX: Unspecified sensorineural hearing loss: H90.5

## 2023-06-26 HISTORY — DX: Other forms of dyspnea: R06.09

## 2023-06-26 HISTORY — DX: Unspecified mononeuropathy of bilateral lower limbs: G57.93

## 2023-06-26 HISTORY — DX: Iron deficiency anemia, unspecified: D50.9

## 2023-06-26 HISTORY — DX: Essential (primary) hypertension: I10

## 2023-06-26 HISTORY — DX: Obstructive sleep apnea (adult) (pediatric): G47.33

## 2023-06-26 HISTORY — DX: Type 2 diabetes mellitus without complications: E11.9

## 2023-06-26 HISTORY — DX: Other long term (current) drug therapy: Z79.899

## 2023-06-26 HISTORY — DX: Long term (current) use of unspecified immunomodulators and immunosuppressants: Z79.60

## 2023-06-26 HISTORY — DX: Atherosclerotic heart disease of native coronary artery without angina pectoris: I25.10

## 2023-06-26 HISTORY — DX: Rheumatoid arthritis, unspecified: M06.9

## 2023-06-26 HISTORY — DX: Long term (current) use of systemic steroids: Z79.52

## 2023-06-26 LAB — KOH PREP

## 2023-06-26 SURGERY — BRONCHOSCOPY, WITH BIOPSY USING ELECTROMAGNETIC NAVIGATION
Anesthesia: General

## 2023-06-26 MED ORDER — PHENYLEPHRINE 80 MCG/ML (10ML) SYRINGE FOR IV PUSH (FOR BLOOD PRESSURE SUPPORT)
PREFILLED_SYRINGE | INTRAVENOUS | Status: AC
  Filled 2023-06-26: qty 10

## 2023-06-26 MED ORDER — SUGAMMADEX SODIUM 200 MG/2ML IV SOLN
INTRAVENOUS | Status: DC | PRN
  Administered 2023-06-26: 200 mg via INTRAVENOUS

## 2023-06-26 MED ORDER — PHENYLEPHRINE HCL-NACL 20-0.9 MG/250ML-% IV SOLN
INTRAVENOUS | Status: AC
  Filled 2023-06-26: qty 250

## 2023-06-26 MED ORDER — LIDOCAINE HCL (CARDIAC) PF 100 MG/5ML IV SOSY
PREFILLED_SYRINGE | INTRAVENOUS | Status: DC | PRN
  Administered 2023-06-26: 100 mg via INTRAVENOUS

## 2023-06-26 MED ORDER — PROPOFOL 10 MG/ML IV BOLUS
INTRAVENOUS | Status: DC | PRN
  Administered 2023-06-26: 150 mg via INTRAVENOUS

## 2023-06-26 MED ORDER — ROCURONIUM BROMIDE 10 MG/ML (PF) SYRINGE
PREFILLED_SYRINGE | INTRAVENOUS | Status: AC
  Filled 2023-06-26: qty 10

## 2023-06-26 MED ORDER — FENTANYL CITRATE (PF) 100 MCG/2ML IJ SOLN
INTRAMUSCULAR | Status: AC
  Filled 2023-06-26: qty 2

## 2023-06-26 MED ORDER — CHLORHEXIDINE GLUCONATE 0.12 % MT SOLN
OROMUCOSAL | Status: AC
  Filled 2023-06-26: qty 15

## 2023-06-26 MED ORDER — DEXAMETHASONE SODIUM PHOSPHATE 10 MG/ML IJ SOLN
INTRAMUSCULAR | Status: DC | PRN
  Administered 2023-06-26: 5 mg via INTRAVENOUS

## 2023-06-26 MED ORDER — PHENYLEPHRINE HCL-NACL 20-0.9 MG/250ML-% IV SOLN
INTRAVENOUS | Status: DC | PRN
  Administered 2023-06-26: 20 ug/min via INTRAVENOUS

## 2023-06-26 MED ORDER — DEXAMETHASONE SODIUM PHOSPHATE 10 MG/ML IJ SOLN
INTRAMUSCULAR | Status: AC
  Filled 2023-06-26: qty 1

## 2023-06-26 MED ORDER — PROPOFOL 10 MG/ML IV BOLUS
INTRAVENOUS | Status: AC
Start: 2023-06-26 — End: ?
  Filled 2023-06-26: qty 20

## 2023-06-26 MED ORDER — LIDOCAINE HCL (PF) 2 % IJ SOLN
INTRAMUSCULAR | Status: AC
Start: 2023-06-26 — End: ?
  Filled 2023-06-26: qty 5

## 2023-06-26 MED ORDER — PHENYLEPHRINE 80 MCG/ML (10ML) SYRINGE FOR IV PUSH (FOR BLOOD PRESSURE SUPPORT)
PREFILLED_SYRINGE | INTRAVENOUS | Status: DC | PRN
  Administered 2023-06-26 (×3): 160 ug via INTRAVENOUS

## 2023-06-26 MED ORDER — ONDANSETRON HCL 4 MG/2ML IJ SOLN
INTRAMUSCULAR | Status: DC | PRN
  Administered 2023-06-26: 4 mg via INTRAVENOUS

## 2023-06-26 MED ORDER — ROCURONIUM BROMIDE 100 MG/10ML IV SOLN
INTRAVENOUS | Status: DC | PRN
  Administered 2023-06-26: 60 mg via INTRAVENOUS

## 2023-06-26 MED ORDER — FENTANYL CITRATE (PF) 100 MCG/2ML IJ SOLN: 25.0000 ug | INTRAMUSCULAR | Status: DC | PRN

## 2023-06-26 MED ORDER — FENTANYL CITRATE (PF) 100 MCG/2ML IJ SOLN
INTRAMUSCULAR | Status: DC | PRN
  Administered 2023-06-26 (×2): 25 ug via INTRAVENOUS
  Administered 2023-06-26: 50 ug via INTRAVENOUS

## 2023-06-26 MED ORDER — ONDANSETRON HCL 4 MG/2ML IJ SOLN
INTRAMUSCULAR | Status: AC
  Filled 2023-06-26: qty 2

## 2023-06-26 NOTE — Transfer of Care (Signed)
Immediate Anesthesia Transfer of Care Note  Patient: Nicole Bass  Procedure(s) Performed: ROBOTIC ASSISTED NAVIGATIONAL BRONCHOSCOPY  Patient Location: PACU  Anesthesia Type:General  Level of Consciousness: drowsy and patient cooperative  Airway & Oxygen Therapy: Patient Spontanous Breathing and Patient connected to nasal cannula oxygen  Post-op Assessment: Report given to RN and Post -op Vital signs reviewed and stable  Post vital signs: Reviewed and stable  Last Vitals:  Vitals Value Taken Time  BP 136/62 06/26/23 1351  Temp 36.2 C 06/26/23 1351  Pulse 85 06/26/23 1358  Resp 17 06/26/23 1358  SpO2 96 % 06/26/23 1358  Vitals shown include unfiled device data.  Last Pain:  Vitals:   06/26/23 1351  TempSrc:   PainSc: 0-No pain         Complications: No notable events documented.

## 2023-06-26 NOTE — Anesthesia Postprocedure Evaluation (Signed)
Anesthesia Post Note  Patient: KARISHA CHAUDHARI  Procedure(s) Performed: ROBOTIC ASSISTED NAVIGATIONAL BRONCHOSCOPY  Patient location during evaluation: PACU Anesthesia Type: General Level of consciousness: awake and alert Pain management: pain level controlled Vital Signs Assessment: post-procedure vital signs reviewed and stable Respiratory status: spontaneous breathing, nonlabored ventilation, respiratory function stable and patient connected to nasal cannula oxygen Cardiovascular status: blood pressure returned to baseline and stable Postop Assessment: no apparent nausea or vomiting Anesthetic complications: no  No notable events documented.   Last Vitals:  Vitals:   06/26/23 1105 06/26/23 1351  BP: (!) 141/85 136/62  Pulse: 91 94  Resp: 16 18  Temp: 36.7 C (!) 36.2 C  SpO2: 98% 93%    Last Pain:  Vitals:   06/26/23 1351  TempSrc:   PainSc: 0-No pain                 Stephanie Coup

## 2023-06-26 NOTE — H&P (Signed)
PULMONOLOGY         Date: 06/26/2023,   MRN# [id number] Nicole Bass 1951/06/16     AdmissionWeight: 76.2 kg                 CurrentWeight: 76.2 kg  Referring provider: Dr Allena Katz   CHIEF COMPLAINT:   Lung nodules with hilar adenopathy   HISTORY OF PRESENT ILLNESS   This is a pleasant 72 yo F with hx of autoimmune disease including Chrons disease, and Rhematoid who has been on chronic immunosuppresive therapy.  She had lung nodules noted years back and was worked up for this with findings of fungal involvement including cryptococcus and histoplasma.  She was treated for this and is here post interval follow up due to persistence of right lung nodules.  She requires further immunosuppresion and today we plan to have bronchoscopy to find out if she still has active fungal pulmonary infection vs scarring vs atypical findings including neoplasm.    PAST MEDICAL HISTORY   Past Medical History:  Diagnosis Date   Anxiety    CAD (coronary artery disease)    a.) R/LHC 12/27/2022: 15% p-mLAD --> med mgmt; mRA 6, mPA 19, mPCWP 10, AO sat 97%, PA sat 68%, CO 6.5, CI 3.7   Chronic dyspnea    Crohn's disease (HCC)    DDD (degenerative disc disease), cervical 2003   DDD (degenerative disc disease), lumbar    Depression    Diastolic dysfunction 05/27/2022   a.) TTE 05/27/2022: EF 60-65%, no RWMAs, G1DD, norm RVSF, no valvulopathies   Diet-controlled type 2 diabetes mellitus with peripheral neuropathy    Fatty liver    GERD (gastroesophageal reflux disease)    Hand weakness    left hand worse, small tremors   Histoplasmosis 2022   History of chicken pox    History of claustrophobia    History of kidney stones    History of shingles    HTN (hypertension)    Hyperthyroidism    IDA (iron deficiency anemia)    Iron deficiency anemia    Left-sided sensorineural hearing loss    Long term current use of immunosuppressive drug    a.) tofacitinib for RA + prednisone   Long  term current use of systemic steroids    a.) prednisone for RA   Lung nodule    Lymphatic disorder    Macular hole of right eye    Meniere's disease    Mixed hyperlipidemia    Motion sickness    Nasal fracture 03/27/2020   due to fall   Neuropathy involving both lower extremities    OSA on CPAP    Osteoarthritis of both knees    Osteoporosis    Peripheral neuropathy    Pneumonia    Pyelonephritis    RA (rheumatoid arthritis) (HCC)    a.) Tx'd with tofacitinib + prednisone   Restless leg syndrome    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HCC)    Venous insufficiency    a.) s/p superficial vein ablation 10/08/2021   Vitamin B 12 deficiency    Wears dentures    upper dentures     SURGICAL HISTORY   Past Surgical History:  Procedure Laterality Date   ABDOMINAL HYSTERECTOMY  2005   APPENDECTOMY     BACK SURGERY     x 3   CATARACT EXTRACTION W/ INTRAOCULAR LENS IMPLANT Right 07/06/2021   CATARACT EXTRACTION W/ INTRAOCULAR LENS IMPLANT Left 07/27/2021   CERVICAL SPINE SURGERY  metal plate   CESAREAN SECTION  1976 & 1989   X 2   CHOLECYSTECTOMY     CLOSED REDUCTION NASAL FRACTURE N/A 04/09/2020   Procedure: CLOSED REDUCTION NASAL FRACTURE;  Surgeon: Vernie Murders, MD;  Location: [facility];  Service: ENT;  Laterality: N/A;   COLON SURGERY  1999   removed 18 inches of colon,removed appendix   COLONOSCOPY     1998, 2001, 2004, 2008, 2009, 2010, 2014, 2018   COLONOSCOPY WITH PROPOFOL N/A 08/02/2021   Procedure: COLONOSCOPY WITH PROPOFOL;  Surgeon: Regis Bill, MD;  Location: [facility];  Service: Endoscopy;  Laterality: N/A;   dental implants     3 teeth/ wears dentures   DILATION AND CURETTAGE OF UTERUS  2004   ELBOW FRACTURE SURGERY Left 2008   ESOPHAGOGASTRODUODENOSCOPY     1998, 2001, 2004, 2008, 2009, 2010, 2014   FIBEROPTIC BRONCHOSCOPY N/A 06/01/2020   Procedure: BEDSIDE BRONCHOSCOPY FIBEROPTIC;  Surgeon: Vida Rigger, MD;  Location: [facility];   Service: Thoracic;  Laterality: N/A;   FRACTURE SURGERY     HALLUX VALGUS CORRECTION Right 12/27/2012   JOINT REPLACEMENT Right 2015   partial knee replacement   KYPHOPLASTY     KYPHOPLASTY N/A 04/14/2015   Procedure: KYPHOPLASTY;  Surgeon: Kennedy Bucker, MD;  Location: [facility];  Service: Orthopedics;  Laterality: N/A;   KYPHOPLASTY N/A 06/04/2015   Procedure: KYPHOPLASTY L-5;  Surgeon: Kennedy Bucker, MD;  Location: [facility];  Service: Orthopedics;  Laterality: N/A;   KYPHOPLASTY     LUMBAR LAMINECTOMY FOR EPIDURAL ABSCESS N/A 10/12/2020   Procedure: SYNOVIAL CYST RESECTION;  Surgeon: Lucy Chris, MD;  Location: [facility];  Service: Neurosurgery;  Laterality: N/A;   MAXIMUM ACCESS (MAS) TRANSFORAMINAL LUMBAR INTERBODY FUSION (TLIF) 1 LEVEL Left 10/12/2020   Procedure: OPEN L5/S1 TRANSFORAMINAL LUMBAR INTERBODY FUSION (TLIF) 1 LEVEL;  Surgeon: Lucy Chris, MD;  Location: [facility];  Service: Neurosurgery;  Laterality: Left;   MEDIAL PARTIAL KNEE REPLACEMENT Right 2015   NECK/PLATE  3664   OOPHORECTOMY Bilateral    ORIF NASAL FRACTURE N/A 04/09/2020   Procedure: OPEN REDUCTION INTERNAL FIXATION (ORIF) NASAL FRACTURE;  Surgeon: Vernie Murders, MD;  Location: [facility];  Service: ENT;  Laterality: N/A;   PARS PLANA VITRECTOMY Right 09/08/2022   REPAIR EXTENSOR TENDON Left 01/08/2019   Procedure: Realignment  EXTENSOR TENDON;  Surgeon: Kennedy Bucker, MD;  Location: [facility];  Service: Orthopedics;  Laterality: Left;   REVERSE SHOULDER ARTHROPLASTY Right 04/18/2019   Procedure: REVERSE SHOULDER ARTHROPLASTY;  Surgeon: Christena Flake, MD;  Location: [facility];  Service: Orthopedics;  Laterality: Right;   RIGHT/LEFT HEART CATH AND CORONARY ANGIOGRAPHY Bilateral 12/27/2022   Procedure: RIGHT/LEFT HEART CATH AND CORONARY ANGIOGRAPHY;  Surgeon: Yvonne Kendall, MD;  Location: [facility];  Service: Cardiovascular;  Laterality: Bilateral;   SHOULDER ARTHROSCOPY WITH ROTATOR CUFF REPAIR  AND SUBACROMIAL DECOMPRESSION Right 04/13/2016   Procedure: SHOULDER ARTHROSCOPY WITH ROTATOR CUFF REPAIR AND SUBACROMIAL DECOMPRESSION, release of long head biceps tendon;  Surgeon: Erin Sons, MD;  Location: [facility];  Service: Orthopedics;  Laterality: Right;   TOE SURGERY Right 2013   first MTP osteotomy   TONSILLECTOMY  1995   UPPER GI ENDOSCOPY     WRIST FRACTURE SURGERY Bilateral 2016     FAMILY HISTORY   Family History  Problem Relation Age of Onset   Diabetes Mother    Emphysema Mother    Emphysema Father    Colon cancer Maternal Grandfather  Breast cancer Neg Hx    Ovarian cancer Neg Hx      SOCIAL HISTORY   Social History   Tobacco Use   Smoking status: Never   Smokeless tobacco: Never  Vaping Use   Vaping status: Never Used  Substance Use Topics   Alcohol use: No   Drug use: No     MEDICATIONS    Home Medication:    Current Medication:  Current Facility-Administered Medications:    0.9 %  sodium chloride infusion, , Intravenous, Continuous, Stephanie Coup, MD    ALLERGIES   Amoxicillin-pot clavulanate, Ciprofloxacin, Levofloxacin, Methotrexate, Methotrexate derivatives, Sulfa antibiotics, Rosuvastatin, Trelegy ellipta [fluticasone-umeclidin-vilant], Cefprozil, Clindamycin/lincomycin, Doxycycline, Enbrel [etanercept], Humira [adalimumab], Hydrocodone-acetaminophen, Lorabid [loracarbef], Meropenem, Oxycodone, Oxycodone-acetaminophen, and Primidone     REVIEW OF SYSTEMS    Review of Systems:  Gen:  Denies  fever, sweats, chills weigh loss  HEENT: Denies blurred vision, double vision, ear pain, eye pain, hearing loss, nose bleeds, sore throat Cardiac:  No dizziness, chest pain or heaviness, chest tightness,edema Resp:   reports dyspnea chronically  Gi: Denies swallowing difficulty, stomach pain, nausea or vomiting, diarrhea, constipation, bowel incontinence Gu:  Denies bladder incontinence, burning urine Ext:   Denies Joint pain,  stiffness or swelling Skin: Denies  skin rash, easy bruising or bleeding or hives Endoc:  Denies polyuria, polydipsia , polyphagia or weight change Psych:   Denies depression, insomnia or hallucinations   Other:  All other systems negative   VS: BP (!) 141/85   Pulse 91   Temp 98 F (36.7 C) (Temporal)   Resp 16   Ht 5\' 2"  (1.575 m)   Wt 76.2 kg   LMP  (LMP Unknown)   SpO2 98%   BMI 30.73 kg/m      PHYSICAL EXAM    GENERAL:NAD, no fevers, chills, no weakness no fatigue HEAD: Normocephalic, atraumatic.  EYES: Pupils equal, round, reactive to light. Extraocular muscles intact. No scleral icterus.  MOUTH: Moist mucosal membrane. Dentition intact. No abscess noted.  EAR, NOSE, THROAT: Clear without exudates. No external lesions.  NECK: Supple. No thyromegaly. No nodules. No JVD.  PULMONARY: decreased breath sounds with mild rhonchi worse at bases bilaterally.  CARDIOVASCULAR: S1 and S2. Regular rate and rhythm. No murmurs, rubs, or gallops. No edema. Pedal pulses 2+ bilaterally.  GASTROINTESTINAL: Soft, nontender, nondistended. No masses. Positive bowel sounds. No hepatosplenomegaly.  MUSCULOSKELETAL: No swelling, clubbing, or edema. Range of motion full in all extremities.  NEUROLOGIC: Cranial nerves II through XII are intact. No gross focal neurological deficits. Sensation intact. Reflexes intact.  SKIN: No ulceration, lesions, rashes, or cyanosis. Skin warm and dry. Turgor intact.  PSYCHIATRIC: Mood, affect within normal limits. The patient is awake, alert and oriented x 3. Insight, judgment intact.       IMAGING     ASSESSMENT/PLAN   Right lung nodule with hilar adenopathy  Hx of RA/Rheumatoid on immunosuppresion  - hx of pulmonary histoplasmosis and cryptococcus  - patient here today for bronchoscopy with airway inspection , theraputic aspiration of tracheobronchial tree, bronchoalveolar lavage, robotic lung biopsy and endobronchial ultrasound assisted lymph node  biopsies -she has moderate pretest probability of lung cancer -    -Reviewed risks/complications and benefits with patient, risks include infection, pneumothorax/pneumomediastinum which may require chest tube placement as well as overnight/prolonged hospitalization and possible mechanical ventilation. Other risks include bleeding and very rarely death.  Patient understands risks and wishes to proceed.  Additional questions were answered, and patient is aware that post  procedure patient will be going home with family and may experience cough with possible clots on expectoration as well as phlegm which may last few days as well as hoarseness of voice post intubation and mechanical ventilation.             Thank you for allowing me to participate in the care of this patient.   Patient/Family are satisfied with care plan and all questions have been answered.    Provider disclosure: Patient with at least one acute or chronic illness or injury that poses a threat to life or bodily function and is being managed actively during this encounter.  All of the below services have been performed independently by signing provider:  review of prior documentation from internal and or external health records.  Review of previous and current lab results.  Interview and comprehensive assessment during patient visit today. Review of current and previous chest radiographs/CT scans. Discussion of management and test interpretation with health care team and patient/family.   This document was prepared using Dragon voice recognition software and may include unintentional dictation errors.     Vida Rigger, M.D.  Division of Pulmonary & Critical Care Medicine

## 2023-06-26 NOTE — Anesthesia Preprocedure Evaluation (Signed)
Anesthesia Evaluation  Patient identified by MRN, date of birth, ID band Patient awake    Reviewed: Allergy & Precautions, NPO status , Patient's Chart, lab work & pertinent test results  History of Anesthesia Complications Negative for: history of anesthetic complications (Slow Emergence)  Airway Mallampati: III       Dental  (+) Upper Dentures, Dental Advidsory Given   Pulmonary shortness of breath, neg sleep apnea, pneumonia, resolved, neg COPD, Not current smoker   Pulmonary exam normal        Cardiovascular hypertension, Pt. on medications + CAD  (-) Past MI and (-) CHF Normal cardiovascular exam(-) dysrhythmias (-) Valvular Problems/Murmurs     Neuro/Psych  Headaches, neg Seizures PSYCHIATRIC DISORDERS Anxiety Depression     Neuromuscular disease    GI/Hepatic Neg liver ROS, Bowel prep,GERD  Medicated and Controlled,,  Endo/Other  diabetes, Type 2, Oral Hypoglycemic Agents Hyperthyroidism   Renal/GU      Musculoskeletal   Abdominal   Peds  Hematology  (+) Blood dyscrasia, anemia   Anesthesia Other Findings . Acute diarrhea, unspecified 09/06/2016  . Allergic state  . Anemia  . Anesthesia complication 10/2020  slow to wake up after back surgery  . Arthritis  . Chicken pox  . Crohn's disease (CMS-HCC)  . Deaf, 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  . Depression  . DJD (degenerative joint disease)  . Exertional dyspnea  . Fatigue  exertional  . Fatty infiltration of liver  . GERD (gastroesophageal reflux disease)  . Hearing aid worn  Bilateral, deaf in left ear  . Hearing aid worn  . Hemorrhoids  . History of blood transfusion 1976  no adverse reactions  . History of pyelonephritis 09/06/2016  . Hypokalemia 09/06/2016  . Immune deficiency disorder, disease or syndrome (CMS-HCC)  . Kidney stones  . Meniere disease  . Migraines  . Osteoporosis, post-menopausal  . Rheumatoid arthritis (CMS-HCC)  .  Rheumatoid arthritis, adult (CMS-HCC)  . Sclerosing mesenteric fibrosis (CMS-HCC)  . Sclerosing mesenteric fibrosis (CMS-HCC)  . Sensory neuropathy  . Sensory neuropathy  . Shingles  . Sleep apnea  . Tremor  . Ulceration  . Wears glasses    Reproductive/Obstetrics                             Anesthesia Physical Anesthesia Plan  ASA: 3  Anesthesia Plan: General ETT and General   Post-op Pain Management:    Induction: Intravenous  PONV Risk Score and Plan: 3 and Ondansetron, Dexamethasone and Midazolam  Airway Management Planned: Oral ETT  Additional Equipment:   Intra-op Plan:   Post-operative Plan: Extubation in OR  Informed Consent: I have reviewed the patients History and Physical, chart, labs and discussed the procedure including the risks, benefits and alternatives for the proposed anesthesia with the patient or authorized representative who has indicated his/her understanding and acceptance.     Dental Advisory Given  Plan Discussed with: Anesthesiologist, CRNA and Surgeon  Anesthesia Plan Comments: (Patient consented for risks of anesthesia including but not limited to:  - adverse reactions to medications - damage to eyes, teeth, lips or other oral mucosa - nerve damage due to positioning  - sore throat or hoarseness - Damage to heart, brain, nerves, lungs, other parts of body or loss of life  Patient voiced understanding and assent.)       Anesthesia Quick Evaluation

## 2023-06-26 NOTE — Procedures (Signed)
ELECTROMAGNETIC NAVIGATIONAL BRONCHOSCOPY PROCEDURE NOTE  FIBEROPTIC BRONCHOSCOPY WITH BRONCHOALVEOLAR LAVAGE and THERAPEUTIC ASPIRATION OF TRACHEOBRONCHIAL TREE PROCEDURE NOTE  ENDOBRONCHIAL ULTRASOUND PROCEDURE NOTE    Flexible bronchoscopy was performed  by : Karna Christmas MD  assistance by : 1)Repiratory therapist  and 2)[employer] staff and 3) Anesthesia team and 4) Flouroscopy team and 5) [facility] supporting staff   Indication for the procedure was :  Pre-procedural H&P. The following assessment was performed on the day of the procedure prior to initiating sedation History:  Chest pain n Dyspnea y Hemoptysis n Cough y Fever n Other pertinent items n  Examination Vital signs -reviewed as per nursing documentation today Cardiac    Murmurs: n  Rubs : n  Gallop: n Lungs Wheezing: n Rales : n Rhonchi :y  Other pertinent findings: SOB/hypoxemia due to chronic lung disease   Pre-procedural assessment for Procedural Sedation included: Depth of sedation: As per anesthesia team  ASA Classification:  2 Mallampati airway assessment: 3    Medication list reviewed: y  The patient's interval history was taken and revealed: no new complaints The pre- procedure physical examination revealed: No new findings Refer to prior clinic note for details.  Informed Consent: Informed consent was obtained from:  patient after explanation of procedure and risks, benefits, as well as alternative procedures available.  Explanation of level of sedation and possible transfusion was also provided.    Procedural Preparation: Time out was performed and patient was identified by name and birthdate and procedure to be performed and side for sampling, if any, was specified. Pt was intubated by anesthesia.  The patient was appropriately draped.   Fiberoptic bronchoscopy with airway inspection and BAL Procedure findings:  Bronchoscope was inserted via ETT  without difficulty.  Posterior  oropharynx, epiglottis, arytenoids, false cords and vocal cords were not visualized as these were bypassed by endotracheal tube. The distal trachea was normal in circumference and appearance without mucosal, cartilaginous or branching abnormalities.  The main carina was mildly splayed . All right and left lobar airways were visualized to the Subsegmental level.  Sub- sub segmental carinae were identified in all the distal airways.   Secretions were visible in the following airways and appeared to be clear.  The mucosa was : friable at RUL  Airways were notable for:        exophytic lesions :n       extrinsic compression in the following distributions: n.       Friable mucosa: y       Teacher, music /pigmentation: n     Media Information  Document Information  Photos  Right upper lobe exophytic lesion  06/26/2023 13:21  Attached To:  Hospital Encounter on 06/26/23  Source Information  Vida Rigger, MD  [facility]  Document History     Post procedure Diagnosis:   EXOPHYTIC LESION OF RIGHT UPPER LOBE     Media Information  Document Information  Photos  Rul lesion  06/26/2023 13:23  Attached To:  Hospital Encounter on 06/26/23  Source Information  Vida Rigger, MD  [facility]  Document History      Navigational Bronchoscopy Procedure Findings:    Post appropriate planning and registration peripheral navigation was used to visualize target lesion.    Post procedure diagnosis: exophytic lesion of RUL  -BAL x 2 at right upper lobe - microbiology -cytobrush x 2 right upper lobe - microbiology -surgical pathology of exophytic lesion - pathology       Endobronchial ultrasound assisted hilar and mediastinal lymph  node biopsies procedure findings: The fiberoptic bronchoscope was removed and the EBUS scope was introduced. Examination began to evaluate for pathologically enlarged lymph nodes starting on the left  side progressing to the right side.  All  lymph node biopsies performed with 21g needle. Lymph node biopsies were sent in cytolite for all stations.   Post procedure diagnosis:  normal lymphadenopathy   Specimens obtained included:                                   Microbiology brushes: RUL; sent for KOH and fugal culture                      Broncho-alveolar lavage site:RUL   sent for fungal cultures                              40 ml volume infused 20 ml volume returned with cellular  appearance   Fluoroscopy Used: yes ;         Immediate sampling complications included:none  Epinephrine NONE ml was used topically  The bronchoscopy was terminated due to completion of the planned procedure and the bronchoscope was removed.   Total dosage of Lidocaine was NONE mg Total fluoroscopy time was NONE  minutes    Estimated Blood loss: EXPECTED < 5 cc.  Complications included:  NONE IMMEDIATE   Preliminary CXR findings :  IN PROCESS   Disposition: HOME WITH HUSBAND   Follow up with Dr. Karna Christmas in 5 days for result discussion.     Vida Rigger MD  [facility] Division of Pulmonary & Critical Care Medicine

## 2023-06-26 NOTE — Anesthesia Procedure Notes (Signed)
Procedure Name: Intubation Date/Time: 06/26/2023 12:39 PM  Performed by: Omer Jack, CRNAPre-anesthesia Checklist: Patient identified, Patient being monitored, Timeout performed, Emergency Drugs available and Suction available Patient Re-evaluated:Patient Re-evaluated prior to induction Oxygen Delivery Method: Circle system utilized Preoxygenation: Pre-oxygenation with 100% oxygen Induction Type: IV induction Ventilation: Mask ventilation without difficulty Laryngoscope Size: Mac and 4 Grade View: Grade I Tube type: Oral Tube size: 8.0 mm Number of attempts: 1 Airway Equipment and Method: Stylet Placement Confirmation: ETT inserted through vocal cords under direct vision, positive ETCO2 and breath sounds checked- equal and bilateral Secured at: 21 cm Tube secured with: Tape Dental Injury: Teeth and Oropharynx as per pre-operative assessment

## 2023-06-27 LAB — SURGICAL PATHOLOGY

## 2023-06-29 LAB — CULTURE, RESPIRATORY W GRAM STAIN
Culture: NO GROWTH
Gram Stain: NONE SEEN

## 2023-07-17 LAB — CULTURE, FUNGUS WITHOUT SMEAR

## 2023-08-10 ENCOUNTER — Other Ambulatory Visit: Payer: Self-pay | Admitting: Surgery

## 2023-08-10 DIAGNOSIS — S42121K Displaced fracture of acromial process, right shoulder, subsequent encounter for fracture with nonunion: Secondary | ICD-10-CM

## 2023-08-10 DIAGNOSIS — Z96611 Presence of right artificial shoulder joint: Secondary | ICD-10-CM

## 2023-08-15 ENCOUNTER — Ambulatory Visit
Admission: RE | Admit: 2023-08-15 | Discharge: 2023-08-15 | Disposition: A | Discharge status: Home / Self Care | Payer: Medicare Other | Source: Ambulatory Visit | Attending: Surgery | Admitting: Surgery

## 2023-08-15 DIAGNOSIS — S42121K Displaced fracture of acromial process, right shoulder, subsequent encounter for fracture with nonunion: Secondary | ICD-10-CM

## 2023-08-15 DIAGNOSIS — Z96611 Presence of right artificial shoulder joint: Secondary | ICD-10-CM

## 2023-09-11 ENCOUNTER — Other Ambulatory Visit: Payer: Self-pay | Admitting: Surgery

## 2023-09-11 DIAGNOSIS — M4807 Spinal stenosis, lumbosacral region: Secondary | ICD-10-CM

## 2023-10-11 ENCOUNTER — Ambulatory Visit: Payer: Medicare Other

## 2023-11-15 ENCOUNTER — Ambulatory Visit
Admission: RE | Admit: 2023-11-15 | Discharge: 2023-11-15 | Disposition: A | Discharge status: Home / Self Care | Source: Ambulatory Visit | Attending: Physician Assistant | Admitting: Physician Assistant

## 2023-11-15 ENCOUNTER — Other Ambulatory Visit: Payer: Self-pay | Admitting: Physician Assistant

## 2023-11-15 DIAGNOSIS — M25472 Effusion, left ankle: Secondary | ICD-10-CM | POA: Insufficient documentation

## 2023-11-22 ENCOUNTER — Other Ambulatory Visit: Payer: Self-pay | Admitting: Internal Medicine

## 2023-11-22 DIAGNOSIS — Z1231 Encounter for screening mammogram for malignant neoplasm of breast: Secondary | ICD-10-CM

## 2023-11-30 ENCOUNTER — Ambulatory Visit
Admission: RE | Admit: 2023-11-30 | Discharge: 2023-11-30 | Disposition: A | Discharge status: Home / Self Care | Source: Ambulatory Visit | Attending: Internal Medicine | Admitting: Internal Medicine

## 2023-11-30 DIAGNOSIS — Z1231 Encounter for screening mammogram for malignant neoplasm of breast: Secondary | ICD-10-CM | POA: Insufficient documentation

## 2023-12-06 NOTE — Progress Notes (Signed)
 Nicole Bass is a  73 y.o. female who presents for  CHIEF COMPLAINT Chief Complaint  Patient presents with  . Follow-up  . Hyperlipidemia  . Diabetes  . Hypertension  . Depression    Subjective: History of Present Illness  Pt in NAD. HTN stable on meds. Has HLD not on statin, DM not on insulin and B12 def on shots. Also with depression on meds with chronic fatigue and chronic pain. Weight stable. Sleeping well with CPAP. No fever or HA's. Denies CP or SOB. Slight cough, no wheezing. No palpitations. No change in bowels or bladder. UTD with MMG and colon. Does c/o LLE edema. Some burning pain.    Past Medical History:  Diagnosis Date  . Acute diarrhea 09/06/2016  . Allergic state   . Anemia   . Anesthesia complication 2021   slow to wake up after back surgery  . Arthritis   . Chicken pox   . Crohn's disease (CMS/HHS-HCC)   . Deaf, left    deaf in her left ear  . Depression   . DJD (degenerative joint disease)   . Exertional dyspnea   . Fatigue    exertional   . Fatty infiltration of liver   . GERD (gastroesophageal reflux disease)   . Hearing aid worn    Bilateral, deaf in left ear  . Hearing aid worn   . Hemorrhoids   . History of blood transfusion 1976   no adverse reactions  . History of pyelonephritis 09/06/2016  . HTN, goal below 140/80 06/04/2018  . Hyperthyroidism 02/08/2018  . Hypokalemia 09/06/2016  . Immune deficiency disorder, disease or syndrome (CMS/HHS-HCC)   . Intractable vomiting with nausea 09/06/2016  . Kidney stones   . Meniere disease   . Migraines   . Osteoporosis, post-menopausal   . Rheumatoid arthritis (CMS/HHS-HCC)   . Rheumatoid arthritis, adult (CMS/HHS-HCC)   . Sclerosing mesenteric fibrosis (CMS/HHS-HCC)   . Sclerosing mesenteric fibrosis (CMS/HHS-HCC)   . Sensory neuropathy   . Sensory neuropathy   . Shingles   . Sleep apnea   . Tremor   . Type 2 diabetes mellitus with diabetic neuropathy (CMS/HHS-HCC) 10/27/2015  .  Ulceration   . Wears glasses    Patient Active Problem List  Diagnosis  . Urolith  . Abdominal pain  . Sensory neuropathy  . Sclerosing mesenteric fibrosis (CMS/HHS-HCC)  . Crohn disease (CMS/HHS-HCC)  . Inflammatory arthritis  . Anemia  . Osteoporosis  . Depression  . B12 deficiency  . Primary osteoarthritis of left knee  . Primary osteoarthritis of right knee  . Rheumatoid arthritis, adult (CMS/HHS-HCC)  . Painful rib  . Closed Colles' fracture of left radius with routine healing, subsequent encounter  . Closed Colles' fracture of right radius with routine healing, subsequent encounter  . Status post open reduction with internal fixation of fracture  . Exertional dyspnea  . Pedal edema  . Acute bilateral low back pain without sciatica  . Closed fracture of distal end of right radius with routine healing  . Intractable vomiting with nausea  . History of pyelonephritis  . Acute diarrhea  . Hypokalemia  . Urinary tract infection without hematuria  . Crohn's disease of colon without complication (CMS/HHS-HCC)  . Impingement syndrome of left shoulder  . Restless leg syndrome  . Tremor  . Chronic low back pain  . Neuropathy  . Leg pain, left  . Chronic pain of left knee  . Peripheral polyneuropathy  . Lumbar radiculopathy  . Right knee  pain  . Imbalance  . Hyperthyroidism  . HTN, goal below 140/80  . Iron deficiency  . Acute kidney injury ()  . Iron deficiency anemia  . Status post right partial knee replacement  . S/P reverse total shoulder arthroplasty, right  . Numbness and tingling in left hand  . Atherosclerosis of native coronary artery of native heart without angina pectoris  . Cervical spondylosis  . Closed displaced fracture of acromial process of right scapula with nonunion  . Rotator cuff tendinitis, left  . Hypomagnesemia  . Aftercare for healing traumatic fracture of lower arm  . Cryptococcus (CMS/HHS-HCC)  . Pneumonia  . Swelling of limb  .  Syncope  . Type 2 diabetes mellitus with diabetic neuropathy (CMS/HHS-HCC)  . Varicose veins of leg with pain, left  . Swelling of limb  . Type 2 diabetes mellitus with diabetic neuropathy (CMS/HHS-HCC)  . Mixed hyperlipidemia  . Fusion of spine of lumbosacral region  . BMI 32.0-32.9,adult  . Macular hole of right eye  . Gastroesophageal reflux disease without esophagitis  . OSA (obstructive sleep apnea)  . Macular hole, right eye  . Epiretinal membrane (ERM) of right eye  . Pseudophakia of right eye  . Vitreomacular traction syndrome of both eyes  . Diabetes mellitus type 2 without retinopathy (CMS/HHS-HCC)  . Vitreomacular adhesion of left eye  . Pseudophakia of both eyes  . Ocular rosacea  . Choroidal nevus of right eye  . Elevated troponin  . Left wrist fracture  . Weakness  . Lung nodule  . Myocardial injury  . At high risk for injury related to fall  . Ankle fracture  . Fall  . Venous insufficiency  . Lumbar spondylosis    Past Surgical History:  Procedure Laterality Date  . CESAREAN SECTION  1976  . CESAREAN SECTION  1980  . TONSILLECTOMY  1994  . APPENDECTOMY  1998  . CHOLECYSTECTOMY OPEN  1999  . DILATION AND CURETTAGE, DIAGNOSTIC / THERAPEUTIC  2004  . HYSTERECTOMY SUPRACERVICAL ABDOMINAL W/REMOVAL TUBES &/OR OVARIES  2005  . REPLACEMENT TOTAL KNEE Right 2015  . T8 vertebral body biopsy and kyphoplasty  06/10/2014  . Medial compartment MAKOplasty procedure on the right Right 08/04/2014  . Bilateral open reduction internal fixation of distal radius fractures utilizing [employer] volar plates Bilateral 09/15/2014   Bilateral Distal Radius  . KYPHOPLASTY  06/04/2015  . SHOULDER ARTHROSCOPY DISTAL CLAVICLE EXCISION AND OPEN ROTATOR CUFF REPAIR Right 04/13/2016  . COLONOSCOPY  04/18/2017   PH Crohn's Disease: CBF 04/2022  . left hand tendon  Left 12/2018  . Reverse right total shoulder arthroplasty Right 04/18/2019   Dr.Poggi   . OPEN L5/S1  TRANSFORAMINAL LUMBAR INTERBODY FUSION  Left 10/12/2020   Dr. Elspeth Ahle at [facility], [place]  . LENS EYE SURGERY Right 07/06/2021   PCIOL  . EXTRACTION CATARACT EXTRACAPSULAR W/INSERTION INTRAOCULAR PROSTHESIS Right 07/06/2021   Procedure: LENSX--EXTRACTION CATARACT EXTRACAPSULAR W/INSERTION INTRAOCULAR PROSTHESIS;  Surgeon: Jefm Veleria Sauers, MD;  [facility];  Service: Ophthalmology;  Laterality: Right;  . INSJ RX ELUTING IMPLT PUNCTAL DILAT LAC CANAL EA  Right 07/06/2021   Procedure: DEXTENZA ----Insertion of drug-eluting implant, including punctal dilation when performed, into lacrimal canaliculus, each;  Surgeon: Jefm Veleria Sauers, MD;  [facility];  Service: Ophthalmology;  Laterality: Right;  . EXTRACTION CATARACT EXTRACAPSULAR W/INSERTION INTRAOCULAR PROSTHESIS Left 07/27/2021   Procedure: LENSX--EXTRACTION CATARACT EXTRACAPSULAR W/INSERTION INTRAOCULAR PROSTHESIS;  Surgeon: Jefm Veleria Sauers, MD;  [facility];  Service:  Ophthalmology;  Laterality: Left;  . INSJ RX ELUTING IMPLT PUNCTAL DILAT LAC CANAL EA  Left 07/27/2021   Procedure: DEXTENZA ----Insertion of drug-eluting implant, including punctal dilation when performed, into lacrimal canaliculus, each;  Surgeon: Jefm Veleria Sauers, MD;  [facility];  Service: Ophthalmology;  Laterality: Left;  . COLONOSCOPY  08/02/2021   Normal colon biopsy/PHx of Crohn's/Repeat 3 to 83yrs/CTL  . VITRECTOMY W/REMOVAL INTERNAL LIMITING MEMBRANE RETINA BY PARS PLANA Right 09/08/2022   Procedure: VITRECTOMY; 25G, MECHANICAL, PARS PLANA APPROACH; WITH REMOVAL OF INTERNAL LIMITING MEMBRANE OF RETINA, INCLUDES, IF PERFORMED, INTRAOCULAR TAMPONADE (IE, AIR,GAS OR SILICONE OIL);  Surgeon: Skeet Marcey Heinz, MD;  [facility];  Service: Ophthalmology;  Laterality: Right;  . Austin hallux valgus correction with K-wire fixation right foot 12/27/2012    . COLONOSCOPY  11/26/2012,  01/02/2009, 09/29/2007, 11/28/2006, 07/28/2003, 12/01/1999, 09/26/1996   PH Crohn's Disease: CBF 11/2017  . EGD  11/26/2012, 01/02/2009, 09/29/2007, 11/28/2006, 06/24/2003, 12/01/1999, 09/26/1996  . Hospitalization for Shigella dysentery    . L1 Kyphoplasty 04/14/15    . L5 Kyphoplasty 06/04/15    . Left elbow surgery    . OOPHORECTOMY    . Partial bowel resection     involving the distal small bowel and proximal large bowel due to Crohn's by Dr. Dellie in 1998 [facility]).  . right  rotator cuff tear    . Right first MTP osteotomy    . SPINE SURGERY       Current Outpatient Medications:  .  acetaminophen  (TYLENOL ) 650 MG ER tablet, Take 1 tablet (650 mg total) by mouth as needed, Disp: , Rfl:  .  alendronate  (FOSAMAX ) 70 MG tablet, TAKE 1 TABLET BY MOUTH WEEKLY  WITH 8 OZ OF PLAIN WATER 30  MINUTES BEFORE FIRST FOOD, DRINK OR MEDS. STAY UPRIGHT FOR 30  MINS, Disp: 12 tablet, Rfl: 3 .  ALPRAZolam  (XANAX ) 0.25 MG tablet, Take by mouth, Disp: , Rfl:  .  amoxicillin  (AMOXIL ) 500 MG capsule, 4 tablets prior to dental procedure, Disp: 4 capsule, Rfl: 5 .  BIOTIN  ORAL, Take 1 tablet by mouth once daily, Disp: , Rfl:  .  cetirizine (ZYRTEC) 10 MG tablet, Take 10 mg by mouth once daily, Disp: , Rfl:  .  chlorhexidine  (PERIDEX ) 0.12 % solution, Swish and spit 15 mLs 2 (two) times daily, Disp: 473 mL, Rfl: 5 .  cholecalciferol  (VITAMIN D3) 1000 unit tablet, Take 1 tablet (1,000 Units total) by mouth once daily, Disp: , Rfl:  .  citalopram  (CELEXA ) 10 MG tablet, TAKE 1 TABLET BY MOUTH ONCE  DAILY, Disp: 90 tablet, Rfl: 3 .  fenofibrate nanocrystallized (TRICOR) 145 MG tablet, Take 1 tablet (145 mg total) by mouth once daily, Disp: 90 tablet, Rfl: 3 .  ferrous sulfate  325 (65 FE) MG EC tablet, Take 325 mg by mouth daily with breakfast, Disp: , Rfl:  .  gabapentin  (NEURONTIN ) 100 MG capsule, TAKE 1 CAPSULE BY MOUTH IN THE  MORNING, Disp: 100 capsule, Rfl: 2 .  gabapentin  (NEURONTIN ) 400 MG capsule, TAKE 1  CAPSULE BY MOUTH AT  BEDTIME, Disp: 90 capsule, Rfl: 3 .  ketorolac  (ACULAR ) 0.5 % ophthalmic solution, Place 1 drop into the right eye 2 (two) times daily, Disp: 5 mL, Rfl: 3 .  LACTOBAC NO.41/BIFIDOBACT NO.7 (PROBIOTIC-10 ORAL), Take by mouth 2 (two) times daily.  , Disp: , Rfl:  .  magnesium  oxide (MAG-OX) 400 mg (241.3 mg magnesium ) tablet, TAKE 1 TABLET(400 MG) BY MOUTH EVERY DAY, Disp:  90 tablet, Rfl: 3 .  multivitamin tablet, Take 1 tablet by mouth once daily, Disp: , Rfl:  .  omeprazole (PRILOSEC) 40 MG DR capsule, TAKE 1 CAPSULE BY MOUTH TWICE  DAILY, Disp: 200 capsule, Rfl: 2 .  ondansetron  (ZOFRAN -ODT) 4 MG disintegrating tablet, Take 1 tablet (4 mg total) by mouth every 8 (eight) hours as needed for Nausea May take TWO at a time IF NEEDED., Disp: 20 tablet, Rfl: 0 .  potassium chloride  (KLOR-CON  M20) 20 MEQ ER tablet, TAKE 1 TABLET BY MOUTH ONCE  DAILY, Disp: 100 tablet, Rfl: 2 .  prednisoLONE  acetate (PRED FORTE ) 1 % ophthalmic suspension, Place 1 drop into the right eye 2 (two) times daily, Disp: 5 mL, Rfl: 3 .  predniSONE  (DELTASONE ) 1 MG tablet, Take 3 tablets (3 mg total) by mouth once daily, Disp: 90 tablet, Rfl: 5 .  sodium chloride  3 % nebulizer solution, Take 4 mLs by nebulization once daily as needed for Cough, Disp: 480 mL, Rfl: 2 .  tofacitinib  (XELJANZ ) 5 mg immediate release tablet, Take 1 tablet (5 mg total) by mouth 2 (two) times daily, Disp: 180 tablet, Rfl: 3 .  traMADoL  (ULTRAM ) 50 mg tablet, Take 1 tablet (50 mg total) by mouth every 6 (six) hours as needed for Pain, Disp: 90 tablet, Rfl: 0 .  triamcinolone 0.1 % cream, APPLY TOPICALLY TO RASH TWICE DAILY AS NEEDED. STOP WHEN IMPROVED. MAY USE UP TO 2 WEEKS, Disp: , Rfl:  .  dorzolamide  (TRUSOPT ) 2 % ophthalmic solution, Place 1 drop into the right eye 2 (two) times daily, Disp: 10 mL, Rfl: 3  Augmentin [amoxicillin -pot clavulanate], Ciprofloxacin, Levaquin  [levofloxacin ], Methotrexate, Bactrim  [sulfamethoxazole-trimethoprim], Doxycycline, Keflex [cephalexin], Rosuvastatin , Trelegy ellipta [fluticasone -umeclidin-vilanter], Cefprozil, Enbrel [etanercept], Humira [adalimumab], Hydrocodone -acetaminophen , Lorabid [loracarbef], Meropenem, Oxycodone , Oxycodone -acetaminophen , and Primidone  Social History   Socioeconomic History  . Marital status: Married    Spouse name: Latrise Bowland  . Number of children: 4  Occupational History  . Occupation: retired  Tobacco Use  . Smoking status: Never    Passive exposure: Never  . Smokeless tobacco: Never  Vaping Use  . Vaping status: Never Used  Substance and Sexual Activity  . Alcohol use: No    Alcohol/week: 0.0 standard drinks of alcohol  . Drug use: No  . Sexual activity: Defer    Partners: Male   Social Drivers of Health   Financial Resource Strain: Low Risk  (06/13/2023)   Overall Financial Resource Strain (CARDIA)   . Difficulty of Paying Living Expenses: Not hard at all  Food Insecurity: No Food Insecurity (06/13/2023)   Hunger Vital Sign   . Worried About Programme researcher, broadcasting/film/video in the Last Year: Never true   . Ran Out of Food in the Last Year: Never true  Transportation Needs: No Transportation Needs (06/13/2023)   PRAPARE - Transportation   . Lack of Transportation (Medical): No   . Lack of Transportation (Non-Medical): No  Physical Activity: Unknown (08/23/2018)   Received from [facility], [place]   Exercise Vital Sign   . Days of Exercise per Week: Patient declined   . Minutes of Exercise per Session: Patient declined  Stress: Unknown (08/23/2018)   Received from [facility], [facility]   [facility]ational [facility] - Occupational Stress Questionnaire   . Feeling of Stress : Patient declined  Social Connections: Unknown (08/23/2018)   Received from [facility], [facility]   Social Connection and Isolation Panel [NHANES]   . Frequency of  Communication with Friends and Family: Patient declined   .  Frequency of Social Gatherings with Friends and Family: Patient declined   . Attends Religious Services: Patient declined   . Active Member of Clubs or Organizations: Patient declined   . Attends Banker Meetings: Patient declined   . Marital Status: Patient declined  Housing Stability: Unknown (09/08/2023)   Housing Stability Vital Sign   . Unable to Pay for Housing in the Last Year: No   . Homeless in the Last Year: No    Family History  Problem Relation Name Age of Onset  . Coronary Artery Disease (Blocked arteries around heart) Mother    . Diabetes Mother    . Myocardial Infarction (Heart attack) Mother    . Emphysema Mother    . Obesity Mother    . Migraines Father    . Emphysema Father    . Lupus Father    . Clotting disorder Maternal Grandmother         coumadin  . Dementia Maternal Grandmother    . Myocardial Infarction (Heart attack) Maternal Grandmother         COD  . Colon cancer Maternal Grandfather    . Parkinsonism Maternal Grandfather    . No Known Problems Paternal Grandmother    . Rheum arthritis Paternal Grandfather    . No Known Problems Daughter    . No Known Problems Son    . Glaucoma Neg Hx    . Macular degeneration Neg Hx    . Anesthesia problems Neg Hx      A comprehensive ROS was negative except for HPI  PE: BP 130/72   Pulse 76   Ht 157.5 cm (5' 2)   Wt 76.7 kg (169 lb)   LMP  (LMP Unknown)   SpO2 96%   BMI 30.91 kg/m  General: Alert oriented x3   Eyes: Sclera and conjunctiva clear; pupils equal round and reactive to light and accommodation; extraocular movements intact  Nose: Mucosa healthy without drainage or ulceration Oropharynx: No suspicious lesions Neck: No swelling, masses, stiffness, pain, limited movement, carotid pulses normal bilaterally, thyroid normal size, no masses palpated. No bruits heard. Lungs: Respirations unlabored; clear to auscultation bilaterally Back: No spinal deformity Cardiovascular: Heart  regular rate and rhythm without murmurs, gallops, or rubs Abdomen: Soft; non tender; non distended;  no masses or organomegaly Lymph Nodes: No significant cervical, supraclavicular, or axillary lymphadenopathy noted Musculoskeletal: No active joint inflammation Extremities: Normal, no edema Pulses: Dorsalis pedis palpable and symmetric bilaterally Neurologic: Alert and oriented; speech intact; face symmetrical; moves all extremities well    Appointment on 11/28/2023  Component Date Value Ref Range Status  . WBC (White Blood Cell Count) 11/28/2023 3.8 (L)  4.1 - 10.2 10^3/uL Final  . RBC (Red Blood Cell Count) 11/28/2023 3.15 (L)  4.04 - 5.48 10^6/uL Final  . Hemoglobin 11/28/2023 10.3 (L)  12.0 - 15.0 gm/dL Final  . Hematocrit 95/84/7974 31.2 (L)  35.0 - 47.0 % Final  . MCV (Mean Corpuscular Volume) 11/28/2023 99.0  80.0 - 100.0 fl Final  . MCH (Mean Corpuscular Hemoglobin) 11/28/2023 32.7 (H)  27.0 - 31.2 pg Final  . MCHC (Mean Corpuscular Hemoglobin * 11/28/2023 33.0  32.0 - 36.0 gm/dL Final  . Platelet Count 11/28/2023 220  150 - 450 10^3/uL Final  . RDW-CV (Red Cell Distribution Widt* 11/28/2023 12.2  11.6 - 14.8 % Final  . MPV (Mean Platelet Volume) 11/28/2023 9.5  9.4 - 12.4 fl Final  .  Neutrophils 11/28/2023 2.10  1.50 - 7.80 10^3/uL Final  . Lymphocytes 11/28/2023 1.01  1.00 - 3.60 10^3/uL Final  . Monocytes 11/28/2023 0.53  0.00 - 1.50 10^3/uL Final  . Eosinophils 11/28/2023 0.06  0.00 - 0.55 10^3/uL Final  . Basophils 11/28/2023 0.03  0.00 - 0.09 10^3/uL Final  . Neutrophil % 11/28/2023 56.1  32.0 - 70.0 % Final  . Lymphocyte % 11/28/2023 26.9  10.0 - 50.0 % Final  . Monocyte % 11/28/2023 14.1 (H)  4.0 - 13.0 % Final  . Eosinophil % 11/28/2023 1.6  1.0 - 5.0 % Final  . Basophil% 11/28/2023 0.8  0.0 - 2.0 % Final  . Immature Granulocyte % 11/28/2023 0.5  <=0.7 % Final  . Immature Granulocyte Count 11/28/2023 0.02  <=0.06 10^3/L Final  . Glucose 11/28/2023 88  70 - 110 mg/dL  Final  . Sodium 95/84/7974 143  136 - 145 mmol/L Final  . Potassium 11/28/2023 3.8  3.6 - 5.1 mmol/L Final  . Chloride 11/28/2023 107  97 - 109 mmol/L Final  . Carbon Dioxide (CO2) 11/28/2023 30.2  22.0 - 32.0 mmol/L Final  . Urea Nitrogen (BUN) 11/28/2023 16  7 - 25 mg/dL Final  . Creatinine 95/84/7974 1.0  0.6 - 1.1 mg/dL Final  . Glomerular Filtration Rate (eGFR) 11/28/2023 60 (L)  >60 mL/min/1.73sq m Final  . Calcium  11/28/2023 9.6  8.7 - 10.3 mg/dL Final  . AST  95/84/7974 33  8 - 39 U/L Final  . ALT  11/28/2023 17  5 - 38 U/L Final  . Alk Phos (alkaline Phosphatase) 11/28/2023 51  34 - 104 U/L Final  . Albumin 11/28/2023 4.3  3.5 - 4.8 g/dL Final  . Bilirubin, Total 11/28/2023 0.5  0.3 - 1.2 mg/dL Final  . Protein, Total 11/28/2023 6.4  6.1 - 7.9 g/dL Final  . A/G Ratio 95/84/7974 2.0  1.0 - 5.0 gm/dL Final  . Cholesterol, Total 11/28/2023 177  100 - 200 mg/dL Final  . Triglyceride 95/84/7974 95  35 - 199 mg/dL Final  . HDL (High Density Lipoprotein) Cho* 11/28/2023 75.1  35.0 - 85.0 mg/dL Final  . LDL Calculated 11/28/2023 83  0 - 130 mg/dL Final  . VLDL Cholesterol 11/28/2023 19  mg/dL Final  . Cholesterol/HDL Ratio 11/28/2023 2.4   Final  . Thyroid Stimulating Hormone (TSH) 11/28/2023 1.211  0.450-5.330 uIU/ml uIU/mL Final  . Color 11/28/2023 Light Yellow  Colorless, Straw, Light Yellow, Yellow, Dark Yellow Final  . Clarity 11/28/2023 Clear  Clear Final  . Specific Gravity 11/28/2023 1.012  1.005 - 1.030 Final  . pH, Urine 11/28/2023 6.0  5.0 - 8.0 Final  . Protein, Urinalysis 11/28/2023 Negative  Negative mg/dL Final  . Glucose, Urinalysis 11/28/2023 Negative  Negative mg/dL Final  . Ketones, Urinalysis 11/28/2023 Negative  Negative mg/dL Final  . Blood, Urinalysis 11/28/2023 Negative  Negative Final  . Nitrite, Urinalysis 11/28/2023 Negative  Negative Final  . Leukocyte Esterase, Urinalysis 11/28/2023 Trace (!)  Negative Final  . Bilirubin, Urinalysis 11/28/2023 Negative   Negative Final  . Urobilinogen, Urinalysis 11/28/2023 0.2  0.2 - 1.0 mg/dL Final  . WBC, UA 95/84/7974 2  <=5 /hpf Final  . Red Blood Cells, Urinalysis 11/28/2023 <1  <=3 /hpf Final  . Bacteria, Urinalysis 11/28/2023 0-5  0 - 5 /hpf Final  . Squamous Epithelial Cells, Urinaly* 11/28/2023 0  /hpf Final  . Hemoglobin A1C 11/28/2023 6.4 (H)  4.2 - 5.6 % Final  . Average Blood Glucose (Calc) 11/28/2023 137  mg/dL Final  . Creatinine, Random Urine 11/28/2023 111.2  37.0 - 250.0 mg/dL Final  . Urine Albumin, Random 11/28/2023 <7    mg/L Final  . Urine Albumin/Creatinine Ratio 11/28/2023 <6.3  <30.0 ug/mg Final  Office Visit on 10/11/2023  Component Date Value Ref Range Status  . Influenza A PCR 10/11/2023 Negative  Negative Final  . Influenza B PCR 10/11/2023 Negative  Negative Final  . RSV PCR 10/11/2023 Negative  Negative Final  . SARS-CoV2 PCR 10/11/2023 Negative  Negative Final  Office Visit on 09/14/2023  Component Date Value Ref Range Status  . Hemoglobin A1C 09/14/2023 5.9 (H)  4.2 - 5.6 % Final  . Average Blood Glucose (Calc) 09/14/2023 123  mg/dL Final  Appointment on 88/77/7975  Component Date Value Ref Range Status  . Color 07/07/2023 Yellow  Colorless, Straw, Light Yellow, Yellow, Dark Yellow Final  . Clarity 07/07/2023 Cloudy (!)  Clear Final  . Specific Gravity 07/07/2023 1.022  1.005 - 1.030 Final  . pH, Urine 07/07/2023 6.0  5.0 - 8.0 Final  . Protein, Urinalysis 07/07/2023 Trace (!)  Negative mg/dL Final  . Glucose, Urinalysis 07/07/2023 Negative  Negative mg/dL Final  . Ketones, Urinalysis 07/07/2023 Negative  Negative mg/dL Final  . Blood, Urinalysis 07/07/2023 Trace (!)  Negative Final  . Nitrite, Urinalysis 07/07/2023 Negative  Negative Final  . Leukocyte Esterase, Urinalysis 07/07/2023 3+ (!)  Negative Final  . Bilirubin, Urinalysis 07/07/2023 Negative  Negative Final  . Urobilinogen, Urinalysis 07/07/2023 0.2  0.2 - 1.0 mg/dL Final  . WBC, UA 88/77/7975 >182 (H)   <=5 /hpf Final  . Red Blood Cells, Urinalysis 07/07/2023 9 (H)  <=3 /hpf Final  . Bacteria, Urinalysis 07/07/2023 0-5  0 - 5 /hpf Final  . Squamous Epithelial Cells, Urinaly* 07/07/2023 0  /hpf Final  . Urine Culture, Routine - [facility] 07/07/2023 Final report (!)   Final  . Result 1 - [facility] 07/07/2023 Proteus mirabilis (!)   Final  . Result 2 - [facility] 07/07/2023 Comment   Final  . Antimicrobial Susceptibility - Lab* 07/07/2023 Comment   Final  Office Visit on 06/13/2023  Component Date Value Ref Range Status  . Cholesterol, Total 06/13/2023 225 (H)  100 - 200 mg/dL Final  . Triglyceride 89/70/7975 368 (H)  35 - 199 mg/dL Final  . HDL (High Density Lipoprotein) Cho* 06/13/2023 71.9  35.0 - 85.0 mg/dL Final  . LDL Calculated 06/13/2023 80  0 - 130 mg/dL Final  . VLDL Cholesterol 06/13/2023 74  mg/dL Final  . Cholesterol/HDL Ratio 06/13/2023 3.1   Final  . WBC (White Blood Cell Count) 06/13/2023 6.2  4.1 - 10.2 10^3/uL Final  . RBC (Red Blood Cell Count) 06/13/2023 3.33 (L)  4.04 - 5.48 10^6/uL Final  . Hemoglobin 06/13/2023 11.1 (L)  12.0 - 15.0 gm/dL Final  . Hematocrit 89/70/7975 32.8 (L)  35.0 - 47.0 % Final  . MCV (Mean Corpuscular Volume) 06/13/2023 98.5  80.0 - 100.0 fl Final  . MCH (Mean Corpuscular Hemoglobin) 06/13/2023 33.3 (H)  27.0 - 31.2 pg Final  . MCHC (Mean Corpuscular Hemoglobin * 06/13/2023 33.8  32.0 - 36.0 gm/dL Final  . Platelet Count 06/13/2023 237  150 - 450 10^3/uL Final  . RDW-CV (Red Cell Distribution Widt* 06/13/2023 13.3  11.6 - 14.8 % Final  . MPV (Mean Platelet Volume) 06/13/2023 9.7  9.4 - 12.4 fl Final  . Neutrophils 06/13/2023 4.39  1.50 - 7.80 10^3/uL Final  . Lymphocytes 06/13/2023 0.81 (L)  1.00 -  3.60 10^3/uL Final  . Monocytes 06/13/2023 0.81  0.00 - 1.50 10^3/uL Final  . Eosinophils 06/13/2023 0.09  0.00 - 0.55 10^3/uL Final  . Basophils 06/13/2023 0.04  0.00 - 0.09 10^3/uL Final  . Neutrophil % 06/13/2023 71.2 (H)  32.0 - 70.0 % Final  .  Lymphocyte % 06/13/2023 13.2  10.0 - 50.0 % Final  . Monocyte % 06/13/2023 13.2 (H)  4.0 - 13.0 % Final  . Eosinophil % 06/13/2023 1.5  1.0 - 5.0 % Final  . Basophil% 06/13/2023 0.7  0.0 - 2.0 % Final  . Immature Granulocyte % 06/13/2023 0.2  <=0.7 % Final  . Immature Granulocyte Count 06/13/2023 0.01  <=0.06 10^3/L Final  . Glucose 06/13/2023 104  70 - 110 mg/dL Final  . Sodium 89/70/7975 142  136 - 145 mmol/L Final  . Potassium 06/13/2023 3.6  3.6 - 5.1 mmol/L Final  . Chloride 06/13/2023 106  97 - 109 mmol/L Final  . Carbon Dioxide (CO2) 06/13/2023 26.7  22.0 - 32.0 mmol/L Final  . Urea Nitrogen (BUN) 06/13/2023 16  7 - 25 mg/dL Final  . Creatinine 89/70/7975 0.9  0.6 - 1.1 mg/dL Final  . Glomerular Filtration Rate (eGFR) 06/13/2023 69  >60 mL/min/1.73sq m Final  . Calcium  06/13/2023 9.9  8.7 - 10.3 mg/dL Final  . AST  89/70/7975 29  8 - 39 U/L Final  . ALT  06/13/2023 17  5 - 38 U/L Final  . Alk Phos (alkaline Phosphatase) 06/13/2023 88  34 - 104 U/L Final  . Albumin 06/13/2023 4.3  3.5 - 4.8 g/dL Final  . Bilirubin, Total 06/13/2023 0.3  0.3 - 1.2 mg/dL Final  . Protein, Total 06/13/2023 6.6  6.1 - 7.9 g/dL Final  . A/G Ratio 89/70/7975 1.9  1.0 - 5.0 gm/dL Final  . Color 89/70/7975 Yellow  Colorless, Straw, Light Yellow, Yellow, Dark Yellow Final  . Clarity 06/13/2023 Clear  Clear Final  . Specific Gravity 06/13/2023 1.021  1.005 - 1.030 Final  . pH, Urine 06/13/2023 6.0  5.0 - 8.0 Final  . Protein, Urinalysis 06/13/2023 Trace (!)  Negative mg/dL Final  . Glucose, Urinalysis 06/13/2023 Negative  Negative mg/dL Final  . Ketones, Urinalysis 06/13/2023 Negative  Negative mg/dL Final  . Blood, Urinalysis 06/13/2023 Negative  Negative Final  . Nitrite, Urinalysis 06/13/2023 Negative  Negative Final  . Leukocyte Esterase, Urinalysis 06/13/2023 Negative  Negative Final  . Bilirubin, Urinalysis 06/13/2023 Negative  Negative Final  . Urobilinogen, Urinalysis 06/13/2023 0.2  0.2 - 1.0  mg/dL Final  . Mucous, Urine 06/13/2023 PRESENT (!)  None Seen Final  . WBC, UA 06/13/2023 2  <=5 /hpf Final  . Red Blood Cells, Urinalysis 06/13/2023 2  <=3 /hpf Final  . Bacteria, Urinalysis 06/13/2023 0-5  0 - 5 /hpf Final  . Squamous Epithelial Cells, Urinaly* 06/13/2023 0  /hpf Final  . Hemoglobin A1C 06/13/2023 6.0 (H)  4.2 - 5.6 % Final  . Average Blood Glucose (Calc) 06/13/2023 126  mg/dL Final  . Thyroid Stimulating Hormone (TSH) 06/13/2023 1.048  0.450-5.330 uIU/ml uIU/mL Final  Appointment on 04/03/2023  Component Date Value Ref Range Status  . Glucose 04/03/2023 150 (H)  70 - 110 mg/dL Final  . Sodium 91/80/7975 140  136 - 145 mmol/L Final  . Potassium 04/03/2023 4.3  3.6 - 5.1 mmol/L Final  . Chloride 04/03/2023 105  97 - 109 mmol/L Final  . Carbon Dioxide (CO2) 04/03/2023 22.3  22.0 - 32.0 mmol/L Final  . Urea  Nitrogen (BUN) 04/03/2023 22  7 - 25 mg/dL Final  . Creatinine 91/80/7975 1.0  0.6 - 1.1 mg/dL Final  . Glomerular Filtration Rate (eGFR) 04/03/2023 61  >60 mL/min/1.73sq m Final  . Calcium  04/03/2023 9.3  8.7 - 10.3 mg/dL Final  . Albumin 91/80/7975 4.0  3.5 - 4.8 g/dL Final  . Phosphorus 91/80/7975 3.2  2.5 - 5.0 mg/dL Final  Office Visit on 03/02/2023  Component Date Value Ref Range Status  . WBC (White Blood Cell Count) 03/02/2023 6.4  4.1 - 10.2 10^3/uL Final  . RBC (Red Blood Cell Count) 03/02/2023 3.17 (L)  4.04 - 5.48 10^6/uL Final  . Hemoglobin 03/02/2023 10.0 (L)  12.0 - 15.0 gm/dL Final  . Hematocrit 92/81/7975 31.2 (L)  35.0 - 47.0 % Final  . MCV (Mean Corpuscular Volume) 03/02/2023 98.4  80.0 - 100.0 fl Final  . MCH (Mean Corpuscular Hemoglobin) 03/02/2023 31.5 (H)  27.0 - 31.2 pg Final  . MCHC (Mean Corpuscular Hemoglobin * 03/02/2023 32.1  32.0 - 36.0 gm/dL Final  . Platelet Count 03/02/2023 268  150 - 450 10^3/uL Final  . RDW-CV (Red Cell Distribution Widt* 03/02/2023 14.6  11.6 - 14.8 % Final  . MPV (Mean Platelet Volume) 03/02/2023 9.4  9.4 -  12.4 fl Final  . Neutrophils 03/02/2023 4.96  1.50 - 7.80 10^3/uL Final  . Lymphocytes 03/02/2023 0.66 (L)  1.00 - 3.60 10^3/uL Final  . Monocytes 03/02/2023 0.62  0.00 - 1.50 10^3/uL Final  . Eosinophils 03/02/2023 0.07  0.00 - 0.55 10^3/uL Final  . Basophils 03/02/2023 0.03  0.00 - 0.09 10^3/uL Final  . Neutrophil % 03/02/2023 78.0 (H)  32.0 - 70.0 % Final  . Lymphocyte % 03/02/2023 10.4  10.0 - 50.0 % Final  . Monocyte % 03/02/2023 9.7  4.0 - 13.0 % Final  . Eosinophil % 03/02/2023 1.1  1.0 - 5.0 % Final  . Basophil% 03/02/2023 0.5  0.0 - 2.0 % Final  . Immature Granulocyte % 03/02/2023 0.3  <=0.7 % Final  . Immature Granulocyte Count 03/02/2023 0.02  <=0.06 10^3/L Final  . Glucose 03/02/2023 119 (H)  70 - 110 mg/dL Final  . Sodium 92/81/7975 141  136 - 145 mmol/L Final  . Potassium 03/02/2023 4.1  3.6 - 5.1 mmol/L Final  . Chloride 03/02/2023 103  97 - 109 mmol/L Final  . Carbon Dioxide (CO2) 03/02/2023 31.5  22.0 - 32.0 mmol/L Final  . Urea Nitrogen (BUN) 03/02/2023 18  7 - 25 mg/dL Final  . Creatinine 92/81/7975 1.1  0.6 - 1.1 mg/dL Final  . Glomerular Filtration Rate (eGFR) 03/02/2023 54 (L)  >60 mL/min/1.73sq m Final  . Calcium  03/02/2023 9.5  8.7 - 10.3 mg/dL Final  . AST  92/81/7975 27  8 - 39 U/L Final  . ALT  03/02/2023 15  5 - 38 U/L Final  . Alk Phos (alkaline Phosphatase) 03/02/2023 90  34 - 104 U/L Final  . Albumin 03/02/2023 4.1  3.5 - 4.8 g/dL Final  . Bilirubin, Total 03/02/2023 0.4  0.3 - 1.2 mg/dL Final  . Protein, Total 03/02/2023 6.8  6.1 - 7.9 g/dL Final  . A/G Ratio 92/81/7975 1.5  1.0 - 5.0 gm/dL Final  . Sedimentation Rate-Automated 03/02/2023 102 (H)  0 - 30 mm/hr Final  . Color 03/02/2023 Yellow  Colorless, Straw, Light Yellow, Yellow, Dark Yellow Final  . Clarity 03/02/2023 Clear  Clear Final  . Specific Gravity 03/02/2023 1.020  1.005 - 1.030 Final  . pH, Urine  03/02/2023 5.5  5.0 - 8.0 Final  . Protein, Urinalysis 03/02/2023 Trace (!)  Negative mg/dL  Final  . Glucose, Urinalysis 03/02/2023 Negative  Negative mg/dL Final  . Ketones, Urinalysis 03/02/2023 Negative  Negative mg/dL Final  . Blood, Urinalysis 03/02/2023 Negative  Negative Final  . Nitrite, Urinalysis 03/02/2023 Negative  Negative Final  . Leukocyte Esterase, Urinalysis 03/02/2023 Negative  Negative Final  . Bilirubin, Urinalysis 03/02/2023 Negative  Negative Final  . Urobilinogen, Urinalysis 03/02/2023 0.2  0.2 - 1.0 mg/dL Final  . WBC, UA 92/81/7975 1  <=5 /hpf Final  . Red Blood Cells, Urinalysis 03/02/2023 1  <=3 /hpf Final  . Bacteria, Urinalysis 03/02/2023 0-5  0 - 5 /hpf Final  . Squamous Epithelial Cells, Urinaly* 03/02/2023 0  /hpf Final  Office Visit on 01/13/2023  Component Date Value Ref Range Status  . WBC (White Blood Cell Count) 01/13/2023 7.1  4.1 - 10.2 10^3/uL Final  . RBC (Red Blood Cell Count) 01/13/2023 3.22 (L)  4.04 - 5.48 10^6/uL Final  . Hemoglobin 01/13/2023 10.1 (L)  12.0 - 15.0 gm/dL Final  . Hematocrit 94/68/7975 32.8 (L)  35.0 - 47.0 % Final  . MCV (Mean Corpuscular Volume) 01/13/2023 101.9 (H)  80.0 - 100.0 fl Final  . MCH (Mean Corpuscular Hemoglobin) 01/13/2023 31.4 (H)  27.0 - 31.2 pg Final  . MCHC (Mean Corpuscular Hemoglobin * 01/13/2023 30.8 (L)  32.0 - 36.0 gm/dL Final  . Platelet Count 01/13/2023 232  150 - 450 10^3/uL Final  . RDW-CV (Red Cell Distribution Widt* 01/13/2023 14.5  11.6 - 14.8 % Final  . MPV (Mean Platelet Volume) 01/13/2023 10.4  9.4 - 12.4 fl Final  . Neutrophils 01/13/2023 5.34  1.50 - 7.80 10^3/uL Final  . Lymphocytes 01/13/2023 1.00  1.00 - 3.60 10^3/uL Final  . Monocytes 01/13/2023 0.46  0.00 - 1.50 10^3/uL Final  . Eosinophils 01/13/2023 0.20  0.00 - 0.55 10^3/uL Final  . Basophils 01/13/2023 0.03  0.00 - 0.09 10^3/uL Final  . Neutrophil % 01/13/2023 75.7 (H)  32.0 - 70.0 % Final  . Lymphocyte % 01/13/2023 14.2  10.0 - 50.0 % Final  . Monocyte % 01/13/2023 6.5  4.0 - 13.0 % Final  . Eosinophil % 01/13/2023 2.8   1.0 - 5.0 % Final  . Basophil% 01/13/2023 0.4  0.0 - 2.0 % Final  . Immature Granulocyte % 01/13/2023 0.4  <=0.7 % Final  . Immature Granulocyte Count 01/13/2023 0.03  <=0.06 10^3/L Final  . Glucose 01/13/2023 92  70 - 110 mg/dL Final  . Sodium 94/68/7975 142  136 - 145 mmol/L Final  . Potassium 01/13/2023 4.1  3.6 - 5.1 mmol/L Final  . Chloride 01/13/2023 107  97 - 109 mmol/L Final  . Carbon Dioxide (CO2) 01/13/2023 24.1  22.0 - 32.0 mmol/L Final  . Urea Nitrogen (BUN) 01/13/2023 10  7 - 25 mg/dL Final  . Creatinine 94/68/7975 0.8  0.6 - 1.1 mg/dL Final  . Glomerular Filtration Rate (eGFR) 01/13/2023 79  >60 mL/min/1.73sq m Final  . Calcium  01/13/2023 8.6 (L)  8.7 - 10.3 mg/dL Final  . AST  94/68/7975 20  8 - 39 U/L Final  . ALT  01/13/2023 14  5 - 38 U/L Final  . Alk Phos (alkaline Phosphatase) 01/13/2023 131 (H)  34 - 104 U/L Final  . Albumin 01/13/2023 4.0  3.5 - 4.8 g/dL Final  . Bilirubin, Total 01/13/2023 0.5  0.3 - 1.2 mg/dL Final  . Protein, Total 01/13/2023 6.5  6.1 - 7.9 g/dL Final  . A/G Ratio 94/68/7975 1.6  1.0 - 5.0 gm/dL Final  . Thyroid Stimulating Hormone (TSH) 01/13/2023 1.166  0.450-5.330 uIU/ml uIU/mL Final  . Color 01/13/2023 Yellow  Colorless, Straw, Light Yellow, Yellow, Dark Yellow Final  . Clarity 01/13/2023 Clear  Clear Final  . Specific Gravity 01/13/2023 1.028  1.005 - 1.030 Final  . pH, Urine 01/13/2023 6.5  5.0 - 8.0 Final  . Protein, Urinalysis 01/13/2023 1+ (!)  Negative mg/dL Final  . Glucose, Urinalysis 01/13/2023 Negative  Negative mg/dL Final  . Ketones, Urinalysis 01/13/2023 Trace (!)  Negative mg/dL Final  . Blood, Urinalysis 01/13/2023 Negative  Negative Final  . Nitrite, Urinalysis 01/13/2023 Negative  Negative Final  . Leukocyte Esterase, Urinalysis 01/13/2023 Trace (!)  Negative Final  . Bilirubin, Urinalysis 01/13/2023 Negative  Negative Final  . Urobilinogen, Urinalysis 01/13/2023 2.0 (H)  0.2 - 1.0 mg/dL Final  . Mucous, Urine  01/13/2023 PRESENT (!)  None Seen Final  . WBC, UA 01/13/2023 6 (H)  <=5 /hpf Final  . Red Blood Cells, Urinalysis 01/13/2023 5 (H)  <=3 /hpf Final  . Bacteria, Urinalysis 01/13/2023 0-5  0 - 5 /hpf Final  . Squamous Epithelial Cells, Urinaly* 01/13/2023 1  /hpf Final  Office Visit on 12/13/2022  Component Date Value Ref Range Status  . Cholesterol, Total 12/13/2022 142  100 - 200 mg/dL Final  . Triglyceride 95/69/7975 165  35 - 199 mg/dL Final  . HDL (High Density Lipoprotein) Cho* 12/13/2022 50.7  35.0 - 85.0 mg/dL Final  . LDL Calculated 12/13/2022 58  0 - 130 mg/dL Final  . VLDL Cholesterol 12/13/2022 33  mg/dL Final  . Cholesterol/HDL Ratio 12/13/2022 2.8   Final  . WBC (White Blood Cell Count) 12/13/2022 5.8  4.1 - 10.2 10^3/uL Final  . RBC (Red Blood Cell Count) 12/13/2022 2.92 (L)  4.04 - 5.48 10^6/uL Final  . Hemoglobin 12/13/2022 9.3 (L)  12.0 - 15.0 gm/dL Final  . Hematocrit 95/69/7975 28.9 (L)  35.0 - 47.0 % Final  . MCV (Mean Corpuscular Volume) 12/13/2022 99.0  80.0 - 100.0 fl Final  . MCH (Mean Corpuscular Hemoglobin) 12/13/2022 31.8 (H)  27.0 - 31.2 pg Final  . MCHC (Mean Corpuscular Hemoglobin * 12/13/2022 32.2  32.0 - 36.0 gm/dL Final  . Platelet Count 12/13/2022 291  150 - 450 10^3/uL Final  . RDW-CV (Red Cell Distribution Widt* 12/13/2022 13.7  11.6 - 14.8 % Final  . MPV (Mean Platelet Volume) 12/13/2022 9.8  9.4 - 12.4 fl Final  . Neutrophils 12/13/2022 4.41  1.50 - 7.80 10^3/uL Final  . Lymphocytes 12/13/2022 0.69 (L)  1.00 - 3.60 10^3/uL Final  . Monocytes 12/13/2022 0.55  0.00 - 1.50 10^3/uL Final  . Eosinophils 12/13/2022 0.05  0.00 - 0.55 10^3/uL Final  . Basophils 12/13/2022 0.03  0.00 - 0.09 10^3/uL Final  . Neutrophil % 12/13/2022 76.7 (H)  32.0 - 70.0 % Final  . Lymphocyte % 12/13/2022 12.0  10.0 - 50.0 % Final  . Monocyte % 12/13/2022 9.6  4.0 - 13.0 % Final  . Eosinophil % 12/13/2022 0.9 (L)  1.0 - 5.0 % Final  . Basophil% 12/13/2022 0.5  0.0 - 2.0 %  Final  . Immature Granulocyte % 12/13/2022 0.3  <=0.7 % Final  . Immature Granulocyte Count 12/13/2022 0.02  <=0.06 10^3/L Final  . Glucose 12/13/2022 112 (H)  70 - 110 mg/dL Final  . Sodium 95/69/7975 141  136 - 145 mmol/L Final  .  Potassium 12/13/2022 3.4 (L)  3.6 - 5.1 mmol/L Final  . Chloride 12/13/2022 104  97 - 109 mmol/L Final  . Carbon Dioxide (CO2) 12/13/2022 29.6  22.0 - 32.0 mmol/L Final  . Urea Nitrogen (BUN) 12/13/2022 11  7 - 25 mg/dL Final  . Creatinine 95/69/7975 1.0  0.6 - 1.1 mg/dL Final  . Glomerular Filtration Rate (eGFR) 12/13/2022 61  >60 mL/min/1.73sq m Final  . Calcium  12/13/2022 8.8  8.7 - 10.3 mg/dL Final  . AST  95/69/7975 19  8 - 39 U/L Final  . ALT  12/13/2022 13  5 - 38 U/L Final  . Alk Phos (alkaline Phosphatase) 12/13/2022 81  34 - 104 U/L Final  . Albumin 12/13/2022 3.6  3.5 - 4.8 g/dL Final  . Bilirubin, Total 12/13/2022 0.3  0.3 - 1.2 mg/dL Final  . Protein, Total 12/13/2022 6.0 (L)  6.1 - 7.9 g/dL Final  . A/G Ratio 95/69/7975 1.5  1.0 - 5.0 gm/dL Final  . Thyroid Stimulating Hormone (TSH) 12/13/2022 0.939  0.450-5.330 uIU/ml uIU/mL Final  . Sedimentation Rate-Automated 12/13/2022 79 (H)  0 - 30 mm/hr Final  . Color 12/13/2022 Light Yellow  Colorless, Straw, Light Yellow, Yellow, Dark Yellow Final  . Clarity 12/13/2022 Clear  Clear Final  . Specific Gravity 12/13/2022 1.015  1.005 - 1.030 Final  . pH, Urine 12/13/2022 6.0  5.0 - 8.0 Final  . Protein, Urinalysis 12/13/2022 Negative  Negative mg/dL Final  . Glucose, Urinalysis 12/13/2022 Negative  Negative mg/dL Final  . Ketones, Urinalysis 12/13/2022 Negative  Negative mg/dL Final  . Blood, Urinalysis 12/13/2022 Negative  Negative Final  . Nitrite, Urinalysis 12/13/2022 Negative  Negative Final  . Leukocyte Esterase, Urinalysis 12/13/2022 Trace (!)  Negative Final  . Bilirubin, Urinalysis 12/13/2022 Negative  Negative Final  . Urobilinogen, Urinalysis 12/13/2022 0.2  0.2 - 1.0 mg/dL Final  . WBC,  UA 95/69/7975 1  <=5 /hpf Final  . Red Blood Cells, Urinalysis 12/13/2022 1  <=3 /hpf Final  . Bacteria, Urinalysis 12/13/2022 0-5  0 - 5 /hpf Final  . Squamous Epithelial Cells, Urinaly* 12/13/2022 0  /hpf Final  . Vitamin B12 12/13/2022 870  >300 pg/mL Final  . Hemoglobin A1C 12/13/2022 6.4 (H)  4.2 - 5.6 % Final  . Average Blood Glucose (Calc) 12/13/2022 137  mg/dL Final  . Ferritin 95/69/7975 679 (H)  11 - 307 ng/mL Final  . Iron 12/13/2022 28  28 - 170 ug/dL Final  . Total Iron Binding Capacity (TIBC) 12/13/2022 287.6  261.0 - 478.0 ug/dL Final  . Transferrin 95/69/7975 205.4  203.0 - 362.0 mg/dL Final  . % Saturation 95/69/7975 10  % Final  . Urine Culture, Routine - [facility] 12/13/2022 Final report   Final  . 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 (TP)  Reyes JONETTA Costa, MD, MD

## 2024-02-01 ENCOUNTER — Ambulatory Visit
Admission: RE | Admit: 2024-02-01 | Discharge: 2024-02-01 | Disposition: A | Discharge status: Home / Self Care | Source: Ambulatory Visit | Attending: Physician Assistant | Admitting: Physician Assistant

## 2024-02-01 ENCOUNTER — Other Ambulatory Visit: Payer: Self-pay | Admitting: Physician Assistant

## 2024-02-01 DIAGNOSIS — R2242 Localized swelling, mass and lump, left lower limb: Secondary | ICD-10-CM | POA: Diagnosis present

## 2024-02-22 NOTE — Progress Notes (Signed)
 Cardiology Office Note   Date:  02/23/2024  ID:  SHAINNA FAUX, DOB [birth year], MRN [id number] PCP: Auston Reyes BIRCH, MD   [facility] Providers Cardiologist:  Evalene Lunger, MD Cardiology APP:  Gerard Frederick, NP     History of Present Illness JAYLEENE GLAESER is a 73 y.o. 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   Previously prior CT of the chest for evaluation of previously possible calcification of the LAD complex, and RCA as well punctuated calcified granuloma of the right middle lobe, left upper left apical calcified granuloma, suspected distal bronchiectasis with mild airway plugging in the left lower lobe.  She had a Lexiscan MPI in 06/2020 which showed no evidence of scar or ischemia.  PFTs completed reportedly abnormal.  Chest contrast 2020 for syncope following back surgery.  There was significant electrolyte derangements noted with elevated D-dimer as well.  CTA of the chest was negative for PE.  Outpatient cardiac monitor showed normal rhythm with sinus arrhythmia heart rate 92 bpm, sinus tachycardia, and SVT at 40 beats.  Function continued shortness of breath worsening lower extremity edema.  In the setting send on echocardiogram 10/23 which showed EF of 60 to 65%, no RWMA, G1 DD, normal RV systolic function and ventricular cavity size, no significant valvular abnormalities.  She was admitted to the hospital from 4/23 - 12/08/2022 presenting from home with dizziness, weakness and left rib cage pain after having sustained a fall several days prior.  She was treated for severe sepsis presumed to be in the setting of UTI.  High-sensitivity troponin of 52 that trended up to 275.  BNP  102.  Lactic acid 3.6 trending to 3.0.  Chest x-ray showed chronic interstitial coarsening without acute cardiopulmonary process.  CT of the chest showed no evidence of PE but some enlargement of the main pulmonary artery along with dependent bandlike opacities in the bases favored to be atelectasis and a stable nodular infiltrative area in the right upper.  She was managed by the hospitalist service.  She followed up with her PCP on 12/13/2022 noted ongoing orthopedic discomfort from her fall as well as shortness of breath.  Chest x-ray was recommended.  She was referred to cardiology given dyspnea and elevated troponins noted during hospital admission.  She was seen in clinic 12/16/2022 accompanied by her husband.  Stated that she had been complaining of exertional dyspnea and fatigue for approximately a year.  She indicated that she could not go up a flight of stairs without having to stop and rest secondary to dyspnea.  With her ongoing symptoms of dyspnea and elevated troponins her hospitalization she was scheduled for left and right heart catheterization.  She underwent the procedure on 12/27/2022 which revealed mild nonobstructive coronary artery disease with 10-20% stenosis from calcified plaque in the proximal/mid LAD.  Otherwise no angiographically significant coronary artery disease.  Normal left and right heart filling pressures.  Normal Fick cardiac output/index.  She was recommended for medical therapy and risk factor modification to prevent progression of coronary artery disease and to seek alternative causes of shortness of breath.   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  She had no immediate postoperative complications.  She returns to clinic today  stating that from the cardiac perspective she has been doing well.  She denies any hospitalizations or visits to the emergency department.  Denies any chest pain or  shortness of breath.  She states she was recently diagnosed with obstructive sleep apnea placed on CPAP.  She also endorses left middle knee pain where she had seen [facility] and had x-rays completed and her knee is bone-on-bone.  She had the injections done that resolved and the pain send no edema replacement yet.  She also had a skin cancer removed from her left lower extremity.  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  States that she has been compliant with her current medication regimen without any undue side effects.  ROS: 10 point review of systems has been reviewed and considered negative except ones are listed in HPI  Studies Reviewed EKG Interpretation Date/Time:  Friday February 23 2024 08:35:48 EDT Ventricular Rate:  70 PR Interval:  174 QRS Duration:  74 QT Interval:  416 QTC Calculation: 449 R Axis:   30  Text Interpretation: Normal sinus rhythm Normal ECG When compared with ECG of 06-Dec-2022 06:50, PREVIOUS ECG IS PRESENT Confirmed by Gerard Frederick ([id number]) on 02/23/2024 8:39:25 AM    R/LHC 12/27/2022 Conclusions: Mild, non-obstructive coronary artery disease with 10-20% stenosis from calcified plaque in the proximal/mid LAD.  Otherwise, no angiographically significant coronary artery disease. Normal left and right heart filling pressures. Normal Fick cardiac output/index.   Recommendations: Medical therapy and risk factor modification to prevent progression of coronary artery disease. Consider evaluation for alternative causes of shortness of breath.  2D echo 05/27/2022 1. Left ventricular ejection fraction, by estimation, is 60 to 65%. The  left ventricle has normal function. The left ventricle has no regional  wall motion abnormalities. Left ventricular diastolic parameters are  consistent with Grade I diastolic  dysfunction (impaired relaxation).   2. Right ventricular systolic function is normal. The right ventricular  size is  normal.   3. The mitral valve is normal in structure. No evidence of mitral valve  regurgitation. No evidence of mitral stenosis.   4. The aortic valve is tricuspid. Aortic valve regurgitation is not  visualized. No aortic stenosis is present.   5. The inferior vena cava is normal in size with greater than 50%  respiratory variability, suggesting right atrial pressure of 3 mmHg.    Risk Assessment/Calculations           Physical Exam VS:  BP 118/60 (BP Location: Left Arm, Patient Position: Sitting, Cuff Size: Normal)   Pulse 70   Ht 5' 1 (1.549 m)   Wt 167 lb 6.4 oz (75.9 kg)   LMP  (LMP Unknown)   SpO2 97%   BMI 31.63 kg/m        Wt Readings from Last 3 Encounters:  02/23/24 167 lb 6.4 oz (75.9 kg)  06/26/23 168 lb (76.2 kg)  06/19/23 168 lb (76.2 kg)    GEN: Well nourished, well developed in no acute distress NECK: No JVD; No carotid bruits CARDIAC: RRR, no murmurs, rubs, gallops RESPIRATORY:  Clear to auscultation without rales, wheezing or rhonchi  ABDOMEN: Soft, non-tender, non-distended EXTREMITIES: Trace pretibial edema; CEAP III varicose veins bilaterally, no deformity   ASSESSMENT AND PLAN Nonobstructive coronary artery disease without symptoms of angina or anginal equivalents.  EKG today reveals sinus rhythm with a rate of 70 with no acute ischemic changes noted.  Last right and left heart catheterization  completed 12/27/2022 with mild nonobstructive coronary artery disease with 10-20% stenosis from calcified plaque in the proximal LAD.  Encouraged to continue with aspirin  81 mg daily.  Continues to work on cholesterol with last LDL being 83.  She has been intolerant to multiple statins in the past and is hesitant to try ezetimibe.  Mixed hyperlipidemia with last LDL of 83.  ASCVD 10-year risk is 15.6%.  Statin therapy is recommended.  Previously did not tolerate rosuvastatin .  Would recommend trialing ezetimibe 10 mg daily.  Ongoing management per PCP.  Primary  hypertension with blood pressure today 118/60.  Blood pressures remain stable.  She has been encouraged to continue to monitor pressures at home as well.  Type 2 diabetes with last hemoglobin A1c of 6.4.  Currently any diabetic medications.  Ongoing management per PCP.  Obstructive sleep apnea which she states she is compliant with CPAP.       Dispo: Patient return to clinic to see MD/APP in 11 to 12 months or sooner if needed for further evaluation.  Signed, Panhia Karl, NP

## 2024-02-23 ENCOUNTER — Encounter: Payer: Self-pay | Admitting: Cardiology

## 2024-02-23 ENCOUNTER — Ambulatory Visit: Attending: Cardiology | Admitting: Cardiology

## 2024-02-23 VITALS — BP 118/60 | HR 70 | Ht 61.0 in | Wt 167.4 lb

## 2024-02-23 DIAGNOSIS — I10 Essential (primary) hypertension: Secondary | ICD-10-CM | POA: Diagnosis not present

## 2024-02-23 DIAGNOSIS — G4733 Obstructive sleep apnea (adult) (pediatric): Secondary | ICD-10-CM | POA: Diagnosis not present

## 2024-02-23 DIAGNOSIS — I25118 Atherosclerotic heart disease of native coronary artery with other forms of angina pectoris: Secondary | ICD-10-CM | POA: Diagnosis not present

## 2024-02-23 DIAGNOSIS — E1140 Type 2 diabetes mellitus with diabetic neuropathy, unspecified: Secondary | ICD-10-CM

## 2024-02-23 NOTE — Patient Instructions (Signed)
 Medication Instructions:  Your physician recommends that you continue on your current medications as directed. Please refer to the Current Medication list given to you today.   *If you need a refill on your cardiac medications before your next appointment, please call your pharmacy*  Lab Work: No labs ordered today  If you have labs (blood work) drawn today and your tests are completely normal, you will receive your results only by: MyChart Message (if you have MyChart) OR A paper copy in the mail If you have any lab test that is abnormal or we need to change your treatment, we will call you to review the results.  Testing/Procedures: No test ordered today   Follow-Up: At [facility], you and your health needs are our priority.  As part of our continuing mission to provide you with exceptional heart care, our providers are all part of one team.  This team includes your primary Cardiologist (physician) and Advanced Practice Providers or APPs (Physician Assistants and Nurse Practitioners) who all work together to provide you with the care you need, when you need it.  Your next appointment:   12 month(s)  Provider:   Timothy Gollan, MD or Tylene Lunch, NP

## 2024-03-15 ENCOUNTER — Other Ambulatory Visit: Payer: Self-pay | Admitting: Internal Medicine

## 2024-03-15 DIAGNOSIS — Z0000 Encounter for general adult medical examination without abnormal findings: Secondary | ICD-10-CM

## 2024-03-15 DIAGNOSIS — R42 Dizziness and giddiness: Secondary | ICD-10-CM

## 2024-03-18 NOTE — Progress Notes (Unsigned)
 {  [id number]_[id number]}

## 2024-03-19 ENCOUNTER — Ambulatory Visit
Admission: RE | Admit: 2024-03-19 | Discharge: 2024-03-19 | Disposition: A | Discharge status: Home / Self Care | Source: Ambulatory Visit | Attending: Internal Medicine | Admitting: Internal Medicine

## 2024-03-19 DIAGNOSIS — R42 Dizziness and giddiness: Secondary | ICD-10-CM | POA: Diagnosis present

## 2024-03-19 DIAGNOSIS — Z0000 Encounter for general adult medical examination without abnormal findings: Secondary | ICD-10-CM | POA: Insufficient documentation

## 2024-06-17 ENCOUNTER — Other Ambulatory Visit: Payer: Self-pay | Admitting: Physician Assistant

## 2024-06-17 DIAGNOSIS — G629 Polyneuropathy, unspecified: Secondary | ICD-10-CM

## 2024-06-17 DIAGNOSIS — M48061 Spinal stenosis, lumbar region without neurogenic claudication: Secondary | ICD-10-CM

## 2024-06-17 NOTE — Therapy (Unsigned)
 OUTPATIENT OCCUPATIONAL THERAPY ORTHO EVALUATION  Patient Name: Nicole Bass MRN: [id number] DOB:05/29/1951, 73 y.o., female Today's Date: 06/18/2024  PCP: DR Auston REFERRING PROVIDER: Dr Edie  END OF SESSION:  OT End of Session - 06/18/24 1657     Visit Number 1    Number of Visits 12    Date for Recertification  08/13/24    OT Start Time 1115    OT Stop Time 1208    OT Time Calculation (min) 53 min    Activity Tolerance Patient tolerated treatment well    Behavior During Therapy [facility] for tasks assessed/performed          Past Medical History:  Diagnosis Date   Anxiety    CAD (coronary artery disease)    a.) R/LHC 12/27/2022: 15% p-mLAD --> med mgmt; mRA 6, mPA 19, mPCWP 10, AO sat 97%, PA sat 68%, CO 6.5, CI 3.7   Chronic dyspnea    Crohn's disease (HCC)    DDD (degenerative disc disease), cervical 2003   DDD (degenerative disc disease), lumbar    Depression    Diastolic dysfunction 05/27/2022   a.) TTE 05/27/2022: EF 60-65%, no RWMAs, G1DD, norm RVSF, no valvulopathies   Diet-controlled type 2 diabetes mellitus with peripheral neuropathy    Fatty liver    GERD (gastroesophageal reflux disease)    Hand weakness    left hand worse, small tremors   Histoplasmosis 2022   History of chicken pox    History of claustrophobia    History of kidney stones    History of shingles    HTN (hypertension)    Hyperthyroidism    IDA (iron deficiency anemia)    Iron deficiency anemia    Left-sided sensorineural hearing loss    Long term current use of immunosuppressive drug    a.) tofacitinib  for RA + prednisone    Long term current use of systemic steroids    a.) prednisone  for RA   Lung nodule    Lymphatic disorder    Macular hole of right eye    Meniere's disease    Mixed hyperlipidemia    Motion sickness    Nasal fracture 03/27/2020   due to fall   Neuropathy involving both lower extremities    OSA on CPAP    Osteoarthritis of both knees    Osteoporosis     Peripheral neuropathy    Pneumonia    Pyelonephritis    RA (rheumatoid arthritis) (HCC)    a.) Tx'd with tofacitinib  + prednisone    Restless leg syndrome    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HCC)    Venous insufficiency    a.) s/p superficial vein ablation 10/08/2021   Vitamin B 12 deficiency    Wears dentures    upper dentures   Past Surgical History:  Procedure Laterality Date   ABDOMINAL HYSTERECTOMY  2005   APPENDECTOMY     BACK SURGERY     x 3   CATARACT EXTRACTION W/ INTRAOCULAR LENS IMPLANT Right 07/06/2021   CATARACT EXTRACTION W/ INTRAOCULAR LENS IMPLANT Left 07/27/2021   CERVICAL SPINE SURGERY     metal plate   CESAREAN SECTION  1976 & 1989   X 2   CHOLECYSTECTOMY     CLOSED REDUCTION NASAL FRACTURE N/A 04/09/2020   Procedure: CLOSED REDUCTION NASAL FRACTURE;  Surgeon: Edda Mt, MD;  Location: [facility];  Service: ENT;  Laterality: N/A;   COLON SURGERY  1999   removed 18 inches of colon,removed appendix   COLONOSCOPY  1998, 2001, 2004, 2008, 2009, 2010, 2014, 2018   COLONOSCOPY WITH PROPOFOL  N/A 08/02/2021   Procedure: COLONOSCOPY WITH PROPOFOL ;  Surgeon: Maryruth Ole DASEN, MD;  Location: [facility];  Service: Endoscopy;  Laterality: N/A;   dental implants     3 teeth/ wears dentures   DILATION AND CURETTAGE OF UTERUS  2004   ELBOW FRACTURE SURGERY Left 2008   ESOPHAGOGASTRODUODENOSCOPY     1998, 2001, 2004, 2008, 2009, 2010, 2014   FIBEROPTIC BRONCHOSCOPY N/A 06/01/2020   Procedure: BEDSIDE BRONCHOSCOPY FIBEROPTIC;  Surgeon: Parris Manna, MD;  Location: [facility] OR[facility];  Service: Thoracic;  Laterality: N/A;   FRACTURE SURGERY     HALLUX VALGUS CORRECTION Right 12/27/2012   JOINT REPLACEMENT Right 2015   partial knee replacement   KYPHOPLASTY     KYPHOPLASTY N/A 04/14/2015   Procedure: KYPHOPLASTY;  Surgeon: Ozell Flake, MD;  Location: [facility];  Service: Orthopedics;  Laterality: N/A;   KYPHOPLASTY N/A 06/04/2015   Procedure: KYPHOPLASTY L-5;   Surgeon: Ozell Flake, MD;  Location: [facility];  Service: Orthopedics;  Laterality: N/A;   KYPHOPLASTY     LUMBAR LAMINECTOMY FOR EPIDURAL ABSCESS N/A 10/12/2020   Procedure: SYNOVIAL CYST RESECTION;  Surgeon: Bluford Standing, MD;  Location: [facility];  Service: Neurosurgery;  Laterality: N/A;   MAXIMUM ACCESS (MAS) TRANSFORAMINAL LUMBAR INTERBODY FUSION (TLIF) 1 LEVEL Left 10/12/2020   Procedure: OPEN L5/S1 TRANSFORAMINAL LUMBAR INTERBODY FUSION (TLIF) 1 LEVEL;  Surgeon: Bluford Standing, MD;  Location: [facility];  Service: Neurosurgery;  Laterality: Left;   MEDIAL PARTIAL KNEE REPLACEMENT Right 2015   NECK/PLATE  7997   OOPHORECTOMY Bilateral    ORIF NASAL FRACTURE N/A 04/09/2020   Procedure: OPEN REDUCTION INTERNAL FIXATION (ORIF) NASAL FRACTURE;  Surgeon: Edda Mt, MD;  Location: [facility];  Service: ENT;  Laterality: N/A;   PARS PLANA VITRECTOMY Right 09/08/2022   REPAIR EXTENSOR TENDON Left 01/08/2019   Procedure: Realignment  EXTENSOR TENDON;  Surgeon: Flake Ozell, MD;  Location: [facility] OR[facility];  Service: Orthopedics;  Laterality: Left;   REVERSE SHOULDER ARTHROPLASTY Right 04/18/2019   Procedure: REVERSE SHOULDER ARTHROPLASTY;  Surgeon: Edie Norleen PARAS, MD;  Location: [facility];  Service: Orthopedics;  Laterality: Right;   RIGHT/LEFT HEART CATH AND CORONARY ANGIOGRAPHY Bilateral 12/27/2022   Procedure: RIGHT/LEFT HEART CATH AND CORONARY ANGIOGRAPHY;  Surgeon: Mady Bruckner, MD;  Location: [facility];  Service: Cardiovascular;  Laterality: Bilateral;   SHOULDER ARTHROSCOPY WITH ROTATOR CUFF REPAIR AND SUBACROMIAL DECOMPRESSION Right 04/13/2016   Procedure: SHOULDER ARTHROSCOPY WITH ROTATOR CUFF REPAIR AND SUBACROMIAL DECOMPRESSION, release of long head biceps tendon;  Surgeon: Helayne Glenn, MD;  Location: [facility];  Service: Orthopedics;  Laterality: Right;   TOE SURGERY Right 2013   first MTP osteotomy   TONSILLECTOMY  1995   UPPER GI ENDOSCOPY     WRIST FRACTURE  SURGERY Bilateral 2016   Patient Active Problem List   Diagnosis Date Noted   At high risk for injury related to fall 01/05/2023   Bronchiectasis (HCC) 01/05/2023   Hypoxia 01/05/2023   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   Volume depletion 01/05/2023   Weakness 12/07/2022   Elevated troponin 12/07/2022   Sepsis secondary to UTI (HCC) 12/07/2022   Near syncope 12/07/2022   Hypotension 12/06/2022   HTN (hypertension) 12/06/2022   Severe sepsis (HCC) 12/06/2022   UTI (urinary tract infection) 12/06/2022   Left wrist fracture 12/06/2022   RA (rheumatoid arthritis) (HCC) 12/06/2022   Myocardial injury 12/06/2022   Acute renal failure superimposed on  stage 3a chronic kidney disease (HCC) 12/06/2022   Hypophosphatemia 12/06/2022   Lung nodule 12/06/2022   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   Diabetes mellitus type 2 without retinopathy (HCC) 08/03/2022   Epiretinal membrane (ERM) of right eye 08/03/2022   Pseudophakia of both eyes 08/03/2022   Vitreomacular adhesion of left eye 08/03/2022   Vitreomacular traction syndrome of both eyes 08/03/2022   AKI (acute kidney injury) 06/03/2022   Acute hypoxemic respiratory failure (HCC) 06/03/2022   Gastroesophageal reflux disease without esophagitis 05/11/2022   Macular cyst, hole, or pseudohole, right eye 05/11/2022   OSA (obstructive sleep apnea) 05/11/2022   Varicose veins of both lower extremities with pain 04/19/2022   Lymphedema 04/19/2022   Mixed hyperlipidemia 02/23/2022   Rotator cuff tendinitis, left 12/04/2020   Syncope 11/09/2020   Normocytic anemia 11/09/2020   Hypomagnesemia 11/09/2020   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 11/09/2020   Chronic diastolic CHF (congestive heart failure) (HCC) 11/09/2020   Cryptococcus (HCC) 10/25/2020   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   Fusion of lumbar spine 10/17/2020   Lumbar stenosis 10/12/2020   Swelling of limb 06/02/2020   Cervical spondylosis  05/01/2020   Atherosclerosis of native coronary artery of native heart without angina pectoris 04/27/2020   Numbness and tingling in left hand 10/29/2019   Status post reverse total shoulder replacement, 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   Status post right partial knee replacement 02/12/2019   Rotator cuff arthropathy, right 01/25/2019   Iron deficiency anemia 09/13/2018   Anemia, unspecified 09/09/2018   Iron deficiency 09/04/2018   Acute kidney injury 08/23/2018   HTN, goal below 140/80 06/04/2018   Hyperthyroidism 02/08/2018   Imbalance 12/18/2017   Stomatitis, ulcerative 09/06/2017   Dyspareunia in female 08/22/2017   Vaginal atrophy 08/22/2017   Obesity (BMI 30.0-34.9) 08/22/2017   Varicose veins of leg with pain, left 04/05/2017   Peripheral polyneuropathy 04/05/2017   Lumbar radiculopathy 04/05/2017   Tremor 02/13/2017   Sensory neuropathy 02/13/2017   Restless leg syndrome 02/13/2017   Impingement syndrome of left shoulder 09/29/2016   Acute diarrhea 09/06/2016   History of pyelonephritis 09/06/2016   Hypokalemia 09/06/2016   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   Pyelonephritis 08/29/2016   Rheumatoid arthritis, unspecified (HCC) 08/29/2016   Nontraumatic complete tear of right rotator cuff 03/08/2016   Acute pain of right shoulder 01/27/2016   Type 2 diabetes mellitus with diabetic neuropathy (HCC) 10/27/2015   Closed fracture of distal end of right radius with routine healing 09/17/2015   Crohn's colitis (HCC) 09/04/2015   Acute bilateral low back pain without sciatica 03/23/2015   Dyspnea 01/15/2015   Pedal edema 01/15/2015   Status post open reduction with internal fixation of fracture 11/27/2014   Aftercare for healing traumatic fracture of lower arm 09/25/2014   Painful rib 09/11/2014   Primary osteoarthritis of left knee 04/24/2014   Osteoporosis 03/17/2014   Depression with anxiety 03/17/2014   B12 deficiency 03/17/2014   Crohn's disease, unspecified, without  complications (HCC) 03/17/2014   Urolith 07/16/2013   Right flank pain 07/16/2013   Abdominal pain 07/16/2013    ONSET DATE: 04/28/24  REFERRING DIAG: Nondisplaced fracture of left fifth digit base of metacarpal and left wrist sprain.  THERAPY DIAG:  Muscle weakness (generalized)  Stiffness of left hand, not elsewhere classified  Pain in left hand  Pain in left wrist  Rationale for Evaluation and Treatment: Rehabilitation  SUBJECTIVE:   SUBJECTIVE STATEMENT: [place]pped at the beach and  Pt accompanied by:  self  PERTINENT HISTORY:  Dr Edie 04/29/24: The left wrist/hand x-rays demonstrated a nondisplaced fracture involving the base of the fifth metacarpal, as well as postsurgical changes resulting from a prior ORIF of a left distal radius fracture performed by Dr. Francisco several years ago. The patient was placed into an ulnar gutter splint and advised to follow-up with orthopedics.   Dr Edie 05/27/24: SHONIKA KOLASINSKI is a 73 y.o. female who presents for follow-up of a nondisplaced extra-articular fracture of the left fifth metacarpal, as well as left wrist sprain and left shoulder sprain injuries incurred as a result of a fall 1 month ago. On today's visit, she rates her pain at 2/10 in both the left wrist/hand as well as in the left shoulder.  She does note some residual weakness and discomfort in the wrist, but has already began resuming her normal daily activities. She will be referred to occupational therapy for progressive range of motion and strength exercises in order to optimize her overall function. Regarding her right shoulder symptoms, she does not feel that they are sufficiently severe as to require more aggressive treatment for the nonunited scapular spine fracture. I have recommended that she continue with her normal daily activities and home exercises, but to avoid offending activities. She may take over-the-counter medications as needed for discomfort. All of the patient's  questions and concerns were answered. She can call any time with further concerns. She will follow up in 6 to 8 weeks for re-evaluation   PRECAUTIONS: None  RED FLAGS: None   WEIGHT BEARING RESTRICTIONS: No  PAIN:  Are you having pain?  Left ulnar wrist and hypothenar eminence of hand/5/10  FALLS: Has patient fallen in last 6 months? Yes. Number of falls 1  LIVING ENVIRONMENT: Lives with: lives with their family and lives with their spouse  PLOF: Independent Difficulty with IADLs (cutting, picking up items, and turning pages) loves to do scrapbook , also doing her church bulletin  board and pictures   PATIENT GOALS: I want my pinkie straight and more strength in my hand and wrist   NEXT MD VISIT: ?  OBJECTIVE:  Note: Objective measures were completed at Evaluation unless otherwise noted.  HAND DOMINANCE: Right  ADLs: WFL  FUNCTIONAL OUTCOME MEASURES: PRWHE pain and function  UPPER EXTREMITY ROM:     Active ROM Right eval Left eval  Shoulder flexion    Shoulder abduction    Shoulder adduction    Shoulder extension    Shoulder internal rotation    Shoulder external rotation    Elbow flexion    Elbow extension    Wrist flexion  48  Wrist extension  70  Wrist ulnar deviation  30  Wrist radial deviation  20  Wrist pronation  90  Wrist supination  90  (Blank rows = not tested) Pain with resisted radial deviation on ulnar wrist  Active ROM Right eval Left eval  Thumb MCP (0-60)    Thumb IP (0-80)    Thumb Radial abd/add (0-55)     Thumb Palmar abd/add (0-45)     Thumb Opposition to Small Finger     Index MCP (0-90)     Index PIP (0-100)     Index DIP (0-70)      Long MCP (0-90)      Long PIP (0-100)      Long DIP (0-70)      Ring MCP (0-90)      Ring PIP (0-100)  Ring DIP (0-70)      Little MCP (0-90)    hyperextension  Little PIP (0-100)   -60  ext  Little DIP (0-70)      (Blank rows = not tested)   HAND FUNCTION: Grip strength: Right: 32  lbs; Left: 15 with pain  lbs, Lateral pinch: Right: 4 lbs, Left: 2 lbs, and 3 point pinch: Right: 3 lbs, Left: 2 middle finger only lbs  COORDINATION: Impaired - will assess next session  SENSATION: WFL  EDEMA: none  COGNITION: Overall cognitive status: Within functional limits for tasks assessed      TREATMENT DATE: 06/18/24                                                                                                                            HEP pt to do 3 x day - 10 reps each  2 lbs Red resistive clothespin for lateral pinch and 3 point pinch with buddy strap on 3rd and 2nd Middle phalanges on  L hand,  4 lbs Green resistive clothespin for lateral pinch and 3 point pinch on  R hand.  Provided buddy strap  During assessment when [facility] of fifth digit is blocked in neutral patient has increased PIP extension actively.  To -30 degrees Attempted to do RMS splint for 1st through 4th.  But fifth abduction strong and pulling fourth and hyperextension as well as abduction Modified twice.  At the end hold of will reassess and fabricate next session splint to include third digit for stability Patient can use buddy strap 2nd-3rd middle phalanges to maintain second digit in flexion during lateral and 3-point pinch given her functional pinch But also can put it on proximal phalanges 5th and 4th for adduction of the fifth and facilitating less hyperextension at the fifth MCP To be used on and off during the day Fabricated custom extension gutter splint for left fourth PIP for nighttime and 0 degrees extension. Patient was educated on donning and doffing as well as wearing of extension gutter splint.  Patient verbalized and demonstrated understanding.    PATIENT EDUCATION: Education details: findings of eval and HEP  Person educated: Patient Education method: Explanation, Demonstration, Tactile cues, Verbal cues, and Handouts Education comprehension: verbalized understanding, returned  demonstration, verbal cues required, and needs further education    GOALS: Goals reviewed with patient? Yes  SHORT TERM GOALS: Target date: 2 wks   Patient to be independent in wearing as well as donning and doffing nighttime extension gutter splint for fourth PIP as well as RMS for daytime use to facilitate decrease hyperextension of fifth MC and increase extension facilitation at PIP to less than -30 degrees. Baseline: At rest PIP extension of the fifth on the left -60 with hyperextension of the MCP.;  Attempt to fabricate RMS but patient continued to do adduction of 4th and 5th as well as fifth putting forth into hyperextension.  Held will include next session 3rd through 5th and  splint.  Patient was fitted with a buddy strap for 2nd and 4th third middle phalanges and PIP extension gutter splint for nighttime. Goal status: INITIAL      LONG TERM GOALS: Target date: 8 weeks  Left wrist active range of motion strength in all range improved to within normal limits for patient to push and pull door and turn doorknob symptom-free Baseline: Trouble with turning a doorknob using fifth digit as well as unable to push and pull heavy door because of decreased wrist flexion and pain and discomfort at wrist Goal status: INITIAL  2.  Left fifth digit extension improve to less than -15 degrees of PIP extension for patient to be able to put hand in pocket and donning gloves Baseline: Patient rife with hyperextension of the fifth metacarpal and extension of PIP -60 degrees Goal status: INITIAL  3.  Adduction of left 4th and 5th digit increase from patient to maintain adduction to be able to put hand in pocket and donning gloves symptom-free Baseline: Fifth digit rests in hyperextension at the MCP 60 degrees PIP flexion and abduction.  Compensate with flexion to adduct as well as pulling fourth digit and abduction. Goal status: INITIAL  4.  Bilateral lateral 3-point pinch improve with 1 to 3 pounds  for patient to be able to open Ziploc bags, carry plate and do buttons but more ease Baseline: Grip strength: Right: 32 lbs; Left: 15 with pain  lbs, Lateral pinch: Right: 4 lbs, Left: 2 lbs, and 3 point pinch: Right: 3 lbs, Left: 2 middle finger only lbs Goal status: INITIAL  5.  Grip strength improved in left hand with more than 5 to 8 pounds for patient to be able to carry half a gallon and left hand symptom-free Baseline: Right grip 32 pounds left 15 pounds with pain on the lateral ulnar side of the hand Goal status: INITIAL   ASSESSMENT:  CLINICAL IMPRESSION: Patient seen today for occupational therapy evaluation for left nondisplaced fracture of fifth base of metacarpal as well as left wrist sprain.  Injury occurred with a fall around 04/29/2024.  Patient was immobilize in ulnar gutter splint.  Patient present at evaluation with decreased wrist flexion more than extension.  Pain at ulnar wrist.  Patient with hyperextension of the fifth metacarpal and PIP extension lacking -60 degrees.  Patient with decreased strength and digit extension as well as grip and prehension strength.  Patient pain on ulnar side of hand and wrist increased to 4-5/10 pain.  Patient limited in functional use of left hand in ADLs and IADLs.  Patient has old radius fracture on the left.  As well as inability to flex second digit at PIP DIP during grasping or lateral pinch or 3-point pinch.  Patient can benefit from skilled OT services to decrease pain increase motion, decrease hyperextension of the fifth metacarpal increase PIP extension-fabrication of appropriate splints to facilitate correct motion and decrease risk for contractures.  Increasing strength for patient to return to prior level of function or be independent in use of left hand in ADLs and IADLs.  PERFORMANCE DEFICITS: in functional skills including ADLs, IADLs, ROM, strength, pain, flexibility, decreased knowledge of use of DME, and UE functional use,   and  psychosocial skills including environmental adaptation and routines and behaviors.   IMPAIRMENTS: are limiting patient from ADLs, IADLs, rest and sleep, play, leisure, and social participation.   COMORBIDITIES: has no other co-morbidities that affects occupational performance. Patient will benefit from skilled OT to address above  impairments and improve overall function.  MODIFICATION OR ASSISTANCE TO COMPLETE EVALUATION: No modification of tasks or assist necessary to complete an evaluation.  OT OCCUPATIONAL PROFILE AND HISTORY: Problem focused assessment: Including review of records relating to presenting problem.  CLINICAL DECISION MAKING: LOW - limited treatment options, no task modification necessary  REHAB POTENTIAL: Good for goals  EVALUATION COMPLEXITY: Low      PLAN:  OT FREQUENCY: 1-2x/week  OT DURATION: 8 weeks  PLANNED INTERVENTIONS: 97168 OT Re-evaluation, 97535 self care/ADL training, 02889 therapeutic exercise, 97530 therapeutic activity, 97112 neuromuscular re-education, 97140 manual therapy, 97035 ultrasound, 97018 paraffin, 02960 fluidotherapy, 97034 contrast bath, 97760 Orthotic Initial, [id number] Orthotic/Prosthetic subsequent, passive range of motion, patient/family education, and DME and/or AE instructions    CONSULTED AND AGREED WITH PLAN OF CARE: Patient     Ancel Peters, OTR/L,CLT 06/18/2024, 5:17 PM

## 2024-06-18 ENCOUNTER — Ambulatory Visit: Attending: Surgery | Admitting: Occupational Therapy

## 2024-06-18 DIAGNOSIS — M79642 Pain in left hand: Secondary | ICD-10-CM | POA: Diagnosis present

## 2024-06-18 DIAGNOSIS — M25642 Stiffness of left hand, not elsewhere classified: Secondary | ICD-10-CM | POA: Diagnosis present

## 2024-06-18 DIAGNOSIS — M6281 Muscle weakness (generalized): Secondary | ICD-10-CM | POA: Insufficient documentation

## 2024-06-18 DIAGNOSIS — M25532 Pain in left wrist: Secondary | ICD-10-CM | POA: Diagnosis present

## 2024-06-20 ENCOUNTER — Ambulatory Visit: Admitting: Occupational Therapy

## 2024-06-21 ENCOUNTER — Encounter: Payer: Self-pay | Admitting: Occupational Therapy

## 2024-06-21 ENCOUNTER — Ambulatory Visit: Admitting: Occupational Therapy

## 2024-06-21 DIAGNOSIS — M25642 Stiffness of left hand, not elsewhere classified: Secondary | ICD-10-CM

## 2024-06-21 DIAGNOSIS — M6281 Muscle weakness (generalized): Secondary | ICD-10-CM

## 2024-06-21 NOTE — Therapy (Signed)
 OUTPATIENT OCCUPATIONAL THERAPY ORTHO TREATMENT  Patient Name: Nicole Bass MRN: [id number] DOB:Oct 16, 1950, 73 y.o., female Today's Date: 06/21/2024  PCP: DR Auston REFERRING PROVIDER: Dr Edie  END OF SESSION:  OT End of Session - 06/21/24 1041     Visit Number 2    Number of Visits 12    Date for Recertification  08/13/24    OT Start Time 1035    OT Stop Time 1120    OT Time Calculation (min) 45 min    Activity Tolerance Patient tolerated treatment well    Behavior During Therapy [facility] for tasks assessed/performed           Past Medical History:  Diagnosis Date   Anxiety    CAD (coronary artery disease)    a.) R/LHC 12/27/2022: 15% p-mLAD --> med mgmt; mRA 6, mPA 19, mPCWP 10, AO sat 97%, PA sat 68%, CO 6.5, CI 3.7   Chronic dyspnea    Crohn's disease (HCC)    DDD (degenerative disc disease), cervical 2003   DDD (degenerative disc disease), lumbar    Depression    Diastolic dysfunction 05/27/2022   a.) TTE 05/27/2022: EF 60-65%, no RWMAs, G1DD, norm RVSF, no valvulopathies   Diet-controlled type 2 diabetes mellitus with peripheral neuropathy    Fatty liver    GERD (gastroesophageal reflux disease)    Hand weakness    left hand worse, small tremors   Histoplasmosis 2022   History of chicken pox    History of claustrophobia    History of kidney stones    History of shingles    HTN (hypertension)    Hyperthyroidism    IDA (iron deficiency anemia)    Iron deficiency anemia    Left-sided sensorineural hearing loss    Long term current use of immunosuppressive drug    a.) tofacitinib  for RA + prednisone    Long term current use of systemic steroids    a.) prednisone  for RA   Lung nodule    Lymphatic disorder    Macular hole of right eye    Meniere's disease    Mixed hyperlipidemia    Motion sickness    Nasal fracture 03/27/2020   due to fall   Neuropathy involving both lower extremities    OSA on CPAP    Osteoarthritis of both knees    Osteoporosis     Peripheral neuropathy    Pneumonia    Pyelonephritis    RA (rheumatoid arthritis) (HCC)    a.) Tx'd with tofacitinib  + prednisone    Restless leg syndrome    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HCC)    Venous insufficiency    a.) s/p superficial vein ablation 10/08/2021   Vitamin B 12 deficiency    Wears dentures    upper dentures   Past Surgical History:  Procedure Laterality Date   ABDOMINAL HYSTERECTOMY  2005   APPENDECTOMY     BACK SURGERY     x 3   CATARACT EXTRACTION W/ INTRAOCULAR LENS IMPLANT Right 07/06/2021   CATARACT EXTRACTION W/ INTRAOCULAR LENS IMPLANT Left 07/27/2021   CERVICAL SPINE SURGERY     metal plate   CESAREAN SECTION  1976 & 1989   X 2   CHOLECYSTECTOMY     CLOSED REDUCTION NASAL FRACTURE N/A 04/09/2020   Procedure: CLOSED REDUCTION NASAL FRACTURE;  Surgeon: Edda Mt, MD;  Location: [facility];  Service: ENT;  Laterality: N/A;   COLON SURGERY  1999   removed 18 inches of colon,removed appendix   COLONOSCOPY  1998, 2001, 2004, 2008, 2009, 2010, 2014, 2018   COLONOSCOPY WITH PROPOFOL  N/A 08/02/2021   Procedure: COLONOSCOPY WITH PROPOFOL ;  Surgeon: Maryruth Ole DASEN, MD;  Location: [facility] ENDOSCOPY;  Service: Endoscopy;  Laterality: N/A;   dental implants     3 teeth/ wears dentures   DILATION AND CURETTAGE OF UTERUS  2004   ELBOW FRACTURE SURGERY Left 2008   ESOPHAGOGASTRODUODENOSCOPY     1998, 2001, 2004, 2008, 2009, 2010, 2014   FIBEROPTIC BRONCHOSCOPY N/A 06/01/2020   Procedure: BEDSIDE BRONCHOSCOPY FIBEROPTIC;  Surgeon: Parris Manna, MD;  Location: [facility] ORS;  Service: Thoracic;  Laterality: N/A;   FRACTURE SURGERY     HALLUX VALGUS CORRECTION Right 12/27/2012   JOINT REPLACEMENT Right 2015   partial knee replacement   KYPHOPLASTY     KYPHOPLASTY N/A 04/14/2015   Procedure: KYPHOPLASTY;  Surgeon: Ozell Flake, MD;  Location: [facility] OR[facility];  Service: Orthopedics;  Laterality: N/A;   KYPHOPLASTY N/A 06/04/2015   Procedure: KYPHOPLASTY  L-5;  Surgeon: Ozell Flake, MD;  Location: [facility] OR[facility];  Service: Orthopedics;  Laterality: N/A;   KYPHOPLASTY     LUMBAR LAMINECTOMY FOR EPIDURAL ABSCESS N/A 10/12/2020   Procedure: SYNOVIAL CYST RESECTION;  Surgeon: Bluford Standing, MD;  Location: [facility];  Service: Neurosurgery;  Laterality: N/A;   MAXIMUM ACCESS (MAS) TRANSFORAMINAL LUMBAR INTERBODY FUSION (TLIF) 1 LEVEL Left 10/12/2020   Procedure: OPEN L5/S1 TRANSFORAMINAL LUMBAR INTERBODY FUSION (TLIF) 1 LEVEL;  Surgeon: Bluford Standing, MD;  Location: [facility];  Service: Neurosurgery;  Laterality: Left;   MEDIAL PARTIAL KNEE REPLACEMENT Right 2015   NECK/PLATE  7997   OOPHORECTOMY Bilateral    ORIF NASAL FRACTURE N/A 04/09/2020   Procedure: OPEN REDUCTION INTERNAL FIXATION (ORIF) NASAL FRACTURE;  Surgeon: Edda Mt, MD;  Location: [facility];  Service: ENT;  Laterality: N/A;   PARS PLANA VITRECTOMY Right 09/08/2022   REPAIR EXTENSOR TENDON Left 01/08/2019   Procedure: Realignment  EXTENSOR TENDON;  Surgeon: Flake Ozell, MD;  Location: [facility] OR[facility];  Service: Orthopedics;  Laterality: Left;   REVERSE SHOULDER ARTHROPLASTY Right 04/18/2019   Procedure: REVERSE SHOULDER ARTHROPLASTY;  Surgeon: Edie Norleen PARAS, MD;  Location: [facility];  Service: Orthopedics;  Laterality: Right;   RIGHT/LEFT HEART CATH AND CORONARY ANGIOGRAPHY Bilateral 12/27/2022   Procedure: RIGHT/LEFT HEART CATH AND CORONARY ANGIOGRAPHY;  Surgeon: Mady Bruckner, MD;  Location: [facility];  Service: Cardiovascular;  Laterality: Bilateral;   SHOULDER ARTHROSCOPY WITH ROTATOR CUFF REPAIR AND SUBACROMIAL DECOMPRESSION Right 04/13/2016   Procedure: SHOULDER ARTHROSCOPY WITH ROTATOR CUFF REPAIR AND SUBACROMIAL DECOMPRESSION, release of long head biceps tendon;  Surgeon: Helayne Glenn, MD;  Location: [facility];  Service: Orthopedics;  Laterality: Right;   TOE SURGERY Right 2013   first MTP osteotomy   TONSILLECTOMY  1995   UPPER GI ENDOSCOPY     WRIST FRACTURE  SURGERY Bilateral 2016   Patient Active Problem List   Diagnosis Date Noted   At high risk for injury related to fall 01/05/2023   Bronchiectasis (HCC) 01/05/2023   Hypoxia 01/05/2023   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   Volume depletion 01/05/2023   Weakness 12/07/2022   Elevated troponin 12/07/2022   Sepsis secondary to UTI (HCC) 12/07/2022   Near syncope 12/07/2022   Hypotension 12/06/2022   HTN (hypertension) 12/06/2022   Severe sepsis (HCC) 12/06/2022   UTI (urinary tract infection) 12/06/2022   Left wrist fracture 12/06/2022   RA (rheumatoid arthritis) (HCC) 12/06/2022   Myocardial injury 12/06/2022   Acute renal failure superimposed on  stage 3a chronic kidney disease (HCC) 12/06/2022   Hypophosphatemia 12/06/2022   Lung nodule 12/06/2022   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   Diabetes mellitus type 2 without retinopathy (HCC) 08/03/2022   Epiretinal membrane (ERM) of right eye 08/03/2022   Pseudophakia of both eyes 08/03/2022   Vitreomacular adhesion of left eye 08/03/2022   Vitreomacular traction syndrome of both eyes 08/03/2022   AKI (acute kidney injury) 06/03/2022   Acute hypoxemic respiratory failure (HCC) 06/03/2022   Gastroesophageal reflux disease without esophagitis 05/11/2022   Macular cyst, hole, or pseudohole, right eye 05/11/2022   OSA (obstructive sleep apnea) 05/11/2022   Varicose veins of both lower extremities with pain 04/19/2022   Lymphedema 04/19/2022   Mixed hyperlipidemia 02/23/2022   Rotator cuff tendinitis, left 12/04/2020   Syncope 11/09/2020   Normocytic anemia 11/09/2020   Hypomagnesemia 11/09/2020   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 11/09/2020   Chronic diastolic CHF (congestive heart failure) (HCC) 11/09/2020   Cryptococcus (HCC) 10/25/2020   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   Fusion of lumbar spine 10/17/2020   Lumbar stenosis 10/12/2020   Swelling of limb 06/02/2020   Cervical spondylosis  05/01/2020   Atherosclerosis of native coronary artery of native heart without angina pectoris 04/27/2020   Numbness and tingling in left hand 10/29/2019   Status post reverse total shoulder replacement, 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   Status post right partial knee replacement 02/12/2019   Rotator cuff arthropathy, right 01/25/2019   Iron deficiency anemia 09/13/2018   Anemia, unspecified 09/09/2018   Iron deficiency 09/04/2018   Acute kidney injury 08/23/2018   HTN, goal below 140/80 06/04/2018   Hyperthyroidism 02/08/2018   Imbalance 12/18/2017   Stomatitis, ulcerative 09/06/2017   Dyspareunia in female 08/22/2017   Vaginal atrophy 08/22/2017   Obesity (BMI 30.0-34.9) 08/22/2017   Varicose veins of leg with pain, left 04/05/2017   Peripheral polyneuropathy 04/05/2017   Lumbar radiculopathy 04/05/2017   Tremor 02/13/2017   Sensory neuropathy 02/13/2017   Restless leg syndrome 02/13/2017   Impingement syndrome of left shoulder 09/29/2016   Acute diarrhea 09/06/2016   History of pyelonephritis 09/06/2016   Hypokalemia 09/06/2016   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   Pyelonephritis 08/29/2016   Rheumatoid arthritis, unspecified (HCC) 08/29/2016   Nontraumatic complete tear of right rotator cuff 03/08/2016   Acute pain of right shoulder 01/27/2016   Type 2 diabetes mellitus with diabetic neuropathy (HCC) 10/27/2015   Closed fracture of distal end of right radius with routine healing 09/17/2015   Crohn's colitis (HCC) 09/04/2015   Acute bilateral low back pain without sciatica 03/23/2015   Dyspnea 01/15/2015   Pedal edema 01/15/2015   Status post open reduction with internal fixation of fracture 11/27/2014   Aftercare for healing traumatic fracture of lower arm 09/25/2014   Painful rib 09/11/2014   Primary osteoarthritis of left knee 04/24/2014   Osteoporosis 03/17/2014   Depression with anxiety 03/17/2014   B12 deficiency 03/17/2014   Crohn's disease, unspecified, without  complications (HCC) 03/17/2014   Urolith 07/16/2013   Right flank pain 07/16/2013   Abdominal pain 07/16/2013    ONSET DATE: 04/28/24  REFERRING DIAG: Nondisplaced fracture of left fifth digit base of metacarpal and left wrist sprain.  THERAPY DIAG:  Muscle weakness (generalized)  Stiffness of left hand, not elsewhere classified  Rationale for Evaluation and Treatment: Rehabilitation  SUBJECTIVE:   SUBJECTIVE STATEMENT: I havent been able to use the splint or buddy strap.  Pt accompanied by: self  PERTINENT HISTORY:  Dr Edie 04/29/24: The left wrist/hand x-rays demonstrated a nondisplaced fracture involving the base of the fifth metacarpal, as well as postsurgical changes resulting from a prior ORIF of a left distal radius fracture performed by Dr. Francisco several years ago. The patient was placed into an ulnar gutter splint and advised to follow-up with orthopedics.   Dr Edie 05/27/24: Nicole Bass is a 73 y.o. female who presents for follow-up of a nondisplaced extra-articular fracture of the left fifth metacarpal, as well as left wrist sprain and left shoulder sprain injuries incurred as a result of a fall 1 month ago. On today's visit, she rates her pain at 2/10 in both the left wrist/hand as well as in the left shoulder.  She does note some residual weakness and discomfort in the wrist, but has already began resuming her normal daily activities. She will be referred to occupational therapy for progressive range of motion and strength exercises in order to optimize her overall function. Regarding her right shoulder symptoms, she does not feel that they are sufficiently severe as to require more aggressive treatment for the nonunited scapular spine fracture. I have recommended that she continue with her normal daily activities and home exercises, but to avoid offending activities. She may take over-the-counter medications as needed for discomfort. All of the patient's questions and  concerns were answered. She can call any time with further concerns. She will follow up in 6 to 8 weeks for re-evaluation   PRECAUTIONS: None  RED FLAGS: None   WEIGHT BEARING RESTRICTIONS: No  PAIN:  Are you having pain?  Left ulnar wrist and hypothenar eminence of hand 5/10  FALLS: Has patient fallen in last 6 months? Yes. Number of falls 1  LIVING ENVIRONMENT: Lives with: lives with their family and lives with their spouse  PLOF: Independent Difficulty with IADLs (cutting, picking up items, and turning pages) loves to do scrapbook , also doing her church bulletin  board and pictures   PATIENT GOALS: I want my pinkie straight and more strength in my hand and wrist   NEXT MD VISIT: ?  OBJECTIVE:  Note: Objective measures were completed at Evaluation unless otherwise noted.  HAND DOMINANCE: Right  ADLs: WFL  FUNCTIONAL OUTCOME MEASURES: PRWHE pain and function  UPPER EXTREMITY ROM:     Active ROM Right eval Left eval  Shoulder flexion    Shoulder abduction    Shoulder adduction    Shoulder extension    Shoulder internal rotation    Shoulder external rotation    Elbow flexion    Elbow extension    Wrist flexion  48  Wrist extension  70  Wrist ulnar deviation  30  Wrist radial deviation  20  Wrist pronation  90  Wrist supination  90  (Blank rows = not tested) Pain with resisted radial deviation on ulnar wrist  Active ROM Right eval Left eval  Thumb MCP (0-60)    Thumb IP (0-80)    Thumb Radial abd/add (0-55)     Thumb Palmar abd/add (0-45)     Thumb Opposition to Small Finger     Index MCP (0-90)     Index PIP (0-100)     Index DIP (0-70)      Long MCP (0-90)      Long PIP (0-100)      Long DIP (0-70)      Ring MCP (0-90)      Ring PIP (0-100)      Ring DIP (0-70)  Little MCP (0-90)    hyperextension  Little PIP (0-100)   -60  ext  Little DIP (0-70)      (Blank rows = not tested)   HAND FUNCTION: Grip strength: Right: 32 lbs; Left: 15  with pain  lbs, Lateral pinch: Right: 4 lbs, Left: 2 lbs, and 3 point pinch: Right: 3 lbs, Left: 2 middle finger only lbs  COORDINATION: Impaired - will assess next session  SENSATION: WFL  EDEMA: none  COGNITION: Overall cognitive status: Within functional limits for tasks assessed      TREATMENT DATE: 06/21/24                                                                                                                            Pt reports pain using 4lb green resistive clip on R hand for exercises - discussed decreasing to 2lb red clip as pt did not think to modify.  Reviewed exercises especially lateral key pinch as pt attempting instead to do 3 pt pinch in supination despite repeated verbal, visual, and tactile cues.  Educated on wearing buddy strap with pinching exercises Provided soft light blue theraputty for digit adduction exercises L hand on tabletop, completed 2 set x 8 reps each   HEP pt to do 3 x day - 10 reps each  2 lbs Red resistive clothespin for lateral pinch and 3 point pinch with buddy strap on 3rd and 2nd Middle phalanges on  L hand,  4 lbs Green resistive clothespin for lateral pinch and 3 point pinch on  R hand.   Reports her PIP extension gutter splint falling off at night, provided size 7 oval 8 splint to maintain PIP extension - educated on nighttime use and wearing with adduction exercises.  Custom hand base RMS splint fabricated for 3rd thru 5th - blocking 5th MC from hyper ext and facilitating increase ext at PIP during day with functional tasks - in combination with buddy strap at middle phalanges for 2nd and 3rd to increase flexion of 2nd digit Wearing brace had pt fold towels (simulate ADLs) and to facilitate increase PIP ext at 5th and ADD of 5th and 4th during day Educated on wearing off and on with activity and donning/doffing   PATIENT EDUCATION: Education details: findings of eval and HEP  Person educated: Patient Education method:  Explanation, Demonstration, Tactile cues, Verbal cues, and Handouts Education comprehension: verbalized understanding, returned demonstration, verbal cues required, and needs further education    GOALS: Goals reviewed with patient? Yes  SHORT TERM GOALS: Target date: 2 wks   Patient to be independent in wearing as well as donning and doffing nighttime extension gutter splint for fourth PIP as well as RMS for daytime use to facilitate decrease hyperextension of fifth MC and increase extension facilitation at PIP to less than -30 degrees. Baseline: At rest PIP extension of the fifth on the left -60 with hyperextension of the MCP.;  Attempt to fabricate RMS but patient  continued to do adduction of 4th and 5th as well as fifth putting forth into hyperextension.  Held will include next session 3rd through 5th and splint.  Patient was fitted with a buddy strap for 2nd and 4th third middle phalanges and PIP extension gutter splint for nighttime. Goal status: INITIAL      LONG TERM GOALS: Target date: 8 weeks  Left wrist active range of motion strength in all range improved to within normal limits for patient to push and pull door and turn doorknob symptom-free Baseline: Trouble with turning a doorknob using fifth digit as well as unable to push and pull heavy door because of decreased wrist flexion and pain and discomfort at wrist Goal status: INITIAL  2.  Left fifth digit extension improve to less than -15 degrees of PIP extension for patient to be able to put hand in pocket and donning gloves Baseline: Patient rife with hyperextension of the fifth metacarpal and extension of PIP -60 degrees Goal status: INITIAL  3.  Adduction of left 4th and 5th digit increase from patient to maintain adduction to be able to put hand in pocket and donning gloves symptom-free Baseline: Fifth digit rests in hyperextension at the MCP 60 degrees PIP flexion and abduction.  Compensate with flexion to adduct as well  as pulling fourth digit and abduction. Goal status: INITIAL  4.  Bilateral lateral 3-point pinch improve with 1 to 3 pounds for patient to be able to open Ziploc bags, carry plate and do buttons but more ease Baseline: Grip strength: Right: 32 lbs; Left: 15 with pain  lbs, Lateral pinch: Right: 4 lbs, Left: 2 lbs, and 3 point pinch: Right: 3 lbs, Left: 2 middle finger only lbs Goal status: INITIAL  5.  Grip strength improved in left hand with more than 5 to 8 pounds for patient to be able to carry half a gallon and left hand symptom-free Baseline: Right grip 32 pounds left 15 pounds with pain on the lateral ulnar side of the hand Goal status: INITIAL   ASSESSMENT:  CLINICAL IMPRESSION: Patient seen today for occupational therapy evaluation for left nondisplaced fracture of fifth base of metacarpal as well as left wrist sprain.  Injury occurred with a fall around 04/29/2024.  Patient was immobilize in ulnar gutter splint.  Patient present at evaluation with decreased wrist flexion more than extension.  Pain at ulnar wrist.  Patient with hyperextension of the fifth metacarpal and PIP extension lacking -60 degrees.    NOW: Fabricated custom hand base RMS fabricated for L digits 3rd thru 5th - blocking 5th MC from hyper ext and facilitating increase ext at PIP. Provided oval 8 splint for nighttime wear to promote PIP ext. Extensive review of HEP as pt unable to lateral pinch without excessive supination and use of 3rd digit  - educated on use of buddy strap. Issued soft light blue theraputty for digit adduction exercises. Patient with decreased strength and digit extension as well as grip and prehension strength. Patient limited in functional use of left hand in ADLs and IADLs related to old radius fracture on the left and inability to flex second digit at PIP DIP during grasping or lateral pinch or 3-point pinch.  Patient can benefit from skilled OT services to decrease pain increase motion, decrease  hyperextension of the fifth metacarpal increase PIP extension-fabrication of appropriate splints to facilitate correct motion and decrease risk for contractures.  Increasing strength for patient to return to prior level of function or be independent  in use of left hand in ADLs and IADLs.  PERFORMANCE DEFICITS: in functional skills including ADLs, IADLs, ROM, strength, pain, flexibility, decreased knowledge of use of DME, and UE functional use,   and psychosocial skills including environmental adaptation and routines and behaviors.   IMPAIRMENTS: are limiting patient from ADLs, IADLs, rest and sleep, play, leisure, and social participation.   COMORBIDITIES: has no other co-morbidities that affects occupational performance. Patient will benefit from skilled OT to address above impairments and improve overall function.  MODIFICATION OR ASSISTANCE TO COMPLETE EVALUATION: No modification of tasks or assist necessary to complete an evaluation.  OT OCCUPATIONAL PROFILE AND HISTORY: Problem focused assessment: Including review of records relating to presenting problem.  CLINICAL DECISION MAKING: LOW - limited treatment options, no task modification necessary  REHAB POTENTIAL: Good for goals  EVALUATION COMPLEXITY: Low      PLAN:  OT FREQUENCY: 1-2x/week  OT DURATION: 8 weeks  PLANNED INTERVENTIONS: 97168 OT Re-evaluation, 97535 self care/ADL training, 02889 therapeutic exercise, 97530 therapeutic activity, 97112 neuromuscular re-education, 97140 manual therapy, 97035 ultrasound, 97018 paraffin, 02960 fluidotherapy, 97034 contrast bath, 97760 Orthotic Initial, [id number] Orthotic/Prosthetic subsequent, passive range of motion, patient/family education, and DME and/or AE instructions    CONSULTED AND AGREED WITH PLAN OF CARE: Patient     Elston JINNY Slot, OTR/L 06/21/2024, 2:25 PM

## 2024-06-28 ENCOUNTER — Ambulatory Visit: Admitting: Occupational Therapy

## 2024-06-28 ENCOUNTER — Encounter: Payer: Self-pay | Admitting: Occupational Therapy

## 2024-06-28 DIAGNOSIS — M25642 Stiffness of left hand, not elsewhere classified: Secondary | ICD-10-CM

## 2024-06-28 DIAGNOSIS — M6281 Muscle weakness (generalized): Secondary | ICD-10-CM | POA: Diagnosis not present

## 2024-06-28 DIAGNOSIS — M79642 Pain in left hand: Secondary | ICD-10-CM

## 2024-06-28 NOTE — Therapy (Signed)
 OUTPATIENT OCCUPATIONAL THERAPY ORTHO TREATMENT  Patient Name: Nicole Bass MRN: [id number] DOB:1950-12-31, 73 y.o., female Today's Date: 06/28/2024  PCP: DR Auston REFERRING PROVIDER: Dr Edie  END OF SESSION:  OT End of Session - 06/28/24 0948     Visit Number 3    Number of Visits 12    Date for Recertification  08/13/24    OT Start Time 0948    OT Stop Time 1030    OT Time Calculation (min) 42 min    Activity Tolerance Patient tolerated treatment well    Behavior During Therapy [facility] for tasks assessed/performed            Past Medical History:  Diagnosis Date   Anxiety    CAD (coronary artery disease)    a.) R/LHC 12/27/2022: 15% p-mLAD --> med mgmt; mRA 6, mPA 19, mPCWP 10, AO sat 97%, PA sat 68%, CO 6.5, CI 3.7   Chronic dyspnea    Crohn's disease (HCC)    DDD (degenerative disc disease), cervical 2003   DDD (degenerative disc disease), lumbar    Depression    Diastolic dysfunction 05/27/2022   a.) TTE 05/27/2022: EF 60-65%, no RWMAs, G1DD, norm RVSF, no valvulopathies   Diet-controlled type 2 diabetes mellitus with peripheral neuropathy    Fatty liver    GERD (gastroesophageal reflux disease)    Hand weakness    left hand worse, small tremors   Histoplasmosis 2022   History of chicken pox    History of claustrophobia    History of kidney stones    History of shingles    HTN (hypertension)    Hyperthyroidism    IDA (iron deficiency anemia)    Iron deficiency anemia    Left-sided sensorineural hearing loss    Long term current use of immunosuppressive drug    a.) tofacitinib  for RA + prednisone    Long term current use of systemic steroids    a.) prednisone  for RA   Lung nodule    Lymphatic disorder    Macular hole of right eye    Meniere's disease    Mixed hyperlipidemia    Motion sickness    Nasal fracture 03/27/2020   due to fall   Neuropathy involving both lower extremities    OSA on CPAP    Osteoarthritis of both knees    Osteoporosis     Peripheral neuropathy    Pneumonia    Pyelonephritis    RA (rheumatoid arthritis) (HCC)    a.) Tx'd with tofacitinib  + prednisone    Restless leg syndrome    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HCC)    Venous insufficiency    a.) s/p superficial vein ablation 10/08/2021   Vitamin B 12 deficiency    Wears dentures    upper dentures   Past Surgical History:  Procedure Laterality Date   ABDOMINAL HYSTERECTOMY  2005   APPENDECTOMY     BACK SURGERY     x 3   CATARACT EXTRACTION W/ INTRAOCULAR LENS IMPLANT Right 07/06/2021   CATARACT EXTRACTION W/ INTRAOCULAR LENS IMPLANT Left 07/27/2021   CERVICAL SPINE SURGERY     metal plate   CESAREAN SECTION  1976 & 1989   X 2   CHOLECYSTECTOMY     CLOSED REDUCTION NASAL FRACTURE N/A 04/09/2020   Procedure: CLOSED REDUCTION NASAL FRACTURE;  Surgeon: Edda Mt, MD;  Location: [facility];  Service: ENT;  Laterality: N/A;   COLON SURGERY  1999   removed 18 inches of colon,removed appendix   COLONOSCOPY  1998, 2001, 2004, 2008, 2009, 2010, 2014, 2018   COLONOSCOPY WITH PROPOFOL  N/A 08/02/2021   Procedure: COLONOSCOPY WITH PROPOFOL ;  Surgeon: Maryruth Ole DASEN, MD;  Location: [facility];  Service: Endoscopy;  Laterality: N/A;   dental implants     3 teeth/ wears dentures   DILATION AND CURETTAGE OF UTERUS  2004   ELBOW FRACTURE SURGERY Left 2008   ESOPHAGOGASTRODUODENOSCOPY     1998, 2001, 2004, 2008, 2009, 2010, 2014   FIBEROPTIC BRONCHOSCOPY N/A 06/01/2020   Procedure: BEDSIDE BRONCHOSCOPY FIBEROPTIC;  Surgeon: Parris Manna, MD;  Location: [facility] OR[facility];  Service: Thoracic;  Laterality: N/A;   FRACTURE SURGERY     HALLUX VALGUS CORRECTION Right 12/27/2012   JOINT REPLACEMENT Right 2015   partial knee replacement   KYPHOPLASTY     KYPHOPLASTY N/A 04/14/2015   Procedure: KYPHOPLASTY;  Surgeon: Ozell Flake, MD;  Location: [facility];  Service: Orthopedics;  Laterality: N/A;   KYPHOPLASTY N/A 06/04/2015   Procedure: KYPHOPLASTY  L-5;  Surgeon: Ozell Flake, MD;  Location: [facility];  Service: Orthopedics;  Laterality: N/A;   KYPHOPLASTY     LUMBAR LAMINECTOMY FOR EPIDURAL ABSCESS N/A 10/12/2020   Procedure: SYNOVIAL CYST RESECTION;  Surgeon: Bluford Standing, MD;  Location: [facility];  Service: Neurosurgery;  Laterality: N/A;   MAXIMUM ACCESS (MAS) TRANSFORAMINAL LUMBAR INTERBODY FUSION (TLIF) 1 LEVEL Left 10/12/2020   Procedure: OPEN L5/S1 TRANSFORAMINAL LUMBAR INTERBODY FUSION (TLIF) 1 LEVEL;  Surgeon: Bluford Standing, MD;  Location: [facility];  Service: Neurosurgery;  Laterality: Left;   MEDIAL PARTIAL KNEE REPLACEMENT Right 2015   NECK/PLATE  7997   OOPHORECTOMY Bilateral    ORIF NASAL FRACTURE N/A 04/09/2020   Procedure: OPEN REDUCTION INTERNAL FIXATION (ORIF) NASAL FRACTURE;  Surgeon: Edda Mt, MD;  Location: [facility];  Service: ENT;  Laterality: N/A;   PARS PLANA VITRECTOMY Right 09/08/2022   REPAIR EXTENSOR TENDON Left 01/08/2019   Procedure: Realignment  EXTENSOR TENDON;  Surgeon: Flake Ozell, MD;  Location: [facility];  Service: Orthopedics;  Laterality: Left;   REVERSE SHOULDER ARTHROPLASTY Right 04/18/2019   Procedure: REVERSE SHOULDER ARTHROPLASTY;  Surgeon: Edie Norleen PARAS, MD;  Location: [facility];  Service: Orthopedics;  Laterality: Right;   RIGHT/LEFT HEART CATH AND CORONARY ANGIOGRAPHY Bilateral 12/27/2022   Procedure: RIGHT/LEFT HEART CATH AND CORONARY ANGIOGRAPHY;  Surgeon: Mady Bruckner, MD;  Location: [facility];  Service: Cardiovascular;  Laterality: Bilateral;   SHOULDER ARTHROSCOPY WITH ROTATOR CUFF REPAIR AND SUBACROMIAL DECOMPRESSION Right 04/13/2016   Procedure: SHOULDER ARTHROSCOPY WITH ROTATOR CUFF REPAIR AND SUBACROMIAL DECOMPRESSION, release of long head biceps tendon;  Surgeon: Helayne Glenn, MD;  Location: [facility];  Service: Orthopedics;  Laterality: Right;   TOE SURGERY Right 2013   first MTP osteotomy   TONSILLECTOMY  1995   UPPER GI ENDOSCOPY     WRIST FRACTURE  SURGERY Bilateral 2016   Patient Active Problem List   Diagnosis Date Noted   At high risk for injury related to fall 01/05/2023   Bronchiectasis (HCC) 01/05/2023   Hypoxia 01/05/2023   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   Volume depletion 01/05/2023   Weakness 12/07/2022   Elevated troponin 12/07/2022   Sepsis secondary to UTI (HCC) 12/07/2022   Near syncope 12/07/2022   Hypotension 12/06/2022   HTN (hypertension) 12/06/2022   Severe sepsis (HCC) 12/06/2022   UTI (urinary tract infection) 12/06/2022   Left wrist fracture 12/06/2022   RA (rheumatoid arthritis) (HCC) 12/06/2022   Myocardial injury 12/06/2022   Acute renal failure superimposed on  stage 3a chronic kidney disease (HCC) 12/06/2022   Hypophosphatemia 12/06/2022   Lung nodule 12/06/2022   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   Diabetes mellitus type 2 without retinopathy (HCC) 08/03/2022   Epiretinal membrane (ERM) of right eye 08/03/2022   Pseudophakia of both eyes 08/03/2022   Vitreomacular adhesion of left eye 08/03/2022   Vitreomacular traction syndrome of both eyes 08/03/2022   AKI (acute kidney injury) 06/03/2022   Acute hypoxemic respiratory failure (HCC) 06/03/2022   Gastroesophageal reflux disease without esophagitis 05/11/2022   Macular cyst, hole, or pseudohole, right eye 05/11/2022   OSA (obstructive sleep apnea) 05/11/2022   Varicose veins of both lower extremities with pain 04/19/2022   Lymphedema 04/19/2022   Mixed hyperlipidemia 02/23/2022   Rotator cuff tendinitis, left 12/04/2020   Syncope 11/09/2020   Normocytic anemia 11/09/2020   Hypomagnesemia 11/09/2020   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 11/09/2020   Chronic diastolic CHF (congestive heart failure) (HCC) 11/09/2020   Cryptococcus (HCC) 10/25/2020   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   Fusion of lumbar spine 10/17/2020   Lumbar stenosis 10/12/2020   Swelling of limb 06/02/2020   Cervical spondylosis  05/01/2020   Atherosclerosis of native coronary artery of native heart without angina pectoris 04/27/2020   Numbness and tingling in left hand 10/29/2019   Status post reverse total shoulder replacement, 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   Status post right partial knee replacement 02/12/2019   Rotator cuff arthropathy, right 01/25/2019   Iron deficiency anemia 09/13/2018   Anemia, unspecified 09/09/2018   Iron deficiency 09/04/2018   Acute kidney injury 08/23/2018   HTN, goal below 140/80 06/04/2018   Hyperthyroidism 02/08/2018   Imbalance 12/18/2017   Stomatitis, ulcerative 09/06/2017   Dyspareunia in female 08/22/2017   Vaginal atrophy 08/22/2017   Obesity (BMI 30.0-34.9) 08/22/2017   Varicose veins of leg with pain, left 04/05/2017   Peripheral polyneuropathy 04/05/2017   Lumbar radiculopathy 04/05/2017   Tremor 02/13/2017   Sensory neuropathy 02/13/2017   Restless leg syndrome 02/13/2017   Impingement syndrome of left shoulder 09/29/2016   Acute diarrhea 09/06/2016   History of pyelonephritis 09/06/2016   Hypokalemia 09/06/2016   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   Pyelonephritis 08/29/2016   Rheumatoid arthritis, unspecified (HCC) 08/29/2016   Nontraumatic complete tear of right rotator cuff 03/08/2016   Acute pain of right shoulder 01/27/2016   Type 2 diabetes mellitus with diabetic neuropathy (HCC) 10/27/2015   Closed fracture of distal end of right radius with routine healing 09/17/2015   Crohn's colitis (HCC) 09/04/2015   Acute bilateral low back pain without sciatica 03/23/2015   Dyspnea 01/15/2015   Pedal edema 01/15/2015   Status post open reduction with internal fixation of fracture 11/27/2014   Aftercare for healing traumatic fracture of lower arm 09/25/2014   Painful rib 09/11/2014   Primary osteoarthritis of left knee 04/24/2014   Osteoporosis 03/17/2014   Depression with anxiety 03/17/2014   B12 deficiency 03/17/2014   Crohn's disease, unspecified, without  complications (HCC) 03/17/2014   Urolith 07/16/2013   Right flank pain 07/16/2013   Abdominal pain 07/16/2013    ONSET DATE: 04/28/24  REFERRING DIAG: Nondisplaced fracture of left fifth digit base of metacarpal and left wrist sprain.  THERAPY DIAG:  Stiffness of left hand, not elsewhere classified  Pain in left hand  Muscle weakness (generalized)  Rationale for Evaluation and Treatment: Rehabilitation  SUBJECTIVE:   SUBJECTIVE STATEMENT: I have been using the buddy strap a lot more except washing dishes.  Pt  accompanied by: self  PERTINENT HISTORY:  Dr Edie 04/29/24: The left wrist/hand x-rays demonstrated a nondisplaced fracture involving the base of the fifth metacarpal, as well as postsurgical changes resulting from a prior ORIF of a left distal radius fracture performed by Dr. Francisco several years ago. The patient was placed into an ulnar gutter splint and advised to follow-up with orthopedics.   Dr Edie 05/27/24: Nicole Bass is a 73 y.o. female who presents for follow-up of a nondisplaced extra-articular fracture of the left fifth metacarpal, as well as left wrist sprain and left shoulder sprain injuries incurred as a result of a fall 1 month ago. On today's visit, she rates her pain at 2/10 in both the left wrist/hand as well as in the left shoulder.  She does note some residual weakness and discomfort in the wrist, but has already began resuming her normal daily activities. She will be referred to occupational therapy for progressive range of motion and strength exercises in order to optimize her overall function. Regarding her right shoulder symptoms, she does not feel that they are sufficiently severe as to require more aggressive treatment for the nonunited scapular spine fracture. I have recommended that she continue with her normal daily activities and home exercises, but to avoid offending activities. She may take over-the-counter medications as needed for discomfort.  All of the patient's questions and concerns were answered. She can call any time with further concerns. She will follow up in 6 to 8 weeks for re-evaluation   PRECAUTIONS: None  RED FLAGS: None   WEIGHT BEARING RESTRICTIONS: No  PAIN:  Are you having pain?  Left ulnar wrist and hypothenar eminence of hand 5/10  FALLS: Has patient fallen in last 6 months? Yes. Number of falls 1  LIVING ENVIRONMENT: Lives with: lives with their family and lives with their spouse  PLOF: Independent Difficulty with IADLs (cutting, picking up items, and turning pages) loves to do scrapbook , also doing her church bulletin  board and pictures   PATIENT GOALS: I want my pinkie straight and more strength in my hand and wrist   NEXT MD VISIT: ?  OBJECTIVE:  Note: Objective measures were completed at Evaluation unless otherwise noted.  HAND DOMINANCE: Right  ADLs: WFL  FUNCTIONAL OUTCOME MEASURES: PRWHE pain and function  UPPER EXTREMITY ROM:     Active ROM Right eval Left eval  Shoulder flexion    Shoulder abduction    Shoulder adduction    Shoulder extension    Shoulder internal rotation    Shoulder external rotation    Elbow flexion    Elbow extension    Wrist flexion  48  Wrist extension  70  Wrist ulnar deviation  30  Wrist radial deviation  20  Wrist pronation  90  Wrist supination  90  (Blank rows = not tested) Pain with resisted radial deviation on ulnar wrist  Active ROM Right eval Left eval  Thumb MCP (0-60)    Thumb IP (0-80)    Thumb Radial abd/add (0-55)     Thumb Palmar abd/add (0-45)     Thumb Opposition to Small Finger     Index MCP (0-90)     Index PIP (0-100)     Index DIP (0-70)      Long MCP (0-90)      Long PIP (0-100)      Long DIP (0-70)      Ring MCP (0-90)      Ring PIP (0-100)  Ring DIP (0-70)      Little MCP (0-90)    hyperextension  Little PIP (0-100)   -60  ext  Little DIP (0-70)      (Blank rows = not tested)   HAND FUNCTION: Grip  strength: Right: 32 lbs; Left: 15 with pain  lbs, Lateral pinch: Right: 4 lbs, Left: 2 lbs, and 3 point pinch: Right: 3 lbs, Left: 2 middle finger only lbs 06/28/24: Grip strength: Right: 38 lbs; Left: 24 lbs, Lateral pinch: Right: 4 lbs, Left: 2 lbs, and 3 point pinch: Right: 4 lbs, Left: 1 lb  COORDINATION: Impaired - will assess next session 06/28/24: R 28 sec, L 46 sec  SENSATION: WFL  EDEMA: none  COGNITION: Overall cognitive status: Within functional limits for tasks assessed      TREATMENT DATE: 06/28/24                                                                                                                            Pt has not been wearing any nighttime splint due to not feeling well with sinuses this week, plan to restart wearing.  Reinforced use of buddy strap with resistive clip exercises and oval 8 splint for digit adduction exercises.  Reviewed soft light blue theraputty exercises with MIN cues - digit adduction exercises L hand on tabletop, completed 2 set x 8 reps each (reports was not completing at home because too difficulty, educated on using smaller sized balls. -Rolling putty and 3 pt pinch with buddy strap as pt continues to report difficulty coordinating resistive clips  HEP pt to do 3 x day - 10 reps each  -2 lbs Red resistive clothespin for lateral pinch and 3 point pinch with buddy strap on 3rd and 2nd Middle phalanges on  L hand,  -4 lbs Green resistive clothespin for lateral pinch and 3 point pinch on  R hand.  - digit adduction exercises L hand on tabletop, completed 2 set x 8 reps each (reports was not completing at home because too difficulty, educated on using smaller sized balls -Rolling putty and pinching 3 pt pinch  PATIENT EDUCATION: Education details: findings of eval and HEP  Person educated: Patient Education method: Explanation, Demonstration, Tactile cues, Verbal cues, and Handouts Education comprehension: verbalized understanding,  returned demonstration, verbal cues required, and needs further education    GOALS: Goals reviewed with patient? Yes  SHORT TERM GOALS: Target date: 2 wks   Patient to be independent in wearing as well as donning and doffing nighttime extension gutter splint for fourth PIP as well as RMS for daytime use to facilitate decrease hyperextension of fifth MC and increase extension facilitation at PIP to less than -30 degrees. Baseline: At rest PIP extension of the fifth on the left -60 with hyperextension of the MCP.;  Attempt to fabricate RMS but patient continued to do adduction of 4th and 5th as well as fifth putting forth into hyperextension.  Held will include  next session 3rd through 5th and splint.  Patient was fitted with a buddy strap for 2nd and 4th third middle phalanges and PIP extension gutter splint for nighttime. Goal status: INITIAL   LONG TERM GOALS: Target date: 8 weeks  Left wrist active range of motion strength in all range improved to within normal limits for patient to push and pull door and turn doorknob symptom-free Baseline: Trouble with turning a doorknob using fifth digit as well as unable to push and pull heavy door because of decreased wrist flexion and pain and discomfort at wrist Goal status: INITIAL  2.  Left fifth digit extension improve to less than -15 degrees of PIP extension for patient to be able to put hand in pocket and donning gloves Baseline: Patient rife with hyperextension of the fifth metacarpal and extension of PIP -60 degrees Goal status: INITIAL  3.  Adduction of left 4th and 5th digit increase from patient to maintain adduction to be able to put hand in pocket and donning gloves symptom-free Baseline: Fifth digit rests in hyperextension at the MCP 60 degrees PIP flexion and abduction.  Compensate with flexion to adduct as well as pulling fourth digit and abduction. Goal status: INITIAL  4.  Bilateral lateral 3-point pinch improve with 1 to 3  pounds for patient to be able to open Ziploc bags, carry plate and do buttons but more ease Baseline: Grip strength: Right: 32 lbs; Left: 15 with pain  lbs, Lateral pinch: Right: 4 lbs, Left: 2 lbs, and 3 point pinch: Right: 3 lbs, Left: 2 middle finger only lbs Goal status: INITIAL  5.  Grip strength improved in left hand with more than 5 to 8 pounds for patient to be able to carry half a gallon and left hand symptom-free Baseline: Right grip 32 pounds left 15 pounds with pain on the lateral ulnar side of the hand Goal status: INITIAL   ASSESSMENT:  CLINICAL IMPRESSION: Patient seen today for occupational therapy evaluation for left nondisplaced fracture of fifth base of metacarpal as well as left wrist sprain.  Injury occurred with a fall around 04/29/2024.  Patient was immobilize in ulnar gutter splint.  Patient present at evaluation with decreased wrist flexion more than extension.  Pain at ulnar wrist.  Patient with hyperextension of the fifth metacarpal and PIP extension lacking -60 degrees.    NOW: Pt presents with improved 5th digit adduction, reports she has increased wearing her buddy strap, educated on wearing oval 8 splint with adduction exercises. Reviewed soft light blue theraputty exercises with MIN cues. Tested coordination with L hand deficits (9 hole peg 46 sec). Patient limited in functional use of left hand in ADLs and IADLs related to old radius fracture on the left and inability to flex second digit at PIP DIP during grasping or lateral pinch or 3-point pinch.  Patient can benefit from skilled OT services to decrease pain increase motion, decrease hyperextension of the fifth metacarpal increase PIP extension-fabrication of appropriate splints to facilitate correct motion and decrease risk for contractures.  Increasing strength for patient to return to prior level of function or be independent in use of left hand in ADLs and IADLs.  PERFORMANCE DEFICITS: in functional skills  including ADLs, IADLs, ROM, strength, pain, flexibility, decreased knowledge of use of DME, and UE functional use,   and psychosocial skills including environmental adaptation and routines and behaviors.   IMPAIRMENTS: are limiting patient from ADLs, IADLs, rest and sleep, play, leisure, and social participation.   COMORBIDITIES:  has no other co-morbidities that affects occupational performance. Patient will benefit from skilled OT to address above impairments and improve overall function.  MODIFICATION OR ASSISTANCE TO COMPLETE EVALUATION: No modification of tasks or assist necessary to complete an evaluation.  OT OCCUPATIONAL PROFILE AND HISTORY: Problem focused assessment: Including review of records relating to presenting problem.  CLINICAL DECISION MAKING: LOW - limited treatment options, no task modification necessary  REHAB POTENTIAL: Good for goals  EVALUATION COMPLEXITY: Low      PLAN:  OT FREQUENCY: 1-2x/week  OT DURATION: 8 weeks  PLANNED INTERVENTIONS: 97168 OT Re-evaluation, 97535 self care/ADL training, 02889 therapeutic exercise, 97530 therapeutic activity, 97112 neuromuscular re-education, 97140 manual therapy, 97035 ultrasound, 97018 paraffin, 02960 fluidotherapy, 97034 contrast bath, 97760 Orthotic Initial, [id number] Orthotic/Prosthetic subsequent, passive range of motion, patient/family education, and DME and/or AE instructions    CONSULTED AND AGREED WITH PLAN OF CARE: Patient     Elston JINNY Slot, OTR/L 06/28/2024, 9:49 AM

## 2024-06-30 ENCOUNTER — Ambulatory Visit
Admission: RE | Admit: 2024-06-30 | Discharge: 2024-06-30 | Disposition: A | Source: Ambulatory Visit | Attending: Physician Assistant

## 2024-06-30 DIAGNOSIS — G629 Polyneuropathy, unspecified: Secondary | ICD-10-CM

## 2024-06-30 DIAGNOSIS — M48061 Spinal stenosis, lumbar region without neurogenic claudication: Secondary | ICD-10-CM

## 2024-07-02 ENCOUNTER — Ambulatory Visit: Admitting: Occupational Therapy

## 2024-07-02 DIAGNOSIS — M79642 Pain in left hand: Secondary | ICD-10-CM

## 2024-07-02 DIAGNOSIS — M25532 Pain in left wrist: Secondary | ICD-10-CM

## 2024-07-02 DIAGNOSIS — M25642 Stiffness of left hand, not elsewhere classified: Secondary | ICD-10-CM

## 2024-07-02 DIAGNOSIS — M6281 Muscle weakness (generalized): Secondary | ICD-10-CM

## 2024-07-02 NOTE — Therapy (Signed)
 OUTPATIENT OCCUPATIONAL THERAPY ORTHO TREATMENT  Patient Name: Nicole Bass MRN: [id number] DOB:Dec 28, 1950, 73 y.o., female Today's Date: 07/02/2024  PCP: DR Auston REFERRING PROVIDER: Dr Edie  END OF SESSION:  OT End of Session - 07/02/24 1320     Visit Number 4    Number of Visits 12    Date for Recertification  08/13/24    OT Start Time 1320    Activity Tolerance Patient tolerated treatment well    Behavior During Therapy [facility] for tasks assessed/performed            Past Medical History:  Diagnosis Date   Anxiety    CAD (coronary artery disease)    a.) R/LHC 12/27/2022: 15% p-mLAD --> med mgmt; mRA 6, mPA 19, mPCWP 10, AO sat 97%, PA sat 68%, CO 6.5, CI 3.7   Chronic dyspnea    Crohn's disease (HCC)    DDD (degenerative disc disease), cervical 2003   DDD (degenerative disc disease), lumbar    Depression    Diastolic dysfunction 05/27/2022   a.) TTE 05/27/2022: EF 60-65%, no RWMAs, G1DD, norm RVSF, no valvulopathies   Diet-controlled type 2 diabetes mellitus with peripheral neuropathy    Fatty liver    GERD (gastroesophageal reflux disease)    Hand weakness    left hand worse, small tremors   Histoplasmosis 2022   History of chicken pox    History of claustrophobia    History of kidney stones    History of shingles    HTN (hypertension)    Hyperthyroidism    IDA (iron deficiency anemia)    Iron deficiency anemia    Left-sided sensorineural hearing loss    Long term current use of immunosuppressive drug    a.) tofacitinib  for RA + prednisone    Long term current use of systemic steroids    a.) prednisone  for RA   Lung nodule    Lymphatic disorder    Macular hole of right eye    Meniere's disease    Mixed hyperlipidemia    Motion sickness    Nasal fracture 03/27/2020   due to fall   Neuropathy involving both lower extremities    OSA on CPAP    Osteoarthritis of both knees    Osteoporosis    Peripheral neuropathy    Pneumonia    Pyelonephritis     RA (rheumatoid arthritis) (HCC)    a.) Tx'd with tofacitinib  + prednisone    Restless leg syndrome    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HCC)    Venous insufficiency    a.) s/p superficial vein ablation 10/08/2021   Vitamin B 12 deficiency    Wears dentures    upper dentures   Past Surgical History:  Procedure Laterality Date   ABDOMINAL HYSTERECTOMY  2005   APPENDECTOMY     BACK SURGERY     x 3   CATARACT EXTRACTION W/ INTRAOCULAR LENS IMPLANT Right 07/06/2021   CATARACT EXTRACTION W/ INTRAOCULAR LENS IMPLANT Left 07/27/2021   CERVICAL SPINE SURGERY     metal plate   CESAREAN SECTION  1976 & 1989   X 2   CHOLECYSTECTOMY     CLOSED REDUCTION NASAL FRACTURE N/A 04/09/2020   Procedure: CLOSED REDUCTION NASAL FRACTURE;  Surgeon: Edda Mt, MD;  Location: [facility];  Service: ENT;  Laterality: N/A;   COLON SURGERY  1999   removed 18 inches of colon,removed appendix   COLONOSCOPY     1998, 2001, 2004, 2008, 2009, 2010, 2014, 2018   COLONOSCOPY WITH  PROPOFOL  N/A 08/02/2021   Procedure: COLONOSCOPY WITH PROPOFOL ;  Surgeon: Maryruth Ole DASEN, MD;  Location: [facility] ENDOSCOPY;  Service: Endoscopy;  Laterality: N/A;   dental implants     3 teeth/ wears dentures   DILATION AND CURETTAGE OF UTERUS  2004   ELBOW FRACTURE SURGERY Left 2008   ESOPHAGOGASTRODUODENOSCOPY     1998, 2001, 2004, 2008, 2009, 2010, 2014   FIBEROPTIC BRONCHOSCOPY N/A 06/01/2020   Procedure: BEDSIDE BRONCHOSCOPY FIBEROPTIC;  Surgeon: Parris Manna, MD;  Location: [facility] ORS;  Service: Thoracic;  Laterality: N/A;   FRACTURE SURGERY     HALLUX VALGUS CORRECTION Right 12/27/2012   JOINT REPLACEMENT Right 2015   partial knee replacement   KYPHOPLASTY     KYPHOPLASTY N/A 04/14/2015   Procedure: KYPHOPLASTY;  Surgeon: Ozell Flake, MD;  Location: [facility] ORS;  Service: Orthopedics;  Laterality: N/A;   KYPHOPLASTY N/A 06/04/2015   Procedure: KYPHOPLASTY L-5;  Surgeon: Ozell Flake, MD;  Location: [facility] ORS;   Service: Orthopedics;  Laterality: N/A;   KYPHOPLASTY     LUMBAR LAMINECTOMY FOR EPIDURAL ABSCESS N/A 10/12/2020   Procedure: SYNOVIAL CYST RESECTION;  Surgeon: Bluford Standing, MD;  Location: [facility] ORS;  Service: Neurosurgery;  Laterality: N/A;   MAXIMUM ACCESS (MAS) TRANSFORAMINAL LUMBAR INTERBODY FUSION (TLIF) 1 LEVEL Left 10/12/2020   Procedure: OPEN L5/S1 TRANSFORAMINAL LUMBAR INTERBODY FUSION (TLIF) 1 LEVEL;  Surgeon: Bluford Standing, MD;  Location: [facility] ORS;  Service: Neurosurgery;  Laterality: Left;   MEDIAL PARTIAL KNEE REPLACEMENT Right 2015   NECK/PLATE  7997   OOPHORECTOMY Bilateral    ORIF NASAL FRACTURE N/A 04/09/2020   Procedure: OPEN REDUCTION INTERNAL FIXATION (ORIF) NASAL FRACTURE;  Surgeon: Edda Mt, MD;  Location: [facility];  Service: ENT;  Laterality: N/A;   PARS PLANA VITRECTOMY Right 09/08/2022   REPAIR EXTENSOR TENDON Left 01/08/2019   Procedure: Realignment  EXTENSOR TENDON;  Surgeon: Flake Ozell, MD;  Location: [facility] ORS;  Service: Orthopedics;  Laterality: Left;   REVERSE SHOULDER ARTHROPLASTY Right 04/18/2019   Procedure: REVERSE SHOULDER ARTHROPLASTY;  Surgeon: Edie Norleen PARAS, MD;  Location: [facility] OR[facility];  Service: Orthopedics;  Laterality: Right;   RIGHT/LEFT HEART CATH AND CORONARY ANGIOGRAPHY Bilateral 12/27/2022   Procedure: RIGHT/LEFT HEART CATH AND CORONARY ANGIOGRAPHY;  Surgeon: Mady Bruckner, MD;  Location: [facility] INVASIVE CV LAB;  Service: Cardiovascular;  Laterality: Bilateral;   SHOULDER ARTHROSCOPY WITH ROTATOR CUFF REPAIR AND SUBACROMIAL DECOMPRESSION Right 04/13/2016   Procedure: SHOULDER ARTHROSCOPY WITH ROTATOR CUFF REPAIR AND SUBACROMIAL DECOMPRESSION, release of long head biceps tendon;  Surgeon: Helayne Glenn, MD;  Location: [facility] OR[facility];  Service: Orthopedics;  Laterality: Right;   TOE SURGERY Right 2013   first MTP osteotomy   TONSILLECTOMY  1995   UPPER GI ENDOSCOPY     WRIST FRACTURE SURGERY Bilateral 2016   Patient Active Problem List    Diagnosis Date Noted   At high risk for injury related to fall 01/05/2023   Bronchiectasis (HCC) 01/05/2023   Hypoxia 01/05/2023   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   Volume depletion 01/05/2023   Weakness 12/07/2022   Elevated troponin 12/07/2022   Sepsis secondary to UTI (HCC) 12/07/2022   Near syncope 12/07/2022   Hypotension 12/06/2022   HTN (hypertension) 12/06/2022   Severe sepsis (HCC) 12/06/2022   UTI (urinary tract infection) 12/06/2022   Left wrist fracture 12/06/2022   RA (rheumatoid arthritis) (HCC) 12/06/2022   Myocardial injury 12/06/2022   Acute renal failure superimposed on stage 3a chronic kidney disease (HCC) 12/06/2022   Hypophosphatemia 12/06/2022  Lung nodule 12/06/2022   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   Diabetes mellitus type 2 without retinopathy (HCC) 08/03/2022   Epiretinal membrane (ERM) of right eye 08/03/2022   Pseudophakia of both eyes 08/03/2022   Vitreomacular adhesion of left eye 08/03/2022   Vitreomacular traction syndrome of both eyes 08/03/2022   AKI (acute kidney injury) 06/03/2022   Acute hypoxemic respiratory failure (HCC) 06/03/2022   Gastroesophageal reflux disease without esophagitis 05/11/2022   Macular cyst, hole, or pseudohole, right eye 05/11/2022   OSA (obstructive sleep apnea) 05/11/2022   Varicose veins of both lower extremities with pain 04/19/2022   Lymphedema 04/19/2022   Mixed hyperlipidemia 02/23/2022   Rotator cuff tendinitis, left 12/04/2020   Syncope 11/09/2020   Normocytic anemia 11/09/2020   Hypomagnesemia 11/09/2020   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 11/09/2020   Chronic diastolic CHF (congestive heart failure) (HCC) 11/09/2020   Cryptococcus (HCC) 10/25/2020   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   Fusion of lumbar spine 10/17/2020   Lumbar stenosis 10/12/2020   Swelling of limb 06/02/2020   Cervical spondylosis 05/01/2020   Atherosclerosis of native coronary artery  of native heart without angina pectoris 04/27/2020   Numbness and tingling in left hand 10/29/2019   Status post reverse total shoulder replacement, 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   Status post right partial knee replacement 02/12/2019   Rotator cuff arthropathy, right 01/25/2019   Iron deficiency anemia 09/13/2018   Anemia, unspecified 09/09/2018   Iron deficiency 09/04/2018   Acute kidney injury 08/23/2018   HTN, goal below 140/80 06/04/2018   Hyperthyroidism 02/08/2018   Imbalance 12/18/2017   Stomatitis, ulcerative 09/06/2017   Dyspareunia in female 08/22/2017   Vaginal atrophy 08/22/2017   Obesity (BMI 30.0-34.9) 08/22/2017   Varicose veins of leg with pain, left 04/05/2017   Peripheral polyneuropathy 04/05/2017   Lumbar radiculopathy 04/05/2017   Tremor 02/13/2017   Sensory neuropathy 02/13/2017   Restless leg syndrome 02/13/2017   Impingement syndrome of left shoulder 09/29/2016   Acute diarrhea 09/06/2016   History of pyelonephritis 09/06/2016   Hypokalemia 09/06/2016   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   Pyelonephritis 08/29/2016   Rheumatoid arthritis, unspecified (HCC) 08/29/2016   Nontraumatic complete tear of right rotator cuff 03/08/2016   Acute pain of right shoulder 01/27/2016   Type 2 diabetes mellitus with diabetic neuropathy (HCC) 10/27/2015   Closed fracture of distal end of right radius with routine healing 09/17/2015   Crohn's colitis (HCC) 09/04/2015   Acute bilateral low back pain without sciatica 03/23/2015   Dyspnea 01/15/2015   Pedal edema 01/15/2015   Status post open reduction with internal fixation of fracture 11/27/2014   Aftercare for healing traumatic fracture of lower arm 09/25/2014   Painful rib 09/11/2014   Primary osteoarthritis of left knee 04/24/2014   Osteoporosis 03/17/2014   Depression with anxiety 03/17/2014   B12 deficiency 03/17/2014   Crohn's disease, unspecified, without complications (HCC) 03/17/2014   Urolith 07/16/2013    Right flank pain 07/16/2013   Abdominal pain 07/16/2013    ONSET DATE: 04/28/24  REFERRING DIAG: Nondisplaced fracture of left fifth digit base of metacarpal and left wrist sprain.  THERAPY DIAG:  Stiffness of left hand, not elsewhere classified  Pain in left hand  Muscle weakness (generalized)  Pain in left wrist  Rationale for Evaluation and Treatment: Rehabilitation  SUBJECTIVE:   SUBJECTIVE STATEMENT: I have not been sleeping in the splint -and done the exercises and some of my own Pt accompanied by: self  PERTINENT  HISTORY:  Dr Edie 04/29/24: The left wrist/hand x-rays demonstrated a nondisplaced fracture involving the base of the fifth metacarpal, as well as postsurgical changes resulting from a prior ORIF of a left distal radius fracture performed by Dr. Francisco several years ago. The patient was placed into an ulnar gutter splint and advised to follow-up with orthopedics.   Dr Edie 05/27/24: Nicole Bass is a 73 y.o. female who presents for follow-up of a nondisplaced extra-articular fracture of the left fifth metacarpal, as well as left wrist sprain and left shoulder sprain injuries incurred as a result of a fall 1 month ago. On today's visit, she rates her pain at 2/10 in both the left wrist/hand as well as in the left shoulder.  She does note some residual weakness and discomfort in the wrist, but has already began resuming her normal daily activities. She will be referred to occupational therapy for progressive range of motion and strength exercises in order to optimize her overall function. Regarding her right shoulder symptoms, she does not feel that they are sufficiently severe as to require more aggressive treatment for the nonunited scapular spine fracture. I have recommended that she continue with her normal daily activities and home exercises, but to avoid offending activities. She may take over-the-counter medications as needed for discomfort. All of the patient's  questions and concerns were answered. She can call any time with further concerns. She will follow up in 6 to 8 weeks for re-evaluation   PRECAUTIONS: None  RED FLAGS: None   WEIGHT BEARING RESTRICTIONS: No  PAIN:  Are you having pain?  Pt did not report any pain in session  FALLS: Has patient fallen in last 6 months? Yes. Number of falls 1  LIVING ENVIRONMENT: Lives with: lives with their family and lives with their spouse  PLOF: Independent Difficulty with IADLs (cutting, picking up items, and turning pages) loves to do scrapbook , also doing her church bulletin  board and pictures   PATIENT GOALS: I want my pinkie straight and more strength in my hand and wrist   NEXT MD VISIT: ?  OBJECTIVE:  Note: Objective measures were completed at Evaluation unless otherwise noted.  HAND DOMINANCE: Right  ADLs: WFL  FUNCTIONAL OUTCOME MEASURES: PRWHE pain and function  UPPER EXTREMITY ROM:     Active ROM Right eval Left eval  Shoulder flexion    Shoulder abduction    Shoulder adduction    Shoulder extension    Shoulder internal rotation    Shoulder external rotation    Elbow flexion    Elbow extension    Wrist flexion  48  Wrist extension  70  Wrist ulnar deviation  30  Wrist radial deviation  20  Wrist pronation  90  Wrist supination  90  (Blank rows = not tested) Pain with resisted radial deviation on ulnar wrist  Active ROM Right eval Left eval  Thumb MCP (0-60)    Thumb IP (0-80)    Thumb Radial abd/add (0-55)     Thumb Palmar abd/add (0-45)     Thumb Opposition to Small Finger     Index MCP (0-90)     Index PIP (0-100)     Index DIP (0-70)      Long MCP (0-90)      Long PIP (0-100)      Long DIP (0-70)      Ring MCP (0-90)      Ring PIP (0-100)      Ring DIP (  0-70)      Little MCP (0-90)    hyperextension  Little PIP (0-100)   -60  ext  Little DIP (0-70)      (Blank rows = not tested)   HAND FUNCTION: Grip strength: Right: 32 lbs; Left: 15  with pain  lbs, Lateral pinch: Right: 4 lbs, Left: 2 lbs, and 3 point pinch: Right: 3 lbs, Left: 2 middle finger only lbs 06/28/24: Grip strength: Right: 38 lbs; Left: 24 lbs, Lateral pinch: Right: 4 lbs, Left: 2 lbs, and 3 point pinch: Right: 4 lbs, Left: 1 lb  COORDINATION: Impaired -  06/28/24: R 28 sec, L 46 sec  SENSATION: WFL  EDEMA: none  COGNITION: Overall cognitive status: Within functional limits for tasks assessed      TREATMENT DATE: 07/02/24                                                                                                                            Pt has not been wearing any nighttime splint Assess fit and cut down to fit better and straps to fit well to prevent splint sliding off 5th digit  Reinforced use of buddy strap wearing for 2nd and 3rd digit at middle phalanges - and to use with h resistive clip exercises  - pt only to do 4 lbs clip - 2 lbs to easy  Extended ed done on wearing of oval 8 splint to 5th digit for PIP extention during day and to use with HEP for 5th digit adduction exercises.  Reviewed soft light blue theraputty exercises with MIN cues for add of 5th and 4th - digit adduction exercises L hand on tabletop, completed 3 set x 8 reps each  -also for pt to use putty - and with oval 8 on 5th - do pushing and pulling with digits ext and ADD in putty - needed mod v./c for quality more than quantity Add to HEP    PATIENT EDUCATION: Education details: findings of eval and HEP  Person educated: Patient Education method: Explanation, Demonstration, Tactile cues, Verbal cues, and Handouts Education comprehension: verbalized understanding, returned demonstration, verbal cues required, and needs further education    GOALS: Goals reviewed with patient? Yes  SHORT TERM GOALS: Target date: 2 wks   Patient to be independent in wearing as well as donning and doffing nighttime extension gutter splint for fourth PIP as well as RMS for daytime  use to facilitate decrease hyperextension of fifth MC and increase extension facilitation at PIP to less than -30 degrees. Baseline: At rest PIP extension of the fifth on the left -60 with hyperextension of the MCP.;  Attempt to fabricate RMS but patient continued to do adduction of 4th and 5th as well as fifth putting forth into hyperextension.  Held will include next session 3rd through 5th and splint.  Patient was fitted with a buddy strap for 2nd and 4th third middle phalanges and PIP extension gutter splint for nighttime. Goal  status: INITIAL   LONG TERM GOALS: Target date: 8 weeks  Left wrist active range of motion strength in all range improved to within normal limits for patient to push and pull door and turn doorknob symptom-free Baseline: Trouble with turning a doorknob using fifth digit as well as unable to push and pull heavy door because of decreased wrist flexion and pain and discomfort at wrist Goal status: INITIAL  2.  Left fifth digit extension improve to less than -15 degrees of PIP extension for patient to be able to put hand in pocket and donning gloves Baseline: Patient rife with hyperextension of the fifth metacarpal and extension of PIP -60 degrees Goal status: INITIAL  3.  Adduction of left 4th and 5th digit increase from patient to maintain adduction to be able to put hand in pocket and donning gloves symptom-free Baseline: Fifth digit rests in hyperextension at the MCP 60 degrees PIP flexion and abduction.  Compensate with flexion to adduct as well as pulling fourth digit and abduction. Goal status: INITIAL  4.  Bilateral lateral 3-point pinch improve with 1 to 3 pounds for patient to be able to open Ziploc bags, carry plate and do buttons but more ease Baseline: Grip strength: Right: 32 lbs; Left: 15 with pain  lbs, Lateral pinch: Right: 4 lbs, Left: 2 lbs, and 3 point pinch: Right: 3 lbs, Left: 2 middle finger only lbs Goal status: INITIAL  5.  Grip strength  improved in left hand with more than 5 to 8 pounds for patient to be able to carry half a gallon and left hand symptom-free Baseline: Right grip 32 pounds left 15 pounds with pain on the lateral ulnar side of the hand Goal status: INITIAL   ASSESSMENT:  CLINICAL IMPRESSION: Patient seen for occupational therapy for left nondisplaced fracture of fifth base of metacarpal as well as left wrist sprain.  Injury occurred with a fall around 04/29/2024.  Patient was immobilize in ulnar gutter splint.  Patient present at evaluation with decreased wrist flexion more than extension.  Pain at ulnar wrist.  Patient with hyperextension of the fifth metacarpal and PIP extension lacking -60 degrees.    NOW: Pt presents with improved 5th digit extention at PIP - pt cont to wear buddy strap to 2nd and 3rd - and reviewed again with pt wearing oval 8 splint on 5th for PIP extention during day and with 5th digit adduction exercises. Reviewed soft light blue theraputty exercises with MIN cue for ADD of 5th - and add this date digits ext with ADD for pulling and pushing for strengthening - needed mod V/c - Upgrade for pt to only do resisted clip of 4 lbs- 2 lbs one was to easy - pt to wear night time PIP extention gutty splint -modification made - to prevent it from sliding off -  Patient limited in functional use of left hand in ADLs and IADLs related to old radius fracture on the left and inability to flex second digit at PIP DIP during grasping or lateral pinch or 3-point pinch.  Patient can benefit from skilled OT services to decrease pain increase motion, decrease hyperextension of the fifth metacarpal increase PIP extension-fabrication of appropriate splints to facilitate correct motion and decrease risk for contractures.  Increasing strength for patient to return to prior level of function or be independent in use of left hand in ADLs and IADLs.  PERFORMANCE DEFICITS: in functional skills including ADLs, IADLs, ROM,  strength, pain, flexibility, decreased knowledge of use  of DME, and UE functional use,   and psychosocial skills including environmental adaptation and routines and behaviors.   IMPAIRMENTS: are limiting patient from ADLs, IADLs, rest and sleep, play, leisure, and social participation.   COMORBIDITIES: has no other co-morbidities that affects occupational performance. Patient will benefit from skilled OT to address above impairments and improve overall function.  MODIFICATION OR ASSISTANCE TO COMPLETE EVALUATION: No modification of tasks or assist necessary to complete an evaluation.  OT OCCUPATIONAL PROFILE AND HISTORY: Problem focused assessment: Including review of records relating to presenting problem.  CLINICAL DECISION MAKING: LOW - limited treatment options, no task modification necessary  REHAB POTENTIAL: Good for goals  EVALUATION COMPLEXITY: Low      PLAN:  OT FREQUENCY: 1-2x/week  OT DURATION: 8 weeks  PLANNED INTERVENTIONS: 97168 OT Re-evaluation, 97535 self care/ADL training, 02889 therapeutic exercise, 97530 therapeutic activity, 97112 neuromuscular re-education, 97140 manual therapy, 97035 ultrasound, 97018 paraffin, 02960 fluidotherapy, 97034 contrast bath, 97760 Orthotic Initial, [id number] Orthotic/Prosthetic subsequent, passive range of motion, patient/family education, and DME and/or AE instructions    CONSULTED AND AGREED WITH PLAN OF CARE: Patient     Ancel Peters, OTR/L 07/02/2024, 1:21 PM

## 2024-07-10 NOTE — Progress Notes (Signed)
 1. Vitreomacular traction syndrome of both eyes   2. Type 2 diabetes mellitus with diabetic neuropathy, unspecified whether long term insulin use (CMS/HHS-HCC)   3. Macular hole, right eye   4. Epiretinal membrane (ERM) of right eye   5. Vitreomacular adhesion of left eye   6. Ocular rosacea   7. Dry eye syndrome of both eyes    The Chief Complaint, HPI, ROS, and PFSHx as documented were reviewed with the patient and updated as appropriate.  73 y.o. female referred  for evaluation of FTMH OD  Last Seen: 11/15/2023 Nicole Bass  Here today for: - Follow up in 14 weeks for [facility] OU with Dr. Skeet Barnie JAYSON Nadyne 73 y.o.  Last Seen: 02/28/24 Nicole Bass   Here today for: Exam / OCT / DFE OD   Patient states that vision has been up and down since last visit. Has increase in photophobia either eye since last visit. No pain or discomfort in either eye. No new flashes or floaters either eye since last visit.  Ocular Medication: +Prednisolone  2/0  +Ketorolac  2/0  +Dorzolamide  2/0  +Pataday BID OU  Denies any discomfort with present eye drops.  Type 2 DM Last A1c: Lab Results      Component                Value               Date                      HGBA1C                   6.0 (H)             03/13/2024                HGBA1C                   6.4 (H)             11/28/2023           Last fasting blood sugar: doesn't check   Patient appears to be in good health with no additional concerns.  POH Phaco OS 07/27/21 (distance, lensx) Phaco OD 07/06/21 (lensx, monofocal distance) with dextenza  DES OU VMT OU FTMH OD T2DM without retinopathy Choroidal nevus OD  PMH T2DM Kidney stones H/O RA, Crohn's disease, CAD, CHF, DM, rosacea On Xeljanz  (crypto pulmonary disease, back on), chronic steroids Hx back surgery, pinched nerve lower back (treating medically)  Today 07/10/2024: On exam:. [facility] [place] 20/100-2, 20/64 with mrx OD, 20/25-1 NI mrx OS.  IOP: 10 OD, 11 OS.  Slit lamp examination: OD  PCIOL; OS PCIOL. On extended ophthalmoscopy (with the 90 and 28D lens): OD FTMH not seen, flat choroidal nevus inferiorly, CR scars, OS CR scars peripherally (not examined).  OCT  NI:ynoz closed with outer retinal lucency about the same, ERM OS: VMT and small ERM, stable  Assessment/Plan:  s/p 25gPPV/ICG/MP/ILMp/temporal inverted flap/FAX/18% C3F8 OD 09/08/22 (Iftikhar/Jaffe) Hole is closed. Outer retinal lucency about the same attached. [facility] 20/100 improved to 20/64 with refraction with PH IOP: 10/11  I would like to continue medical intervention given continued improvement.  Plan: Cont PF QID Cont Ketorolac  QID Cont Dorzolamide  TID - Positioning: none  - Return precautions reviewed - Follow up in 18 weeks for DFE OD with Dr. Skeet

## 2024-07-15 ENCOUNTER — Ambulatory Visit: Admitting: Occupational Therapy

## 2024-07-15 DIAGNOSIS — M79642 Pain in left hand: Secondary | ICD-10-CM | POA: Insufficient documentation

## 2024-07-15 DIAGNOSIS — M25642 Stiffness of left hand, not elsewhere classified: Secondary | ICD-10-CM | POA: Diagnosis present

## 2024-07-15 DIAGNOSIS — M6281 Muscle weakness (generalized): Secondary | ICD-10-CM | POA: Diagnosis present

## 2024-07-15 DIAGNOSIS — M25532 Pain in left wrist: Secondary | ICD-10-CM | POA: Insufficient documentation

## 2024-07-15 NOTE — Therapy (Signed)
 OUTPATIENT OCCUPATIONAL THERAPY ORTHO TREATMENT  Patient Name: Nicole Bass MRN: [id number] DOB:29-Dec-1950, 73 y.o., female Today's Date: 07/15/2024  PCP: DR Auston REFERRING PROVIDER: Dr Edie  END OF SESSION:  OT End of Session - 07/15/24 1527     Visit Number 5    Number of Visits 12    Date for Recertification  08/13/24    OT Start Time 1523    OT Stop Time 1558    OT Time Calculation (min) 35 min    Activity Tolerance Patient tolerated treatment well    Behavior During Therapy [facility] for tasks assessed/performed            Past Medical History:  Diagnosis Date   Anxiety    CAD (coronary artery disease)    a.) R/LHC 12/27/2022: 15% p-mLAD --> med mgmt; mRA 6, mPA 19, mPCWP 10, AO sat 97%, PA sat 68%, CO 6.5, CI 3.7   Chronic dyspnea    Crohn's disease (HCC)    DDD (degenerative disc disease), cervical 2003   DDD (degenerative disc disease), lumbar    Depression    Diastolic dysfunction 05/27/2022   a.) TTE 05/27/2022: EF 60-65%, no RWMAs, G1DD, norm RVSF, no valvulopathies   Diet-controlled type 2 diabetes mellitus with peripheral neuropathy    Fatty liver    GERD (gastroesophageal reflux disease)    Hand weakness    left hand worse, small tremors   Histoplasmosis 2022   History of chicken pox    History of claustrophobia    History of kidney stones    History of shingles    HTN (hypertension)    Hyperthyroidism    IDA (iron deficiency anemia)    Iron deficiency anemia    Left-sided sensorineural hearing loss    Long term current use of immunosuppressive drug    a.) tofacitinib  for RA + prednisone    Long term current use of systemic steroids    a.) prednisone  for RA   Lung nodule    Lymphatic disorder    Macular hole of right eye    Meniere's disease    Mixed hyperlipidemia    Motion sickness    Nasal fracture 03/27/2020   due to fall   Neuropathy involving both lower extremities    OSA on CPAP    Osteoarthritis of both knees    Osteoporosis     Peripheral neuropathy    Pneumonia    Pyelonephritis    RA (rheumatoid arthritis) (HCC)    a.) Tx'd with tofacitinib  + prednisone    Restless leg syndrome    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HCC)    Venous insufficiency    a.) s/p superficial vein ablation 10/08/2021   Vitamin B 12 deficiency    Wears dentures    upper dentures   Past Surgical History:  Procedure Laterality Date   ABDOMINAL HYSTERECTOMY  2005   APPENDECTOMY     BACK SURGERY     x 3   CATARACT EXTRACTION W/ INTRAOCULAR LENS IMPLANT Right 07/06/2021   CATARACT EXTRACTION W/ INTRAOCULAR LENS IMPLANT Left 07/27/2021   CERVICAL SPINE SURGERY     metal plate   CESAREAN SECTION  1976 & 1989   X 2   CHOLECYSTECTOMY     CLOSED REDUCTION NASAL FRACTURE N/A 04/09/2020   Procedure: CLOSED REDUCTION NASAL FRACTURE;  Surgeon: Edda Mt, MD;  Location: [facility];  Service: ENT;  Laterality: N/A;   COLON SURGERY  1999   removed 18 inches of colon,removed appendix   COLONOSCOPY  1998, 2001, 2004, 2008, 2009, 2010, 2014, 2018   COLONOSCOPY WITH PROPOFOL  N/A 08/02/2021   Procedure: COLONOSCOPY WITH PROPOFOL ;  Surgeon: Maryruth Ole DASEN, MD;  Location: [facility] ENDOSCOPY;  Service: Endoscopy;  Laterality: N/A;   dental implants     3 teeth/ wears dentures   DILATION AND CURETTAGE OF UTERUS  2004   ELBOW FRACTURE SURGERY Left 2008   ESOPHAGOGASTRODUODENOSCOPY     1998, 2001, 2004, 2008, 2009, 2010, 2014   FIBEROPTIC BRONCHOSCOPY N/A 06/01/2020   Procedure: BEDSIDE BRONCHOSCOPY FIBEROPTIC;  Surgeon: Parris Manna, MD;  Location: [facility] ORS;  Service: Thoracic;  Laterality: N/A;   FRACTURE SURGERY     HALLUX VALGUS CORRECTION Right 12/27/2012   JOINT REPLACEMENT Right 2015   partial knee replacement   KYPHOPLASTY     KYPHOPLASTY N/A 04/14/2015   Procedure: KYPHOPLASTY;  Surgeon: Ozell Flake, MD;  Location: [facility];  Service: Orthopedics;  Laterality: N/A;   KYPHOPLASTY N/A 06/04/2015   Procedure: KYPHOPLASTY  L-5;  Surgeon: Ozell Flake, MD;  Location: [facility] OR[facility];  Service: Orthopedics;  Laterality: N/A;   KYPHOPLASTY     LUMBAR LAMINECTOMY FOR EPIDURAL ABSCESS N/A 10/12/2020   Procedure: SYNOVIAL CYST RESECTION;  Surgeon: Bluford Standing, MD;  Location: [facility] OR[facility];  Service: Neurosurgery;  Laterality: N/A;   MAXIMUM ACCESS (MAS) TRANSFORAMINAL LUMBAR INTERBODY FUSION (TLIF) 1 LEVEL Left 10/12/2020   Procedure: OPEN L5/S1 TRANSFORAMINAL LUMBAR INTERBODY FUSION (TLIF) 1 LEVEL;  Surgeon: Bluford Standing, MD;  Location: [facility];  Service: Neurosurgery;  Laterality: Left;   MEDIAL PARTIAL KNEE REPLACEMENT Right 2015   NECK/PLATE  7997   OOPHORECTOMY Bilateral    ORIF NASAL FRACTURE N/A 04/09/2020   Procedure: OPEN REDUCTION INTERNAL FIXATION (ORIF) NASAL FRACTURE;  Surgeon: Edda Mt, MD;  Location: [facility];  Service: ENT;  Laterality: N/A;   PARS PLANA VITRECTOMY Right 09/08/2022   REPAIR EXTENSOR TENDON Left 01/08/2019   Procedure: Realignment  EXTENSOR TENDON;  Surgeon: Flake Ozell, MD;  Location: [facility] OR[facility];  Service: Orthopedics;  Laterality: Left;   REVERSE SHOULDER ARTHROPLASTY Right 04/18/2019   Procedure: REVERSE SHOULDER ARTHROPLASTY;  Surgeon: Edie Norleen PARAS, MD;  Location: [facility];  Service: Orthopedics;  Laterality: Right;   RIGHT/LEFT HEART CATH AND CORONARY ANGIOGRAPHY Bilateral 12/27/2022   Procedure: RIGHT/LEFT HEART CATH AND CORONARY ANGIOGRAPHY;  Surgeon: Mady Bruckner, MD;  Location: [facility] IN[facility];  Service: Cardiovascular;  Laterality: Bilateral;   SHOULDER ARTHROSCOPY WITH ROTATOR CUFF REPAIR AND SUBACROMIAL DECOMPRESSION Right 04/13/2016   Procedure: SHOULDER ARTHROSCOPY WITH ROTATOR CUFF REPAIR AND SUBACROMIAL DECOMPRESSION, release of long head biceps tendon;  Surgeon: Helayne Glenn, MD;  Location: [facility];  Service: Orthopedics;  Laterality: Right;   TOE SURGERY Right 2013   first MTP osteotomy   TONSILLECTOMY  1995   UPPER GI ENDOSCOPY     WRIST FRACTURE  SURGERY Bilateral 2016   Patient Active Problem List   Diagnosis Date Noted   At high risk for injury related to fall 01/05/2023   Bronchiectasis (HCC) 01/05/2023   Hypoxia 01/05/2023   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   Volume depletion 01/05/2023   Weakness 12/07/2022   Elevated troponin 12/07/2022   Sepsis secondary to UTI (HCC) 12/07/2022   Near syncope 12/07/2022   Hypotension 12/06/2022   HTN (hypertension) 12/06/2022   Severe sepsis (HCC) 12/06/2022   UTI (urinary tract infection) 12/06/2022   Left wrist fracture 12/06/2022   RA (rheumatoid arthritis) (HCC) 12/06/2022   Myocardial injury 12/06/2022   Acute renal failure superimposed on  stage 3a chronic kidney disease (HCC) 12/06/2022   Hypophosphatemia 12/06/2022   Lung nodule 12/06/2022   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   Diabetes mellitus type 2 without retinopathy (HCC) 08/03/2022   Epiretinal membrane (ERM) of right eye 08/03/2022   Pseudophakia of both eyes 08/03/2022   Vitreomacular adhesion of left eye 08/03/2022   Vitreomacular traction syndrome of both eyes 08/03/2022   AKI (acute kidney injury) 06/03/2022   Acute hypoxemic respiratory failure (HCC) 06/03/2022   Gastroesophageal reflux disease without esophagitis 05/11/2022   Macular cyst, hole, or pseudohole, right eye 05/11/2022   OSA (obstructive sleep apnea) 05/11/2022   Varicose veins of both lower extremities with pain 04/19/2022   Lymphedema 04/19/2022   Mixed hyperlipidemia 02/23/2022   Rotator cuff tendinitis, left 12/04/2020   Syncope 11/09/2020   Normocytic anemia 11/09/2020   Hypomagnesemia 11/09/2020   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 11/09/2020   Chronic diastolic CHF (congestive heart failure) (HCC) 11/09/2020   Cryptococcus (HCC) 10/25/2020   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   Fusion of lumbar spine 10/17/2020   Lumbar stenosis 10/12/2020   Swelling of limb 06/02/2020   Cervical spondylosis  05/01/2020   Atherosclerosis of native coronary artery of native heart without angina pectoris 04/27/2020   Numbness and tingling in left hand 10/29/2019   Status post reverse total shoulder replacement, 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   Status post right partial knee replacement 02/12/2019   Rotator cuff arthropathy, right 01/25/2019   Iron deficiency anemia 09/13/2018   Anemia, unspecified 09/09/2018   Iron deficiency 09/04/2018   Acute kidney injury 08/23/2018   HTN, goal below 140/80 06/04/2018   Hyperthyroidism 02/08/2018   Imbalance 12/18/2017   Stomatitis, ulcerative 09/06/2017   Dyspareunia in female 08/22/2017   Vaginal atrophy 08/22/2017   Obesity (BMI 30.0-34.9) 08/22/2017   Varicose veins of leg with pain, left 04/05/2017   Peripheral polyneuropathy 04/05/2017   Lumbar radiculopathy 04/05/2017   Tremor 02/13/2017   Sensory neuropathy 02/13/2017   Restless leg syndrome 02/13/2017   Impingement syndrome of left shoulder 09/29/2016   Acute diarrhea 09/06/2016   History of pyelonephritis 09/06/2016   Hypokalemia 09/06/2016   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   Pyelonephritis 08/29/2016   Rheumatoid arthritis, unspecified (HCC) 08/29/2016   Nontraumatic complete tear of right rotator cuff 03/08/2016   Acute pain of right shoulder 01/27/2016   Type 2 diabetes mellitus with diabetic neuropathy (HCC) 10/27/2015   Closed fracture of distal end of right radius with routine healing 09/17/2015   Crohn's colitis (HCC) 09/04/2015   Acute bilateral low back pain without sciatica 03/23/2015   Dyspnea 01/15/2015   Pedal edema 01/15/2015   Status post open reduction with internal fixation of fracture 11/27/2014   Aftercare for healing traumatic fracture of lower arm 09/25/2014   Painful rib 09/11/2014   Primary osteoarthritis of left knee 04/24/2014   Osteoporosis 03/17/2014   Depression with anxiety 03/17/2014   B12 deficiency 03/17/2014   Crohn's disease, unspecified, without  complications (HCC) 03/17/2014   Urolith 07/16/2013   Right flank pain 07/16/2013   Abdominal pain 07/16/2013    ONSET DATE: 04/28/24  REFERRING DIAG: Nondisplaced fracture of left fifth digit base of metacarpal and left wrist sprain.  THERAPY DIAG:  Stiffness of left hand, not elsewhere classified  Pain in left hand  Muscle weakness (generalized)  Pain in left wrist  Rationale for Evaluation and Treatment: Rehabilitation  SUBJECTIVE:   SUBJECTIVE STATEMENT: I have seen DR Poggi today -I told him I  do not know if it is arthritis that I have in my pinky.  Because it looks the same then my right 1.  I am doing the exercises would like I told him some of that is hard Pt accompanied by: self  PERTINENT HISTORY:  Dr Edie 04/29/24: The left wrist/hand x-rays demonstrated a nondisplaced fracture involving the base of the fifth metacarpal, as well as postsurgical changes resulting from a prior ORIF of a left distal radius fracture performed by Dr. Francisco several years ago. The patient was placed into an ulnar gutter splint and advised to follow-up with orthopedics.   Dr Edie 05/27/24: MKAYLA STEELE is a 73 y.o. female who presents for follow-up of a nondisplaced extra-articular fracture of the left fifth metacarpal, as well as left wrist sprain and left shoulder sprain injuries incurred as a result of a fall 1 month ago. On today's visit, she rates her pain at 2/10 in both the left wrist/hand as well as in the left shoulder.  She does note some residual weakness and discomfort in the wrist, but has already began resuming her normal daily activities. She will be referred to occupational therapy for progressive range of motion and strength exercises in order to optimize her overall function. Regarding her right shoulder symptoms, she does not feel that they are sufficiently severe as to require more aggressive treatment for the nonunited scapular spine fracture. I have recommended that she  continue with her normal daily activities and home exercises, but to avoid offending activities. She may take over-the-counter medications as needed for discomfort. All of the patient's questions and concerns were answered. She can call any time with further concerns. She will follow up in 6 to 8 weeks for re-evaluation   PRECAUTIONS: None  RED FLAGS: None   WEIGHT BEARING RESTRICTIONS: No  PAIN:  Are you having pain?  Pt did not report any pain in session  FALLS: Has patient fallen in last 6 months? Yes. Number of falls 1  LIVING ENVIRONMENT: Lives with: lives with their family and lives with their spouse  PLOF: Independent Difficulty with IADLs (cutting, picking up items, and turning pages) loves to do scrapbook , also doing her church bulletin  board and pictures   PATIENT GOALS: I want my pinkie straight and more strength in my hand and wrist   NEXT MD VISIT: ?  OBJECTIVE:  Note: Objective measures were completed at Evaluation unless otherwise noted.  HAND DOMINANCE: Right  ADLs: WFL  FUNCTIONAL OUTCOME MEASURES: PRWHE pain and function  UPPER EXTREMITY ROM:     Active ROM Right eval Left eval  Shoulder flexion    Shoulder abduction    Shoulder adduction    Shoulder extension    Shoulder internal rotation    Shoulder external rotation    Elbow flexion    Elbow extension    Wrist flexion  48  Wrist extension  70  Wrist ulnar deviation  30  Wrist radial deviation  20  Wrist pronation  90  Wrist supination  90  (Blank rows = not tested) Pain with resisted radial deviation on ulnar wrist  Active ROM Right eval Left eval  Thumb MCP (0-60)    Thumb IP (0-80)    Thumb Radial abd/add (0-55)     Thumb Palmar abd/add (0-45)     Thumb Opposition to Small Finger     Index MCP (0-90)     Index PIP (0-100)     Index DIP (0-70)  Long MCP (0-90)      Long PIP (0-100)      Long DIP (0-70)      Ring MCP (0-90)      Ring PIP (0-100)      Ring DIP (0-70)       Little MCP (0-90)    hyperextension  Little PIP (0-100)   -60  ext  Little DIP (0-70)      (Blank rows = not tested)   HAND FUNCTION: Grip strength: Right: 32 lbs; Left: 15 with pain  lbs, Lateral pinch: Right: 4 lbs, Left: 2 lbs, and 3 point pinch: Right: 3 lbs, Left: 2 middle finger only lbs 06/28/24: Grip strength: Right: 40 lbs; Left: 24 lbs, Lateral pinch: Right: 4 lbs, Left: 2 lbs, and 3 point pinch: Right: 4 lbs, Left: 1 lb  07/15/24: Grip strength: Right: 40 lbs; Left: 24 lbs, Lateral pinch: Right: 4 lbs, Left: 2 lbs, and 3 point pinch: Right: 4 lbs, Left: 2  lb COORDINATION: Impaired -  06/28/24: R 28 sec, L 46 sec  SENSATION: WFL  EDEMA: none  COGNITION: Overall cognitive status: Within functional limits for tasks assessed      TREATMENT DATE: 07/15/24                                                                                                                            Patient report has been wearing her night splint PIP extension gutter splint. Patient seen Dr. Today -over the holidays coming up and is busy that she may be can do it at home. Feels like she has some arthritis in the pinky as well as the middle finger on the right hand. On the left pinky is starting to look like the right. If can review some of her home exercises that she can do that for a few weeks.  Follow-up may be.  Reinforced use of buddy strap wearing for 2nd and 3rd digit at middle phalanges - and to use with 3-point pinch and light blue putty Patient did show increased strength see flowsheet Also added for her lateral pinch.  Only has 4 pounds of lateral pinch. Patient needed moderate verbal cueing and tactile cueing to do correctly  Extended ed done on wearing of oval 8 splint to 5th digit for PIP extention during day and to use with HEP for 5th digit adduction exercises.  Reviewed soft light blue theraputty exercises with MIN cues for add of 5th and 4th - digit adduction exercises  L hand on tabletop, completed 3 set x 8 reps each  -also for pt to use putty - and with oval 8 on 5th - do pushing and pulling with digits ext and ADD in putty - needed mod v./c for quality more than quantity Patient to continue with home program for 3 to 4 weeks and follow-up in early January to reassess.  If she continues to show progress with home exercises   PATIENT EDUCATION: Education  details: findings of eval and HEP  Person educated: Patient Education method: Explanation, Demonstration, Tactile cues, Verbal cues, and Handouts Education comprehension: verbalized understanding, returned demonstration, verbal cues required, and needs further education    GOALS: Goals reviewed with patient? Yes  SHORT TERM GOALS: Target date: 2 wks   Patient to be independent in wearing as well as donning and doffing nighttime extension gutter splint for fourth PIP as well as RMS for daytime use to facilitate decrease hyperextension of fifth MC and increase extension facilitation at PIP to less than -30 degrees. Baseline: At rest PIP extension of the fifth on the left -60 with hyperextension of the MCP.;  Attempt to fabricate RMS but patient continued to do adduction of 4th and 5th as well as fifth putting forth into hyperextension.  Held will include next session 3rd through 5th and splint.  Patient was fitted with a buddy strap for 2nd and 4th third middle phalanges and PIP extension gutter splint for nighttime. Goal status: INITIAL   LONG TERM GOALS: Target date: 8 weeks  Left wrist active range of motion strength in all range improved to within normal limits for patient to push and pull door and turn doorknob symptom-free Baseline: Trouble with turning a doorknob using fifth digit as well as unable to push and pull heavy door because of decreased wrist flexion and pain and discomfort at wrist Goal status: INITIAL  2.  Left fifth digit extension improve to less than -15 degrees of PIP extension for  patient to be able to put hand in pocket and donning gloves Baseline: Patient rife with hyperextension of the fifth metacarpal and extension of PIP -60 degrees Goal status: INITIAL  3.  Adduction of left 4th and 5th digit increase from patient to maintain adduction to be able to put hand in pocket and donning gloves symptom-free Baseline: Fifth digit rests in hyperextension at the MCP 60 degrees PIP flexion and abduction.  Compensate with flexion to adduct as well as pulling fourth digit and abduction. Goal status: INITIAL  4.  Bilateral lateral 3-point pinch improve with 1 to 3 pounds for patient to be able to open Ziploc bags, carry plate and do buttons but more ease Baseline: Grip strength: Right: 32 lbs; Left: 15 with pain  lbs, Lateral pinch: Right: 4 lbs, Left: 2 lbs, and 3 point pinch: Right: 3 lbs, Left: 2 middle finger only lbs Goal status: INITIAL  5.  Grip strength improved in left hand with more than 5 to 8 pounds for patient to be able to carry half a gallon and left hand symptom-free Baseline: Right grip 32 pounds left 15 pounds with pain on the lateral ulnar side of the hand Goal status: INITIAL   ASSESSMENT:  CLINICAL IMPRESSION: Patient seen for occupational therapy for left nondisplaced fracture of fifth base of metacarpal as well as left wrist sprain.  Injury occurred with a fall around 04/29/2024.  Patient was immobilize in ulnar gutter splint.  Patient present at evaluation with decreased wrist flexion more than extension.  Pain at ulnar wrist.  Patient with hyperextension of the fifth metacarpal and PIP extension lacking -60 degrees.    NOW: Pt presents with improved 5th digit extention at PIP - pt cont to wear buddy strap to 2nd and 3rd - and reviewed again with pt wearing oval 8 splint on 5th for PIP extention during day and with 5th digit adduction exercises. Reviewed soft light blue theraputty exercises with MIN cue for ADD of 5th -and  digits ext with ADD for pulling  and pushing for strengthening - needed mod V/c -add for patient 3 point and lateral pinch and 2 light blue putty.  Patient only has 2 pounds of 3-point pinch and 4 pounds of lateral pinch.- pt to wear night time PIP extention gutty splint - -  Patient limited in functional use of left hand in ADLs and IADLs related to old radius fracture on the left and inability to flex second digit at PIP DIP during grasping or lateral pinch or 3-point pinch.  Patient can benefit from skilled OT services to decrease pain increase motion, decrease hyperextension of the fifth metacarpal increase PIP extension-patient to continue with home program for 3 to 4 weeks and will reassess if still continue to improve.  Can benefit also from OT to increasing strength for patient to return to prior level of function or be independent in use of left hand in ADLs and IADLs.  PERFORMANCE DEFICITS: in functional skills including ADLs, IADLs, ROM, strength, pain, flexibility, decreased knowledge of use of DME, and UE functional use,   and psychosocial skills including environmental adaptation and routines and behaviors.   IMPAIRMENTS: are limiting patient from ADLs, IADLs, rest and sleep, play, leisure, and social participation.   COMORBIDITIES: has no other co-morbidities that affects occupational performance. Patient will benefit from skilled OT to address above impairments and improve overall function.  MODIFICATION OR ASSISTANCE TO COMPLETE EVALUATION: No modification of tasks or assist necessary to complete an evaluation.  OT OCCUPATIONAL PROFILE AND HISTORY: Problem focused assessment: Including review of records relating to presenting problem.  CLINICAL DECISION MAKING: LOW - limited treatment options, no task modification necessary  REHAB POTENTIAL: Good for goals  EVALUATION COMPLEXITY: Low      PLAN:  OT FREQUENCY: 1-2x/week  OT DURATION: 8 weeks  PLANNED INTERVENTIONS: 97168 OT Re-evaluation, 97535 self  care/ADL training, 02889 therapeutic exercise, 97530 therapeutic activity, 97112 neuromuscular re-education, 97140 manual therapy, 97035 ultrasound, 97018 paraffin, 02960 fluidotherapy, 97034 contrast bath, 97760 Orthotic Initial, [id number] Orthotic/Prosthetic subsequent, passive range of motion, patient/family education, and DME and/or AE instructions    CONSULTED AND AGREED WITH PLAN OF CARE: Patient     Ancel Peters, OTR/L, CLT 07/15/2024, 5:25 PM

## 2024-07-22 ENCOUNTER — Ambulatory Visit: Admitting: Occupational Therapy

## 2024-07-29 ENCOUNTER — Ambulatory Visit: Admitting: Occupational Therapy

## 2024-08-15 ENCOUNTER — Encounter: Payer: Self-pay | Admitting: Oncology

## 2024-08-19 ENCOUNTER — Ambulatory Visit: Attending: Surgery | Admitting: Occupational Therapy

## 2024-08-19 DIAGNOSIS — M25532 Pain in left wrist: Secondary | ICD-10-CM | POA: Diagnosis present

## 2024-08-19 DIAGNOSIS — M79642 Pain in left hand: Secondary | ICD-10-CM | POA: Insufficient documentation

## 2024-08-19 DIAGNOSIS — M6281 Muscle weakness (generalized): Secondary | ICD-10-CM | POA: Diagnosis present

## 2024-08-19 DIAGNOSIS — M25642 Stiffness of left hand, not elsewhere classified: Secondary | ICD-10-CM | POA: Diagnosis present

## 2024-08-19 NOTE — Therapy (Signed)
 " OUTPATIENT OCCUPATIONAL THERAPY ORTHO TREATMENT/RECERT  Patient Name: Nicole Bass MRN: [id number] DOB:1950/12/27, 74 y.o., female Today's Date: 08/19/2024  PCP: DR Auston REFERRING PROVIDER: Dr Edie  END OF SESSION:  OT End of Session - 08/19/24 1433     Visit Number 6    Number of Visits 8    Date for Recertification  10/07/24    OT Start Time 1430    OT Stop Time 1512    OT Time Calculation (min) 42 min    Activity Tolerance Patient tolerated treatment well    Behavior During Therapy [facility] for tasks assessed/performed            Past Medical History:  Diagnosis Date   Anxiety    CAD (coronary artery disease)    a.) R/LHC 12/27/2022: 15% p-mLAD --> med mgmt; mRA 6, mPA 19, mPCWP 10, AO sat 97%, PA sat 68%, CO 6.5, CI 3.7   Chronic dyspnea    Crohn's disease (HCC)    DDD (degenerative disc disease), cervical 2003   DDD (degenerative disc disease), lumbar    Depression    Diastolic dysfunction 05/27/2022   a.) TTE 05/27/2022: EF 60-65%, no RWMAs, G1DD, norm RVSF, no valvulopathies   Diet-controlled type 2 diabetes mellitus with peripheral neuropathy    Fatty liver    GERD (gastroesophageal reflux disease)    Hand weakness    left hand worse, small tremors   Histoplasmosis 2022   History of chicken pox    History of claustrophobia    History of kidney stones    History of shingles    HTN (hypertension)    Hyperthyroidism    IDA (iron deficiency anemia)    Iron deficiency anemia    Left-sided sensorineural hearing loss    Long term current use of immunosuppressive drug    a.) tofacitinib  for RA + prednisone    Long term current use of systemic steroids    a.) prednisone  for RA   Lung nodule    Lymphatic disorder    Macular hole of right eye    Meniere's disease    Mixed hyperlipidemia    Motion sickness    Nasal fracture 03/27/2020   due to fall   Neuropathy involving both lower extremities    OSA on CPAP    Osteoarthritis of both knees     Osteoporosis    Peripheral neuropathy    Pneumonia    Pyelonephritis    RA (rheumatoid arthritis) (HCC)    a.) Tx'd with tofacitinib  + prednisone    Restless leg syndrome    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HCC)    Venous insufficiency    a.) s/p superficial vein ablation 10/08/2021   Vitamin B 12 deficiency    Wears dentures    upper dentures   Past Surgical History:  Procedure Laterality Date   ABDOMINAL HYSTERECTOMY  2005   APPENDECTOMY     BACK SURGERY     x 3   CATARACT EXTRACTION W/ INTRAOCULAR LENS IMPLANT Right 07/06/2021   CATARACT EXTRACTION W/ INTRAOCULAR LENS IMPLANT Left 07/27/2021   CERVICAL SPINE SURGERY     metal plate   CESAREAN SECTION  1976 & 1989   X 2   CHOLECYSTECTOMY     CLOSED REDUCTION NASAL FRACTURE N/A 04/09/2020   Procedure: CLOSED REDUCTION NASAL FRACTURE;  Surgeon: Edda Mt, MD;  Location: [facility];  Service: ENT;  Laterality: N/A;   COLON SURGERY  1999   removed 18 inches of colon,removed appendix  COLONOSCOPY     1998, 2001, 2004, 2008, 2009, 2010, 2014, 2018   COLONOSCOPY WITH PROPOFOL  N/A 08/02/2021   Procedure: COLONOSCOPY WITH PROPOFOL ;  Surgeon: Maryruth Ole DASEN, MD;  Location: [facility];  Service: Endoscopy;  Laterality: N/A;   dental implants     3 teeth/ wears dentures   DILATION AND CURETTAGE OF UTERUS  2004   ELBOW FRACTURE SURGERY Left 2008   ESOPHAGOGASTRODUODENOSCOPY     1998, 2001, 2004, 2008, 2009, 2010, 2014   FIBEROPTIC BRONCHOSCOPY N/A 06/01/2020   Procedure: BEDSIDE BRONCHOSCOPY FIBEROPTIC;  Surgeon: Parris Manna, MD;  Location: [facility];  Service: Thoracic;  Laterality: N/A;   FRACTURE SURGERY     HALLUX VALGUS CORRECTION Right 12/27/2012   JOINT REPLACEMENT Right 2015   partial knee replacement   KYPHOPLASTY     KYPHOPLASTY N/A 04/14/2015   Procedure: KYPHOPLASTY;  Surgeon: Ozell Flake, MD;  Location: [facility];  Service: Orthopedics;  Laterality: N/A;   KYPHOPLASTY N/A 06/04/2015   Procedure:  KYPHOPLASTY L-5;  Surgeon: Ozell Flake, MD;  Location: [facility];  Service: Orthopedics;  Laterality: N/A;   KYPHOPLASTY     LUMBAR LAMINECTOMY FOR EPIDURAL ABSCESS N/A 10/12/2020   Procedure: SYNOVIAL CYST RESECTION;  Surgeon: Bluford Standing, MD;  Location: [facility];  Service: Neurosurgery;  Laterality: N/A;   MAXIMUM ACCESS (MAS) TRANSFORAMINAL LUMBAR INTERBODY FUSION (TLIF) 1 LEVEL Left 10/12/2020   Procedure: OPEN L5/S1 TRANSFORAMINAL LUMBAR INTERBODY FUSION (TLIF) 1 LEVEL;  Surgeon: Bluford Standing, MD;  Location: [facility];  Service: Neurosurgery;  Laterality: Left;   MEDIAL PARTIAL KNEE REPLACEMENT Right 2015   NECK/PLATE  7997   OOPHORECTOMY Bilateral    ORIF NASAL FRACTURE N/A 04/09/2020   Procedure: OPEN REDUCTION INTERNAL FIXATION (ORIF) NASAL FRACTURE;  Surgeon: Edda Mt, MD;  Location: [facility];  Service: ENT;  Laterality: N/A;   PARS PLANA VITRECTOMY Right 09/08/2022   REPAIR EXTENSOR TENDON Left 01/08/2019   Procedure: Realignment  EXTENSOR TENDON;  Surgeon: Flake Ozell, MD;  Location: [facility];  Service: Orthopedics;  Laterality: Left;   REVERSE SHOULDER ARTHROPLASTY Right 04/18/2019   Procedure: REVERSE SHOULDER ARTHROPLASTY;  Surgeon: Edie Norleen PARAS, MD;  Location: [facility];  Service: Orthopedics;  Laterality: Right;   RIGHT/LEFT HEART CATH AND CORONARY ANGIOGRAPHY Bilateral 12/27/2022   Procedure: RIGHT/LEFT HEART CATH AND CORONARY ANGIOGRAPHY;  Surgeon: Mady Bruckner, MD;  Location: [facility];  Service: Cardiovascular;  Laterality: Bilateral;   SHOULDER ARTHROSCOPY WITH ROTATOR CUFF REPAIR AND SUBACROMIAL DECOMPRESSION Right 04/13/2016   Procedure: SHOULDER ARTHROSCOPY WITH ROTATOR CUFF REPAIR AND SUBACROMIAL DECOMPRESSION, release of long head biceps tendon;  Surgeon: Helayne Glenn, MD;  Location: [facility];  Service: Orthopedics;  Laterality: Right;   TOE SURGERY Right 2013   first MTP osteotomy   TONSILLECTOMY  1995   UPPER GI ENDOSCOPY      WRIST FRACTURE SURGERY Bilateral 2016   Patient Active Problem List   Diagnosis Date Noted   At high risk for injury related to fall 01/05/2023   Bronchiectasis (HCC) 01/05/2023   Hypoxia 01/05/2023   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   Volume depletion 01/05/2023   Weakness 12/07/2022   Elevated troponin 12/07/2022   Sepsis secondary to UTI (HCC) 12/07/2022   Near syncope 12/07/2022   Hypotension 12/06/2022   HTN (hypertension) 12/06/2022   Severe sepsis (HCC) 12/06/2022   UTI (urinary tract infection) 12/06/2022   Left wrist fracture 12/06/2022   RA (rheumatoid arthritis) (HCC) 12/06/2022   Myocardial injury 12/06/2022  Acute renal failure superimposed on stage 3a chronic kidney disease (HCC) 12/06/2022   Hypophosphatemia 12/06/2022   Lung nodule 12/06/2022   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   Diabetes mellitus type 2 without retinopathy (HCC) 08/03/2022   Epiretinal membrane (ERM) of right eye 08/03/2022   Pseudophakia of both eyes 08/03/2022   Vitreomacular adhesion of left eye 08/03/2022   Vitreomacular traction syndrome of both eyes 08/03/2022   AKI (acute kidney injury) 06/03/2022   Acute hypoxemic respiratory failure (HCC) 06/03/2022   Gastroesophageal reflux disease without esophagitis 05/11/2022   Macular cyst, hole, or pseudohole, right eye 05/11/2022   OSA (obstructive sleep apnea) 05/11/2022   Varicose veins of both lower extremities with pain 04/19/2022   Lymphedema 04/19/2022   Mixed hyperlipidemia 02/23/2022   Rotator cuff tendinitis, left 12/04/2020   Syncope 11/09/2020   Normocytic anemia 11/09/2020   Hypomagnesemia 11/09/2020   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 11/09/2020   Chronic diastolic CHF (congestive heart failure) (HCC) 11/09/2020   Cryptococcus (HCC) 10/25/2020   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   Fusion of lumbar spine 10/17/2020   Lumbar stenosis 10/12/2020   Swelling of limb 06/02/2020   Cervical  spondylosis 05/01/2020   Atherosclerosis of native coronary artery of native heart without angina pectoris 04/27/2020   Numbness and tingling in left hand 10/29/2019   Status post reverse total shoulder replacement, 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   Status post right partial knee replacement 02/12/2019   Rotator cuff arthropathy, right 01/25/2019   Iron deficiency anemia 09/13/2018   Anemia, unspecified 09/09/2018   Iron deficiency 09/04/2018   Acute kidney injury 08/23/2018   HTN, goal below 140/80 06/04/2018   Hyperthyroidism 02/08/2018   Imbalance 12/18/2017   Stomatitis, ulcerative 09/06/2017   Dyspareunia in female 08/22/2017   Vaginal atrophy 08/22/2017   Obesity (BMI 30.0-34.9) 08/22/2017   Varicose veins of leg with pain, left 04/05/2017   Peripheral polyneuropathy 04/05/2017   Lumbar radiculopathy 04/05/2017   Tremor 02/13/2017   Sensory neuropathy 02/13/2017   Restless leg syndrome 02/13/2017   Impingement syndrome of left shoulder 09/29/2016   Acute diarrhea 09/06/2016   History of pyelonephritis 09/06/2016   Hypokalemia 09/06/2016   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   Pyelonephritis 08/29/2016   Rheumatoid arthritis, unspecified (HCC) 08/29/2016   Nontraumatic complete tear of right rotator cuff 03/08/2016   Acute pain of right shoulder 01/27/2016   Type 2 diabetes mellitus with diabetic neuropathy (HCC) 10/27/2015   Closed fracture of distal end of right radius with routine healing 09/17/2015   Crohn's colitis (HCC) 09/04/2015   Acute bilateral low back pain without sciatica 03/23/2015   Dyspnea 01/15/2015   Pedal edema 01/15/2015   Status post open reduction with internal fixation of fracture 11/27/2014   Aftercare for healing traumatic fracture of lower arm 09/25/2014   Painful rib 09/11/2014   Primary osteoarthritis of left knee 04/24/2014   Osteoporosis 03/17/2014   Depression with anxiety 03/17/2014   B12 deficiency 03/17/2014   Crohn's disease,  unspecified, without complications (HCC) 03/17/2014   Urolith 07/16/2013   Right flank pain 07/16/2013   Abdominal pain 07/16/2013    ONSET DATE: 04/28/24  REFERRING DIAG: Nondisplaced fracture of left fifth digit base of metacarpal and left wrist sprain.  THERAPY DIAG:  Stiffness of left hand, not elsewhere classified  Pain in left hand  Muscle weakness (generalized)  Pain in left wrist  Rationale for Evaluation and Treatment: Rehabilitation  SUBJECTIVE:   SUBJECTIVE STATEMENT: Been doing some of the  exercises.  My pinky is curling the other hand so I they tell me his arthritis.  My hand still feels weak like my grip and my pinches.  Like I got some magnets for my necklaces.  Shaving still hurt my leg Pt accompanied by: self  PERTINENT HISTORY:  Dr Edie 04/29/24: The left wrist/hand x-rays demonstrated a nondisplaced fracture involving the base of the fifth metacarpal, as well as postsurgical changes resulting from a prior ORIF of a left distal radius fracture performed by Dr. Francisco several years ago. The patient was placed into an ulnar gutter splint and advised to follow-up with orthopedics.   Dr Edie 05/27/24: SHANDEE JERGENS is a 74 y.o. female who presents for follow-up of a nondisplaced extra-articular fracture of the left fifth metacarpal, as well as left wrist sprain and left shoulder sprain injuries incurred as a result of a fall 1 month ago. On today's visit, she rates her pain at 2/10 in both the left wrist/hand as well as in the left shoulder.  She does note some residual weakness and discomfort in the wrist, but has already began resuming her normal daily activities. She will be referred to occupational therapy for progressive range of motion and strength exercises in order to optimize her overall function. Regarding her right shoulder symptoms, she does not feel that they are sufficiently severe as to require more aggressive treatment for the nonunited scapular spine  fracture. I have recommended that she continue with her normal daily activities and home exercises, but to avoid offending activities. She may take over-the-counter medications as needed for discomfort. All of the patient's questions and concerns were answered. She can call any time with further concerns. She will follow up in 6 to 8 weeks for re-evaluation   PRECAUTIONS: None  RED FLAGS: None   WEIGHT BEARING RESTRICTIONS: No  PAIN:  Are you having pain?  Pt did not report any pain in session  FALLS: Has patient fallen in last 6 months? Yes. Number of falls 1  LIVING ENVIRONMENT: Lives with: lives with their family and lives with their spouse  PLOF: Independent Difficulty with IADLs (cutting, picking up items, and turning pages) loves to do scrapbook , also doing her church bulletin  board and pictures   PATIENT GOALS: I want my pinkie straight and more strength in my hand and wrist   NEXT MD VISIT: ?  OBJECTIVE:  Note: Objective measures were completed at Evaluation unless otherwise noted.  HAND DOMINANCE: Right  ADLs: WFL  FUNCTIONAL OUTCOME MEASURES: PRWHE pain and function  UPPER EXTREMITY ROM:     Active ROM Right eval Left eval  Shoulder flexion    Shoulder abduction    Shoulder adduction    Shoulder extension    Shoulder internal rotation    Shoulder external rotation    Elbow flexion    Elbow extension    Wrist flexion  48  Wrist extension  70  Wrist ulnar deviation  30  Wrist radial deviation  20  Wrist pronation  90  Wrist supination  90  (Blank rows = not tested) Pain with resisted radial deviation on ulnar wrist  Active ROM Right eval Left eval  Thumb MCP (0-60)    Thumb IP (0-80)    Thumb Radial abd/add (0-55)     Thumb Palmar abd/add (0-45)     Thumb Opposition to Small Finger     Index MCP (0-90)     Index PIP (0-100)     Index  DIP (0-70)      Long MCP (0-90)      Long PIP (0-100)      Long DIP (0-70)      Ring MCP (0-90)       Ring PIP (0-100)      Ring DIP (0-70)      Little MCP (0-90)    hyperextension  Little PIP (0-100)   -60  ext  Little DIP (0-70)      (Blank rows = not tested)   HAND FUNCTION: Grip strength: Right: 32 lbs; Left: 15 with pain  lbs, Lateral pinch: Right: 4 lbs, Left: 2 lbs, and 3 point pinch: Right: 3 lbs, Left: 2 middle finger only lbs 06/28/24: Grip strength: Right: 40 lbs; Left: 24 lbs, Lateral pinch: Right: 4 lbs, Left: 2 lbs, and 3 point pinch: Right: 4 lbs, Left: 1 lb  07/15/24: Grip strength: Right: 40 lbs; Left: 24 lbs, Lateral pinch: Right: 4 lbs, Left: 2 lbs, and 3 point pinch: Right: 4 lbs, Left: 2  lb 08/19/24 : Grip strength: Right: 40 lbs; Left: 25 lbs, Lateral pinch: Right: 4 lbs, Left: 2 lbs, and 3 point pinch: Right: 5 lbs, Left: 3  lb COORDINATION: Impaired -  06/28/24: R 28 sec, L 46 sec  SENSATION: WFL  EDEMA: none  COGNITION: Overall cognitive status: Within functional limits for tasks assessed      TREATMENT DATE:  08/19/24                                                                                                                          Feels like she has some arthritis in the pinkies -causing them to curl up. Patient with better adduction of the digits maintaining extension Grip and prehension about the same on the left.  Still decreased. 3-point pinch did increase See flowsheet  Reinforced use of buddy strap wearing for 2nd and 3rd digit at middle phalanges on the left hand-to increase the ability to do 3-point pinch    Upgrade patient to teal medium putty exercises  - Review and focus on gripping with all digits but mostly 3rd through 5th on the left hand 20 reps But can do bilateral - Lateral pinch left and right 12-15 reps - 3-point pinch with a buddy strap on the left hand 15 reps Bilateral hands twice a day Can increase to second set in a week if pain-free and a third set in another week.  Also encouraged opposition picking up 1 cm  object for fine motor coordination.  Reviewed with patient some adaptive equipment and modifications like spring-loaded scissors for scrapbooking and a built-up handle provided or grip for her razor.   PATIENT EDUCATION: Education details: findings of eval and HEP  Person educated: Patient Education method: Explanation, Demonstration, Tactile cues, Verbal cues, and Handouts Education comprehension: verbalized understanding, returned demonstration, verbal cues required, and needs further education    GOALS: Goals reviewed with patient? Yes  SHORT TERM GOALS: Target  date: 2 wks   Patient to be independent in wearing as well as donning and doffing nighttime extension gutter splint for fourth PIP as well as RMS for daytime use to facilitate decrease hyperextension of fifth MC and increase extension facilitation at PIP to less than -30 degrees. Baseline: At rest PIP extension of the fifth on the left -60 with hyperextension of the MCP.;  Attempt to fabricate RMS but patient continued to do adduction of 4th and 5th as well as fifth putting forth into hyperextension.  Held will include next session 3rd through 5th and splint.  Patient was fitted with a buddy strap for 2nd and 4th third middle phalanges and PIP extension gutter splint for nighttime. Goal status: Partially   LONG TERM GOALS: Target date: 8 weeks  Left wrist active range of motion strength in all range improved to within normal limits for patient to push and pull door and turn doorknob symptom-free Baseline: Trouble with turning a doorknob using fifth digit as well as unable to push and pull heavy door because of decreased wrist flexion and pain and discomfort at wrist Goal status: Met  2.  Left fifth digit extension improve to less than -15 degrees of PIP extension for patient to be able to put hand in pocket and donning gloves Baseline: Patient rife with hyperextension of the fifth metacarpal and extension of PIP -60  degrees Goal status: Same then right hand possible arthritis  3.  Adduction of left 4th and 5th digit increase from patient to maintain adduction to be able to put hand in pocket and donning gloves symptom-free Baseline: Fifth digit rests in hyperextension at the MCP 60 degrees PIP flexion and abduction.  Compensate with flexion to adduct as well as pulling fourth digit and abduction. Goal status: Progressing  4.  Bilateral lateral 3-point pinch improve with 1 to 3 pounds for patient to be able to open Ziploc bags, carry plate and do buttons but more ease Baseline: Grip strength: Right: 32 lbs; Left: 15 with pain  lbs, Lateral pinch: Right: 4 lbs, Left: 2 lbs, and 3 point pinch: Right: 3 lbs, Left: 2 middle finger only lbs NOW Grip strength: Right: 40 lbs; Left: 25 lbs, Lateral pinch: Right: 4 lbs, Left: 2 lbs, and 3 point pinch: Right: 5 lbs, Left: 3  lb Goal status: Progressing  5.  Grip strength improved in left hand with more than 5 to 8 pounds for patient to be able to carry half a gallon and left hand symptom-free Baseline: Right grip 32 pounds left 15 pounds with pain on the lateral ulnar side of the hand NOW Grip strength: Right: 40 lbs; Left: 25 lbs, Goal status: Progressing   ASSESSMENT:  CLINICAL IMPRESSION: Patient seen for occupational therapy for left nondisplaced fracture of fifth base of metacarpal as well as left wrist sprain.  Injury occurred with a fall around 04/29/2024.  Patient was immobilize in ulnar gutter splint.  Patient present at evaluation with decreased wrist flexion more than extension.  Pain at ulnar wrist.  Patient with hyperextension of the fifth metacarpal and PIP extension lacking -60 degrees.    NOW: Pt presents with improved 5th digit extention at PIP - pt cont to wear buddy strap to 2nd and 3rd -patient has been doing home program for about a month.  Was able to maintain her grip and prehension. 3rd and 4th digits ext improvement in left hand as well as  adduction.  Patient limited in functional use of left hand  in ADLs and IADLs related to old radius fracture on the left and inability to flex second digit at PIP DIP during grasping or lateral pinch or 3-point pinch.  Patient mostly limited by grip and prehension strength.  Upgrade patient to med putty for gripping and prehension strength.  Patient to wear a buddy strap on the left 2nd and 3rd middle phalanges to be able to do 3-point pinch.  Patient to focus on strengthening and fine motor for 4 weeks and follow-up.  Patient denies pain.  Patient can benefit from skilled OT services to  increase motion and strength in left hand -patient to continue with upgrade home program for 3 to 4 weeks and will reassess if still continue to improve.  Can benefit also from OT to increasing strength for patient to return to prior level of function or be independent in use of left hand in ADLs and IADLs.  PERFORMANCE DEFICITS: in functional skills including ADLs, IADLs, ROM, strength, pain, flexibility, decreased knowledge of use of DME, and UE functional use,   and psychosocial skills including environmental adaptation and routines and behaviors.   IMPAIRMENTS: are limiting patient from ADLs, IADLs, rest and sleep, play, leisure, and social participation.   COMORBIDITIES: has no other co-morbidities that affects occupational performance. Patient will benefit from skilled OT to address above impairments and improve overall function.  MODIFICATION OR ASSISTANCE TO COMPLETE EVALUATION: No modification of tasks or assist necessary to complete an evaluation.  OT OCCUPATIONAL PROFILE AND HISTORY: Problem focused assessment: Including review of records relating to presenting problem.  CLINICAL DECISION MAKING: LOW - limited treatment options, no task modification necessary  REHAB POTENTIAL: Good for goals  EVALUATION COMPLEXITY: Low      PLAN:  OT FREQUENCY: 3 visits  OT DURATION: 8 weeks  PLANNED  INTERVENTIONS: 97168 OT Re-evaluation, 97535 self care/ADL training, 02889 therapeutic exercise, 97530 therapeutic activity, 97112 neuromuscular re-education, 97140 manual therapy, 97035 ultrasound, 97018 paraffin, 02960 fluidotherapy, 97034 contrast bath, 97760 Orthotic Initial, [id number] Orthotic/Prosthetic subsequent, passive range of motion, patient/family education, and DME and/or AE instructions    CONSULTED AND AGREED WITH PLAN OF CARE: Patient     Ancel Peters, OTR/L, CLT 08/19/2024, 4:32 PM   "

## 2024-09-20 ENCOUNTER — Ambulatory Visit: Admitting: Occupational Therapy
